# Patient Record
Sex: Male | Born: 1965 | Race: Black or African American | Hispanic: No | Marital: Married | Smoking: Never smoker
Health system: Southern US, Community
[De-identification: ages and names within clinical notes are randomized; demographics above are authoritative.]

## PROBLEM LIST (undated history)

## (undated) DIAGNOSIS — F32A Depression, unspecified: Secondary | ICD-10-CM

## (undated) DIAGNOSIS — E785 Hyperlipidemia, unspecified: Secondary | ICD-10-CM

## (undated) DIAGNOSIS — F329 Major depressive disorder, single episode, unspecified: Secondary | ICD-10-CM

## (undated) DIAGNOSIS — E119 Type 2 diabetes mellitus without complications: Secondary | ICD-10-CM

## (undated) DIAGNOSIS — I10 Essential (primary) hypertension: Secondary | ICD-10-CM

## (undated) DIAGNOSIS — T7840XA Allergy, unspecified, initial encounter: Secondary | ICD-10-CM

## (undated) DIAGNOSIS — G4733 Obstructive sleep apnea (adult) (pediatric): Secondary | ICD-10-CM

## (undated) DIAGNOSIS — I119 Hypertensive heart disease without heart failure: Secondary | ICD-10-CM

## (undated) DIAGNOSIS — J449 Chronic obstructive pulmonary disease, unspecified: Secondary | ICD-10-CM

## (undated) DIAGNOSIS — I429 Cardiomyopathy, unspecified: Secondary | ICD-10-CM

## (undated) DIAGNOSIS — I5042 Chronic combined systolic (congestive) and diastolic (congestive) heart failure: Secondary | ICD-10-CM

## (undated) DIAGNOSIS — G4730 Sleep apnea, unspecified: Secondary | ICD-10-CM

## (undated) DIAGNOSIS — J45909 Unspecified asthma, uncomplicated: Secondary | ICD-10-CM

## (undated) HISTORY — DX: Sleep apnea, unspecified: G47.30

## (undated) HISTORY — DX: Type 2 diabetes mellitus without complications: E11.9

## (undated) HISTORY — DX: Major depressive disorder, single episode, unspecified: F32.9

## (undated) HISTORY — DX: Allergy, unspecified, initial encounter: T78.40XA

## (undated) HISTORY — DX: Chronic obstructive pulmonary disease, unspecified: J44.9

## (undated) HISTORY — DX: Obstructive sleep apnea (adult) (pediatric): G47.33

## (undated) HISTORY — DX: Unspecified asthma, uncomplicated: J45.909

## (undated) HISTORY — DX: Essential (primary) hypertension: I10

## (undated) HISTORY — DX: Depression, unspecified: F32.A

---

## 2002-01-14 HISTORY — PX: HERNIA REPAIR: SHX51

## 2014-01-14 DIAGNOSIS — I429 Cardiomyopathy, unspecified: Secondary | ICD-10-CM

## 2014-01-14 HISTORY — DX: Cardiomyopathy, unspecified: I42.9

## 2014-02-06 ENCOUNTER — Inpatient Hospital Stay: Payer: Self-pay | Admitting: Internal Medicine

## 2014-02-06 LAB — BASIC METABOLIC PANEL
Anion Gap: 5 — ABNORMAL LOW (ref 7–16)
BUN: 15 mg/dL (ref 7–18)
CALCIUM: 8.5 mg/dL (ref 8.5–10.1)
Chloride: 107 mmol/L (ref 98–107)
Co2: 30 mmol/L (ref 21–32)
Creatinine: 1.16 mg/dL (ref 0.60–1.30)
Glucose: 141 mg/dL — ABNORMAL HIGH (ref 65–99)
OSMOLALITY: 286 (ref 275–301)
Potassium: 3.4 mmol/L — ABNORMAL LOW (ref 3.5–5.1)
Sodium: 142 mmol/L (ref 136–145)

## 2014-02-06 LAB — CK-MB
CK-MB: 1.8 ng/mL (ref 0.5–3.6)
CK-MB: 2 ng/mL (ref 0.5–3.6)

## 2014-02-06 LAB — CBC
HCT: 39.3 % — AB (ref 40.0–52.0)
HGB: 12.2 g/dL — ABNORMAL LOW (ref 13.0–18.0)
MCH: 26.8 pg (ref 26.0–34.0)
MCHC: 31.2 g/dL — ABNORMAL LOW (ref 32.0–36.0)
MCV: 86 fL (ref 80–100)
Platelet: 259 10*3/uL (ref 150–440)
RBC: 4.56 10*6/uL (ref 4.40–5.90)
RDW: 16.2 % — AB (ref 11.5–14.5)
WBC: 10.7 10*3/uL — ABNORMAL HIGH (ref 3.8–10.6)

## 2014-02-06 LAB — PRO B NATRIURETIC PEPTIDE: B-TYPE NATIURETIC PEPTID: 1584 pg/mL — AB (ref 0–125)

## 2014-02-06 LAB — TROPONIN I
TROPONIN-I: 0.07 ng/mL — AB
Troponin-I: 0.09 ng/mL — ABNORMAL HIGH

## 2014-02-07 LAB — CBC WITH DIFFERENTIAL/PLATELET
BASOS PCT: 0.8 %
Basophil #: 0.1 10*3/uL (ref 0.0–0.1)
EOS PCT: 1.8 %
Eosinophil #: 0.2 10*3/uL (ref 0.0–0.7)
HCT: 37.1 % — AB (ref 40.0–52.0)
HGB: 11.9 g/dL — ABNORMAL LOW (ref 13.0–18.0)
Lymphocyte #: 1.4 10*3/uL (ref 1.0–3.6)
Lymphocyte %: 14.8 %
MCH: 27.4 pg (ref 26.0–34.0)
MCHC: 32.1 g/dL (ref 32.0–36.0)
MCV: 86 fL (ref 80–100)
MONO ABS: 0.8 x10 3/mm (ref 0.2–1.0)
Monocyte %: 8.4 %
NEUTROS ABS: 7.2 10*3/uL — AB (ref 1.4–6.5)
NEUTROS PCT: 74.2 %
Platelet: 262 10*3/uL (ref 150–440)
RBC: 4.34 10*6/uL — AB (ref 4.40–5.90)
RDW: 16.1 % — ABNORMAL HIGH (ref 11.5–14.5)
WBC: 9.8 10*3/uL (ref 3.8–10.6)

## 2014-02-07 LAB — COMPREHENSIVE METABOLIC PANEL
Albumin: 3.4 g/dL (ref 3.4–5.0)
Alkaline Phosphatase: 79 U/L
Anion Gap: 7 (ref 7–16)
BUN: 14 mg/dL (ref 7–18)
Bilirubin,Total: 0.6 mg/dL (ref 0.2–1.0)
Calcium, Total: 8.6 mg/dL (ref 8.5–10.1)
Chloride: 103 mmol/L (ref 98–107)
Co2: 31 mmol/L (ref 21–32)
Creatinine: 1.24 mg/dL (ref 0.60–1.30)
GLUCOSE: 115 mg/dL — AB (ref 65–99)
Osmolality: 283 (ref 275–301)
Potassium: 3.2 mmol/L — ABNORMAL LOW (ref 3.5–5.1)
SGOT(AST): 25 U/L (ref 15–37)
SGPT (ALT): 28 U/L
Sodium: 141 mmol/L (ref 136–145)
TOTAL PROTEIN: 7.7 g/dL (ref 6.4–8.2)

## 2014-02-07 LAB — TROPONIN I: Troponin-I: 0.1 ng/mL — ABNORMAL HIGH

## 2014-02-07 LAB — CK-MB: CK-MB: 1.9 ng/mL (ref 0.5–3.6)

## 2014-02-08 LAB — LIPID PANEL
CHOLESTEROL: 161 mg/dL (ref 0–200)
HDL Cholesterol: 33 mg/dL — ABNORMAL LOW (ref 40–60)
LDL CHOLESTEROL, CALC: 112 mg/dL — AB (ref 0–100)
TRIGLYCERIDES: 79 mg/dL (ref 0–200)
VLDL CHOLESTEROL, CALC: 16 mg/dL (ref 5–40)

## 2014-02-15 ENCOUNTER — Ambulatory Visit: Payer: Self-pay | Admitting: Family

## 2014-02-17 ENCOUNTER — Ambulatory Visit: Payer: Self-pay | Admitting: Family

## 2014-02-24 DIAGNOSIS — Z8709 Personal history of other diseases of the respiratory system: Secondary | ICD-10-CM | POA: Insufficient documentation

## 2014-03-02 ENCOUNTER — Ambulatory Visit: Payer: Self-pay | Admitting: Family

## 2014-04-13 ENCOUNTER — Ambulatory Visit
Admit: 2014-04-13 | Disposition: A | Discharge status: Home / Self Care | Payer: Self-pay | Attending: Family | Admitting: Family

## 2014-04-26 DIAGNOSIS — I5022 Chronic systolic (congestive) heart failure: Secondary | ICD-10-CM

## 2014-04-26 DIAGNOSIS — I509 Heart failure, unspecified: Secondary | ICD-10-CM | POA: Insufficient documentation

## 2014-04-28 ENCOUNTER — Encounter
Admit: 2014-04-28 | Disposition: A | Discharge status: Home / Self Care | Payer: Self-pay | Attending: Internal Medicine | Admitting: Internal Medicine

## 2014-05-15 NOTE — H&P (Signed)
PATIENT NAME:  Bradley Mckenzie, Bradley Mckenzie MR#:  [id number] DATE OF BIRTH:  1965/07/05  REFERRING PHYSICIAN: Earleen Newport, MD, in the Emergency Room.   FAMILY PHYSICIAN: None.   REASON FOR ADMISSION: Accelerated hypertension with diastolic CHF.   HISTORY OF PRESENT ILLNESS: The patient is a 49 year old male with a history of untreated hypertension who presents to the Emergency Room today with worsening shortness of breath and peripheral edema. In the Emergency Room, the patient was noted to be malignantly hypertensive with CHF noted on exam. A troponin was also mildly elevated. He denies chest pain. He is now admitted for further evaluation.   PAST MEDICAL HISTORY:  Benign hypertension.   MEDICATIONS: None.   ALLERGIES: No known drug allergies.   SOCIAL HISTORY: Negative for alcohol or tobacco abuse.   FAMILY HISTORY: Positive for hypertension and stroke. Negative for colon or prostate cancer.   REVIEW OF SYSTEMS:  CONSTITUTIONAL: No fever, although he has had some weight gain.  EYES: No blurred or double vision. No glaucoma.  EARS, NOSE, THROAT: No tinnitus or hearing loss. No nasal discharge or bleeding. No difficulty swallowing.  RESPIRATORY: No cough or wheezing. Denies hemoptysis. No painful respiration.  CARDIOVASCULAR: No chest pain or palpitations. No syncope. Minimal orthopnea.  GASTROINTESTINAL: No nausea, vomiting, or diarrhea. No abdominal pain. No change in bowel habits.  GENITOURINARY: No dysuria or hematuria. No incontinence.  ENDOCRINE: No polyuria or polydipsia. No heat or cold intolerance.  HEMATOLOGIC: The patient denies anemia, easy bruising, or bleeding.  LYMPHATIC: No swollen glands.  MUSCULOSKELETAL: The patient denies pain in his neck, back, shoulders, knees, hips. No gout.  NEUROLOGIC: No numbness or migraines. Denies stroke or seizures.  PSYCHOLOGIC: The patient denies anxiety, insomnia, or depression.   PHYSICAL EXAMINATION: GENERAL: The patient is in no acute  distress.  VITAL SIGNS: Currently remarkable for a blood pressure of 212/106, with a heart rate of 106, respiratory rate of 26, temperature of 98.7, saturating 95% on room air.  HEENT: Normocephalic, atraumatic. Pupils equally round and reactive to light and accommodation, extraocular movements are intact. Sclerae are nonicteric. Conjunctivae are clear. Oropharynx is clear.  NECK: Supple without JVD. No adenopathy or thyromegaly was noted.  LUNGS: Reveal basilar rales bilaterally without wheezes or rhonchi. No dullness. Respiratory effort is normal.  CARDIAC: Rapid rate with a regular rhythm. Normal S1 and S2. There is a 2/6 systolic ejection murmur noted. No rubs or gallops are present. PMI is nondisplaced. Chest wall is nontender.  ABDOMEN: Soft, nontender, with normoactive bowel sounds. No organomegaly or masses were appreciated. No hernias or bruits were noted.  EXTREMITIES: Revealed 1+ edema. Pulses were 2+ bilaterally.  SKIN: Warm and dry without rash or lesions.  NEUROLOGIC: Cranial nerves II-XII grossly intact. Deep tendon reflexes were symmetric. Motor and sensory exam nonfocal.  PSYCHIATRIC: Revealed a patient who was alert and oriented to person, place, and time. He was cooperative and used good judgment.   LABORATORY DATA: His troponin was 0.07. Glucose 141 with a BUN of 15, creatinine of 1.16, and a sodium of 142 with a potassium of 3.4. BNP was 1584. White count was 10.7 with a hemoglobin of 12.2.   EKG revealed sinus tachycardia with T wave inversion laterally. Chest x-ray revealed cardiomegaly with pulmonary edema.   ASSESSMENT: 1.  Malignant hypertension.  2.  Acute diastolic congestive heart failure.  3.  Elevated troponin.  4.  Hypokalemia.  5.  Peripheral edema.   PLAN: The patient will be admitted to telemetry with  IV Lasix, topical nitrates, and will begin losartan and beta blocker. Will use IV hydralazine as needed for hypertension. We will follow serial cardiac enzymes  and obtain an echocardiogram. Will consult cardiology. Followup routine labs in the morning. Supplement potassium at this time. Two gram sodium diet. Further treatment and evaluation will depend upon the patient's progress.   TOTAL TIME SPENT ON THIS PATIENT: 50 minutes.    ____________________________ Leonie Douglas Doy Hutching, MD jds:LT D: 02/06/2014 17:20:56 ET T: 02/06/2014 18:13:39 ET JOB#: [id number]  cc: Leonie Douglas. Doy Hutching, MD, <Dictator> Hayward Rylander Lennice Sites MD ELECTRONICALLY SIGNED 02/06/2014 19:47

## 2014-05-15 NOTE — Discharge Summary (Signed)
PATIENT NAME:  Bradley Mckenzie, Bradley Mckenzie MR#:  [id number] DATE OF BIRTH:  [birth year]  DATE OF ADMISSION:  02/06/2014 DATE OF DISCHARGE:  02/08/2014  DISCHARGE DIAGNOSES: Acute-on-chronic systolic heart failure, hyperlipidemia, obesity.   DISCHARGE MEDICATIONS: Qvar 40 mcg 2 puffs b.i.d., albuterol 2 puffs every 4 hours as needed, lisinopril 5 mg p.o. daily, aspirin 81 mg, Coreg 3.125 mg p.o. b.i.d., furosemide 40 mg p.o. daily, KCl 10 mEq p.o. b.i.d., pravastatin 40 mg p.o. daily.   DIET: Low-sodium, low-fat diet.   CONSULTATIONS: Dr. Nehemiah Massed from cardiology.   HOSPITAL COURSE: A 49 year old male patient with history of obesity and sleep apnea, comes in because of shortness of breath and pedal edema. The patient used to take medications for blood pressure but could not take it because of no insurance and cost issues. The patient had worsening edema of the legs with trouble breathing. The patient was admitted for congestive heart failure exacerbation. Started on IV Lasix and monitored on telemetry. The patient was given 40 mg IV Lasix q. 12 hours. BNP on admission was 1584. The patient's EKG showed sinus tachycardia with left atrial enlargement. Anterior  abnormalities in lateral leads. The patient's symptoms improved with IV Lasix. EKG showed T wave inversions in V4, V5, V6. The patient's troponins were slightly up at 0.07. His echocardiogram showed EF of 25%-30%. Dr. Nehemiah Massed said it could be secondary to due to sleep apnea and also not taking medications for his heart failure. Patient advised to continue CPAP. He said that he CPAP machine is in storage in [place] and there is no that he can get it and explained that he cannot afford the CPAP here. The patient was asked if he needs help with Education officer, museum; he declined any help. The patient advised to follow with Dr. Nehemiah Massed regarding possible stress test to evaluate for chorea. The patient's kidney function stayed stable. CBC and chem 7  were within normal  limits. Hypokalemia, which was 3.2 potassium, and he got potassium replacement in the hospital. The patient also was given Lasix 40 mg daily and KCL 10 mEq p.o. b.i.d. He has LDL of 112, so we gave him a prescription for pravastatin.   DISCHARGE PHYSICAL EXAMINATION: VITAL SIGNS: Temperature 98.2, heart rate 79, blood pressure 139/82, saturation is 96% on room air.  CARDIOVASCULAR: S1, S2 regular.  LUNGS: Clear to auscultation. No wheeze. No rales.  ABDOMEN: Soft, nontender, nondistended. Bowel sounds present. The patient does have morbid obesity with a BMI of 41.5; advised him to lose weight.  TIME SPENT ON DISCHARGE EVALUATION: More 30 minutes.    ____________________________ Epifanio Lesches, MD sk:bm D: 02/12/2014 16:35:22 ET T: 02/13/2014 04:06:04 ET JOB#: [id number]  cc: Epifanio Lesches, MD, <Dictator> Corey Skains, MD  Epifanio Lesches MD ELECTRONICALLY SIGNED 02/15/2014 13:44

## 2014-05-15 NOTE — Consult Note (Signed)
PATIENT NAME:  Bradley Mckenzie, Bradley Mckenzie MR#:  [id number] DATE OF BIRTH:  11/11/1965  DATE OF CONSULTATION:  02/07/2014  REFERRING PHYSICIAN:   CONSULTING PHYSICIAN:  Corey Skains, MD  CONSULTING PHYSICIAN:  Leonie Douglas. Doy Hutching, M.D.   REASON FOR CONSULTATION: Diastolic heart failure, hypertension, elevated troponin, sleep apnea.   CHIEF COMPLAINT: "I got short of breath."   HISTORY OF PRESENT ILLNESS: This is a 49 year old male with known hypertension, unable to use medications for this due to no insurance and cost issues. The patient has had recent progressive shortness of breath, weakness, fatigue, lower extremity edema over the last 4 to 5 days significantly increasing where he was seen in the Emergency Room. At that time, he had lower extremity edema and pulmonary edema by chest x-ray consistent with diastolic dysfunction congestive heart failure. The patient did have an elevated   and elevated troponin of 0.1, most consistent with demand ischemia and hypoxia rather than acute coronary syndrome. The patient also has had a continued issues with sleep apnea for which it appears a clinical diagnosis has been made, for which he snores at night, stops breathing, wakes up with a headache, is weak and fatigued in the morning and falls asleep during the day. Currently, the patient does have improvements of symptoms with intravenous Lasix with an EKG showing sinus tachycardia with nonspecific ST and T wave changes.   REVIEW OF SYSTEMS: The remainder review of systems is negative for vision change, ringing in the ears, hearing loss, heartburn, nausea, vomiting, diarrhea, bloody stools, stomach pain, extremity pain, leg weakness, cramping of the buttocks, known blood clots, positive for headaches, no blackouts, dizzy spells, nosebleeds, congestion, trouble swallowing, frequent urination, urination at night, muscle weakness, numbness, anxiety, depression, skin lesions or skin rashes.   PAST MEDICAL  HISTORY: 1. Essential hypertension. 2. Mixed hyperlipidemia.   FAMILY HISTORY: No family members with early onset of cardiovascular disease or hypertension.   SOCIAL HISTORY: He currently denies tobacco use, occasionally drinking alcohol.   ALLERGIES TO MEDICATIONS: AS LISTED.   PHYSICAL EXAMINATION:  VITAL SIGNS: Blood pressure is 148/62 bilaterally. Heart rate is 72 upright, reclining, and irregular.  GENERAL: He is a well appearing male in no acute distress.  HEENT: No icterus, thyromegaly, ulcers, hemorrhage, or xanthelasma.  CARDIOVASCULAR: Regular rate and rhythm. Normal S1 and S2, distant heart sounds. PMI is diffuse. Carotid upstroke normal without bruit. Jugular venous pressure is normal.  LUNGS: Have bibasilar crackles with normal respirations.  ABDOMEN: Soft. Cannot assess hepatosplenomegaly due to increased abdominal girth.  EXTREMITIES: Show 2+ radial, trace femoral and dorsal pedal pulses, with trace lower extremity edema. No cyanosis, clubbing or ulcers.  NEUROLOGIC: He is oriented to time, place, and person, with normal mood and affect.   ASSESSMENT: A 49 year old male with acute diastolic dysfunction congestive heart failure, elevated troponin consistent with demand ischemia and malignant hypertension with mixed hyperlipidemia and sleep apnea.   RECOMMENDATIONS: 1. Further evaluation of extent of sleep apnea with treatment with possible CPAP for diastolic heart failure.  2. Intravenous Lasix and change to oral Lasix as able for further risk reduction and diastolic heart failure.  3. ACE inhibitor for hypertension control with possible beta blocker, as well, for diastolic heart failure with a goal systolic blood pressure below 130 mm.  4. Echocardiogram for LV systolic dysfunction, valvular heart disease contributing to diastolic heart failure.  5. No further intervention of elevated troponin consistent with demand ischemia rather than acute coronary syndrome.  6. Further  evaluation  of extent of mixed hyperlipidemia. Need   treatment for moderate intensive cholesterol therapy.    ____________________________ Corey Skains, MD bjk:JT D: 02/07/2014 10:11:26 ET T: 02/07/2014 10:26:58 ET JOB#: [id number]  cc: Corey Skains, MD, <Dictator> Corey Skains MD ELECTRONICALLY SIGNED 02/11/2014 8:36

## 2014-05-16 ENCOUNTER — Ambulatory Visit: Payer: Self-pay

## 2014-05-18 ENCOUNTER — Ambulatory Visit: Payer: Self-pay

## 2014-05-19 ENCOUNTER — Ambulatory Visit: Payer: Self-pay

## 2014-05-19 ENCOUNTER — Telehealth: Payer: Self-pay | Admitting: *Deleted

## 2014-05-23 ENCOUNTER — Ambulatory Visit: Payer: Self-pay

## 2014-05-25 ENCOUNTER — Ambulatory Visit: Payer: Self-pay

## 2014-05-26 ENCOUNTER — Ambulatory Visit: Payer: Self-pay

## 2014-05-26 NOTE — Telephone Encounter (Signed)
I have not heard back from patient when I called him the other day. See Contact Telephone Call notes that he was called earlier last week. Unable to reach him so contacted [facility] to see if they can reach him.

## 2014-05-30 ENCOUNTER — Ambulatory Visit: Payer: Self-pay

## 2014-06-01 ENCOUNTER — Ambulatory Visit: Payer: Self-pay

## 2014-06-01 ENCOUNTER — Ambulatory Visit: Payer: Self-pay | Admitting: Family

## 2014-06-02 ENCOUNTER — Ambulatory Visit: Payer: Self-pay

## 2014-06-06 ENCOUNTER — Ambulatory Visit: Payer: Self-pay

## 2014-06-07 ENCOUNTER — Encounter: Payer: Self-pay | Admitting: *Deleted

## 2014-06-07 NOTE — Progress Notes (Signed)
Cardiac Individual Treatment Plan  Patient Details  Name: Bradley Mckenzie. MRN: [id number] Date of Birth: January 23, 1965 Referring Provider:Dr. B. Nehemiah Massed Initial Encounter Date:  04/28/2014 Diagnosis CHF  Patient's Home Medications on Admission:  Current outpatient prescriptions:  .  albuterol (PROVENTIL HFA;VENTOLIN HFA) 108 (90 BASE) MCG/ACT inhaler, Inhale 2 puffs into the lungs every 6 (six) hours as needed for wheezing or shortness of breath., Disp: , Rfl:  .  aspirin 81 MG tablet, Take 81 mg by mouth daily., Disp: , Rfl:  .  beclomethasone (QVAR) 40 MCG/ACT inhaler, Inhale 2 puffs into the lungs daily., Disp: , Rfl:  .  carvedilol (COREG) 6.25 MG tablet, Take 6.25 mg by mouth 2 (two) times daily with a meal., Disp: , Rfl:  .  furosemide (LASIX) 40 MG tablet, Take 40 mg by mouth daily., Disp: , Rfl:  .  isosorbide-hydrALAZINE (BIDIL) 20-37.5 MG per tablet, Take 1 tablet by mouth 3 (three) times daily., Disp: , Rfl:  .  lisinopril (PRINIVIL,ZESTRIL) 10 MG tablet, Take 10 mg by mouth daily., Disp: , Rfl:  .  potassium chloride (K-DUR,KLOR-CON) 10 MEQ tablet, Take 10 mEq by mouth 2 (two) times daily., Disp: , Rfl:  .  simvastatin (ZOCOR) 40 MG tablet, Take 40 mg by mouth daily., Disp: , Rfl:   Past Medical History: Past Medical History  Diagnosis Date  . Hypertension   . CHF (congestive heart failure)   . Diabetes mellitus without complication   . Obstructive sleep apnea   . Depression     Tobacco Use: History  Smoking status  . Not on file  Smokeless tobacco  . Never Used    Labs: Recent Review Flowsheet Data    There is no flowsheet data to display.       Exercise Target Goals:    Exercise Program Goal: Individual exercise prescription set with THRR, safety & activity barriers. Participant demonstrates ability to understand and report RPE using BORG scale, to self-measure pulse accurately, and to acknowledge the importance of the exercise  prescription.  Exercise Prescription Goal: Starting with aerobic activity 30 plus minutes a day, 3 days per week for initial exercise prescription. Provide home exercise prescription and guidelines that participant acknowledges understanding prior to discharge.  Activity Barriers & Risk Stratification:   6 Minute Walk:   Initial Exercise Prescription:   Exercise Prescription Changes:     Exercise Prescription Changes      06/06/14 1200           Exercise Review   Progression No       Response to Exercise   Comments Patient has not attened an exercise session since intial orientation appt.          Discharge Exercise Prescription:   Nutrition:  Target Goals: Understanding of nutrition guidelines, daily intake of sodium 1500mg , cholesterol 200mg , calories 30% from fat and 7% or less from saturated fats, daily to have 5 or more servings of fruits and vegetables.  Biometrics:    Nutrition Therapy Plan and Nutrition Goals:   Nutrition Discharge: Rate Your Plate Scores:   Nutrition Goals Re-Evaluation:   Psychosocial: Target Goals: Acknowledge presence or absence of depression, maximize coping skills, provide positive support system. Participant is able to verbalize types and ability to use techniques and skills needed for reducing stress and depression.  Initial Review & Psychosocial Screening:   Quality of Life Scores:   PHQ-9:     Recent Review Flowsheet Data    There is no flowsheet  data to display.      Psychosocial Evaluation and Intervention:   Psychosocial Re-Evaluation:   Vocational Rehabilitation: Provide vocational rehab assistance to qualifying candidates.   Vocational Rehab Evaluation & Intervention:   Education: Education Goals: Education classes will be provided on a weekly basis, covering required topics. Participant will state understanding/return demonstration of topics presented.  Learning  Barriers/Preferences:   Education Topics: General Nutrition Guidelines/Fats and Fiber: -Group instruction provided by verbal, written material, models and posters to present the general guidelines for heart healthy nutrition. Gives an explanation and review of dietary fats and fiber.   Controlling Sodium/Reading Food Labels: -Group verbal and written material supporting the discussion of sodium use in heart healthy nutrition. Review and explanation with models, verbal and written materials for utilization of the food label.   Exercise Physiology & Risk Factors: - Group verbal and written instruction with models to review the exercise physiology of the cardiovascular system and associated critical values. Details cardiovascular disease risk factors and the goals associated with each risk factor.   Aerobic Exercise & Resistance Training: - Gives group verbal and written discussion on the health impact of inactivity. On the components of aerobic and resistive training programs and the benefits of this training and how to safely progress through these programs.   Flexibility, Balance, General Exercise Guidelines: - Provides group verbal and written instruction on the benefits of flexibility and balance training programs. Provides general exercise guidelines with specific guidelines to those with heart or lung disease. Demonstration and skill practice provided.   Stress Management: - Provides group verbal and written instruction about the health risks of elevated stress, cause of high stress, and healthy ways to reduce stress.   Depression: - Provides group verbal and written instruction on the correlation between heart/lung disease and depressed mood, treatment options, and the stigmas associated with seeking treatment.   Anatomy & Physiology of the Heart: - Group verbal and written instruction and models provide basic cardiac anatomy and physiology, with the coronary electrical and  arterial systems. Review of: AMI, Angina, Valve disease, Heart Failure, Cardiac Arrhythmia, Pacemakers, and the ICD.   Cardiac Procedures: - Group verbal and written instruction and models to describe the testing methods done to diagnose heart disease. Reviews the outcomes of the test results. Describes the treatment choices: Medical Management, Angioplasty, or Coronary Bypass Surgery.   Cardiac Medications: - Group verbal and written instruction to review commonly prescribed medications for heart disease. Reviews the medication, class of the drug, and side effects. Includes the steps to properly store meds and maintain the prescription regimen.   Go Sex-Intimacy & Heart Disease, Get SMART - Goal Setting: - Group verbal and written instruction through game format to discuss heart disease and the return to sexual intimacy. Provides group verbal and written material to discuss and apply goal setting through the application of the S.M.A.R.T. Method.   Other Matters of the Heart: - Provides group verbal, written materials and models to describe Heart Failure, Angina, Valve Disease, and Diabetes in the realm of heart disease. Includes description of the disease process and treatment options available to the cardiac patient.   Exercise & Equipment Safety: - Individual verbal instruction and demonstration of equipment use and safety with use of the equipment.   Infection Prevention: - Provides verbal and written material to individual with discussion of infection control including proper hand washing and proper equipment cleaning during exercise session.   Falls Prevention: - Provides verbal and written material to individual with  discussion of falls prevention and safety.   Diabetes: - Individual verbal and written instruction to review signs/symptoms of diabetes, desired ranges of glucose level fasting, after meals and with exercise. Advice that pre and post exercise glucose checks will be  done for 3 sessions at entry of program.    Knowledge Questionnaire Score:   Personal Goals and Risk Factors at Admission:   Personal Goals and Risk Factors Review:    Personal Goals Discharge:     Comments: 30 day review  Orientation completed.  To start program soon.

## 2014-06-08 ENCOUNTER — Ambulatory Visit: Payer: Self-pay

## 2014-06-09 ENCOUNTER — Other Ambulatory Visit: Payer: Self-pay | Admitting: *Deleted

## 2014-06-09 ENCOUNTER — Ambulatory Visit: Payer: Self-pay

## 2014-06-09 DIAGNOSIS — I509 Heart failure, unspecified: Secondary | ICD-10-CM

## 2014-06-15 ENCOUNTER — Ambulatory Visit: Payer: Self-pay

## 2014-06-16 ENCOUNTER — Ambulatory Visit: Payer: Self-pay

## 2014-06-16 ENCOUNTER — Ambulatory Visit: Payer: No Typology Code available for payment source | Attending: Family | Admitting: Family

## 2014-06-16 ENCOUNTER — Encounter: Payer: Self-pay | Admitting: Family

## 2014-06-16 VITALS — BP 148/79 | HR 90 | Resp 20 | Ht 69.0 in | Wt 286.0 lb

## 2014-06-16 DIAGNOSIS — M549 Dorsalgia, unspecified: Secondary | ICD-10-CM | POA: Diagnosis not present

## 2014-06-16 DIAGNOSIS — I10 Essential (primary) hypertension: Secondary | ICD-10-CM | POA: Insufficient documentation

## 2014-06-16 DIAGNOSIS — G4733 Obstructive sleep apnea (adult) (pediatric): Secondary | ICD-10-CM | POA: Diagnosis not present

## 2014-06-16 DIAGNOSIS — E119 Type 2 diabetes mellitus without complications: Secondary | ICD-10-CM | POA: Diagnosis not present

## 2014-06-16 DIAGNOSIS — I5022 Chronic systolic (congestive) heart failure: Secondary | ICD-10-CM | POA: Insufficient documentation

## 2014-06-16 DIAGNOSIS — M25559 Pain in unspecified hip: Secondary | ICD-10-CM | POA: Diagnosis not present

## 2014-06-16 DIAGNOSIS — I509 Heart failure, unspecified: Secondary | ICD-10-CM | POA: Diagnosis present

## 2014-06-16 MED ORDER — SACUBITRIL-VALSARTAN 24-26 MG PO TABS: 1.0000 | ORAL_TABLET | Freq: Two times a day (BID) | ORAL | Status: DC

## 2014-06-16 NOTE — Progress Notes (Signed)
Subjective:    Patient ID: Bradley Mckenzie., male    DOB: [birth year], 49 y.o.   MRN: [id number]  Shortness of Breath This is a chronic problem. The current episode started more than 1 year ago. The problem occurs daily. The problem has been unchanged. Associated symptoms include orthopnea (sleeping in recliner or with 5-6 pillows in the bed). Pertinent negatives include no abdominal pain, chest pain, headaches, PND, sore throat or wheezing. The symptoms are aggravated by any activity and lying flat. He has tried body position changes and steroid inhalers for the symptoms. The treatment provided mild relief. His past medical history is significant for a heart failure.  Back Pain This is a recurrent problem. The current episode started in the past 7 days. The problem occurs intermittently. The problem has been gradually improving since onset. The pain is present in the lumbar spine. The quality of the pain is described as aching and burning. The pain does not radiate. The pain is moderate. The pain is the same all the time. The symptoms are aggravated by position. Stiffness is present all day. Pertinent negatives include no abdominal pain, bladder incontinence, bowel incontinence, chest pain, headaches, numbness or paresthesias. Risk factors include lack of exercise and obesity. He has tried muscle relaxant for the symptoms. The treatment provided mild relief.  Hip Pain  The incident occurred 5 to 7 days ago. The incident occurred at home. There was no injury mechanism. The pain is present in the left hip and right hip. The quality of the pain is described as aching. The pain is mild. The pain has been intermittent since onset. Pertinent negatives include no inability to bear weight, loss of sensation or numbness. He reports no foreign bodies present. The symptoms are aggravated by movement. He has tried nothing for the symptoms.      Review of Systems  Constitutional: Positive for fatigue. Negative  for appetite change.  HENT: Positive for congestion. Negative for sore throat.   Eyes: Negative.   Respiratory: Positive for shortness of breath. Negative for cough and wheezing.   Cardiovascular: Positive for orthopnea (sleeping in recliner or with 5-6 pillows in the bed). Negative for chest pain and PND.  Gastrointestinal: Negative.  Negative for abdominal pain and bowel incontinence.  Endocrine: Negative.   Genitourinary: Negative.  Negative for bladder incontinence.  Musculoskeletal: Positive for back pain and arthralgias (hip pain). Negative for neck stiffness.  Skin: Negative.   Allergic/Immunologic: Negative.   Neurological: Negative for dizziness, numbness, headaches and paresthesias.  Hematological: Negative.   Psychiatric/Behavioral: Positive for sleep disturbance. The patient is not nervous/anxious.        Objective:   Physical Exam  Constitutional: He is oriented to person, place, and time. He appears well-developed and well-nourished.  HENT:  Head: Normocephalic and atraumatic.  Eyes: Conjunctivae are normal. Pupils are equal, round, and reactive to light.  Neck: Normal range of motion. Neck supple.  Cardiovascular: Normal rate and regular rhythm.   No murmur heard. Pulmonary/Chest: Effort normal and breath sounds normal. He has no rales.  Abdominal: Soft. He exhibits no distension. There is no tenderness.  Musculoskeletal: He exhibits edema (1+ pitting edema in bilateral lower legs). He exhibits no tenderness.  Neurological: He is alert and oriented to person, place, and time.  Skin: Skin is warm and dry.  Psychiatric: He has a normal mood and affect. His behavior is normal. Thought content normal.  Nursing note and vitals reviewed.  Assessment & Plan:  1: Chronic heart failure with reduced ejection fraction- Patient presents with continued shortness of breath, fatigue and intermittent edema. Shortness of breath and fatigue occur with minimal exertion  (Class III) He says that he still sleeps in the recliner or occasionally in the bed with 5-6 pillows. He continues to weigh himself and reports a steady weight. He is not adding any salt to his food but says that he hasn't been making good dietary choices lately because of the hours that he's working. He also admits to not taking his medications consistently, again, because of his job. Discussed ways that could help him take his medications consistently and the importance of taking them as scheduled. He says that he'll take a pill box and put the pill box in a cooler and leave in his car as he can't take it into his work with him. He has insurance now so will stop his lisinopril and begin entresto 24/26mg  twice daily. Reviewed instructions on how to begin entresto once his pharmacy gets it in. Will check chemistry panel at his next visit.  2: Obstructive sleep apnea- Now that he has insurance, will go ahead and order his sleep study. He says it's probably been about 6 years since he's had his CPAP machine. Discussed that his blood pressure, fatigue and glucose levels should hopefully improve with resumption of treatment. 3: HTN- Blood pressure mildly elevated but this could be due to the inconsistency of his medication taking. Hopefully this will improve with taking his pillbox to work. 4: Diabetes- He says that he's "borderline" and is trying to control this by diet and exercise. He admits, however, that he hasn't been exercising due to his work schedule and that's why he hasn't been able to get to his cardiac rehab appointments. Encouraged him to be as active as he can be.  Return in 1 month or sooner for any questions/problems before then.

## 2014-06-16 NOTE — Patient Instructions (Signed)
When you get entresto picked up from the pharmacy, you will begin it approximately 36 hours after your last dose of lisinopril.  For example: if you took your last dose of lisinopril on a Monday morning, you would begin entresto on that Tuesday evening. You would only take 1 tablet on day 1 and then begin taking it twice daily after that. Will check lab work at your next visit.   Continue weighing daily and call for an overnight weight gain of > 2 pounds or a weekly weight gain of >5 pounds.

## 2014-06-20 ENCOUNTER — Ambulatory Visit: Payer: Self-pay

## 2014-06-22 ENCOUNTER — Ambulatory Visit: Payer: Self-pay

## 2014-06-23 ENCOUNTER — Ambulatory Visit: Payer: Self-pay

## 2014-06-27 ENCOUNTER — Ambulatory Visit: Payer: Self-pay

## 2014-06-29 ENCOUNTER — Ambulatory Visit: Payer: Self-pay

## 2014-06-30 ENCOUNTER — Ambulatory Visit: Payer: Self-pay

## 2014-07-04 ENCOUNTER — Ambulatory Visit: Payer: Self-pay

## 2014-07-05 ENCOUNTER — Encounter: Payer: Self-pay | Admitting: *Deleted

## 2014-07-05 ENCOUNTER — Telehealth: Payer: Self-pay | Admitting: *Deleted

## 2014-07-05 DIAGNOSIS — I5022 Chronic systolic (congestive) heart failure: Secondary | ICD-10-CM

## 2014-07-05 NOTE — Progress Notes (Signed)
Cardiac Individual Treatment Plan  Patient Details  Name: Bradley Mckenzie. MRN: [id number] Date of Birth: [birth year] Referring Provider:  Dr. Mauri Reading Initial Encounter Date:  04/28/2014  Visit Diagnosis: Chronic systolic heart failure  Patient's Home Medications on Admission:  Current outpatient prescriptions:  .  albuterol (PROVENTIL HFA;VENTOLIN HFA) 108 (90 BASE) MCG/ACT inhaler, Inhale 2 puffs into the lungs every 6 (six) hours as needed for wheezing or shortness of breath., Disp: , Rfl:  .  aspirin 81 MG tablet, Take 81 mg by mouth daily., Disp: , Rfl:  .  beclomethasone (QVAR) 40 MCG/ACT inhaler, Inhale 2 puffs into the lungs daily., Disp: , Rfl:  .  carvedilol (COREG) 6.25 MG tablet, Take 6.25 mg by mouth 2 (two) times daily with a meal., Disp: , Rfl:  .  furosemide (LASIX) 40 MG tablet, Take 40 mg by mouth daily., Disp: , Rfl:  .  isosorbide-hydrALAZINE (BIDIL) 20-37.5 MG per tablet, Take 1 tablet by mouth 3 (three) times daily., Disp: , Rfl:  .  potassium chloride (K-DUR,KLOR-CON) 10 MEQ tablet, Take 10 mEq by mouth 2 (two) times daily., Disp: , Rfl:  .  sacubitril-valsartan (ENTRESTO) 24-26 MG, Take 1 tablet by mouth 2 (two) times daily., Disp: 60 tablet, Rfl: 3 .  simvastatin (ZOCOR) 40 MG tablet, Take 40 mg by mouth daily., Disp: , Rfl:   Past Medical History: Past Medical History  Diagnosis Date  . Hypertension   . CHF (congestive heart failure)   . Diabetes mellitus without complication   . Obstructive sleep apnea   . Depression     Tobacco Use: History  Smoking status  . Former Smoker  Smokeless tobacco  . Never Used    Labs: Recent Review Flowsheet Data    There is no flowsheet data to display.       Exercise Target Goals:    Exercise Program Goal: Individual exercise prescription set with THRR, safety & activity barriers. Participant demonstrates ability to understand and report RPE using BORG scale, to self-measure pulse accurately, and to  acknowledge the importance of the exercise prescription.  Exercise Prescription Goal: Starting with aerobic activity 30 plus minutes a day, 3 days per week for initial exercise prescription. Provide home exercise prescription and guidelines that participant acknowledges understanding prior to discharge.  Activity Barriers & Risk Stratification:   6 Minute Walk:   Initial Exercise Prescription:   Exercise Prescription Changes:     Exercise Prescription Changes      06/06/14 1200 07/05/14 0700         Exercise Review   Progression No No  Patient has not started yet      Response to Exercise   Comments Patient has not attened an exercise session since intial orientation appt.          Discharge Exercise Prescription (Final Exercise Prescription Changes):     Exercise Prescription Changes - 07/05/14 0700    Exercise Review   Progression No  Patient has not started yet      Nutrition:  Target Goals: Understanding of nutrition guidelines, daily intake of sodium 1500mg , cholesterol 200mg , calories 30% from fat and 7% or less from saturated fats, daily to have 5 or more servings of fruits and vegetables.  Biometrics:    Nutrition Therapy Plan and Nutrition Goals:   Nutrition Discharge: Rate Your Plate Scores:   Nutrition Goals Re-Evaluation:   Psychosocial: Target Goals: Acknowledge presence or absence of depression, maximize coping skills, provide positive support system. Participant is  able to verbalize types and ability to use techniques and skills needed for reducing stress and depression.  Initial Review & Psychosocial Screening:   Quality of Life Scores:   PHQ-9:     Recent Review Flowsheet Data    Depression screen [facility] 2/9 06/16/2014 06/16/2014   Decreased Interest 0 0   Down, Depressed, Hopeless 0 0   PHQ - 2 Score 0 0      Psychosocial Evaluation and Intervention:   Psychosocial Re-Evaluation:   Vocational Rehabilitation: Provide  vocational rehab assistance to qualifying candidates.   Vocational Rehab Evaluation & Intervention:   Education: Education Goals: Education classes will be provided on a weekly basis, covering required topics. Participant will state understanding/return demonstration of topics presented.  Learning Barriers/Preferences:   Education Topics: General Nutrition Guidelines/Fats and Fiber: -Group instruction provided by verbal, written material, models and posters to present the general guidelines for heart healthy nutrition. Gives an explanation and review of dietary fats and fiber.   Controlling Sodium/Reading Food Labels: -Group verbal and written material supporting the discussion of sodium use in heart healthy nutrition. Review and explanation with models, verbal and written materials for utilization of the food label.   Exercise Physiology & Risk Factors: - Group verbal and written instruction with models to review the exercise physiology of the cardiovascular system and associated critical values. Details cardiovascular disease risk factors and the goals associated with each risk factor.   Aerobic Exercise & Resistance Training: - Gives group verbal and written discussion on the health impact of inactivity. On the components of aerobic and resistive training programs and the benefits of this training and how to safely progress through these programs.   Flexibility, Balance, General Exercise Guidelines: - Provides group verbal and written instruction on the benefits of flexibility and balance training programs. Provides general exercise guidelines with specific guidelines to those with heart or lung disease. Demonstration and skill practice provided.   Stress Management: - Provides group verbal and written instruction about the health risks of elevated stress, cause of high stress, and healthy ways to reduce stress.   Depression: - Provides group verbal and written instruction on  the correlation between heart/lung disease and depressed mood, treatment options, and the stigmas associated with seeking treatment.   Anatomy & Physiology of the Heart: - Group verbal and written instruction and models provide basic cardiac anatomy and physiology, with the coronary electrical and arterial systems. Review of: AMI, Angina, Valve disease, Heart Failure, Cardiac Arrhythmia, Pacemakers, and the ICD.   Cardiac Procedures: - Group verbal and written instruction and models to describe the testing methods done to diagnose heart disease. Reviews the outcomes of the test results. Describes the treatment choices: Medical Management, Angioplasty, or Coronary Bypass Surgery.   Cardiac Medications: - Group verbal and written instruction to review commonly prescribed medications for heart disease. Reviews the medication, class of the drug, and side effects. Includes the steps to properly store meds and maintain the prescription regimen.   Go Sex-Intimacy & Heart Disease, Get SMART - Goal Setting: - Group verbal and written instruction through game format to discuss heart disease and the return to sexual intimacy. Provides group verbal and written material to discuss and apply goal setting through the application of the S.M.A.R.T. Method.   Other Matters of the Heart: - Provides group verbal, written materials and models to describe Heart Failure, Angina, Valve Disease, and Diabetes in the realm of heart disease. Includes description of the disease process and treatment options available  to the cardiac patient.   Exercise & Equipment Safety: - Individual verbal instruction and demonstration of equipment use and safety with use of the equipment.   Infection Prevention: - Provides verbal and written material to individual with discussion of infection control including proper hand washing and proper equipment cleaning during exercise session.   Falls Prevention: - Provides verbal and  written material to individual with discussion of falls prevention and safety.   Diabetes: - Individual verbal and written instruction to review signs/symptoms of diabetes, desired ranges of glucose level fasting, after meals and with exercise. Advice that pre and post exercise glucose checks will be done for 3 sessions at entry of program.    Knowledge Questionnaire Score:   Personal Goals and Risk Factors at Admission:   Personal Goals and Risk Factors Review:    Personal Goals Discharge:     Comments: 30 day review. Continue with ITP.  Absent since medical review. Calls made to check on return status.

## 2014-07-05 NOTE — Telephone Encounter (Signed)
Called to check on status to return to program. Left message for Bradley Mckenzie to call [facility] to let us know when he will be able to return to program.

## 2014-07-06 ENCOUNTER — Other Ambulatory Visit: Payer: Self-pay | Admitting: *Deleted

## 2014-07-06 ENCOUNTER — Ambulatory Visit: Payer: Self-pay

## 2014-07-06 DIAGNOSIS — I5022 Chronic systolic (congestive) heart failure: Secondary | ICD-10-CM

## 2014-07-07 ENCOUNTER — Ambulatory Visit: Payer: Self-pay

## 2014-07-08 ENCOUNTER — Encounter: Payer: Self-pay | Admitting: *Deleted

## 2014-07-08 NOTE — Progress Notes (Unsigned)
Discharge Summary  Patient Details  Name: Makell Keizer. MRN: [id number] Date of Birth: 12/18/1965 Referring Provider:  No ref. provider found   Number of Visits: 1  Reason for Discharge:  Early Exit:  {CHL AMB CP REHAB EARLY EXIT:[phone]}  Smoking History:  History  Smoking status  . Former Smoker  Smokeless tobacco  . Never Used    Diagnosis:  No diagnosis found.  ADL UCSD:   Initial Exercise Prescription:   Discharge Exercise Prescription (Final Exercise Prescription Changes):     Exercise Prescription Changes - 07/05/14 0700    Exercise Review   Progression No  Patient has not started yet      Functional Capacity:   Psychological, QOL, Others - Outcomes: PHQ 2/9: Depression screen [facility] 2/9 06/16/2014 06/16/2014  Decreased Interest 0 0  Down, Depressed, Hopeless 0 0  PHQ - 2 Score 0 0    Quality of Life:   Personal Goals: Goals established at orientation with interventions provided to work toward goal.    Personal Goals Discharge:   Nutrition & Weight - Outcomes:    Nutrition:   Nutrition Discharge:   Education Questionnaire Score:   Goals reviewed with patient; copy given to patient.

## 2014-07-08 NOTE — Progress Notes (Unsigned)
Cardiac Individual Treatment Plan  Patient Details  Name: Bradley Mckenzie. MRN: [id number] Date of Birth: 19-Apr-1965 Referring Provider:  No ref. provider found  Initial Encounter Date:    Visit Diagnosis: No diagnosis found.  Patient's Home Medications on Admission:  Current outpatient prescriptions:  .  albuterol (PROVENTIL HFA;VENTOLIN HFA) 108 (90 BASE) MCG/ACT inhaler, Inhale 2 puffs into the lungs every 6 (six) hours as needed for wheezing or shortness of breath., Disp: , Rfl:  .  aspirin 81 MG tablet, Take 81 mg by mouth daily., Disp: , Rfl:  .  beclomethasone (QVAR) 40 MCG/ACT inhaler, Inhale 2 puffs into the lungs daily., Disp: , Rfl:  .  carvedilol (COREG) 6.25 MG tablet, Take 6.25 mg by mouth 2 (two) times daily with a meal., Disp: , Rfl:  .  furosemide (LASIX) 40 MG tablet, Take 40 mg by mouth daily., Disp: , Rfl:  .  isosorbide-hydrALAZINE (BIDIL) 20-37.5 MG per tablet, Take 1 tablet by mouth 3 (three) times daily., Disp: , Rfl:  .  potassium chloride (K-DUR,KLOR-CON) 10 MEQ tablet, Take 10 mEq by mouth 2 (two) times daily., Disp: , Rfl:  .  sacubitril-valsartan (ENTRESTO) 24-26 MG, Take 1 tablet by mouth 2 (two) times daily., Disp: 60 tablet, Rfl: 3 .  simvastatin (ZOCOR) 40 MG tablet, Take 40 mg by mouth daily., Disp: , Rfl:   Past Medical History: Past Medical History  Diagnosis Date  . Hypertension   . CHF (congestive heart failure)   . Diabetes mellitus without complication   . Obstructive sleep apnea   . Depression     Tobacco Use: History  Smoking status  . Former Smoker  Smokeless tobacco  . Never Used    Labs: Recent Review Flowsheet Data    There is no flowsheet data to display.       Exercise Target Goals:    Exercise Program Goal: Individual exercise prescription set with THRR, safety & activity barriers. Participant demonstrates ability to understand and report RPE using BORG scale, to self-measure pulse accurately, and to acknowledge  the importance of the exercise prescription.  Exercise Prescription Goal: Starting with aerobic activity 30 plus minutes a day, 3 days per week for initial exercise prescription. Provide home exercise prescription and guidelines that participant acknowledges understanding prior to discharge.  Activity Barriers & Risk Stratification:   6 Minute Walk:   Initial Exercise Prescription:   Exercise Prescription Changes:     Exercise Prescription Changes      06/06/14 1200 07/05/14 0700         Exercise Review   Progression No No  Patient has not started yet      Response to Exercise   Comments Patient has not attened an exercise session since intial orientation appt.          Discharge Exercise Prescription (Final Exercise Prescription Changes):     Exercise Prescription Changes - 07/05/14 0700    Exercise Review   Progression No  Patient has not started yet      Nutrition:  Target Goals: Understanding of nutrition guidelines, daily intake of sodium 1500mg , cholesterol 200mg , calories 30% from fat and 7% or less from saturated fats, daily to have 5 or more servings of fruits and vegetables.  Biometrics:    Nutrition Therapy Plan and Nutrition Goals:   Nutrition Discharge: Rate Your Plate Scores:   Nutrition Goals Re-Evaluation:   Psychosocial: Target Goals: Acknowledge presence or absence of depression, maximize coping skills, provide positive support system. Participant  is able to verbalize types and ability to use techniques and skills needed for reducing stress and depression.  Initial Review & Psychosocial Screening:   Quality of Life Scores:   PHQ-9:     Recent Review Flowsheet Data    Depression screen [facility] 2/9 06/16/2014 06/16/2014   Decreased Interest 0 0   Down, Depressed, Hopeless 0 0   PHQ - 2 Score 0 0      Psychosocial Evaluation and Intervention:   Psychosocial Re-Evaluation:   Vocational Rehabilitation: Provide vocational rehab  assistance to qualifying candidates.   Vocational Rehab Evaluation & Intervention:   Education: Education Goals: Education classes will be provided on a weekly basis, covering required topics. Participant will state understanding/return demonstration of topics presented.  Learning Barriers/Preferences:   Education Topics: General Nutrition Guidelines/Fats and Fiber: -Group instruction provided by verbal, written material, models and posters to present the general guidelines for heart healthy nutrition. Gives an explanation and review of dietary fats and fiber.   Controlling Sodium/Reading Food Labels: -Group verbal and written material supporting the discussion of sodium use in heart healthy nutrition. Review and explanation with models, verbal and written materials for utilization of the food label.   Exercise Physiology & Risk Factors: - Group verbal and written instruction with models to review the exercise physiology of the cardiovascular system and associated critical values. Details cardiovascular disease risk factors and the goals associated with each risk factor.   Aerobic Exercise & Resistance Training: - Gives group verbal and written discussion on the health impact of inactivity. On the components of aerobic and resistive training programs and the benefits of this training and how to safely progress through these programs.   Flexibility, Balance, General Exercise Guidelines: - Provides group verbal and written instruction on the benefits of flexibility and balance training programs. Provides general exercise guidelines with specific guidelines to those with heart or lung disease. Demonstration and skill practice provided.   Stress Management: - Provides group verbal and written instruction about the health risks of elevated stress, cause of high stress, and healthy ways to reduce stress.   Depression: - Provides group verbal and written instruction on the correlation  between heart/lung disease and depressed mood, treatment options, and the stigmas associated with seeking treatment.   Anatomy & Physiology of the Heart: - Group verbal and written instruction and models provide basic cardiac anatomy and physiology, with the coronary electrical and arterial systems. Review of: AMI, Angina, Valve disease, Heart Failure, Cardiac Arrhythmia, Pacemakers, and the ICD.   Cardiac Procedures: - Group verbal and written instruction and models to describe the testing methods done to diagnose heart disease. Reviews the outcomes of the test results. Describes the treatment choices: Medical Management, Angioplasty, or Coronary Bypass Surgery.   Cardiac Medications: - Group verbal and written instruction to review commonly prescribed medications for heart disease. Reviews the medication, class of the drug, and side effects. Includes the steps to properly store meds and maintain the prescription regimen.   Go Sex-Intimacy & Heart Disease, Get SMART - Goal Setting: - Group verbal and written instruction through game format to discuss heart disease and the return to sexual intimacy. Provides group verbal and written material to discuss and apply goal setting through the application of the S.M.A.R.T. Method.   Other Matters of the Heart: - Provides group verbal, written materials and models to describe Heart Failure, Angina, Valve Disease, and Diabetes in the realm of heart disease. Includes description of the disease process and treatment options  available to the cardiac patient.   Exercise & Equipment Safety: - Individual verbal instruction and demonstration of equipment use and safety with use of the equipment.   Infection Prevention: - Provides verbal and written material to individual with discussion of infection control including proper hand washing and proper equipment cleaning during exercise session.   Falls Prevention: - Provides verbal and written material  to individual with discussion of falls prevention and safety.   Diabetes: - Individual verbal and written instruction to review signs/symptoms of diabetes, desired ranges of glucose level fasting, after meals and with exercise. Advice that pre and post exercise glucose checks will be done for 3 sessions at entry of program.    Knowledge Questionnaire Score:   Personal Goals and Risk Factors at Admission:   Personal Goals and Risk Factors Review:      Goals and Risk Factor Review      07/08/14 1432           Increase Aerobic Exercise and Physical Activity   Goals Progress/Improvement seen  No       Comments Only attended orientation appt. Multiple vm left for Sharief that he never           Personal Goals Discharge:     Comments: Only did orientation appt.

## 2014-07-11 ENCOUNTER — Ambulatory Visit: Payer: Self-pay

## 2014-07-11 ENCOUNTER — Encounter: Payer: Self-pay | Admitting: *Deleted

## 2014-07-11 DIAGNOSIS — I5022 Chronic systolic (congestive) heart failure: Secondary | ICD-10-CM

## 2014-07-11 NOTE — Progress Notes (Signed)
Cardiac Individual Treatment Plan  Patient Details  Name: Bradley Mckenzie. MRN: [id number] Date of Birth: [birth year] Referring Provider:  No ref. provider found  Initial Encounter Date:    Visit Diagnosis: Chronic systolic heart failure  Patient's Home Medications on Admission:  Current outpatient prescriptions:  .  albuterol (PROVENTIL HFA;VENTOLIN HFA) 108 (90 BASE) MCG/ACT inhaler, Inhale 2 puffs into the lungs every 6 (six) hours as needed for wheezing or shortness of breath., Disp: , Rfl:  .  aspirin 81 MG tablet, Take 81 mg by mouth daily., Disp: , Rfl:  .  beclomethasone (QVAR) 40 MCG/ACT inhaler, Inhale 2 puffs into the lungs daily., Disp: , Rfl:  .  carvedilol (COREG) 6.25 MG tablet, Take 6.25 mg by mouth 2 (two) times daily with a meal., Disp: , Rfl:  .  furosemide (LASIX) 40 MG tablet, Take 40 mg by mouth daily., Disp: , Rfl:  .  isosorbide-hydrALAZINE (BIDIL) 20-37.5 MG per tablet, Take 1 tablet by mouth 3 (three) times daily., Disp: , Rfl:  .  potassium chloride (K-DUR,KLOR-CON) 10 MEQ tablet, Take 10 mEq by mouth 2 (two) times daily., Disp: , Rfl:  .  sacubitril-valsartan (ENTRESTO) 24-26 MG, Take 1 tablet by mouth 2 (two) times daily., Disp: 60 tablet, Rfl: 3 .  simvastatin (ZOCOR) 40 MG tablet, Take 40 mg by mouth daily., Disp: , Rfl:   Past Medical History: Past Medical History  Diagnosis Date  . Hypertension   . CHF (congestive heart failure)   . Diabetes mellitus without complication   . Obstructive sleep apnea   . Depression     Tobacco Use: History  Smoking status  . Former Smoker  Smokeless tobacco  . Never Used    Labs: Recent Review Flowsheet Data    There is no flowsheet data to display.       Exercise Target Goals:    Exercise Program Goal: Individual exercise prescription set with THRR, safety & activity barriers. Participant demonstrates ability to understand and report RPE using BORG scale, to self-measure pulse accurately, and to  acknowledge the importance of the exercise prescription.  Exercise Prescription Goal: Starting with aerobic activity 30 plus minutes a day, 3 days per week for initial exercise prescription. Provide home exercise prescription and guidelines that participant acknowledges understanding prior to discharge.  Activity Barriers & Risk Stratification:   6 Minute Walk:   Initial Exercise Prescription:   Exercise Prescription Changes:     Exercise Prescription Changes      06/06/14 1200 07/05/14 0700         Exercise Review   Progression No No  Patient has not started yet      Response to Exercise   Comments Patient has not attened an exercise session since intial orientation appt.          Discharge Exercise Prescription (Final Exercise Prescription Changes):     Exercise Prescription Changes - 07/05/14 0700    Exercise Review   Progression No  Patient has not started yet      Nutrition:  Target Goals: Understanding of nutrition guidelines, daily intake of sodium 1500mg , cholesterol 200mg , calories 30% from fat and 7% or less from saturated fats, daily to have 5 or more servings of fruits and vegetables.  Biometrics:    Nutrition Therapy Plan and Nutrition Goals:   Nutrition Discharge: Rate Your Plate Scores:   Nutrition Goals Re-Evaluation:   Psychosocial: Target Goals: Acknowledge presence or absence of depression, maximize coping skills, provide positive support system.  Participant is able to verbalize types and ability to use techniques and skills needed for reducing stress and depression.  Initial Review & Psychosocial Screening:   Quality of Life Scores:   PHQ-9:     Recent Review Flowsheet Data    Depression screen [facility] 2/9 06/16/2014 06/16/2014   Decreased Interest 0 0   Down, Depressed, Hopeless 0 0   PHQ - 2 Score 0 0      Psychosocial Evaluation and Intervention:   Psychosocial Re-Evaluation:   Vocational Rehabilitation: Provide  vocational rehab assistance to qualifying candidates.   Vocational Rehab Evaluation & Intervention:   Education: Education Goals: Education classes will be provided on a weekly basis, covering required topics. Participant will state understanding/return demonstration of topics presented.  Learning Barriers/Preferences:   Education Topics: General Nutrition Guidelines/Fats and Fiber: -Group instruction provided by verbal, written material, models and posters to present the general guidelines for heart healthy nutrition. Gives an explanation and review of dietary fats and fiber.   Controlling Sodium/Reading Food Labels: -Group verbal and written material supporting the discussion of sodium use in heart healthy nutrition. Review and explanation with models, verbal and written materials for utilization of the food label.   Exercise Physiology & Risk Factors: - Group verbal and written instruction with models to review the exercise physiology of the cardiovascular system and associated critical values. Details cardiovascular disease risk factors and the goals associated with each risk factor.   Aerobic Exercise & Resistance Training: - Gives group verbal and written discussion on the health impact of inactivity. On the components of aerobic and resistive training programs and the benefits of this training and how to safely progress through these programs.   Flexibility, Balance, General Exercise Guidelines: - Provides group verbal and written instruction on the benefits of flexibility and balance training programs. Provides general exercise guidelines with specific guidelines to those with heart or lung disease. Demonstration and skill practice provided.   Stress Management: - Provides group verbal and written instruction about the health risks of elevated stress, cause of high stress, and healthy ways to reduce stress.   Depression: - Provides group verbal and written instruction on  the correlation between heart/lung disease and depressed mood, treatment options, and the stigmas associated with seeking treatment.   Anatomy & Physiology of the Heart: - Group verbal and written instruction and models provide basic cardiac anatomy and physiology, with the coronary electrical and arterial systems. Review of: AMI, Angina, Valve disease, Heart Failure, Cardiac Arrhythmia, Pacemakers, and the ICD.   Cardiac Procedures: - Group verbal and written instruction and models to describe the testing methods done to diagnose heart disease. Reviews the outcomes of the test results. Describes the treatment choices: Medical Management, Angioplasty, or Coronary Bypass Surgery.   Cardiac Medications: - Group verbal and written instruction to review commonly prescribed medications for heart disease. Reviews the medication, class of the drug, and side effects. Includes the steps to properly store meds and maintain the prescription regimen.   Go Sex-Intimacy & Heart Disease, Get SMART - Goal Setting: - Group verbal and written instruction through game format to discuss heart disease and the return to sexual intimacy. Provides group verbal and written material to discuss and apply goal setting through the application of the S.M.A.R.T. Method.   Other Matters of the Heart: - Provides group verbal, written materials and models to describe Heart Failure, Angina, Valve Disease, and Diabetes in the realm of heart disease. Includes description of the disease process and treatment  options available to the cardiac patient.   Exercise & Equipment Safety: - Individual verbal instruction and demonstration of equipment use and safety with use of the equipment.   Infection Prevention: - Provides verbal and written material to individual with discussion of infection control including proper hand washing and proper equipment cleaning during exercise session.   Falls Prevention: - Provides verbal and  written material to individual with discussion of falls prevention and safety.   Diabetes: - Individual verbal and written instruction to review signs/symptoms of diabetes, desired ranges of glucose level fasting, after meals and with exercise. Advice that pre and post exercise glucose checks will be done for 3 sessions at entry of program.    Knowledge Questionnaire Score:   Personal Goals and Risk Factors at Admission:   Personal Goals and Risk Factors Review:      Goals and Risk Factor Review      07/08/14 1432           Increase Aerobic Exercise and Physical Activity   Goals Progress/Improvement seen  No       Comments Only attended orientation appt. Multiple vm left for Bradley Mckenzie that he never           Personal Goals Discharge:     Comments: Only attended Cardiac Rehab orientation appt. Multiple phone calls to him and left vm but never returned. Mailed letter to him asking him to call us but we did not hear back from him.

## 2014-07-13 ENCOUNTER — Ambulatory Visit: Payer: Self-pay

## 2014-07-14 ENCOUNTER — Ambulatory Visit: Payer: Self-pay

## 2014-07-15 ENCOUNTER — Ambulatory Visit: Payer: No Typology Code available for payment source | Attending: Specialist

## 2014-07-15 DIAGNOSIS — G4733 Obstructive sleep apnea (adult) (pediatric): Secondary | ICD-10-CM | POA: Diagnosis not present

## 2014-07-20 ENCOUNTER — Ambulatory Visit: Payer: Self-pay

## 2014-07-21 ENCOUNTER — Ambulatory Visit: Payer: Self-pay

## 2014-07-25 ENCOUNTER — Ambulatory Visit: Payer: Self-pay

## 2014-07-27 ENCOUNTER — Ambulatory Visit: Payer: Self-pay

## 2014-07-28 ENCOUNTER — Ambulatory Visit: Payer: Self-pay

## 2014-07-29 ENCOUNTER — Ambulatory Visit: Payer: No Typology Code available for payment source | Attending: Family | Admitting: Family

## 2014-07-29 ENCOUNTER — Encounter: Payer: Self-pay | Admitting: Family

## 2014-07-29 VITALS — BP 165/82 | HR 95 | Resp 20 | Ht 69.0 in | Wt 292.0 lb

## 2014-07-29 DIAGNOSIS — E119 Type 2 diabetes mellitus without complications: Secondary | ICD-10-CM | POA: Diagnosis not present

## 2014-07-29 DIAGNOSIS — Z87891 Personal history of nicotine dependence: Secondary | ICD-10-CM | POA: Insufficient documentation

## 2014-07-29 DIAGNOSIS — J40 Bronchitis, not specified as acute or chronic: Secondary | ICD-10-CM | POA: Diagnosis not present

## 2014-07-29 DIAGNOSIS — F329 Major depressive disorder, single episode, unspecified: Secondary | ICD-10-CM | POA: Insufficient documentation

## 2014-07-29 DIAGNOSIS — Z79899 Other long term (current) drug therapy: Secondary | ICD-10-CM | POA: Diagnosis not present

## 2014-07-29 DIAGNOSIS — I5020 Unspecified systolic (congestive) heart failure: Secondary | ICD-10-CM | POA: Diagnosis present

## 2014-07-29 DIAGNOSIS — I10 Essential (primary) hypertension: Secondary | ICD-10-CM | POA: Diagnosis not present

## 2014-07-29 DIAGNOSIS — G4733 Obstructive sleep apnea (adult) (pediatric): Secondary | ICD-10-CM | POA: Diagnosis not present

## 2014-07-29 DIAGNOSIS — I5022 Chronic systolic (congestive) heart failure: Secondary | ICD-10-CM

## 2014-07-29 DIAGNOSIS — Z7982 Long term (current) use of aspirin: Secondary | ICD-10-CM | POA: Diagnosis not present

## 2014-07-29 MED ORDER — SACUBITRIL-VALSARTAN 24-26 MG PO TABS: 1.0000 | ORAL_TABLET | Freq: Two times a day (BID) | ORAL | Status: DC

## 2014-07-29 MED ORDER — TORSEMIDE 20 MG PO TABS: 40.0000 mg | ORAL_TABLET | Freq: Every day | ORAL | Status: DC

## 2014-07-29 MED ORDER — ISOSORB DINITRATE-HYDRALAZINE 20-37.5 MG PO TABS: 1.0000 | ORAL_TABLET | Freq: Three times a day (TID) | ORAL | Status: DC

## 2014-07-29 MED ORDER — SIMVASTATIN 40 MG PO TABS: 40.0000 mg | ORAL_TABLET | Freq: Every day | ORAL | Status: DC

## 2014-07-29 MED ORDER — POTASSIUM CHLORIDE CRYS ER 10 MEQ PO TBCR: 10.0000 meq | EXTENDED_RELEASE_TABLET | Freq: Two times a day (BID) | ORAL | Status: DC

## 2014-07-29 MED ORDER — CARVEDILOL 6.25 MG PO TABS: 6.2500 mg | ORAL_TABLET | Freq: Two times a day (BID) | ORAL | Status: DC

## 2014-07-29 MED ORDER — AZITHROMYCIN 250 MG PO TABS
ORAL_TABLET | ORAL | Status: DC
Start: 2014-07-29 — End: 2014-08-05

## 2014-07-29 NOTE — Patient Instructions (Addendum)
Resume weighing daily and call for an overnight weight gain of >2 pounds or a weekly weight gain of >5 pounds.   Will check lab work next week.

## 2014-07-29 NOTE — Progress Notes (Signed)
Subjective:    Patient ID: Bradley Mckenzie., male    DOB: 16-May-1965, 49 y.o.   MRN: [id number]  Congestive Heart Failure Presents for follow-up visit. The disease course has been worsening. Associated symptoms include chest pressure (when short of breath), edema, fatigue, palpitations, shortness of breath and unexpected weight change (gained 6 pounds since being here last). Pertinent negatives include no abdominal pain or chest pain. Past treatments include angiotensin receptor blockers, beta blockers and salt and fluid restriction. The treatment provided mild relief. Compliance with prior treatments has been variable. His past medical history is significant for DM and HTN.  Cough This is a recurrent problem. The current episode started more than 1 month ago. The problem has been gradually worsening. The problem occurs every few hours. The cough is non-productive. Associated symptoms include shortness of breath. Pertinent negatives include no chest pain, chills, fever, headaches, heartburn, postnasal drip, sore throat or weight loss. Nothing aggravates the symptoms. He has tried a beta-agonist inhaler and steroid inhaler for the symptoms. The treatment provided mild relief.    Past Medical History  Diagnosis Date  . Hypertension   . CHF (congestive heart failure)   . Diabetes mellitus without complication   . Obstructive sleep apnea   . Depression     Past Surgical History  Procedure Laterality Date  . Hernia repair  2004    History  Substance Use Topics  . Smoking status: Former Research scientist (life sciences)  . Smokeless tobacco: Never Used  . Alcohol Use: No     Comment: occassional alcohol use at special events    No Known Allergies   Prior to Admission medications   Medication Sig Start Date End Date Taking? Authorizing Provider  albuterol (PROVENTIL HFA;VENTOLIN HFA) 108 (90 BASE) MCG/ACT inhaler Inhale 2 puffs into the lungs every 6 (six) hours as needed for wheezing or shortness of breath.    Yes Historical Provider, MD  aspirin 81 MG tablet Take 81 mg by mouth daily.   Yes Historical Provider, MD  beclomethasone (QVAR) 40 MCG/ACT inhaler Inhale 2 puffs into the lungs daily.   Yes Historical Provider, MD  carvedilol (COREG) 6.25 MG tablet Take 1 tablet (6.25 mg total) by mouth 2 (two) times daily with a meal. 07/29/14  Yes Alisa Graff, FNP  isosorbide-hydrALAZINE (BIDIL) 20-37.5 MG per tablet Take 1 tablet by mouth 3 (three) times daily. 07/29/14  Yes Alisa Graff, FNP  potassium chloride (K-DUR,KLOR-CON) 10 MEQ tablet Take 1 tablet (10 mEq total) by mouth 2 (two) times daily. 07/29/14  Yes Alisa Graff, FNP  sacubitril-valsartan (ENTRESTO) 24-26 MG Take 1 tablet by mouth 2 (two) times daily. 07/29/14  Yes Alisa Graff, FNP  simvastatin (ZOCOR) 40 MG tablet Take 1 tablet (40 mg total) by mouth daily. 07/29/14  Yes Alisa Graff, FNP  azithromycin (ZITHROMAX) 250 MG tablet Take 2 tablets on day 1 and then 1 tablet daily for days 2-5 07/29/14   Alisa Graff, FNP  torsemide (DEMADEX) 20 MG tablet Take 2 tablets (40 mg total) by mouth daily. 07/29/14   Alisa Graff, FNP    Review of Systems  Constitutional: Positive for fatigue and unexpected weight change (gained 6 pounds since being here last). Negative for fever, chills and weight loss.  HENT: Positive for congestion. Negative for postnasal drip and sore throat.   Eyes: Negative.   Respiratory: Positive for cough (worse over the last 1-2 weeks), chest tightness (when short of breath) and shortness of  breath.   Cardiovascular: Positive for palpitations and leg swelling. Negative for chest pain.  Gastrointestinal: Negative for heartburn, abdominal pain and abdominal distention.  Endocrine: Negative.   Genitourinary: Negative.   Musculoskeletal: Positive for back pain. Negative for neck pain.  Skin: Negative.   Allergic/Immunologic: Negative.   Neurological: Positive for dizziness. Negative for light-headedness and headaches.   Hematological: Negative for adenopathy. Does not bruise/bleed easily.  Psychiatric/Behavioral: Positive for sleep disturbance (sleeping in recliner. ). The patient is not nervous/anxious.        Objective:   Physical Exam  Constitutional: He is oriented to person, place, and time. He appears well-developed and well-nourished.  HENT:  Head: Normocephalic and atraumatic.  Eyes: Conjunctivae are normal. Pupils are equal, round, and reactive to light.  Neck: Normal range of motion. Neck supple.  Cardiovascular: Regular rhythm.  Tachycardia present.   Pulmonary/Chest: No respiratory distress. He has wheezes in the right upper field, the right lower field and the left lower field. He has rhonchi in the right upper field and the left upper field. He has rales in the right lower field and the left lower field.  Abdominal: Soft. He exhibits distension. There is no tenderness.  Musculoskeletal: He exhibits edema (1+ pitting edema in bilateral lower legs). He exhibits no tenderness.  Neurological: He is alert and oriented to person, place, and time.  Skin: Skin is warm and dry. No erythema.  Psychiatric: He has a normal mood and affect. His behavior is normal.  Nursing note and vitals reviewed.   BP 165/82 mmHg  Pulse 95  Resp 20  Ht 5\' 9"  (1.753 m)  Wt 292 lb (132.45 kg)  BMI 43.10 kg/m2  SpO2 97%       Assessment & Plan:  1: Chronic heart failure with reduced ejection fraction- Patient presents with weight gain of unknown duration, fatigue, shortness of breath and continued edema in both lower legs and abdomen. He hasn't been weighing daily because of his work schedule. Discussed the importance of weighing daily so that we know if this 6 pound weight gain happened over the last week or over the last month. He commits to resuming weighing daily. He is not adding any salt to his food and is trying to closely follow a low sodium diet but continues to have edema in his abdomen and legs. Will  change his diuretic to torsemide 40mg  daily and stop his furosemide. May decrease his torsemide as time goes on but hopefully changing the diuretic will help. Was going to check lab work today due to starting entresto last time but will wait until next week since adjusting his diuretic. He hasn't been able to attend cardiac rehab classes due to his extended work hours.  2: HTN- Blood pressure elevated which could be due to his weight gain and extra fluid. Changing diuretic per above. 3: Sleep apnea- Patient has had his sleep study done and has to return for the titration part.  4: Bronchitis- Patient has rhonchi in lungs along with wheezing. Also says that he has a nonproductive cough. Will treat with a zpack and re-evaluate next week.   Return in 1 week for labs and for re-check.

## 2014-08-01 ENCOUNTER — Ambulatory Visit: Payer: Self-pay

## 2014-08-02 ENCOUNTER — Other Ambulatory Visit: Payer: Self-pay | Admitting: Family

## 2014-08-02 MED ORDER — TORSEMIDE 20 MG PO TABS: 40.0000 mg | ORAL_TABLET | Freq: Every day | ORAL | Status: DC

## 2014-08-03 ENCOUNTER — Encounter: Payer: Self-pay | Admitting: Emergency Medicine

## 2014-08-03 ENCOUNTER — Ambulatory Visit: Payer: Self-pay

## 2014-08-03 ENCOUNTER — Emergency Department
Admission: EM | Admit: 2014-08-03 | Discharge: 2014-08-03 | Disposition: A | Discharge status: Home / Self Care | Payer: No Typology Code available for payment source | Attending: Student | Admitting: Student

## 2014-08-03 ENCOUNTER — Emergency Department: Payer: No Typology Code available for payment source

## 2014-08-03 DIAGNOSIS — Z87891 Personal history of nicotine dependence: Secondary | ICD-10-CM | POA: Insufficient documentation

## 2014-08-03 DIAGNOSIS — J069 Acute upper respiratory infection, unspecified: Secondary | ICD-10-CM | POA: Insufficient documentation

## 2014-08-03 DIAGNOSIS — Z79899 Other long term (current) drug therapy: Secondary | ICD-10-CM | POA: Insufficient documentation

## 2014-08-03 DIAGNOSIS — Z7982 Long term (current) use of aspirin: Secondary | ICD-10-CM | POA: Insufficient documentation

## 2014-08-03 DIAGNOSIS — J45901 Unspecified asthma with (acute) exacerbation: Secondary | ICD-10-CM | POA: Diagnosis not present

## 2014-08-03 DIAGNOSIS — R05 Cough: Secondary | ICD-10-CM | POA: Diagnosis present

## 2014-08-03 DIAGNOSIS — I10 Essential (primary) hypertension: Secondary | ICD-10-CM | POA: Diagnosis not present

## 2014-08-03 DIAGNOSIS — E119 Type 2 diabetes mellitus without complications: Secondary | ICD-10-CM | POA: Insufficient documentation

## 2014-08-03 DIAGNOSIS — Z7951 Long term (current) use of inhaled steroids: Secondary | ICD-10-CM | POA: Diagnosis not present

## 2014-08-03 DIAGNOSIS — Z791 Long term (current) use of non-steroidal anti-inflammatories (NSAID): Secondary | ICD-10-CM | POA: Insufficient documentation

## 2014-08-03 MED ORDER — PROMETHAZINE-DM 6.25-15 MG/5ML PO SYRP: 5.0000 mL | ORAL_SOLUTION | Freq: Four times a day (QID) | ORAL | Status: DC | PRN

## 2014-08-03 MED ORDER — IPRATROPIUM-ALBUTEROL 0.5-2.5 (3) MG/3ML IN SOLN
3.0000 mL | Freq: Once | RESPIRATORY_TRACT | Status: AC
  Administered 2014-08-03: 3 mL via RESPIRATORY_TRACT
  Filled 2014-08-03: qty 3

## 2014-08-03 NOTE — ED Provider Notes (Signed)
[facility] Emergency Department Provider Note  ____________________________________________  Time seen: Approximately 1:06 PM  I have reviewed the triage vital signs and the nursing notes.   HISTORY  Chief Complaint Cough   HPI Bradley Mckenzie. is a 49 y.o. male presenting to the emergency room with complaints of congested cough x 2 weeks. He notes having aches and chills but is unsure of any fever.  He was seen at the heart failure clinic last Friday at which time they started him on a Z-pack.  No CXR was done per patient. He has notes that his lower extremities and abdomen are swollen at baseline.  He has used his daily inhaler for asthma as prescribed.   Past Medical History  Diagnosis Date  . Hypertension   . CHF (congestive heart failure)   . Diabetes mellitus without complication   . Obstructive sleep apnea   . Depression     Patient Active Problem List   Diagnosis Date Noted  . Chronic systolic heart failure 123456  . Obstructive sleep apnea 06/16/2014  . Essential hypertension 06/16/2014  . H/O respiratory system disease 02/24/2014    Past Surgical History  Procedure Laterality Date  . Hernia repair  2004    Current Outpatient Rx  Name  Route  Sig  Dispense  Refill  . albuterol (PROVENTIL HFA;VENTOLIN HFA) 108 (90 BASE) MCG/ACT inhaler   Inhalation   Inhale 2 puffs into the lungs every 6 (six) hours as needed for wheezing or shortness of breath.         [name] aspirin 81 MG tablet   Oral   Take 81 mg by mouth daily.         [name] azithromycin (ZITHROMAX) 250 MG tablet      Take 2 tablets on day 1 and then 1 tablet daily for days 2-5   6 each   0   . beclomethasone (QVAR) 40 MCG/ACT inhaler   Inhalation   Inhale 2 puffs into the lungs daily.         . carvedilol (COREG) 6.25 MG tablet   Oral   Take 1 tablet (6.25 mg total) by mouth 2 (two) times daily with a meal.   60 tablet   5   . isosorbide-hydrALAZINE (BIDIL)  20-37.5 MG per tablet   Oral   Take 1 tablet by mouth 3 (three) times daily.   90 tablet   3   . potassium chloride (K-DUR,KLOR-CON) 10 MEQ tablet   Oral   Take 1 tablet (10 mEq total) by mouth 2 (two) times daily.   60 tablet   3   . promethazine-dextromethorphan (PROMETHAZINE-DM) 6.25-15 MG/5ML syrup   Oral   Take 5 mLs by mouth 4 (four) times daily as needed for cough.   118 mL   0   . sacubitril-valsartan (ENTRESTO) 24-26 MG   Oral   Take 1 tablet by mouth 2 (two) times daily.   60 tablet   3   . simvastatin (ZOCOR) 40 MG tablet   Oral   Take 1 tablet (40 mg total) by mouth daily.   30 tablet   3   . torsemide (DEMADEX) 20 MG tablet   Oral   Take 2 tablets (40 mg total) by mouth daily.   60 tablet   3     Allergies Review of patient's allergies indicates no known allergies.  Family History  Problem Relation Age of Onset  . Heart disease Mother   . Heart  attack Father 52  . Diabetes Father   . Cancer Father   . Cancer Sister     Social History History  Substance Use Topics  . Smoking status: Former Research scientist (life sciences)  . Smokeless tobacco: Never Used  . Alcohol Use: No     Comment: occassional alcohol use at special events    Review of Systems Constitutional: Positive for chills, unsure of fever. Eyes: No visual changes. ENT: Mild sore throat from productive cough. Cardiovascular: Denies chest pain. Respiratory: Positive for shortness of breath and cough.  Gastrointestinal: No abdominal pain.  No nausea, no vomiting.  No diarrhea.  No constipation. Genitourinary: Negative for dysuria. Musculoskeletal: Negative for back pain. Skin: Negative for rash. Neurological: Positive for headaches  10-point ROS otherwise negative.  ____________________________________________   PHYSICAL EXAM:  VITAL SIGNS: ED Triage Vitals  Enc Vitals Group     BP --      Pulse --      Resp --      Temp --      Temp src --      SpO2 --      Weight --      Height --       Head Cir --      Peak Flow --      Pain Score --      Pain Loc --      Pain Edu? --      Excl. in [place]? --     Constitutional: Alert and oriented. Appears uncomfortable at rest and cough noted. Eyes: Conjunctivae are normal. PERRL. EOMI. Head: Atraumatic. Nose: No congestion/rhinnorhea. Mouth/Throat: Mucous membranes are moist.  Mild erythema. Neck: No stridor.   Hematological/Lymphatic/Immunilogical: No cervical lymphadenopathy. Cardiovascular: Normal rate, regular rhythm. Grossly normal heart sounds.  Good peripheral circulation. Elevated BP Respiratory: Increased respiratory effort.  Wheezing noted throughout. Gastrointestinal: Abdomen distended. No pain with palpation. Normoactive bowel sounds. Musculoskeletal: Bilateral edema of the lower extremities. Neurologic:  Normal speech and language. No gross focal neurologic deficits are appreciated. No gait instability. Skin:  Skin is warm, dry and intact. No rash noted. Psychiatric: Mood and affect are normal. Speech and behavior are normal.  ____________________________________________   LABS (all labs ordered are listed, but only abnormal results are displayed)  Labs Reviewed - No data to display ____________________________________________  EKG   ____________________________________________  RADIOLOGY  No acute cardiopulmonary findings.  I, Sable Feil, personally viewed and evaluated these images as part of my medical decision making.   ____________________________________________   PROCEDURES  Procedure(s) performed: None  Critical Care performed: No  ____________________________________________   INITIAL IMPRESSION / ASSESSMENT AND PLAN / ED COURSE  Pertinent labs & imaging results that were available during my care of the patient were reviewed by me and considered in my medical decision making (see chart for details).  Upper respiratory infection with bronchospasms. Patient given DuoNeb treatment  which he states improved his breathing effort. Patient advised to continue taking Zithromax as directed and was given a prescription for Bromfed DM. __________________________________________   FINAL CLINICAL IMPRESSION(S) / ED DIAGNOSES  Final diagnoses:  URI, acute      Sable Feil, PA-C 08/03/14 [id number]  Joanne Gavel, MD 08/03/14 [phone]

## 2014-08-03 NOTE — Discharge Instructions (Signed)
Cough, Adult   A cough is a reflex. It helps you clear your throat and airways. A cough can help heal your body. A cough can last 2 or 3 weeks (acute) or may last more than 8 weeks (chronic). Some common causes of a cough can include an infection, allergy, or a cold.  HOME CARE  · Only take medicine as told by your doctor.  · If given, take your medicines (antibiotics) as told. Finish them even if you start to feel better.  · Use a cold steam vaporizer or humidifier in your home. This can help loosen thick spit (secretions).  · Sleep so you are almost sitting up (semi-upright). Use pillows to do this. This helps reduce coughing.  · Rest as needed.  · Stop smoking if you smoke.  GET HELP RIGHT AWAY IF:  · You have yellowish-white fluid (pus) in your thick spit.  · Your cough gets worse.  · Your medicine does not reduce coughing, and you are losing sleep.  · You cough up blood.  · You have trouble breathing.  · Your pain gets worse and medicine does not help.  · You have a fever.  MAKE SURE YOU:   · Understand these instructions.  · Will watch your condition.  · Will get help right away if you are not doing well or get worse.  Document Released: 09/13/2010 Document Revised: 05/17/2013 Document Reviewed: 09/13/2010  ExitCare® Patient Information ©2015 [employer]. This information is not intended to replace advice given to you by your health care provider. Make sure you discuss any questions you have with your health care provider.

## 2014-08-03 NOTE — ED Notes (Signed)
Presents with cough body aches  chills

## 2014-08-04 ENCOUNTER — Ambulatory Visit: Payer: Self-pay

## 2014-08-05 ENCOUNTER — Other Ambulatory Visit: Payer: Self-pay | Admitting: Family

## 2014-08-05 ENCOUNTER — Ambulatory Visit: Payer: No Typology Code available for payment source | Admitting: Family

## 2014-08-05 ENCOUNTER — Encounter: Payer: Self-pay | Admitting: Family

## 2014-08-05 ENCOUNTER — Ambulatory Visit: Payer: No Typology Code available for payment source | Attending: Family | Admitting: Family

## 2014-08-05 ENCOUNTER — Ambulatory Visit
Admission: RE | Admit: 2014-08-05 | Discharge: 2014-08-05 | Disposition: A | Discharge status: Home / Self Care | Payer: No Typology Code available for payment source | Source: Ambulatory Visit | Attending: Family | Admitting: Family

## 2014-08-05 ENCOUNTER — Telehealth: Payer: Self-pay | Admitting: Family

## 2014-08-05 VITALS — BP 140/73 | HR 95 | Resp 18 | Ht 69.0 in | Wt 290.0 lb

## 2014-08-05 DIAGNOSIS — G4733 Obstructive sleep apnea (adult) (pediatric): Secondary | ICD-10-CM | POA: Diagnosis not present

## 2014-08-05 DIAGNOSIS — Z7982 Long term (current) use of aspirin: Secondary | ICD-10-CM | POA: Diagnosis not present

## 2014-08-05 DIAGNOSIS — J441 Chronic obstructive pulmonary disease with (acute) exacerbation: Secondary | ICD-10-CM | POA: Diagnosis not present

## 2014-08-05 DIAGNOSIS — I10 Essential (primary) hypertension: Secondary | ICD-10-CM | POA: Insufficient documentation

## 2014-08-05 DIAGNOSIS — E119 Type 2 diabetes mellitus without complications: Secondary | ICD-10-CM | POA: Diagnosis not present

## 2014-08-05 DIAGNOSIS — J410 Simple chronic bronchitis: Secondary | ICD-10-CM

## 2014-08-05 DIAGNOSIS — I5022 Chronic systolic (congestive) heart failure: Secondary | ICD-10-CM

## 2014-08-05 DIAGNOSIS — Z87891 Personal history of nicotine dependence: Secondary | ICD-10-CM | POA: Diagnosis not present

## 2014-08-05 DIAGNOSIS — Z79899 Other long term (current) drug therapy: Secondary | ICD-10-CM | POA: Diagnosis not present

## 2014-08-05 DIAGNOSIS — F329 Major depressive disorder, single episode, unspecified: Secondary | ICD-10-CM | POA: Insufficient documentation

## 2014-08-05 DIAGNOSIS — I5020 Unspecified systolic (congestive) heart failure: Secondary | ICD-10-CM | POA: Diagnosis present

## 2014-08-05 DIAGNOSIS — J449 Chronic obstructive pulmonary disease, unspecified: Secondary | ICD-10-CM | POA: Insufficient documentation

## 2014-08-05 LAB — BASIC METABOLIC PANEL
Anion gap: 9 (ref 5–15)
BUN: 12 mg/dL (ref 6–20)
CO2: 28 mmol/L (ref 22–32)
Calcium: 9.1 mg/dL (ref 8.9–10.3)
Chloride: 101 mmol/L (ref 101–111)
Creatinine, Ser: 1.08 mg/dL (ref 0.61–1.24)
GFR calc Af Amer: >60 mL/min (ref >60)
GFR calc non Af Amer: >60 mL/min (ref >60)
GLUCOSE: 132 mg/dL — AB (ref 65–99)
Potassium: 3.7 mmol/L (ref 3.5–5.1)
SODIUM: 138 mmol/L (ref 135–145)

## 2014-08-05 MED ORDER — PREDNISONE 10 MG PO TABS: ORAL_TABLET | ORAL | Status: DC

## 2014-08-05 MED ORDER — METOLAZONE 5 MG PO TABS: 5.0000 mg | ORAL_TABLET | ORAL | Status: DC

## 2014-08-05 NOTE — Patient Instructions (Signed)
Take prednisone with food.   Take 1 dose of metolazone today (sample provided)  Monitor sodium intake carefully and continue weighing daily.   Begin using humidifier.

## 2014-08-05 NOTE — Progress Notes (Signed)
Subjective:    Patient ID: Bradley Pitts., male    DOB: 22-Dec-1965, 49 y.o.   MRN: [id number]  Congestive Heart Failure Presents for follow-up visit. The disease course has been stable. Associated symptoms include edema, fatigue, orthopnea (still sleeping in recliner), palpitations (when coughing) and shortness of breath. Pertinent negatives include no abdominal pain or chest pain. The symptoms have been stable. Past treatments include beta blockers, salt and fluid restriction and angiotensin receptor blockers. The treatment provided moderate relief. Compliance with prior treatments has been good. His past medical history is significant for chronic lung disease and HTN.  Wheezing  This is a recurrent problem. The current episode started 1 to 4 weeks ago. The problem occurs daily. The problem has been gradually worsening. Associated symptoms include coughing, diarrhea, neck pain (left side of neck), shortness of breath, sputum production and vomiting (when coughing so hard). Pertinent negatives include no abdominal pain, chest pain, chills, fever, rhinorrhea or sore throat. Nothing aggravates the symptoms. He has tried beta agonist inhalers, OTC cough suppressant and steroid inhaler for the symptoms. The treatment provided mild relief. His past medical history is significant for chronic lung disease and heart failure.  Other This is a chronic problem. The problem occurs daily. The problem has been unchanged. Associated symptoms include congestion, coughing, fatigue, neck pain (left side of neck) and vomiting (when coughing so hard). Pertinent negatives include no abdominal pain, chest pain, chills, fever or sore throat. Nothing aggravates the symptoms. He has tried position changes for the symptoms. The treatment provided mild relief.   Past Medical History  Diagnosis Date  . Hypertension   . CHF (congestive heart failure)   . Diabetes mellitus without complication   . Obstructive sleep apnea    . Depression     Past Surgical History  Procedure Laterality Date  . Hernia repair  2004    Family History  Problem Relation Age of Onset  . Heart disease Mother   . Heart attack Father 46  . Diabetes Father   . Cancer Father   . Cancer Sister     History  Substance Use Topics  . Smoking status: Former Research scientist (life sciences)  . Smokeless tobacco: Never Used  . Alcohol Use: No     Comment: occassional alcohol use at special events    No Known Allergies   Prior to Admission medications   Medication Sig Start Date End Date Taking? Authorizing Provider  albuterol (PROVENTIL HFA;VENTOLIN HFA) 108 (90 BASE) MCG/ACT inhaler Inhale 2 puffs into the lungs every 6 (six) hours as needed for wheezing or shortness of breath.   Yes Historical Provider, MD  aspirin 81 MG tablet Take 81 mg by mouth daily.   Yes Historical Provider, MD  beclomethasone (QVAR) 40 MCG/ACT inhaler Inhale 2 puffs into the lungs daily.   Yes Historical Provider, MD  carvedilol (COREG) 6.25 MG tablet Take 1 tablet (6.25 mg total) by mouth 2 (two) times daily with a meal. 07/29/14  Yes Alisa Graff, FNP  isosorbide-hydrALAZINE (BIDIL) 20-37.5 MG per tablet Take 1 tablet by mouth 3 (three) times daily. 07/29/14  Yes Alisa Graff, FNP  potassium chloride (K-DUR,KLOR-CON) 10 MEQ tablet Take 1 tablet (10 mEq total) by mouth 2 (two) times daily. 07/29/14  Yes Alisa Graff, FNP  promethazine-dextromethorphan (PROMETHAZINE-DM) 6.25-15 MG/5ML syrup Take 5 mLs by mouth 4 (four) times daily as needed for cough. 08/03/14  Yes Sable Feil, PA-C  sacubitril-valsartan (ENTRESTO) 24-26 MG Take 1 tablet by  mouth 2 (two) times daily. 07/29/14  Yes Alisa Graff, FNP  simvastatin (ZOCOR) 40 MG tablet Take 1 tablet (40 mg total) by mouth daily. 07/29/14  Yes Alisa Graff, FNP  torsemide (DEMADEX) 20 MG tablet Take 2 tablets (40 mg total) by mouth daily. 08/02/14  Yes Alisa Graff, FNP  predniSONE (DELTASONE) 10 MG tablet Take 6 tablets on day  1, 5 tablets on day 2, 4 tablets on day 3, 3 tablets on day 4, 2 tablets on day 5, 1 tablet on day 6 and then stop.   Take with food 08/05/14   Alisa Graff, FNP     Review of Systems  Constitutional: Positive for fatigue. Negative for fever, chills and appetite change.  HENT: Positive for congestion and sinus pressure. Negative for rhinorrhea and sore throat.   Eyes: Positive for pain (left eye "sore"). Negative for redness.  Respiratory: Positive for cough, sputum production, shortness of breath and wheezing. Negative for chest tightness.   Cardiovascular: Positive for palpitations (when coughing) and leg swelling. Negative for chest pain.  Gastrointestinal: Positive for vomiting (when coughing so hard) and diarrhea. Negative for abdominal pain and abdominal distention.  Endocrine: Negative.   Genitourinary: Negative.   Musculoskeletal: Positive for neck pain (left side of neck). Negative for back pain.  Skin: Negative.   Allergic/Immunologic: Negative.   Neurological: Positive for light-headedness (when coughing hard). Negative for dizziness.  Hematological: Negative for adenopathy. Does not bruise/bleed easily.  Psychiatric/Behavioral: Positive for sleep disturbance (sleeping in recliner). The patient is not nervous/anxious.        Objective:   Physical Exam  Constitutional: He is oriented to person, place, and time. He appears well-developed and well-nourished.  HENT:  Head: Normocephalic and atraumatic.  Eyes: Conjunctivae are normal. Pupils are equal, round, and reactive to light.  Neck: Normal range of motion. Neck supple.  Cardiovascular: Regular rhythm.  Tachycardia present.   No murmur heard. Pulmonary/Chest: Effort normal. He has wheezes in the left lower field. He has no rales.  Abdominal: Soft. He exhibits no distension. There is no tenderness.  Musculoskeletal: He exhibits edema (2+ pitting edema in bilateral lower legs). He exhibits no tenderness.  Neurological:  He is alert and oriented to person, place, and time.  Skin: Skin is warm and dry.  Psychiatric: He has a normal mood and affect. His behavior is normal. Thought content normal.  Nursing note and vitals reviewed.   BP 140/73 mmHg  Pulse 95  Resp 18  Ht 5\' 9"  (1.753 m)  Wt 290 lb (131.543 kg)  BMI 42.81 kg/m2  SpO2 96%       Assessment & Plan:  1: Chronic heart failure with reduced ejection fraction- Patient presents with continued edema, fatigue and shortness of breath. Switched to torsemide at last visit but he really doesn't think it's helped very much. Weight is down 2 pounds from his last visit. Reminded to call for an overnight weight gain of >2 pounds or a weekly weight gain of >5 pounds. 1 sample provided of metolazone 5mg  to take in the morning 1/2 hour prior to his morning torsemide dose. Encouraged him to elevate his legs when he's at home. He is not adding any salt to his food. Checking a basic metabolic panel today.  2: HTN- Blood pressure better today. Continue medications at this time.  3: COPD exacerbation- Patient was treated with a zpack last week and then was in the ER 2 days ago. He was treated with  a breathing treatment with slight improvement of symptoms. CXR was negative. Wheezing heard today so will treat with prednisone taper 10mg  tablets. He is to start at 60mg  and decrease daily by 10mg . Instructed to take with food.   Work note given for today.   Return in one week for reevaluation.

## 2014-08-05 NOTE — Addendum Note (Signed)
Addended by: Darylene Price A on: 08/05/2014 01:33 PM   Modules accepted: Miquel Dunn

## 2014-08-05 NOTE — Telephone Encounter (Signed)
Informed patient that his potassium and kidney function were all normal. Continue medications.

## 2014-08-07 ENCOUNTER — Encounter: Payer: Self-pay | Admitting: Emergency Medicine

## 2014-08-07 ENCOUNTER — Inpatient Hospital Stay
Admission: EM | Admit: 2014-08-07 | Discharge: 2014-08-08 | DRG: 683 | Disposition: A | Discharge status: Home / Self Care | Payer: No Typology Code available for payment source | Attending: Internal Medicine | Admitting: Internal Medicine

## 2014-08-07 DIAGNOSIS — E119 Type 2 diabetes mellitus without complications: Secondary | ICD-10-CM | POA: Diagnosis present

## 2014-08-07 DIAGNOSIS — I5022 Chronic systolic (congestive) heart failure: Secondary | ICD-10-CM | POA: Diagnosis present

## 2014-08-07 DIAGNOSIS — G4733 Obstructive sleep apnea (adult) (pediatric): Secondary | ICD-10-CM | POA: Diagnosis present

## 2014-08-07 DIAGNOSIS — F329 Major depressive disorder, single episode, unspecified: Secondary | ICD-10-CM | POA: Diagnosis present

## 2014-08-07 DIAGNOSIS — I10 Essential (primary) hypertension: Secondary | ICD-10-CM | POA: Diagnosis present

## 2014-08-07 DIAGNOSIS — T502X5A Adverse effect of carbonic-anhydrase inhibitors, benzothiadiazides and other diuretics, initial encounter: Secondary | ICD-10-CM | POA: Diagnosis present

## 2014-08-07 DIAGNOSIS — Z87891 Personal history of nicotine dependence: Secondary | ICD-10-CM | POA: Diagnosis not present

## 2014-08-07 DIAGNOSIS — Z8249 Family history of ischemic heart disease and other diseases of the circulatory system: Secondary | ICD-10-CM

## 2014-08-07 DIAGNOSIS — N179 Acute kidney failure, unspecified: Principal | ICD-10-CM | POA: Diagnosis present

## 2014-08-07 DIAGNOSIS — Z809 Family history of malignant neoplasm, unspecified: Secondary | ICD-10-CM

## 2014-08-07 DIAGNOSIS — T380X5A Adverse effect of glucocorticoids and synthetic analogues, initial encounter: Secondary | ICD-10-CM | POA: Diagnosis present

## 2014-08-07 DIAGNOSIS — R531 Weakness: Secondary | ICD-10-CM | POA: Diagnosis present

## 2014-08-07 DIAGNOSIS — E876 Hypokalemia: Secondary | ICD-10-CM | POA: Diagnosis present

## 2014-08-07 DIAGNOSIS — G4730 Sleep apnea, unspecified: Secondary | ICD-10-CM | POA: Diagnosis present

## 2014-08-07 DIAGNOSIS — F1290 Cannabis use, unspecified, uncomplicated: Secondary | ICD-10-CM | POA: Diagnosis present

## 2014-08-07 DIAGNOSIS — Z833 Family history of diabetes mellitus: Secondary | ICD-10-CM

## 2014-08-07 DIAGNOSIS — J441 Chronic obstructive pulmonary disease with (acute) exacerbation: Secondary | ICD-10-CM | POA: Diagnosis present

## 2014-08-07 LAB — BASIC METABOLIC PANEL
Anion gap: 13 (ref 5–15)
BUN: 21 mg/dL — ABNORMAL HIGH (ref 6–20)
CHLORIDE: 95 mmol/L — AB (ref 101–111)
CO2: 31 mmol/L (ref 22–32)
CREATININE: 2.41 mg/dL — AB (ref 0.61–1.24)
Calcium: 9.8 mg/dL (ref 8.9–10.3)
GFR calc Af Amer: 35 mL/min — ABNORMAL LOW (ref >60)
GFR calc non Af Amer: 30 mL/min — ABNORMAL LOW (ref >60)
GLUCOSE: 158 mg/dL — AB (ref 65–99)
Potassium: 3.2 mmol/L — ABNORMAL LOW (ref 3.5–5.1)
Sodium: 139 mmol/L (ref 135–145)

## 2014-08-07 LAB — CBC
HCT: 40.6 % (ref 40.0–52.0)
HEMOGLOBIN: 13.1 g/dL (ref 13.0–18.0)
MCH: 26.9 pg (ref 26.0–34.0)
MCHC: 32.4 g/dL (ref 32.0–36.0)
MCV: 83 fL (ref 80.0–100.0)
Platelets: 259 10*3/uL (ref 150–440)
RBC: 4.88 MIL/uL (ref 4.40–5.90)
RDW: 15.4 % — ABNORMAL HIGH (ref 11.5–14.5)
WBC: 16.7 10*3/uL — ABNORMAL HIGH (ref 3.8–10.6)

## 2014-08-07 LAB — GLUCOSE, CAPILLARY: GLUCOSE-CAPILLARY: 163 mg/dL — AB (ref 65–99)

## 2014-08-07 LAB — TROPONIN I: Troponin I: 0.05 ng/mL — ABNORMAL HIGH (ref <0.031)

## 2014-08-07 LAB — BRAIN NATRIURETIC PEPTIDE: B Natriuretic Peptide: 84 pg/mL (ref 0.0–100.0)

## 2014-08-07 MED ORDER — SACUBITRIL-VALSARTAN 24-26 MG PO TABS
1.0000 | ORAL_TABLET | Freq: Two times a day (BID) | ORAL | Status: DC
  Administered 2014-08-07 – 2014-08-08 (×2): 1 via ORAL
  Filled 2014-08-07 (×3): qty 1

## 2014-08-07 MED ORDER — ISOSORB DINITRATE-HYDRALAZINE 20-37.5 MG PO TABS: 1.0000 | ORAL_TABLET | Freq: Three times a day (TID) | ORAL | Status: DC

## 2014-08-07 MED ORDER — BECLOMETHASONE DIPROPIONATE 40 MCG/ACT IN AERS: 2.0000 | INHALATION_SPRAY | Freq: Every day | RESPIRATORY_TRACT | Status: DC

## 2014-08-07 MED ORDER — POTASSIUM CHLORIDE CRYS ER 10 MEQ PO TBCR
10.0000 meq | EXTENDED_RELEASE_TABLET | Freq: Two times a day (BID) | ORAL | Status: DC
  Administered 2014-08-07 – 2014-08-08 (×2): 10 meq via ORAL
  Filled 2014-08-07 (×2): qty 1

## 2014-08-07 MED ORDER — HYDRALAZINE HCL 25 MG PO TABS
37.5000 mg | ORAL_TABLET | Freq: Three times a day (TID) | ORAL | Status: DC
  Administered 2014-08-07 – 2014-08-08 (×2): 37.5 mg via ORAL
  Filled 2014-08-07 (×2): qty 2

## 2014-08-07 MED ORDER — SODIUM CHLORIDE 0.9 % IV SOLN
Freq: Once | INTRAVENOUS | Status: AC
  Administered 2014-08-07: 20:00:00 via INTRAVENOUS

## 2014-08-07 MED ORDER — CARVEDILOL 3.125 MG PO TABS
6.2500 mg | ORAL_TABLET | Freq: Two times a day (BID) | ORAL | Status: DC
  Administered 2014-08-08: 08:00:00 6.25 mg via ORAL
  Filled 2014-08-07: qty 2

## 2014-08-07 MED ORDER — POTASSIUM CHLORIDE IN NACL 20-0.9 MEQ/L-% IV SOLN
INTRAVENOUS | Status: DC
  Administered 2014-08-08: 03:00:00 via INTRAVENOUS
  Filled 2014-08-07 (×4): qty 1000

## 2014-08-07 MED ORDER — ALBUTEROL SULFATE (2.5 MG/3ML) 0.083% IN NEBU
3.0000 mL | INHALATION_SOLUTION | Freq: Four times a day (QID) | RESPIRATORY_TRACT | Status: DC | PRN
  Administered 2014-08-08: 3 mL via RESPIRATORY_TRACT
  Filled 2014-08-07: qty 3

## 2014-08-07 MED ORDER — ASPIRIN EC 81 MG PO TBEC
81.0000 mg | DELAYED_RELEASE_TABLET | Freq: Every day | ORAL | Status: DC
  Administered 2014-08-08: 81 mg via ORAL
  Filled 2014-08-07: qty 1

## 2014-08-07 MED ORDER — SIMVASTATIN 40 MG PO TABS
40.0000 mg | ORAL_TABLET | Freq: Every day | ORAL | Status: DC
  Administered 2014-08-07 – 2014-08-08 (×2): 40 mg via ORAL
  Filled 2014-08-07 (×2): qty 1

## 2014-08-07 MED ORDER — BUDESONIDE 0.25 MG/2ML IN SUSP
0.2500 mg | Freq: Two times a day (BID) | RESPIRATORY_TRACT | Status: DC
  Administered 2014-08-08: 0.25 mg via RESPIRATORY_TRACT
  Filled 2014-08-07: qty 2

## 2014-08-07 MED ORDER — ISOSORBIDE DINITRATE 20 MG PO TABS
20.0000 mg | ORAL_TABLET | Freq: Three times a day (TID) | ORAL | Status: DC
  Administered 2014-08-07 – 2014-08-08 (×2): 20 mg via ORAL
  Filled 2014-08-07 (×4): qty 1

## 2014-08-07 NOTE — ED Notes (Signed)
Pt reports sudden onset of "sweating" and weakness. Pt reports difficulty focusing. Pt answering questions appropriately but is slow to respond.

## 2014-08-07 NOTE — H&P (Signed)
Bradley Mckenzie. is an 49 y.o. male.   Chief Complaint: Feeling tired HPI: Recently seen in CHF clinic with increased LE edema and COPD exacerbation. Given zaroxalyn in addition to lasix and started on steriod taper. Has been feeling weaker progressively since then. Found to be in acute renal failure.  Past Medical History  Diagnosis Date  . Hypertension   . CHF (congestive heart failure)   . Diabetes mellitus without complication   . Obstructive sleep apnea   . Depression     Past Surgical History  Procedure Laterality Date  . Hernia repair  2004    Family History  Problem Relation Age of Onset  . Heart disease Mother   . Heart attack Father 32  . Diabetes Father   . Cancer Father   . Cancer Sister    Social History:  reports that he has quit smoking. He has never used smokeless tobacco. He reports that he uses illicit drugs (Marijuana). He reports that he does not drink alcohol.  Allergies: No Known Allergies   (Not in a hospital admission)  Results for orders placed or performed during the hospital encounter of 08/07/14 (from the past 48 hour(s))  Brain natriuretic peptide     Status: None   Collection Time: 08/07/14  6:40 PM  Result Value Ref Range   B Natriuretic Peptide 84.0 0.0 - 100.0 pg/mL  Basic metabolic panel     Status: Abnormal   Collection Time: 08/07/14  6:41 PM  Result Value Ref Range   Sodium 139 135 - 145 mmol/L   Potassium 3.2 (L) 3.5 - 5.1 mmol/L   Chloride 95 (L) 101 - 111 mmol/L   CO2 31 22 - 32 mmol/L   Glucose, Bld 158 (H) 65 - 99 mg/dL   BUN 21 (H) 6 - 20 mg/dL   Creatinine, Ser 2.41 (H) 0.61 - 1.24 mg/dL   Calcium 9.8 8.9 - 10.3 mg/dL   GFR calc non Af Amer 30 (L) >60 mL/min   GFR calc Af Amer 35 (L) >60 mL/min    Comment: (NOTE) The eGFR has been calculated using the CKD EPI equation. This calculation has not been validated in all clinical situations. eGFR's persistently <60 mL/min signify possible Chronic Kidney Disease.    Anion  gap 13 5 - 15  CBC     Status: Abnormal   Collection Time: 08/07/14  6:41 PM  Result Value Ref Range   WBC 16.7 (H) 3.8 - 10.6 K/uL   RBC 4.88 4.40 - 5.90 MIL/uL   Hemoglobin 13.1 13.0 - 18.0 g/dL   HCT 40.6 40.0 - 52.0 %   MCV 83.0 80.0 - 100.0 fL   MCH 26.9 26.0 - 34.0 pg   MCHC 32.4 32.0 - 36.0 g/dL   RDW 15.4 (H) 11.5 - 14.5 %   Platelets 259 150 - 440 K/uL  Glucose, capillary     Status: Abnormal   Collection Time: 08/07/14  7:27 PM  Result Value Ref Range   Glucose-Capillary 163 (H) 65 - 99 mg/dL   No results found.  Review of Systems  Constitutional: Negative for fever and weight loss.  HENT: Negative for hearing loss and sore throat.   Eyes: Negative for blurred vision.  Respiratory: Positive for cough. Negative for shortness of breath and wheezing.   Cardiovascular: Positive for leg swelling. Negative for chest pain.  Gastrointestinal: Negative for nausea, vomiting and diarrhea.  Genitourinary: Negative for dysuria.  Musculoskeletal: Negative for back pain and falls.  Skin: Negative for rash.  Neurological: Positive for weakness. Negative for sensory change and focal weakness.  Endo/Heme/Allergies: Does not bruise/bleed easily.    Blood pressure 118/55, pulse 88, temperature 97.8 F (36.6 C), resp. rate 16, height 5' 9"  (1.753 m), weight 131.543 kg (290 lb), SpO2 94 %. Physical Exam  Constitutional: He is oriented to person, place, and time.  Obese male in NAD  HENT:  Head: Normocephalic and atraumatic.  Mouth/Throat: No oropharyngeal exudate.  Oral mucosa dry  Eyes: EOM are normal. Pupils are equal, round, and reactive to light. No scleral icterus.  Neck: Neck supple. No JVD present. No tracheal deviation present. No thyromegaly present.  Cardiovascular: Normal rate.   No murmur heard. Respiratory: Effort normal. No respiratory distress. He has no wheezes. He exhibits no tenderness.  GI: Soft. Bowel sounds are normal. He exhibits no mass. There is no  tenderness.  Musculoskeletal: He exhibits edema. He exhibits no tenderness.  Lymphadenopathy:    He has no cervical adenopathy.  Neurological: He is alert and oriented to person, place, and time. No cranial nerve deficit.  Skin: Skin is warm and dry. No rash noted.     Assessment/Plan 1. Acute Renal failure: Likely from over diuresis. Will hold diuretics and gently hydrate.  2. Hypokalemia: Add K to IVF. Recheck in am.  3. Leukocytosis: Secondary to steriods. Will d/c steriods.  4. HTN: Continue meds.  5. COPD: Seems to over exacerbation so will stop steriods.  6. Sleep Apnea: Recent sleep study, results unknown. Has CPAP at home but needs supplies. Consult CM.  Time spent= 40 min  Baxter Hire 08/07/2014, 7:57 PM

## 2014-08-07 NOTE — ED Provider Notes (Signed)
[facility] Emergency Department Provider Note     Time seen: ----------------------------------------- 6:36 PM on 08/07/2014 -----------------------------------------    I have reviewed the triage vital signs and the nursing notes.   HISTORY  Chief Complaint Weakness    HPI Bradley Mckenzie. is a 49 y.o. male who presents ER for sudden onset sweating and weakness. He reports difficulty focusing, patient is slow to respond but otherwise is answering questions properly. Patient states this started acutely about 5:00, prior to this she was having a normal day and was having normal activity. Recently he's been treated for upper rest for infection, with Z-Pak and steroids. Also notes he's recently doubled his Lasix due to congestive heart failure. Weakness is nonspecific, nothing makes it better or worse.   Past Medical History  Diagnosis Date  . Hypertension   . CHF (congestive heart failure)   . Diabetes mellitus without complication   . Obstructive sleep apnea   . Depression     Patient Active Problem List   Diagnosis Date Noted  . COPD (chronic obstructive pulmonary disease) 08/05/2014  . Chronic systolic heart failure [id number]  . Obstructive sleep apnea 06/16/2014  . Essential hypertension 06/16/2014  . H/O respiratory system disease 02/24/2014    Past Surgical History  Procedure Laterality Date  . Hernia repair  2004    Allergies Review of patient's allergies indicates no known allergies.  Social History History  Substance Use Topics  . Smoking status: Former Research scientist (life sciences)  . Smokeless tobacco: Never Used  . Alcohol Use: No     Comment: occassional alcohol use at special events    Review of Systems Constitutional: Negative for fever. Eyes: Negative for visual changes. ENT: Negative for sore throat. Cardiovascular: Negative for chest pain. Respiratory: Negative for shortness of breath. Gastrointestinal: Negative for abdominal pain,  vomiting and diarrhea. Genitourinary: Negative for dysuria. Musculoskeletal: Negative for back pain. Skin: Negative for rash. Neurological: Positive for diffuse weakness  10-point ROS otherwise negative.  ____________________________________________   PHYSICAL EXAM:  VITAL SIGNS: ED Triage Vitals  Enc Vitals Group     BP 08/07/14 1759 118/55 mmHg     Pulse Rate 08/07/14 1759 88     Resp 08/07/14 1759 16     Temp 08/07/14 1759 97.8 F (36.6 C)     Temp src --      SpO2 08/07/14 1759 94 %     Weight 08/07/14 1759 290 lb (131.543 kg)     Height 08/07/14 1759 5\' 9"  (1.753 m)     Head Cir --      Peak Flow --      Pain Score --      Pain Loc --      Pain Edu? --      Excl. in [place]? --     Constitutional: Alert and oriented. Mild distress Eyes: Conjunctivae are normal. PERRL. Normal extraocular movements. ENT   Head: Normocephalic and atraumatic.   Nose: No congestion/rhinnorhea.   Mouth/Throat: Mucous membranes are moist.   Neck: No stridor. Cardiovascular: Normal rate, regular rhythm. Normal and symmetric distal pulses are present in all extremities. No murmurs, rubs, or gallops. Respiratory: Normal respiratory effort without tachypnea nor retractions. Breath sounds are clear and equal bilaterally. No wheezes/rales/rhonchi. Gastrointestinal: Soft and nontender. No distention. No abdominal bruits. There is no CVA tenderness. Musculoskeletal: Nontender with normal range of motion in all extremities. No joint effusions.  No lower extremity tenderness nor edema. Neurologic:  Normal speech and language. No  gross focal neurologic deficits are appreciated. Speech is normal. No gait instability. Skin:  Skin is warm, dry and intact. No rash noted. Psychiatric: Mood and affect are normal. Speech and behavior are normal. Patient exhibits appropriate insight and judgment. ____________________________________________  EKG: Interpreted by me. Normal sinus rhythm with left  axis deviation, lateral T wave inversions, rate is 82 bpm. Normal QT interval.  ____________________________________________  ED COURSE:  Pertinent labs & imaging results that were available during my care of the patient were reviewed by me and considered in my medical decision making (see chart for details). Patiently basic labs, EKG and reevaluation. ____________________________________________    LABS (pertinent positives/negatives)  Labs Reviewed  BASIC METABOLIC PANEL - Abnormal; Notable for the following:    Potassium 3.2 (*)    Chloride 95 (*)    Glucose, Bld 158 (*)    BUN 21 (*)    Creatinine, Ser 2.41 (*)    GFR calc non Af Amer 30 (*)    GFR calc Af Amer 35 (*)    All other components within normal limits  CBC - Abnormal; Notable for the following:    WBC 16.7 (*)    RDW 15.4 (*)    All other components within normal limits  GLUCOSE, CAPILLARY - Abnormal; Notable for the following:    Glucose-Capillary 163 (*)    All other components within normal limits  BRAIN NATRIURETIC PEPTIDE  URINALYSIS COMPLETEWITH MICROSCOPIC ([facility] ONLY)  TROPONIN I  CBG MONITORING, ED    ____________________________________________  FINAL ASSESSMENT AND PLAN  Weakness, acute renal failure  Plan: Patient with labs and imaging as dictated above. Patient recently increased Lasix dose, this is over diuresed him. He has acute renal failure now patient will need gentle rehydration due to an EF of around 30%. Patient will likely need observation for same and recheck of his labs tomorrow.   Earleen Newport, MD   Earleen Newport, MD 08/07/14 [phone]

## 2014-08-07 NOTE — Progress Notes (Addendum)
Notified Dr Margaretmary Eddy of Troponin 0.05. MD verbalized to repeat in 4hrs from last troponin.

## 2014-08-08 ENCOUNTER — Ambulatory Visit: Payer: Self-pay

## 2014-08-08 LAB — URINALYSIS COMPLETE WITH MICROSCOPIC (ARMC ONLY)
BILIRUBIN URINE: NEGATIVE
Bacteria, UA: NONE SEEN
Glucose, UA: >500 mg/dL — AB
HGB URINE DIPSTICK: NEGATIVE
KETONES UR: NEGATIVE mg/dL
Leukocytes, UA: NEGATIVE
Nitrite: NEGATIVE
Protein, ur: NEGATIVE mg/dL
SPECIFIC GRAVITY, URINE: 1.016 (ref 1.005–1.030)
pH: 5 (ref 5.0–8.0)

## 2014-08-08 LAB — TROPONIN I: TROPONIN I: 0.03 ng/mL (ref <0.031)

## 2014-08-08 LAB — BASIC METABOLIC PANEL
Anion gap: 12 (ref 5–15)
BUN: 25 mg/dL — ABNORMAL HIGH (ref 6–20)
CALCIUM: 9.7 mg/dL (ref 8.9–10.3)
CHLORIDE: 95 mmol/L — AB (ref 101–111)
CO2: 32 mmol/L (ref 22–32)
Creatinine, Ser: 1.59 mg/dL — ABNORMAL HIGH (ref 0.61–1.24)
GFR calc Af Amer: 58 mL/min — ABNORMAL LOW (ref >60)
GFR, EST NON AFRICAN AMERICAN: 50 mL/min — AB (ref >60)
GLUCOSE: 187 mg/dL — AB (ref 65–99)
POTASSIUM: 3.7 mmol/L (ref 3.5–5.1)
Sodium: 139 mmol/L (ref 135–145)

## 2014-08-08 LAB — CBC
HCT: 43.4 % (ref 40.0–52.0)
Hemoglobin: 13.6 g/dL (ref 13.0–18.0)
MCH: 26.3 pg (ref 26.0–34.0)
MCHC: 31.3 g/dL — ABNORMAL LOW (ref 32.0–36.0)
MCV: 84.1 fL (ref 80.0–100.0)
Platelets: 266 10*3/uL (ref 150–440)
RBC: 5.15 MIL/uL (ref 4.40–5.90)
RDW: 15.4 % — ABNORMAL HIGH (ref 11.5–14.5)
WBC: 13.9 10*3/uL — AB (ref 3.8–10.6)

## 2014-08-08 LAB — HEMOGLOBIN A1C: Hgb A1c MFr Bld: 6.9 % — ABNORMAL HIGH (ref 4.0–6.0)

## 2014-08-08 MED ORDER — ALUM & MAG HYDROXIDE-SIMETH 200-200-20 MG/5ML PO SUSP
10.0000 mL | Freq: Four times a day (QID) | ORAL | Status: DC | PRN
  Administered 2014-08-08: 10 mL via ORAL
  Filled 2014-08-08: qty 30

## 2014-08-08 MED ORDER — TORSEMIDE 20 MG PO TABS: 40.0000 mg | ORAL_TABLET | Freq: Every day | ORAL | Status: DC

## 2014-08-08 NOTE — Discharge Summary (Signed)
Bradley Mckenzie was admitted to the Hospital on 08/07/2014 and Discharged  08/08/2014 and should be excused from work/school   for 2 days starting 08/07/2014 , may return to work/school without any restrictions.  Call Fritzi Mandes MD, [facility]  [phone] with questions.  Givanni Staron M.D on 08/08/2014,at 1:41 PM

## 2014-08-08 NOTE — Progress Notes (Signed)
Initial Nutrition Assessment  INTERVENTION:   Meals and Snacks: Cater to patient preferences; pt may benefit from addition of Carb Modified Diet order Education:  RD provided "Low Sodium Nutrition Therapy" handout from the Academy of [employer]etics. Reviewed patient's dietary recall. Provided examples on ways to decrease sodium intake in diet. Discouraged intake of processed foods and use of salt shaker. Encouraged fresh fruits and vegetables as well as whole grain sources of carbohydrates to maximize fiber intake.  RD discussed why it is important for patient to adhere to diet recommendations, and emphasized the role of fluids, foods to avoid, and importance of weighing self daily. Teach back method used.  Expect good compliance.   NUTRITION DIAGNOSIS:   Food and nutrition related knowledge deficit related to chronic illness as evidenced by per patient/family report.  GOAL:   Patient will meet greater than or equal to 90% of their needs  MONITOR:    (Energy Intake, Anthropometrics, Electrolyte and renal Profile, Glucose Profile)  REASON FOR ASSESSMENT:   Diagnosis    ASSESSMENT:   Pt admitted from CHF clinic with increased LE edema and found to be in acute renal failure, likely secondary to overdiuresis, per MD note.  Past Medical History  Diagnosis Date  . Hypertension   . CHF (congestive heart failure)   . Diabetes mellitus without complication   . Obstructive sleep apnea   . Depression     Diet Order:  Diet 2 gram sodium Room service appropriate?: Yes; Fluid consistency:: Thin   Current Nutrition: Pt reports eating 100% of french toast, eggs, and fruit this am.  Food/Nutrition-Related History: Pt reports eating 3 meals per day PTA, but having early satiety at times. Pt reports eating out a lot at times PTA.   Medications: NS with KCl at 9mL/hr, KCl  Electrolyte/Renal Profile and Glucose Profile:   Recent Labs Lab 08/05/14 1227 08/07/14 1841  08/08/14 0455  NA 138 139 139  K 3.7 3.2* 3.7  CL 101 95* 95*  CO2 28 31 32  BUN 12 21* 25*  CREATININE 1.08 2.41* 1.59*  CALCIUM 9.1 9.8 9.7  GLUCOSE 132* 158* 187*   Protein Profile: No results for input(s): ALBUMIN in the last 168 hours.  Gastrointestinal Profile: Last BM: 08/07/2014   Nutrition-Focused Physical Exam Findings: Nutrition-Focused physical exam completed. Findings are WDL for fat depletion, muscle depletion, and edema.    Weight Change: Pt reports weight gain PTA   Skin:  Reviewed, no issues  Height:   Ht Readings from Last 1 Encounters:  08/07/14 5\' 9"  (1.753 m)    Weight:   Wt Readings from Last 1 Encounters:  08/07/14 294 lb (133.358 kg)    Ideal Body Weight:  72.7 kg  Wt Readings from Last 10 Encounters:  08/07/14 294 lb (133.358 kg)  08/05/14 290 lb (131.543 kg)  07/29/14 292 lb (132.45 kg)  06/16/14 286 lb (129.729 kg)  04/13/14 281 lb (127.461 kg)    BMI:  Body mass index is 43.4 kg/(m^2).  Estimated Nutritional Needs:   Kcal:  [id number] kcals, BEE: 1587kcals, TEE: (IF 1.1-1.3)(AF 1.2) using IBW of 72.7kg  Protein:  73-87g protein (1.0-1.2g/kg) using IBW of 72.7kg  Fluid:  1818-2121mL of fluid (25-82mL/kg) using IBW of 72.7kg  EDUCATION NEEDS:   Education needs addressed  [place], RD, LDN Pager [phone]

## 2014-08-08 NOTE — Discharge Summary (Signed)
[name]    MR#:  [id number]  DATE OF BIRTH:  May 05, 1965  DATE OF ADMISSION:  08/07/2014 ADMITTING PHYSICIAN: Baxter Hire, MD  DATE OF DISCHARGE:08/08/2014  PRIMARY CARE PHYSICIAN: Letta Median, MD    ADMISSION DIAGNOSIS:  Weakness [id number] Chronic systolic congestive heart failure [I50.22] Acute renal failure, unspecified acute renal failure type [N17.9]  DISCHARGE DIAGNOSIS:  Acute renal failure suspected due to overdiuresis   SECONDARY DIAGNOSIS:   Past Medical History  Diagnosis Date  . Hypertension   . CHF (congestive heart failure)   . Diabetes mellitus without complication   . Obstructive sleep apnea   . Depression     HOSPITAL COURSE:  62 yr ol male with PMH of CHF recently seen in CHF clinic with increased LE edema and COPD exacerbation. Given zaroxalyn in addition to torsemide and started on steriod taper. Has been feeling weaker progressively since then. Found to be in acute renal failure.  1. Acute Renal failure: Likely from over diuresis. Will hold diuretics and gently hydrate. -creat 2.41--->1.59 -appears well hydrated. Pt given instructions about how to take torsemide  2. Hypokalemia: Add K to IVF. [name]  3. Leukocytosis: Secondary to steriods. Pt has couple da to complete  4. HTN: Continue meds.  5. COPD: Seems to over exacerbation so will stop steriods.  6. Sleep Apnea: Recent sleep study, results unknown. Has CPAP at home but needs supplies  -overall appears stable. D/c home  DISCHARGE CONDITIONS:   fair  CONSULTS OBTAINED:   none  DRUG ALLERGIES:  No Known Allergies  DISCHARGE MEDICATIONS:   Current Discharge Medication List    CONTINUE these medications which have CHANGED   Details  torsemide (DEMADEX) 20 MG tablet Take 2 tablets (40 mg total) by mouth daily. Resume torsemide 20 mg from wednesday July 27th thru sun july 31st and then take 40 mg  daily thereafter Qty: 60 tablet, Refills: 3      CONTINUE these medications which have NOT CHANGED   Details  albuterol (PROVENTIL HFA;VENTOLIN HFA) 108 (90 BASE) MCG/ACT inhaler Inhale 2 puffs into the lungs every 6 (six) hours as needed for wheezing or shortness of breath.    aspirin 81 MG tablet Take 81 mg by mouth daily.    beclomethasone (QVAR) 40 MCG/ACT inhaler Inhale 2 puffs into the lungs daily.    carvedilol (COREG) 6.25 MG tablet Take 1 tablet (6.25 mg total) by mouth 2 (two) times daily with a meal. Qty: 60 tablet, Refills: 5    isosorbide-hydrALAZINE (BIDIL) 20-37.5 MG per tablet Take 1 tablet by mouth 3 (three) times daily. Qty: 90 tablet, Refills: 3    potassium chloride (K-DUR,KLOR-CON) 10 MEQ tablet Take 1 tablet (10 mEq total) by mouth 2 (two) times daily. Qty: 60 tablet, Refills: 3    predniSONE (DELTASONE) 10 MG tablet Take 6 tablets on day 1, 5 tablets on day 2, 4 tablets on day 3, 3 tablets on day 4, 2 tablets on day 5, 1 tablet on day 6 and then stop.   Take with food Qty: 21 tablet, Refills: 0    promethazine-dextromethorphan (PROMETHAZINE-DM) 6.25-15 MG/5ML syrup Take 5 mLs by mouth 4 (four) times daily as needed for cough. Qty: 118 mL, Refills: 0    sacubitril-valsartan (ENTRESTO) 24-26 MG Take 1 tablet by mouth 2 (two) times daily. Qty: 60 tablet, Refills: 3    simvastatin (ZOCOR) 40 MG tablet Take 1 tablet (  40 mg total) by mouth daily. Qty: 30 tablet, Refills: 3      STOP taking these medications     metolazone (ZAROXOLYN) 5 MG tablet         If you experience worsening of your admission symptoms, develop shortness of breath, life threatening emergency, suicidal or homicidal thoughts you must seek medical attention immediately by calling 911 or calling your MD immediately  if symptoms less severe.  You Must read complete instructions/literature along with all the possible adverse reactions/side effects for all the Medicines you take and that  have been prescribed to you. Take any new Medicines after you have completely understood and accept all the possible adverse reactions/side effects.   Please note  You were cared for by a hospitalist during your hospital stay. If you have any questions about your discharge medications or the care you received while you were in the hospital after you are discharged, you can call the unit and asked to speak with the hospitalist on call if the hospitalist that took care of you is not available. Once you are discharged, your primary care physician will handle any further medical issues. Please note that NO REFILLS for any discharge medications will be authorized once you are discharged, as it is imperative that you return to your primary care physician (or establish a relationship with a primary care physician if you do not have one) for your aftercare needs so that they can reassess your need for medications and monitor your lab values. Today   SUBJECTIVE  Doing well   VITAL SIGNS:  Blood pressure 154/78, pulse 93, temperature 97.6 F (36.4 C), temperature source Oral, resp. rate 18, height 5\' 9"  (1.753 m), weight 133.358 kg (294 lb), SpO2 93 %.  I/O:   Intake/Output Summary (Last 24 hours) at 08/08/14 1341 Last data filed at 08/08/14 0900  Gross per 24 hour  Intake    240 ml  Output    200 ml  Net     40 ml    PHYSICAL EXAMINATION:  GENERAL:  49 y.o.-year-old patient lying in the bed with no acute distress.  EYES: Pupils equal, round, reactive to light and accommodation. No scleral icterus. Extraocular muscles intact.  HEENT: Head atraumatic, normocephalic. Oropharynx and nasopharynx clear.  NECK:  Supple, no jugular venous distention. No thyroid enlargement, no tenderness.  LUNGS: Normal breath sounds bilaterally, no wheezing, rales,rhonchi or crepitation. No use of accessory muscles of respiration.  CARDIOVASCULAR: S1, S2 normal. No murmurs, rubs, or gallops.  ABDOMEN: Soft,  non-tender, non-distended. Bowel sounds present. No organomegaly or mass.  EXTREMITIES: No pedal edema, cyanosis, or clubbing.  NEUROLOGIC: Cranial nerves II through XII are intact. Muscle strength 5/5 in all extremities. Sensation intact. Gait not checked.  PSYCHIATRIC: The patient is alert and oriented x 3.  SKIN: No obvious rash, lesion, or ulcer.   DATA REVIEW:   CBC   Recent Labs Lab 08/08/14 0455  WBC 13.9*  HGB 13.6  HCT 43.4  PLT 266    Chemistries   Recent Labs Lab 08/08/14 0455  NA 139  K 3.7  CL 95*  CO2 32  GLUCOSE 187*  BUN 25*  CREATININE 1.59*  CALCIUM 9.7    Microbiology Results   No results found for this or any previous visit (from the past 240 hour(s)).  RADIOLOGY:  No results found.   Management plans discussed with the patient, family and they are in agreement.  CODE STATUS:     Code  Status Orders        Start     Ordered   08/07/14 2101  Full code   Continuous     08/07/14 2100      TOTAL TIME TAKING CARE OF THIS PATIENT: 40 minutes.    Wafa Martes M.D on 08/08/2014 at 1:41 PM  Between 7am to 6pm - Pager - [phone] After 6pm go to [email or web address] - password [email or web address] [place] [facility]  Office  [phone]  CC: Primary care physician; Letta Median, MD

## 2014-08-08 NOTE — Care Management (Addendum)
Admitted to [facility] with the diagnosis of acute renal failure. Lives with girlfriend, Para March  [phone]). No home Health in the past. No skilled facility. No home oxygen.Goes to [facility] for medical care. Last at [facility] a few months ago. Takes care of all activities of daily living himself, still drives.  States he has had a CPAP machine for 6-7 years, but doesn't use because the equipment is old. States he will be arranging for a second sleep study test as soon as possible. "They called last week to schedule the sleep study."  Doesn't remember who supplied the CPAP he has in the home. Girlfriend will transport. Shelbie Ammons RN MSN Care Management [phone]

## 2014-08-08 NOTE — Discharge Instructions (Signed)
Low salt diet

## 2014-08-08 NOTE — Plan of Care (Signed)
Problem: Discharge Progression Outcomes Goal: Other Discharge Outcomes/Goals Outcome: Completed/Met Date Met:  08/08/14 Received MD order to discharge patient to home, renal functions improving  No c/o pain this shift, patient did have some mild indigestion relieved well with prn Mylanta Patient is tolerating diet well Discharged to home in wheelchair with volunteers

## 2014-08-08 NOTE — Plan of Care (Signed)
Problem: Discharge Progression Outcomes Goal: Other Discharge Outcomes/Goals Outcome: Progressing Plan of care progress to goal: VSS. Denies pain. No c/o sob. Denies n/v. Tolerates diet. Troponin 0.05, MD notified, 2nd troponin 0.03. IVF infusing. BMP pending.

## 2014-08-10 ENCOUNTER — Ambulatory Visit: Payer: Self-pay

## 2014-08-11 ENCOUNTER — Ambulatory Visit: Payer: Self-pay

## 2014-08-12 ENCOUNTER — Encounter: Payer: Self-pay | Admitting: Family

## 2014-08-12 ENCOUNTER — Ambulatory Visit: Payer: No Typology Code available for payment source | Attending: Family | Admitting: Family

## 2014-08-12 VITALS — BP 156/78 | HR 85 | Resp 20 | Ht 69.0 in | Wt 294.0 lb

## 2014-08-12 DIAGNOSIS — Z7982 Long term (current) use of aspirin: Secondary | ICD-10-CM | POA: Diagnosis not present

## 2014-08-12 DIAGNOSIS — I10 Essential (primary) hypertension: Secondary | ICD-10-CM | POA: Insufficient documentation

## 2014-08-12 DIAGNOSIS — I5022 Chronic systolic (congestive) heart failure: Secondary | ICD-10-CM | POA: Diagnosis not present

## 2014-08-12 DIAGNOSIS — G4733 Obstructive sleep apnea (adult) (pediatric): Secondary | ICD-10-CM | POA: Insufficient documentation

## 2014-08-12 DIAGNOSIS — E119 Type 2 diabetes mellitus without complications: Secondary | ICD-10-CM | POA: Diagnosis not present

## 2014-08-12 DIAGNOSIS — F329 Major depressive disorder, single episode, unspecified: Secondary | ICD-10-CM | POA: Diagnosis not present

## 2014-08-12 DIAGNOSIS — Z87891 Personal history of nicotine dependence: Secondary | ICD-10-CM | POA: Diagnosis not present

## 2014-08-12 DIAGNOSIS — Z79899 Other long term (current) drug therapy: Secondary | ICD-10-CM | POA: Diagnosis not present

## 2014-08-12 NOTE — Progress Notes (Signed)
Subjective:    Patient ID: Bradley Pitts., male    DOB: [birth year], 49 y.o.   MRN: [id number]  Congestive Heart Failure Presents for follow-up visit. The disease course has been improving. Associated symptoms include edema and fatigue. Pertinent negatives include no abdominal pain, chest pain, chest pressure, orthopnea, palpitations, paroxysmal nocturnal dyspnea or shortness of breath. The symptoms have been improving. Past treatments include angiotensin receptor blockers, beta blockers and salt and fluid restriction. The treatment provided moderate relief. Compliance with prior treatments has been good. His past medical history is significant for HTN. Compliance with total regimen is 76-100%.  Other This is a recurrent (edema) problem. The problem occurs daily. The problem has been gradually improving. Associated symptoms include coughing and fatigue. Pertinent negatives include no abdominal pain, chest pain, congestion, fever, headaches, neck pain or urinary symptoms. The symptoms are aggravated by walking and standing. He has tried position changes for the symptoms. The treatment provided moderate relief.    Past Medical History  Diagnosis Date  . Hypertension   . CHF (congestive heart failure)   . Diabetes mellitus without complication   . Obstructive sleep apnea   . Depression     Past Surgical History  Procedure Laterality Date  . Hernia repair  2004    Family History  Problem Relation Age of Onset  . Heart disease Mother   . Heart attack Father 31  . Diabetes Father   . Cancer Father   . Cancer Sister     History  Substance Use Topics  . Smoking status: Former Research scientist (life sciences)  . Smokeless tobacco: Never Used  . Alcohol Use: No     Comment: occassional alcohol use at special events    No Known Allergies  Prior to Admission medications   Medication Sig Start Date End Date Taking? Authorizing Provider  albuterol (PROVENTIL HFA;VENTOLIN HFA) 108 (90 BASE) MCG/ACT inhaler  Inhale 2 puffs into the lungs every 6 (six) hours as needed for wheezing or shortness of breath.   Yes Historical Provider, MD  aspirin 81 MG tablet Take 81 mg by mouth daily.   Yes Historical Provider, MD  beclomethasone (QVAR) 40 MCG/ACT inhaler Inhale 2 puffs into the lungs daily.   Yes Historical Provider, MD  carvedilol (COREG) 6.25 MG tablet Take 1 tablet (6.25 mg total) by mouth 2 (two) times daily with a meal. 07/29/14  Yes Alisa Graff, FNP  isosorbide-hydrALAZINE (BIDIL) 20-37.5 MG per tablet Take 1 tablet by mouth 3 (three) times daily. 07/29/14  Yes Alisa Graff, FNP  potassium chloride (K-DUR,KLOR-CON) 10 MEQ tablet Take 1 tablet (10 mEq total) by mouth 2 (two) times daily. 07/29/14  Yes Alisa Graff, FNP  predniSONE (DELTASONE) 10 MG tablet Take 6 tablets on day 1, 5 tablets on day 2, 4 tablets on day 3, 3 tablets on day 4, 2 tablets on day 5, 1 tablet on day 6 and then stop.   Take with food 08/05/14  Yes Alisa Graff, FNP  promethazine-dextromethorphan (PROMETHAZINE-DM) 6.25-15 MG/5ML syrup Take 5 mLs by mouth 4 (four) times daily as needed for cough. 08/03/14  Yes Sable Feil, PA-C  sacubitril-valsartan (ENTRESTO) 24-26 MG Take 1 tablet by mouth 2 (two) times daily. 07/29/14  Yes Alisa Graff, FNP  simvastatin (ZOCOR) 40 MG tablet Take 1 tablet (40 mg total) by mouth daily. 07/29/14  Yes Alisa Graff, FNP  torsemide (DEMADEX) 20 MG tablet Take 2 tablets (40 mg total) by mouth daily. Resume  torsemide 20 mg from wednesday July 27th thru sun july 31st and then take 40 mg daily thereafter 08/08/14  Yes Fritzi Mandes, MD                                                                                            Review of Systems  Constitutional: Positive for fatigue. Negative for fever and appetite change.  HENT: Negative for congestion, rhinorrhea and trouble swallowing.   Eyes: Negative.   Respiratory: Positive for cough and wheezing. Negative for chest tightness  and shortness of breath.   Cardiovascular: Positive for leg swelling. Negative for chest pain and palpitations.  Gastrointestinal: Positive for abdominal distention. Negative for abdominal pain.  Endocrine: Negative.   Genitourinary: Negative.   Musculoskeletal: Negative.  Negative for neck pain.  Skin: Negative.   Allergic/Immunologic: Negative.   Neurological: Negative for dizziness, light-headedness and headaches.  Hematological: Negative for adenopathy. Does not bruise/bleed easily.  Psychiatric/Behavioral: Positive for sleep disturbance (sleeping in recliner). Negative for dysphoric mood.       Objective:   Physical Exam  Constitutional: He is oriented to person, place, and time. He appears well-developed and well-nourished.  HENT:  Head: Normocephalic and atraumatic.  Eyes: Conjunctivae are normal. Pupils are equal, round, and reactive to light.  Neck: Normal range of motion. Neck supple.  Cardiovascular: Normal rate and regular rhythm.   Pulmonary/Chest: Effort normal. He has no wheezes. He has no rales.  Abdominal: Soft. He exhibits distension. There is no tenderness.  Musculoskeletal: He exhibits edema (1+ bilateral lower legs). He exhibits no tenderness.  Neurological: He is alert and oriented to person, place, and time.  Skin: Skin is warm and dry.  Psychiatric: He has a normal mood and affect. His behavior is normal. Thought content normal.  Nursing note and vitals reviewed.      BP 156/78 mmHg  Pulse 85  Resp 20  Ht 5\' 9"  (1.753 m)  Wt 294 lb (133.358 kg)  BMI 43.40 kg/m2  SpO2 98%  Assessment & Plan:  1: Chronic heart failure with reduced ejection fraction- Patient presents with continued fatigue with exertion along with some edema in his lower legs. He says that his diuretic has been adjusted because he ended up in the hospital with weakness and his renal function had worsened. Was given 1 dose of 5mg  zaroxolyn here at his last visit. Currently taking 20mg   torsemide daily until 08/15/14 when he will resume his 40mg  dosage. Is trying to elevate his legs when at home. Continues to weigh himself daily and he was reminded to call for an overnight weight gain of >2 pounds or a weekly weight gain of >5 pounds. He is not adding any salt to his food and is trying to closely follow a low sodium diet. Continue medications at this time. Would like to increase entresto after things settle down. 2: HTN- Blood pressure mildly elevated but his diuretic has been decreased. Continue to monitor. 3: Sleep apnea- He says that he has to reschedule his sleep study. Encouraged him to do this as soon as he can.  Return in 1 month or sooner for  any questions/problems before then.

## 2014-08-12 NOTE — Patient Instructions (Signed)
Continue weighing daily and call for an overnight weight gain of > 2 pounds or a weekly weight gain of >5 pounds.  Continue to follow a low sodium diet.   Elevate legs when at home.

## 2014-09-12 ENCOUNTER — Encounter: Payer: Self-pay | Admitting: Family

## 2014-09-12 ENCOUNTER — Ambulatory Visit: Payer: No Typology Code available for payment source | Attending: Family | Admitting: Family

## 2014-09-12 VITALS — BP 127/72 | HR 97 | Resp 20 | Ht 69.0 in | Wt 291.0 lb

## 2014-09-12 DIAGNOSIS — G4730 Sleep apnea, unspecified: Secondary | ICD-10-CM | POA: Insufficient documentation

## 2014-09-12 DIAGNOSIS — E119 Type 2 diabetes mellitus without complications: Secondary | ICD-10-CM | POA: Diagnosis not present

## 2014-09-12 DIAGNOSIS — F329 Major depressive disorder, single episode, unspecified: Secondary | ICD-10-CM | POA: Diagnosis not present

## 2014-09-12 DIAGNOSIS — I10 Essential (primary) hypertension: Secondary | ICD-10-CM | POA: Diagnosis not present

## 2014-09-12 DIAGNOSIS — Z87891 Personal history of nicotine dependence: Secondary | ICD-10-CM | POA: Insufficient documentation

## 2014-09-12 DIAGNOSIS — G4733 Obstructive sleep apnea (adult) (pediatric): Secondary | ICD-10-CM | POA: Insufficient documentation

## 2014-09-12 DIAGNOSIS — R000 Tachycardia, unspecified: Secondary | ICD-10-CM | POA: Insufficient documentation

## 2014-09-12 DIAGNOSIS — R0602 Shortness of breath: Secondary | ICD-10-CM | POA: Insufficient documentation

## 2014-09-12 DIAGNOSIS — Z7982 Long term (current) use of aspirin: Secondary | ICD-10-CM | POA: Diagnosis not present

## 2014-09-12 DIAGNOSIS — Z79899 Other long term (current) drug therapy: Secondary | ICD-10-CM | POA: Diagnosis not present

## 2014-09-12 DIAGNOSIS — I5022 Chronic systolic (congestive) heart failure: Secondary | ICD-10-CM

## 2014-09-12 NOTE — Patient Instructions (Signed)
Continue weighing daily and call for an overnight weight gain of > 2 pounds or a weekly weight gain of >5 pounds.     Low-Sodium Eating Plan Sodium raises blood pressure and causes water to be held in the body. Getting less sodium from food will help lower your blood pressure, reduce any swelling, and protect your heart, liver, and kidneys. We get sodium by adding salt (sodium chloride) to food. Most of our sodium comes from canned, boxed, and frozen foods. Restaurant foods, fast foods, and pizza are also very high in sodium. Even if you take medicine to lower your blood pressure or to reduce fluid in your body, getting less sodium from your food is important. WHAT IS MY PLAN? Most people should limit their sodium intake to 2,300 mg a day. Your health care provider recommends that you limit your sodium intake to _2000mg __ a day.  WHAT DO I NEED TO KNOW ABOUT THIS EATING PLAN? For the low-sodium eating plan, you will follow these general guidelines:  Choose foods with a % Daily Value for sodium of less than 5% (as listed on the food label).   Use salt-free seasonings or herbs instead of table salt or sea salt.   Check with your health care provider or pharmacist before using salt substitutes.   Eat fresh foods.  Eat more vegetables and fruits.  Limit canned vegetables. If you do use them, rinse them well to decrease the sodium.   Limit cheese to 1 oz (28 g) per day.   Eat lower-sodium products, often labeled as "lower sodium" or "no salt added."  Avoid foods that contain monosodium glutamate (MSG). MSG is sometimes added to [place] food and some canned foods.  Check food labels (Nutrition Facts labels) on foods to learn how much sodium is in one serving.  Eat more home-cooked food and less restaurant, buffet, and fast food.  When eating at a restaurant, ask that your food be prepared with less salt or none, if possible.  HOW DO I READ FOOD LABELS FOR SODIUM  INFORMATION? The Nutrition Facts label lists the amount of sodium in one serving of the food. If you eat more than one serving, you must multiply the listed amount of sodium by the number of servings. Food labels may also identify foods as:  Sodium free--Less than 5 mg in a serving.  Very low sodium--35 mg or less in a serving.  Low sodium--140 mg or less in a serving.  Light in sodium--50% less sodium in a serving. For example, if a food that usually has 300 mg of sodium is changed to become light in sodium, it will have 150 mg of sodium.  Reduced sodium--25% less sodium in a serving. For example, if a food that usually has 400 mg of sodium is changed to reduced sodium, it will have 300 mg of sodium. WHAT FOODS CAN I EAT? Grains Low-sodium cereals, including oats, puffed wheat and rice, and shredded wheat cereals. Low-sodium crackers. Unsalted rice and pasta. Lower-sodium bread.  Vegetables Frozen or fresh vegetables. Low-sodium or reduced-sodium canned vegetables. Low-sodium or reduced-sodium tomato sauce and paste. Low-sodium or reduced-sodium tomato and vegetable juices.  Fruits Fresh, frozen, and canned fruit. Fruit juice.  Meat and Other Protein Products Low-sodium canned tuna and salmon. Fresh or frozen meat, poultry, seafood, and fish. Lamb. Unsalted nuts. Dried beans, peas, and lentils without added salt. Unsalted canned beans. Homemade soups without salt. Eggs.  Dairy Milk. Soy milk. Ricotta cheese. Low-sodium or reduced-sodium cheeses. Yogurt.  Condiments Fresh and dried herbs and spices. Salt-free seasonings. Onion and garlic powders. Low-sodium varieties of mustard and ketchup. Lemon juice.  Fats and Oils Reduced-sodium salad dressings. Unsalted butter.  Other Unsalted popcorn and pretzels.  The items listed above may not be a complete list of recommended foods or beverages. Contact your dietitian for more options. WHAT FOODS ARE NOT  RECOMMENDED? Grains Instant hot cereals. Bread stuffing, pancake, and biscuit mixes. Croutons. Seasoned rice or pasta mixes. Noodle soup cups. Boxed or frozen macaroni and cheese. Self-rising flour. Regular salted crackers. Vegetables Regular canned vegetables. Regular canned tomato sauce and paste. Regular tomato and vegetable juices. Frozen vegetables in sauces. Salted french fries. Olives. Angie Fava. Relishes. Sauerkraut. Salsa. Meat and Other Protein Products Salted, canned, smoked, spiced, or pickled meats, seafood, or fish. Bacon, ham, sausage, hot dogs, corned beef, chipped beef, and packaged luncheon meats. Salt pork. Jerky. Pickled herring. Anchovies, regular canned tuna, and sardines. Salted nuts. Dairy Processed cheese and cheese spreads. Cheese curds. Blue cheese and cottage cheese. Buttermilk.  Condiments Onion and garlic salt, seasoned salt, table salt, and sea salt. Canned and packaged gravies. Worcestershire sauce. Tartar sauce. Barbecue sauce. Teriyaki sauce. Soy sauce, including reduced sodium. Steak sauce. Fish sauce. Oyster sauce. Cocktail sauce. Horseradish. Regular ketchup and mustard. Meat flavorings and tenderizers. Bouillon cubes. Hot sauce. Tabasco sauce. Marinades. Taco seasonings. Relishes. Fats and Oils Regular salad dressings. Salted butter. Margarine. Ghee. Bacon fat.  Other Potato and tortilla chips. Corn chips and puffs. Salted popcorn and pretzels. Canned or dried soups. Pizza. Frozen entrees and pot pies.  The items listed above may not be a complete list of foods and beverages to avoid. Contact your dietitian for more information. Document Released: 06/22/2001 Document Revised: 01/05/2013 Document Reviewed: 11/04/2012 [facility] Patient Information 2015 [place], [place]. This information is not intended to replace advice given to you by your health care provider. Make sure you discuss any questions you have with your health care provider.

## 2014-09-12 NOTE — Progress Notes (Signed)
Subjective:    Patient ID: Bradley Pitts., male    DOB: Apr 26, 1965, 49 y.o.   MRN: [id number]  Congestive Heart Failure Presents for follow-up visit. The disease course has been stable. Associated symptoms include edema, fatigue, orthopnea and shortness of breath. Pertinent negatives include no abdominal pain, chest pain, chest pressure or palpitations. The symptoms have been stable. Past treatments include angiotensin receptor blockers, beta blockers and salt and fluid restriction. The treatment provided mild relief. Compliance with prior treatments has been good. His past medical history is significant for HTN.  Other This is a chronic (edema) problem. The current episode started more than 1 year ago. The problem occurs daily. The problem has been unchanged. Associated symptoms include fatigue, nausea and a rash (blisters on both shins). Pertinent negatives include no abdominal pain, chest pain, congestion, coughing, headaches, neck pain or sore throat. Nothing aggravates the symptoms. He has tried position changes for the symptoms. The treatment provided mild relief.   Past Medical History  Diagnosis Date  . Hypertension   . CHF (congestive heart failure)   . Diabetes mellitus without complication   . Obstructive sleep apnea   . Depression    Past Surgical History  Procedure Laterality Date  . Hernia repair  2004    Family History  Problem Relation Age of Onset  . Heart disease Mother   . Heart attack Father 18  . Diabetes Father   . Cancer Father   . Cancer Sister    Social History  Substance Use Topics  . Smoking status: Former Research scientist (life sciences)  . Smokeless tobacco: Never Used  . Alcohol Use: No     Comment: occassional alcohol use at special events    No Known Allergies  Prior to Admission medications   Medication Sig Start Date End Date Taking? Authorizing Provider  albuterol (PROVENTIL HFA;VENTOLIN HFA) 108 (90 BASE) MCG/ACT inhaler Inhale 2 puffs into the lungs every 6  (six) hours as needed for wheezing or shortness of breath.   Yes Historical Provider, MD  aspirin 81 MG tablet Take 81 mg by mouth daily.   Yes Historical Provider, MD  beclomethasone (QVAR) 40 MCG/ACT inhaler Inhale 2 puffs into the lungs daily.   Yes Historical Provider, MD  carvedilol (COREG) 6.25 MG tablet Take 1 tablet (6.25 mg total) by mouth 2 (two) times daily with a meal. 07/29/14  Yes Alisa Graff, FNP  isosorbide-hydrALAZINE (BIDIL) 20-37.5 MG per tablet Take 1 tablet by mouth 3 (three) times daily. 07/29/14  Yes Alisa Graff, FNP  potassium chloride (K-DUR,KLOR-CON) 10 MEQ tablet Take 1 tablet (10 mEq total) by mouth 2 (two) times daily. 07/29/14  Yes Alisa Graff, FNP  sacubitril-valsartan (ENTRESTO) 24-26 MG Take 1 tablet by mouth 2 (two) times daily. 07/29/14  Yes Alisa Graff, FNP  simvastatin (ZOCOR) 40 MG tablet Take 1 tablet (40 mg total) by mouth daily. 07/29/14  Yes Alisa Graff, FNP  torsemide (DEMADEX) 20 MG tablet Take 2 tablets (40 mg total) by mouth daily. Resume torsemide 20 mg from wednesday July 27th thru sun july 31st and then take 40 mg daily thereafter 08/08/14  Yes Fritzi Mandes, MD      Review of Systems  Constitutional: Positive for fatigue. Negative for appetite change.  HENT: Negative for congestion, postnasal drip and sore throat.   Eyes: Negative.   Respiratory: Positive for shortness of breath. Negative for cough, chest tightness and wheezing.   Cardiovascular: Positive for leg swelling. Negative for  chest pain and palpitations.  Gastrointestinal: Positive for nausea and abdominal distention. Negative for abdominal pain.  Endocrine: Negative.   Genitourinary: Negative.   Musculoskeletal: Positive for back pain (chronic). Negative for neck pain.  Skin: Positive for rash (blisters on both shins) and wound (blisters on both legs). Negative for color change.  Allergic/Immunologic: Negative.   Neurological: Negative for dizziness, light-headedness and  headaches.  Hematological: Negative for adenopathy. Does not bruise/bleed easily.  Psychiatric/Behavioral: Positive for sleep disturbance (sleeping with 4 pillows in bed or in recliner). Negative for dysphoric mood. The patient is not nervous/anxious.        Objective:   Physical Exam  Constitutional: He is oriented to person, place, and time. He appears well-developed and well-nourished.  HENT:  Head: Normocephalic and atraumatic.  Eyes: Conjunctivae are normal. Pupils are equal, round, and reactive to light.  Neck: Normal range of motion. Neck supple.  Cardiovascular: Regular rhythm.  Tachycardia present.   Pulmonary/Chest: Effort normal. He has no wheezes. He has no rales.  Abdominal: He exhibits distension. There is no tenderness.  Musculoskeletal: He exhibits edema (1+ bilateral lower legs). He exhibits no tenderness.  Neurological: He is alert and oriented to person, place, and time.  Skin: Rash noted. Rash is vesicular (healing blisters on both shins).  Psychiatric: He has a normal mood and affect. His behavior is normal. Thought content normal.  Nursing note and vitals reviewed.  BP 127/72 mmHg  Pulse 97  Resp 20  Ht 5\' 9"  (1.753 m)  Wt 291 lb (131.997 kg)  BMI 42.95 kg/m2  SpO2 99%        Assessment & Plan:  1: Chronic heart failure with reduced ejection fraction- Patient presents with continued fatigue and shortness of breath upon exertion. He says that he was a little short of breath upon walking into the office but once he sits down to rest, he recovers quickly. Continues to have some edema in both of his lower legs and abdomen. Does elevate his legs at home as much as possible and he says that they are down some in the mornings. Did discuss increasing torsemide or adding spironolactone although his weight is down from last time. Will continue current torsemide dose for now. Feels like he's drinking between 1-2 L of fluid daily. Continues to weigh and he was reminded to  call for an overnight weight gain of >2 pounds or a weekly weight gain of >5 pounds. He is not adding salt to his food and is reading food labels but says it's difficult because he can't afford all the low salt items that he should eat. Insurance will not approve his entresto even after appeals so will change him back to his lisinopril. He can finish out the entresto samples and then begin his lisinopril daily. Will send the new prescription in later as he says that he has about a week's worth left. He is going to call and see about getting set up with cardiac rehab. 2: Sleep apnea- He has recently changed his hours at work and wants to get his titration sleep study set back up. Will have our office call to see about getting that scheduled. Discussed how untreated sleep apnea affects everything and this could be why he's tachycardic and continuing to retain fluid. Continues to snore. 3: HTN- Blood pressure looks great today. 4: Tachycardia- Heart rate in the 90's today. Discussed increasing carvedilol and/or adding corlanor if needed. Will consider doing that at next visit.  Return in 2  months or sooner for any questions/problems before then.

## 2014-09-13 ENCOUNTER — Other Ambulatory Visit: Payer: Self-pay | Admitting: Family

## 2014-09-13 MED ORDER — LISINOPRIL 20 MG PO TABS: 20.0000 mg | ORAL_TABLET | Freq: Every day | ORAL | Status: DC

## 2014-10-03 ENCOUNTER — Encounter: Payer: No Typology Code available for payment source | Attending: Internal Medicine | Admitting: *Deleted

## 2014-10-03 DIAGNOSIS — I5022 Chronic systolic (congestive) heart failure: Secondary | ICD-10-CM | POA: Diagnosis not present

## 2014-10-03 NOTE — Progress Notes (Signed)
Daily Session Note  Patient Details  Name: Bradley Mckenzie. MRN: [id number] Date of Birth: 12-25-1965 Referring Provider:  Corey Skains, MD  Encounter Date: 10/03/2014  Check In:     Session Check In - 10/03/14 1802    Check-In   Staff Present Heath Lark RN, BSN, CCRP;Stephanie Mcglone BS, ACSM CEP Exercise Physiologist;Steven Way BS, ACSM EP-C, Exercise Physiologist   Medication changes reported     Yes   Comments Will bring updated medication list to next session   Fall or balance concerns reported    No   Warm-up and Cool-down Performed on first and last piece of equipment   VAD Patient? No   Pain Assessment   Currently in Pain? No/denies   Multiple Pain Sites No           Exercise Prescription Changes - 10/03/14 1800    Response to Exercise   Blood Pressure (Admit) 180/104 mmHg  Initial BP was 200/110, but was brought down with rest   Blood Pressure (Exercise) 196/94 mmHg  BP on TM was 240/110. Patient was moved to NS with no arms.   Blood Pressure (Exit) 196/106 mmHg      Goals Met:  Independence with exercise equipment Exercise tolerated well No report of cardiac concerns or symptoms Strength training completed today  Goals Unmet:  Not Applicable  Goals Comments: Patient's blood pressure was elevated upon arrival and with rest came down in range to exercise. Blood pressure remained elevated but within range to participate in exercise class. Blood pressure will be monitored by staff over the next several sessions and sent to patient's doctor. Patient has an appointment with doctor in late October.    Dr. Emily Filbert is Medical Director for [place] and [facility].

## 2014-10-06 VITALS — Ht 69.0 in | Wt 285.7 lb

## 2014-10-06 DIAGNOSIS — I5022 Chronic systolic (congestive) heart failure: Secondary | ICD-10-CM | POA: Diagnosis not present

## 2014-10-06 NOTE — Progress Notes (Signed)
Daily Session Note  Patient Details  Name: Kazim Corrales. MRN: [id number] Date of Birth: 1965-08-12 Referring Provider:  Corey Skains, MD  Encounter Date: 10/06/2014  Check In:     Session Check In - 10/06/14 1616    Check-In   Staff Present Lestine Box BS, ACSM EP-C, Exercise Physiologist;Carroll Enterkin RN, BSN;Diane Joya Gaskins RN, BSN   ER physicians immediately available to respond to emergencies See telemetry face sheet for immediately available ER MD   Medication changes reported     No   Fall or balance concerns reported    No   Warm-up and Cool-down Performed on first and last piece of equipment   VAD Patient? No   Pain Assessment   Currently in Pain? No/denies         Goals Met:  Proper associated with RPD/PD & O2 Sat Exercise tolerated well No report of cardiac concerns or symptoms Strength training completed today  Goals Unmet:  Not Applicable  Goals Comments:    Dr. Emily Filbert is Medical Director for [place] and [facility].

## 2014-10-06 NOTE — Addendum Note (Signed)
Addended by: Felipe Drone on: 10/06/2014 06:19 PM   Modules accepted: Medications

## 2014-10-07 NOTE — Progress Notes (Signed)
Cardiac Individual Treatment Plan  Patient Details  Name: Bradley Mckenzie. MRN: [id number] Date of Birth: 12-03-1965 Referring Provider:  Corey Skains, MD  Initial Encounter Date:    Visit Diagnosis: Chronic systolic heart failure  Patient's Home Medications on Admission:  Current outpatient prescriptions:  .  albuterol (PROVENTIL HFA;VENTOLIN HFA) 108 (90 BASE) MCG/ACT inhaler, Inhale 2 puffs into the lungs every 6 (six) hours as needed for wheezing or shortness of breath., Disp: , Rfl:  .  aspirin 81 MG tablet, Take 81 mg by mouth daily., Disp: , Rfl:  .  beclomethasone (QVAR) 40 MCG/ACT inhaler, Inhale 2 puffs into the lungs daily., Disp: , Rfl:  .  carvedilol (COREG) 6.25 MG tablet, Take 1 tablet (6.25 mg total) by mouth 2 (two) times daily with a meal., Disp: 60 tablet, Rfl: 5 .  isosorbide-hydrALAZINE (BIDIL) 20-37.5 MG per tablet, Take 1 tablet by mouth 3 (three) times daily., Disp: 90 tablet, Rfl: 3 .  potassium chloride (K-DUR,KLOR-CON) 10 MEQ tablet, Take 1 tablet (10 mEq total) by mouth 2 (two) times daily., Disp: 60 tablet, Rfl: 3 .  sacubitril-valsartan (ENTRESTO) 24-26 MG, Take 1 tablet by mouth 2 (two) times daily., Disp: , Rfl:  .  simvastatin (ZOCOR) 40 MG tablet, Take 1 tablet (40 mg total) by mouth daily., Disp: 30 tablet, Rfl: 3 .  torsemide (DEMADEX) 20 MG tablet, Take 2 tablets (40 mg total) by mouth daily. Resume torsemide 20 mg from wednesday July 27th thru sun july 31st and then take 40 mg daily thereafter, Disp: 60 tablet, Rfl: 3 .  lisinopril (PRINIVIL,ZESTRIL) 20 MG tablet, Take 1 tablet (20 mg total) by mouth daily. (Patient not taking: Reported on 10/06/2014), Disp: 90 tablet, Rfl: 3  Past Medical History: Past Medical History  Diagnosis Date  . Hypertension   . CHF (congestive heart failure)   . Diabetes mellitus without complication   . Obstructive sleep apnea   . Depression     Tobacco Use: History  Smoking status  . Former Smoker   Smokeless tobacco  . Never Used    Labs: Recent [employer] for ITP Cardiac and Pulmonary Rehab Latest Ref Rng 02/08/2014 08/08/2014   Cholestrol 0-200 mg/dL 161 -   LDLCALC 0-100 mg/dL 112(H) -   HDL 40-60 mg/dL 33(L) -   Trlycerides 0-200 mg/dL 79 -   Hemoglobin A1c 4.0 - 6.0 % - 6.9(H)       Exercise Target Goals:    Exercise Program Goal: Individual exercise prescription set with THRR, safety & activity barriers. Participant demonstrates ability to understand and report RPE using BORG scale, to self-measure pulse accurately, and to acknowledge the importance of the exercise prescription.  Exercise Prescription Goal: Starting with aerobic activity 30 plus minutes a day, 3 days per week for initial exercise prescription. Provide home exercise prescription and guidelines that participant acknowledges understanding prior to discharge.  Activity Barriers & Risk Stratification:   6 Minute Walk:     6 Minute Walk      04/28/14 1525       6 Minute Walk   Phase Initial     Distance 1510 feet  Initial results posted in [facility] prior to EPIC>     Walk Time 6 minutes     Resting HR 96 bpm     Resting BP 172/90 mmHg     Max Ex. HR 144 bpm     Max Ex. BP 282/102 mmHg  [name] had  not taken his 3pm dose of BIDIL 37.$RemoveBef'5mg'qsahHSfRYO$ -$Remove'20mg'bRgFcuL$  which he takes TID 9am,3pm, and 9pm     RPE 13     Perceived Dyspnea  --  Pulse ox pre walk RA 94%     Symptoms No        Initial Exercise Prescription:   Exercise Prescription Changes:     Exercise Prescription Changes      10/03/14 1800           Response to Exercise   Blood Pressure (Admit) 180/104 mmHg  Initial BP was 200/110, but was brought down with rest       Blood Pressure (Exercise) 196/94 mmHg  BP on TM was 240/110. Patient was moved to NS with no arms.       Blood Pressure (Exit) 196/106 mmHg          Discharge Exercise Prescription (Final Exercise Prescription Changes):     Exercise Prescription  Changes - 10/03/14 1800    Response to Exercise   Blood Pressure (Admit) 180/104 mmHg  Initial BP was 200/110, but was brought down with rest   Blood Pressure (Exercise) 196/94 mmHg  BP on TM was 240/110. Patient was moved to NS with no arms.   Blood Pressure (Exit) 196/106 mmHg      Nutrition:  Target Goals: Understanding of nutrition guidelines, daily intake of sodium '1500mg'$ , cholesterol '200mg'$ , calories 30% from fat and 7% or less from saturated fats, daily to have 5 or more servings of fruits and vegetables.  Biometrics:     Pre Biometrics - 04/28/14 1221    Pre Biometrics   Waist Circumference 46 inches   Hip Circumference 49.5 inches   Waist to Hip Ratio 0.93 %       Nutrition Therapy Plan and Nutrition Goals:     Nutrition Therapy & Goals - 04/28/14 1220    Nutrition Therapy   Drug/Food Interactions Statins/Certain Fruits   Intervention Plan   Intervention Using nutrition plan and personal goals to gain a healthy nutrition lifestyle. Add exercise as prescribed.      Nutrition Discharge: Rate Your Plate Scores:   Nutrition Goals Re-Evaluation:   Psychosocial: Target Goals: Acknowledge presence or absence of depression, maximize coping skills, provide positive support system. Participant is able to verbalize types and ability to use techniques and skills needed for reducing stress and depression.  Initial Review & Psychosocial Screening:     Initial Psych Review & Screening - 04/28/14 1222    Initial Review   Current issues with Current Stress Concerns   Family Dynamics   Good Support System? Yes   Screening Interventions   Interventions Encouraged to exercise      Quality of Life Scores:     Quality of Life - 04/28/14 1219    Quality of Life Scores   Health/Function Pre 13.9 %   Socioeconomic Pre 9.29 %   Psych/Spiritual Pre 17.21 %   Family Pre 23.6 %   GLOBAL Pre 15.06 %      PHQ-9:     Recent Review Flowsheet Data    Depression  screen [facility] 2/9 09/12/2014 08/12/2014 08/05/2014 07/29/2014 06/16/2014   Decreased Interest 0 0 0 0 0   Down, Depressed, Hopeless 0 0 0 0 0   PHQ - 2 Score 0 0 0 0 0      Psychosocial Evaluation and Intervention:     Psychosocial Evaluation - 10/03/14 1737    Psychosocial Evaluation & Interventions   Interventions Encouraged to exercise  with the program and follow exercise prescription   Comments Counselor met with Mr. Kettles today for initial psychosocial evaluation.  He is a 49 year old who has been diagnosed with Congestive Heart Failure.  he also has other health issues including Asthma, Bronchitis and Sleep Apnea.  Mr. Rinker reports he has a good support system with his significant other and a brother who lives close by.  He states he is not sleeping well and is scheduled for a follow-up sleep study soon to address this.  His appetite is good and he denies a history of depression or anxiety or current symptoms.  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  His goals are to lost weight, get stronger and decrease the number of medications he is taking while in this program.  Counselor will follow up with Mr. Darene Lamer re: his sleep study and recommendations.     Continued Psychosocial Services Needed Yes  Mr. Hard will benefit from the psychoeducational components of this program, especially stress management since he admits his job is very stressful.  He will also benefit from meeting with the dietician to address his weight loss goals.       Psychosocial Re-Evaluation:   Vocational Rehabilitation: Provide vocational rehab assistance to qualifying candidates.   Vocational Rehab Evaluation & Intervention:   Education: Education Goals: Education classes will be provided on a weekly basis, covering required topics. Participant will state understanding/return demonstration of topics presented.  Learning  Barriers/Preferences:     Learning Barriers/Preferences - 04/28/14 1219    Learning Barriers/Preferences   Learning Barriers None   Learning Preferences None      Education Topics: General Nutrition Guidelines/Fats and Fiber: -Group instruction provided by verbal, written material, models and posters to present the general guidelines for heart healthy nutrition. Gives an explanation and review of dietary fats and fiber.   Controlling Sodium/Reading Food Labels: -Group verbal and written material supporting the discussion of sodium use in heart healthy nutrition. Review and explanation with models, verbal and written materials for utilization of the food label.   Exercise Physiology & Risk Factors: - Group verbal and written instruction with models to review the exercise physiology of the cardiovascular system and associated critical values. Details cardiovascular disease risk factors and the goals associated with each risk factor.   Aerobic Exercise & Resistance Training: - Gives group verbal and written discussion on the health impact of inactivity. On the components of aerobic and resistive training programs and the benefits of this training and how to safely progress through these programs.   Flexibility, Balance, General Exercise Guidelines: - Provides group verbal and written instruction on the benefits of flexibility and balance training programs. Provides general exercise guidelines with specific guidelines to those with heart or lung disease. Demonstration and skill practice provided.          Cardiac Rehab from 10/06/2014 in [facility]   Date  10/03/14   Educator  SW   Instruction Review Code  2- meets goals/outcomes      Stress Management: - Provides group verbal and written instruction about the health risks of elevated stress, cause of high stress, and healthy ways to reduce stress.   Depression: - Provides group verbal and written instruction on the  correlation between heart/lung disease and depressed mood, treatment options, and the stigmas associated with seeking treatment.   Anatomy & Physiology of the Heart: - Group verbal  and written instruction and models provide basic cardiac anatomy and physiology, with the coronary electrical and arterial systems. Review of: AMI, Angina, Valve disease, Heart Failure, Cardiac Arrhythmia, Pacemakers, and the ICD.   Cardiac Procedures: - Group verbal and written instruction and models to describe the testing methods done to diagnose heart disease. Reviews the outcomes of the test results. Describes the treatment choices: Medical Management, Angioplasty, or Coronary Bypass Surgery.   Cardiac Medications: - Group verbal and written instruction to review commonly prescribed medications for heart disease. Reviews the medication, class of the drug, and side effects. Includes the steps to properly store meds and maintain the prescription regimen.   Go Sex-Intimacy & Heart Disease, Get SMART - Goal Setting: - Group verbal and written instruction through game format to discuss heart disease and the return to sexual intimacy. Provides group verbal and written material to discuss and apply goal setting through the application of the S.M.A.R.T. Method.   Other Matters of the Heart: - Provides group verbal, written materials and models to describe Heart Failure, Angina, Valve Disease, and Diabetes in the realm of heart disease. Includes description of the disease process and treatment options available to the cardiac patient.   Exercise & Equipment Safety: - Individual verbal instruction and demonstration of equipment use and safety with use of the equipment.      Cardiac Rehab from 10/06/2014 in [facility]   Date  04/28/14   Educator  C. [place]kin,RN   Instruction Review Code  1- partially meets, needs review/practice      Infection Prevention: - Provides verbal and written material to  individual with discussion of infection control including proper hand washing and proper equipment cleaning during exercise session.      Cardiac Rehab from 10/06/2014 in [facility]   Date  04/28/14   Educator  C. [place]   Instruction Review Code  1- partially meets, needs review/practice      Falls Prevention: - Provides verbal and written material to individual with discussion of falls prevention and safety.      Cardiac Rehab from 10/06/2014 in [facility]   Date  04/28/14   Educator  C. Enterkin,RN   Instruction Review Code  1- partially meets, needs review/practice      Diabetes: - Individual verbal and written instruction to review signs/symptoms of diabetes, desired ranges of glucose level fasting, after meals and with exercise. Advice that pre and post exercise glucose checks will be done for 3 sessions at entry of program.      Cardiac Rehab from 10/06/2014 in [facility]   Date  04/28/14   Educator  C. [place]   Instruction Review Code  1- partially meets, needs review/practice       Knowledge Questionnaire Score:   Personal Goals and Risk Factors at Admission:   Personal Goals and Risk Factors Review:    Personal Goals Discharge (Final Personal Goals and Risk Factors Review):     Comments: Ramiel Forti attended his orientation/medical review on April 28, 2014 but even after multiple phone calls Jazmine didn't return to Cardiac Rehab until

## 2014-10-07 NOTE — Progress Notes (Signed)
Cardiac Individual Treatment Plan  Patient Details  Name: Lenoard Helbert. MRN: [id number] Date of Birth: [birth year] Referring Provider:  Corey Skains, MD  Initial Encounter Date: Date: 04/28/14  Visit Diagnosis: Chronic systolic heart failure  Patient's Home Medications on Admission:  Current outpatient prescriptions:  .  albuterol (PROVENTIL HFA;VENTOLIN HFA) 108 (90 BASE) MCG/ACT inhaler, Inhale 2 puffs into the lungs every 6 (six) hours as needed for wheezing or shortness of breath., Disp: , Rfl:  .  aspirin 81 MG tablet, Take 81 mg by mouth daily., Disp: , Rfl:  .  beclomethasone (QVAR) 40 MCG/ACT inhaler, Inhale 2 puffs into the lungs daily., Disp: , Rfl:  .  carvedilol (COREG) 6.25 MG tablet, Take 1 tablet (6.25 mg total) by mouth 2 (two) times daily with a meal., Disp: 60 tablet, Rfl: 5 .  isosorbide-hydrALAZINE (BIDIL) 20-37.5 MG per tablet, Take 1 tablet by mouth 3 (three) times daily., Disp: 90 tablet, Rfl: 3 .  potassium chloride (K-DUR,KLOR-CON) 10 MEQ tablet, Take 1 tablet (10 mEq total) by mouth 2 (two) times daily., Disp: 60 tablet, Rfl: 3 .  sacubitril-valsartan (ENTRESTO) 24-26 MG, Take 1 tablet by mouth 2 (two) times daily., Disp: , Rfl:  .  simvastatin (ZOCOR) 40 MG tablet, Take 1 tablet (40 mg total) by mouth daily., Disp: 30 tablet, Rfl: 3 .  torsemide (DEMADEX) 20 MG tablet, Take 2 tablets (40 mg total) by mouth daily. Resume torsemide 20 mg from wednesday July 27th thru sun july 31st and then take 40 mg daily thereafter, Disp: 60 tablet, Rfl: 3 .  lisinopril (PRINIVIL,ZESTRIL) 20 MG tablet, Take 1 tablet (20 mg total) by mouth daily. (Patient not taking: Reported on 10/06/2014), Disp: 90 tablet, Rfl: 3  Past Medical History: Past Medical History  Diagnosis Date  . Hypertension   . CHF (congestive heart failure)   . Diabetes mellitus without complication   . Obstructive sleep apnea   . Depression     Tobacco Use: History  Smoking status  . Former  Smoker  Smokeless tobacco  . Never Used    Labs: Recent [employer] for ITP Cardiac and Pulmonary Rehab Latest Ref Rng 02/08/2014 08/08/2014   Cholestrol 0-200 mg/dL 161 -   LDLCALC 0-100 mg/dL 112(H) -   HDL 40-60 mg/dL 33(L) -   Trlycerides 0-200 mg/dL 79 -   Hemoglobin A1c 4.0 - 6.0 % - 6.9(H)       Exercise Target Goals: Date: 04/28/14  Exercise Program Goal: Individual exercise prescription set with THRR, safety & activity barriers. Participant demonstrates ability to understand and report RPE using BORG scale, to self-measure pulse accurately, and to acknowledge the importance of the exercise prescription.  Exercise Prescription Goal: Starting with aerobic activity 30 plus minutes a day, 3 days per week for initial exercise prescription. Provide home exercise prescription and guidelines that participant acknowledges understanding prior to discharge.  Activity Barriers & Risk Stratification:   6 Minute Walk:     6 Minute Walk      04/28/14 1525       6 Minute Walk   Phase Initial     Distance 1510 feet  Initial results posted in [facility] prior to [facility]>     Walk Time 6 minutes     Resting HR 96 bpm     Resting BP 172/90 mmHg     Max Ex. HR 144 bpm     Max Ex. BP 282/102 mmHg  Gleb had  not taken his 3pm dose of BIDIL 37.73m-20mg which he takes TID 9am,3pm, and 9pm     RPE 13     Perceived Dyspnea  --  Pulse ox pre walk RA 94%     Symptoms No        Initial Exercise Prescription:     Initial Exercise Prescription - 10/07/14 1236    Date of Initial Exercise Prescription   Date 04/28/14   Treadmill   MPH 2   Grade 0   Minutes 15   NuStep   Level 3   Watts 40   Minutes 15   Prescription Details   Frequency (times per week) 3   Intensity   THRR REST +  20   Ratings of Perceived Exertion 11-13   Resistance Training   Training Prescription Yes   Weight 3   Reps 10-12      Exercise Prescription Changes:     Exercise  Prescription Changes      10/03/14 1800           Response to Exercise   Blood Pressure (Admit) 180/104 mmHg  Initial BP was 200/110, but was brought down with rest       Blood Pressure (Exercise) 196/94 mmHg  BP on TM was 240/110. Patient was moved to NS with no arms.       Blood Pressure (Exit) 196/106 mmHg          Discharge Exercise Prescription (Final Exercise Prescription Changes):     Exercise Prescription Changes - 10/03/14 1800    Response to Exercise   Blood Pressure (Admit) 180/104 mmHg  Initial BP was 200/110, but was brought down with rest   Blood Pressure (Exercise) 196/94 mmHg  BP on TM was 240/110. Patient was moved to NS with no arms.   Blood Pressure (Exit) 196/106 mmHg      Nutrition:  Target Goals: Understanding of nutrition guidelines, daily intake of sodium <15048m cholesterol <20093mcalories 30% from fat and 7% or less from saturated fats, daily to have 5 or more servings of fruits and vegetables.  Biometrics:     Pre Biometrics - 04/28/14 1221    Pre Biometrics   Waist Circumference 46 inches   Hip Circumference 49.5 inches   Waist to Hip Ratio 0.93 %       Nutrition Therapy Plan and Nutrition Goals:     Nutrition Therapy & Goals - 04/28/14 1220    Nutrition Therapy   Drug/Food Interactions Statins/Certain Fruits   Intervention Plan   Intervention Using nutrition plan and personal goals to gain a healthy nutrition lifestyle. Add exercise as prescribed.      Nutrition Discharge: Rate Your Plate Scores:   Nutrition Goals Re-Evaluation:   Psychosocial: Target Goals: Acknowledge presence or absence of depression, maximize coping skills, provide positive support system. Participant is able to verbalize types and ability to use techniques and skills needed for reducing stress and depression.  Initial Review & Psychosocial Screening:     Initial Psych Review & Screening - 04/28/14 1222    Initial Review   Current issues with  Current Stress Concerns   Family Dynamics   Good Support System? Yes   Screening Interventions   Interventions Encouraged to exercise      Quality of Life Scores:     Quality of Life - 04/28/14 1219    Quality of Life Scores   Health/Function Pre 13.9 %   Socioeconomic Pre 9.29 %   Psych/Spiritual Pre 17.21 %  Family Pre 23.6 %   GLOBAL Pre 15.06 %      PHQ-9:     Recent Review Flowsheet Data    Depression screen [facility] 2/9 09/12/2014 08/12/2014 08/05/2014 07/29/2014 06/16/2014   Decreased Interest 0 0 0 0 0   Down, Depressed, Hopeless 0 0 0 0 0   PHQ - 2 Score 0 0 0 0 0      Psychosocial Evaluation and Intervention:     Psychosocial Evaluation - 10/03/14 1737    Psychosocial Evaluation & Interventions   Interventions Encouraged to exercise with the program and follow exercise prescription   Comments Counselor met with Mr. Loh today for initial psychosocial evaluation.  He is a 49 year old who has been diagnosed with Congestive Heart Failure.  he also has other health issues including Asthma, Bronchitis and Sleep Apnea.  Mr. Allum reports he has a good support system with his significant other and a brother who lives close by.  He states he is not sleeping well and is scheduled for a follow-up sleep study soon to address this.  His appetite is good and he denies a history of depression or anxiety or current symptoms.  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  His goals are to lost weight, get stronger and decrease the number of medications he is taking while in this program.  Counselor will follow up with Mr. Darene Lamer re: his sleep study and recommendations.     Continued Psychosocial Services Needed Yes  Mr. Zeidman will benefit from the psychoeducational components of this program, especially stress management since he admits his job is very stressful.  He will also benefit from meeting with the dietician to  address his weight loss goals.       Psychosocial Re-Evaluation:   Vocational Rehabilitation: Provide vocational rehab assistance to qualifying candidates.   Vocational Rehab Evaluation & Intervention:   Education: Education Goals: Education classes will be provided on a weekly basis, covering required topics. Participant will state understanding/return demonstration of topics presented.  Learning Barriers/Preferences:     Learning Barriers/Preferences - 04/28/14 1219    Learning Barriers/Preferences   Learning Barriers None   Learning Preferences None      Education Topics: General Nutrition Guidelines/Fats and Fiber: -Group instruction provided by verbal, written material, models and posters to present the general guidelines for heart healthy nutrition. Gives an explanation and review of dietary fats and fiber.   Controlling Sodium/Reading Food Labels: -Group verbal and written material supporting the discussion of sodium use in heart healthy nutrition. Review and explanation with models, verbal and written materials for utilization of the food label.   Exercise Physiology & Risk Factors: - Group verbal and written instruction with models to review the exercise physiology of the cardiovascular system and associated critical values. Details cardiovascular disease risk factors and the goals associated with each risk factor.   Aerobic Exercise & Resistance Training: - Gives group verbal and written discussion on the health impact of inactivity. On the components of aerobic and resistive training programs and the benefits of this training and how to safely progress through these programs.   Flexibility, Balance, General Exercise Guidelines: - Provides group verbal and written instruction on the benefits of flexibility and balance training programs. Provides general exercise guidelines with specific guidelines to those with heart or lung disease. Demonstration and skill  practice provided.  Cardiac Rehab from 10/06/2014 in [facility] Cardiac Rehab   Date  10/03/14   Educator  SW   Instruction Review Code  2- meets goals/outcomes      Stress Management: - Provides group verbal and written instruction about the health risks of elevated stress, cause of high stress, and healthy ways to reduce stress.   Depression: - Provides group verbal and written instruction on the correlation between heart/lung disease and depressed mood, treatment options, and the stigmas associated with seeking treatment.   Anatomy & Physiology of the Heart: - Group verbal and written instruction and models provide basic cardiac anatomy and physiology, with the coronary electrical and arterial systems. Review of: AMI, Angina, Valve disease, Heart Failure, Cardiac Arrhythmia, Pacemakers, and the ICD.   Cardiac Procedures: - Group verbal and written instruction and models to describe the testing methods done to diagnose heart disease. Reviews the outcomes of the test results. Describes the treatment choices: Medical Management, Angioplasty, or Coronary Bypass Surgery.   Cardiac Medications: - Group verbal and written instruction to review commonly prescribed medications for heart disease. Reviews the medication, class of the drug, and side effects. Includes the steps to properly store meds and maintain the prescription regimen.   Go Sex-Intimacy & Heart Disease, Get SMART - Goal Setting: - Group verbal and written instruction through game format to discuss heart disease and the return to sexual intimacy. Provides group verbal and written material to discuss and apply goal setting through the application of the S.M.A.R.T. Method.   Other Matters of the Heart: - Provides group verbal, written materials and models to describe Heart Failure, Angina, Valve Disease, and Diabetes in the realm of heart disease. Includes description of the disease process and treatment options available  to the cardiac patient.   Exercise & Equipment Safety: - Individual verbal instruction and demonstration of equipment use and safety with use of the equipment.      Cardiac Rehab from 10/06/2014 in [facility] Cardiac Rehab   Date  04/28/14   Educator  C. Enterkin,RN   Instruction Review Code  1- partially meets, needs review/practice      Infection Prevention: - Provides verbal and written material to individual with discussion of infection control including proper hand washing and proper equipment cleaning during exercise session.      Cardiac Rehab from 10/06/2014 in [facility] Cardiac Rehab   Date  04/28/14   Educator  C. [place] Chapel   Instruction Review Code  1- partially meets, needs review/practice      Falls Prevention: - Provides verbal and written material to individual with discussion of falls prevention and safety.      Cardiac Rehab from 10/06/2014 in [facility] Cardiac Rehab   Date  04/28/14   Educator  C. Enterkin,RN   Instruction Review Code  1- partially meets, needs review/practice      Diabetes: - Individual verbal and written instruction to review signs/symptoms of diabetes, desired ranges of glucose level fasting, after meals and with exercise. Advice that pre and post exercise glucose checks will be done for 3 sessions at entry of program.      Cardiac Rehab from 10/06/2014 in [facility] Cardiac Rehab   Date  04/28/14   Educator  C. EnterkinRN   Instruction Review Code  1- partially meets, needs review/practice       Knowledge Questionnaire Score:   Personal Goals and Risk Factors at Admission:   Personal Goals and Risk Factors Review:    Personal Goals Discharge (Final Personal Goals  and Risk Factors Review):     Comments: [name] had initial medical review/orientation on 04/28/2014 then next time he attended [place] was 9/19/201 due to work conflicts. Heart Failure is his diagnosis.

## 2014-10-07 NOTE — Patient Instructions (Signed)
Patient Instructions  Patient Details  Name: Bradley Mckenzie. MRN: [id number] Date of Birth: 15-Jul-1965 Referring Provider:  Corey Skains, MD  Below are the personal goals you chose as well as exercise and nutrition goals. Our goal is to help you keep on track towards obtaining and maintaining your goals. We will be discussing your progress on these goals with you throughout the program.  Initial Exercise Prescription:     Initial Exercise Prescription - 10/07/14 1236    Date of Initial Exercise Prescription   Date 04/28/14   Treadmill   MPH 2   Grade 0   Minutes 15   NuStep   Level 3   Watts 40   Minutes 15   Prescription Details   Frequency (times per week) 3   Intensity   THRR REST +  20   Ratings of Perceived Exertion 11-13   Resistance Training   Training Prescription Yes   Weight 3   Reps 10-12      Exercise Goals: Frequency: Be able to perform aerobic exercise three times per week working toward 3-5 days per week.  Intensity: Work with a perceived exertion of 11 (fairly light) - 15 (hard) as tolerated. Follow your new exercise prescription and watch for changes in prescription as you progress with the program. Changes will be reviewed with you when they are made.  Duration: You should be able to do 30 minutes of continuous aerobic exercise in addition to a 5 minute warm-up and a 5 minute cool-down routine.  Nutrition Goals: Your personal nutrition goals will be established when you do your nutrition analysis with the dietician.  The following are nutrition guidelines to follow: Cholesterol < 200mg /day Sodium < 1500mg /day Fiber: Men under 50 yrs - 38 grams per day  Personal Goals:   Tobacco Use Initial Evaluation: History  Smoking status  . Former Smoker  Smokeless tobacco  . Never Used    Copy of goals given to participant.

## 2014-10-12 ENCOUNTER — Encounter: Payer: No Typology Code available for payment source | Admitting: *Deleted

## 2014-10-12 DIAGNOSIS — I5022 Chronic systolic (congestive) heart failure: Secondary | ICD-10-CM | POA: Diagnosis not present

## 2014-10-12 NOTE — Progress Notes (Signed)
Daily Session Note  Patient Details  Name: Kristi Hyer. MRN: [id number] Date of Birth: 1965/02/12 Referring Jori Thrall:  Corey Skains, MD  Encounter Date: 10/12/2014  Check In:     Session Check In - 10/12/14 1753    Check-In   Staff Present Candiss Norse MS, ACSM CEP Exercise Physiologist;Carroll Enterkin RN, BSN;Diane Joya Gaskins RN, BSN   ER physicians immediately available to respond to emergencies See telemetry face sheet for immediately available ER MD   Medication changes reported     No   Fall or balance concerns reported    No   Warm-up and Cool-down Performed on first and last piece of equipment   VAD Patient? No           Exercise Prescription Changes - 10/12/14 1700    Response to Exercise   Blood Pressure (Admit) --  Initial BP was 200/110, but was brought down with rest   Blood Pressure (Exercise) --  BP on TM was 240/110. Patient was moved to NS with no arms.   Resistance Training   Training Prescription Yes   Weight 4   NuStep   Level 3  T5 Nustep   Watts 25      Goals Met:  Proper associated with RPD/PD & O2 Sat Exercise tolerated well No report of cardiac concerns or symptoms  Goals Unmet:  Not Applicable  Goals Comments:Jeet is ready to work on his eating habits and to lose weight.    Dr. Emily Filbert is Medical Director for [facility].

## 2014-10-12 NOTE — Progress Notes (Signed)
Cardiac Individual Treatment Plan  Patient Details  Name: Bradley Mckenzie. MRN: [id number] Date of Birth: 1966/01/03 Referring Provider:  Corey Skains, MD  Initial Encounter Date:    Visit Diagnosis: Chronic systolic heart failure  Patient's Home Medications on Admission:  Current outpatient prescriptions:  .  albuterol (PROVENTIL HFA;VENTOLIN HFA) 108 (90 BASE) MCG/ACT inhaler, Inhale 2 puffs into the lungs every 6 (six) hours as needed for wheezing or shortness of breath., Disp: , Rfl:  .  aspirin 81 MG tablet, Take 81 mg by mouth daily., Disp: , Rfl:  .  beclomethasone (QVAR) 40 MCG/ACT inhaler, Inhale 2 puffs into the lungs daily., Disp: , Rfl:  .  carvedilol (COREG) 6.25 MG tablet, Take 1 tablet (6.25 mg total) by mouth 2 (two) times daily with a meal., Disp: 60 tablet, Rfl: 5 .  isosorbide-hydrALAZINE (BIDIL) 20-37.5 MG per tablet, Take 1 tablet by mouth 3 (three) times daily., Disp: 90 tablet, Rfl: 3 .  lisinopril (PRINIVIL,ZESTRIL) 20 MG tablet, Take 1 tablet (20 mg total) by mouth daily. (Patient not taking: Reported on 10/06/2014), Disp: 90 tablet, Rfl: 3 .  potassium chloride (K-DUR,KLOR-CON) 10 MEQ tablet, Take 1 tablet (10 mEq total) by mouth 2 (two) times daily., Disp: 60 tablet, Rfl: 3 .  sacubitril-valsartan (ENTRESTO) 24-26 MG, Take 1 tablet by mouth 2 (two) times daily., Disp: , Rfl:  .  simvastatin (ZOCOR) 40 MG tablet, Take 1 tablet (40 mg total) by mouth daily., Disp: 30 tablet, Rfl: 3 .  torsemide (DEMADEX) 20 MG tablet, Take 2 tablets (40 mg total) by mouth daily. Resume torsemide 20 mg from wednesday July 27th thru sun july 31st and then take 40 mg daily thereafter, Disp: 60 tablet, Rfl: 3  Past Medical History: Past Medical History  Diagnosis Date  . Hypertension   . CHF (congestive heart failure)   . Diabetes mellitus without complication   . Obstructive sleep apnea   . Depression     Tobacco Use: History  Smoking status  . Former Smoker   Smokeless tobacco  . Never Used    Labs: Recent [employer] for ITP Cardiac and Pulmonary Rehab Latest Ref Rng 02/08/2014 08/08/2014   Cholestrol 0-200 mg/dL 161 -   LDLCALC 0-100 mg/dL 112(H) -   HDL 40-60 mg/dL 33(L) -   Trlycerides 0-200 mg/dL 79 -   Hemoglobin A1c 4.0 - 6.0 % - 6.9(H)       Exercise Target Goals:    Exercise Program Goal: Individual exercise prescription set with THRR, safety & activity barriers. Participant demonstrates ability to understand and report RPE using BORG scale, to self-measure pulse accurately, and to acknowledge the importance of the exercise prescription.  Exercise Prescription Goal: Starting with aerobic activity 30 plus minutes a day, 3 days per week for initial exercise prescription. Provide home exercise prescription and guidelines that participant acknowledges understanding prior to discharge.  Activity Barriers & Risk Stratification:   6 Minute Walk:     6 Minute Walk      04/28/14 1525       6 Minute Walk   Phase Initial     Distance 1510 feet  Initial results posted in [facility] prior to EPIC>     Walk Time 6 minutes     Resting HR 96 bpm     Resting BP 172/90 mmHg     Max Ex. HR 144 bpm     Max Ex. BP 282/102 mmHg  Bradley Mckenzie had  not taken his 3pm dose of BIDIL 37.79m-20mg which he takes TID 9am,3pm, and 9pm     RPE 13     Perceived Dyspnea  --  Pulse ox pre walk RA 94%     Symptoms No        Initial Exercise Prescription:     Initial Exercise Prescription - 10/07/14 1236    Date of Initial Exercise Prescription   Date 04/28/14   Treadmill   MPH 2   Grade 0   Minutes 15   NuStep   Level 3   Watts 40   Minutes 15   Prescription Details   Frequency (times per week) 3   Intensity   THRR REST +  20   Ratings of Perceived Exertion 11-13   Resistance Training   Training Prescription Yes   Weight 3   Reps 10-12      Exercise Prescription Changes:     Exercise Prescription Changes       10/03/14 1800 10/12/14 1700         Response to Exercise   Blood Pressure (Admit) 180/104 mmHg  Initial BP was 200/110, but was brought down with rest --  Initial BP was 200/110, but was brought down with rest      Blood Pressure (Exercise) 196/94 mmHg  BP on TM was 240/110. Patient was moved to NS with no arms. --  BP on TM was 240/110. Patient was moved to NS with no arms.      Blood Pressure (Exit) 196/106 mmHg       Resistance Training   Training Prescription  Yes      Weight  4      NuStep   Level  3  T5 Nustep      Watts  25         Discharge Exercise Prescription (Final Exercise Prescription Changes):     Exercise Prescription Changes - 10/12/14 1700    Response to Exercise   Blood Pressure (Admit) --  Initial BP was 200/110, but was brought down with rest   Blood Pressure (Exercise) --  BP on TM was 240/110. Patient was moved to NS with no arms.   Resistance Training   Training Prescription Yes   Weight 4   NuStep   Level 3  T5 Nustep   Watts 25      Nutrition:  Target Goals: Understanding of nutrition guidelines, daily intake of sodium <15034m cholesterol <20063mcalories 30% from fat and 7% or less from saturated fats, daily to have 5 or more servings of fruits and vegetables.  Biometrics:     Pre Biometrics - 04/28/14 1221    Pre Biometrics   Waist Circumference 46 inches   Hip Circumference 49.5 inches   Waist to Hip Ratio 0.93 %       Nutrition Therapy Plan and Nutrition Goals:     Nutrition Therapy & Goals - 10/12/14 1757    Nutrition Therapy   Drug/Food Interactions Statins/Certain Fruits   Personal Nutrition Goals   Personal Goal #1 WAnts to know what to eat to lose weight.    Intervention Plan   Intervention Using nutrition plan and personal goals to gain a healthy nutrition lifestyle. Add exercise as prescribed.      Nutrition Discharge: Rate Your Plate Scores:   Nutrition Goals Re-Evaluation:   Psychosocial: Target  Goals: Acknowledge presence or absence of depression, maximize coping skills, provide positive support system. Participant is able to verbalize types and  ability to use techniques and skills needed for reducing stress and depression.  Initial Review & Psychosocial Screening:     Initial Psych Review & Screening - 10/12/14 1800    Initial Review   Current issues with Current Stress Concerns   Source of Stress Concerns Occupation   [place]? Yes   Barriers   Psychosocial barriers to participate in program The patient should benefit from training in stress management and relaxation.   Screening Interventions   Interventions Program counselor consult;Encouraged to exercise  Bradley Mckenzie is going to try to exercise here alot. 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       Quality of Life Scores:     Quality of Life - 04/28/14 1219    Quality of Life Scores   Health/Function Pre 13.9 %   Socioeconomic Pre 9.29 %   Psych/Spiritual Pre 17.21 %   Family Pre 23.6 %   GLOBAL Pre 15.06 %      PHQ-9:     Recent Review Flowsheet Data    Depression screen [facility] 2/9 10/12/2014 09/12/2014 08/12/2014 08/05/2014 07/29/2014   Decreased Interest 1 0 0 0 0   Down, Depressed, Hopeless 0 0 0 0 0   PHQ - 2 Score 1 0 0 0 0   Altered sleeping 1 - - - -   Tired, decreased energy 1 - - - -   Feeling bad or failure about yourself  0 - - - -   Trouble concentrating 0 - - - -   Moving slowly or fidgety/restless 0 - - - -   Suicidal thoughts 0 - - - -   PHQ-9 Score 3 - - - -      Psychosocial Evaluation and Intervention:     Psychosocial Evaluation - 10/03/14 1737    Psychosocial Evaluation & Interventions   Interventions Encouraged to exercise with the program and follow exercise prescription   Comments Counselor met with Bradley Mckenzie today for initial psychosocial evaluation.  He is a 49 year old  who has been diagnosed with Congestive Heart Failure.  he also has other health issues including Asthma, Bronchitis and Sleep Apnea.  Bradley Mckenzie reports he has a good support system with his significant other and a brother who lives close by.  He states he is not sleeping well and is scheduled for a follow-up sleep study soon to address this.  His appetite is good and he denies a history of depression or anxiety or current symptoms.  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  His goals are to lost weight, get stronger and decrease the number of medications he is taking while in this program.  Counselor will follow up with Bradley Mckenzie re: his sleep study and recommendations.     Continued Psychosocial Services Needed Yes  Bradley Mckenzie will benefit from the psychoeducational components of this program, especially stress management since he admits his Bradley is very stressful.  He will also benefit from meeting with the dietician to address his weight loss goals.       Psychosocial Re-Evaluation:   Vocational Rehabilitation: Provide vocational rehab assistance to qualifying candidates.   Vocational Rehab Evaluation & Intervention:     Vocational Rehab - 10/12/14 1755    Initial Vocational Rehab Evaluation &  Intervention   Assessment shows need for Vocational Rehabilitation No      Education: Education Goals: Education classes will be provided on a weekly basis, covering required topics. Participant will state understanding/return demonstration of topics presented.  Learning Barriers/Preferences:     Learning Barriers/Preferences - 10/12/14 1755    Learning Barriers/Preferences   Learning Barriers None   Learning Preferences None      Education Topics: General Nutrition Guidelines/Fats and Fiber: -Group instruction provided by verbal, written material, models and posters to present the general guidelines for heart  healthy nutrition. Gives an explanation and review of dietary fats and fiber.   Controlling Sodium/Reading Food Labels: -Group verbal and written material supporting the discussion of sodium use in heart healthy nutrition. Review and explanation with models, verbal and written materials for utilization of the food label.   Exercise Physiology & Risk Factors: - Group verbal and written instruction with models to review the exercise physiology of the cardiovascular system and associated critical values. Details cardiovascular disease risk factors and the goals associated with each risk factor.   Aerobic Exercise & Resistance Training: - Gives group verbal and written discussion on the health impact of inactivity. On the components of aerobic and resistive training programs and the benefits of this training and how to safely progress through these programs.   Flexibility, Balance, General Exercise Guidelines: - Provides group verbal and written instruction on the benefits of flexibility and balance training programs. Provides general exercise guidelines with specific guidelines to those with heart or lung disease. Demonstration and skill practice provided.          Cardiac Rehab from 10/06/2014 in [facility] Cardiac Rehab   Date  10/03/14   Educator  SW   Instruction Review Code  2- meets goals/outcomes      Stress Management: - Provides group verbal and written instruction about the health risks of elevated stress, cause of high stress, and healthy ways to reduce stress.   Depression: - Provides group verbal and written instruction on the correlation between heart/lung disease and depressed mood, treatment options, and the stigmas associated with seeking treatment.   Anatomy & Physiology of the Heart: - Group verbal and written instruction and models provide basic cardiac anatomy and physiology, with the coronary electrical and arterial systems. Review of: AMI, Angina, Valve disease, Heart  Failure, Cardiac Arrhythmia, Pacemakers, and the ICD.   Cardiac Procedures: - Group verbal and written instruction and models to describe the testing methods done to diagnose heart disease. Reviews the outcomes of the test results. Describes the treatment choices: Medical Management, Angioplasty, or Coronary Bypass Surgery.   Cardiac Medications: - Group verbal and written instruction to review commonly prescribed medications for heart disease. Reviews the medication, class of the drug, and side effects. Includes the steps to properly store meds and maintain the prescription regimen.   Go Sex-Intimacy & Heart Disease, Get SMART - Goal Setting: - Group verbal and written instruction through game format to discuss heart disease and the return to sexual intimacy. Provides group verbal and written material to discuss and apply goal setting through the application of the S.M.A.R.T. Method.   Other Matters of the Heart: - Provides group verbal, written materials and models to describe Heart Failure, Angina, Valve Disease, and Diabetes in the realm of heart disease. Includes description of the disease process and treatment options available to the cardiac patient.   Exercise & Equipment Safety: - Individual verbal instruction and demonstration of equipment use and safety with  use of the equipment.      Cardiac Rehab from 10/12/2014 in [facility] Cardiac Rehab   Date  10/12/14   Educator  C. Cheryln Balcom,RN   Instruction Review Code  1- partially meets, needs review/practice      Infection Prevention: - Provides verbal and written material to individual with discussion of infection control including proper hand washing and proper equipment cleaning during exercise session.      Cardiac Rehab from 10/06/2014 in [facility] Cardiac Rehab   Date  04/28/14   Educator  C. [place]   Instruction Review Code  1- partially meets, needs review/practice      Falls Prevention: - Provides verbal and written  material to individual with discussion of falls prevention and safety.      Cardiac Rehab from 10/06/2014 in [facility] Cardiac Rehab   Date  04/28/14   Educator  C. Garielle Mroz,RN   Instruction Review Code  1- partially meets, needs review/practice      Diabetes: - Individual verbal and written instruction to review signs/symptoms of diabetes, desired ranges of glucose level fasting, after meals and with exercise. Advice that pre and post exercise glucose checks will be done for 3 sessions at entry of program.      Cardiac Rehab from 10/06/2014 in [facility] Cardiac Rehab   Date  04/28/14   Educator  C. [place]   Instruction Review Code  1- partially meets, needs review/practice       Knowledge Questionnaire Score:   Personal Goals and Risk Factors at Admission:     Personal Goals and Risk Factors at Admission - 10/07/14 1252    Personal Goals and Risk Factors on Admission    Weight Management Yes   Intervention Learn and follow the exercise and diet guidelines while in the program. Utilize the nutrition and education classes to help gain knowledge of the diet and exercise expectations in the program   Increase Aerobic Exercise and Physical Activity Yes   Intervention While in program, learn and follow the exercise prescription taught. Start at a low level workload and increase workload after able to maintain previous level for 30 minutes. Increase time before increasing intensity.   Diabetes Yes   Goal Blood glucose control identified by blood glucose values, HgbA1C. Participant verbalizes understanding of the signs/symptoms of hyper/hypo glycemia, proper foot care and importance of medication and nutrition plan for blood glucose control.   Intervention Provide nutrition & aerobic exercise along with prescribed medications to achieve blood glucose in normal ranges: Fasting 65-99 mg/dL   Hypertension Yes   Goal Participant will see blood pressure controlled within the values of 140/48mm/Hg or  within value directed by their physician.   Intervention Provide nutrition & aerobic exercise along with prescribed medications to achieve BP 140/90 or less.   Lipids Yes   Goal Cholesterol controlled with medications as prescribed, with individualized exercise RX and with personalized nutrition plan. Value goals: LDL < $Rem'70mg'iKjk$ , HDL > $Rem'40mg'XrkL$ . Participant states understanding of desired cholesterol values and following prescriptions.   Intervention Provide nutrition & aerobic exercise along with prescribed medications to achieve LDL '70mg'$ , HDL >$Remo'40mg'AYDrw$ .   Stress Yes   Goal To meet with psychosocial counselor for stress and relaxation information and guidance. To state understanding of performing relaxation techniques and or identifying personal stressors.   Intervention Provide education on types of stress, identifiying stressors, and ways to cope with stress. Provide demonstration and active practice of relaxation techniques.      Personal Goals and Risk Factors  Review:      Goals and Risk Factor Review      10/12/14 1758           Weight Management   Goals Progress/Improvement seen No       Comments Tyrease has gained 5 lbs since he was here in April. Appt made with dietician for tomorrow. 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        Hypertension   Goal Participant will see blood pressure controlled within the values of 140/81m/Hg or within value directed by their physician.  Einar's blood pressure is much better controlled than in April. Today 150/85.       Stress   Goal To meet with psychosocial counselor for stress and relaxation information and guidance. To state understanding of performing relaxation techniques and or identifying personal stressors.  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           Personal Goals Discharge (Final Personal Goals and Risk Factors Review):       Goals and Risk Factor Review - 10/12/14 1758    Weight Management   Goals Progress/Improvement seen No   Comments TSevernhas gained 5 lbs since he was here in April. Appt made with dietician for tomorrow. 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    Hypertension   Goal Participant will see blood pressure controlled within the values of 140/969mHg or within value directed by their physician.  Jatavious's blood pressure is much better controlled than in April. Today 150/85.   Stress   Goal To meet with psychosocial counselor for stress and relaxation information and guidance. To state understanding of performing relaxation techniques and or identifying personal stressors.  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        Comments: TrArdelle Antonas an appt with Registered Dietician tomorrow and his goal is to eat healthier and to lose weight.

## 2014-10-13 ENCOUNTER — Encounter: Payer: No Typology Code available for payment source | Admitting: *Deleted

## 2014-10-13 DIAGNOSIS — I5022 Chronic systolic (congestive) heart failure: Secondary | ICD-10-CM

## 2014-10-13 LAB — GLUCOSE, CAPILLARY: Glucose-Capillary: 104 mg/dL — ABNORMAL HIGH (ref 65–99)

## 2014-10-13 NOTE — Progress Notes (Signed)
Daily Session Note  Patient Details  Name: Bradley Mckenzie. MRN: [id number] Date of Birth: 27-Nov-1965 Referring Provider:  Corey Skains, MD  Encounter Date: 10/13/2014  Check In:     Session Check In - 10/13/14 1634    Check-In   Staff Present Gerlene Burdock RN, BSN;Diane Joya Gaskins RN, BSN;Other   ER physicians immediately available to respond to emergencies See telemetry face sheet for immediately available ER MD   Medication changes reported     No   Fall or balance concerns reported    No   Warm-up and Cool-down Performed on first and last piece of equipment   VAD Patient? No   Pain Assessment   Currently in Pain? No/denies         POCT Glucose - 10/13/14 1652    POCT Blood Glucose   Pre-Exercise 104 mg/dL          Exercise Prescription Changes - 10/12/14 1700    Response to Exercise   Blood Pressure (Admit) --  Initial BP was 200/110, but was brought down with rest   Blood Pressure (Exercise) --  BP on TM was 240/110. Patient was moved to NS with no arms.   Resistance Training   Training Prescription Yes   Weight 4   NuStep   Level 3  T5 Nustep   Watts 25      Goals Met:  Proper associated with RPD/PD & O2 Sat Exercise tolerated well  Goals Unmet:  Not Applicable  Goals Comments: Doing well today.    Dr. Emily Filbert is Medical Director for [facility].

## 2014-10-13 NOTE — Progress Notes (Signed)
Cardiac Individual Treatment Plan  Patient Details  Name: Bradley Mckenzie. MRN: [id number] Date of Birth: 02-26-1965 Referring Provider:  Corey Skains, MD  Initial Encounter Date:    Visit Diagnosis: Chronic systolic heart failure  Patient's Home Medications on Admission:  Current outpatient prescriptions:  .  albuterol (PROVENTIL HFA;VENTOLIN HFA) 108 (90 BASE) MCG/ACT inhaler, Inhale 2 puffs into the lungs every 6 (six) hours as needed for wheezing or shortness of breath., Disp: , Rfl:  .  aspirin 81 MG tablet, Take 81 mg by mouth daily., Disp: , Rfl:  .  beclomethasone (QVAR) 40 MCG/ACT inhaler, Inhale 2 puffs into the lungs daily., Disp: , Rfl:  .  carvedilol (COREG) 6.25 MG tablet, Take 1 tablet (6.25 mg total) by mouth 2 (two) times daily with a meal., Disp: 60 tablet, Rfl: 5 .  isosorbide-hydrALAZINE (BIDIL) 20-37.5 MG per tablet, Take 1 tablet by mouth 3 (three) times daily., Disp: 90 tablet, Rfl: 3 .  lisinopril (PRINIVIL,ZESTRIL) 20 MG tablet, Take 1 tablet (20 mg total) by mouth daily. (Patient not taking: Reported on 10/06/2014), Disp: 90 tablet, Rfl: 3 .  potassium chloride (K-DUR,KLOR-CON) 10 MEQ tablet, Take 1 tablet (10 mEq total) by mouth 2 (two) times daily., Disp: 60 tablet, Rfl: 3 .  sacubitril-valsartan (ENTRESTO) 24-26 MG, Take 1 tablet by mouth 2 (two) times daily., Disp: , Rfl:  .  simvastatin (ZOCOR) 40 MG tablet, Take 1 tablet (40 mg total) by mouth daily., Disp: 30 tablet, Rfl: 3 .  torsemide (DEMADEX) 20 MG tablet, Take 2 tablets (40 mg total) by mouth daily. Resume torsemide 20 mg from wednesday July 27th thru sun july 31st and then take 40 mg daily thereafter, Disp: 60 tablet, Rfl: 3  Past Medical History: Past Medical History  Diagnosis Date  . Hypertension   . CHF (congestive heart failure)   . Diabetes mellitus without complication   . Obstructive sleep apnea   . Depression     Tobacco Use: History  Smoking status  . Former Smoker   Smokeless tobacco  . Never Used    Labs: Recent [employer] for ITP Cardiac and Pulmonary Rehab Latest Ref Rng 02/08/2014 08/08/2014   Cholestrol 0-200 mg/dL 161 -   LDLCALC 0-100 mg/dL 112(H) -   HDL 40-60 mg/dL 33(L) -   Trlycerides 0-200 mg/dL 79 -   Hemoglobin A1c 4.0 - 6.0 % - 6.9(H)         POCT Glucose      10/13/14 1652           POCT Blood Glucose   Pre-Exercise 104 mg/dL          Exercise Target Goals:    Exercise Program Goal: Individual exercise prescription set with THRR, safety & activity barriers. Participant demonstrates ability to understand and report RPE using BORG scale, to self-measure pulse accurately, and to acknowledge the importance of the exercise prescription.  Exercise Prescription Goal: Starting with aerobic activity 30 plus minutes a day, 3 days per week for initial exercise prescription. Provide home exercise prescription and guidelines that participant acknowledges understanding prior to discharge.  Activity Barriers & Risk Stratification:   6 Minute Walk:     6 Minute Walk      04/28/14 1525       6 Minute Walk   Phase Initial     Distance 1510 feet  Initial results posted in [facility] prior to EPIC>     Walk Time 6  minutes     Resting HR 96 bpm     Resting BP 172/90 mmHg     Max Ex. HR 144 bpm     Max Ex. BP 282/102 mmHg  Tijuan had not taken his 3pm dose of BIDIL 37.$RemoveBef'5mg'ZNHxbsbzdW$ -$Remove'20mg'IzJqGte$  which he takes TID 9am,3pm, and 9pm     RPE 13     Perceived Dyspnea  --  Pulse ox pre walk RA 94%     Symptoms No        Initial Exercise Prescription:     Initial Exercise Prescription - 10/07/14 1236    Date of Initial Exercise Prescription   Date 04/28/14   Treadmill   MPH 2   Grade 0   Minutes 15   NuStep   Level 3   Watts 40   Minutes 15   Prescription Details   Frequency (times per week) 3   Intensity   THRR REST +  20   Ratings of Perceived Exertion 11-13   Resistance Training   Training Prescription  Yes   Weight 3   Reps 10-12      Exercise Prescription Changes:     Exercise Prescription Changes      10/03/14 1800 10/12/14 1700         Response to Exercise   Blood Pressure (Admit) 180/104 mmHg  Initial BP was 200/110, but was brought down with rest --  Initial BP was 200/110, but was brought down with rest      Blood Pressure (Exercise) 196/94 mmHg  BP on TM was 240/110. Patient was moved to NS with no arms. --  BP on TM was 240/110. Patient was moved to NS with no arms.      Blood Pressure (Exit) 196/106 mmHg       Resistance Training   Training Prescription  Yes      Weight  4      NuStep   Level  3  T5 Nustep      Watts  25         Discharge Exercise Prescription (Final Exercise Prescription Changes):     Exercise Prescription Changes - 10/12/14 1700    Response to Exercise   Blood Pressure (Admit) --  Initial BP was 200/110, but was brought down with rest   Blood Pressure (Exercise) --  BP on TM was 240/110. Patient was moved to NS with no arms.   Resistance Training   Training Prescription Yes   Weight 4   NuStep   Level 3  T5 Nustep   Watts 25      Nutrition:  Target Goals: Understanding of nutrition guidelines, daily intake of sodium '1500mg'$ , cholesterol '200mg'$ , calories 30% from fat and 7% or less from saturated fats, daily to have 5 or more servings of fruits and vegetables.  Biometrics:     Pre Biometrics - 04/28/14 1221    Pre Biometrics   Waist Circumference 46 inches   Hip Circumference 49.5 inches   Waist to Hip Ratio 0.93 %       Nutrition Therapy Plan and Nutrition Goals:     Nutrition Therapy & Goals - 10/13/14 1634    Nutrition Therapy   Diet 1800 kcal DASH   Drug/Food Interactions Statins/Certain Fruits   Fiber 30 grams   Whole Grain Foods 3 servings   Protein 8 ounces/day   Saturated Fats 14 max. grams   Fruits and Vegetables 5 servings/day   Personal Nutrition Goals   Personal Goal #1  Make step-by-step changes to  increase fruits, vegetables, and whole grain foods   Personal Goal #2 Work to limit processed meats and sweets (small portions, lower fat or sugar varieties)      Nutrition Discharge: Rate Your Plate Scores:   Nutrition Goals Re-Evaluation:   Psychosocial: Target Goals: Acknowledge presence or absence of depression, maximize coping skills, provide positive support system. Participant is able to verbalize types and ability to use techniques and skills needed for reducing stress and depression.  Initial Review & Psychosocial Screening:     Initial Psych Review & Screening - 10/12/14 1800    Initial Review   Current issues with Current Stress Concerns   Source of Stress Concerns Occupation   [place]? Yes   Barriers   Psychosocial barriers to participate in program The patient should benefit from training in stress management and relaxation.   Screening Interventions   Interventions Program counselor consult;Encouraged to exercise  [name] is going to try to exercise here alot. 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       Quality of Life Scores:     Quality of Life - 04/28/14 1219    Quality of Life Scores   Health/Function Pre 13.9 %   Socioeconomic Pre 9.29 %   Psych/Spiritual Pre 17.21 %   Family Pre 23.6 %   GLOBAL Pre 15.06 %      PHQ-9:     Recent Review Flowsheet Data    Depression screen [facility] 2/9 10/12/2014 09/12/2014 08/12/2014 08/05/2014 07/29/2014   Decreased Interest 1 0 0 0 0   Down, Depressed, Hopeless 0 0 0 0 0   PHQ - 2 Score 1 0 0 0 0   Altered sleeping 1 - - - -   Tired, decreased energy 1 - - - -   Feeling bad or failure about yourself  0 - - - -   Trouble concentrating 0 - - - -   Moving slowly or fidgety/restless 0 - - - -   Suicidal thoughts 0 - - - -   PHQ-9 Score 3 - - - -      Psychosocial Evaluation and Intervention:      Psychosocial Evaluation - 10/03/14 1737    Psychosocial Evaluation & Interventions   Interventions Encouraged to exercise with the program and follow exercise prescription   Comments Counselor met with Mr. Dehoyos today for initial psychosocial evaluation.  He is a 49 year old who has been diagnosed with Congestive Heart Failure.  he also has other health issues including Asthma, Bronchitis and Sleep Apnea.  Mr. Rands reports he has a good support system with his significant other and a brother who lives close by.  He states he is not sleeping well and is scheduled for a follow-up sleep study soon to address this.  His appetite is good and he denies a history of depression or anxiety or current symptoms.  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  His goals are to lost weight, get stronger and decrease the number of medications he is taking while in this program.  Counselor will follow up with Mr. Darene Lamer re: his sleep study and recommendations.     Continued Psychosocial Services Needed Yes  Mr. Newbury will benefit from the psychoeducational components of  this program, especially stress management since he admits his job is very stressful.  He will also benefit from meeting with the dietician to address his weight loss goals.       Psychosocial Re-Evaluation:   Vocational Rehabilitation: Provide vocational rehab assistance to qualifying candidates.   Vocational Rehab Evaluation & Intervention:     Vocational Rehab - 10/12/14 1755    Initial Vocational Rehab Evaluation & Intervention   Assessment shows need for Vocational Rehabilitation No      Education: Education Goals: Education classes will be provided on a weekly basis, covering required topics. Participant will state understanding/return demonstration of topics presented.  Learning Barriers/Preferences:     Learning Barriers/Preferences - 10/12/14 1755     Learning Barriers/Preferences   Learning Barriers None   Learning Preferences None      Education Topics: General Nutrition Guidelines/Fats and Fiber: -Group instruction provided by verbal, written material, models and posters to present the general guidelines for heart healthy nutrition. Gives an explanation and review of dietary fats and fiber.   Controlling Sodium/Reading Food Labels: -Group verbal and written material supporting the discussion of sodium use in heart healthy nutrition. Review and explanation with models, verbal and written materials for utilization of the food label.   Exercise Physiology & Risk Factors: - Group verbal and written instruction with models to review the exercise physiology of the cardiovascular system and associated critical values. Details cardiovascular disease risk factors and the goals associated with each risk factor.   Aerobic Exercise & Resistance Training: - Gives group verbal and written discussion on the health impact of inactivity. On the components of aerobic and resistive training programs and the benefits of this training and how to safely progress through these programs.   Flexibility, Balance, General Exercise Guidelines: - Provides group verbal and written instruction on the benefits of flexibility and balance training programs. Provides general exercise guidelines with specific guidelines to those with heart or lung disease. Demonstration and skill practice provided.          Cardiac Rehab from 10/06/2014 in [facility] Cardiac Rehab   Date  10/03/14   Educator  SW   Instruction Review Code  2- meets goals/outcomes      Stress Management: - Provides group verbal and written instruction about the health risks of elevated stress, cause of high stress, and healthy ways to reduce stress.   Depression: - Provides group verbal and written instruction on the correlation between heart/lung disease and depressed mood, treatment options, and  the stigmas associated with seeking treatment.   Anatomy & Physiology of the Heart: - Group verbal and written instruction and models provide basic cardiac anatomy and physiology, with the coronary electrical and arterial systems. Review of: AMI, Angina, Valve disease, Heart Failure, Cardiac Arrhythmia, Pacemakers, and the ICD.   Cardiac Procedures: - Group verbal and written instruction and models to describe the testing methods done to diagnose heart disease. Reviews the outcomes of the test results. Describes the treatment choices: Medical Management, Angioplasty, or Coronary Bypass Surgery.   Cardiac Medications: - Group verbal and written instruction to review commonly prescribed medications for heart disease. Reviews the medication, class of the drug, and side effects. Includes the steps to properly store meds and maintain the prescription regimen.   Go Sex-Intimacy & Heart Disease, Get SMART - Goal Setting: - Group verbal and written instruction through game format to discuss heart disease and the return to sexual intimacy. Provides group verbal and written material to discuss and  apply goal setting through the application of the S.M.A.R.T. Method.   Other Matters of the Heart: - Provides group verbal, written materials and models to describe Heart Failure, Angina, Valve Disease, and Diabetes in the realm of heart disease. Includes description of the disease process and treatment options available to the cardiac patient.   Exercise & Equipment Safety: - Individual verbal instruction and demonstration of equipment use and safety with use of the equipment.      Cardiac Rehab from 10/12/2014 in [facility] Cardiac Rehab   Date  10/12/14   Educator  C. [place],RN   Instruction Review Code  1- partially meets, needs review/practice      Infection Prevention: - Provides verbal and written material to individual with discussion of infection control including proper hand washing and proper  equipment cleaning during exercise session.      Cardiac Rehab from 10/06/2014 in [facility] Cardiac Rehab   Date  04/28/14   Educator  C. [place]   Instruction Review Code  1- partially meets, needs review/practice      Falls Prevention: - Provides verbal and written material to individual with discussion of falls prevention and safety.      Cardiac Rehab from 10/06/2014 in [facility] Cardiac Rehab   Date  04/28/14   Educator  C. [place],RN   Instruction Review Code  1- partially meets, needs review/practice      Diabetes: - Individual verbal and written instruction to review signs/symptoms of diabetes, desired ranges of glucose level fasting, after meals and with exercise. Advice that pre and post exercise glucose checks will be done for 3 sessions at entry of program.      Cardiac Rehab from 10/06/2014 in [facility] Cardiac Rehab   Date  04/28/14   Educator  C. [place]   Instruction Review Code  1- partially meets, needs review/practice       Knowledge Questionnaire Score:   Personal Goals and Risk Factors at Admission:     Personal Goals and Risk Factors at Admission - 10/07/14 1252    Personal Goals and Risk Factors on Admission    Weight Management Yes   Intervention Learn and follow the exercise and diet guidelines while in the program. Utilize the nutrition and education classes to help gain knowledge of the diet and exercise expectations in the program   Increase Aerobic Exercise and Physical Activity Yes   Intervention While in program, learn and follow the exercise prescription taught. Start at a low level workload and increase workload after able to maintain previous level for 30 minutes. Increase time before increasing intensity.   Diabetes Yes   Goal Blood glucose control identified by blood glucose values, HgbA1C. Participant verbalizes understanding of the signs/symptoms of hyper/hypo glycemia, proper foot care and importance of medication and nutrition plan for blood  glucose control.   Intervention Provide nutrition & aerobic exercise along with prescribed medications to achieve blood glucose in normal ranges: Fasting 65-99 mg/dL   Hypertension Yes   Goal Participant will see blood pressure controlled within the values of 140/57m/Hg or within value directed by their physician.   Intervention Provide nutrition & aerobic exercise along with prescribed medications to achieve BP 140/90 or less.   Lipids Yes   Goal Cholesterol controlled with medications as prescribed, with individualized exercise RX and with personalized nutrition plan. Value goals: LDL < 713m HDL > 4050mParticipant states understanding of desired cholesterol values and following prescriptions.   Intervention Provide nutrition & aerobic exercise along with prescribed medications  to achieve LDL <67m, HDL >460m   Stress Yes   Goal To meet with psychosocial counselor for stress and relaxation information and guidance. To state understanding of performing relaxation techniques and or identifying personal stressors.   Intervention Provide education on types of stress, identifiying stressors, and ways to cope with stress. Provide demonstration and active practice of relaxation techniques.      Personal Goals and Risk Factors Review:      Goals and Risk Factor Review      10/12/14 1758 10/13/14 1635 10/13/14 1652       Weight Management   Goals Progress/Improvement seen No       Comments Howie has gained 5 lbs since he was here in April. Appt made with dietician for tomorrow. 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        Increase Aerobic Exercise and Physical Activity   Goals Progress/Improvement seen   (p) Yes Yes     Comments   Worked out on Treadmill today. Raney's goal weight is 200lbs     Diabetes   Goal   --  Diet controlled diabetes and currently eats regular ice cream but met with dieticiian today. Hard to eat  at times since he isn't cooking and works in a Group home.      Hypertension   Goal Participant will see blood pressure controlled within the values of 140/901mg or within value directed by their physician.  Joseph's blood pressure is much better controlled than in April. Today 150/85.       Stress   Goal To meet with psychosocial counselor for stress and relaxation information and guidance. To state understanding of performing relaxation techniques and or identifying personal stressors.  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   --  Stress at work at times. Is trying to attend Cardiac rehab as much as possible.        Personal Goals Discharge (Final Personal Goals and Risk Factors Review):      Goals and Risk Factor Review - 10/13/14 1652    Increase Aerobic Exercise and Physical Activity   Goals Progress/Improvement seen  Yes   Comments Worked out on Treadmill today. Urban's goal weight is 200lbs   Diabetes   Goal --  Diet controlled diabetes and currently eats regular ice cream but met with dieticiian today. 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    Stress   Goal --  Stress at work at times. Is trying to attend Cardiac rehab as much as possible.       Comments: Fingerstick blood sugar done and diabetes info given. Leovardo only ate oatmeal today since his lunch didn't smell well so given crackers.

## 2014-10-14 ENCOUNTER — Ambulatory Visit: Payer: No Typology Code available for payment source | Attending: Specialist

## 2014-10-14 DIAGNOSIS — G4733 Obstructive sleep apnea (adult) (pediatric): Secondary | ICD-10-CM | POA: Diagnosis present

## 2014-10-17 ENCOUNTER — Encounter: Payer: No Typology Code available for payment source | Attending: Internal Medicine

## 2014-10-17 DIAGNOSIS — I5022 Chronic systolic (congestive) heart failure: Secondary | ICD-10-CM | POA: Insufficient documentation

## 2014-10-19 ENCOUNTER — Encounter: Payer: Self-pay | Admitting: *Deleted

## 2014-10-19 DIAGNOSIS — I5022 Chronic systolic (congestive) heart failure: Secondary | ICD-10-CM

## 2014-10-19 NOTE — Progress Notes (Signed)
Cardiac Individual Treatment Plan  Patient Details  Name: Bradley Mckenzie. MRN: [id number] Date of Birth: 1965/04/13 Referring Provider:  No ref. provider found  Initial Encounter Date:    Visit Diagnosis: Chronic systolic heart failure ([place])  Patient's Home Medications on Admission:  Current outpatient prescriptions:  .  albuterol (PROVENTIL HFA;VENTOLIN HFA) 108 (90 BASE) MCG/ACT inhaler, Inhale 2 puffs into the lungs every 6 (six) hours as needed for wheezing or shortness of breath., Disp: , Rfl:  .  aspirin 81 MG tablet, Take 81 mg by mouth daily., Disp: , Rfl:  .  beclomethasone (QVAR) 40 MCG/ACT inhaler, Inhale 2 puffs into the lungs daily., Disp: , Rfl:  .  carvedilol (COREG) 6.25 MG tablet, Take 1 tablet (6.25 mg total) by mouth 2 (two) times daily with a meal., Disp: 60 tablet, Rfl: 5 .  isosorbide-hydrALAZINE (BIDIL) 20-37.5 MG per tablet, Take 1 tablet by mouth 3 (three) times daily., Disp: 90 tablet, Rfl: 3 .  lisinopril (PRINIVIL,ZESTRIL) 20 MG tablet, Take 1 tablet (20 mg total) by mouth daily. (Patient not taking: Reported on 10/06/2014), Disp: 90 tablet, Rfl: 3 .  potassium chloride (K-DUR,KLOR-CON) 10 MEQ tablet, Take 1 tablet (10 mEq total) by mouth 2 (two) times daily., Disp: 60 tablet, Rfl: 3 .  sacubitril-valsartan (ENTRESTO) 24-26 MG, Take 1 tablet by mouth 2 (two) times daily., Disp: , Rfl:  .  simvastatin (ZOCOR) 40 MG tablet, Take 1 tablet (40 mg total) by mouth daily., Disp: 30 tablet, Rfl: 3 .  torsemide (DEMADEX) 20 MG tablet, Take 2 tablets (40 mg total) by mouth daily. Resume torsemide 20 mg from wednesday July 27th thru sun july 31st and then take 40 mg daily thereafter, Disp: 60 tablet, Rfl: 3  Past Medical History: Past Medical History  Diagnosis Date  . Hypertension   . CHF (congestive heart failure)   . Diabetes mellitus without complication   . Obstructive sleep apnea   . Depression     Tobacco Use: History  Smoking status  . Former Smoker   Smokeless tobacco  . Never Used    Labs: Recent [employer] for ITP Cardiac and Pulmonary Rehab Latest Ref Rng 02/08/2014 08/08/2014   Cholestrol 0-200 mg/dL 161 -   LDLCALC 0-100 mg/dL 112(H) -   HDL 40-60 mg/dL 33(L) -   Trlycerides 0-200 mg/dL 79 -   Hemoglobin A1c 4.0 - 6.0 % - 6.9(H)         POCT Glucose      10/13/14 1652           POCT Blood Glucose   Pre-Exercise 104 mg/dL          Exercise Target Goals:    Exercise Program Goal: Individual exercise prescription set with THRR, safety & activity barriers. Participant demonstrates ability to understand and report RPE using BORG scale, to self-measure pulse accurately, and to acknowledge the importance of the exercise prescription.  Exercise Prescription Goal: Starting with aerobic activity 30 plus minutes a day, 3 days per week for initial exercise prescription. Provide home exercise prescription and guidelines that participant acknowledges understanding prior to discharge.  Activity Barriers & Risk Stratification:   6 Minute Walk:     6 Minute Walk      04/28/14 1525       6 Minute Walk   Phase Initial     Distance 1510 feet  Initial results posted in [facility] prior to [facility]     Walk Time  6 minutes     Resting HR 96 bpm     Resting BP 172/90 mmHg     Max Ex. HR 144 bpm     Max Ex. BP 282/102 mmHg  Lj had not taken his 3pm dose of BIDIL 37.61m-20mg which he takes TID 9am,3pm, and 9pm     RPE 13     Perceived Dyspnea  --  Pulse ox pre walk RA 94%     Symptoms No        Initial Exercise Prescription:     Initial Exercise Prescription - 10/07/14 1236    Date of Initial Exercise Prescription   Date 04/28/14   Treadmill   MPH 2   Grade 0   Minutes 15   NuStep   Level 3   Watts 40   Minutes 15   Prescription Details   Frequency (times per week) 3   Intensity   THRR REST +  20   Ratings of Perceived Exertion 11-13   Resistance Training   Training Prescription  Yes   Weight 3   Reps 10-12      Exercise Prescription Changes:     Exercise Prescription Changes      10/03/14 1800 10/12/14 1700 10/19/14 0800       Exercise Review   Progression   Yes     Response to Exercise   Blood Pressure (Admit) 180/104 mmHg  Initial BP was 200/110, but was brought down with rest --  Initial BP was 200/110, but was brought down with rest 162/88 mmHg     Blood Pressure (Exercise) 196/94 mmHg  BP on TM was 240/110. Patient was moved to NS with no arms. --  BP on TM was 240/110. Patient was moved to NS with no arms. 132/80 mmHg     Blood Pressure (Exit) 196/106 mmHg  158/98 mmHg     Heart Rate (Admit)   108 bpm     Heart Rate (Exercise)   124 bpm     Heart Rate (Exit)   104 bpm     Rating of Perceived Exertion (Exercise)   12     Symptoms   High blood pressure upon arrival; back pain when walking on the treadmill     Comments   Talked with Dreon about back pain on the treadmill. Slower speeds seem to help him, he said the pain varies by the day. Each class we will check in with him on how his back feels and proceed accordingly with exercise mode.     Duration   Progress to 30 minutes of continuous aerobic without signs/symptoms of physical distress     Intensity   Other (comment)  REST + 20     Progression   Continue progressive overload as per policy without signs/symptoms or physical distress.     Resistance Training   Training Prescription  Yes Yes     Weight  4 8     Reps   10-15     Interval Training   Interval Training   No     Treadmill   MPH   2.2     Grade   0     Minutes   15     NuStep   Level  3  T5 Nustep 5  T5 NS     Watts  25 50     Minutes   20        Discharge Exercise Prescription (Final Exercise Prescription Changes):  Exercise Prescription Changes - 10/19/14 0800    Exercise Review   Progression Yes   Response to Exercise   Blood Pressure (Admit) 162/88 mmHg   Blood Pressure (Exercise) 132/80 mmHg   Blood  Pressure (Exit) 158/98 mmHg   Heart Rate (Admit) 108 bpm   Heart Rate (Exercise) 124 bpm   Heart Rate (Exit) 104 bpm   Rating of Perceived Exertion (Exercise) 12   Symptoms High blood pressure upon arrival; back pain when walking on the treadmill   Comments Talked with Salome about back pain on the treadmill. Slower speeds seem to help him, he said the pain varies by the day. Each class we will check in with him on how his back feels and proceed accordingly with exercise mode.   Duration Progress to 30 minutes of continuous aerobic without signs/symptoms of physical distress   Intensity Other (comment)  REST + 20   Progression Continue progressive overload as per policy without signs/symptoms or physical distress.   Resistance Training   Training Prescription Yes   Weight 8   Reps 10-15   Interval Training   Interval Training No   Treadmill   MPH 2.2   Grade 0   Minutes 15   NuStep   Level 5  T5 NS   Watts 50   Minutes 20      Nutrition:  Target Goals: Understanding of nutrition guidelines, daily intake of sodium <1560m, cholesterol <2059m calories 30% from fat and 7% or less from saturated fats, daily to have 5 or more servings of fruits and vegetables.  Biometrics:     Pre Biometrics - 04/28/14 1221    Pre Biometrics   Waist Circumference 46 inches   Hip Circumference 49.5 inches   Waist to Hip Ratio 0.93 %       Nutrition Therapy Plan and Nutrition Goals:     Nutrition Therapy & Goals - 10/13/14 1634    Nutrition Therapy   Diet 1800 kcal DASH   Drug/Food Interactions Statins/Certain Fruits   Fiber 30 grams   Whole Grain Foods 3 servings   Protein 8 ounces/day   Saturated Fats 14 max. grams   Fruits and Vegetables 5 servings/day   Personal Nutrition Goals   Personal Goal #1 Make step-by-step changes to increase fruits, vegetables, and whole grain foods   Personal Goal #2 Work to limit processed meats and sweets (small portions, lower fat or sugar  varieties)      Nutrition Discharge: Rate Your Plate Scores:   Nutrition Goals Re-Evaluation:   Psychosocial: Target Goals: Acknowledge presence or absence of depression, maximize coping skills, provide positive support system. Participant is able to verbalize types and ability to use techniques and skills needed for reducing stress and depression.  Initial Review & Psychosocial Screening:     Initial Psych Review & Screening - 10/12/14 1800    Initial Review   Current issues with Current Stress Concerns   Source of Stress Concerns Occupation   FaJarrellYes   Barriers   Psychosocial barriers to participate in program The patient should benefit from training in stress management and relaxation.   Screening Interventions   Interventions Program counselor consult;Encouraged to exercise  TrArdelle Antons going to try to exercise here alot. 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alNVR Incnd needs to take care of the clients       Quality of Life Scores:  Quality of Life - 04/28/14 1219    Quality of Life Scores   Health/Function Pre 13.9 %   Socioeconomic Pre 9.29 %   Psych/Spiritual Pre 17.21 %   Family Pre 23.6 %   GLOBAL Pre 15.06 %      PHQ-9:     Recent Review Flowsheet Data    Depression screen [facility] 2/9 10/12/2014 10/12/2014 09/12/2014 08/12/2014 08/05/2014   Decreased Interest 1 - 0 0 0   Down, Depressed, Hopeless 0 0 0 0 0   PHQ - 2 Score 1 0 0 0 0   Altered sleeping 1 - - - -   Tired, decreased energy 1 - - - -   Feeling bad or failure about yourself  0 - - - -   Trouble concentrating 0 - - - -   Moving slowly or fidgety/restless 0 - - - -   Suicidal thoughts 0 - - - -   PHQ-9 Score 3 - - - -      Psychosocial Evaluation and Intervention:     Psychosocial Evaluation - 10/03/14 1737    Psychosocial Evaluation & Interventions   Interventions Encouraged to exercise with the program and follow  exercise prescription   Comments Counselor met with Mr. Gholson today for initial psychosocial evaluation.  He is a 49 year old who has been diagnosed with Congestive Heart Failure.  he also has other health issues including Asthma, Bronchitis and Sleep Apnea.  Mr. Luffman reports he has a good support system with his significant other and a brother who lives close by.  He states he is not sleeping well and is scheduled for a follow-up sleep study soon to address this.  His appetite is good and he denies a history of depression or anxiety or current symptoms.  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  His goals are to lost weight, get stronger and decrease the number of medications he is taking while in this program.  Counselor will follow up with Mr. Darene Lamer re: his sleep study and recommendations.     Continued Psychosocial Services Needed Yes  Mr. Covin will benefit from the psychoeducational components of this program, especially stress management since he admits his job is very stressful.  He will also benefit from meeting with the dietician to address his weight loss goals.       Psychosocial Re-Evaluation:   Vocational Rehabilitation: Provide vocational rehab assistance to qualifying candidates.   Vocational Rehab Evaluation & Intervention:     Vocational Rehab - 10/12/14 1755    Initial Vocational Rehab Evaluation & Intervention   Assessment shows need for Vocational Rehabilitation No      Education: Education Goals: Education classes will be provided on a weekly basis, covering required topics. Participant will state understanding/return demonstration of topics presented.  Learning Barriers/Preferences:     Learning Barriers/Preferences - 10/12/14 1755    Learning Barriers/Preferences   Learning Barriers None   Learning Preferences None      Education Topics: General Nutrition Guidelines/Fats and  Fiber: -Group instruction provided by verbal, written material, models and posters to present the general guidelines for heart healthy nutrition. Gives an explanation and review of dietary fats and fiber.   Controlling Sodium/Reading Food Labels: -Group verbal and written material supporting the discussion of sodium use in heart healthy nutrition. Review and explanation with models, verbal and written materials for utilization of the food label.  Exercise Physiology & Risk Factors: - Group verbal and written instruction with models to review the exercise physiology of the cardiovascular system and associated critical values. Details cardiovascular disease risk factors and the goals associated with each risk factor.   Aerobic Exercise & Resistance Training: - Gives group verbal and written discussion on the health impact of inactivity. On the components of aerobic and resistive training programs and the benefits of this training and how to safely progress through these programs.   Flexibility, Balance, General Exercise Guidelines: - Provides group verbal and written instruction on the benefits of flexibility and balance training programs. Provides general exercise guidelines with specific guidelines to those with heart or lung disease. Demonstration and skill practice provided.          Cardiac Rehab from 10/06/2014 in [facility] Cardiac Rehab   Date  10/03/14   Educator  SW   Instruction Review Code  2- meets goals/outcomes      Stress Management: - Provides group verbal and written instruction about the health risks of elevated stress, cause of high stress, and healthy ways to reduce stress.   Depression: - Provides group verbal and written instruction on the correlation between heart/lung disease and depressed mood, treatment options, and the stigmas associated with seeking treatment.   Anatomy & Physiology of the Heart: - Group verbal and written instruction and models provide basic  cardiac anatomy and physiology, with the coronary electrical and arterial systems. Review of: AMI, Angina, Valve disease, Heart Failure, Cardiac Arrhythmia, Pacemakers, and the ICD.   Cardiac Procedures: - Group verbal and written instruction and models to describe the testing methods done to diagnose heart disease. Reviews the outcomes of the test results. Describes the treatment choices: Medical Management, Angioplasty, or Coronary Bypass Surgery.   Cardiac Medications: - Group verbal and written instruction to review commonly prescribed medications for heart disease. Reviews the medication, class of the drug, and side effects. Includes the steps to properly store meds and maintain the prescription regimen.   Go Sex-Intimacy & Heart Disease, Get SMART - Goal Setting: - Group verbal and written instruction through game format to discuss heart disease and the return to sexual intimacy. Provides group verbal and written material to discuss and apply goal setting through the application of the S.M.A.R.T. Method.   Other Matters of the Heart: - Provides group verbal, written materials and models to describe Heart Failure, Angina, Valve Disease, and Diabetes in the realm of heart disease. Includes description of the disease process and treatment options available to the cardiac patient.   Exercise & Equipment Safety: - Individual verbal instruction and demonstration of equipment use and safety with use of the equipment.      Cardiac Rehab from 10/12/2014 in [facility] Cardiac Rehab   Date  10/12/14   Educator  C. Avagrace Botelho,RN   Instruction Review Code  1- partially meets, needs review/practice      Infection Prevention: - Provides verbal and written material to individual with discussion of infection control including proper hand washing and proper equipment cleaning during exercise session.      Cardiac Rehab from 10/06/2014 in [facility] Cardiac Rehab   Date  04/28/14   Educator  C. [place]    Instruction Review Code  1- partially meets, needs review/practice      Falls Prevention: - Provides verbal and written material to individual with discussion of falls prevention and safety.      Cardiac Rehab from 10/06/2014 in [facility] Cardiac Rehab   Date  04/28/14   Educator  C. Bijou Easler,RN   Instruction Review Code  1- partially meets, needs review/practice      Diabetes: - Individual verbal and written instruction to review signs/symptoms of diabetes, desired ranges of glucose level fasting, after meals and with exercise. Advice that pre and post exercise glucose checks will be done for 3 sessions at entry of program.      Cardiac Rehab from 10/06/2014 in [facility] Cardiac Rehab   Date  04/28/14   Educator  C. [place]   Instruction Review Code  1- partially meets, needs review/practice       Knowledge Questionnaire Score:   Personal Goals and Risk Factors at Admission:     Personal Goals and Risk Factors at Admission - 10/07/14 1252    Personal Goals and Risk Factors on Admission    Weight Management Yes   Intervention Learn and follow the exercise and diet guidelines while in the program. Utilize the nutrition and education classes to help gain knowledge of the diet and exercise expectations in the program   Increase Aerobic Exercise and Physical Activity Yes   Intervention While in program, learn and follow the exercise prescription taught. Start at a low level workload and increase workload after able to maintain previous level for 30 minutes. Increase time before increasing intensity.   Diabetes Yes   Goal Blood glucose control identified by blood glucose values, HgbA1C. Participant verbalizes understanding of the signs/symptoms of hyper/hypo glycemia, proper foot care and importance of medication and nutrition plan for blood glucose control.   Intervention Provide nutrition & aerobic exercise along with prescribed medications to achieve blood glucose in normal ranges: Fasting  65-99 mg/dL   Hypertension Yes   Goal Participant will see blood pressure controlled within the values of 140/66m/Hg or within value directed by their physician.   Intervention Provide nutrition & aerobic exercise along with prescribed medications to achieve BP 140/90 or less.   Lipids Yes   Goal Cholesterol controlled with medications as prescribed, with individualized exercise RX and with personalized nutrition plan. Value goals: LDL < 755m HDL > 4013mParticipant states understanding of desired cholesterol values and following prescriptions.   Intervention Provide nutrition & aerobic exercise along with prescribed medications to achieve LDL <20m34mDL >40mg31mStress Yes   Goal To meet with psychosocial counselor for stress and relaxation information and guidance. To state understanding of performing relaxation techniques and or identifying personal stressors.   Intervention Provide education on types of stress, identifiying stressors, and ways to cope with stress. Provide demonstration and active practice of relaxation techniques.      Personal Goals and Risk Factors Review:      Goals and Risk Factor Review      10/12/14 1758 10/13/14 1635 10/13/14 1652       Weight Management   Goals Progress/Improvement seen No       Comments Sanay has gained 5 lbs since he was here in April. Appt made with dietician for tomorrow. 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        Increase Aerobic Exercise and Physical Activity   Goals Progress/Improvement seen   (p) Yes Yes     Comments   Worked out on Treadmill today. Marcus's goal weight is 200lbs     Diabetes   Goal   --  Diet controlled diabetes and currently eats regular ice cream but met with  dieticiian today. 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      Hypertension   Goal Participant will see blood pressure controlled within the values of  140/82m/Hg or within value directed by their physician.  Antonyo's blood pressure is much better controlled than in April. Today 150/85.       Stress   Goal To meet with psychosocial counselor for stress and relaxation information and guidance. To state understanding of performing relaxation techniques and or identifying personal stressors.  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   --  Stress at work at times. Is trying to attend Cardiac rehab as much as possible.        Personal Goals Discharge (Final Personal Goals and Risk Factors Review):      Goals and Risk Factor Review - 10/13/14 1652    Increase Aerobic Exercise and Physical Activity   Goals Progress/Improvement seen  Yes   Comments Worked out on Treadmill today. Demyan's goal weight is 200lbs   Diabetes   Goal --  Diet controlled diabetes and currently eats regular ice cream but met with dieticiian today. 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    Stress   Goal --  Stress at work at times. Is trying to attend Cardiac rehab as much as possible.       Comments: TGurviris doing well since he has been able to attend Cardiac Rehab lately. 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

## 2014-10-26 NOTE — Addendum Note (Signed)
Addended by: Gerlene Burdock on: 10/26/2014 06:19 PM   Modules accepted: Orders

## 2014-10-26 NOTE — Progress Notes (Signed)
Cardiac Individual Treatment Plan  Patient Details  Name: Bradley Mckenzie. MRN: [id number] Date of Birth: 11-Mar-1965 Referring Provider:  No ref. provider found  Initial Encounter Date:    Visit Diagnosis: Chronic systolic heart failure (HCC)  Patient's Home Medications on Admission:  Current outpatient prescriptions:  .  albuterol (PROVENTIL HFA;VENTOLIN HFA) 108 (90 BASE) MCG/ACT inhaler, Inhale 2 puffs into the lungs every 6 (six) hours as needed for wheezing or shortness of breath., Disp: , Rfl:  .  aspirin 81 MG tablet, Take 81 mg by mouth daily., Disp: , Rfl:  .  beclomethasone (QVAR) 40 MCG/ACT inhaler, Inhale 2 puffs into the lungs daily., Disp: , Rfl:  .  carvedilol (COREG) 6.25 MG tablet, Take 1 tablet (6.25 mg total) by mouth 2 (two) times daily with a meal., Disp: 60 tablet, Rfl: 5 .  isosorbide-hydrALAZINE (BIDIL) 20-37.5 MG per tablet, Take 1 tablet by mouth 3 (three) times daily., Disp: 90 tablet, Rfl: 3 .  lisinopril (PRINIVIL,ZESTRIL) 20 MG tablet, Take 1 tablet (20 mg total) by mouth daily. (Patient not taking: Reported on 10/06/2014), Disp: 90 tablet, Rfl: 3 .  potassium chloride (K-DUR,KLOR-CON) 10 MEQ tablet, Take 1 tablet (10 mEq total) by mouth 2 (two) times daily., Disp: 60 tablet, Rfl: 3 .  sacubitril-valsartan (ENTRESTO) 24-26 MG, Take 1 tablet by mouth 2 (two) times daily., Disp: , Rfl:  .  simvastatin (ZOCOR) 40 MG tablet, Take 1 tablet (40 mg total) by mouth daily., Disp: 30 tablet, Rfl: 3 .  torsemide (DEMADEX) 20 MG tablet, Take 2 tablets (40 mg total) by mouth daily. Resume torsemide 20 mg from wednesday July 27th thru sun july 31st and then take 40 mg daily thereafter, Disp: 60 tablet, Rfl: 3  Past Medical History: Past Medical History  Diagnosis Date  . Hypertension   . CHF (congestive heart failure)   . Diabetes mellitus without complication   . Obstructive sleep apnea   . Depression     Tobacco Use: History  Smoking status  . Former Smoker   Smokeless tobacco  . 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 Participant demonstrates ability to understand and report RPE using BORG scale, to self-measure pulse accurately, and to acknowledge the importance of the exercise prescription.  Exercise Prescription Goal: Starting with aerobic activity 30 plus minutes a day, 3 days per week for initial exercise prescription. Provide home exercise prescription and guidelines that participant acknowledges understanding prior to discharge.  Activity Barriers & Risk Stratification:   6 Minute Walk:   Initial Exercise Prescription:     Initial Exercise Prescription - 10/07/14 1236    Date of Initial Exercise Prescription   Date 04/28/14   Treadmill   MPH 2   Grade 0   Minutes 15   NuStep   Level 3  Watts 40   Minutes 15   Prescription Details   Frequency (times per week) 3   Intensity   THRR REST +  20   Ratings of Perceived Exertion 11-13   Resistance Training   Training Prescription Yes   Weight 3   Reps 10-12      Exercise Prescription Changes:     Exercise Prescription Changes      10/03/14 1800 10/12/14 1700 10/19/14 0800       Exercise Review   Progression   Yes     Response to Exercise   Blood Pressure (Admit) 180/104 mmHg  Initial BP was 200/110, but was brought down with rest --  Initial BP was 200/110, but was brought down with rest 162/88 mmHg     Blood Pressure (Exercise) 196/94 mmHg  BP on TM was 240/110. Patient  was moved to NS with no arms. --  BP on TM was 240/110. Patient was moved to NS with no arms. 132/80 mmHg     Blood Pressure (Exit) 196/106 mmHg  158/98 mmHg     Heart Rate (Admit)   108 bpm     Heart Rate (Exercise)   124 bpm     Heart Rate (Exit)   104 bpm     Rating of Perceived Exertion (Exercise)   12     Symptoms   High blood pressure upon arrival; back pain when walking on the treadmill     Comments   Talked with Bradley Mckenzie about back pain on the treadmill. Slower speeds seem to help him, he said the pain varies by the day. Each class we will check in with him on how his back feels and proceed accordingly with exercise mode.     Duration   Progress to 30 minutes of continuous aerobic without signs/symptoms of physical distress     Intensity   Other (comment)  REST + 20     Progression   Continue progressive overload as per policy without signs/symptoms or physical distress.     Resistance Training   Training Prescription  Yes Yes     Weight  4 8     Reps   10-15     Interval Training   Interval Training   No     Treadmill   MPH   2.2     Grade   0     Minutes   15     NuStep   Level  3  T5 Nustep 5  T5 NS     Watts  25 50     Minutes   20        Discharge Exercise Prescription (Final Exercise Prescription Changes):     Exercise Prescription Changes - 10/19/14 0800    Exercise Review   Progression Yes   Response to Exercise   Blood Pressure (Admit) 162/88 mmHg   Blood Pressure (Exercise) 132/80 mmHg   Blood Pressure (Exit) 158/98 mmHg   Heart Rate (Admit) 108 bpm   Heart Rate (Exercise) 124 bpm   Heart Rate (Exit) 104 bpm   Rating of Perceived Exertion (Exercise) 12   Symptoms High blood pressure upon arrival; back pain when walking on the treadmill   Comments Talked with Bradley Mckenzie about back pain on the treadmill. Slower speeds seem to help him, he said the pain varies by the day. Each class we will check in with him on how his back feels and proceed accordingly with  exercise mode.   Duration  Progress to 30 minutes of continuous aerobic without signs/symptoms of physical distress   Intensity Other (comment)  REST + 20   Progression Continue progressive overload as per policy without signs/symptoms or physical distress.   Resistance Training   Training Prescription Yes   Weight 8   Reps 10-15   Interval Training   Interval Training No   Treadmill   MPH 2.2   Grade 0   Minutes 15   NuStep   Level 5  T5 NS   Watts 50   Minutes 20      Nutrition:  Target Goals: Understanding of nutrition guidelines, daily intake of sodium '1500mg'$ , cholesterol '200mg'$ , calories 30% from fat and 7% or less from saturated fats, daily to have 5 or more servings of fruits and vegetables.  Biometrics:    Nutrition Therapy Plan and Nutrition Goals:     Nutrition Therapy & Goals - 10/13/14 1634    Nutrition Therapy   Diet 1800 kcal DASH   Drug/Food Interactions Statins/Certain Fruits   Fiber 30 grams   Whole Grain Foods 3 servings   Protein 8 ounces/day   Saturated Fats 14 max. grams   Fruits and Vegetables 5 servings/day   Personal Nutrition Goals   Personal Goal #1 Make step-by-step changes to increase fruits, vegetables, and whole grain foods   Personal Goal #2 Work to limit processed meats and sweets (small portions, lower fat or sugar varieties)      Nutrition Discharge: Rate Your Plate Scores:     Rate Your Plate - 93/73/42 8768    Rate Your Plate Scores   Pre Score 50   Pre Score % 56 %      Nutrition Goals Re-Evaluation:   Psychosocial: Target Goals: Acknowledge presence or absence of depression, maximize coping skills, provide positive support system. Participant is able to verbalize types and ability to use techniques and skills needed for reducing stress and depression.  Initial Review & Psychosocial Screening:     Initial Psych Review & Screening - 10/12/14 1800    Initial Review   Current issues with Current Stress Concerns    Source of Stress Concerns Occupation   [place]? Yes   Barriers   Psychosocial barriers to participate in program The patient should benefit from training in stress management and relaxation.   Screening Interventions   Interventions Program counselor consult;Encouraged to exercise  [name] is going to try to exercise here alot. 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       Quality of Life Scores:   PHQ-9:     Recent Review Flowsheet Data    Depression screen [facility] 2/9 10/12/2014 10/12/2014 09/12/2014 08/12/2014 08/05/2014   Decreased Interest 1 - 0 0 0   Down, Depressed, Hopeless 0 0 0 0 0   PHQ - 2 Score 1 0 0 0 0   Altered sleeping 1 - - - -   Tired, decreased energy 1 - - - -   Feeling bad or failure about yourself  0 - - - -   Trouble concentrating 0 - - - -   Moving slowly or fidgety/restless 0 - - - -   Suicidal thoughts 0 - - - -   PHQ-9 Score 3 - - - -      Psychosocial Evaluation and Intervention:     Psychosocial Evaluation -  10/03/14 1737    Psychosocial Evaluation & Interventions   Interventions Encouraged to exercise with the program and follow exercise prescription   Comments Counselor met with Mr. Staten today for initial psychosocial evaluation.  He is a 49 year old who has been diagnosed with Congestive Heart Failure.  he also has other health issues including Asthma, Bronchitis and Sleep Apnea.  Mr. Kotowski reports he has a good support system with his significant other and a brother who lives close by.  He states he is not sleeping well and is scheduled for a follow-up sleep study soon to address this.  His appetite is good and he denies a history of depression or anxiety or current symptoms.  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  His goals are to lost weight, get stronger and  decrease the number of medications he is taking while in this program.  Counselor will follow up with Mr. Darene Lamer re: his sleep study and recommendations.     Continued Psychosocial Services Needed Yes  Mr. Symes will benefit from the psychoeducational components of this program, especially stress management since he admits his job is very stressful.  He will also benefit from meeting with the dietician to address his weight loss goals.       Psychosocial Re-Evaluation:   Vocational Rehabilitation: Provide vocational rehab assistance to qualifying candidates.   Vocational Rehab Evaluation & Intervention:     Vocational Rehab - 10/12/14 1755    Initial Vocational Rehab Evaluation & Intervention   Assessment shows need for Vocational Rehabilitation No      Education: Education Goals: Education classes will be provided on a weekly basis, covering required topics. Participant will state understanding/return demonstration of topics presented.  Learning Barriers/Preferences:     Learning Barriers/Preferences - 10/12/14 1755    Learning Barriers/Preferences   Learning Barriers None   Learning Preferences None      Education Topics: General Nutrition Guidelines/Fats and Fiber: -Group instruction provided by verbal, written material, models and posters to present the general guidelines for heart healthy nutrition. Gives an explanation and review of dietary fats and fiber.   Controlling Sodium/Reading Food Labels: -Group verbal and written material supporting the discussion of sodium use in heart healthy nutrition. Review and explanation with models, verbal and written materials for utilization of the food label.   Exercise Physiology & Risk Factors: - Group verbal and written instruction with models to review the exercise physiology of the cardiovascular system and associated critical values. Details cardiovascular disease risk factors and the goals associated with each risk  factor.   Aerobic Exercise & Resistance Training: - Gives group verbal and written discussion on the health impact of inactivity. On the components of aerobic and resistive training programs and the benefits of this training and how to safely progress through these programs.   Flexibility, Balance, General Exercise Guidelines: - Provides group verbal and written instruction on the benefits of flexibility and balance training programs. Provides general exercise guidelines with specific guidelines to those with heart or lung disease. Demonstration and skill practice provided.          Cardiac Rehab from 10/06/2014 in [facility]   Date  10/03/14   Educator  SW   Instruction Review Code  2- meets goals/outcomes      Stress Management: - Provides group verbal and written instruction about the health risks of elevated stress, cause of high stress,  and healthy ways to reduce stress.   Depression: - Provides group verbal and written instruction on the correlation between heart/lung disease and depressed mood, treatment options, and the stigmas associated with seeking treatment.   Anatomy & Physiology of the Heart: - Group verbal and written instruction and models provide basic cardiac anatomy and physiology, with the coronary electrical and arterial systems. Review of: AMI, Angina, Valve disease, Heart Failure, Cardiac Arrhythmia, Pacemakers, and the ICD.   Cardiac Procedures: - Group verbal and written instruction and models to describe the testing methods done to diagnose heart disease. Reviews the outcomes of the test results. Describes the treatment choices: Medical Management, Angioplasty, or Coronary Bypass Surgery.   Cardiac Medications: - Group verbal and written instruction to review commonly prescribed medications for heart disease. Reviews the medication, class of the drug, and side effects. Includes the steps to properly store meds and maintain the prescription  regimen.   Go Sex-Intimacy & Heart Disease, Get SMART - Goal Setting: - Group verbal and written instruction through game format to discuss heart disease and the return to sexual intimacy. Provides group verbal and written material to discuss and apply goal setting through the application of the S.M.A.R.T. Method.   Other Matters of the Heart: - Provides group verbal, written materials and models to describe Heart Failure, Angina, Valve Disease, and Diabetes in the realm of heart disease. Includes description of the disease process and treatment options available to the cardiac patient.   Exercise & Equipment Safety: - Individual verbal instruction and demonstration of equipment use and safety with use of the equipment.      Cardiac Rehab from 10/12/2014 in [facility] Cardiac Rehab   Date  10/12/14   Educator  C. Ruchy Wildrick,RN   Instruction Review Code  1- partially meets, needs review/practice      Infection Prevention: - Provides verbal and written material to individual with discussion of infection control including proper hand washing and proper equipment cleaning during exercise session.      Cardiac Rehab from 10/06/2014 in [facility] Cardiac Re[facility]   Date  04/28/14   Educator  C. Coaling   Instruction Review Code  1- partially meets, needs review/practice      Falls Prevention: - Provides verbal and written material to individual with discussion of falls prevention and safety.      Cardiac Rehab from 10/06/2014 in [facility] Cardiac Rehab   Date  04/28/14   Educator  C. Jusiah Aguayo,RN   Instruction Review Code  1- partially meets, needs review/practice      Diabetes: - Individual verbal and written instruction to review signs/symptoms of diabetes, desired ranges of glucose level fasting, after meals and with exercise. Advice that pre and post exercise glucose checks will be done for 3 sessions at entry of program.      Cardiac Rehab from 10/06/2014 in [facility] Cardiac Rehab   Date  04/28/14    Educator  C. Darlington   Instruction Review Code  1- partially meets, needs review/practice       Knowledge Questionnaire Score:   Personal Goals and Risk Factors at Admission:     Personal Goals and Risk Factors at Admission - 10/07/14 1252    Personal Goals and Risk Factors on Admission    Weight Management Yes   Intervention Learn and follow the exercise and diet guidelines while in the program. Utilize the nutrition and education classes to help gain knowledge of the diet and exercise expectations in the program   Increase Aerobic  Exercise and Physical Activity Yes   Intervention While in program, learn and follow the exercise prescription taught. Start at a low level workload and increase workload after able to maintain previous level for 30 minutes. Increase time before increasing intensity.   Diabetes Yes   Goal Blood glucose control identified by blood glucose values, HgbA1C. Participant verbalizes understanding of the signs/symptoms of hyper/hypo glycemia, proper foot care and importance of medication and nutrition plan for blood glucose control.   Intervention Provide nutrition & aerobic exercise along with prescribed medications to achieve blood glucose in normal ranges: Fasting 65-99 mg/dL   Hypertension Yes   Goal Participant will see blood pressure controlled within the values of 140/56mm/Hg or within value directed by their physician.   Intervention Provide nutrition & aerobic exercise along with prescribed medications to achieve BP 140/90 or less.   Lipids Yes   Goal Cholesterol controlled with medications as prescribed, with individualized exercise RX and with personalized nutrition plan. Value goals: LDL < $Rem'70mg'UVik$ , HDL > $Rem'40mg'Uyiz$ . Participant states understanding of desired cholesterol values and following prescriptions.   Intervention Provide nutrition & aerobic exercise along with prescribed medications to achieve LDL '70mg'$ , HDL >$Remo'40mg'CjGWx$ .   Stress Yes   Goal To meet with  psychosocial counselor for stress and relaxation information and guidance. To state understanding of performing relaxation techniques and or identifying personal stressors.   Intervention Provide education on types of stress, identifiying stressors, and ways to cope with stress. Provide demonstration and active practice of relaxation techniques.      Personal Goals and Risk Factors Review:      Goals and Risk Factor Review      10/12/14 1758 10/13/14 1635 10/13/14 1652       Weight Management   Goals Progress/Improvement seen No       Comments Ejay has gained 5 lbs since he was here in April. Appt made with dietician for tomorrow. 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        Increase Aerobic Exercise and Physical Activity   Goals Progress/Improvement seen   (p) Yes Yes     Comments   Worked out on Treadmill today. Rune's goal weight is 200lbs     Diabetes   Goal   --  Diet controlled diabetes and currently eats regular ice cream but met with dieticiian today. 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      Hypertension   Goal Participant will see blood pressure controlled within the values of 140/30mm/Hg or within value directed by their physician.  Latwan's blood pressure is much better controlled than in April. Today 150/85.       Stress   Goal To meet with psychosocial counselor for stress and relaxation information and guidance. To state understanding of performing relaxation techniques and or identifying personal stressors.  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   --  Stress at work at times. Is trying to attend Cardiac rehab as much as possible.        Personal Goals Discharge (Final Personal Goals and Risk Factors Review):      Goals and Risk Factor Review - 10/13/14 1652    Increase Aerobic Exercise and Physical Activity   Goals  Progress/Improvement seen  Yes   Comments Worked out on Treadmill today. Abdulwahab's goal weight is 200lbs  Diabetes   Goal --  Diet controlled diabetes and currently eats regular ice cream but met with dieticiian today. 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    Stress   Goal --  Stress at work at times. Is trying to attend Cardiac rehab as much as possible.       Comments: Has not been to Cardiac Rehab lately maybe due to work schedule.

## 2014-11-11 ENCOUNTER — Ambulatory Visit: Payer: No Typology Code available for payment source | Attending: Family | Admitting: Family

## 2014-11-11 ENCOUNTER — Encounter: Payer: Self-pay | Admitting: Family

## 2014-11-11 VITALS — BP 158/92 | HR 105 | Temp 97.7°F | Resp 20 | Ht 69.0 in | Wt 296.0 lb

## 2014-11-11 DIAGNOSIS — I10 Essential (primary) hypertension: Secondary | ICD-10-CM | POA: Diagnosis not present

## 2014-11-11 DIAGNOSIS — R0602 Shortness of breath: Secondary | ICD-10-CM | POA: Insufficient documentation

## 2014-11-11 DIAGNOSIS — I5022 Chronic systolic (congestive) heart failure: Secondary | ICD-10-CM | POA: Diagnosis present

## 2014-11-11 DIAGNOSIS — J0100 Acute maxillary sinusitis, unspecified: Secondary | ICD-10-CM | POA: Insufficient documentation

## 2014-11-11 DIAGNOSIS — Z87891 Personal history of nicotine dependence: Secondary | ICD-10-CM | POA: Diagnosis not present

## 2014-11-11 DIAGNOSIS — R21 Rash and other nonspecific skin eruption: Secondary | ICD-10-CM | POA: Diagnosis not present

## 2014-11-11 DIAGNOSIS — Z79899 Other long term (current) drug therapy: Secondary | ICD-10-CM | POA: Diagnosis not present

## 2014-11-11 DIAGNOSIS — E119 Type 2 diabetes mellitus without complications: Secondary | ICD-10-CM | POA: Insufficient documentation

## 2014-11-11 DIAGNOSIS — J329 Chronic sinusitis, unspecified: Secondary | ICD-10-CM | POA: Insufficient documentation

## 2014-11-11 DIAGNOSIS — Z7982 Long term (current) use of aspirin: Secondary | ICD-10-CM | POA: Insufficient documentation

## 2014-11-11 DIAGNOSIS — F329 Major depressive disorder, single episode, unspecified: Secondary | ICD-10-CM | POA: Diagnosis not present

## 2014-11-11 DIAGNOSIS — G4733 Obstructive sleep apnea (adult) (pediatric): Secondary | ICD-10-CM | POA: Diagnosis not present

## 2014-11-11 MED ORDER — AZITHROMYCIN 250 MG PO TABS: ORAL_TABLET | ORAL | Status: DC

## 2014-11-11 NOTE — Progress Notes (Signed)
Subjective:    Patient ID: Bradley Pitts., male    DOB: Nov 02, 1965, 49 y.o.   MRN: [id number]  Congestive Heart Failure Presents for follow-up visit. The disease course has been stable. Associated symptoms include edema, fatigue and shortness of breath. Pertinent negatives include no abdominal pain, chest pain, chest pressure, orthopnea or palpitations. The symptoms have been stable. Past treatments include angiotensin receptor blockers, beta blockers and salt and fluid restriction. The treatment provided moderate relief. Compliance with prior treatments has been good. His past medical history is significant for DM and HTN. He has multiple 1st degree relatives with heart disease. Compliance with total regimen is 76-100%.  Cough This is a new problem. The current episode started yesterday. The problem has been unchanged. The problem occurs every few hours. The cough is productive of sputum. Associated symptoms include nasal congestion, postnasal drip, a rash (right lower leg) and shortness of breath. Pertinent negatives include no chest pain, chills, fever, headaches or sore throat. Nothing aggravates the symptoms. He has tried nothing for the symptoms.  Sinus Problem This is a new problem. The current episode started yesterday. The problem has been gradually worsening since onset. There has been no fever. The fever has been present for 1 to 2 days. His pain is at a severity of 4/10. The pain is mild. Associated symptoms include congestion, coughing (from drainage), a hoarse voice, shortness of breath and sinus pressure. Pertinent negatives include no chills, headaches, neck pain, sneezing or sore throat. Past treatments include nothing.  Other This is a recurrent (rash) problem. The current episode started more than 1 month ago. The problem occurs daily. The problem has been unchanged. Associated symptoms include congestion, coughing (from drainage), fatigue and a rash (right lower leg). Pertinent  negatives include no abdominal pain, chest pain, chills, fever, headaches, neck pain or sore throat. Nothing aggravates the symptoms. He has tried nothing for the symptoms.   Past Medical History  Diagnosis Date  . Hypertension   . CHF (congestive heart failure) ([place])   . Diabetes mellitus without complication ([place])   . Obstructive sleep apnea   . Depression     Past Surgical History  Procedure Laterality Date  . Hernia repair  2004  . Family History  Problem Relation Age of Onset  . Heart disease Mother   . Heart attack Father 71  . Diabetes Father   . Cancer Father   . Cancer Sister     Social History  Substance Use Topics  . Smoking status: Former Research scientist (life sciences)  . Smokeless tobacco: Never Used  . Alcohol Use: No     Comment: occassional alcohol use at special events    No Known Allergies  Prior to Admission medications   Medication Sig Start Date End Date Taking? Authorizing Provider  albuterol (PROVENTIL HFA;VENTOLIN HFA) 108 (90 BASE) MCG/ACT inhaler Inhale 2 puffs into the lungs every 6 (six) hours as needed for wheezing or shortness of breath.   Yes Historical Provider, MD  aspirin 81 MG tablet Take 81 mg by mouth daily.   Yes Historical Provider, MD  beclomethasone (QVAR) 40 MCG/ACT inhaler Inhale 2 puffs into the lungs daily.   Yes Historical Provider, MD  carvedilol (COREG) 6.25 MG tablet Take 1 tablet (6.25 mg total) by mouth 2 (two) times daily with a meal. 07/29/14  Yes Alisa Graff, FNP  isosorbide-hydrALAZINE (BIDIL) 20-37.5 MG per tablet Take 1 tablet by mouth 3 (three) times daily. 07/29/14  Yes Alisa Graff, [place]  lisinopril (PRINIVIL,ZESTRIL) 20 MG tablet Take 1 tablet (20 mg total) by mouth daily. 09/13/14  Yes Alisa Graff, FNP  potassium chloride (K-DUR,KLOR-CON) 10 MEQ tablet Take 1 tablet (10 mEq total) by mouth 2 (two) times daily. 07/29/14  Yes Alisa Graff, FNP  simvastatin (ZOCOR) 40 MG tablet Take 1 tablet (40 mg total) by mouth daily. 07/29/14  Yes  Alisa Graff, FNP  torsemide (DEMADEX) 20 MG tablet Take 2 tablets (40 mg total) by mouth daily. Resume torsemide 20 mg from wednesday July 27th thru sun july 31st and then take 40 mg daily thereafter 08/08/14  Yes Fritzi Mandes, MD  azithromycin (ZITHROMAX Z-PAK) 250 MG tablet Take 2 tablets on day 1 and then 1 tablet daily for the next 4 days 11/11/14   Alisa Graff, FNP      Review of Systems  Constitutional: Positive for fatigue. Negative for fever, chills and appetite change.  HENT: Positive for congestion, hoarse voice, postnasal drip and sinus pressure. Negative for sneezing and sore throat.   Eyes: Negative.   Respiratory: Positive for cough (from drainage) and shortness of breath. Negative for chest tightness.   Cardiovascular: Negative for chest pain and palpitations.  Gastrointestinal: Negative for abdominal pain and abdominal distention.  Endocrine: Negative.   Genitourinary: Negative.   Musculoskeletal: Negative for back pain and neck pain.  Skin: Positive for rash (right lower leg). Negative for wound.  Allergic/Immunologic: Negative.   Neurological: Negative for dizziness, light-headedness and headaches.  Hematological: Negative for adenopathy. Does not bruise/bleed easily.  Psychiatric/Behavioral: Positive for sleep disturbance (head congestion). Negative for decreased concentration. The patient is not nervous/anxious.        Objective:   Physical Exam  Constitutional: He appears well-developed and well-nourished.  HENT:  Head: Normocephalic and atraumatic.  Eyes: Conjunctivae are normal.  Neck: Neck supple.  Cardiovascular: Regular rhythm.  Tachycardia present.   Pulmonary/Chest: Effort normal. He has no wheezes. He has no rales.  Abdominal: Soft. He exhibits distension. There is no tenderness.  Musculoskeletal: He exhibits edema (1+ bilateral lower legs). He exhibits no tenderness.  Neurological: He is alert.  Skin: Skin is warm and dry.  Psychiatric: He has a  normal mood and affect. His behavior is normal. Thought content normal.  Nursing note and vitals reviewed.   BP 158/92 mmHg  Pulse 105  Temp(Src) 97.7 F (36.5 C)  Resp 20  Ht 5\' 9"  (1.753 m)  Wt 296 lb (134.265 kg)  BMI 43.69 kg/m2  SpO2 96%       Assessment & Plan:  1: Chronic heart failure with reduced ejection fraction- Patient presents with shortness of breath and fatigue upon exertion. He says that he's been feeling tired but says that he's recently developed cold type symptoms as well. He continues to weigh himself and has noticed a gradual weight gain. Reminded to call for an overnight weight gain of >2 pounds or a weekly weight gain of >5 pounds. He is not adding any salt to his food and tries to follow a low sodium diet. Continues to have chronic edema in his lower legs and tries to elevate them as much as possible but is on his feet a lot with his job.  2: HTN- Blood pressure a little elevated today but patient also doesn't feel well. 3: Acute maxillary sinusitis- Patient developed head congestion and feels quite a bit of sinus pressure over his cheekbones and behind his eyes. Denies any fever or chills. Zpack sent to the  pharmacy so if symptoms worsen over the weekend, he can get it filled. 4: Obstructive sleep apnea- Patient is still waiting on his new supplies. Will refax form to [facility]. 5: Rash- Patient feels like his rash on his lower leg is worsening. Encouraged him to speak with his PCP or he could make a dermatologist appointment itself if he doesn't need a referral. Patient does admit that he's not sure he could afford to go see a dermatologist.  Return in 2 months or sooner for any questions/problems before the next office visit.

## 2014-11-11 NOTE — Patient Instructions (Signed)
Continue weighing daily and call for an overnight weight gain of > 2 pounds or a weekly weight gain of >5 pounds. 

## 2014-11-14 ENCOUNTER — Encounter: Payer: Self-pay | Admitting: *Deleted

## 2014-11-16 ENCOUNTER — Telehealth: Payer: Self-pay | Admitting: *Deleted

## 2014-11-16 ENCOUNTER — Encounter: Payer: No Typology Code available for payment source | Attending: Internal Medicine

## 2014-11-16 DIAGNOSIS — I5022 Chronic systolic (congestive) heart failure: Secondary | ICD-10-CM | POA: Insufficient documentation

## 2014-11-16 NOTE — Telephone Encounter (Signed)
Called to check on status to return to program. Left message on machine.

## 2014-11-22 ENCOUNTER — Encounter: Payer: Self-pay | Admitting: *Deleted

## 2014-11-22 DIAGNOSIS — I5022 Chronic systolic (congestive) heart failure: Secondary | ICD-10-CM

## 2014-11-22 NOTE — Progress Notes (Signed)
Cardiac Individual Treatment Plan  Patient Details  Name: Nolyn Swab. MRN: [id number] Date of Birth: 11-12-1965 Referring Provider:  Corey Skains, MD  Initial Encounter Date:    Visit Diagnosis: Chronic systolic heart failure ([place])  Patient's Home Medications on Admission:  Current outpatient prescriptions:  .  albuterol (PROVENTIL HFA;VENTOLIN HFA) 108 (90 BASE) MCG/ACT inhaler, Inhale 2 puffs into the lungs every 6 (six) hours as needed for wheezing or shortness of breath., Disp: , Rfl:  .  aspirin 81 MG tablet, Take 81 mg by mouth daily., Disp: , Rfl:  .  azithromycin (ZITHROMAX Z-PAK) 250 MG tablet, Take 2 tablets on day 1 and then 1 tablet daily for the next 4 days, Disp: 6 each, Rfl: 0 .  beclomethasone (QVAR) 40 MCG/ACT inhaler, Inhale 2 puffs into the lungs daily., Disp: , Rfl:  .  carvedilol (COREG) 6.25 MG tablet, Take 1 tablet (6.25 mg total) by mouth 2 (two) times daily with a meal., Disp: 60 tablet, Rfl: 5 .  isosorbide-hydrALAZINE (BIDIL) 20-37.5 MG per tablet, Take 1 tablet by mouth 3 (three) times daily., Disp: 90 tablet, Rfl: 3 .  lisinopril (PRINIVIL,ZESTRIL) 20 MG tablet, Take 1 tablet (20 mg total) by mouth daily., Disp: 90 tablet, Rfl: 3 .  potassium chloride (K-DUR,KLOR-CON) 10 MEQ tablet, Take 1 tablet (10 mEq total) by mouth 2 (two) times daily., Disp: 60 tablet, Rfl: 3 .  simvastatin (ZOCOR) 40 MG tablet, Take 1 tablet (40 mg total) by mouth daily., Disp: 30 tablet, Rfl: 3 .  torsemide (DEMADEX) 20 MG tablet, Take 2 tablets (40 mg total) by mouth daily. Resume torsemide 20 mg from wednesday July 27th thru sun july 31st and then take 40 mg daily thereafter, Disp: 60 tablet, Rfl: 3  Past Medical History: Past Medical History  Diagnosis Date  . Hypertension   . CHF (congestive heart failure) ([place])   . Diabetes mellitus without complication ([place])   . Obstructive sleep apnea   . Depression     Tobacco Use: History  Smoking status  . Former Smoker   Smokeless tobacco  . Never Used    Labs: Recent [employer] for ITP Cardiac and Pulmonary Rehab Latest Ref Rng 02/08/2014 08/08/2014   Cholestrol 0-200 mg/dL 161 -   LDLCALC 0-100 mg/dL 112(H) -   HDL 40-60 mg/dL 33(L) -   Trlycerides 0-200 mg/dL 79 -   Hemoglobin A1c 4.0 - 6.0 % - 6.9(H)         POCT Glucose      10/13/14 1652           POCT Blood Glucose   Pre-Exercise 104 mg/dL          Exercise Target Goals:    Exercise Program Goal: Individual exercise prescription set with THRR, safety & activity barriers. Participant demonstrates ability to understand and report RPE using BORG scale, to self-measure pulse accurately, and to acknowledge the importance of the exercise prescription.  Exercise Prescription Goal: Starting with aerobic activity 30 plus minutes a day, 3 days per week for initial exercise prescription. Provide home exercise prescription and guidelines that participant acknowledges understanding prior to discharge.  Activity Barriers & Risk Stratification:   6 Minute Walk:   Initial Exercise Prescription:     Initial Exercise Prescription - 10/07/14 1236    Date of Initial Exercise Prescription   Date 04/28/14   Treadmill   MPH 2   Grade 0   Minutes 15  NuStep   Level 3   Watts 40   Minutes 15   Prescription Details   Frequency (times per week) 3   Intensity   THRR REST +  20   Ratings of Perceived Exertion 11-13   Resistance Training   Training Prescription Yes   Weight 3   Reps 10-12      Exercise Prescription Changes:     Exercise Prescription Changes      10/03/14 1800 10/12/14 1700 10/19/14 0800 11/14/14 1100     Exercise Review   Progression   Yes No  Absent since last review; last visit 10/13/14    Response to Exercise   Blood Pressure (Admit) 180/104 mmHg  Initial BP was 200/110, but was brought down with rest --  Initial BP was 200/110, but was brought down with rest 162/88 mmHg     Blood  Pressure (Exercise) 196/94 mmHg  BP on TM was 240/110. Patient was moved to NS with no arms. --  BP on TM was 240/110. Patient was moved to NS with no arms. 132/80 mmHg     Blood Pressure (Exit) 196/106 mmHg  158/98 mmHg     Heart Rate (Admit)   108 bpm     Heart Rate (Exercise)   124 bpm     Heart Rate (Exit)   104 bpm     Rating of Perceived Exertion (Exercise)   12     Symptoms   High blood pressure upon arrival; back pain when walking on the treadmill     Comments   Talked with Hiroto about back pain on the treadmill. Slower speeds seem to help him, he said the pain varies by the day. Each class we will check in with him on how his back feels and proceed accordingly with exercise mode. No progression will made until he returns     Duration   Progress to 30 minutes of continuous aerobic without signs/symptoms of physical distress Progress to 30 minutes of continuous aerobic without signs/symptoms of physical distress    Intensity   Other (comment)  REST + 20 Other (comment)  REST + 20    Progression   Continue progressive overload as per policy without signs/symptoms or physical distress. Continue progressive overload as per policy without signs/symptoms or physical distress.    Resistance Training   Training Prescription  Yes Yes Yes    Weight  _0 Reps   10-15 10-15    Interval Training   Interval Training   No No    Treadmill   MPH   2.2 2.2    Grade   0 0    Minutes   15 15    NuStep   Level  3  T5 Nustep 5  T5 NS 5  T5 NS    Watts  25 50 50    Minutes   20 20       Discharge Exercise Prescription (Final Exercise Prescription Changes):     Exercise Prescription Changes - 11/14/14 1100    Exercise Review   Progression No  Absent since last review; last visit 10/13/14   Response to Exercise   Comments No progression will made until he returns    Duration Progress to 30 minutes of continuous aerobic without signs/symptoms of physical distress   Intensity Other  (comment)  REST + 20   Progression Continue progressive overload as per policy without signs/symptoms or physical distress.   Resistance Training  Training Prescription Yes   Weight 8   Reps 10-15   Interval Training   Interval Training No   Treadmill   MPH 2.2   Grade 0   Minutes 15   NuStep   Level 5  T5 NS   Watts 50   Minutes 20      Nutrition:  Target Goals: Understanding of nutrition guidelines, daily intake of sodium '1500mg'$ , cholesterol '200mg'$ , calories 30% from fat and 7% or less from saturated fats, daily to have 5 or more servings of fruits and vegetables.  Biometrics:    Nutrition Therapy Plan and Nutrition Goals:     Nutrition Therapy & Goals - 10/13/14 1634    Nutrition Therapy   Diet 1800 kcal DASH   Drug/Food Interactions Statins/Certain Fruits   Fiber 30 grams   Whole Grain Foods 3 servings   Protein 8 ounces/day   Saturated Fats 14 max. grams   Fruits and Vegetables 5 servings/day   Personal Nutrition Goals   Personal Goal #1 Make step-by-step changes to increase fruits, vegetables, and whole grain foods   Personal Goal #2 Work to limit processed meats and sweets (small portions, lower fat or sugar varieties)      Nutrition Discharge: Rate Your Plate Scores:     Rate Your Plate - 25/36/64 4034    Rate Your Plate Scores   Pre Score 50   Pre Score % 56 %      Nutrition Goals Re-Evaluation:   Psychosocial: Target Goals: Acknowledge presence or absence of depression, maximize coping skills, provide positive support system. Participant is able to verbalize types and ability to use techniques and skills needed for reducing stress and depression.  Initial Review & Psychosocial Screening:     Initial Psych Review & Screening - 10/12/14 1800    Initial Review   Current issues with Current Stress Concerns   Source of Stress Concerns Occupation   [place]? Yes   Barriers   Psychosocial barriers to participate  in program The patient should benefit from training in stress management and relaxation.   Screening Interventions   Interventions Program counselor consult;Encouraged to exercise  [name] is going to try to exercise here alot. 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       Quality of Life Scores:   PHQ-9:     Recent Review Flowsheet Data    Depression screen [facility] 2/9 11/11/2014 10/12/2014 10/12/2014 09/12/2014 08/12/2014   Decreased Interest 0 1 - 0 0   Down, Depressed, Hopeless 0 0 0 0 0   PHQ - 2 Score 0 1 0 0 0   Altered sleeping - 1 - - -   Tired, decreased energy - 1 - - -   Feeling bad or failure about yourself  - 0 - - -   Trouble concentrating - 0 - - -   Moving slowly or fidgety/restless - 0 - - -   Suicidal thoughts - 0 - - -   PHQ-9 Score - 3 - - -      Psychosocial Evaluation and Intervention:     Psychosocial Evaluation - 10/03/14 1737    Psychosocial Evaluation & Interventions   Interventions Encouraged to exercise with the program and follow exercise prescription   Comments Counselor met with Mr. Tortorella today for initial psychosocial evaluation.  He is a 49 year  old who has been diagnosed with Congestive Heart Failure.  he also has other health issues including Asthma, Bronchitis and Sleep Apnea.  Mr. Burgo reports he has a good support system with his significant other and a brother who lives close by.  He states he is not sleeping well and is scheduled for a follow-up sleep study soon to address this.  His appetite is good and he denies a history of depression or anxiety or current symptoms.  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  His goals are to lost weight, get stronger and decrease the number of medications he is taking while in this program.  Counselor will follow up with Mr. Darene Lamer re: his sleep study and  recommendations.     Continued Psychosocial Services Needed Yes  Mr. Lingenfelter will benefit from the psychoeducational components of this program, especially stress management since he admits his job is very stressful.  He will also benefit from meeting with the dietician to address his weight loss goals.       Psychosocial Re-Evaluation:   Vocational Rehabilitation: Provide vocational rehab assistance to qualifying candidates.   Vocational Rehab Evaluation & Intervention:     Vocational Rehab - 10/12/14 1755    Initial Vocational Rehab Evaluation & Intervention   Assessment shows need for Vocational Rehabilitation No      Education: Education Goals: Education classes will be provided on a weekly basis, covering required topics. Participant will state understanding/return demonstration of topics presented.  Learning Barriers/Preferences:     Learning Barriers/Preferences - 10/12/14 1755    Learning Barriers/Preferences   Learning Barriers None   Learning Preferences None      Education Topics: General Nutrition Guidelines/Fats and Fiber: -Group instruction provided by verbal, written material, models and posters to present the general guidelines for heart healthy nutrition. Gives an explanation and review of dietary fats and fiber.   Controlling Sodium/Reading Food Labels: -Group verbal and written material supporting the discussion of sodium use in heart healthy nutrition. Review and explanation with models, verbal and written materials for utilization of the food label.   Exercise Physiology & Risk Factors: - Group verbal and written instruction with models to review the exercise physiology of the cardiovascular system and associated critical values. Details cardiovascular disease risk factors and the goals associated with each risk factor.   Aerobic Exercise & Resistance Training: - Gives group verbal and written discussion on the health impact of inactivity. On the  components of aerobic and resistive training programs and the benefits of this training and how to safely progress through these programs.   Flexibility, Balance, General Exercise Guidelines: - Provides group verbal and written instruction on the benefits of flexibility and balance training programs. Provides general exercise guidelines with specific guidelines to those with heart or lung disease. Demonstration and skill practice provided.          Cardiac Rehab from 10/06/2014 in [facility] Cardiac Rehab   Date  10/03/14   Educator  SW   Instruction Review Code  2- meets goals/outcomes      Stress Management: - Provides group verbal and written instruction about the health risks of elevated stress, cause of high stress, and healthy ways to reduce stress.   Depression: - Provides group verbal and written instruction on the correlation between heart/lung disease and depressed mood, treatment options, and the stigmas associated with seeking treatment.   Anatomy & Physiology of the  Heart: - Group verbal and written instruction and models provide basic cardiac anatomy and physiology, with the coronary electrical and arterial systems. Review of: AMI, Angina, Valve disease, Heart Failure, Cardiac Arrhythmia, Pacemakers, and the ICD.   Cardiac Procedures: - Group verbal and written instruction and models to describe the testing methods done to diagnose heart disease. Reviews the outcomes of the test results. Describes the treatment choices: Medical Management, Angioplasty, or Coronary Bypass Surgery.   Cardiac Medications: - Group verbal and written instruction to review commonly prescribed medications for heart disease. Reviews the medication, class of the drug, and side effects. Includes the steps to properly store meds and maintain the prescription regimen.   Go Sex-Intimacy & Heart Disease, Get SMART - Goal Setting: - Group verbal and written instruction through game format to discuss heart  disease and the return to sexual intimacy. Provides group verbal and written material to discuss and apply goal setting through the application of the S.M.A.R.T. Method.   Other Matters of the Heart: - Provides group verbal, written materials and models to describe Heart Failure, Angina, Valve Disease, and Diabetes in the realm of heart disease. Includes description of the disease process and treatment options available to the cardiac patient.   Exercise & Equipment Safety: - Individual verbal instruction and demonstration of equipment use and safety with use of the equipment.      Cardiac Rehab from 10/12/2014 in [facility] Cardiac Rehab   Date  10/12/14   Educator  C. [place],RN   Instruction Review Code  1- partially meets, needs review/practice      Infection Prevention: - Provides verbal and written material to individual with discussion of infection control including proper hand washing and proper equipment cleaning during exercise session.      Cardiac Rehab from 10/06/2014 in [facility] Cardiac Rehab   Date  04/28/14   Educator  C. [place]   Instruction Review Code  1- partially meets, needs review/practice      Falls Prevention: - Provides verbal and written material to individual with discussion of falls prevention and safety.      Cardiac Rehab from 10/06/2014 in [facility] Cardiac Rehab   Date  04/28/14   Educator  C. Enterkin,RN   Instruction Review Code  1- partially meets, needs review/practice      Diabetes: - Individual verbal and written instruction to review signs/symptoms of diabetes, desired ranges of glucose level fasting, after meals and with exercise. Advice that pre and post exercise glucose checks will be done for 3 sessions at entry of program.      Cardiac Rehab from 10/06/2014 in [facility] Cardiac Rehab   Date  04/28/14   Educator  C. [place]   Instruction Review Code  1- partially meets, needs review/practice       Knowledge Questionnaire Score:   Personal  Goals and Risk Factors at Admission:     Personal Goals and Risk Factors at Admission - 10/07/14 1252    Personal Goals and Risk Factors on Admission    Weight Management Yes   Intervention Learn and follow the exercise and diet guidelines while in the program. Utilize the nutrition and education classes to help gain knowledge of the diet and exercise expectations in the program   Increase Aerobic Exercise and Physical Activity Yes   Intervention While in program, learn and follow the exercise prescription taught. Start at a low level workload and increase workload after able to maintain previous level for 30 minutes. Increase time before increasing intensity.  Diabetes Yes   Goal Blood glucose control identified by blood glucose values, HgbA1C. Participant verbalizes understanding of the signs/symptoms of hyper/hypo glycemia, proper foot care and importance of medication and nutrition plan for blood glucose control.   Intervention Provide nutrition & aerobic exercise along with prescribed medications to achieve blood glucose in normal ranges: Fasting 65-99 mg/dL   Hypertension Yes   Goal Participant will see blood pressure controlled within the values of 140/16m/Hg or within value directed by their physician.   Intervention Provide nutrition & aerobic exercise along with prescribed medications to achieve BP 140/90 or less.   Lipids Yes   Goal Cholesterol controlled with medications as prescribed, with individualized exercise RX and with personalized nutrition plan. Value goals: LDL < 754m HDL > 408mParticipant states understanding of desired cholesterol values and following prescriptions.   Intervention Provide nutrition & aerobic exercise along with prescribed medications to achieve LDL <53m32mDL >40mg60mStress Yes   Goal To meet with psychosocial counselor for stress and relaxation information and guidance. To state understanding of performing relaxation techniques and or identifying  personal stressors.   Intervention Provide education on types of stress, identifiying stressors, and ways to cope with stress. Provide demonstration and active practice of relaxation techniques.      Personal Goals and Risk Factors Review:      Goals and Risk Factor Review      10/12/14 1758 10/13/14 1635 10/13/14 1652       Weight Management   Goals Progress/Improvement seen No       Comments Bryden has gained 5 lbs since he was here in April. Appt made with dietician for tomorrow. 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        Increase Aerobic Exercise and Physical Activity   Goals Progress/Improvement seen   (p) Yes Yes     Comments   Worked out on Treadmill today. Margaret's goal weight is 200lbs     Diabetes   Goal   --  Diet controlled diabetes and currently eats regular ice cream but met with dieticiian today. 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      Hypertension   Goal Participant will see blood pressure controlled within the values of 140/90mm/65mr within value directed by their physician.  Zackari's blood pressure is much better controlled than in April. Today 150/85.       Stress   Goal To meet with psychosocial counselor for stress and relaxation information and guidance. To state understanding of performing relaxation techniques and or identifying personal stressors.  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   --  Stress at work at times. Is trying to attend Cardiac rehab as much as possible.        Personal Goals Discharge (Final Personal Goals and Risk Factors Review):      Goals and Risk Factor Review - 10/13/14 1652    Increase Aerobic Exercise and Physical Activity   Goals Progress/Improvement seen  Yes   Comments Worked out on Treadmill today. Dhillon's goal weight is 200lbs   Diabetes   Goal --  Diet controlled diabetes  and currently eats regular ice cream but met with dieticiian today. 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    Stress  Goal --  Stress at work at times. Is trying to attend Cardiac rehab as much as possible.       Comments: 30 day review. Continue with ITP. Has been absent since end of September

## 2014-11-23 ENCOUNTER — Other Ambulatory Visit: Payer: Self-pay | Admitting: *Deleted

## 2014-11-23 DIAGNOSIS — I5022 Chronic systolic (congestive) heart failure: Secondary | ICD-10-CM

## 2014-12-05 ENCOUNTER — Encounter: Payer: Self-pay | Admitting: Dietician

## 2014-12-05 NOTE — Progress Notes (Signed)
Entered Rate Your Plate pre-score.

## 2014-12-07 ENCOUNTER — Encounter: Payer: Self-pay | Admitting: *Deleted

## 2014-12-07 DIAGNOSIS — I5022 Chronic systolic (congestive) heart failure: Secondary | ICD-10-CM

## 2014-12-07 NOTE — Progress Notes (Signed)
Cardiac Individual Treatment Plan  Patient Details  Name: Bradley Mckenzie. MRN: [id number] Date of Birth: 24-Dec-1965 Referring Provider:  Arnoldo Hooker, M.D.   Initial Encounter Date:  4.14.2016  Visit Diagnosis: Chronic systolic heart failure (HCC)  Patient's Home Medications on Admission:  Current outpatient prescriptions:  .  albuterol (PROVENTIL HFA;VENTOLIN HFA) 108 (90 BASE) MCG/ACT inhaler, Inhale 2 puffs into the lungs every 6 (six) hours as needed for wheezing or shortness of breath., Disp: , Rfl:  .  aspirin 81 MG tablet, Take 81 mg by mouth daily., Disp: , Rfl:  .  azithromycin (ZITHROMAX Z-PAK) 250 MG tablet, Take 2 tablets on day 1 and then 1 tablet daily for the next 4 days, Disp: 6 each, Rfl: 0 .  beclomethasone (QVAR) 40 MCG/ACT inhaler, Inhale 2 puffs into the lungs daily., Disp: , Rfl:  .  carvedilol (COREG) 6.25 MG tablet, Take 1 tablet (6.25 mg total) by mouth 2 (two) times daily with a meal., Disp: 60 tablet, Rfl: 5 .  isosorbide-hydrALAZINE (BIDIL) 20-37.5 MG per tablet, Take 1 tablet by mouth 3 (three) times daily., Disp: 90 tablet, Rfl: 3 .  lisinopril (PRINIVIL,ZESTRIL) 20 MG tablet, Take 1 tablet (20 mg total) by mouth daily., Disp: 90 tablet, Rfl: 3 .  potassium chloride (K-DUR,KLOR-CON) 10 MEQ tablet, Take 1 tablet (10 mEq total) by mouth 2 (two) times daily., Disp: 60 tablet, Rfl: 3 .  simvastatin (ZOCOR) 40 MG tablet, Take 1 tablet (40 mg total) by mouth daily., Disp: 30 tablet, Rfl: 3 .  torsemide (DEMADEX) 20 MG tablet, Take 2 tablets (40 mg total) by mouth daily. Resume torsemide 20 mg from wednesday July 27th thru sun july 31st and then take 40 mg daily thereafter, Disp: 60 tablet, Rfl: 3  Past Medical History: Past Medical History  Diagnosis Date  . Hypertension   . CHF (congestive heart failure) (HCC)   . Diabetes mellitus without complication (HCC)   . Obstructive sleep apnea   . Depression     Tobacco Use: History  Smoking status  . Former  Smoker  Smokeless tobacco  . 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 Participant demonstrates ability to understand and report RPE using BORG scale, to self-measure pulse accurately, and to acknowledge the importance of the exercise prescription.  Exercise Prescription Goal: Starting with aerobic activity 30 plus minutes a day, 3 days per week for initial exercise prescription. Provide home exercise prescription and guidelines that participant acknowledges understanding prior to discharge.  Activity Barriers & Risk Stratification:   6 Minute Walk:   Initial Exercise Prescription:     Initial Exercise Prescription - 10/07/14 1236    Date of Initial Exercise Prescription   Date 04/28/14   Treadmill   MPH 2   Grade 0   Minutes 15  NuStep   Level 3   Watts 40   Minutes 15   Prescription Details   Frequency (times per week) 3   Intensity   THRR REST +  20   Ratings of Perceived Exertion 11-13   Resistance Training   Training Prescription Yes   Weight 3   Reps 10-12      Exercise Prescription Changes:     Exercise Prescription Changes      10/03/14 1800 10/12/14 1700 10/19/14 0800 11/14/14 1100     Exercise Review   Progression   Yes No  Absent since last review; last visit 10/13/14    Response to Exercise   Blood Pressure (Admit) 180/104 mmHg  Initial BP was 200/110, but was brought down with rest --  Initial BP was 200/110, but was brought down with rest 162/88 mmHg     Blood  Pressure (Exercise) 196/94 mmHg  BP on TM was 240/110. Patient was moved to NS with no arms. --  BP on TM was 240/110. Patient was moved to NS with no arms. 132/80 mmHg     Blood Pressure (Exit) 196/106 mmHg  158/98 mmHg     Heart Rate (Admit)   108 bpm     Heart Rate (Exercise)   124 bpm     Heart Rate (Exit)   104 bpm     Rating of Perceived Exertion (Exercise)   12     Symptoms   High blood pressure upon arrival; back pain when walking on the treadmill     Comments   Talked with Samil about back pain on the treadmill. Slower speeds seem to help him, he said the pain varies by the day. Each class we will check in with him on how his back feels and proceed accordingly with exercise mode. No progression will made until he returns     Duration   Progress to 30 minutes of continuous aerobic without signs/symptoms of physical distress Progress to 30 minutes of continuous aerobic without signs/symptoms of physical distress    Intensity   Other (comment)  REST + 20 Other (comment)  REST + 20    Progression   Continue progressive overload as per policy without signs/symptoms or physical distress. Continue progressive overload as per policy without signs/symptoms or physical distress.    Resistance Training   Training Prescription  Yes Yes Yes    Weight  _0 Reps   10-15 10-15    Interval Training   Interval Training   No No    Treadmill   MPH   2.2 2.2    Grade   0 0    Minutes   15 15    NuStep   Level  3  T5 Nustep 5  T5 NS 5  T5 NS    Watts  25 50 50    Minutes   20 20       Discharge Exercise Prescription (Final Exercise Prescription Changes):     Exercise Prescription Changes - 11/14/14 1100    Exercise Review   Progression No  Absent since last review; last visit 10/13/14   Response to Exercise   Comments No progression will made until he returns    Duration Progress to 30 minutes of continuous aerobic without signs/symptoms of physical distress   Intensity Other  (comment)  REST + 20   Progression Continue progressive overload as per policy without signs/symptoms or physical distress.   Resistance Training  Training Prescription Yes   Weight 8   Reps 10-15   Interval Training   Interval Training No   Treadmill   MPH 2.2   Grade 0   Minutes 15   NuStep   Level 5  T5 NS   Watts 50   Minutes 20      Nutrition:  Target Goals: Understanding of nutrition guidelines, daily intake of sodium '1500mg'$ , cholesterol '200mg'$ , calories 30% from fat and 7% or less from saturated fats, daily to have 5 or more servings of fruits and vegetables.  Biometrics:    Nutrition Therapy Plan and Nutrition Goals:     Nutrition Therapy & Goals - 10/13/14 1634    Nutrition Therapy   Diet 1800 kcal DASH   Drug/Food Interactions Statins/Certain Fruits   Fiber 30 grams   Whole Grain Foods 3 servings   Protein 8 ounces/day   Saturated Fats 14 max. grams   Fruits and Vegetables 5 servings/day   Personal Nutrition Goals   Personal Goal #1 Make step-by-step changes to increase fruits, vegetables, and whole grain foods   Personal Goal #2 Work to limit processed meats and sweets (small portions, lower fat or sugar varieties)      Nutrition Discharge: Rate Your Plate Scores:     Rate Your Plate - 87/86/76 7209    Rate Your Plate Scores   Pre Score 50   Pre Score % 56 %      Nutrition Goals Re-Evaluation:     Nutrition Goals Re-Evaluation      12/07/14 1430           Personal Goal #1 Re-Evaluation   Personal Goal #1 We made multiple phone calls and left vm for [place]. He was probably not to answer           Psychosocial: Target Goals: Acknowledge presence or absence of depression, maximize coping skills, provide positive support system. Participant is able to verbalize types and ability to use techniques and skills needed for reducing stress and depression.  Initial Review & Psychosocial Screening:     Initial Psych Review & Screening -  10/12/14 1800    Initial Review   Current issues with Current Stress Concerns   Source of Stress Concerns Occupation   [place]? Yes   Barriers   Psychosocial barriers to participate in program The patient should benefit from training in stress management and relaxation.   Screening Interventions   Interventions Program counselor consult;Encouraged to exercise  [name] is going to try to exercise here alot. 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       Quality of Life Scores:   PHQ-9:     Recent Review Flowsheet Data    Depression screen [facility] 2/9 11/11/2014 10/12/2014 10/12/2014 09/12/2014 08/12/2014   Decreased Interest 0 1 - 0 0   Down, Depressed, Hopeless 0 0 0 0 0   PHQ - 2 Score 0 1 0 0 0   Altered sleeping - 1 - - -   Tired, decreased energy - 1 - - -   Feeling bad or failure about yourself  - 0 - - -   Trouble concentrating - 0 - - -   Moving slowly or fidgety/restless - 0 - - -   Suicidal thoughts - 0 - - -   PHQ-9 Score - 3 - - -  Psychosocial Evaluation and Intervention:     Psychosocial Evaluation - 10/03/14 1737    Psychosocial Evaluation & Interventions   Interventions Encouraged to exercise with the program and follow exercise prescription   Comments Counselor met with Mr. Oleson today for initial psychosocial evaluation.  He is a 49 year old who has been diagnosed with Congestive Heart Failure.  he also has other health issues including Asthma, Bronchitis and Sleep Apnea.  Mr. Bass reports he has a good support system with his significant other and a brother who lives close by.  He states he is not sleeping well and is scheduled for a follow-up sleep study soon to address this.  His appetite is good and he denies a history of depression or anxiety or current symptoms.  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  His goals are to lost weight, get stronger and decrease the number of medications he is taking while in this program.  Counselor will follow up with Mr. Darene Lamer re: his sleep study and recommendations.     Continued Psychosocial Services Needed Yes  Mr. Capozzoli will benefit from the psychoeducational components of this program, especially stress management since he admits his job is very stressful.  He will also benefit from meeting with the dietician to address his weight loss goals.       Psychosocial Re-Evaluation:     Psychosocial Re-Evaluation      12/07/14 1443           Psychosocial Re-Evaluation   Interventions Encouraged to attend Cardiac Rehabilitation for the exercise       Comments Stress of work alot-unable to attend Cardiac Rehab.           Vocational Rehabilitation: Provide vocational rehab assistance to qualifying candidates.   Vocational Rehab Evaluation & Intervention:     Vocational Rehab - 10/12/14 1755    Initial Vocational Rehab Evaluation & Intervention   Assessment shows need for Vocational Rehabilitation No      Education: Education Goals: Education classes will be provided on a weekly basis, covering required topics. Participant will state understanding/return demonstration of topics presented.  Learning Barriers/Preferences:     Learning Barriers/Preferences - 10/12/14 1755    Learning Barriers/Preferences   Learning Barriers None   Learning Preferences None      Education Topics: General Nutrition Guidelines/Fats and Fiber: -Group instruction provided by verbal, written material, models and posters to present the general guidelines for heart healthy nutrition. Gives an explanation and review of dietary fats and fiber.   Controlling Sodium/Reading Food Labels: -Group verbal and written material supporting the discussion of sodium use in heart healthy nutrition. Review and explanation with models, verbal and  written materials for utilization of the food label.   Exercise Physiology & Risk Factors: - Group verbal and written instruction with models to review the exercise physiology of the cardiovascular system and associated critical values. Details cardiovascular disease risk factors and the goals associated with each risk factor.   Aerobic Exercise & Resistance Training: - Gives group verbal and written discussion on the health impact of inactivity. On the components of aerobic and resistive training programs and the benefits of this training and how to safely progress through these programs.   Flexibility, Balance, General Exercise Guidelines: - Provides group verbal and written instruction on the benefits of flexibility and balance training programs. Provides general exercise guidelines with specific guidelines to those with  heart or lung disease. Demonstration and skill practice provided.          Cardiac Rehab from 10/06/2014 in [facility] Cardiac Rehab   Date  10/03/14   Educator  SW   Instruction Review Code  2- meets goals/outcomes      Stress Management: - Provides group verbal and written instruction about the health risks of elevated stress, cause of high stress, and healthy ways to reduce stress.   Depression: - Provides group verbal and written instruction on the correlation between heart/lung disease and depressed mood, treatment options, and the stigmas associated with seeking treatment.   Anatomy & Physiology of the Heart: - Group verbal and written instruction and models provide basic cardiac anatomy and physiology, with the coronary electrical and arterial systems. Review of: AMI, Angina, Valve disease, Heart Failure, Cardiac Arrhythmia, Pacemakers, and the ICD.   Cardiac Procedures: - Group verbal and written instruction and models to describe the testing methods done to diagnose heart disease. Reviews the outcomes of the test results. Describes the treatment choices:  Medical Management, Angioplasty, or Coronary Bypass Surgery.   Cardiac Medications: - Group verbal and written instruction to review commonly prescribed medications for heart disease. Reviews the medication, class of the drug, and side effects. Includes the steps to properly store meds and maintain the prescription regimen.   Go Sex-Intimacy & Heart Disease, Get SMART - Goal Setting: - Group verbal and written instruction through game format to discuss heart disease and the return to sexual intimacy. Provides group verbal and written material to discuss and apply goal setting through the application of the S.M.A.R.T. Method.   Other Matters of the Heart: - Provides group verbal, written materials and models to describe Heart Failure, Angina, Valve Disease, and Diabetes in the realm of heart disease. Includes description of the disease process and treatment options available to the cardiac patient.   Exercise & Equipment Safety: - Individual verbal instruction and demonstration of equipment use and safety with use of the equipment.      Cardiac Rehab from 10/12/2014 in [facility] Cardiac Rehab   Date  10/12/14   Educator  C. Kelsey Durflinger,RN   Instruction Review Code  1- partially meets, needs review/practice      Infection Prevention: - Provides verbal and written material to individual with discussion of infection control including proper hand washing and proper equipment cleaning during exercise session.      Cardiac Rehab from 10/06/2014 in [facility] Cardiac Rehab   Date  04/28/14   Educator  C. Woodlyn   Instruction Review Code  1- partially meets, needs review/practice      Falls Prevention: - Provides verbal and written material to individual with discussion of falls prevention and safety.      Cardiac Rehab from 10/06/2014 in [facility] Cardiac Rehab   Date  04/28/14   Educator  C. Caley Ciaramitaro,RN   Instruction Review Code  1- partially meets, needs review/practice      Diabetes: - Individual  verbal and written instruction to review signs/symptoms of diabetes, desired ranges of glucose level fasting, after meals and with exercise. Advice that pre and post exercise glucose checks will be done for 3 sessions at entry of program.      Cardiac Rehab from 10/06/2014 in [facility] Cardiac Rehab   Date  04/28/14   Educator  C. EnterkinRN   Instruction Review Code  1- partially meets, needs review/practice       Knowledge Questionnaire Score:   Personal Goals and Risk Factors  at Admission:     Personal Goals and Risk Factors at Admission - 10/07/14 1252    Personal Goals and Risk Factors on Admission    Weight Management Yes   Intervention Learn and follow the exercise and diet guidelines while in the program. Utilize the nutrition and education classes to help gain knowledge of the diet and exercise expectations in the program   Increase Aerobic Exercise and Physical Activity Yes   Intervention While in program, learn and follow the exercise prescription taught. Start at a low level workload and increase workload after able to maintain previous level for 30 minutes. Increase time before increasing intensity.   Diabetes Yes   Goal Blood glucose control identified by blood glucose values, HgbA1C. Participant verbalizes understanding of the signs/symptoms of hyper/hypo glycemia, proper foot care and importance of medication and nutrition plan for blood glucose control.   Intervention Provide nutrition & aerobic exercise along with prescribed medications to achieve blood glucose in normal ranges: Fasting 65-99 mg/dL   Hypertension Yes   Goal Participant will see blood pressure controlled within the values of 140/34mm/Hg or within value directed by their physician.   Intervention Provide nutrition & aerobic exercise along with prescribed medications to achieve BP 140/90 or less.   Lipids Yes   Goal Cholesterol controlled with medications as prescribed, with individualized exercise RX and with  personalized nutrition plan. Value goals: LDL < $Rem'70mg'kKJB$ , HDL > $Rem'40mg'kdwb$ . Participant states understanding of desired cholesterol values and following prescriptions.   Intervention Provide nutrition & aerobic exercise along with prescribed medications to achieve LDL '70mg'$ , HDL >$Remo'40mg'hvMHl$ .   Stress Yes   Goal To meet with psychosocial counselor for stress and relaxation information and guidance. To state understanding of performing relaxation techniques and or identifying personal stressors.   Intervention Provide education on types of stress, identifiying stressors, and ways to cope with stress. Provide demonstration and active practice of relaxation techniques.      Personal Goals and Risk Factors Review:      Goals and Risk Factor Review      10/12/14 1758 10/13/14 1635 10/13/14 1652 12/07/14 1441     Weight Management   Goals Progress/Improvement seen No   No    Comments Hajime has gained 5 lbs since he was here in April. Appt made with dietician for tomorrow. 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    Unable to attend Cardiac Rehab due to work.    Increase Aerobic Exercise and Physical Activity   Goals Progress/Improvement seen   (p) Yes Yes No    Comments   Worked out on Treadmill today. Jeris's goal weight is 200lbs Unable to attend Cardiac REhab due to work. We have left multiple vm for him.     Diabetes   Goal   --  Diet controlled diabetes and currently eats regular ice cream but met with dieticiian today. 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      Hypertension   Goal Participant will see blood pressure controlled within the values of 140/65mm/Hg or within value directed by their physician.  Brittney's blood pressure is much better controlled than in April. Today 150/85.   --  Blood pressure was high last time and Dewain is to follow up with his primary care MD>     Abnormal Lipids   Goal    --  To follow up with is primary care MD>     Stress   Goal To meet with psychosocial counselor for stress and relaxation information and guidance. To state understanding of performing relaxation techniques and or identifying personal stressors.  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   --  Stress at work at times. Is trying to attend Cardiac rehab as much as possible. --  STress of work-unable to attend Cardiac REhab.        Personal Goals Discharge (Final Personal Goals and Risk Factors Review):      Goals and Risk Factor Review - 12/07/14 1441    Weight Management   Goals Progress/Improvement seen No   Comments Unable to attend Cardiac Rehab due to work.   Increase Aerobic Exercise and Physical Activity   Goals Progress/Improvement seen  No   Comments Unable to attend Cardiac REhab due to work. We have left multiple vm for him.    Hypertension   Goal --  Blood pressure was high last time and Beckhem is to follow up with his primary care MD>    Abnormal Lipids   Goal --  To follow up with is primary care MD>    Stress   Goal --  STress of work-unable to attend Cardiac REhab.       ITP Comments:   Comments: Stress of work alot-unable to attend Cardiac Rehab.

## 2014-12-07 NOTE — Progress Notes (Signed)
Discharge Summary  Patient Details  Name: Bradley Mckenzie. MRN: [id number] Date of Birth: 03/14/1965 Referring Provider:  Serafina Royals, MD   Number of Visits: 6/36 sessions only Reason for Discharge:  Early Exit:  Personal  Smoking History:  History  Smoking status  . Former Smoker  Smokeless tobacco  . Never Used    Diagnosis:  Chronic systolic heart failure ([name])  ADL UCSD:   Initial Exercise Prescription:     Initial Exercise Prescription - 10/07/14 1236    Date of Initial Exercise Prescription   Date 04/28/14   Treadmill   MPH 2   Grade 0   Minutes 15   NuStep   Level 3   Watts 40   Minutes 15   Prescription Details   Frequency (times per week) 3   Intensity   THRR REST +  20   Ratings of Perceived Exertion 11-13   Resistance Training   Training Prescription Yes   Weight 3   Reps 10-12      Discharge Exercise Prescription (Final Exercise Prescription Changes):     Exercise Prescription Changes - 11/14/14 1100    Exercise Review   Progression No  Absent since last review; last visit 10/13/14   Response to Exercise   Comments No progression will made until he returns    Duration Progress to 30 minutes of continuous aerobic without signs/symptoms of physical distress   Intensity Other (comment)  REST + 20   Progression Continue progressive overload as per policy without signs/symptoms or physical distress.   Resistance Training   Training Prescription Yes   Weight 8   Reps 10-15   Interval Training   Interval Training No   Treadmill   MPH 2.2   Grade 0   Minutes 15   NuStep   Level 5  T5 NS   Watts 50   Minutes 20      Functional Capacity:   Psychological, QOL, Others - Outcomes: PHQ 2/9: Depression screen [facility] 2/9 11/11/2014 10/12/2014 10/12/2014 09/12/2014 08/12/2014  Decreased Interest 0 1 - 0 0  Down, Depressed, Hopeless 0 0 0 0 0  PHQ - 2 Score 0 1 0 0 0  Altered sleeping - 1 - - -  Tired, decreased energy - 1 - - -   Feeling bad or failure about yourself  - 0 - - -  Trouble concentrating - 0 - - -  Moving slowly or fidgety/restless - 0 - - -  Suicidal thoughts - 0 - - -  PHQ-9 Score - 3 - - -    Quality of Life:   Personal Goals: Goals established at orientation with interventions provided to work toward goal.     Personal Goals and Risk Factors at Admission - 10/07/14 1252    Personal Goals and Risk Factors on Admission    Weight Management Yes   Intervention Learn and follow the exercise and diet guidelines while in the program. Utilize the nutrition and education classes to help gain knowledge of the diet and exercise expectations in the program   Increase Aerobic Exercise and Physical Activity Yes   Intervention While in program, learn and follow the exercise prescription taught. Start at a low level workload and increase workload after able to maintain previous level for 30 minutes. Increase time before increasing intensity.   Diabetes Yes   Goal Blood glucose control identified by blood glucose values, HgbA1C. Participant verbalizes understanding of the signs/symptoms of hyper/hypo glycemia, proper foot care and  importance of medication and nutrition plan for blood glucose control.   Intervention Provide nutrition & aerobic exercise along with prescribed medications to achieve blood glucose in normal ranges: Fasting 65-99 mg/dL   Hypertension Yes   Goal Participant will see blood pressure controlled within the values of 140/32mm/Hg or within value directed by their physician.   Intervention Provide nutrition & aerobic exercise along with prescribed medications to achieve BP 140/90 or less.   Lipids Yes   Goal Cholesterol controlled with medications as prescribed, with individualized exercise RX and with personalized nutrition plan. Value goals: LDL < $Rem'70mg'cCUZ$ , HDL > $Rem'40mg'QEIc$ . Participant states understanding of desired cholesterol values and following prescriptions.   Intervention Provide nutrition &  aerobic exercise along with prescribed medications to achieve LDL '70mg'$ , HDL >$Remo'40mg'pkmlT$ .   Stress Yes   Goal To meet with psychosocial counselor for stress and relaxation information and guidance. To state understanding of performing relaxation techniques and or identifying personal stressors.   Intervention Provide education on types of stress, identifiying stressors, and ways to cope with stress. Provide demonstration and active practice of relaxation techniques.       Personal Goals Discharge:     Goals and Risk Factor Review      10/12/14 1758 10/13/14 1635 10/13/14 1652 12/07/14 1441     Weight Management   Goals Progress/Improvement seen No   No    Comments Bradley Mckenzie has gained 5 lbs since he was here in April. Appt made with dietician for tomorrow. 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    Unable to attend Cardiac Rehab due to work.    Increase Aerobic Exercise and Physical Activity   Goals Progress/Improvement seen   (p) Yes Yes No    Comments   Worked out on Treadmill today. Bradley Mckenzie's goal weight is 200lbs Unable to attend Cardiac REhab due to work. We have left multiple vm for him.     Diabetes   Goal   --  Diet controlled diabetes and currently eats regular ice cream but met with dieticiian today. 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      Hypertension   Goal Participant will see blood pressure controlled within the values of 140/49mm/Hg or within value directed by their physician.  Bradley Mckenzie's blood pressure is much better controlled than in April. Today 150/85.   --  Blood pressure was high last time and Bradley Mckenzie is to follow up with his primary care MD>     Abnormal Lipids   Goal    --  To follow up with is primary care MD>     Stress   Goal To meet with psychosocial counselor for stress and relaxation information and guidance. To state understanding of performing relaxation techniques  and or identifying personal stressors.  Bradley Mckenzie has stress from working alot at a Group home where his girlfriend works.   --  Stress at work at times. Is trying to attend Cardiac rehab as much as possible. --  STress of work-unable to attend Cardiac REhab.        Nutrition & Weight - Outcomes:    Nutrition:     Nutrition Therapy & Goals - 10/13/14 1634    Nutrition Therapy   Diet 1800 kcal DASH   Drug/Food Interactions Statins/Certain Fruits   Fiber 30 grams   Whole Grain Foods 3 servings   Protein 8  ounces/day   Saturated Fats 14 max. grams   Fruits and Vegetables 5 servings/day   Personal Nutrition Goals   Personal Goal #1 Make step-by-step changes to increase fruits, vegetables, and whole grain foods   Personal Goal #2 Work to limit processed meats and sweets (small portions, lower fat or sugar varieties)      Nutrition Discharge:     Rate Your Plate - 87/86/76 7209    Rate Your Plate Scores   Pre Score 50   Pre Score % 56 %      Education Questionnaire Score:   Goals reviewed with patient; copy given to patient.

## 2014-12-15 ENCOUNTER — Encounter: Payer: Self-pay | Admitting: Emergency Medicine

## 2014-12-15 ENCOUNTER — Emergency Department: Payer: No Typology Code available for payment source

## 2014-12-15 ENCOUNTER — Emergency Department
Admission: EM | Admit: 2014-12-15 | Discharge: 2014-12-15 | Disposition: A | Discharge status: Home / Self Care | Payer: No Typology Code available for payment source | Attending: Emergency Medicine | Admitting: Emergency Medicine

## 2014-12-15 DIAGNOSIS — R079 Chest pain, unspecified: Secondary | ICD-10-CM | POA: Diagnosis present

## 2014-12-15 DIAGNOSIS — Z79899 Other long term (current) drug therapy: Secondary | ICD-10-CM | POA: Insufficient documentation

## 2014-12-15 DIAGNOSIS — IMO0001 Reserved for inherently not codable concepts without codable children: Secondary | ICD-10-CM

## 2014-12-15 DIAGNOSIS — Z7982 Long term (current) use of aspirin: Secondary | ICD-10-CM | POA: Insufficient documentation

## 2014-12-15 DIAGNOSIS — Z87891 Personal history of nicotine dependence: Secondary | ICD-10-CM | POA: Insufficient documentation

## 2014-12-15 DIAGNOSIS — Z7951 Long term (current) use of inhaled steroids: Secondary | ICD-10-CM | POA: Diagnosis not present

## 2014-12-15 DIAGNOSIS — J45901 Unspecified asthma with (acute) exacerbation: Secondary | ICD-10-CM | POA: Diagnosis not present

## 2014-12-15 DIAGNOSIS — E119 Type 2 diabetes mellitus without complications: Secondary | ICD-10-CM | POA: Insufficient documentation

## 2014-12-15 DIAGNOSIS — K219 Gastro-esophageal reflux disease without esophagitis: Secondary | ICD-10-CM | POA: Insufficient documentation

## 2014-12-15 DIAGNOSIS — I10 Essential (primary) hypertension: Secondary | ICD-10-CM | POA: Insufficient documentation

## 2014-12-15 LAB — BASIC METABOLIC PANEL
Anion gap: 7 (ref 5–15)
BUN: 18 mg/dL (ref 6–20)
CALCIUM: 9 mg/dL (ref 8.9–10.3)
CO2: 28 mmol/L (ref 22–32)
Chloride: 102 mmol/L (ref 101–111)
Creatinine, Ser: 1.12 mg/dL (ref 0.61–1.24)
GFR calc Af Amer: >60 mL/min (ref >60)
GFR calc non Af Amer: >60 mL/min (ref >60)
GLUCOSE: 189 mg/dL — AB (ref 65–99)
Potassium: 3.2 mmol/L — ABNORMAL LOW (ref 3.5–5.1)
Sodium: 137 mmol/L (ref 135–145)

## 2014-12-15 LAB — CBC
HCT: 40.2 % (ref 40.0–52.0)
Hemoglobin: 12.9 g/dL — ABNORMAL LOW (ref 13.0–18.0)
MCH: 26.8 pg (ref 26.0–34.0)
MCHC: 32 g/dL (ref 32.0–36.0)
MCV: 83.8 fL (ref 80.0–100.0)
Platelets: 266 10*3/uL (ref 150–440)
RBC: 4.8 MIL/uL (ref 4.40–5.90)
RDW: 15.7 % — AB (ref 11.5–14.5)
WBC: 10 10*3/uL (ref 3.8–10.6)

## 2014-12-15 LAB — TROPONIN I: Troponin I: 0.03 ng/mL (ref <0.031)

## 2014-12-15 MED ORDER — PANTOPRAZOLE SODIUM 40 MG PO TBEC
40.0000 mg | DELAYED_RELEASE_TABLET | Freq: Once | ORAL | Status: AC
  Administered 2014-12-15: 40 mg via ORAL
  Filled 2014-12-15: qty 1

## 2014-12-15 MED ORDER — FAMOTIDINE 20 MG PO TABS: 20.0000 mg | ORAL_TABLET | Freq: Two times a day (BID) | ORAL | Status: DC | PRN

## 2014-12-15 MED ORDER — FAMOTIDINE 20 MG PO TABS
20.0000 mg | ORAL_TABLET | Freq: Once | ORAL | Status: AC
  Administered 2014-12-15: 20 mg via ORAL
  Filled 2014-12-15: qty 1

## 2014-12-15 MED ORDER — ESOMEPRAZOLE MAGNESIUM 40 MG PO CPDR: 40.0000 mg | DELAYED_RELEASE_CAPSULE | Freq: Every day | ORAL | Status: DC

## 2014-12-15 MED ORDER — PREDNISONE 20 MG PO TABS: ORAL_TABLET | ORAL | Status: AC

## 2014-12-15 MED ORDER — PREDNISONE 20 MG PO TABS
60.0000 mg | ORAL_TABLET | Freq: Once | ORAL | Status: AC
  Administered 2014-12-15: 60 mg via ORAL
  Filled 2014-12-15: qty 3

## 2014-12-15 NOTE — Discharge Instructions (Signed)
Stay hydrated.   Take prednisone as prescribed.  Use your albuterol pump every 4 hrs as needed.   Take nexium daily.  Take pepcid as needed for indigestion.   See your doctor.  Return to ER if you have trouble breathing, chest pain, shortness of breath, fevers.

## 2014-12-15 NOTE — ED Provider Notes (Signed)
CSN: [id number]     Arrival date & time 12/15/14  1027 History   First MD Initiated Contact with Patient 12/15/14 1331     Chief Complaint  Patient presents with  . Chest Pain     (Consider location/radiation/quality/duration/timing/severity/associated sxs/prior Treatment) The history is provided by the patient.  Bradley Mckenzie. is a 49 y.o. male hx of HTN, DM, sleep apnea, asthma here with cough, chest pain. Patient states that he's been having some cold symptoms and cough yesterday. States that he has worsening chest pain when he coughs. Also some nausea but no vomiting. Denies any abdominal pain. Denies any fever. States that this is similar to his previous asthma exacerbation.     Past Medical History  Diagnosis Date  . Hypertension   . CHF (congestive heart failure) ([place])   . Diabetes mellitus without complication ([place])   . Obstructive sleep apnea   . Depression    Past Surgical History  Procedure Laterality Date  . Hernia repair  2004   Family History  Problem Relation Age of Onset  . Heart disease Mother   . Heart attack Father 73  . Diabetes Father   . Cancer Father   . Cancer Sister    Social History  Substance Use Topics  . Smoking status: Former Research scientist (life sciences)  . Smokeless tobacco: Never Used  . Alcohol Use: No     Comment: occassional alcohol use at special events    Review of Systems  Cardiovascular: Positive for chest pain.  All other systems reviewed and are negative.     Allergies  Review of patient's allergies indicates no known allergies.  Home Medications   Prior to Admission medications   Medication Sig Start Date End Date Taking? Authorizing Provider  albuterol (PROVENTIL HFA;VENTOLIN HFA) 108 (90 BASE) MCG/ACT inhaler Inhale 2 puffs into the lungs every 6 (six) hours as needed for wheezing or shortness of breath.    Historical Provider, MD  aspirin 81 MG tablet Take 81 mg by mouth daily.    Historical Provider, MD  azithromycin (ZITHROMAX  Z-PAK) 250 MG tablet Take 2 tablets on day 1 and then 1 tablet daily for the next 4 days 11/11/14   Alisa Graff, FNP  beclomethasone (QVAR) 40 MCG/ACT inhaler Inhale 2 puffs into the lungs daily.    Historical Provider, MD  carvedilol (COREG) 6.25 MG tablet Take 1 tablet (6.25 mg total) by mouth 2 (two) times daily with a meal. 07/29/14   Alisa Graff, FNP  isosorbide-hydrALAZINE (BIDIL) 20-37.5 MG per tablet Take 1 tablet by mouth 3 (three) times daily. 07/29/14   Alisa Graff, FNP  lisinopril (PRINIVIL,ZESTRIL) 20 MG tablet Take 1 tablet (20 mg total) by mouth daily. 09/13/14   Alisa Graff, FNP  potassium chloride (K-DUR,KLOR-CON) 10 MEQ tablet Take 1 tablet (10 mEq total) by mouth 2 (two) times daily. 07/29/14   Alisa Graff, FNP  simvastatin (ZOCOR) 40 MG tablet Take 1 tablet (40 mg total) by mouth daily. 07/29/14   Alisa Graff, FNP  torsemide (DEMADEX) 20 MG tablet Take 2 tablets (40 mg total) by mouth daily. Resume torsemide 20 mg from wednesday July 27th thru sun july 31st and then take 40 mg daily thereafter 08/08/14   Fritzi Mandes, MD   BP 157/89 mmHg  Pulse 85  Temp(Src) 97.5 F (36.4 C) (Oral)  Resp 20  Ht 5\' 9"  (1.753 m)  Wt 290 lb (131.543 kg)  BMI 42.81 kg/m2  SpO2 96%  Physical Exam  Constitutional: He is oriented to person, place, and time. He appears well-developed and well-nourished.  HENT:  Head: Normocephalic.  Mouth/Throat: Oropharynx is clear and moist.  Eyes: Conjunctivae are normal. Pupils are equal, round, and reactive to light.  Neck: Normal range of motion. Neck supple.  Cardiovascular: Normal rate, regular rhythm and normal heart sounds.   Pulmonary/Chest: Effort normal.  No reproducible tenderness. Mild wheezing throughout. Not in distress   Abdominal: Soft. Bowel sounds are normal. He exhibits no distension. There is no tenderness. There is no rebound.  Musculoskeletal: Normal range of motion. He exhibits no edema or tenderness.  Neurological: He is  alert and oriented to person, place, and time. No cranial nerve deficit. Coordination normal.  Skin: Skin is warm and dry.  Psychiatric: He has a normal mood and affect. His behavior is normal. Judgment and thought content normal.  Nursing note and vitals reviewed.   ED Course  Procedures (including critical care time) Labs Review Labs Reviewed  BASIC METABOLIC PANEL - Abnormal; Notable for the following:    Potassium 3.2 (*)    Glucose, Bld 189 (*)    All other components within normal limits  CBC - Abnormal; Notable for the following:    Hemoglobin 12.9 (*)    RDW 15.7 (*)    All other components within normal limits  TROPONIN I    Imaging Review Dg Chest 2 View  12/15/2014  CLINICAL DATA:  Chest pain EXAM: CHEST  2 VIEW COMPARISON:  08/03/2014 FINDINGS: The heart size and mediastinal contours are within normal limits. Both lungs are clear. The visualized skeletal structures are unremarkable. IMPRESSION: No active cardiopulmonary disease. Electronically Signed   By: Franchot Gallo M.D.   On: 12/15/2014 11:18   I have personally reviewed and evaluated these images and lab results as part of my medical decision-making.   EKG Interpretation None       ED ECG REPORT I, YAO, DAVID, the attending physician, personally viewed and interpreted this ECG.   Date: 12/15/2014  EKG Time: 10:38 am  Rate: 89  Rhythm: normal EKG, normal sinus rhythm, nonspecific ST and T waves changes  Axis: L axis  Intervals:none  ST&T Change: TWI laterally, unchanged    MDM   Final diagnoses:  None   Bradley Mckenzie. is a 49 y.o. male here with chest pain, cough since yesterday. Likely asthma vs pneumonia vs reflux. Pain for 24 hrs so trop x 1 sufficient. I doubt PE.   2:12 PM Initial BP was 191/81 but patient didn't take his BP meds. Repeat was 157/89. Trop neg. CXR unremarkable. Likely asthma vs reflux. Will dc home with steroids, nexium, pepcid. Has albuterol pump at home.     Wandra Arthurs, MD 12/15/14 [phone]

## 2014-12-15 NOTE — ED Notes (Signed)
Patient presents to the ED with chest pain that started yesterday.  Patient has cold symptoms and states chest pain is constant but worse with movement and cough.  Patient denies shortness of breath, dizziness, or diaphoresis coinciding with chest pain.  No obvious distress at this time.

## 2014-12-15 NOTE — ED Notes (Signed)
Chest pain x1day,ambulatory  No distress, skin warm and dry

## 2015-01-13 ENCOUNTER — Ambulatory Visit: Payer: No Typology Code available for payment source | Admitting: Family

## 2015-01-20 ENCOUNTER — Encounter: Payer: Self-pay | Admitting: Family

## 2015-01-20 ENCOUNTER — Ambulatory Visit: Payer: No Typology Code available for payment source | Attending: Family | Admitting: Family

## 2015-01-20 VITALS — BP 180/98 | HR 97 | Resp 20 | Ht 69.0 in | Wt 301.0 lb

## 2015-01-20 DIAGNOSIS — Z87891 Personal history of nicotine dependence: Secondary | ICD-10-CM | POA: Insufficient documentation

## 2015-01-20 DIAGNOSIS — Z7982 Long term (current) use of aspirin: Secondary | ICD-10-CM | POA: Insufficient documentation

## 2015-01-20 DIAGNOSIS — Z79899 Other long term (current) drug therapy: Secondary | ICD-10-CM | POA: Diagnosis not present

## 2015-01-20 DIAGNOSIS — G4733 Obstructive sleep apnea (adult) (pediatric): Secondary | ICD-10-CM | POA: Insufficient documentation

## 2015-01-20 DIAGNOSIS — I10 Essential (primary) hypertension: Secondary | ICD-10-CM

## 2015-01-20 DIAGNOSIS — R0602 Shortness of breath: Secondary | ICD-10-CM | POA: Diagnosis present

## 2015-01-20 DIAGNOSIS — I5023 Acute on chronic systolic (congestive) heart failure: Secondary | ICD-10-CM | POA: Insufficient documentation

## 2015-01-20 DIAGNOSIS — I110 Hypertensive heart disease with heart failure: Secondary | ICD-10-CM | POA: Insufficient documentation

## 2015-01-20 DIAGNOSIS — F329 Major depressive disorder, single episode, unspecified: Secondary | ICD-10-CM | POA: Insufficient documentation

## 2015-01-20 DIAGNOSIS — E119 Type 2 diabetes mellitus without complications: Secondary | ICD-10-CM | POA: Diagnosis not present

## 2015-01-20 DIAGNOSIS — R000 Tachycardia, unspecified: Secondary | ICD-10-CM | POA: Insufficient documentation

## 2015-01-20 MED ORDER — ISOSORB DINITRATE-HYDRALAZINE 20-37.5 MG PO TABS: 1.0000 | ORAL_TABLET | Freq: Three times a day (TID) | ORAL | Status: DC

## 2015-01-20 NOTE — Patient Instructions (Addendum)
Continue weighing daily and call for an overnight weight gain of > 2 pounds or a weekly weight gain of >5 pounds.  Resume Bidil as 1 tablet three times daily. Prescription sent to [facility].  Increase torsemide to 2 tablets twice a day for the next 3 days. Also increase potassium to 2 tablets twice daily for the next 3 days.

## 2015-01-20 NOTE — Progress Notes (Signed)
Subjective:    Patient ID: Bradley Pitts., male    DOB: 1965/04/21, 50 y.o.   MRN: [id number]  Congestive Heart Failure Presents for follow-up visit. The disease course has been worsening. Associated symptoms include edema, fatigue and shortness of breath. Pertinent negatives include no abdominal pain, chest pain, orthopnea or palpitations. The symptoms have been worsening. Past treatments include ACE inhibitors, beta blockers and salt and fluid restriction. The treatment provided mild relief. Compliance with prior treatments has been good. Prior compliance problems include medication issues. His past medical history is significant for DM and HTN. There is no history of CVA. He has multiple 1st degree relatives with heart disease. Compliance with total regimen is 76-100%. Compliance problems include medication cost.   Hypertension This is a chronic problem. The current episode started more than 1 year ago. The problem has been waxing and waning since onset. Associated symptoms include headaches (couple of headaches), malaise/fatigue, peripheral edema and shortness of breath. Pertinent negatives include no chest pain or palpitations. There are no associated agents to hypertension. Risk factors for coronary artery disease include diabetes mellitus, male gender, obesity and stress. Past treatments include ACE inhibitors, angiotensin blockers, beta blockers, diuretics and lifestyle changes. The current treatment provides mild improvement. Compliance problems include exercise, diet and medication cost.  Hypertensive end-organ damage includes heart failure. There is no history of CVA.  Other This is a chronic (edema) problem. The current episode started more than 1 year ago. The problem has been gradually worsening. Associated symptoms include fatigue and headaches (couple of headaches). Pertinent negatives include no abdominal pain, chest pain, congestion, coughing, numbness, sore throat or weakness. The  symptoms are aggravated by standing and exertion. He has tried position changes for the symptoms. The treatment provided mild relief.    Past Medical History  Diagnosis Date  . Hypertension   . CHF (congestive heart failure) ([place])   . Diabetes mellitus without complication ([place])   . Obstructive sleep apnea   . Depression     Past Surgical History  Procedure Laterality Date  . Hernia repair  2004    Family History  Problem Relation Age of Onset  . Heart disease Mother   . Heart attack Father 73  . Diabetes Father   . Cancer Father   . Cancer Sister     Social History  Substance Use Topics  . Smoking status: Former Research scientist (life sciences)  . Smokeless tobacco: Never Used  . Alcohol Use: No     Comment: occassional alcohol use at special events    No Known Allergies  Prior to Admission medications   Medication Sig Start Date End Date Taking? Authorizing Provider  albuterol (PROVENTIL HFA;VENTOLIN HFA) 108 (90 BASE) MCG/ACT inhaler Inhale 2 puffs into the lungs every 6 (six) hours as needed for wheezing or shortness of breath.   Yes Historical Provider, MD  aspirin 81 MG tablet Take 81 mg by mouth daily.   Yes Historical Provider, MD  beclomethasone (QVAR) 40 MCG/ACT inhaler Inhale 2 puffs into the lungs daily.   Yes Historical Provider, MD  carvedilol (COREG) 6.25 MG tablet Take 1 tablet (6.25 mg total) by mouth 2 (two) times daily with a meal. 07/29/14  Yes Alisa Graff, FNP  famotidine (PEPCID) 20 MG tablet Take 1 tablet (20 mg total) by mouth 2 (two) times daily as needed for heartburn or indigestion. 12/15/14 12/15/15 Yes Wandra Arthurs, MD  lisinopril (PRINIVIL,ZESTRIL) 20 MG tablet Take 1 tablet (20 mg total) by mouth  daily. 09/13/14  Yes Alisa Graff, FNP  potassium chloride (K-DUR,KLOR-CON) 10 MEQ tablet Take 1 tablet (10 mEq total) by mouth 2 (two) times daily. 07/29/14  Yes Alisa Graff, FNP  simvastatin (ZOCOR) 40 MG tablet Take 1 tablet (40 mg total) by mouth daily. 07/29/14  Yes  Alisa Graff, FNP  torsemide (DEMADEX) 20 MG tablet Take 2 tablets (40 mg total) by mouth daily. Resume torsemide 20 mg from wednesday July 27th thru sun july 31st and then take 40 mg daily thereafter 08/08/14  Yes Fritzi Mandes, MD  esomeprazole (NEXIUM) 40 MG capsule Take 1 capsule (40 mg total) by mouth daily. 12/15/14 12/15/15  Wandra Arthurs, MD  isosorbide-hydrALAZINE (BIDIL) 20-37.5 MG tablet Take 1 tablet by mouth 3 (three) times daily. 01/20/15   Alisa Graff, FNP     Review of Systems  Constitutional: Positive for malaise/fatigue and fatigue. Negative for appetite change.  HENT: Negative for congestion, postnasal drip and sore throat.   Eyes: Negative.   Respiratory: Positive for shortness of breath. Negative for cough and chest tightness.   Cardiovascular: Positive for leg swelling. Negative for chest pain and palpitations.  Gastrointestinal: Positive for abdominal distention. Negative for abdominal pain.  Endocrine: Negative.   Genitourinary: Negative.   Musculoskeletal: Positive for neck stiffness (at times). Negative for back pain.  Skin: Negative.   Allergic/Immunologic: Negative.   Neurological: Positive for headaches (couple of headaches). Negative for dizziness, weakness, light-headedness and numbness.  Hematological: Negative for adenopathy. Does not bruise/bleed easily.  Psychiatric/Behavioral: Positive for sleep disturbance (sleeping on 1 pillow in the recliner). Negative for dysphoric mood. The patient is not nervous/anxious.        Objective:   Physical Exam  Constitutional: He is oriented to person, place, and time. He appears well-developed and well-nourished.  HENT:  Head: Normocephalic and atraumatic.  Eyes: Conjunctivae are normal. Pupils are equal, round, and reactive to light.  Neck: Normal range of motion. Neck supple.  Cardiovascular: Regular rhythm.  Tachycardia present.   Pulmonary/Chest: Effort normal. He has no wheezes. He has no rales.  Abdominal: He  exhibits distension. There is no tenderness.  Musculoskeletal: He exhibits edema (2+ pitting edema in bilateral lower legs). He exhibits no tenderness.  Neurological: He is alert and oriented to person, place, and time.  Skin: Skin is warm and dry.  Psychiatric: He has a normal mood and affect. His behavior is normal. Thought content normal.  Nursing note and vitals reviewed.   BP 180/98 mmHg  Pulse 97  Resp 20  Ht 5\' 9"  (1.753 m)  Wt 301 lb (136.533 kg)  BMI 44.43 kg/m2  SpO2 98%       Assessment & Plan:  1: Acute on chronic heart failure with reduced ejection fraction- Patient presents with weight gain, worsening edema and shortness of breath. Also complains of feeling very tired with little exertion. He says that walking into the office today caused him to get short of breath but once he sat down for a few minutes, his breathing improved. He continues to weigh himself and has noticed a gradual weight gain. By our scale, he has gained 5 pounds since October 2016. Abdomen is tight and pitting edema in bilateral lower legs are present. Patient was instructed to double his torsemide to 40mg  twice daily along with doubling his potassium to 59meq twice daily for the next three days and then he can resume his regular dose of both. Instructed him to not drink more than  40-50 ounces of fluid daily. He is not adding salt to his food and tries to choose low sodium options but admits that he doesn't always make good choices. Need to increase his carvedilol at his next office visit and may need corlanor as well for his tachycardia. Patient has been out of bidil for the last 2 weeks due to finances so 24 tablets were given to him today of bidil 20/37.5mg  to take 1 tablet three times daily (Lot [id number] P2/ EXP 2/18) along with sending a prescription to [place] where he can get it cheaper through the [employer]. 2: HTN- Blood pressure elevated here but, again, he's been out of bidil and  he's got fluid overload. Treating per above. 3: Obstructive sleep apnea- Patient is unable to afford the $130 required by the company to start with the CPAP supplies. Most likely this is playing a great role in his HTN, fatigue and heart failure symptoms. Will contact the foundation to see if any assistance can be provided.  Will have patient return here in 1 week for a recheck along with lab work. Hasn't seen his PCP or cardiologist in quite awhile so will this office call to set up appointments with both. Patient is agreeable to this.

## 2015-01-27 ENCOUNTER — Ambulatory Visit: Payer: No Typology Code available for payment source | Admitting: Family

## 2015-01-30 ENCOUNTER — Ambulatory Visit: Payer: No Typology Code available for payment source | Attending: Family | Admitting: Family

## 2015-01-30 ENCOUNTER — Encounter: Payer: Self-pay | Admitting: Family

## 2015-01-30 VITALS — BP 170/78 | HR 96 | Resp 18 | Ht 69.0 in | Wt 301.0 lb

## 2015-01-30 DIAGNOSIS — R000 Tachycardia, unspecified: Secondary | ICD-10-CM | POA: Diagnosis not present

## 2015-01-30 DIAGNOSIS — I110 Hypertensive heart disease with heart failure: Secondary | ICD-10-CM | POA: Diagnosis not present

## 2015-01-30 DIAGNOSIS — F329 Major depressive disorder, single episode, unspecified: Secondary | ICD-10-CM | POA: Diagnosis not present

## 2015-01-30 DIAGNOSIS — G4733 Obstructive sleep apnea (adult) (pediatric): Secondary | ICD-10-CM | POA: Diagnosis not present

## 2015-01-30 DIAGNOSIS — E119 Type 2 diabetes mellitus without complications: Secondary | ICD-10-CM | POA: Diagnosis not present

## 2015-01-30 DIAGNOSIS — R140 Abdominal distension (gaseous): Secondary | ICD-10-CM | POA: Diagnosis not present

## 2015-01-30 DIAGNOSIS — R0602 Shortness of breath: Secondary | ICD-10-CM | POA: Diagnosis not present

## 2015-01-30 DIAGNOSIS — Z9889 Other specified postprocedural states: Secondary | ICD-10-CM | POA: Insufficient documentation

## 2015-01-30 DIAGNOSIS — Z87891 Personal history of nicotine dependence: Secondary | ICD-10-CM | POA: Diagnosis not present

## 2015-01-30 DIAGNOSIS — Z79899 Other long term (current) drug therapy: Secondary | ICD-10-CM | POA: Insufficient documentation

## 2015-01-30 DIAGNOSIS — Z7982 Long term (current) use of aspirin: Secondary | ICD-10-CM | POA: Insufficient documentation

## 2015-01-30 DIAGNOSIS — I10 Essential (primary) hypertension: Secondary | ICD-10-CM

## 2015-01-30 DIAGNOSIS — I5023 Acute on chronic systolic (congestive) heart failure: Secondary | ICD-10-CM | POA: Diagnosis not present

## 2015-01-30 LAB — BASIC METABOLIC PANEL
Anion gap: 8 (ref 5–15)
BUN: 12 mg/dL (ref 6–20)
CALCIUM: 9.2 mg/dL (ref 8.9–10.3)
CHLORIDE: 103 mmol/L (ref 101–111)
CO2: 29 mmol/L (ref 22–32)
CREATININE: 1.17 mg/dL (ref 0.61–1.24)
GFR calc Af Amer: >60 mL/min (ref >60)
GFR calc non Af Amer: >60 mL/min (ref >60)
GLUCOSE: 242 mg/dL — AB (ref 65–99)
Potassium: 3.6 mmol/L (ref 3.5–5.1)
Sodium: 140 mmol/L (ref 135–145)

## 2015-01-30 MED ORDER — TORSEMIDE 20 MG PO TABS: 40.0000 mg | ORAL_TABLET | Freq: Two times a day (BID) | ORAL | Status: DC

## 2015-01-30 MED ORDER — POTASSIUM CHLORIDE CRYS ER 20 MEQ PO TBCR: 20.0000 meq | EXTENDED_RELEASE_TABLET | Freq: Two times a day (BID) | ORAL | Status: DC

## 2015-01-30 NOTE — Progress Notes (Signed)
Subjective:    Patient ID: Bradley Pitts., male    DOB: 1965/10/11, 50 y.o.   MRN: [id number]  Congestive Heart Failure Presents for follow-up visit. The disease course has been stable. Associated symptoms include edema, fatigue, orthopnea and shortness of breath. Pertinent negatives include no abdominal pain, chest pain, chest pressure, palpitations or paroxysmal nocturnal dyspnea. The symptoms have been stable. Past treatments include ACE inhibitors, beta blockers and salt and fluid restriction. The treatment provided mild relief. Compliance with prior treatments has been good. His past medical history is significant for DM and HTN. There is no history of CVA. He has multiple 1st degree relatives with heart disease. Compliance with total regimen is 76-100%. Compliance problems include adherence to exercise and adherence to diet.   Hypertension This is a chronic problem. The current episode started more than 1 year ago. The problem has been waxing and waning since onset. Associated symptoms include peripheral edema and shortness of breath. Pertinent negatives include no chest pain, headaches, neck pain or palpitations. There are no associated agents to hypertension. Risk factors for coronary artery disease include diabetes mellitus, male gender, obesity and sedentary lifestyle. Past treatments include ACE inhibitors, beta blockers, diuretics and lifestyle changes. The current treatment provides mild improvement. Compliance problems include exercise and diet.  Hypertensive end-organ damage includes heart failure.  Other This is a chronic (edema) problem. The current episode started more than 1 year ago. The problem occurs constantly. The problem has been unchanged. Associated symptoms include congestion, coughing and fatigue. Pertinent negatives include no abdominal pain, chest pain, headaches, joint swelling, neck pain, numbness, sore throat or weakness. The symptoms are aggravated by standing and  walking. He has tried position changes for the symptoms. The treatment provided mild relief.   Past Medical History  Diagnosis Date  . Hypertension   . CHF (congestive heart failure) ([place])   . Diabetes mellitus without complication ([place])   . Obstructive sleep apnea   . Depression     Past Surgical History  Procedure Laterality Date  . Hernia repair  2004    Family History  Problem Relation Age of Onset  . Heart disease Mother   . Heart attack Father 85  . Diabetes Father   . Cancer Father   . Cancer Sister     Social History  Substance Use Topics  . Smoking status: Former Research scientist (life sciences)  . Smokeless tobacco: Never Used  . Alcohol Use: No     Comment: occassional alcohol use at special events    No Known Allergies  Prior to Admission medications   Medication Sig Start Date End Date Taking? Authorizing Provider  albuterol (PROVENTIL HFA;VENTOLIN HFA) 108 (90 BASE) MCG/ACT inhaler Inhale 2 puffs into the lungs every 6 (six) hours as needed for wheezing or shortness of breath.   Yes Historical Provider, MD  aspirin 81 MG tablet Take 81 mg by mouth daily.   Yes Historical Provider, MD  beclomethasone (QVAR) 40 MCG/ACT inhaler Inhale 2 puffs into the lungs daily.   Yes Historical Provider, MD  carvedilol (COREG) 6.25 MG tablet Take 1 tablet (6.25 mg total) by mouth 2 (two) times daily with a meal. 07/29/14  Yes Alisa Graff, FNP  esomeprazole (NEXIUM) 40 MG capsule Take 1 capsule (40 mg total) by mouth daily. 12/15/14 12/15/15 Yes Wandra Arthurs, MD  famotidine (PEPCID) 20 MG tablet Take 1 tablet (20 mg total) by mouth 2 (two) times daily as needed for heartburn or indigestion. 12/15/14 12/15/15 Yes  Wandra Arthurs, MD  isosorbide-hydrALAZINE (BIDIL) 20-37.5 MG tablet Take 1 tablet by mouth 3 (three) times daily. 01/20/15  Yes Alisa Graff, FNP  lisinopril (PRINIVIL,ZESTRIL) 20 MG tablet Take 1 tablet (20 mg total) by mouth daily. 09/13/14  Yes Alisa Graff, FNP  simvastatin (ZOCOR) 40 MG  tablet Take 1 tablet (40 mg total) by mouth daily. 07/29/14  Yes Alisa Graff, FNP  potassium chloride SA (K-DUR,KLOR-CON) 20 MEQ tablet Take 1 tablet (20 mEq total) by mouth 2 (two) times daily. 01/30/15   Alisa Graff, FNP  torsemide (DEMADEX) 20 MG tablet Take 2 tablets (40 mg total) by mouth 2 (two) times daily. 01/30/15   Alisa Graff, FNP      Review of Systems  Constitutional: Positive for fatigue. Negative for appetite change.  HENT: Positive for congestion and postnasal drip. Negative for sore throat.   Eyes: Negative.   Respiratory: Positive for cough and shortness of breath. Negative for chest tightness and wheezing.   Cardiovascular: Positive for leg swelling. Negative for chest pain and palpitations.  Gastrointestinal: Positive for abdominal distention. Negative for abdominal pain.  Endocrine: Negative.   Genitourinary: Negative.   Musculoskeletal: Negative for back pain, joint swelling and neck pain.  Skin: Negative.   Allergic/Immunologic: Negative.   Neurological: Negative for dizziness, weakness, light-headedness, numbness and headaches.  Hematological: Negative for adenopathy. Does not bruise/bleed easily.  Psychiatric/Behavioral: Negative for sleep disturbance (waking up feeling tired. sleeping on 1 pillow and recliner) and dysphoric mood. The patient is not nervous/anxious.        Objective:   Physical Exam  Constitutional: He is oriented to person, place, and time. He appears well-developed and well-nourished.  HENT:  Head: Normocephalic and atraumatic.  Eyes: Conjunctivae are normal. Pupils are equal, round, and reactive to light.  Neck: Normal range of motion. Neck supple.  Cardiovascular: Regular rhythm.  Tachycardia present.   Pulmonary/Chest: Effort normal. He has no wheezes. He has no rales.  Abdominal: He exhibits distension. There is no tenderness.  Musculoskeletal: He exhibits edema (1+ pitting edema in bilateral lower legs). He exhibits no  tenderness.  Neurological: He is alert and oriented to person, place, and time.  Skin: Skin is warm and dry.  Psychiatric: He has a normal mood and affect. His behavior is normal. Thought content normal.  Nursing note and vitals reviewed.   BP 170/78 mmHg  Pulse 96  Resp 18  Ht 5\' 9"  (1.753 m)  Wt 301 lb (136.533 kg)  BMI 44.43 kg/m2  SpO2 96%       Assessment & Plan:  1: Acute on chronic heart failure with reduced ejection fraction- Patient presents with continued shortness of breath, fatigue, abdominal distention and pedal edema. He says that when he took the increase diuretic dosage for a few days, his symptoms improved but as soon as he went back on his original dose, the swelling worsened. He continues to weigh daily and says that his weight is unchanged. By our scale, he weighs the same from his previous visit. 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 He does sleep in a recliner and sometimes in the middle of the night, he'll get up to use the bathroom and then forget to put his legs up. Last time metolazone was used with him, he ended up dehydrated in the ER so he would prefer  to not try that again. Will increase his torsemide to 40mg  twice daily along with potassium 53meq BID. Discussed possibly using aldactone as well. Basic metabolic panel drawn today. May need to recheck it in 2 weeks as well. Sees his cardiologist on 02/03/15. 2: HTN- Blood pressure continues to be elevated. Increasing diuretic per above. May need to increase lisinopril to 40mg  in the future. Sees his PCP early February 2017. 3: Obstructive sleep apnea- This continues to be untreated because he's unable to afford the up front cost. [facility] says that they will cover the upfront cost. Feel like his untreated sleep apnea is playing a huge role in his fluid retention and his HTN. He says that he feels tired all the time  and doesn't feel rested when he wakes up in the mornings.  4: Tachycardia- Heart rate remains in the 90's and would like to increase his carvedilol but don't want to while he's got all this fluid being retained. Plan to try to increase carvedilol and add corlanor if needed.   Return here in 2 weeks or sooner for any questions/problems before then.

## 2015-01-30 NOTE — Patient Instructions (Addendum)
Follow up with:  [facility] Dr. Serafina Royals February 03, 2015 at 2:15 [place] February 22, 2015 at 3:20 pm    Continue weighing daily and call for an overnight weight gain of > 2 pounds or a weekly weight gain of >5 pounds.

## 2015-01-31 ENCOUNTER — Telehealth: Payer: Self-pay | Admitting: Family

## 2015-01-31 NOTE — Telephone Encounter (Signed)
Left message on patient's voicemail (per his request) that his renal function, sodium and potassium levels were all normal. Glucose is elevated and I will send him a copy of his lab work so that he can take the copy to his PCP's appointment early February so this can be addressed. He is to call back for any questions.

## 2015-02-17 ENCOUNTER — Ambulatory Visit: Payer: No Typology Code available for payment source | Admitting: Family

## 2015-02-17 ENCOUNTER — Telehealth: Payer: Self-pay | Admitting: Family

## 2015-02-17 NOTE — Telephone Encounter (Signed)
Patient did not show for his appointment at the [facility] on 02/17/15. Will attempt to reschedule.

## 2015-03-27 ENCOUNTER — Emergency Department: Payer: Self-pay

## 2015-03-27 ENCOUNTER — Inpatient Hospital Stay
Admission: EM | Admit: 2015-03-27 | Discharge: 2015-03-31 | DRG: 292 | Disposition: A | Discharge status: Home / Self Care | Payer: No Typology Code available for payment source | Attending: Internal Medicine | Admitting: Internal Medicine

## 2015-03-27 ENCOUNTER — Encounter: Payer: Self-pay | Admitting: Emergency Medicine

## 2015-03-27 ENCOUNTER — Inpatient Hospital Stay (HOSPITAL_COMMUNITY)
Admit: 2015-03-27 | Discharge: 2015-03-27 | Disposition: A | Discharge status: Home / Self Care | Payer: Self-pay | Attending: Specialist | Admitting: Specialist

## 2015-03-27 DIAGNOSIS — Z9114 Patient's other noncompliance with medication regimen: Secondary | ICD-10-CM

## 2015-03-27 DIAGNOSIS — K219 Gastro-esophageal reflux disease without esophagitis: Secondary | ICD-10-CM | POA: Diagnosis present

## 2015-03-27 DIAGNOSIS — N1831 Chronic kidney disease, stage 3a: Secondary | ICD-10-CM

## 2015-03-27 DIAGNOSIS — I119 Hypertensive heart disease without heart failure: Secondary | ICD-10-CM | POA: Diagnosis present

## 2015-03-27 DIAGNOSIS — E782 Mixed hyperlipidemia: Secondary | ICD-10-CM | POA: Diagnosis present

## 2015-03-27 DIAGNOSIS — I110 Hypertensive heart disease with heart failure: Principal | ICD-10-CM | POA: Diagnosis present

## 2015-03-27 DIAGNOSIS — I43 Cardiomyopathy in diseases classified elsewhere: Secondary | ICD-10-CM

## 2015-03-27 DIAGNOSIS — Z6841 Body Mass Index (BMI) 40.0 and over, adult: Secondary | ICD-10-CM

## 2015-03-27 DIAGNOSIS — G4733 Obstructive sleep apnea (adult) (pediatric): Secondary | ICD-10-CM | POA: Diagnosis present

## 2015-03-27 DIAGNOSIS — Z79899 Other long term (current) drug therapy: Secondary | ICD-10-CM

## 2015-03-27 DIAGNOSIS — E876 Hypokalemia: Secondary | ICD-10-CM | POA: Diagnosis present

## 2015-03-27 DIAGNOSIS — J449 Chronic obstructive pulmonary disease, unspecified: Secondary | ICD-10-CM | POA: Diagnosis present

## 2015-03-27 DIAGNOSIS — I429 Cardiomyopathy, unspecified: Secondary | ICD-10-CM | POA: Diagnosis present

## 2015-03-27 DIAGNOSIS — F329 Major depressive disorder, single episode, unspecified: Secondary | ICD-10-CM | POA: Diagnosis present

## 2015-03-27 DIAGNOSIS — R7989 Other specified abnormal findings of blood chemistry: Secondary | ICD-10-CM | POA: Diagnosis not present

## 2015-03-27 DIAGNOSIS — I5043 Acute on chronic combined systolic (congestive) and diastolic (congestive) heart failure: Secondary | ICD-10-CM

## 2015-03-27 DIAGNOSIS — E0822 Diabetes mellitus due to underlying condition with diabetic chronic kidney disease: Secondary | ICD-10-CM

## 2015-03-27 DIAGNOSIS — I5023 Acute on chronic systolic (congestive) heart failure: Secondary | ICD-10-CM

## 2015-03-27 DIAGNOSIS — I509 Heart failure, unspecified: Secondary | ICD-10-CM

## 2015-03-27 DIAGNOSIS — Z7982 Long term (current) use of aspirin: Secondary | ICD-10-CM

## 2015-03-27 DIAGNOSIS — E785 Hyperlipidemia, unspecified: Secondary | ICD-10-CM | POA: Diagnosis present

## 2015-03-27 DIAGNOSIS — E119 Type 2 diabetes mellitus without complications: Secondary | ICD-10-CM | POA: Diagnosis present

## 2015-03-27 DIAGNOSIS — I5042 Chronic combined systolic (congestive) and diastolic (congestive) heart failure: Secondary | ICD-10-CM | POA: Insufficient documentation

## 2015-03-27 DIAGNOSIS — Z87891 Personal history of nicotine dependence: Secondary | ICD-10-CM

## 2015-03-27 DIAGNOSIS — I248 Other forms of acute ischemic heart disease: Secondary | ICD-10-CM | POA: Diagnosis present

## 2015-03-27 HISTORY — DX: Hypertensive heart disease without heart failure: I11.9

## 2015-03-27 HISTORY — DX: Cardiomyopathy, unspecified: I42.9

## 2015-03-27 HISTORY — DX: Hyperlipidemia, unspecified: E78.5

## 2015-03-27 HISTORY — DX: Chronic combined systolic (congestive) and diastolic (congestive) heart failure: I50.42

## 2015-03-27 HISTORY — DX: Morbid (severe) obesity due to excess calories: E66.01

## 2015-03-27 LAB — CREATININE, SERUM
Creatinine, Ser: 1.1 mg/dL (ref 0.61–1.24)
GFR calc non Af Amer: >60 mL/min (ref >60)

## 2015-03-27 LAB — CBC
HCT: 39.2 % — ABNORMAL LOW (ref 40.0–52.0)
HEMATOCRIT: 38.2 % — AB (ref 40.0–52.0)
HEMOGLOBIN: 12.6 g/dL — AB (ref 13.0–18.0)
Hemoglobin: 12.5 g/dL — ABNORMAL LOW (ref 13.0–18.0)
MCH: 27.2 pg (ref 26.0–34.0)
MCH: 26.8 pg (ref 26.0–34.0)
MCHC: 32.6 g/dL (ref 32.0–36.0)
MCHC: 32.3 g/dL (ref 32.0–36.0)
MCV: 83.4 fL (ref 80.0–100.0)
MCV: 83.1 fL (ref 80.0–100.0)
PLATELETS: 258 10*3/uL (ref 150–440)
PLATELETS: 243 10*3/uL (ref 150–440)
RBC: 4.58 MIL/uL (ref 4.40–5.90)
RBC: 4.71 MIL/uL (ref 4.40–5.90)
RDW: 16.1 % — AB (ref 11.5–14.5)
RDW: 16.3 % — ABNORMAL HIGH (ref 11.5–14.5)
WBC: 8.1 10*3/uL (ref 3.8–10.6)
WBC: 9.4 10*3/uL (ref 3.8–10.6)

## 2015-03-27 LAB — TROPONIN I
TROPONIN I: 0.06 ng/mL — AB (ref <0.031)
TROPONIN I: 0.07 ng/mL — AB (ref <0.031)
TROPONIN I: 0.05 ng/mL — AB (ref <0.031)
Troponin I: 0.05 ng/mL — ABNORMAL HIGH (ref <0.031)

## 2015-03-27 LAB — COMPREHENSIVE METABOLIC PANEL
ALBUMIN: 3.8 g/dL (ref 3.5–5.0)
ALT: 46 U/L (ref 17–63)
ANION GAP: 4 — AB (ref 5–15)
AST: 34 U/L (ref 15–41)
Alkaline Phosphatase: 83 U/L (ref 38–126)
BILIRUBIN TOTAL: 0.5 mg/dL (ref 0.3–1.2)
BUN: 15 mg/dL (ref 6–20)
CALCIUM: 9.2 mg/dL (ref 8.9–10.3)
CO2: 32 mmol/L (ref 22–32)
Chloride: 102 mmol/L (ref 101–111)
Creatinine, Ser: 1.15 mg/dL (ref 0.61–1.24)
GFR calc Af Amer: >60 mL/min (ref >60)
GFR calc non Af Amer: >60 mL/min (ref >60)
Glucose, Bld: 253 mg/dL — ABNORMAL HIGH (ref 65–99)
Potassium: 3.2 mmol/L — ABNORMAL LOW (ref 3.5–5.1)
Sodium: 138 mmol/L (ref 135–145)
TOTAL PROTEIN: 7.7 g/dL (ref 6.5–8.1)

## 2015-03-27 LAB — ECHOCARDIOGRAM COMPLETE
HEIGHTINCHES: 67 in
WEIGHTICAEL: 4763.2 [oz_av]

## 2015-03-27 LAB — BRAIN NATRIURETIC PEPTIDE: B NATRIURETIC PEPTIDE 5: 441 pg/mL — AB (ref 0.0–100.0)

## 2015-03-27 MED ORDER — FUROSEMIDE 10 MG/ML IJ SOLN
40.0000 mg | Freq: Once | INTRAMUSCULAR | Status: AC
  Administered 2015-03-27: 40 mg via INTRAVENOUS
  Filled 2015-03-27: qty 4

## 2015-03-27 MED ORDER — ONDANSETRON HCL 4 MG PO TABS: 4.0000 mg | ORAL_TABLET | Freq: Four times a day (QID) | ORAL | Status: DC | PRN

## 2015-03-27 MED ORDER — ENOXAPARIN SODIUM 40 MG/0.4ML ~~LOC~~ SOLN
40.0000 mg | Freq: Two times a day (BID) | SUBCUTANEOUS | Status: DC
  Administered 2015-03-27 – 2015-03-30 (×6): 40 mg via SUBCUTANEOUS
  Filled 2015-03-27 (×8): qty 0.4

## 2015-03-27 MED ORDER — ACETAMINOPHEN 650 MG RE SUPP: 650.0000 mg | Freq: Four times a day (QID) | RECTAL | Status: DC | PRN

## 2015-03-27 MED ORDER — PANTOPRAZOLE SODIUM 40 MG PO TBEC
40.0000 mg | DELAYED_RELEASE_TABLET | Freq: Every day | ORAL | Status: DC
  Administered 2015-03-27 – 2015-03-31 (×5): 40 mg via ORAL
  Filled 2015-03-27 (×5): qty 1

## 2015-03-27 MED ORDER — ACETAMINOPHEN 325 MG PO TABS
650.0000 mg | ORAL_TABLET | Freq: Four times a day (QID) | ORAL | Status: DC | PRN
  Administered 2015-03-28 (×2): 650 mg via ORAL
  Filled 2015-03-27 (×2): qty 2

## 2015-03-27 MED ORDER — SIMVASTATIN 40 MG PO TABS
40.0000 mg | ORAL_TABLET | Freq: Every day | ORAL | Status: DC
  Administered 2015-03-27 – 2015-03-31 (×5): 40 mg via ORAL
  Filled 2015-03-27 (×5): qty 1

## 2015-03-27 MED ORDER — CARVEDILOL 6.25 MG PO TABS
6.2500 mg | ORAL_TABLET | Freq: Two times a day (BID) | ORAL | Status: DC
  Administered 2015-03-27 – 2015-03-29 (×4): 6.25 mg via ORAL
  Filled 2015-03-27 (×4): qty 1

## 2015-03-27 MED ORDER — POTASSIUM CHLORIDE CRYS ER 20 MEQ PO TBCR
40.0000 meq | EXTENDED_RELEASE_TABLET | Freq: Two times a day (BID) | ORAL | Status: DC
  Administered 2015-03-27 – 2015-03-31 (×8): 40 meq via ORAL
  Filled 2015-03-27 (×8): qty 2

## 2015-03-27 MED ORDER — ISOSORB DINITRATE-HYDRALAZINE 20-37.5 MG PO TABS: 1.0000 | ORAL_TABLET | Freq: Three times a day (TID) | ORAL | Status: DC

## 2015-03-27 MED ORDER — FUROSEMIDE 10 MG/ML IJ SOLN: 40.0000 mg | Freq: Two times a day (BID) | INTRAMUSCULAR | Status: DC

## 2015-03-27 MED ORDER — HYDRALAZINE HCL 25 MG PO TABS
37.5000 mg | ORAL_TABLET | Freq: Three times a day (TID) | ORAL | Status: DC
  Administered 2015-03-27 – 2015-03-31 (×12): 37.5 mg via ORAL
  Filled 2015-03-27 (×12): qty 2

## 2015-03-27 MED ORDER — ALBUTEROL SULFATE (2.5 MG/3ML) 0.083% IN NEBU: 3.0000 mL | INHALATION_SOLUTION | Freq: Four times a day (QID) | RESPIRATORY_TRACT | Status: DC | PRN

## 2015-03-27 MED ORDER — FUROSEMIDE 10 MG/ML IJ SOLN
80.0000 mg | Freq: Two times a day (BID) | INTRAMUSCULAR | Status: DC
  Administered 2015-03-27 – 2015-03-30 (×6): 80 mg via INTRAVENOUS
  Filled 2015-03-27 (×6): qty 8

## 2015-03-27 MED ORDER — ONDANSETRON HCL 4 MG/2ML IJ SOLN: 4.0000 mg | Freq: Four times a day (QID) | INTRAMUSCULAR | Status: DC | PRN

## 2015-03-27 MED ORDER — POTASSIUM CHLORIDE CRYS ER 20 MEQ PO TBCR
20.0000 meq | EXTENDED_RELEASE_TABLET | Freq: Two times a day (BID) | ORAL | Status: DC
  Filled 2015-03-27: qty 1

## 2015-03-27 MED ORDER — LISINOPRIL 20 MG PO TABS
20.0000 mg | ORAL_TABLET | Freq: Every day | ORAL | Status: DC
  Administered 2015-03-27 – 2015-03-31 (×5): 20 mg via ORAL
  Filled 2015-03-27 (×5): qty 1

## 2015-03-27 MED ORDER — ASPIRIN EC 81 MG PO TBEC
81.0000 mg | DELAYED_RELEASE_TABLET | Freq: Every day | ORAL | Status: DC
Start: 2015-03-27 — End: 2015-03-31
  Administered 2015-03-27 – 2015-03-31 (×5): 81 mg via ORAL
  Filled 2015-03-27 (×5): qty 1

## 2015-03-27 MED ORDER — ISOSORBIDE DINITRATE 10 MG PO TABS
20.0000 mg | ORAL_TABLET | Freq: Three times a day (TID) | ORAL | Status: DC
  Administered 2015-03-27 – 2015-03-31 (×12): 20 mg via ORAL
  Filled 2015-03-27 (×12): qty 2

## 2015-03-27 NOTE — Progress Notes (Signed)
Pharmacy Note - Enoxaparin  Patient with orders for enoxaparin 40mg  SQ Q24H for VTE prophylaxis.  Estimated Creatinine Clearance: 104.1 mL/min (by C-G formula based on Cr of 1.15). Body mass index is 46.62 kg/(m^2).  Adjusted to enoxaparin 40mg  SQ Q12H for BMI > [place], PharmD Clinical Pharmacist  03/27/2015 2:13 PM

## 2015-03-27 NOTE — ED Notes (Signed)
Pt presents with mid sternal chest pain and shortness of breath going on for about one week.

## 2015-03-27 NOTE — Progress Notes (Signed)
Room air. NSR. Takes meds ok. Ambulating in the room and tolerated it well. No pain. Pt has no further concerns at this time.

## 2015-03-27 NOTE — Care Management (Signed)
Patient admitted with exac of his CHF.  He is followed by [facility] heart failure clinic.  Notified agency of admission.  Patient is followed by [facility]. He is employed full time and able to drive.  He is without insurance at present.  "They said they were going to change my insurance and I just did not get signed up."  Discussed the need due to health history and patient verbalizes understanding.  He says that he is compliant with his medications and they are on the four dollar list.  He does not comply with daily weight.  He does not know if he has gained weight but has noticed swelling in his lower extremities.  Denies issues accessing medical care and at present no issues obtaining his medications.  CM will assess discharge meds and ability to pay for them.

## 2015-03-27 NOTE — Progress Notes (Signed)
*  PRELIMINARY RESULTS* Echocardiogram 2D Echocardiogram has been performed.  Bradley Mckenzie 03/27/2015, 7:09 PM

## 2015-03-27 NOTE — H&P (Signed)
[facility] [place] NAME: Bradley Mckenzie    MR#:  [id number]  DATE OF BIRTH:  [birth year]  DATE OF ADMISSION:  03/27/2015  PRIMARY CARE PHYSICIAN: Nat Christen, PA-C   REQUESTING/REFERRING PHYSICIAN: Dr. Lavonia Drafts  CHIEF COMPLAINT:   Chief Complaint  Patient presents with  . Chest Pain  . Shortness of Breath    HISTORY OF PRESENT ILLNESS:  Bradley Mckenzie  is a 50 y.o. male with a known history of CHF, cardiomyopathy with reduced ejection fraction, obstructive sleep apnea, hypertension, depression who presents to the hospital due to shortness of breath, lower extremity edema and noted to be in CHF. Patient says that he has been feeling more short of breath on minimal exertion even at rest over the past few days to weeks. He admits to worsening lower extremity edema but does not know how much weight he has gained. He admits to a baseline to 3 pillow orthopnea and sleeps in a recliner. He also admits to some chest pain associated with the shortness of breath, but no nausea, vomiting, palpitations, syncope or diaphoresis. He denies any abdominal pain, diarrhea or any other associated symptoms. Patient presented to the emergency room was noted to be hypertensive with systolic blood pressures greater than 200 and chest x-ray findings suggestive of interstitial edema in CHF and therefore hospitalist services were contacted further treatment and evaluation.  PAST MEDICAL HISTORY:   Past Medical History  Diagnosis Date  . Hypertension   . CHF (congestive heart failure) ([place])   . Diabetes mellitus without complication ([place])   . Obstructive sleep apnea   . Depression     PAST SURGICAL HISTORY:   Past Surgical History  Procedure Laterality Date  . Hernia repair  2004    SOCIAL HISTORY:   Social History  Substance Use Topics  . Smoking status: Former Research scientist (life sciences)  . Smokeless tobacco: Never Used  . Alcohol Use: No     Comment:  occassional alcohol use at special events    FAMILY HISTORY:   Family History  Problem Relation Age of Onset  . Heart disease Mother   . Heart attack Father 40  . Diabetes Father   . Cancer Father   . Cancer Sister     DRUG ALLERGIES:  No Known Allergies  REVIEW OF SYSTEMS:   Review of Systems  Constitutional: Negative for fever and weight loss.  HENT: Negative for congestion, nosebleeds and tinnitus.   Eyes: Negative for blurred vision, double vision and redness.  Respiratory: Positive for shortness of breath. Negative for cough and hemoptysis.   Cardiovascular: Positive for chest pain. Negative for orthopnea, leg swelling and PND.  Gastrointestinal: Negative for nausea, vomiting, abdominal pain, diarrhea and melena.  Genitourinary: Negative for dysuria, urgency and hematuria.  Musculoskeletal: Negative for joint pain and falls.  Neurological: Negative for dizziness, tingling, sensory change, focal weakness, seizures, weakness and headaches.  Endo/Heme/Allergies: Negative for polydipsia. Does not bruise/bleed easily.  Psychiatric/Behavioral: Negative for depression and memory loss. The patient is not nervous/anxious.     MEDICATIONS AT HOME:   Prior to Admission medications   Medication Sig Start Date End Date Taking? Authorizing Provider  albuterol (PROVENTIL HFA;VENTOLIN HFA) 108 (90 BASE) MCG/ACT inhaler Inhale 2 puffs into the lungs every 6 (six) hours as needed for wheezing or shortness of breath.   Yes Historical Provider, MD  aspirin EC 81 MG tablet Take 81 mg by mouth daily.   Yes Historical Provider, MD  carvedilol (COREG) 6.25 MG tablet Take 1 tablet (6.25 mg total) by mouth 2 (two) times daily with a meal. 07/29/14  Yes Alisa Graff, FNP  esomeprazole (NEXIUM) 40 MG capsule Take 1 capsule (40 mg total) by mouth daily. 12/15/14 12/15/15 Yes Wandra Arthurs, MD  famotidine (PEPCID) 20 MG tablet Take 1 tablet (20 mg total) by mouth 2 (two) times daily as needed for  heartburn or indigestion. 12/15/14 12/15/15 Yes Wandra Arthurs, MD  isosorbide-hydrALAZINE (BIDIL) 20-37.5 MG tablet Take 1 tablet by mouth 3 (three) times daily. 01/20/15  Yes Alisa Graff, FNP  lisinopril (PRINIVIL,ZESTRIL) 20 MG tablet Take 1 tablet (20 mg total) by mouth daily. 09/13/14  Yes Alisa Graff, FNP  potassium chloride SA (K-DUR,KLOR-CON) 20 MEQ tablet Take 1 tablet (20 mEq total) by mouth 2 (two) times daily. 01/30/15  Yes Alisa Graff, FNP  simvastatin (ZOCOR) 40 MG tablet Take 1 tablet (40 mg total) by mouth daily. 07/29/14  Yes Alisa Graff, FNP  torsemide (DEMADEX) 20 MG tablet Take 2 tablets (40 mg total) by mouth 2 (two) times daily. 01/30/15  Yes Alisa Graff, FNP      VITAL SIGNS:  Blood pressure 200/93, temperature 97.7 F (36.5 C), temperature source Oral, resp. rate 22, height 5\' 9"  (1.753 m), weight 180.078 kg (397 lb).  PHYSICAL EXAMINATION:  Physical Exam  GENERAL:  50 y.o.-year-old obese patient lying in the bed in no acute distress.  EYES: Pupils equal, round, reactive to light and accommodation. No scleral icterus. Extraocular muscles intact.  HEENT: Head atraumatic, normocephalic. Oropharynx and nasopharynx clear. No oropharyngeal erythema, moist oral mucosa  NECK:  Supple, no jugular venous distention. No thyroid enlargement, no tenderness.  LUNGS: Normal breath sounds bilaterally, no wheezing, rales, rhonchi. No use of accessory muscles of respiration.  CARDIOVASCULAR: S1, S2 RRR. No murmurs, rubs, gallops, clicks.  ABDOMEN: Soft, nontender, nondistended. Bowel sounds present. No organomegaly or mass.  EXTREMITIES: +2 pedal edema b/l , cyanosis, or clubbing. + 2 pedal & radial pulses b/l.   NEUROLOGIC: Cranial nerves II through XII are intact. No focal Motor or sensory deficits appreciated b/l PSYCHIATRIC: The patient is alert and oriented x 3. Good affect.  SKIN: No obvious rash, lesion, or ulcer.   LABORATORY PANEL:   CBC  Recent Labs Lab  03/27/15 1020  WBC 8.1  HGB 12.5*  HCT 38.2*  PLT 258   ------------------------------------------------------------------------------------------------------------------  Chemistries   Recent Labs Lab 03/27/15 1020  NA 138  K 3.2*  CL 102  CO2 32  GLUCOSE 253*  BUN 15  CREATININE 1.15  CALCIUM 9.2  AST 34  ALT 46  ALKPHOS 83  BILITOT 0.5   ------------------------------------------------------------------------------------------------------------------  Cardiac Enzymes  Recent Labs Lab 03/27/15 1020  TROPONINI 0.06*   ------------------------------------------------------------------------------------------------------------------  RADIOLOGY:  Dg Chest 2 View  03/27/2015  CLINICAL DATA:  Cough, congestion, shortness of breath and generalized weakness for 10 days. EXAM: CHEST  2 VIEW COMPARISON:  PA and lateral chest 12/15/2014 and 08/03/2014. FINDINGS: There is cardiomegaly and mild interstitial edema. No pneumothorax or pleural effusion. No focal bony abnormality. IMPRESSION: Cardiomegaly and mild interstitial edema. Electronically Signed   By: Inge Rise M.D.   On: 03/27/2015 10:37     IMPRESSION AND PLAN:   50 year old male with past medical history of cardiomyopathy with reduced ejection fraction, history of CHF, hypertension, depression presents to the hospital due to shortness of breath and noted to be in congestive heart failure.  #  1 CHF-acute on chronic systolic dysfunction. -Diuresis the patient with IV Lasix, follow I's and O's and daily weights. -Continue Coreg, Imdur/hydralazine, lisinopril. -Consult cardiology. Check a two-dimensional echocardiogram.  #2 elevated troponin-demand ischemia secondary to CHF. -Telemetry, cycle cardiac markers. Continue aspirin, statin.  #3 accelerated hypertension-continue Inderal, hydralazine, Coreg -If needed will add as needed hydralazine.  #4 hyperlipidemia-continue simvastatin.  #5 hypokalemia-we'll  place on oral potassium supplements repeat level in the morning.  #6 GERD-continue Protonix.    All the records are reviewed and case discussed with ED provider. Management plans discussed with the patient, family and they are in agreement.  CODE STATUS: Full  TOTAL TIME TAKING CARE OF THIS PATIENT: 45 minutes.    Henreitta Leber M.D on 03/27/2015 at 1:07 PM  Between 7am to 6pm - Pager - [phone]  After 6pm go to [email or web address] - password [email or web address] [facility]  [place] [facility]  Office  [phone]  CC: Primary care physician; Nat Christen, PA-C

## 2015-03-27 NOTE — ED Notes (Signed)
Patient transported to X-ray 

## 2015-03-27 NOTE — ED Notes (Signed)
Dr. Corky Downs notified of troponin 0.06.

## 2015-03-27 NOTE — Consult Note (Signed)
Cardiology Consult    Patient ID: Bradley Mckenzie. MRN: [id number], DOB/AGE: 1965/04/22   Admit date: 03/27/2015 Date of Consult: 03/27/2015  Primary Physician: Bradley Christen, PA-C Primary Cardiologist: Bradley Mckenzie seen by Bradley Reading, MD (01/2014)  - he now wishes to see .  Predominantly followed in CHF clinic by T. Jackelyn Hoehn, NP Requesting Provider: Bobetta Mckenzie  Patient Profile    50 y/o ? with a h/o combined CHF, HTN, HL, obesity, and DM, who was admitted earlier today with progressive dyspnea and edema.  Past Medical History   Past Medical History  Diagnosis Date  . Hypertensive heart disease     a. Since his 5's.  . Chronic combined systolic (congestive) and diastolic (congestive) heart failure ([place])     a. 01/2014 Echo: EF 25-30%.  . Diabetes mellitus without complication ([place])     a. Not previously on outpt meds.  . Obstructive sleep apnea     a. Has not used CPAP since ~ 2012.  . Depression   . Morbid obesity ([place])   . Hyperlipidemia   . Cardiomyopathy ([place])     a. 01/2014 Echo: EF 25-30%;  b. ? ischemic vs non-ischemic.  He's never had an ischemic eval.    Past Surgical History  Procedure Laterality Date  . Hernia repair  2004     Allergies  No Known Allergies  History of Present Illness    50 y/o ? with the above complex PMH.  He has a h/o systolic and diastolic CHF along with undefined cardiomyopathy dating back to 01/2014, when he was admitted with CHF and mild troponin elevation.  Echo showed and EF of 25-30%.  He was eval by Bradley Mckenzie @ the time and did f/u with them one time in 02/2014 but has not f/u with them since.  Instead, he has been followed closely in the [facility].  He says that he has chronic DOE along with chronic lower ext edema and orthopnea.  He has good days and bad days, and his exercise tolerance over the past few years waxes and wanes between bad and worse.  He says that he doesn't add salt to food but that he sometimes eats processed  foods or goes out to eat.  He says he generally can't afford the foods that he knows are good for him.  He also misses doses of his meds on a fairly regular basis.  He weighs himself from time to time and notes that he typically weighs about 297 lbs.  He was 158 lbs when he entered the [employer] @ age 66.  He has an old CPAP machine but hasn't used it since approx 2012.  He was last seen in CHF clinic in 01/2015, @ which time he was felt to be volume overloaded w/ complaints of LEE, orthopnea, and DOE.  His torsemide dose was increased to 40 BID and he was supposed to f/u 2 wks later but never did.  He didn't really notice much difference in his volume status following titration of his torsemide and he was not able to make his 2 wk f/u appt.  Over this past weekend, he noted worsening of exercise tolerance along with dyspnea at rest.  Due to progression of Ss, he presented to the ED this morning.  On arrival, BP was 200/93.  CXR showed cardiomegaly and interstitial edema.  Trop was mildly elevated @ 0.06.  BNP was elevated @ 441.  We've been asked to eval.  He denies chest  pain, palpitations, n, v, dizziness, syncope, or early satiety. He has chronic abd distension and he isn't sure if it's been any worse.  Inpatient Medications    . aspirin EC  81 mg Oral Daily  . carvedilol  6.25 mg Oral BID WC  . enoxaparin (LOVENOX) injection  40 mg Subcutaneous Q12H  . furosemide  40 mg Intravenous Q12H  . isosorbide dinitrate  20 mg Oral TID   And  . hydrALAZINE  37.5 mg Oral TID  . lisinopril  20 mg Oral Daily  . pantoprazole  40 mg Oral Daily  . potassium chloride SA  20 mEq Oral BID  . simvastatin  40 mg Oral Daily    Family History    Family History  Problem Relation Age of Onset  . Heart disease Mother     alive & well.  [name] Heart attack Father 21    died @ 9 of cancer.  . Diabetes Father   . Cancer Father   . Cancer Sister     Social History    Social History   Social History  . Marital  Status: Single    Spouse Name: N/A  . Number of Children: N/A  . Years of Education: N/A   Occupational History  . 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 Smoking status: Former Research scientist (life sciences)  . Smokeless tobacco: Never Used     Comment: Quit in his 20's.  . Alcohol Use: 0.0 oz/week    0 Standard drinks or equivalent per week     Comment: occassional alcohol use at special events  . Drug Use: No     Comment: Used marijuana in his teens.  . Sexual Activity: Not on file   Other Topics Concern  . Not on file   Social History Narrative   L[place] in [place] with his [place].  She runs a group home and they live on the premises.  He works as a Actuary for the cognitively challenged.     Review of Systems    General:  No chills, fever, night sweats or weight changes.  Cardiovascular:  No chest pain, +++ dyspnea on exertion, edema, orthopnea, paroxysmal nocturnal dyspnea.  He has chronic abd bloating/distension.  No palpitations. Dermatological: No rash, lesions/masses Respiratory: No cough, +++ chronic dyspnea Urologic: No hematuria, dysuria Abdominal:   No nausea, vomiting, diarrhea, bright red blood per rectum, melena, or hematemesis Neurologic:  No visual changes, wkns, changes in mental status. All other systems reviewed and are otherwise negative except as noted above.  Physical Exam    Blood pressure 155/98, pulse 92, temperature 97.7 F (36.5 C), temperature source Oral, resp. rate 27, height 5\' 7"  (1.702 m), weight 297 lb 11.2 oz (135.036 kg), SpO2 97 %.  General: Pleasant, NAD Psych: Normal affect. Neuro: Alert and oriented X 3. Moves all extremities spontaneously. HEENT: Normal  Neck: Supple without bruits.  Difficult to gauge JVP 2/2 girth. Lungs:  Resp regular and unlabored, CTA. Heart: RRR, +S3, soft rub heard best @ apex while leaning slightly forward. Abdomen: Firm, protuberant, non-tender, BS + x 4.  Extremities: No clubbing, cyanosis.  1+  bilat LE edema. DP/PT/Radials 2+ and equal bilaterally.  Labs     Recent Labs  03/27/15 1020  TROPONINI 0.06*   Lab Results  Component Value Date   WBC 8.1 03/27/2015   HGB 12.5* 03/27/2015   HCT 38.2* 03/27/2015   MCV 83.4 03/27/2015  PLT 258 03/27/2015     Recent Labs Lab 03/27/15 1020  NA 138  K 3.2*  CL 102  CO2 32  BUN 15  CREATININE 1.15  CALCIUM 9.2  PROT 7.7  BILITOT 0.5  ALKPHOS 83  ALT 46  AST 34  GLUCOSE 253*   Lab Results  Component Value Date   CHOL 161 02/08/2014   HDL 33* 02/08/2014   LDLCALC 112* 02/08/2014   TRIG 79 02/08/2014     Radiology Studies    Dg Chest 2 View  03/27/2015  CLINICAL DATA:  Cough, congestion, shortness of breath and generalized weakness for 10 days. EXAM: CHEST  2 VIEW COMPARISON:  PA and lateral chest 12/15/2014 and 08/03/2014. FINDINGS: There is cardiomegaly and mild interstitial edema. No pneumothorax or pleural effusion. No focal bony abnormality. IMPRESSION: Cardiomegaly and mild interstitial edema. Electronically Signed   By: Inge Rise M.D.   On: 03/27/2015 10:37    ECG & Cardiac Imaging    RSR, 93, LAD, LAFB, LAE, poor R progression, lat TWI. - no acute ST/T changes.  Assessment & Plan    1.  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 Isch vs Nonisch):  Pt with a h/o systolic dysfxn (EF of 123XX123 by echo in 01/2014) presented with acute on chronic DOE, edema, and orthopnea, which worsened over this past weekend  dyspnea @ rest.  He does have a h/o dietary indiscretions and often misses doses of his medications.  He has never undergone an ischemic evaluation.  In ED, he was markedly hypertensive @ 200/93 and CXR showed cardiomegaly and interstitial edema.  BNP mildly elevated @ 441.  BP and symptoms improved after lasix 40 IV in the ED.  Currently no complaints @ rest.  He is significantly volume overloaded and has a rub on exam.  Echo pending.  Normal pulse pressure, no pulsus  paradoxus.  Cont aggressive IV diuresis.  Cont  blocker, acei, bidil.  If renal fxn stable w/ diuresis, would add spironolactone.  He is not sure what his dry weight would be, though he was ~ 280 in 03/2014 and has been running in the 290's since then.  He will need an ischemic evaluation.  This could likely be carried out as an outpt w/ a 2 day nuclear study.    2.  Hypertensive Heart Disease:  Markedly hypertensive on admission in the setting of volume overload and some h/o medication noncompliance - missing doses here and there.  BP improved after one dose of IV lasix.  Cont  blocker, acei, hydral/nitrate.  Consider spiro as above.  3.  HL:  LDL 112 in 01/2014.  He has been on simvastatin.  F/u lipids.  LFT's ok.  4.  Type II DM:  Hyperglycemic on admission.  On no meds @ home.  Per IM.  5.  OSA:  Has not used CPAP in several years - says his machine is too old.  CHF clinic has been trying to get him a new machine and [facility] apparently willing to cover up front cost of CPAP.  Likely has PAH.  6.  Morbid Obesity:  Weighed 158 lbs @ age 33  now 60.  He had previously been enrolled in cardiac rehab.  Would benefit from re-enrollment and aggressive nutritional counseling.  7.  Hypokalemia:  Supplementation ordered.  Will consider adding spiro.  8.  Elevated Troponin:  Likely 2/2 demand ischemia in the setting of CHF.  He denies c/p.  Cont asa,  bb, statin.  Follow trop trend.  RF for CAD include FH, HTN, HL, DM (poorly managed), obesity.  As above, he will need ischemic eval and provided that trop trend is flat, this could likely be performed as an outpt.  Signed, Murray Hodgkins, NP 03/27/2015, 3:03 PM

## 2015-03-27 NOTE — ED Notes (Signed)
Pt ambulated in room without difficulty. SpO2 remained 96-98%.

## 2015-03-27 NOTE — ED Provider Notes (Signed)
[facility] Emergency Department Provider Note  ____________________________________________    I have reviewed the triage vital signs and the nursing notes.   HISTORY  Chief Complaint Chest Pain and Shortness of Breath    HPI [place] Mck[place]. is a 50 y.o. male who presents with complaints of shortness of breath and chest pain. Patient does have a history of CHF and COPD and diabetes and hypertension. He reports he has had steady mild discomfort in the center of his chest and shortness of breath which is worse with any exertion. He denies fevers or chills. No recent travel. He does note edema has worsened in his legs.     Past Medical History  Diagnosis Date  . Hypertension   . CHF (congestive heart failure) ([place])   . Diabetes mellitus without complication ([place])   . Obstructive sleep apnea   . Depression     Patient Active Problem List   Diagnosis Date Noted  . Acute on chronic systolic heart failure ([place]) 01/20/2015  . Sinusitis 11/11/2014  . Tachycardia 09/12/2014  . Acute renal failure ([place]) 08/07/2014  . COPD (chronic obstructive pulmonary disease) ([place]) 08/05/2014  . Chronic systolic heart failure ([place]) 06/16/2014  . Obstructive sleep apnea 06/16/2014  . Essential hypertension 06/16/2014  . H/O respiratory system disease 02/24/2014    Past Surgical History  Procedure Laterality Date  . Hernia repair  2004    Current Outpatient Rx  Name  Route  Sig  Dispense  Refill  . albuterol (PROVENTIL HFA;VENTOLIN HFA) 108 (90 BASE) MCG/ACT inhaler   Inhalation   Inhale 2 puffs into the lungs every 6 (six) hours as needed for wheezing or shortness of breath.         [name] aspirin EC 81 MG tablet   Oral   Take 81 mg by mouth daily.         . carvedilol (COREG) 6.25 MG tablet   Oral   Take 1 tablet (6.25 mg total) by mouth 2 (two) times daily with a meal.   60 tablet   5   . esomeprazole (NEXIUM) 40 MG capsule   Oral   Take 1 capsule (40  mg total) by mouth daily.   30 capsule   0   . famotidine (PEPCID) 20 MG tablet   Oral   Take 1 tablet (20 mg total) by mouth 2 (two) times daily as needed for heartburn or indigestion.   60 tablet   1   . isosorbide-hydrALAZINE (BIDIL) 20-37.5 MG tablet   Oral   Take 1 tablet by mouth 3 (three) times daily.   90 tablet   3   . lisinopril (PRINIVIL,ZESTRIL) 20 MG tablet   Oral   Take 1 tablet (20 mg total) by mouth daily.   90 tablet   3   . potassium chloride SA (K-DUR,KLOR-CON) 20 MEQ tablet   Oral   Take 1 tablet (20 mEq total) by mouth 2 (two) times daily.   90 tablet   3   . simvastatin (ZOCOR) 40 MG tablet   Oral   Take 1 tablet (40 mg total) by mouth daily.   30 tablet   3   . torsemide (DEMADEX) 20 MG tablet   Oral   Take 2 tablets (40 mg total) by mouth 2 (two) times daily.   360 tablet   3     Allergies Review of patient's allergies indicates no known allergies.  Family History  Problem Relation Age of Onset  .  Heart disease Mother   . Heart attack Father 49  . Diabetes Father   . Cancer Father   . Cancer Sister     Social History Social History  Substance Use Topics  . Smoking status: Former Research scientist (life sciences)  . Smokeless tobacco: Never Used  . Alcohol Use: No     Comment: occassional alcohol use at special events    Review of Systems  Constitutional: Negative for fever. Eyes: Negative for redness ENT: Negative for sore throat Cardiovascular: As above Respiratory: Positive for shortness of breath Gastrointestinal: Negative for abdominal pain Genitourinary: Negative for dysuria. Musculoskeletal: Negative for back pain. Skin: Negative for rash. Neurological: Negative for focal weakness Psychiatric: no anxiety    ____________________________________________   PHYSICAL EXAM:  VITAL SIGNS: ED Triage Vitals  Enc Vitals Group     BP 03/27/15 0940 200/93 mmHg     Pulse --      Resp 03/27/15 0940 22     Temp 03/27/15 0940 97.7 F (36.5  C)     Temp Source 03/27/15 0940 Oral     SpO2 --      Weight 03/27/15 0940 397 lb (180.078 kg)     Height 03/27/15 0940 5\' 9"  (1.753 m)     Head Cir --      Peak Flow --      Pain Score 03/27/15 0940 5     Pain Loc --      Pain Edu? --      Excl. in [place]? --      Constitutional: Alert and oriented. Well appearing and in no distress.  Eyes: Conjunctivae are normal. No erythema or injection ENT   Head: Normocephalic and atraumatic.   Mouth/Throat: Mucous membranes are moist. Cardiovascular: Normal rate, regular rhythm. Normal and symmetric distal pulses are present in the upper extremities.   Respiratory: Normal respiratory effort without tachypnea nor retractions. Right lower lobe rales Gastrointestinal: Soft and non-tender in all quadrants. No distention. There is no CVA tenderness. Genitourinary: deferred Musculoskeletal: Nontender with normal range of motion in all extremities. 2+ edema bilaterally calf tenderness Neurologic:  Normal speech and language. No gross focal neurologic deficits are appreciated. Skin:  Skin is warm, dry and intact. No rash noted. Psychiatric: Mood and affect are normal. Patient exhibits appropriate insight and judgment.  ____________________________________________    LABS (pertinent positives/negatives)  Labs Reviewed  CBC - Abnormal; Notable for the following:    Hemoglobin 12.5 (*)    HCT 38.2 (*)    RDW 16.1 (*)    All other components within normal limits  COMPREHENSIVE METABOLIC PANEL - Abnormal; Notable for the following:    Potassium 3.2 (*)    Glucose, Bld 253 (*)    Anion gap 4 (*)    All other components within normal limits  TROPONIN I - Abnormal; Notable for the following:    Troponin I 0.06 (*)    All other components within normal limits  BRAIN NATRIURETIC PEPTIDE - Abnormal; Notable for the following:    B Natriuretic Peptide 441.0 (*)    All other components within normal limits     ____________________________________________   EKG  ED ECG REPORT I, Lavonia Drafts, the attending physician, personally viewed and interpreted this ECG.   Date: 03/27/2015  EKG Time: 9:30 AM  Rate: 93  Rhythm: normal sinus rhythm  Axis: Normal axis  Intervals:left anterior fascicular block  ST&T Change: Nonspecific, T-wave inversion V5   ____________________________________________    RADIOLOGY  Chest x-ray shows  interstitial edema  ____________________________________________   PROCEDURES  Procedure(s) performed: none  Critical Care performed: none  ____________________________________________   INITIAL IMPRESSION / ASSESSMENT AND PLAN / ED COURSE  Pertinent labs & imaging results that were available during my care of the patient were reviewed by me and considered in my medical decision making (see chart for details).  Patient presents with complaints of shortness of breath and chest pain. His multiple comorbidities. He has noticed an increase in his overall weight and he has not been checking it regularly. He reports increased edema in his lower legs. His chest x-ray does show interstitial edema and his BNP is elevated. We will give Lasix IV and admit to the hospital for further management  ____________________________________________   FINAL CLINICAL IMPRESSION(S) / ED DIAGNOSES  Final diagnoses:  Acute on chronic systolic congestive heart failure (HCC)          Lavonia Drafts, MD 03/27/15 1232

## 2015-03-28 DIAGNOSIS — E119 Type 2 diabetes mellitus without complications: Secondary | ICD-10-CM

## 2015-03-28 DIAGNOSIS — G4733 Obstructive sleep apnea (adult) (pediatric): Secondary | ICD-10-CM

## 2015-03-28 DIAGNOSIS — I429 Cardiomyopathy, unspecified: Secondary | ICD-10-CM

## 2015-03-28 DIAGNOSIS — E785 Hyperlipidemia, unspecified: Secondary | ICD-10-CM | POA: Diagnosis not present

## 2015-03-28 DIAGNOSIS — I5043 Acute on chronic combined systolic (congestive) and diastolic (congestive) heart failure: Secondary | ICD-10-CM | POA: Diagnosis not present

## 2015-03-28 LAB — BASIC METABOLIC PANEL
ANION GAP: 8 (ref 5–15)
BUN: 18 mg/dL (ref 6–20)
CALCIUM: 8.9 mg/dL (ref 8.9–10.3)
CO2: 29 mmol/L (ref 22–32)
Chloride: 100 mmol/L — ABNORMAL LOW (ref 101–111)
Creatinine, Ser: 1.32 mg/dL — ABNORMAL HIGH (ref 0.61–1.24)
Glucose, Bld: 147 mg/dL — ABNORMAL HIGH (ref 65–99)
POTASSIUM: 3.4 mmol/L — AB (ref 3.5–5.1)
Sodium: 137 mmol/L (ref 135–145)

## 2015-03-28 LAB — CBC
HEMATOCRIT: 36.1 % — AB (ref 40.0–52.0)
HEMOGLOBIN: 11.7 g/dL — AB (ref 13.0–18.0)
MCH: 26.5 pg (ref 26.0–34.0)
MCHC: 32.3 g/dL (ref 32.0–36.0)
MCV: 82 fL (ref 80.0–100.0)
Platelets: 241 10*3/uL (ref 150–440)
RBC: 4.41 MIL/uL (ref 4.40–5.90)
RDW: 16 % — ABNORMAL HIGH (ref 11.5–14.5)
WBC: 11.2 10*3/uL — ABNORMAL HIGH (ref 3.8–10.6)

## 2015-03-28 LAB — GLUCOSE, CAPILLARY
Glucose-Capillary: 129 mg/dL — ABNORMAL HIGH (ref 65–99)
Glucose-Capillary: 200 mg/dL — ABNORMAL HIGH (ref 65–99)

## 2015-03-28 LAB — LIPID PANEL
CHOL/HDL RATIO: 5.5 ratio
CHOLESTEROL: 170 mg/dL (ref 0–200)
HDL: 31 mg/dL — AB (ref >40)
LDL Cholesterol: 118 mg/dL — ABNORMAL HIGH (ref 0–99)
TRIGLYCERIDES: 103 mg/dL (ref <150)
VLDL: 21 mg/dL (ref 0–40)

## 2015-03-28 LAB — HEMOGLOBIN A1C: Hgb A1c MFr Bld: 7.8 % — ABNORMAL HIGH (ref 4.0–6.0)

## 2015-03-28 MED ORDER — INSULIN ASPART 100 UNIT/ML ~~LOC~~ SOLN
0.0000 [IU] | Freq: Three times a day (TID) | SUBCUTANEOUS | Status: DC
  Administered 2015-03-29: 3 [IU] via SUBCUTANEOUS
  Administered 2015-03-30 (×2): 2 [IU] via SUBCUTANEOUS
  Administered 2015-03-31: 3 [IU] via SUBCUTANEOUS
  Filled 2015-03-28: qty 2
  Filled 2015-03-28: qty 3
  Filled 2015-03-28: qty 2

## 2015-03-28 MED ORDER — INSULIN ASPART 100 UNIT/ML ~~LOC~~ SOLN
0.0000 [IU] | Freq: Three times a day (TID) | SUBCUTANEOUS | Status: DC
  Administered 2015-03-29: 2 [IU] via SUBCUTANEOUS
  Filled 2015-03-28: qty 2
  Filled 2015-03-28: qty 3

## 2015-03-28 NOTE — Progress Notes (Addendum)
Patient: Bradley Mckenzie. / Admit Date: 03/27/2015 / Date of Encounter: 03/28/2015, 8:44 AM   Subjective: Improving SOB, not at baseline, slept ok, Still with significant leg edema, pitting to the knees He is uncertain if he has abdominal bloating or thigh edema or scrotal edema Reports symptoms have been coming on for weeks to months, happen to him in the past He does report some noncompliance with his Lasix at times, difficult work schedule, home life makes it difficult to stay on Lasix on a regular basis Does not check his weight at home on a regular basis   Review of Systems: Review of Systems  Constitutional: Negative.   Respiratory: Positive for shortness of breath.   Cardiovascular: Positive for leg swelling.  Gastrointestinal: Negative.   Musculoskeletal: Negative.   Neurological: Negative.   Psychiatric/Behavioral: Negative.   All other systems reviewed and are negative.   Objective: Telemetry: Normal sinus rhythm Physical Exam: Blood pressure 136/76, pulse 78, temperature 98.1 F (36.7 C), temperature source Oral, resp. rate 27, height 5\' 7"  (1.702 m), weight 297 lb 9.6 oz (134.99 kg), SpO2 92 %. Body mass index is 46.6 kg/(m^2). General: Well developed, well nourished, in no acute distress. Head: Normocephalic, atraumatic, sclera non-icteric, no xanthomas, nares are without discharge. Neck: Negative for carotid bruits. JVP not elevated. Lungs: Clear bilaterally to auscultation without wheezes, rales, or rhonchi. Breathing is unlabored. Heart: RRR S1 S2 without murmurs, rubs, or gallops.  Abdomen: Soft, non-tender, non-distended with normoactive bowel sounds. No rebound/guarding. Extremities: No clubbing or cyanosis. 1+ pitting edema to below the knees. Distal pedal pulses are 2+ and equal bilaterally. Neuro: Alert and oriented X 3. Moves all extremities spontaneously. Psych:  Responds to questions appropriately with a normal affect.   Intake/Output Summary (Last  24 hours) at 03/28/15 0844 Last data filed at 03/28/15 0456  Gross per 24 hour  Intake      0 ml  Output   1400 ml  Net  -1400 ml    Inpatient Medications:  . aspirin EC  81 mg Oral Daily  . carvedilol  6.25 mg Oral BID WC  . enoxaparin (LOVENOX) injection  40 mg Subcutaneous Q12H  . furosemide  80 mg Intravenous BID  . isosorbide dinitrate  20 mg Oral TID   And  . hydrALAZINE  37.5 mg Oral TID  . lisinopril  20 mg Oral Daily  . pantoprazole  40 mg Oral Daily  . potassium chloride SA  40 mEq Oral BID  . simvastatin  40 mg Oral Daily   Infusions:    Labs:  Recent Labs  03/27/15 1020 03/27/15 1405 03/28/15 0401  NA 138  --  137  K 3.2*  --  3.4*  CL 102  --  100*  CO2 32  --  29  GLUCOSE 253*  --  147*  BUN 15  --  18  CREATININE 1.15 1.10 1.32*  CALCIUM 9.2  --  8.9    Recent Labs  03/27/15 1020  AST 34  ALT 46  ALKPHOS 83  BILITOT 0.5  PROT 7.7  ALBUMIN 3.8    Recent Labs  03/27/15 1524 03/28/15 0401  WBC 9.4 11.2*  HGB 12.6* 11.7*  HCT 39.2* 36.1*  MCV 83.1 82.0  PLT 243 241    Recent Labs  03/27/15 1020 03/27/15 1405 03/27/15 1825 03/27/15 2202  TROPONINI 0.06* 0.07* 0.05* 0.05*   Invalid input(s): POCBNP No results for input(s): HGBA1C in the last 72 hours.  Weights: Filed Weights   03/27/15 0940 03/27/15 1329 03/28/15 0456  Weight: 397 lb (180.078 kg) 297 lb 11.2 oz (135.036 kg) 297 lb 9.6 oz (134.99 kg)     Radiology/Studies:  Dg Chest 2 View  03/27/2015  CLINICAL DATA:  Cough, congestion, shortness of breath and generalized weakness for 10 days. EXAM: CHEST  2 VIEW COMPARISON:  PA and lateral chest 12/15/2014 and 08/03/2014. FINDINGS: There is cardiomegaly and mild interstitial edema. No pneumothorax or pleural effusion. No focal bony abnormality. IMPRESSION: Cardiomegaly and mild interstitial edema. Electronically Signed   By: Inge Rise M.D.   On: 03/27/2015 10:37     Assessment and Plan  50 y.o. male  1. Acute on  chronic combined systolic and diastolic CHF/Cardiomyopathy (? Isch vs Nonisch): h/o systolic dysfxn (EF of 123XX123 by echo in 01/2014)  presenting with acute on chronic DOE, edema, and orthopnea,  -hypertensive on arrival  -- Cont ? blocker, acei, bidil. -- was ~ 280 in 03/2014 and has been running in the 290's since then.  He will need an ischemic evaluation. This could likely be carried out as an outpt w/ a 2 day nuclear study.  --would continue lasix 80 mg IV BID for now with close monitoring of renal function and potassium Suspect he will need an additional day or 2 of diuresis given his continued pitting edema  2. Hypertensive Heart Disease:  hypertensive on admission in the setting of volume overload   h/o medication noncompliance  Cont ? blocker, acei, hydral/nitrate.  Blood pressure much improved  3. HL:  LDL 112 in 01/2014.   on simvastatin.  LFT's ok.  4. Type II DM:  Hyperglycemic on admission.  On no meds @ home. Per IM.  5. OSA:  has not used CPAP in several years - says his machine is too old.  CHF clinic has been trying to get him a new machine and [facility] apparently willing to cover up front cost of CPAP.   6. Morbid Obesity:  previously enrolled in cardiac rehab.  Would benefit from re-enrollment and aggressive nutritional counseling.  7. Hypokalemia:  Will replete  8. Elevated Troponin:  Likely 2/2 demand ischemia in the setting of CHF.   he will need ischemic eval  as an outpt   Signed, Esmond Plants, MD, Ph.D. [facility] 03/28/2015, 8:44 AM

## 2015-03-28 NOTE — Progress Notes (Signed)
Inpatient Diabetes Program Recommendations  AACE/ADA: New Consensus Statement on Inpatient Glycemic Control (2015)  Target Ranges:  Prepandial:   less than 140 mg/dL      Peak postprandial:   less than 180 mg/dL (1-2 hours)      Critically ill patients:  140 - 180 mg/dL   Results for PAVAN, STOPKA (MRN [id number]) as of 03/28/2015 09:46  Ref. Range 03/27/2015 10:20 03/28/2015 04:01  Glucose Latest Ref Range: 65-99 mg/dL 253 (H) 147 (H)    Diabetes history: Type 2 diabetes Outpatient Diabetes medications: None Current orders for Inpatient glycemic control: None Inpatient Diabetes Program Recommendations:   Please consider checking blood sugars tid with meals and HS and add Novolog moderate tid with meals and HS.  Also may consider checking A1C to determine pre-hospitalization glycemic control.  Patient may need oral diabetes agent added at discharge.   Thanks, Adah Perl, RN, BC-ADM Inpatient Diabetes Coordinator Pager [phone] (8a-5p)

## 2015-03-28 NOTE — Progress Notes (Signed)
Patient has been alert and oriented. Only complaint has been a slight headache treated with tylenol. Still giving 80mg  of IV lasix bid, 1695mL output since 7AM so far. Patient has been up to the chair all day. Will continue to monitor.

## 2015-03-28 NOTE — Progress Notes (Signed)
Follow-up appointment scheduled at the [facility] on April 12, 2015 at 1:00pm. Of note, he did not show for his last appointment on 02/17/15. Thank you.

## 2015-03-28 NOTE — Progress Notes (Signed)
Pt states that despite being diabetic, that he has never taken his own blood sugar but he "used to take it for his daddy". RN educated patient on the importance of checking blood sugar, and how to use the glucometer. Pt was able to proficiently take his own blood sugar using glucometer with RN.   [place]ck[place]

## 2015-03-28 NOTE — Progress Notes (Signed)
Bradley Mckenzie at [facility]                                                                                                                                                                                            Patient Demographics   Bradley Mckenzie, is a 50 y.o. male, DOB - 06-Aug-1965, [id number]  Admit date - 03/27/2015   Admitting Physician Henreitta Leber, MD  Outpatient Primary MD for the patient is Nat Christen, PA-C   LOS - 1  Subjective: Patient admitted with shortness of breath continues to complain of shortness of breath and lower extremity swelling.     Review of Systems:   CONSTITUTIONAL: No documented fever. No fatigue, weakness. No weight gain, no weight loss.  EYES: No blurry or double vision.  ENT: No tinnitus. No postnasal drip. No redness of the oropharynx.  RESPIRATORY: No cough, no wheeze, no hemoptysis. Positive dyspnea.  CARDIOVASCULAR: Positive chest pain. No orthopnea. No palpitations. No syncope.  GASTROINTESTINAL: No nausea, no vomiting or diarrhea. No abdominal pain. No melena or hematochezia.  GENITOURINARY: No dysuria or hematuria.  ENDOCRINE: No polyuria or nocturia. No heat or cold intolerance.  HEMATOLOGY: No anemia. No bruising. No bleeding.  INTEGUMENTARY: No rashes. No lesions. Complains of swelling of the lower extremity MUSCULOSKELETAL: No arthritis. No swelling. No gout.  NEUROLOGIC: No numbness, tingling, or ataxia. No seizure-type activity.  PSYCHIATRIC: No anxiety. No insomnia. No ADD.    Vitals:   Filed Vitals:   03/27/15 1955 03/27/15 2143 03/28/15 0456 03/28/15 1220  BP: 143/72 143/78 136/76 118/66  Pulse: 90 88 78 85  Temp: 97.5 F (36.4 C)  98.1 F (36.7 C) 97.1 F (36.2 C)  TempSrc: Oral  Oral Axillary  Resp:    18  Height:      Weight:   134.99 kg (297 lb 9.6 oz)   SpO2: 92%  92% 95%    Wt Readings from Last 3 Encounters:  03/28/15 134.99 kg (297 lb 9.6 oz)  01/30/15 136.533 kg  (301 lb)  01/20/15 136.533 kg (301 lb)     Intake/Output Summary (Last 24 hours) at 03/28/15 1321 Last data filed at 03/28/15 1024  Gross per 24 hour  Intake    240 ml  Output   2225 ml  Net  -1985 ml    Physical Exam:   GENERAL: Pleasant-appearing in no apparent distress.  HEAD, EYES, EARS, NOSE AND THROAT: Atraumatic, normocephalic. Extraocular muscles are intact. Pupils equal and reactive to light. Sclerae anicteric. No conjunctival injection. No oro-pharyngeal erythema.  NECK: Supple. There is no jugular venous distention. No  bruits, no lymphadenopathy, no thyromegaly.  HEART: Regular rate and rhythm,. No murmurs, no rubs, no clicks.  LUNGS: Occasional crackles at the bases no rales rhonchi wheezing  ABDOMEN: Soft, flat, nontender, nondistended. Has good bowel sounds. No hepatosplenomegaly appreciated.  EXTREMITIES: No evidence of any cyanosis, clubbing, 1+ peripheral edema.  +2 pedal and radial pulses bilaterally.  NEUROLOGIC: The patient is alert, awake, and oriented x3 with no focal motor or sensory deficits appreciated bilaterally.  SKIN: Moist and warm with no rashes appreciated.  Psych: Not anxious, depressed LN: No inguinal LN enlargement    Antibiotics   Anti-infectives    None      Medications   Scheduled Meds: . aspirin EC  81 mg Oral Daily  . carvedilol  6.25 mg Oral BID WC  . enoxaparin (LOVENOX) injection  40 mg Subcutaneous Q12H  . furosemide  80 mg Intravenous BID  . isosorbide dinitrate  20 mg Oral TID   And  . hydrALAZINE  37.5 mg Oral TID  . lisinopril  20 mg Oral Daily  . pantoprazole  40 mg Oral Daily  . potassium chloride SA  40 mEq Oral BID  . simvastatin  40 mg Oral Daily   Continuous Infusions:  PRN Meds:.acetaminophen **OR** acetaminophen, albuterol, ondansetron **OR** ondansetron (ZOFRAN) IV   Data Review:   Micro Results No results found for this or any previous visit (from the past 240 hour(s)).  Radiology Reports Dg Chest 2  View  03/27/2015  CLINICAL DATA:  Cough, congestion, shortness of breath and generalized weakness for 10 days. EXAM: CHEST  2 VIEW COMPARISON:  PA and lateral chest 12/15/2014 and 08/03/2014. FINDINGS: There is cardiomegaly and mild interstitial edema. No pneumothorax or pleural effusion. No focal bony abnormality. IMPRESSION: Cardiomegaly and mild interstitial edema. Electronically Signed   By: Inge Rise M.D.   On: 03/27/2015 10:37     CBC  Recent Labs Lab 03/27/15 1020 03/27/15 1524 03/28/15 0401  WBC 8.1 9.4 11.2*  HGB 12.5* 12.6* 11.7*  HCT 38.2* 39.2* 36.1*  PLT 258 243 241  MCV 83.4 83.1 82.0  MCH 27.2 26.8 26.5  MCHC 32.6 32.3 32.3  RDW 16.1* 16.3* 16.0*    Chemistries   Recent Labs Lab 03/27/15 1020 03/27/15 1405 03/28/15 0401  NA 138  --  137  K 3.2*  --  3.4*  CL 102  --  100*  CO2 32  --  29  GLUCOSE 253*  --  147*  BUN 15  --  18  CREATININE 1.15 1.10 1.32*  CALCIUM 9.2  --  8.9  AST 34  --   --   ALT 46  --   --   ALKPHOS 83  --   --   BILITOT 0.5  --   --    ------------------------------------------------------------------------------------------------------------------ estimated creatinine clearance is 90.7 mL/min (by C-G formula based on Cr of 1.32). ------------------------------------------------------------------------------------------------------------------ No results for input(s): HGBA1C in the last 72 hours. ------------------------------------------------------------------------------------------------------------------  Recent Labs  03/28/15 0401  CHOL 170  HDL 31*  LDLCALC 118*  TRIG 103  CHOLHDL 5.5   ------------------------------------------------------------------------------------------------------------------ No results for input(s): TSH, T4TOTAL, T3FREE, THYROIDAB in the last 72 hours.  Invalid input(s):  FREET3 ------------------------------------------------------------------------------------------------------------------ No results for input(s): VITAMINB12, FOLATE, FERRITIN, TIBC, IRON, RETICCTPCT in the last 72 hours.  Coagulation profile No results for input(s): INR, PROTIME in the last 168 hours.  No results for input(s): DDIMER in the last 72 hours.  Cardiac Enzymes  Recent Labs Lab 03/27/15  1405 03/27/15 1825 03/27/15 2202  TROPONINI 0.07* 0.05* 0.05*   ------------------------------------------------------------------------------------------------------------------ Invalid input(s): POCBNP    Assessment & Plan   50 year old male with past medical history of cardiomyopathy with reduced ejection fraction, history of CHF, hypertension, depression presents to the hospital due to shortness of breath and noted to be in congestive heart failure. There is some question about his medical compliance  #1 CHF-acute on chronic systolic dysfunction. -Patient continues to complain of shortness of breath and swelling. Continue Lasix current dose. His creatinine is increased we'll need to monitor his renal function. He still appears volume overloaded. - Echo shows systolic dysfunction. Cardiology consult is pending  #2 elevated troponin-demand ischemia secondary to CHF. -Telemetry, cycle cardiac markers. Continue aspirin, statin. - Cardiology consult  #3 accelerated hypertension-continue Inderal, hydralazine, Coreg Blood pressures improved with the current regimen   #4 hyperlipidemia-continue simvastatin. Laboratory evaluation shows that his cholesterol is not completely controlled however I'm not certain if he is compliant with his diet  #5 hypokalemia-will give him potassium check in the morning  #6 GERD-continue Protonix.     Code Status Orders        Start     Ordered   03/27/15 1347  Full code   Continuous     03/27/15 1346    Code Status History    Date Active  Date Inactive Code Status Order ID Comments User Context   08/07/2014  9:00 PM 08/08/2014  6:19 PM Full Code [id number]  Baxter Hire, MD Inpatient           Consults  cardiology  DVT Prophylaxis Lovenox  Lab Results  Component Value Date   PLT 241 03/28/2015     Time Spent in minutes   35 minutes Dustin Flock M.D on 03/28/2015 at 1:21 PM  Between 7am to 6pm - Pager - [phone]  After 6pm go to [email or web address] - password [email or web address] [place] [facility]   Office  [phone]

## 2015-03-29 LAB — GLUCOSE, CAPILLARY
Glucose-Capillary: 183 mg/dL — ABNORMAL HIGH (ref 65–99)
Glucose-Capillary: 238 mg/dL — ABNORMAL HIGH (ref 65–99)
Glucose-Capillary: 137 mg/dL — ABNORMAL HIGH (ref 65–99)
Glucose-Capillary: 210 mg/dL — ABNORMAL HIGH (ref 65–99)

## 2015-03-29 LAB — BASIC METABOLIC PANEL
Anion gap: 6 (ref 5–15)
BUN: 19 mg/dL (ref 6–20)
CALCIUM: 8.9 mg/dL (ref 8.9–10.3)
CHLORIDE: 100 mmol/L — AB (ref 101–111)
CO2: 29 mmol/L (ref 22–32)
Creatinine, Ser: 1.18 mg/dL (ref 0.61–1.24)
GFR calc Af Amer: >60 mL/min (ref >60)
Glucose, Bld: 181 mg/dL — ABNORMAL HIGH (ref 65–99)
POTASSIUM: 3.7 mmol/L (ref 3.5–5.1)
SODIUM: 135 mmol/L (ref 135–145)

## 2015-03-29 LAB — CBC
HCT: 37.9 % — ABNORMAL LOW (ref 40.0–52.0)
HEMOGLOBIN: 12.7 g/dL — AB (ref 13.0–18.0)
MCH: 27.7 pg (ref 26.0–34.0)
MCHC: 33.5 g/dL (ref 32.0–36.0)
MCV: 82.7 fL (ref 80.0–100.0)
Platelets: 245 10*3/uL (ref 150–440)
RBC: 4.58 MIL/uL (ref 4.40–5.90)
RDW: 16 % — ABNORMAL HIGH (ref 11.5–14.5)
WBC: 9.4 10*3/uL (ref 3.8–10.6)

## 2015-03-29 MED ORDER — CARVEDILOL 3.125 MG PO TABS
9.3750 mg | ORAL_TABLET | Freq: Two times a day (BID) | ORAL | Status: DC
  Administered 2015-03-29 – 2015-03-31 (×4): 9.375 mg via ORAL
  Filled 2015-03-29 (×4): qty 1

## 2015-03-29 NOTE — Progress Notes (Signed)
Hospital Problem List     Principal Problem:   Acute on chronic combined systolic (congestive) and diastolic (congestive) heart failure (HCC) Active Problems:   Obstructive sleep apnea   Cardiomyopathy ([place])   Hyperlipidemia   Morbid obesity ([place])   Diabetes mellitus without complication ([place])   Hypertensive heart disease    Patient Profile:   Primary Cardiologist: New to [facility] - Dr. Fletcher Anon  50 y/o male w/ PMH of chronic combined CHF, HTN, HLD, obesity, and DM, who was admitted to [facility] on 03/27/2015 for worsening shortness of breath and lower extremity edema secondary to acute on chronic combined CHF.  Subjective   Denies any chest pain or palpitations. Breathing significantly improved since admission. Net output of -2.5L yesterday.   Inpatient Medications    . aspirin EC  81 mg Oral Daily  . carvedilol  6.25 mg Oral BID WC  . enoxaparin (LOVENOX) injection  40 mg Subcutaneous Q12H  . furosemide  80 mg Intravenous BID  . isosorbide dinitrate  20 mg Oral TID   And  . hydrALAZINE  37.5 mg Oral TID  . insulin aspart  0-9 Units Subcutaneous TID WC  . insulin aspart  0-9 Units Subcutaneous TID WC  . lisinopril  20 mg Oral Daily  . pantoprazole  40 mg Oral Daily  . potassium chloride SA  40 mEq Oral BID  . simvastatin  40 mg Oral Daily    Vital Signs    Filed Vitals:   03/28/15 1704 03/28/15 2029 03/29/15 0533 03/29/15 0715  BP: 150/86 142/76 140/85 150/79  Pulse: 87 80 85 86  Temp:  97.7 F (36.5 C) 98 F (36.7 C)   TempSrc:  Oral Oral   Resp:  16 16   Height:      Weight:   296 lb 14.4 oz (134.673 kg)   SpO2:  98% 96%     Intake/Output Summary (Last 24 hours) at 03/29/15 0814 Last data filed at 03/29/15 0534  Gross per 24 hour  Intake    840 ml  Output   3425 ml  Net  -2585 ml   Filed Weights   03/27/15 1329 03/28/15 0456 03/29/15 0533  Weight: 297 lb 11.2 oz (135.036 kg) 297 lb 9.6 oz (134.99 kg) 296 lb 14.4 oz (134.673 kg)    Physical Exam      General: Obese African American male appearing in no acute distress. Head: Normocephalic, atraumatic.  Neck: Supple without bruits, unable to assess JVD secondary to body habitus. Lungs:  Resp regular and unlabored, CTA without wheezing or rales. Heart: RRR, S1, S2, no S3, S4, or murmur; positive rub. Abdomen: Soft, non-tender, non-distended with normoactive bowel sounds. No hepatomegaly. No rebound/guarding. No obvious abdominal masses. Extremities: No clubbing or cyanosis, 1+ bilateral lower extremity edema up to mid-shins. Distal pedal pulses are 2+ bilaterally. Neuro: Alert and oriented X 3. Moves all extremities spontaneously. Psych: Normal affect.  Labs    CBC  Recent Labs  03/28/15 0401 03/29/15 0459  WBC 11.2* 9.4  HGB 11.7* 12.7*  HCT 36.1* 37.9*  MCV 82.0 82.7  PLT 241 [id number]   Basic Metabolic Panel  Recent Labs  03/28/15 0401 03/29/15 0459  NA 137 135  K 3.4* 3.7  CL 100* 100*  CO2 29 29  GLUCOSE 147* 181*  BUN 18 19  CREATININE 1.32* 1.18  CALCIUM 8.9 8.9   Liver Function Tests  Recent Labs  03/27/15 1020  AST 34  ALT 46  ALKPHOS  83  BILITOT 0.5  PROT 7.7  ALBUMIN 3.8   Cardiac Enzymes  Recent Labs  03/27/15 1405 03/27/15 1825 03/27/15 2202  TROPONINI 0.07* 0.05* 0.05*   Hemoglobin A1C  Recent Labs  03/28/15 0401  HGBA1C 7.8*   Fasting Lipid Panel  Recent Labs  03/28/15 0401  CHOL 170  HDL 31*  LDLCALC 118*  TRIG 103  CHOLHDL 5.5    Telemetry    NSR, HR in 70's - 80's. No atopic events.  ECG    No new tracings.   Cardiac Studies and Radiology    Dg Chest 2 View: 03/27/2015  CLINICAL DATA:  Cough, congestion, shortness of breath and generalized weakness for 10 days. EXAM: CHEST  2 VIEW COMPARISON:  PA and lateral chest 12/15/2014 and 08/03/2014. FINDINGS: There is cardiomegaly and mild interstitial edema. No pneumothorax or pleural effusion. No focal bony abnormality. IMPRESSION: Cardiomegaly and mild interstitial  edema. Electronically Signed   By: Inge Rise M.D.   On: 03/27/2015 10:37   Echocardiogram: 03/27/2015 Study Conclusions - Procedure narrative: Transthoracic echocardiography. Image  quality was poor. The study was technically difficult, as a  result of poor acoustic windows, poor sound wave transmission,  and body habitus. - Left ventricle: The cavity size was mildly dilated. There was  mild concentric hypertrophy. Systolic function was mildly to  moderately reduced. The estimated ejection fraction was in the  range of 40% to 45%. Regional wall motion abnormalities cannot be  excluded. The study is not technically sufficient to allow  evaluation of LV diastolic function. - Mitral valve: There was mild regurgitation. - Left atrium: The atrium was moderately dilated. - Right ventricle: Systolic function was normal. - Pulmonary arteries: Systolic pressure could not be accurately  estimated.  Impressions: - Challenging image quality.  Assessment & Plan    1. 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 Isch vs Nonisch) - Pt with a h/o systolic dysfxn (EF of [id number] by echo in 01/2014) presented with acute on chronic DOE, edema, and orthopnea, which worsened over this past weekend. Echo this admission shows improved EF of 40-45%. - He does have a h/o dietary indiscretions and often misses doses of his medications.  - He has never undergone an ischemic evaluation. With his reduced EF would at minimum plan for a 2-day Lexiscan Myoview as an outpatient. - In the ED, he was markedly hypertensive at 200/93 and CXR showed cardiomegaly and interstitial edema. BNP mildly elevated at 441.BP and symptoms improved after Lasix 40 IV in the ED.  -  Continue ACE-I, BB, and Bidil. Continue IV Lasix 80mg  BID. Renal function remains stable. Net output of -3.9L thus far. Weight down 1 pound, but unsure of baseline as he was 280 lbs in 03/2014.  2.Hypertensive  Heart Disease - markedly hypertensive on admission in the setting of volume overload and some h/o medication noncompliance.  - BP improved after one dose of IV lasix. Continue BB, ACE-I, and Hydralazine/Nitrate.   3.HLD - LDL 118 in 03/2015.  - continue statin therapy.  4.Type II DM - A1c 7.8 this admission. On no medications PTA. - per admitting team  5. OSA  - Has not used CPAP in several years - says his machine is too old.  - CHF clinic has been trying to get him a new machine and [facility] apparently willing to cover up front cost of CPAP. Likely has PAH.  6.Morbid Obesity  - had previously been enrolled in cardiac rehab. Would  benefit from re-enrollment and aggressive nutritional counseling.  7.Hypokalemia - potassium at 3.7 on 03/29/2015. - continue to monitor.  8.Elevated Troponin - flat trend of 0.06, 0.07, 0.05, and 0.05. - Likely 2/2 demand ischemia in the setting of CHF. Denies chest pain.  - Continue ASA, statin, and BB - would consider outpatient ischemic evaluation as above.  Signed, Erma Heritage , PA-C 8:14 AM 03/29/2015 Pager: [phone]   I seen and evaluated the patient this morning along with Bernerd Pho, PA. We have reviewed the patient's chart together and discussed the patient on morning rounds. We have reviewed all available data. I spent 15 minutes in discussion with the patient about his clinical condition. Sounds like he has had an indolent weight gain with distended abdomen, having showing signs of clear continued volume overload. Interestingly, his echocardiogram during this hospitalization showed improved overall heart function. Flat trend of troponin probably more related to acute CHF and not to ischemia. History of OSA not currently used because the machine is too old. This needs to be fully better and discussed through the heart failure clinic to see if this situation can be real resolved. Because of his obesity, the  combination of cysts OSA possible OHS will only serve to worsen his right heart failure component.  He is on several antihypertensive agents for afterload reduction as well as preload reduction with the BiDil complements. Will gradually titrate up his carvedilol dose starting today. Recommendation: Increased carvedilol dose is noted, Continue IV diuresis. Think that as long as his urine output is maintaining stable net negative roughly 2 L a day, we should continue with boluses as opposed to Lasix drip for ease of management. Follow his renal function is improving with diuresis. Look for signs volume depletion with elevated BUN and creatinine after diuresis.  He likely has to 3 more days IV Lasix requirement prior to converting to oral Lasix. I would make sure that he has adequate urine output with oral Lasix prior to discharge, as he is high risk for bounce back if not adequately diuresed.[name]    He would be a good candidate to use of CHF vest to determine his volume status prior to discharge.     Leonie Man, M.D., M.S. Interventional Cardiologist   Pager # [phone] Phone # [phone] [address] [place], [place] [address]

## 2015-03-29 NOTE — Progress Notes (Signed)
Room air. NSR. Takes meds ok. Up to chair and tolerated it well. No pain. Urinal. FS are stable. Pt has no further concerns at this time.

## 2015-03-29 NOTE — Progress Notes (Signed)
[place] at [facility]                                                                                                                                                                                            Patient Demographics   Bradley Mckenzie, is a 50 y.o. male, DOB - 12/25/1965, QG:[id number]  Admit date - 03/27/2015   Admitting Physician Henreitta Leber, MD  Outpatient Primary MD for the patient is Nat Christen, PA-C   LOS - 2  Subjective: Patient complains of feeling weak. Still significant amount of swelling. Since admission his put out close to 4 L of urine. Denies any chest pains or shortness of breath with some improvement positive weakness   Review of Systems:   CONSTITUTIONAL: No documented fever. No fatigue,Positive weakness. No weight gain, no weight loss.  EYES: No blurry or double vision.  ENT: No tinnitus. No postnasal drip. No redness of the oropharynx.  RESPIRATORY: No cough, no wheeze, no hemoptysis. Positive dyspnea.  CARDIOVASCULAR: Positive chest pain. No orthopnea. No palpitations. No syncope.  GASTROINTESTINAL: No nausea, no vomiting or diarrhea. No abdominal pain. No melena or hematochezia.  GENITOURINARY: No dysuria or hematuria. Scrotal swelling ENDOCRINE: No polyuria or nocturia. No heat or cold intolerance.  HEMATOLOGY: No anemia. No bruising. No bleeding.  INTEGUMENTARY: No rashes. No lesions. Complains of swelling of the lower extremity MUSCULOSKELETAL: No arthritis. Positive swelling. No gout.  NEUROLOGIC: No numbness, tingling, or ataxia. No seizure-type activity.  PSYCHIATRIC: No anxiety. No insomnia. No ADD.    Vitals:   Filed Vitals:   03/28/15 1704 03/28/15 2029 03/29/15 0533 03/29/15 0715  BP: 150/86 142/76 140/85 150/79  Pulse: 87 80 85 86  Temp:  97.7 F (36.5 C) 98 F (36.7 C)   TempSrc:  Oral Oral   Resp:  16 16   Height:      Weight:   134.673 kg (296 lb 14.4 oz)   SpO2:  98% 96%      Wt Readings from Last 3 Encounters:  03/29/15 134.673 kg (296 lb 14.4 oz)  01/30/15 136.533 kg (301 lb)  01/20/15 136.533 kg (301 lb)     Intake/Output Summary (Last 24 hours) at 03/29/15 0821 Last data filed at 03/29/15 0534  Gross per 24 hour  Intake    840 ml  Output   3425 ml  Net  -2585 ml    Physical Exam:   GENERAL: Pleasant-appearing in no apparent distress.  HEAD, EYES, EARS, NOSE AND THROAT: Atraumatic, normocephalic. Extraocular muscles are intact. Pupils equal and reactive to light. Sclerae anicteric. No conjunctival injection.  No oro-pharyngeal erythema.  NECK: Supple. There is no jugular venous distention. No bruits, no lymphadenopathy, no thyromegaly.  HEART: Regular rate and rhythm,. No murmurs, no rubs, no clicks.  LUNGS: Occasional crackles at the bases no rales rhonchi wheezing  ABDOMEN: Soft, flat, nontender, nondistended. Has good bowel sounds. No hepatosplenomegaly appreciated.  EXTREMITIES: No evidence of any cyanosis, clubbing, 2+  peripheral edema.  +2 pedal and radial pulses bilaterally.  NEUROLOGIC: The patient is alert, awake, and oriented x3 with no focal motor or sensory deficits appreciated bilaterally.  SKIN: Moist and warm with no rashes appreciated.  Psych: Not anxious, depressed LN: No inguinal LN enlargement    Antibiotics   Anti-infectives    None      Medications   Scheduled Meds: . aspirin EC  81 mg Oral Daily  . carvedilol  6.25 mg Oral BID WC  . enoxaparin (LOVENOX) injection  40 mg Subcutaneous Q12H  . furosemide  80 mg Intravenous BID  . isosorbide dinitrate  20 mg Oral TID   And  . hydrALAZINE  37.5 mg Oral TID  . insulin aspart  0-9 Units Subcutaneous TID WC  . insulin aspart  0-9 Units Subcutaneous TID WC  . lisinopril  20 mg Oral Daily  . pantoprazole  40 mg Oral Daily  . potassium chloride SA  40 mEq Oral BID  . simvastatin  40 mg Oral Daily   Continuous Infusions:  PRN Meds:.acetaminophen **OR**  acetaminophen, albuterol, ondansetron **OR** ondansetron (ZOFRAN) IV   Data Review:   Micro Results No results found for this or any previous visit (from the past 240 hour(s)).  Radiology Reports Dg Chest 2 View  03/27/2015  CLINICAL DATA:  Cough, congestion, shortness of breath and generalized weakness for 10 days. EXAM: CHEST  2 VIEW COMPARISON:  PA and lateral chest 12/15/2014 and 08/03/2014. FINDINGS: There is cardiomegaly and mild interstitial edema. No pneumothorax or pleural effusion. No focal bony abnormality. IMPRESSION: Cardiomegaly and mild interstitial edema. Electronically Signed   By: Inge Rise M.D.   On: 03/27/2015 10:37     CBC  Recent Labs Lab 03/27/15 1020 03/27/15 1524 03/28/15 0401 03/29/15 0459  WBC 8.1 9.4 11.2* 9.4  HGB 12.5* 12.6* 11.7* 12.7*  HCT 38.2* 39.2* 36.1* 37.9*  PLT 258 243 241 245  MCV 83.4 83.1 82.0 82.7  MCH 27.2 26.8 26.5 27.7  MCHC 32.6 32.3 32.3 33.5  RDW 16.1* 16.3* 16.0* 16.0*    Chemistries   Recent Labs Lab 03/27/15 1020 03/27/15 1405 03/28/15 0401 03/29/15 0459  NA 138  --  137 135  K 3.2*  --  3.4* 3.7  CL 102  --  100* 100*  CO2 32  --  29 29  GLUCOSE 253*  --  147* 181*  BUN 15  --  18 19  CREATININE 1.15 1.10 1.32* 1.18  CALCIUM 9.2  --  8.9 8.9  AST 34  --   --   --   ALT 46  --   --   --   ALKPHOS 83  --   --   --   BILITOT 0.5  --   --   --    ------------------------------------------------------------------------------------------------------------------ estimated creatinine clearance is 101.2 mL/min (by C-G formula based on Cr of 1.18). ------------------------------------------------------------------------------------------------------------------  Recent Labs  03/28/15 0401  HGBA1C 7.8*   ------------------------------------------------------------------------------------------------------------------  Recent Labs  03/28/15 0401  CHOL 170  HDL 31*  LDLCALC 118*  TRIG 103  CHOLHDL 5.5    ------------------------------------------------------------------------------------------------------------------  No results for input(s): TSH, T4TOTAL, T3FREE, THYROIDAB in the last 72 hours.  Invalid input(s): FREET3 ------------------------------------------------------------------------------------------------------------------ No results for input(s): VITAMINB12, FOLATE, FERRITIN, TIBC, IRON, RETICCTPCT in the last 72 hours.  Coagulation profile No results for input(s): INR, PROTIME in the last 168 hours.  No results for input(s): DDIMER in the last 72 hours.  Cardiac Enzymes  Recent Labs Lab 03/27/15 1405 03/27/15 1825 03/27/15 2202  TROPONINI 0.07* 0.05* 0.05*   ------------------------------------------------------------------------------------------------------------------ Invalid input(s): POCBNP    Assessment & Plan   50 year old male with past medical history of cardiomyopathy with reduced ejection fraction, history of CHF, hypertension, depression presents to the hospital due to shortness of breath and noted to be in congestive heart failure. There is some question about his medical compliance  #1 CHF-acute on chronic systolic dysfunction. -Continues to have significant fluid overload. Will discuss with cardiology regarding possible Lasix drip. Continue high-dose Lasix seems to be responding. His renal function is stable currently. Appreciate cardiology input Continue Coreg   #2 elevated troponin-demand ischemia secondary to CHF. -Patient will need ischemic evaluation according to cardiology likely outpatient 2 day stress test   #3 accelerated hypertension-continue Inderal, hydralazine, Coreg Blood pressure stable with current therapy  #4 hyperlipidemia-continue simvastatin. Under not well controlled likely due to diet indiscretion possible med noncompliance   #5 hypokalemia-replaced  #6 GERD-continue Protonix.     Code Status Orders         Start     Ordered   03/27/15 1347  Full code   Continuous     03/27/15 1346    Code Status History    Date Active Date Inactive Code Status Order ID Comments User Context   08/07/2014  9:00 PM 08/08/2014  6:19 PM Full Code [id number]  Baxter Hire, MD Inpatient           Consults  cardiology  DVT Prophylaxis Lovenox  Lab Results  Component Value Date   PLT 245 03/29/2015     Time Spent in minutes   35 minutes   Greater than 50% time spent coordinating care with the patient explained him the disease process current diagnosis and plan a care Dustin Flock M.D on 03/29/2015 at 8:21 AM  Between 7am to 6pm - Pager - [phone]  After 6pm go to [email or web address] - password [email or web address] [facility]   Office  [phone]

## 2015-03-30 ENCOUNTER — Encounter: Payer: Self-pay | Admitting: Student

## 2015-03-30 DIAGNOSIS — I5043 Acute on chronic combined systolic (congestive) and diastolic (congestive) heart failure: Secondary | ICD-10-CM | POA: Diagnosis not present

## 2015-03-30 DIAGNOSIS — I429 Cardiomyopathy, unspecified: Secondary | ICD-10-CM | POA: Diagnosis not present

## 2015-03-30 DIAGNOSIS — E785 Hyperlipidemia, unspecified: Secondary | ICD-10-CM | POA: Diagnosis not present

## 2015-03-30 DIAGNOSIS — E119 Type 2 diabetes mellitus without complications: Secondary | ICD-10-CM | POA: Diagnosis not present

## 2015-03-30 LAB — BASIC METABOLIC PANEL
ANION GAP: 9 (ref 5–15)
BUN: 16 mg/dL (ref 6–20)
CALCIUM: 9.2 mg/dL (ref 8.9–10.3)
CO2: 27 mmol/L (ref 22–32)
Chloride: 98 mmol/L — ABNORMAL LOW (ref 101–111)
Creatinine, Ser: 1.17 mg/dL (ref 0.61–1.24)
GFR calc Af Amer: >60 mL/min (ref >60)
GFR calc non Af Amer: >60 mL/min (ref >60)
GLUCOSE: 181 mg/dL — AB (ref 65–99)
Potassium: 3.6 mmol/L (ref 3.5–5.1)
Sodium: 134 mmol/L — ABNORMAL LOW (ref 135–145)

## 2015-03-30 LAB — GLUCOSE, CAPILLARY
GLUCOSE-CAPILLARY: 145 mg/dL — AB (ref 65–99)
GLUCOSE-CAPILLARY: 161 mg/dL — AB (ref 65–99)
GLUCOSE-CAPILLARY: 222 mg/dL — AB (ref 65–99)
Glucose-Capillary: 179 mg/dL — ABNORMAL HIGH (ref 65–99)

## 2015-03-30 MED ORDER — FUROSEMIDE 10 MG/ML IJ SOLN
80.0000 mg | Freq: Two times a day (BID) | INTRAMUSCULAR | Status: DC
  Administered 2015-03-31: 80 mg via INTRAVENOUS
  Filled 2015-03-30: qty 8

## 2015-03-30 MED ORDER — FUROSEMIDE 10 MG/ML IJ SOLN
80.0000 mg | Freq: Four times a day (QID) | INTRAMUSCULAR | Status: AC
  Administered 2015-03-30 (×2): 80 mg via INTRAVENOUS
  Filled 2015-03-30 (×2): qty 8

## 2015-03-30 NOTE — Progress Notes (Signed)
Patient has rested quietly today with no complaints. Sinus rhythm 80's. Independent in the room. Will continue to monitor.

## 2015-03-30 NOTE — Progress Notes (Signed)
[place] at [facility]                                                                                                                                                                                            Patient Demographics   Bradley Mckenzie, is a 50 y.o. male, DOB - 1965/07/03, [id number]  Admit date - 03/27/2015   Admitting Physician Henreitta Leber, MD  Outpatient Primary MD for the patient is Nat Christen, PA-C   LOS - 3  Subjective: Continues to complain of swelling of the lower extremity swelling. Breathing improved  Review of Systems:   CONSTITUTIONAL: No documented fever. No fatigue,Positive weakness. No weight gain, no weight loss.  EYES: No blurry or double vision.  ENT: No tinnitus. No postnasal drip. No redness of the oropharynx.  RESPIRATORY: No cough, no wheeze, no hemoptysis. Improved dyspnea.  CARDIOVASCULAR: Positive chest pain. No orthopnea. No palpitations. No syncope.  GASTROINTESTINAL: No nausea, no vomiting or diarrhea. No abdominal pain. No melena or hematochezia.  GENITOURINARY: No dysuria or hematuria. Scrotal swelling ENDOCRINE: No polyuria or nocturia. No heat or cold intolerance.  HEMATOLOGY: No anemia. No bruising. No bleeding.  INTEGUMENTARY: No rashes. No lesions. Complains of swelling of the lower extremity MUSCULOSKELETAL: No arthritis. Positive swelling. No gout.  NEUROLOGIC: No numbness, tingling, or ataxia. No seizure-type activity.  PSYCHIATRIC: No anxiety. No insomnia. No ADD.    Vitals:   Filed Vitals:   03/29/15 2005 03/30/15 0500 03/30/15 0501 03/30/15 0820  BP: 134/69  156/85 159/79  Pulse: 89  86 89  Temp: 98.4 F (36.9 C)  97.9 F (36.6 C)   TempSrc: Oral  Oral   Resp: 20  20   Height:      Weight:  133.947 kg (295 lb 4.8 oz)    SpO2: 94%  90%     Wt Readings from Last 3 Encounters:  03/30/15 133.947 kg (295 lb 4.8 oz)  01/30/15 136.533 kg (301 lb)  01/20/15 136.533 kg (301  lb)     Intake/Output Summary (Last 24 hours) at 03/30/15 0909 Last data filed at 03/30/15 0800  Gross per 24 hour  Intake    720 ml  Output   2475 ml  Net  -1755 ml    Physical Exam:   GENERAL: Pleasant-appearing in no apparent distress.  HEAD, EYES, EARS, NOSE AND THROAT: Atraumatic, normocephalic. Extraocular muscles are intact. Pupils equal and reactive to light. Sclerae anicteric. No conjunctival injection. No oro-pharyngeal erythema.  NECK: Supple. There is no jugular venous distention. No bruits, no lymphadenopathy, no thyromegaly.  HEART: Regular rate and  rhythm,. No murmurs, no rubs, no clicks.  LUNGS: Occasional crackles at the bases no rales rhonchi wheezing  ABDOMEN: Soft, flat, nontender, nondistended. Has good bowel sounds. No hepatosplenomegaly appreciated.  EXTREMITIES: No evidence of any cyanosis, clubbing, 2+  peripheral edema.  +2 pedal and radial pulses bilaterally.  NEUROLOGIC: The patient is alert, awake, and oriented x3 with no focal motor or sensory deficits appreciated bilaterally.  SKIN: Moist and warm with no rashes appreciated.  Psych: Not anxious, depressed LN: No inguinal LN enlargement    Antibiotics   Anti-infectives    None      Medications   Scheduled Meds: . aspirin EC  81 mg Oral Daily  . carvedilol  9.375 mg Oral BID WC  . enoxaparin (LOVENOX) injection  40 mg Subcutaneous Q12H  . furosemide  80 mg Intravenous BID  . isosorbide dinitrate  20 mg Oral TID   And  . hydrALAZINE  37.5 mg Oral TID  . insulin aspart  0-9 Units Subcutaneous TID WC  . lisinopril  20 mg Oral Daily  . pantoprazole  40 mg Oral Daily  . potassium chloride SA  40 mEq Oral BID  . simvastatin  40 mg Oral Daily   Continuous Infusions:  PRN Meds:.acetaminophen **OR** acetaminophen, albuterol, ondansetron **OR** ondansetron (ZOFRAN) IV   Data Review:   Micro Results No results found for this or any previous visit (from the past 240 hour(s)).  Radiology  Reports Dg Chest 2 View  03/27/2015  CLINICAL DATA:  Cough, congestion, shortness of breath and generalized weakness for 10 days. EXAM: CHEST  2 VIEW COMPARISON:  PA and lateral chest 12/15/2014 and 08/03/2014. FINDINGS: There is cardiomegaly and mild interstitial edema. No pneumothorax or pleural effusion. No focal bony abnormality. IMPRESSION: Cardiomegaly and mild interstitial edema. Electronically Signed   By: Inge Rise M.D.   On: 03/27/2015 10:37     CBC  Recent Labs Lab 03/27/15 1020 03/27/15 1524 03/28/15 0401 03/29/15 0459  WBC 8.1 9.4 11.2* 9.4  HGB 12.5* 12.6* 11.7* 12.7*  HCT 38.2* 39.2* 36.1* 37.9*  PLT 258 243 241 245  MCV 83.4 83.1 82.0 82.7  MCH 27.2 26.8 26.5 27.7  MCHC 32.6 32.3 32.3 33.5  RDW 16.1* 16.3* 16.0* 16.0*    Chemistries   Recent Labs Lab 03/27/15 1020 03/27/15 1405 03/28/15 0401 03/29/15 0459 03/30/15 0510  NA 138  --  137 135 134*  K 3.2*  --  3.4* 3.7 3.6  CL 102  --  100* 100* 98*  CO2 32  --  29 29 27   GLUCOSE 253*  --  147* 181* 181*  BUN 15  --  18 19 16   CREATININE 1.15 1.10 1.32* 1.18 1.17  CALCIUM 9.2  --  8.9 8.9 9.2  AST 34  --   --   --   --   ALT 46  --   --   --   --   ALKPHOS 83  --   --   --   --   BILITOT 0.5  --   --   --   --    ------------------------------------------------------------------------------------------------------------------ estimated creatinine clearance is 101.8 mL/min (by C-G formula based on Cr of 1.17). ------------------------------------------------------------------------------------------------------------------  Recent Labs  03/28/15 0401  HGBA1C 7.8*   ------------------------------------------------------------------------------------------------------------------  Recent Labs  03/28/15 0401  CHOL 170  HDL 31*  LDLCALC 118*  TRIG 103  CHOLHDL 5.5   ------------------------------------------------------------------------------------------------------------------ No  results for input(s): TSH, T4TOTAL, T3FREE, THYROIDAB in the  last 72 hours.  Invalid input(s): FREET3 ------------------------------------------------------------------------------------------------------------------ No results for input(s): VITAMINB12, FOLATE, FERRITIN, TIBC, IRON, RETICCTPCT in the last 72 hours.  Coagulation profile No results for input(s): INR, PROTIME in the last 168 hours.  No results for input(s): DDIMER in the last 72 hours.  Cardiac Enzymes  Recent Labs Lab 03/27/15 1405 03/27/15 1825 03/27/15 2202  TROPONINI 0.07* 0.05* 0.05*   ------------------------------------------------------------------------------------------------------------------ Invalid input(s): POCBNP    Assessment & Plan   50 year old male with past medical history of cardiomyopathy with reduced ejection fraction, history of CHF, hypertension, depression presents to the hospital due to shortness of breath and noted to be in congestive heart failure. There is some question about his medical compliance  #1 CHF-acute on chronic systolic dysfunction. -Continues To be fluid overload - Continue high-dose Lasix have discussed the case with cardiology - I will increase the dose of Coreg  #2 elevated troponin-demand ischemia secondary to CHF. -Patient will need ischemic evaluation according to cardiology likely outpatient 2 day stress test   #3 accelerated hypertension-continue Inderal, hydralazine, Coreg Increase Coreg  #4 hyperlipidemia-continue simvastatin. Under not well controlled due to med noncompliance   #5 hypokalemia-replaced  #6 GERD-continue Protonix.     Code Status Orders        Start     Ordered   03/27/15 1347  Full code   Continuous     03/27/15 1346    Code Status History    Date Active Date Inactive Code Status Order ID Comments User Context   08/07/2014  9:00 PM 08/08/2014  6:19 PM Full Code [id number]  Baxter Hire, MD Inpatient            Consults  cardiology  DVT Prophylaxis Lovenox  Lab Results  Component Value Date   PLT 245 03/29/2015     Time Spent in minutes   32 minutes   Greater than 50% time spent coordinating care with the patient explained him the disease process current diagnosis and plan a care is the case with cardiology Dustin Flock M.D on 03/30/2015 at 9:09 AM  Between 7am to 6pm - Pager - [phone]  After 6pm go to [email or web address] - password [email or web address] [place] [facility]   Office  [phone]

## 2015-03-30 NOTE — Progress Notes (Signed)
Patient: Bradley Mckenzie. / Admit Date: 03/27/2015 / Date of Encounter: 03/30/2015, 9:44 AM   Subjective: Improving SOB, not at baseline,  Still with significant leg edema, abdomen feels bloated Feels like he is not urinating enough fall today, resolved quickly after Lasix bolus   Review of Systems: Review of Systems  Constitutional: Negative.   Respiratory: Positive for shortness of breath.   Cardiovascular: Positive for leg swelling.  Gastrointestinal: Negative.   Musculoskeletal: Negative.   Neurological: Negative.   Psychiatric/Behavioral: Negative.   All other systems reviewed and are negative.   Objective: Telemetry: Normal sinus rhythm Physical Exam: Blood pressure 159/79, pulse 89, temperature 97.9 F (36.6 C), temperature source Oral, resp. rate 20, height 5\' 7"  (1.702 m), weight 295 lb 4.8 oz (133.947 kg), SpO2 90 %. Body mass index is 46.24 kg/(m^2). General: Well developed, well nourished, in no acute distress. Head: Normocephalic, atraumatic, sclera non-icteric, no xanthomas, nares are without discharge. Neck: Negative for carotid bruits. JVP not elevated. Lungs: Clear bilaterally to auscultation without wheezes, rales, or rhonchi. Breathing is unlabored. Heart: RRR S1 S2 without murmurs, rubs, or gallops.  Abdomen: Soft, non-tender, non-distended with normoactive bowel sounds. No rebound/guarding. Extremities: No clubbing or cyanosis. 1+ pitting edema to below the knees. Distal pedal pulses are 2+ and equal bilaterally. Neuro: Alert and oriented X 3. Moves all extremities spontaneously. Psych:  Responds to questions appropriately with a normal affect.   Intake/Output Summary (Last 24 hours) at 03/30/15 0944 Last data filed at 03/30/15 0800  Gross per 24 hour  Intake    960 ml  Output   2475 ml  Net  -1515 ml    Inpatient Medications:  . aspirin EC  81 mg Oral Daily  . carvedilol  9.375 mg Oral BID WC  . enoxaparin (LOVENOX) injection  40 mg  Subcutaneous Q12H  . furosemide  80 mg Intravenous BID  . isosorbide dinitrate  20 mg Oral TID   And  . hydrALAZINE  37.5 mg Oral TID  . insulin aspart  0-9 Units Subcutaneous TID WC  . lisinopril  20 mg Oral Daily  . pantoprazole  40 mg Oral Daily  . potassium chloride SA  40 mEq Oral BID  . simvastatin  40 mg Oral Daily   Infusions:    Labs:  Recent Labs  03/29/15 0459 03/30/15 0510  NA 135 134*  K 3.7 3.6  CL 100* 98*  CO2 29 27  GLUCOSE 181* 181*  BUN 19 16  CREATININE 1.18 1.17  CALCIUM 8.9 9.2    Recent Labs  03/27/15 1020  AST 34  ALT 46  ALKPHOS 83  BILITOT 0.5  PROT 7.7  ALBUMIN 3.8    Recent Labs  03/28/15 0401 03/29/15 0459  WBC 11.2* 9.4  HGB 11.7* 12.7*  HCT 36.1* 37.9*  MCV 82.0 82.7  PLT 241 245    Recent Labs  03/27/15 1020 03/27/15 1405 03/27/15 1825 03/27/15 2202  TROPONINI 0.06* 0.07* 0.05* 0.05*   Invalid input(s): POCBNP  Recent Labs  03/28/15 0401  HGBA1C 7.8*     Weights: Filed Weights   03/28/15 0456 03/29/15 0533 03/30/15 0500  Weight: 297 lb 9.6 oz (134.99 kg) 296 lb 14.4 oz (134.673 kg) 295 lb 4.8 oz (133.947 kg)     Radiology/Studies:  Dg Chest 2 View  03/27/2015  CLINICAL DATA:  Cough, congestion, shortness of breath and generalized weakness for 10 days. EXAM: CHEST  2 VIEW COMPARISON:  PA and lateral chest  12/15/2014 and 08/03/2014. FINDINGS: There is cardiomegaly and mild interstitial edema. No pneumothorax or pleural effusion. No focal bony abnormality. IMPRESSION: Cardiomegaly and mild interstitial edema. Electronically Signed   By: Inge Rise M.D.   On: 03/27/2015 10:37     Assessment and Plan  50 y.o. male   1. 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 Isch vs Nonisch): h/o systolic dysfxn (EF of [id number] by echo in 01/2014)  presenting with acute on chronic DOE, edema, and orthopnea,  We will increase Lasix dosing 80 mg IV every 6 2 doses then every 8 hours --  Cont ? blocker, acei, bidil.   2. Hypertensive Heart Disease:  hypertensive on admission in the setting of volume overload   h/o medication noncompliance  Cont ? blocker, acei, hydral/nitrate.  Blood pressure much improved, she continued to improve with diuresis   3. HL:  LDL 112 in 01/2014.   on simvastatin.  LFT's ok.  4. Type II DM:  Hyperglycemic on admission.  On no meds @ home. Per IM.  5. OSA:  has not used CPAP in several years - says his machine is too old.  CHF clinic has been trying to get him a new machine and [facility] apparently willing to cover up front cost of CPAP.   6. Morbid Obesity:  previously enrolled in cardiac rehab.  Would benefit from re-enrollment and aggressive nutritional counseling.  7. Hypokalemia:  Will need daily potassium 40 mEq   8. Elevated Troponin:  Likely 2/2 demand ischemia in the setting of CHF.   he will need ischemic eval  as an outpt   Signed, Esmond Plants, MD, Ph.D. [facility] 03/30/2015, 9:44 AM

## 2015-03-31 LAB — BASIC METABOLIC PANEL
ANION GAP: 11 (ref 5–15)
BUN: 17 mg/dL (ref 6–20)
CALCIUM: 8.6 mg/dL — AB (ref 8.9–10.3)
CO2: 24 mmol/L (ref 22–32)
Chloride: 94 mmol/L — ABNORMAL LOW (ref 101–111)
Creatinine, Ser: 1.36 mg/dL — ABNORMAL HIGH (ref 0.61–1.24)
GLUCOSE: 219 mg/dL — AB (ref 65–99)
POTASSIUM: 3.4 mmol/L — AB (ref 3.5–5.1)
SODIUM: 129 mmol/L — AB (ref 135–145)

## 2015-03-31 LAB — GLUCOSE, CAPILLARY
GLUCOSE-CAPILLARY: 248 mg/dL — AB (ref 65–99)
GLUCOSE-CAPILLARY: 174 mg/dL — AB (ref 65–99)

## 2015-03-31 MED ORDER — CARVEDILOL 12.5 MG PO TABS: 12.5000 mg | ORAL_TABLET | Freq: Two times a day (BID) | ORAL | Status: DC

## 2015-03-31 MED ORDER — FUROSEMIDE 10 MG/ML IJ SOLN: 80.0000 mg | Freq: Three times a day (TID) | INTRAMUSCULAR | Status: DC

## 2015-03-31 MED ORDER — POTASSIUM CHLORIDE CRYS ER 20 MEQ PO TBCR
40.0000 meq | EXTENDED_RELEASE_TABLET | Freq: Once | ORAL | Status: AC
  Administered 2015-03-31: 40 meq via ORAL
  Filled 2015-03-31: qty 2

## 2015-03-31 NOTE — Care Management (Signed)
For discharge.  No medications needs.

## 2015-03-31 NOTE — Discharge Instructions (Signed)
Heart Failure Clinic appointment on March 29 , 2017 at 1:00pm with Darylene Price, [place]. Please call [phone] to reschedule.    DIET:  Cardiac diet  DISCHARGE CONDITION:  Stable  ACTIVITY:  Activity as tolerated  OXYGEN:  Home Oxygen: No.   Oxygen Delivery: room air  DISCHARGE LOCATION:  home    ADDITIONAL DISCHARGE INSTRUCTION:   If you experience worsening of your admission symptoms, develop shortness of breath, life threatening emergency, suicidal or homicidal thoughts you must seek medical attention immediately by calling 911 or calling your MD immediately  if symptoms less severe.  You Must read complete instructions/literature along with all the possible adverse reactions/side effects for all the Medicines you take and that have been prescribed to you. Take any new Medicines after you have completely understood and accpet all the possible adverse reactions/side effects.   Please note  You were cared for by a hospitalist during your hospital stay. If you have any questions about your discharge medications or the care you received while you were in the hospital after you are discharged, you can call the unit and asked to speak with the hospitalist on call if the hospitalist that took care of you is not available. Once you are discharged, your primary care physician will handle any further medical issues. Please note that NO REFILLS for any discharge medications will be authorized once you are discharged, as it is imperative that you return to your primary care physician (or establish a relationship with a primary care physician if you do not have one) for your aftercare needs so that they can reassess your need for medications and monitor your lab values.

## 2015-03-31 NOTE — Progress Notes (Signed)
Bradley Mckenzie at [place] was admitted to the Hospital on 03/27/2015 and Discharged  03/31/2015 and should be excused from work/school   for7  days starting 03/27/2015 , may return to work/school without any restrictions.  Call Dustin Flock MD with questions.  Dustin Flock M.D on 03/31/2015,at 8:27 AM  [place] at [facility]  [phone]

## 2015-03-31 NOTE — Progress Notes (Signed)
Patient to speak with dietitian regarding diet education and compliance issues prior to discharge. Stated his wife will be here around 11am to pick him up and that would be a good time for them to speak with her. Called Anda Kraft, said she would be here around that time and I will inform her when wife arrives. Will prepare discharge packet.

## 2015-03-31 NOTE — Discharge Summary (Signed)
Bradley Pitts., 50 y.o., DOB [birth year], MRN [id number]. Admission date: 03/27/2015 Discharge Date 03/31/2015 Primary MD Nat Christen, PA-C Admitting Physician Henreitta Leber, MD  Admission Diagnosis  Acute on chronic systolic congestive heart failure [facility]) [I50.23]  Discharge Diagnosis   Principal Problem:   Acute on chronic combined systolic (congestive) and diastolic (congestive) heart failure (HCC)   Obstructive sleep apnea   Cardiomyopathy ([place])   Hyperlipidemia   Morbid obesity ([place])   Diabetes mellitus without complication ([place])   Hypertensive heart disease   Dietary indiscretion medical noncompliance        Hospital Course Bradley Mckenzie is a 50 y.o. male with a known history of CHF, cardiomyopathy with reduced ejection fraction, obstructive sleep apnea, hypertension, depression who presents to the hospital due to shortness of breath, lower extremity edema and noted to be in CHF. He was admitted with acute on chronic CHF exasperation. He was treated with IV Lasix with 9 L of fluid removal. He was seen by cardiology who followed the patient throughout the hospitalization. They recommended outpatient stress test which will need to be arranged by them. Patient's breathing is much improved. Even while in the hospital he was drinking multiple bottles of soda. And was told that he needed to watch his fluid intake.            Consults  cardiology  Significant Tests:  See full reports for all details    Dg Chest 2 View  03/27/2015  CLINICAL DATA:  Cough, congestion, shortness of breath and generalized weakness for 10 days. EXAM: CHEST  2 VIEW COMPARISON:  PA and lateral chest 12/15/2014 and 08/03/2014. FINDINGS: There is cardiomegaly and mild interstitial edema. No pneumothorax or pleural effusion. No focal bony abnormality. IMPRESSION: Cardiomegaly and mild interstitial edema. Electronically Signed   By: Inge Rise M.D.   On: 03/27/2015 10:37       Today    Subjective:   Bradley Mckenzie  feels much better swelling the lower extremity mostly resolved  Objective:   Blood pressure 132/63, pulse 85, temperature 97.8 F (36.6 C), temperature source Oral, resp. rate 18, height 5\' 7"  (1.702 m), weight 132.722 kg (292 lb 9.6 oz), SpO2 96 %.  .  Intake/Output Summary (Last 24 hours) at 03/31/15 1458 Last data filed at 03/31/15 1204  Gross per 24 hour  Intake    360 ml  Output   3175 ml  Net  -2815 ml    Exam VITAL SIGNS: Blood pressure 132/63, pulse 85, temperature 97.8 F (36.6 C), temperature source Oral, resp. rate 18, height 5\' 7"  (1.702 m), weight 132.722 kg (292 lb 9.6 oz), SpO2 96 %.  GENERAL:  50 y.o.-year-old patient lying in the bed with no acute distress.  EYES: Pupils equal, round, reactive to light and accommodation. No scleral icterus. Extraocular muscles intact.  HEENT: Head atraumatic, normocephalic. Oropharynx and nasopharynx clear.  NECK:  Supple, no jugular venous distention. No thyroid enlargement, no tenderness.  LUNGS: Normal breath sounds bilaterally, no wheezing, rales,rhonchi or crepitation. No use of accessory muscles of respiration.  CARDIOVASCULAR: S1, S2 normal. No murmurs, rubs, or gallops.  ABDOMEN: Soft, nontender, nondistended. Bowel sounds present. No organomegaly or mass.  EXTREMITIES:1+ pedal edema, cyanosis, or clubbing.  NEUROLOGIC: Cranial nerves II through XII are intact. Muscle strength 5/5 in all extremities. Sensation intact. Gait not checked.  PSYCHIATRIC: The patient is alert and oriented x 3.  SKIN: No obvious rash, lesion, or ulcer.   Data Review  CBC w Diff: Lab Results  Component Value Date   WBC 9.4 03/29/2015   WBC 9.8 02/07/2014   HGB 12.7* 03/29/2015   HGB 11.9* 02/07/2014   HCT 37.9* 03/29/2015   HCT 37.1* 02/07/2014   PLT 245 03/29/2015   PLT 262 02/07/2014   LYMPHOPCT 14.8 02/07/2014   MONOPCT 8.4 02/07/2014   EOSPCT 1.8 02/07/2014   BASOPCT 0.8 02/07/2014   CMP: Lab  Results  Component Value Date   NA 129* 03/31/2015   NA 141 02/07/2014   K 3.4* 03/31/2015   K 3.2* 02/07/2014   CL 94* 03/31/2015   CL 103 02/07/2014   CO2 24 03/31/2015   CO2 31 02/07/2014   BUN 17 03/31/2015   BUN 14 02/07/2014   CREATININE 1.36* 03/31/2015   CREATININE 1.24 02/07/2014   PROT 7.7 03/27/2015   PROT 7.7 02/07/2014   ALBUMIN 3.8 03/27/2015   ALBUMIN 3.4 02/07/2014   BILITOT 0.5 03/27/2015   BILITOT 0.6 02/07/2014   ALKPHOS 83 03/27/2015   ALKPHOS 79 02/07/2014   AST 34 03/27/2015   AST 25 02/07/2014   ALT 46 03/27/2015   ALT 28 02/07/2014  .  Micro Results No results found for this or any previous visit (from the past 240 hour(s)).      Code Status Orders        Start     Ordered   03/27/15 [id number]  Full code   Continuous     03/27/15 1346    Code Status History    Date Active Date Inactive Code Status Order ID Comments User Context   08/07/2014  9:00 PM 08/08/2014  6:19 PM Full Code [id number]  Baxter Hire, MD Inpatient          Follow-up Information    Follow up with Alisa Graff, FNP. Go on 04/12/2015.   Specialty:  Family Medicine   Why:  at 1:00pm , to the [facility]   Contact information:   [place] [address] [place] [address] [phone]       Follow up with Nat Christen, PA-C In 7 days.   Specialty:  Physician Assistant   Why:  Thursday, March 23rd at [place], ccs   Contact information:   [place] [address] [phone]       Follow up with Ida Rogue, MD In 7 days.   Specialty:  Cardiology   Why:  Monday, May 1st at 11am, [facility] information:   [place] [address] [phone]       Discharge Medications     Medication List    TAKE these medications        albuterol 108 (90 Base) MCG/ACT inhaler  Commonly known as:  PROVENTIL HFA;VENTOLIN HFA  Inhale 2 puffs into the lungs every 6 (six) hours as needed for  wheezing or shortness of breath.     aspirin EC 81 MG tablet  Take 81 mg by mouth daily.     carvedilol 12.5 MG tablet  Commonly known as:  COREG  Take 1 tablet (12.5 mg total) by mouth 2 (two) times daily with a meal.     esomeprazole 40 MG capsule  Commonly known as:  NEXIUM  Take 1 capsule (40 mg total) by mouth daily.     famotidine 20 MG tablet  Commonly known as:  PEPCID  Take 1 tablet (20 mg total) by mouth 2 (two)  times daily as needed for heartburn or indigestion.     isosorbide-hydrALAZINE 20-37.5 MG tablet  Commonly known as:  BIDIL  Take 1 tablet by mouth 3 (three) times daily.     lisinopril 20 MG tablet  Commonly known as:  PRINIVIL,ZESTRIL  Take 1 tablet (20 mg total) by mouth daily.     potassium chloride SA 20 MEQ tablet  Commonly known as:  K-DUR,KLOR-CON  Take 1 tablet (20 mEq total) by mouth 2 (two) times daily.     simvastatin 40 MG tablet  Commonly known as:  ZOCOR  Take 1 tablet (40 mg total) by mouth daily.     torsemide 20 MG tablet  Commonly known as:  DEMADEX  Take 2 tablets (40 mg total) by mouth 2 (two) times daily.           Total Time in preparing paper work, data evaluation and todays exam - 35 minutes  Dustin Flock M.D on 03/31/2015 at 2:58 PM  [facility]   Office  [phone]

## 2015-03-31 NOTE — Consult Note (Signed)
   [facility] CM Inpatient Consult   03/31/2015  Bradley Mckenzie. 1965/04/09 [id number]  Patient was evaluated for eligibility of [facility] care management services. Patient is not eligible for [place] at this time.  For questions please contact:   Nell Schrack RN, [facility] Liaison  [phone]) Business Mobile [phone]) Toll free office

## 2015-03-31 NOTE — Progress Notes (Signed)
Patient: Bradley Mckenzie. / Admit Date: 03/27/2015 / Date of Encounter: 03/31/2015, 8:11 AM   Subjective: Improving SOB, not at baseline,  Improving leg edema. He is negative almost 9 L with admission and his weight is down 5 pounds.   Review of Systems: Review of Systems  Constitutional: Negative.   Respiratory: Positive for shortness of breath.   Cardiovascular: Positive for leg swelling.  Gastrointestinal: Negative.   Musculoskeletal: Negative.   Neurological: Negative.   Psychiatric/Behavioral: Negative.   All other systems reviewed and are negative.   Objective: Telemetry: Normal sinus rhythm Physical Exam: Blood pressure 133/74, pulse 85, temperature 97.8 F (36.6 C), temperature source Oral, resp. rate 18, height 5\' 7"  (1.702 m), weight 292 lb 9.6 oz (132.722 kg), SpO2 96 %. Body mass index is 45.82 kg/(m^2). General: Well developed, well nourished, in no acute distress. Head: Normocephalic, atraumatic, sclera non-icteric, no xanthomas, nares are without discharge. Neck: Negative for carotid bruits. JVP not elevated. Lungs: Clear bilaterally to auscultation without wheezes, rales, or rhonchi. Breathing is unlabored. Heart: RRR S1 S2 without murmurs, rubs, or gallops.  Abdomen: Soft, non-tender, non-distended with normoactive bowel sounds. No rebound/guarding. Extremities: No clubbing or cyanosis. 1+ pitting edema to below the knees. Distal pedal pulses are 2+ and equal bilaterally. Neuro: Alert and oriented X 3. Moves all extremities spontaneously. Psych:  Responds to questions appropriately with a normal affect.   Intake/Output Summary (Last 24 hours) at 03/31/15 0811 Last data filed at 03/31/15 0526  Gross per 24 hour  Intake    480 ml  Output   3925 ml  Net  -3445 ml    Inpatient Medications:  . aspirin EC  81 mg Oral Daily  . carvedilol  9.375 mg Oral BID WC  . enoxaparin (LOVENOX) injection  40 mg Subcutaneous Q12H  . furosemide  80 mg Intravenous BID    . isosorbide dinitrate  20 mg Oral TID   And  . hydrALAZINE  37.5 mg Oral TID  . insulin aspart  0-9 Units Subcutaneous TID WC  . lisinopril  20 mg Oral Daily  . pantoprazole  40 mg Oral Daily  . potassium chloride SA  40 mEq Oral BID  . simvastatin  40 mg Oral Daily   Infusions:    Labs:  Recent Labs  03/30/15 0510 03/31/15 0434  NA 134* 129*  K 3.6 3.4*  CL 98* 94*  CO2 27 24  GLUCOSE 181* 219*  BUN 16 17  CREATININE 1.17 1.36*  CALCIUM 9.2 8.6*   No results for input(s): AST, ALT, ALKPHOS, BILITOT, PROT, ALBUMIN in the last 72 hours.  Recent Labs  03/29/15 0459  WBC 9.4  HGB 12.7*  HCT 37.9*  MCV 82.7  PLT 245   No results for input(s): CKTOTAL, CKMB, TROPONINI in the last 72 hours. Invalid input(s): POCBNP No results for input(s): HGBA1C in the last 72 hours.   Weights: Filed Weights   03/29/15 0533 03/30/15 0500 03/31/15 0526  Weight: 296 lb 14.4 oz (134.673 kg) 295 lb 4.8 oz (133.947 kg) 292 lb 9.6 oz (132.722 kg)     Radiology/Studies:  Dg Chest 2 View  03/27/2015  CLINICAL DATA:  Cough, congestion, shortness of breath and generalized weakness for 10 days. EXAM: CHEST  2 VIEW COMPARISON:  PA and lateral chest 12/15/2014 and 08/03/2014. FINDINGS: There is cardiomegaly and mild interstitial edema. No pneumothorax or pleural effusion. No focal bony abnormality. IMPRESSION: Cardiomegaly and mild interstitial edema. Electronically Signed  By: Inge Rise M.D.   On: 03/27/2015 10:37     Assessment and Plan  50 y.o. male   1. 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 Isch vs Nonisch): h/o systolic dysfxn (EF of [id number] by echo in 01/2014)  presenting with acute on chronic DOE, edema, and orthopnea,  His creatinines up to 1.36 and overall it is -9 L. We might have reached the maximum we can do with IV diuresis. Consider switching to furosemide 80 mg by mouth twice daily The patient needs to significant education regarding  low-sodium diet. -- Cont ? blocker, acei, bidil.   2. Hypertensive Heart Disease:  hypertensive on admission in the setting of volume overload   h/o medication noncompliance  Cont ? blocker, acei, hydral/nitrate.  Blood pressure much improved.  3. HL:  LDL 112 in 01/2014.   on simvastatin.  LFT's ok.  4. Type II DM:  Hyperglycemic on admission.  On no meds @ home. Per IM.  5. OSA:  has not used CPAP in several years - says his machine is too old.  CHF clinic has been trying to get him a new machine and [facility] apparently willing to cover up front cost of CPAP.   6. Morbid Obesity:  previously enrolled in cardiac rehab.  Would benefit from re-enrollment and aggressive nutritional counseling.  7. Hypokalemia:  Will need daily potassium 40 mEq   8. Elevated Troponin:  Likely 2/2 demand ischemia in the setting of CHF.   he will need ischemic eval  as an outpt   Signed, Kathlyn [place], MD  [facility] 03/31/2015, 8:11 AM

## 2015-03-31 NOTE — Plan of Care (Signed)
Problem: Food- and Nutrition-Related Knowledge Deficit (NB-1.1) Goal: Nutrition education Formal process to instruct or train a patient/client in a skill or to impart knowledge to help patients/clients voluntarily manage or modify food choices and eating behavior to maintain or improve health. Outcome: Completed/Met Date Met:  03/31/15 Nutrition Education Note  RD consulted for nutrition education regarding CHF.  RD provided "Heart Failure Nutrition Therapy" handout from the Academy of [employer]etics. Reviewed patient's dietary recall. Provided examples on ways to decrease sodium intake in diet. Discouraged intake of processed foods and use of salt shaker. Encouraged fresh fruits and vegetables as well as whole grain sources of carbohydrates to maximize fiber intake. Fiancee cooks for a living and was present during education, very supportive with regards to pt following appropriate diet.   RD discussed why it is important for patient to adhere to diet recommendations, and emphasized the role of fluids, foods to avoid, and importance of weighing self daily. Teach back method used.  Expect good compliance.  Chart reviewed. Pt being discharged today. Please re-consult RD if further assistance is needed.   Kerman Passey [place], [place], LDN [phone] Pager  [phone] Weekend/On-Call Pager

## 2015-03-31 NOTE — Progress Notes (Signed)
Wife at bedside. Spoke with Suzy Bouchard, RD. Discharge instructions printed for patient. CCMD notified patient will be coming off monitor soon for discharge. Patient wants to finish lunch before leaving.   Patient given discharge teaching and paperwork regarding medications, diet, follow-up appointments and activity (CHF packet given and discussed). Patient understanding verbalized. No complaints at this time. IV and telemetry discontinued prior to leaving. Skin assessment as previously charted and vitals are stable; on room air. Patient being discharged to home. Wife present during discharge teaching. No further needs by Care Management. Prescriptions sent to [facility] by physician.

## 2015-04-12 ENCOUNTER — Telehealth: Payer: Self-pay | Admitting: Family

## 2015-04-12 ENCOUNTER — Ambulatory Visit: Payer: Self-pay | Admitting: Family

## 2015-04-12 NOTE — Telephone Encounter (Signed)
Patient did not show for his Heart Failure Clinic appointment on 04/12/15. Will attempt to reschedule.

## 2015-05-15 ENCOUNTER — Ambulatory Visit: Payer: No Typology Code available for payment source | Admitting: Cardiovascular Disease

## 2015-05-15 ENCOUNTER — Encounter: Payer: Self-pay | Admitting: *Deleted

## 2015-06-30 DIAGNOSIS — I43 Cardiomyopathy in diseases classified elsewhere: Secondary | ICD-10-CM

## 2015-06-30 DIAGNOSIS — I119 Hypertensive heart disease without heart failure: Secondary | ICD-10-CM

## 2015-06-30 HISTORY — DX: Hypertensive heart disease without heart failure: I11.9

## 2015-07-08 ENCOUNTER — Emergency Department: Payer: Self-pay

## 2015-07-08 ENCOUNTER — Inpatient Hospital Stay
Admission: EM | Admit: 2015-07-08 | Discharge: 2015-07-10 | DRG: 293 | Disposition: A | Discharge status: Home / Self Care | Payer: Self-pay | Attending: Internal Medicine | Admitting: Internal Medicine

## 2015-07-08 DIAGNOSIS — Z79899 Other long term (current) drug therapy: Secondary | ICD-10-CM

## 2015-07-08 DIAGNOSIS — F329 Major depressive disorder, single episode, unspecified: Secondary | ICD-10-CM | POA: Diagnosis present

## 2015-07-08 DIAGNOSIS — E785 Hyperlipidemia, unspecified: Secondary | ICD-10-CM | POA: Diagnosis present

## 2015-07-08 DIAGNOSIS — G4733 Obstructive sleep apnea (adult) (pediatric): Secondary | ICD-10-CM | POA: Diagnosis present

## 2015-07-08 DIAGNOSIS — Z87891 Personal history of nicotine dependence: Secondary | ICD-10-CM

## 2015-07-08 DIAGNOSIS — I5033 Acute on chronic diastolic (congestive) heart failure: Secondary | ICD-10-CM | POA: Diagnosis present

## 2015-07-08 DIAGNOSIS — I110 Hypertensive heart disease with heart failure: Principal | ICD-10-CM | POA: Diagnosis present

## 2015-07-08 DIAGNOSIS — Z9889 Other specified postprocedural states: Secondary | ICD-10-CM

## 2015-07-08 DIAGNOSIS — R0902 Hypoxemia: Secondary | ICD-10-CM | POA: Diagnosis present

## 2015-07-08 DIAGNOSIS — Z809 Family history of malignant neoplasm, unspecified: Secondary | ICD-10-CM

## 2015-07-08 DIAGNOSIS — I5043 Acute on chronic combined systolic (congestive) and diastolic (congestive) heart failure: Secondary | ICD-10-CM | POA: Diagnosis present

## 2015-07-08 DIAGNOSIS — Z8249 Family history of ischemic heart disease and other diseases of the circulatory system: Secondary | ICD-10-CM

## 2015-07-08 DIAGNOSIS — E876 Hypokalemia: Secondary | ICD-10-CM | POA: Diagnosis present

## 2015-07-08 DIAGNOSIS — I10 Essential (primary) hypertension: Secondary | ICD-10-CM

## 2015-07-08 DIAGNOSIS — I509 Heart failure, unspecified: Secondary | ICD-10-CM

## 2015-07-08 DIAGNOSIS — I429 Cardiomyopathy, unspecified: Secondary | ICD-10-CM | POA: Diagnosis present

## 2015-07-08 DIAGNOSIS — E119 Type 2 diabetes mellitus without complications: Secondary | ICD-10-CM | POA: Diagnosis present

## 2015-07-08 DIAGNOSIS — Z7982 Long term (current) use of aspirin: Secondary | ICD-10-CM

## 2015-07-08 DIAGNOSIS — Z833 Family history of diabetes mellitus: Secondary | ICD-10-CM

## 2015-07-08 LAB — CBC
HCT: 36.9 % — ABNORMAL LOW (ref 40.0–52.0)
Hemoglobin: 12.3 g/dL — ABNORMAL LOW (ref 13.0–18.0)
MCH: 27.5 pg (ref 26.0–34.0)
MCHC: 33.2 g/dL (ref 32.0–36.0)
MCV: 82.6 fL (ref 80.0–100.0)
Platelets: 237 10*3/uL (ref 150–440)
RBC: 4.47 MIL/uL (ref 4.40–5.90)
RDW: 17.4 % — AB (ref 11.5–14.5)
WBC: 10.7 10*3/uL — ABNORMAL HIGH (ref 3.8–10.6)

## 2015-07-08 LAB — BASIC METABOLIC PANEL
Anion gap: 8 (ref 5–15)
BUN: 14 mg/dL (ref 6–20)
CALCIUM: 9.2 mg/dL (ref 8.9–10.3)
CO2: 27 mmol/L (ref 22–32)
Chloride: 104 mmol/L (ref 101–111)
Creatinine, Ser: 1.29 mg/dL — ABNORMAL HIGH (ref 0.61–1.24)
GFR calc Af Amer: >60 mL/min (ref >60)
GLUCOSE: 196 mg/dL — AB (ref 65–99)
Potassium: 3.1 mmol/L — ABNORMAL LOW (ref 3.5–5.1)
Sodium: 139 mmol/L (ref 135–145)

## 2015-07-08 LAB — GLUCOSE, CAPILLARY
GLUCOSE-CAPILLARY: 139 mg/dL — AB (ref 65–99)
GLUCOSE-CAPILLARY: 147 mg/dL — AB (ref 65–99)
Glucose-Capillary: 111 mg/dL — ABNORMAL HIGH (ref 65–99)

## 2015-07-08 LAB — TROPONIN I
TROPONIN I: 0.09 ng/mL — AB (ref <0.031)
TROPONIN I: 0.07 ng/mL — AB (ref <0.031)
TROPONIN I: 0.07 ng/mL — AB (ref <0.031)
Troponin I: 0.07 ng/mL — ABNORMAL HIGH (ref <0.031)

## 2015-07-08 LAB — BRAIN NATRIURETIC PEPTIDE: B NATRIURETIC PEPTIDE 5: 561 pg/mL — AB (ref 0.0–100.0)

## 2015-07-08 MED ORDER — ISOSORB DINITRATE-HYDRALAZINE 20-37.5 MG PO TABS
1.0000 | ORAL_TABLET | Freq: Three times a day (TID) | ORAL | Status: DC
  Filled 2015-07-08: qty 1

## 2015-07-08 MED ORDER — NITROGLYCERIN 2 % TD OINT
1.0000 [in_us] | TOPICAL_OINTMENT | Freq: Once | TRANSDERMAL | Status: AC
  Administered 2015-07-08: 1 [in_us] via TOPICAL
  Filled 2015-07-08: qty 1

## 2015-07-08 MED ORDER — FUROSEMIDE 10 MG/ML IJ SOLN
20.0000 mg | Freq: Three times a day (TID) | INTRAMUSCULAR | Status: DC
  Administered 2015-07-08 – 2015-07-10 (×6): 20 mg via INTRAVENOUS
  Filled 2015-07-08 (×6): qty 2

## 2015-07-08 MED ORDER — HYDRALAZINE HCL 25 MG PO TABS
37.5000 mg | ORAL_TABLET | Freq: Three times a day (TID) | ORAL | Status: DC
  Administered 2015-07-08 – 2015-07-10 (×7): 37.5 mg via ORAL
  Filled 2015-07-08: qty 2
  Filled 2015-07-08: qty 1
  Filled 2015-07-08: qty 2
  Filled 2015-07-08: qty 1
  Filled 2015-07-08 (×4): qty 2

## 2015-07-08 MED ORDER — CARVEDILOL 12.5 MG PO TABS
12.5000 mg | ORAL_TABLET | Freq: Two times a day (BID) | ORAL | Status: DC
  Administered 2015-07-08 – 2015-07-10 (×4): 12.5 mg via ORAL
  Filled 2015-07-08 (×4): qty 1

## 2015-07-08 MED ORDER — ALBUTEROL SULFATE (2.5 MG/3ML) 0.083% IN NEBU: 3.0000 mL | INHALATION_SOLUTION | Freq: Four times a day (QID) | RESPIRATORY_TRACT | Status: DC | PRN

## 2015-07-08 MED ORDER — LISINOPRIL 20 MG PO TABS
20.0000 mg | ORAL_TABLET | Freq: Every day | ORAL | Status: DC
  Administered 2015-07-08 – 2015-07-10 (×3): 20 mg via ORAL
  Filled 2015-07-08 (×3): qty 1

## 2015-07-08 MED ORDER — SIMVASTATIN 40 MG PO TABS
40.0000 mg | ORAL_TABLET | Freq: Every day | ORAL | Status: DC
  Administered 2015-07-08 – 2015-07-10 (×3): 40 mg via ORAL
  Filled 2015-07-08 (×3): qty 1

## 2015-07-08 MED ORDER — ASPIRIN 81 MG PO CHEW
324.0000 mg | CHEWABLE_TABLET | Freq: Once | ORAL | Status: AC
  Administered 2015-07-08: 324 mg via ORAL
  Filled 2015-07-08: qty 4

## 2015-07-08 MED ORDER — NITROGLYCERIN 0.4 MG SL SUBL
0.4000 mg | SUBLINGUAL_TABLET | Freq: Once | SUBLINGUAL | Status: AC
  Administered 2015-07-08: 0.4 mg via SUBLINGUAL
  Filled 2015-07-08: qty 1

## 2015-07-08 MED ORDER — FUROSEMIDE 10 MG/ML IJ SOLN
80.0000 mg | Freq: Once | INTRAMUSCULAR | Status: AC
  Administered 2015-07-08: 80 mg via INTRAVENOUS
  Filled 2015-07-08: qty 8

## 2015-07-08 MED ORDER — POTASSIUM CHLORIDE CRYS ER 20 MEQ PO TBCR
40.0000 meq | EXTENDED_RELEASE_TABLET | Freq: Two times a day (BID) | ORAL | Status: DC
  Administered 2015-07-08 – 2015-07-10 (×5): 40 meq via ORAL
  Filled 2015-07-08 (×6): qty 2

## 2015-07-08 MED ORDER — ENOXAPARIN SODIUM 150 MG/ML ~~LOC~~ SOLN
1.0000 mg/kg | Freq: Once | SUBCUTANEOUS | Status: DC
  Filled 2015-07-08: qty 0.91

## 2015-07-08 MED ORDER — INSULIN ASPART 100 UNIT/ML ~~LOC~~ SOLN
0.0000 [IU] | Freq: Three times a day (TID) | SUBCUTANEOUS | Status: DC
  Administered 2015-07-08 – 2015-07-09 (×2): 1 [IU] via SUBCUTANEOUS
  Administered 2015-07-09 – 2015-07-10 (×4): 2 [IU] via SUBCUTANEOUS
  Filled 2015-07-08: qty 1
  Filled 2015-07-08: qty 2
  Filled 2015-07-08: qty 1
  Filled 2015-07-08 (×3): qty 2

## 2015-07-08 MED ORDER — ASPIRIN EC 81 MG PO TBEC
81.0000 mg | DELAYED_RELEASE_TABLET | Freq: Every day | ORAL | Status: DC
  Administered 2015-07-09 – 2015-07-10 (×2): 81 mg via ORAL
  Filled 2015-07-08 (×2): qty 1

## 2015-07-08 MED ORDER — ISOSORBIDE DINITRATE 20 MG PO TABS
20.0000 mg | ORAL_TABLET | Freq: Three times a day (TID) | ORAL | Status: DC
  Administered 2015-07-08 – 2015-07-10 (×7): 20 mg via ORAL
  Filled 2015-07-08 (×7): qty 1

## 2015-07-08 MED ORDER — HEPARIN SODIUM (PORCINE) 5000 UNIT/ML IJ SOLN
5000.0000 [IU] | Freq: Three times a day (TID) | INTRAMUSCULAR | Status: DC
  Filled 2015-07-08 (×2): qty 1

## 2015-07-08 MED ORDER — SODIUM CHLORIDE 0.9% FLUSH
3.0000 mL | Freq: Two times a day (BID) | INTRAVENOUS | Status: DC
  Administered 2015-07-08 – 2015-07-10 (×7): 3 mL via INTRAVENOUS

## 2015-07-08 NOTE — H&P (Signed)
[facility] [place] NAME: Bradley Mckenzie    MR#:  [id number]  DATE OF BIRTH:  [birth year]  DATE OF ADMISSION:  07/08/2015  PRIMARY CARE PHYSICIAN: Nat Christen, PA-C   REQUESTING/REFERRING PHYSICIAN: Jimmye Norman  CHIEF COMPLAINT:   Chief Complaint  Patient presents with  . Shortness of Breath    HISTORY OF PRESENT ILLNESS: Bradley Mckenzie  is a 50 y.o. male with a known history of Hypertensive heart disease, combined systolic and end-diastolic heart failure, diabetes, or sleep apnea, morbid obesity, hyperlipidemia- was admitted for congestive heart failure in March 2017 and was given prescription for few days after discharge. He was also advised to have follow-up in cardiology clinic and stress test after discharge. He lost his job and so his insurance so he could not follow up with any clinics and he also did not fill up his prescriptions after he finished medication from discharge for last 2 months he is not taking any of the medications. He came to the hospital now as for last 2 weeks he has been having worsening swelling on his legs and he has to sleep in the recliner because of shortness of breath at night. Troponin is borderline elevated and some pulmonary edema on exam and his blood pressure is elevated so given his admission to hospitalist team.  PAST MEDICAL HISTORY:   Past Medical History  Diagnosis Date  . Hypertensive heart disease     a. Since his 53's.  . Chronic combined systolic (congestive) and diastolic (congestive) heart failure ([place])     a. 01/2014 Echo: EF 25-30%. b. Echo 03/2015: Improved EF of 40-45%  . Diabetes mellitus without complication ([place])     a. Not previously on outpt meds.  . Obstructive sleep apnea     a. Has not used CPAP since ~ 2012.  . Depression   . Morbid obesity ([place])   . Hyperlipidemia   . Cardiomyopathy ([place])     a. 01/2014 Echo: EF 25-30%;  b. ? ischemic vs non-ischemic.  He's never had an ischemic  eval.    PAST SURGICAL HISTORY: Past Surgical History  Procedure Laterality Date  . Hernia repair  2004    SOCIAL HISTORY:  Social History  Substance Use Topics  . Smoking status: Former Research scientist (life sciences)  . Smokeless tobacco: Never Used     Comment: Quit in his 20's.  . Alcohol Use: 0.0 oz/week    0 Standard drinks or equivalent per week     Comment: occassional alcohol use at special events    FAMILY HISTORY:  Family History  Problem Relation Age of Onset  . Heart disease Mother     alive & well.  [name] Heart attack Father 35    died @ 104 of cancer.  . Diabetes Father   . Cancer Father   . Cancer Sister     DRUG ALLERGIES: No Known Allergies  REVIEW OF SYSTEMS:   CONSTITUTIONAL: No fever, fatigue or weakness.  EYES: No blurred or double vision.  EARS, NOSE, AND THROAT: No tinnitus or ear pain.  RESPIRATORY: No cough, Positive for shortness of breath, wheezing or hemoptysis.  CARDIOVASCULAR: No chest pain, positive for orthopnea, edema.  GASTROINTESTINAL: No nausea, vomiting, diarrhea or abdominal pain.  GENITOURINARY: No dysuria, hematuria.  ENDOCRINE: No polyuria, nocturia,  HEMATOLOGY: No anemia, easy bruising or bleeding SKIN: No rash or lesion. MUSCULOSKELETAL: No joint pain or arthritis.   NEUROLOGIC: No tingling, numbness, weakness.  PSYCHIATRY: No anxiety  or depression.   MEDICATIONS AT HOME:  Prior to Admission medications   Medication Sig Start Date End Date Taking? Authorizing Provider  albuterol (PROVENTIL HFA;VENTOLIN HFA) 108 (90 BASE) MCG/ACT inhaler Inhale 2 puffs into the lungs every 6 (six) hours as needed for wheezing or shortness of breath.    Historical Provider, MD  aspirin EC 81 MG tablet Take 81 mg by mouth daily.    Historical Provider, MD  carvedilol (COREG) 12.5 MG tablet Take 1 tablet (12.5 mg total) by mouth 2 (two) times daily with a meal. 03/31/15   Dustin Flock, MD  esomeprazole (NEXIUM) 40 MG capsule Take 1 capsule (40 mg total) by mouth  daily. 12/15/14 12/15/15  Wandra Arthurs, MD  famotidine (PEPCID) 20 MG tablet Take 1 tablet (20 mg total) by mouth 2 (two) times daily as needed for heartburn or indigestion. 12/15/14 12/15/15  Wandra Arthurs, MD  isosorbide-hydrALAZINE (BIDIL) 20-37.5 MG tablet Take 1 tablet by mouth 3 (three) times daily. 01/20/15   Alisa Graff, FNP  lisinopril (PRINIVIL,ZESTRIL) 20 MG tablet Take 1 tablet (20 mg total) by mouth daily. 09/13/14   Alisa Graff, FNP  potassium chloride SA (K-DUR,KLOR-CON) 20 MEQ tablet Take 1 tablet (20 mEq total) by mouth 2 (two) times daily. 01/30/15   Alisa Graff, FNP  simvastatin (ZOCOR) 40 MG tablet Take 1 tablet (40 mg total) by mouth daily. 07/29/14   Alisa Graff, FNP  torsemide (DEMADEX) 20 MG tablet Take 2 tablets (40 mg total) by mouth 2 (two) times daily. 01/30/15   Alisa Graff, FNP      PHYSICAL EXAMINATION:   VITAL SIGNS: Blood pressure 191/109, pulse 103, temperature 98.6 F (37 C), temperature source Oral, resp. rate 23, height 5\' 9"  (1.753 m), weight 136.079 kg (300 lb), SpO2 92 %.  GENERAL:  50 y.o.-year-old patient-year-old patient lying in the bed with no acute distress.  EYES: Pupils equal, round, reactive to light and accommodation. No scleral icterus. Extraocular muscles intact.  HEENT: Head atraumatic, normocephalic. Oropharynx and nasopharynx clear.  NECK:  Supple, no jugular venous distention. No thyroid enlargement, no tenderness.  LUNGS: Normal breath sounds bilaterally, no wheezing, some crepitation. No use of accessory muscles of respiration.  CARDIOVASCULAR: S1, S2 normal. No murmurs, rubs, or gallops.  ABDOMEN: Soft, nontender, nondistended. Bowel sounds present. No organomegaly or mass.  EXTREMITIES: Positive bilateral pedal edema, no cyanosis, or clubbing.  NEUROLOGIC: Cranial nerves II through XII are intact. Muscle strength 5/5 in all extremities. Sensation intact. Gait not checked.  PSYCHIATRIC: The patient is alert and oriented x 3.  SKIN: No obvious rash,  lesion, or ulcer.   LABORATORY PANEL:   CBC  Recent Labs Lab 07/08/15 0636  WBC 10.7*  HGB 12.3*  HCT 36.9*  PLT 237  MCV 82.6  MCH 27.5  MCHC 33.2  RDW 17.4*   ------------------------------------------------------------------------------------------------------------------  Chemistries   Recent Labs Lab 07/08/15 0636  NA 139  K 3.1*  CL 104  CO2 27  GLUCOSE 196*  BUN 14  CREATININE 1.29*  CALCIUM 9.2   ------------------------------------------------------------------------------------------------------------------ estimated creatinine clearance is 96 mL/min (by C-G formula based on Cr of 1.29). ------------------------------------------------------------------------------------------------------------------ No results for input(s): TSH, T4TOTAL, T3FREE, THYROIDAB in the last 72 hours.  Invalid input(s): FREET3   Coagulation profile No results for input(s): INR, PROTIME in the last 168 hours. ------------------------------------------------------------------------------------------------------------------- No results for input(s): DDIMER in the last 72 hours. -------------------------------------------------------------------------------------------------------------------  Cardiac Enzymes  Recent Labs Lab 07/08/15 0636  TROPONINI  0.09*   ------------------------------------------------------------------------------------------------------------------ Invalid input(s): POCBNP  ---------------------------------------------------------------------------------------------------------------  Urinalysis    Component Value Date/Time   COLORURINE YELLOW* 08/08/2014 0541   APPEARANCEUR CLEAR* 08/08/2014 0541   LABSPEC 1.016 08/08/2014 0541   PHURINE 5.0 08/08/2014 0541   GLUCOSEU >500* 08/08/2014 0541   HGBUR NEGATIVE 08/08/2014 0541   BILIRUBINUR NEGATIVE 08/08/2014 0541   KETONESUR NEGATIVE 08/08/2014 0541   PROTEINUR NEGATIVE 08/08/2014 0541    NITRITE NEGATIVE 08/08/2014 0541   LEUKOCYTESUR NEGATIVE 08/08/2014 0541     RADIOLOGY: Dg Chest 2 View  07/08/2015  CLINICAL DATA:  Shortness of breath EXAM: CHEST  2 VIEW COMPARISON:  03/27/2015 chest radiograph. FINDINGS: Stable cardiomediastinal silhouette with mild cardiomegaly. No pneumothorax. Trace right pleural effusion. No left pleural effusion. Hazy and linear parahilar bilateral lung opacities. IMPRESSION: Stable mild cardiomegaly. Diffuse hazy and linear parahilar lung opacities, favor mild-to-moderate pulmonary edema due to congestive heart failure. Trace right pleural effusion. Electronically Signed   By: Ilona Sorrel M.D.   On: 07/08/2015 07:18    EKG: Orders placed or performed during the hospital encounter of 07/08/15  . ED EKG  . ED EKG    IMPRESSION AND PLAN:  * Acute on chronic combined systolic and diastolic heart failure   IV Lasix, fluid restriction, daily weight, intake and output measurement, cardiac monitoring.   He had echocardiogram recently during last admitted so will not repeat it.   As he lost his insurance and his job will need some financial assistance during this admission.  * Uncontrolled hypertension   We will resume the medication he was supposed to take including beta blocker and diuretics and ACE inhibitor.  * Diabetes   Keep on insulin sliding scale coverage.  * Hypokalemia   Replace orally and monitor with diuretics use.  All the records are reviewed and case discussed with ED provider. Management plans discussed with the patient, family and they are in agreement.  CODE STATUS: Full code Code Status History    Date Active Date Inactive Code Status Order ID Comments User Context   03/27/2015  1:46 PM 03/31/2015  4:05 PM Full Code [id number]  Henreitta Leber, MD ED   08/07/2014  9:00 PM 08/08/2014  6:19 PM Full Code [id number]  Baxter Hire, MD Inpatient       TOTAL TIME TAKING CARE OF THIS PATIENT: 50 minutes.    Vaughan Basta M.D on 07/08/2015   Between 7am to 6pm - Pager - [phone]  After 6pm go to [email or web address] - password [email or web address] [facility]  Office  [phone]  CC: Primary care physician; Nat Christen, PA-C   Note: This dictation was prepared with Dragon dictation along with smaller phrase technology. Any transcriptional errors that result from this process are unintentional.

## 2015-07-08 NOTE — ED Notes (Signed)
Patient reports having shortness of breath, "I know what it is.  I haven't been able to afford my medicine and I'm building up fluid."  Patient reports history of CHF.

## 2015-07-08 NOTE — ED Provider Notes (Signed)
[facility] Emergency Department Provider Note        Time seen: ----------------------------------------- 7:24 AM on 07/08/2015 -----------------------------------------    I have reviewed the triage vital signs and the nursing notes.   HISTORY  Chief Complaint Shortness of Breath    HPI Bradley Mckenzie. is a 50 y.o. male who presents to ER for shortness of breath. Shortness of breath is worse when he is lying flat and when he is trying to sleep. Patient states he knows what it is, he has been off his medicines for 2 months at least and he knows he has fluid building up. He does have history of CHF. He states he could not get his medications filled and if he is discharged today he still can get them filled. Patient states is not working right now, he denies any chest pain just has worsening shortness of breath. He feels like he has gained weight and put on extra fluid as well.   Past Medical History  Diagnosis Date  . Hypertensive heart disease     a. Since his 52's.  . Chronic combined systolic (congestive) and diastolic (congestive) heart failure ([place])     a. 01/2014 Echo: EF 25-30%. b. Echo 03/2015: Improved EF of 40-45%  . Diabetes mellitus without complication ([place])     a. Not previously on outpt meds.  . Obstructive sleep apnea     a. Has not used CPAP since ~ 2012.  . Depression   . Morbid obesity ([place])   . Hyperlipidemia   . Cardiomyopathy ([place])     a. 01/2014 Echo: EF 25-30%;  b. ? ischemic vs non-ischemic.  He's never had an ischemic eval.    Patient Active Problem List   Diagnosis Date Noted  . CHF (congestive heart failure) ([place]) 03/27/2015  . Acute on chronic combined systolic (congestive) and diastolic (congestive) heart failure ([place]) 03/27/2015  . Cardiomyopathy ([place])   . Hyperlipidemia   . Morbid obesity ([place])   . Chronic combined systolic (congestive) and diastolic (congestive) heart failure ([place])   . Diabetes mellitus without  complication ([place])   . Hypertensive heart disease   . Acute on chronic systolic heart failure ([place]) 01/20/2015  . Sinusitis 11/11/2014  . Tachycardia 09/12/2014  . Acute renal failure ([place]) 08/07/2014  . COPD (chronic obstructive pulmonary disease) ([place]) 08/05/2014  . Chronic systolic heart failure ([place]) 06/16/2014  . Obstructive sleep apnea 06/16/2014  . Essential hypertension 06/16/2014  . H/O respiratory system disease 02/24/2014    Past Surgical History  Procedure Laterality Date  . Hernia repair  2004    Allergies Review of patient's allergies indicates no known allergies.  Social History Social History  Substance Use Topics  . Smoking status: Former Research scientist (life sciences)  . Smokeless tobacco: Never Used     Comment: Quit in his 20's.  . Alcohol Use: 0.0 oz/week    0 Standard drinks or equivalent per week     Comment: occassional alcohol use at special events    Review of Systems Constitutional: Negative for fever. Cardiovascular: Negative for chest pain. Respiratory: Positive shortness of breath Gastrointestinal: Negative for abdominal pain, vomiting and diarrhea. Genitourinary: Negative for dysuria. Musculoskeletal: Negative for back pain. Skin: Negative for rash. Neurological: Negative for headaches, focal weakness or numbness.  10-point ROS otherwise negative.  ____________________________________________   PHYSICAL EXAM:  VITAL SIGNS: ED Triage Vitals  Enc Vitals Group     BP 07/08/15 0610 183/97 mmHg     Pulse Rate 07/08/15  0610 104     Resp 07/08/15 0610 22     Temp 07/08/15 0610 98.6 F (37 C)     Temp Source 07/08/15 0610 Oral     SpO2 07/08/15 0610 92 %     Weight 07/08/15 0610 300 lb (136.079 kg)     Height 07/08/15 0610 5\' 9"  (1.753 m)     Head Cir --      Peak Flow --      Pain Score --      Pain Loc --      Pain Edu? --      Excl. in [place]? --     Constitutional: Alert and oriented. Mild distress Eyes: Conjunctivae are normal. PERRL. Normal  extraocular movements. ENT   Head: Normocephalic and atraumatic.   Nose: No congestion/rhinnorhea.   Mouth/Throat: Mucous membranes are moist.   Neck: No stridor. Cardiovascular: Rapid rate, regular rhythm. No murmurs, rubs, or gallops. Respiratory: Tachypnea with grossly clear breath sounds bilaterally. Gastrointestinal: Soft and nontender. Normal bowel sounds Musculoskeletal: Nontender with normal range of motion in all extremities. Bilateral pitting edema Neurologic:  Normal speech and language. No gross focal neurologic deficits are appreciated.  Skin:  Skin is warm, dry and intact. No rash noted. Psychiatric: Mood and affect are normal. Speech and behavior are normal.  ____________________________________________  EKG: Interpreted by me. Sinus tachycardia with a rate of 103 bpm, normal PR interval, normal QRS width, normal QT interval. Left axis deviation, lateral T wave inversion.  ____________________________________________  ED COURSE:  Pertinent labs & imaging results that were available during my care of the patient were reviewed by me and considered in my medical decision making (see chart for details). Patient presents to ER with likely volume overload from being out of his medicines, particularly his Demadex. I will restart his medications including aspirin, nitroglycerin, Lasix, and he will receive a dose of Lovenox. Patient will likely need admission for diuresis. ____________________________________________    LABS (pertinent positives/negatives)  Labs Reviewed  BASIC METABOLIC PANEL - Abnormal; Notable for the following:    Potassium 3.1 (*)    Glucose, Bld 196 (*)    Creatinine, Ser 1.29 (*)    All other components within normal limits  CBC - Abnormal; Notable for the following:    WBC 10.7 (*)    Hemoglobin 12.3 (*)    HCT 36.9 (*)    RDW 17.4 (*)    All other components within normal limits  TROPONIN I - Abnormal; Notable for the following:     Troponin I 0.09 (*)    All other components within normal limits    RADIOLOGY Images were viewed by me  Chest x-ray IMPRESSION: Stable mild cardiomegaly. Diffuse hazy and linear parahilar lung opacities, favor mild-to-moderate pulmonary edema due to congestive heart failure.  Trace right pleural effusion. ____________________________________________  FINAL ASSESSMENT AND PLAN  CHF exacerbation  Plan: Patient with labs and imaging as dictated above. Patient's been started on 80 mg of Lasix as well as other medications as dictated above. He has diuresed some but does not feel significantly better. I will discuss with the hospitalist for admission.   Earleen [place], MD   Note: This dictation was prepared with Dragon dictation. Any transcriptional errors that result from this process are unintentional   Earleen [place], MD 07/08/15 [phone]

## 2015-07-08 NOTE — ED Notes (Signed)
Dr. Williams notified of critical trop 

## 2015-07-09 ENCOUNTER — Inpatient Hospital Stay: Payer: Self-pay

## 2015-07-09 LAB — CBC
HEMATOCRIT: 35.5 % — AB (ref 40.0–52.0)
Hemoglobin: 11.7 g/dL — ABNORMAL LOW (ref 13.0–18.0)
MCH: 27.2 pg (ref 26.0–34.0)
MCHC: 32.9 g/dL (ref 32.0–36.0)
MCV: 82.6 fL (ref 80.0–100.0)
Platelets: 214 10*3/uL (ref 150–440)
RBC: 4.3 MIL/uL — ABNORMAL LOW (ref 4.40–5.90)
RDW: 17.2 % — AB (ref 11.5–14.5)
WBC: 11.8 10*3/uL — ABNORMAL HIGH (ref 3.8–10.6)

## 2015-07-09 LAB — GLUCOSE, CAPILLARY
GLUCOSE-CAPILLARY: 150 mg/dL — AB (ref 65–99)
GLUCOSE-CAPILLARY: 174 mg/dL — AB (ref 65–99)
GLUCOSE-CAPILLARY: 153 mg/dL — AB (ref 65–99)
Glucose-Capillary: 151 mg/dL — ABNORMAL HIGH (ref 65–99)

## 2015-07-09 LAB — BASIC METABOLIC PANEL
Anion gap: 10 (ref 5–15)
BUN: 20 mg/dL (ref 6–20)
CALCIUM: 9.1 mg/dL (ref 8.9–10.3)
CO2: 26 mmol/L (ref 22–32)
Chloride: 104 mmol/L (ref 101–111)
Creatinine, Ser: 1.34 mg/dL — ABNORMAL HIGH (ref 0.61–1.24)
GFR calc Af Amer: >60 mL/min (ref >60)
GLUCOSE: 132 mg/dL — AB (ref 65–99)
Potassium: 3.9 mmol/L (ref 3.5–5.1)
Sodium: 140 mmol/L (ref 135–145)

## 2015-07-09 NOTE — Progress Notes (Signed)
Handoff from [employer]. Patient resting quietly with visitors at bedside. No distress or complaints. Will continue to monitor.

## 2015-07-09 NOTE — Progress Notes (Signed)
SATURATION QUALIFICATIONS: (This note is used to comply with regulatory documentation for home oxygen)  Patient Saturations on Room Air at Rest = 94%  Patient Saturations on Room Air while Ambulating = 88%  Patient Saturations on 2 Liters of oxygen while Ambulating = 90%  Please briefly explain why patient needs home oxygen: 

## 2015-07-09 NOTE — Progress Notes (Signed)
[place] [facility] [place] NAME: Bradley Mckenzie    MR#:  [id number]  DATE OF BIRTH:  19-Jun-1965  SUBJECTIVE:  CHIEF COMPLAINT:   Chief Complaint  Patient presents with  . Shortness of Breath    came with SOB for few weeks, not taking his meds.   Have good diuresis, and feels better now.   Have hypoxia on exertion.  REVIEW OF SYSTEMS:  CONSTITUTIONAL: No fever, fatigue or weakness.  EYES: No blurred or double vision.  EARS, NOSE, AND THROAT: No tinnitus or ear pain.  RESPIRATORY: No cough, shortness of breath, wheezing or hemoptysis.  CARDIOVASCULAR: No chest pain, orthopnea, edema.  GASTROINTESTINAL: No nausea, vomiting, diarrhea or abdominal pain.  GENITOURINARY: No dysuria, hematuria.  ENDOCRINE: No polyuria, nocturia,  HEMATOLOGY: No anemia, easy bruising or bleeding SKIN: No rash or lesion. MUSCULOSKELETAL: No joint pain or arthritis.   NEUROLOGIC: No tingling, numbness, weakness.  PSYCHIATRY: No anxiety or depression.   ROS  DRUG ALLERGIES:  No Known Allergies  VITALS:  Blood pressure 113/65, pulse 82, temperature 97.7 F (36.5 C), temperature source Oral, resp. rate 20, height 5\' 9"  (1.753 m), weight 135.036 kg (297 lb 11.2 oz), SpO2 96 %.  PHYSICAL EXAMINATION:  GENERAL:  50 y.o.-year-old patient lying in the bed with no acute distress.  EYES: Pupils equal, round, reactive to light and accommodation. No scleral icterus. Extraocular muscles intact.  HEENT: Head atraumatic, normocephalic. Oropharynx and nasopharynx clear.  NECK:  Supple, no jugular venous distention. No thyroid enlargement, no tenderness.  LUNGS: Normal breath sounds bilaterally, no wheezing, rales,rhonchi or crepitation. No use of accessory muscles of respiration.  CARDIOVASCULAR: S1, S2 normal. No murmurs, rubs, or gallops.  ABDOMEN: Soft, nontender, nondistended. Bowel sounds present. No organomegaly or mass.  EXTREMITIES: No pedal edema, cyanosis, or  clubbing.  NEUROLOGIC: Cranial nerves II through XII are intact. Muscle strength 5/5 in all extremities. Sensation intact. Gait not checked.  PSYCHIATRIC: The patient is alert and oriented x 3.  SKIN: No obvious rash, lesion, or ulcer.   Physical Exam LABORATORY PANEL:   CBC  Recent Labs Lab 07/09/15 0435  WBC 11.8*  HGB 11.7*  HCT 35.5*  PLT 214   ------------------------------------------------------------------------------------------------------------------  Chemistries   Recent Labs Lab 07/09/15 0435  NA 140  K 3.9  CL 104  CO2 26  GLUCOSE 132*  BUN 20  CREATININE 1.34*  CALCIUM 9.1   ------------------------------------------------------------------------------------------------------------------  Cardiac Enzymes  Recent Labs Lab 07/08/15 1314 07/08/15 1653  TROPONINI 0.07* 0.07*   ------------------------------------------------------------------------------------------------------------------  RADIOLOGY:  Dg Chest 2 View  07/09/2015  CLINICAL DATA:  Acute on chronic CHF EXAM: CHEST  2 VIEW COMPARISON:  Chest radiograph from one day prior. FINDINGS: Stable cardiomediastinal silhouette with mild cardiomegaly. No pneumothorax. No pleural effusion. No appreciable change in mild to moderate pulmonary edema. IMPRESSION: Stable mild-to-moderate congestive heart failure. Electronically Signed   By: Bradley Mckenzie M.D.   On: 07/09/2015 10:56   Dg Chest 2 View  07/08/2015  CLINICAL DATA:  Shortness of breath EXAM: CHEST  2 VIEW COMPARISON:  03/27/2015 chest radiograph. FINDINGS: Stable cardiomediastinal silhouette with mild cardiomegaly. No pneumothorax. Trace right pleural effusion. No left pleural effusion. Hazy and linear parahilar bilateral lung opacities. IMPRESSION: Stable mild cardiomegaly. Diffuse hazy and linear parahilar lung opacities, favor mild-to-moderate pulmonary edema due to congestive heart failure. Trace right pleural effusion. Electronically Signed    By: Bradley Mckenzie M.D.   On: 07/08/2015 07:18  ASSESSMENT AND PLAN:   Principal Problem:   Acute on chronic combined systolic (congestive) and diastolic (congestive) heart failure (HCC) Active Problems:   Uncontrolled hypertension   Acute on chronic diastolic CHF (congestive heart failure), NYHA class 1 (HCC)  * Acute on chronic combined systolic and diastolic heart failure  IV Lasix, fluid restriction, daily weight, intake and output measurement, cardiac monitoring.  He had echocardiogram recently during last admitted so will not repeat it.  As he lost his insurance and his job will need some financial assistance during this admission.  * Uncontrolled hypertension  resume the medication he was supposed to take including beta blocker and diuretics and ACE inhibitor.  * Diabetes  Keep on insulin sliding scale coverage.  * Hypokalemia  Replace orally and monitor with diuretics use. Stable.  * Hypoxia   Will need home O2- spoke to Case manager to arrange.  * Sleep apnea   Was using cpap, but stopped using as his machine broke.   Given prescription to case manager to get a new machine for him.   All the records are reviewed and case discussed with Care Management/Social Workerr. Management plans discussed with the patient, family and they are in agreement.  CODE STATUS: full  TOTAL TIME TAKING CARE OF THIS PATIENT: 36 minutes.     POSSIBLE D/C IN 1 DAYS, DEPENDING ON CLINICAL CONDITION.   Bradley Mckenzie M.D on 07/09/2015   Between 7am to 6pm - Pager - [phone]  After 6pm go to [email or web address] - password [email or web address] [facility]  Office  [phone]  CC: Primary care physician; Bradley Christen, PA-C  Note: This dictation was prepared with Dragon dictation along with smaller phrase technology. Any transcriptional errors that result from this process are unintentional.

## 2015-07-09 NOTE — Care Management Note (Addendum)
Case Management Note  Patient Details  Name: Bradley Mckenzie. MRN: [id number] Date of Birth: 10/14/1965  Subjective/Objective:    Attempted to discuss issues with his home CPAP machine which Bradley Mckenzie reports is 49 years old. Per Dr Anselm Jungling Bradley Mckenzie cannot be discharged home without a useable CPAP machine. Bradley Mckenzie states that he had a sleep study last July 2016 "right across the street from this hospital." He does not recall the name of the ordering physician. Bradley Mckenzie qualified for home oxygen on 07/09/15. Bradley Mckenzie is uninsured and will need a [employer]ly new home oxygen. Text message sent to [facility] to please [facility] authorization for new oxygen and CPAP, and to try to locate a copy of the sleep study done July 2016. Per [facility], a sleep study done within the last 12 months will qualify for a CPAP machine.     Action/Plan:   Expected Discharge Date:                  Expected Discharge Plan:     In-House Referral:     Discharge planning Services     Post Acute Care Choice:    Choice offered to:     DME Arranged:    DME Agency:     HH Arranged:    HH Agency:     Status of Service:     If discussed at [employer] of Stay Meetings, dates discussed:    Additional Comments:  Talyia Allende A, RN 07/09/2015, 12:10 PM

## 2015-07-09 NOTE — Progress Notes (Signed)
Pt remained in recliner throughout shift. Pt denied pain. Up ad lib in room. VS stable afebrile. Tele NSR. Remains SOB on exertion.  Pt refused SCD's and Heparin injections. Education was provided to pt on purpose of SCD's and heparin. Pt instructed on 1500 ml fluid restriction.

## 2015-07-10 LAB — GLUCOSE, CAPILLARY
GLUCOSE-CAPILLARY: 155 mg/dL — AB (ref 65–99)
Glucose-Capillary: 198 mg/dL — ABNORMAL HIGH (ref 65–99)

## 2015-07-10 MED ORDER — POTASSIUM CHLORIDE CRYS ER 20 MEQ PO TBCR: 20.0000 meq | EXTENDED_RELEASE_TABLET | Freq: Two times a day (BID) | ORAL | Status: DC

## 2015-07-10 MED ORDER — LISINOPRIL 20 MG PO TABS: 20.0000 mg | ORAL_TABLET | Freq: Every day | ORAL | Status: DC

## 2015-07-10 MED ORDER — ALBUTEROL SULFATE HFA 108 (90 BASE) MCG/ACT IN AERS: 2.0000 | INHALATION_SPRAY | Freq: Four times a day (QID) | RESPIRATORY_TRACT | Status: DC | PRN

## 2015-07-10 MED ORDER — CARVEDILOL 12.5 MG PO TABS: 12.5000 mg | ORAL_TABLET | Freq: Two times a day (BID) | ORAL | Status: DC

## 2015-07-10 MED ORDER — SIMVASTATIN 40 MG PO TABS: 40.0000 mg | ORAL_TABLET | Freq: Every day | ORAL | Status: DC

## 2015-07-10 MED ORDER — HYDRALAZINE HCL 25 MG PO TABS: 25.0000 mg | ORAL_TABLET | Freq: Three times a day (TID) | ORAL | Status: DC

## 2015-07-10 MED ORDER — FUROSEMIDE 40 MG PO TABS: 40.0000 mg | ORAL_TABLET | Freq: Every day | ORAL | Status: DC

## 2015-07-10 MED ORDER — ASPIRIN EC 81 MG PO TBEC: 81.0000 mg | DELAYED_RELEASE_TABLET | Freq: Every day | ORAL | Status: DC

## 2015-07-10 NOTE — Discharge Summary (Signed)
[place] [facility] [place] NAME: Bradley Mckenzie    MR#:  [id number]  DATE OF BIRTH:  01/30/1965  DATE OF ADMISSION:  07/08/2015 ADMITTING PHYSICIAN: Vaughan Basta, MD  DATE OF DISCHARGE: 07/10/2015  PRIMARY CARE PHYSICIAN: Nat Christen, PA-C    ADMISSION DIAGNOSIS:  Acute on chronic diastolic CHF (congestive heart failure), NYHA class 1 (HCC) [I50.33] Acute on chronic congestive heart failure, unspecified congestive heart failure type ([place]) [I50.9]  DISCHARGE DIAGNOSIS:  Principal Problem:   Acute on chronic combined systolic (congestive) and diastolic (congestive) heart failure (HCC) Active Problems:   Uncontrolled hypertension   Acute on chronic diastolic CHF (congestive heart failure), NYHA class 1 ([place])   SECONDARY DIAGNOSIS:   Past Medical History  Diagnosis Date  . Hypertensive heart disease     a. Since his 69's.  . Chronic combined systolic (congestive) and diastolic (congestive) heart failure ([place])     a. 01/2014 Echo: EF 25-30%. b. Echo 03/2015: Improved EF of 40-45%  . Diabetes mellitus without complication ([place])     a. Not previously on outpt meds.  . Obstructive sleep apnea     a. Has not used CPAP since ~ 2012.  . Depression   . Morbid obesity ([place])   . Hyperlipidemia   . Cardiomyopathy ([place])     a. 01/2014 Echo: EF 25-30%;  b. ? ischemic vs non-ischemic.  He's never had an ischemic eval.    HOSPITAL COURSE:   * Acute on chronic combined systolic and diastolic heart failure  IV Lasix, fluid restriction, daily weight, intake and output measurement, cardiac monitoring.  He had echocardiogram recently during last admitted so will not repeat it.  As he lost his insurance and his job will need some financial assistance during this admission.  Felt better after diuresis, case manager helped with arranging follow up and medications.  * Uncontrolled hypertension  resume the medication he was supposed to  take including beta blocker and diuretics and ACE inhibitor.   BP is better now.  * Diabetes  Keep on insulin sliding scale coverage.  * Hypokalemia  Replace orally and monitor with diuretics use. Stable.  * Hypoxia  Improved and was stable on room air on discharge.  * Sleep apnea  Was using cpap, but stopped using as his machine broke.  this was few years ago.   Advised him to follow with PMD again and set it up  DISCHARGE CONDITIONS:   Stable.  CONSULTS OBTAINED:     DRUG ALLERGIES:  No Known Allergies  DISCHARGE MEDICATIONS:   Current Discharge Medication List    START taking these medications   Details  furosemide (LASIX) 40 MG tablet Take 1 tablet (40 mg total) by mouth daily. Qty: 30 tablet, Refills: 3    hydrALAZINE (APRESOLINE) 25 MG tablet Take 1 tablet (25 mg total) by mouth 3 (three) times daily. Qty: 90 tablet, Refills: 3      CONTINUE these medications which have CHANGED   Details  albuterol (PROVENTIL HFA;VENTOLIN HFA) 108 (90 Base) MCG/ACT inhaler Inhale 2 puffs into the lungs every 6 (six) hours as needed for wheezing or shortness of breath. Reported on 07/08/2015 Qty: 1 Inhaler, Refills: 3    aspirin EC 81 MG tablet Take 1 tablet (81 mg total) by mouth daily. Qty: 30 tablet, Refills: 3    carvedilol (COREG) 12.5 MG tablet Take 1 tablet (12.5 mg total) by mouth 2 (two) times daily with a meal. Qty: 60  tablet, Refills: 3    lisinopril (PRINIVIL,ZESTRIL) 20 MG tablet Take 1 tablet (20 mg total) by mouth daily. Qty: 30 tablet, Refills: 3    potassium chloride SA (K-DUR,KLOR-CON) 20 MEQ tablet Take 1 tablet (20 mEq total) by mouth 2 (two) times daily. Qty: 90 tablet, Refills: 3    simvastatin (ZOCOR) 40 MG tablet Take 1 tablet (40 mg total) by mouth daily. Qty: 30 tablet, Refills: 3      CONTINUE these medications which have NOT CHANGED   Details  famotidine (PEPCID) 20 MG tablet Take 1 tablet (20 mg total) by mouth 2 (two) times daily as  needed for heartburn or indigestion. Qty: 60 tablet, Refills: 1      STOP taking these medications     esomeprazole (NEXIUM) 40 MG capsule      isosorbide-hydrALAZINE (BIDIL) 20-37.5 MG tablet      torsemide (DEMADEX) 20 MG tablet          DISCHARGE INSTRUCTIONS:    Follow with PMD in 1 months.  If you experience worsening of your admission symptoms, develop shortness of breath, life threatening emergency, suicidal or homicidal thoughts you must seek medical attention immediately by calling 911 or calling your MD immediately  if symptoms less severe.  You Must read complete instructions/literature along with all the possible adverse reactions/side effects for all the Medicines you take and that have been prescribed to you. Take any new Medicines after you have completely understood and accept all the possible adverse reactions/side effects.   Please note  You were cared for by a hospitalist during your hospital stay. If you have any questions about your discharge medications or the care you received while you were in the hospital after you are discharged, you can call the unit and asked to speak with the hospitalist on call if the hospitalist that took care of you is not available. Once you are discharged, your primary care physician will handle any further medical issues. Please note that NO REFILLS for any discharge medications will be authorized once you are discharged, as it is imperative that you return to your primary care physician (or establish a relationship with a primary care physician if you do not have one) for your aftercare needs so that they can reassess your need for medications and monitor your lab values.    Today   CHIEF COMPLAINT:   Chief Complaint  Patient presents with  . Shortness of Breath    HISTORY OF PRESENT ILLNESS:  Bradley Mckenzie  is a 50 y.o. male with a known history of Hypertensive heart disease, combined systolic and end-diastolic heart  failure, diabetes, or sleep apnea, morbid obesity, hyperlipidemia- was admitted for congestive heart failure in March 2017 and was given prescription for few days after discharge. He was also advised to have follow-up in cardiology clinic and stress test after discharge. He lost his job and so his insurance so he could not follow up with any clinics and he also did not fill up his prescriptions after he finished medication from discharge for last 2 months he is not taking any of the medications. He came to the hospital now as for last 2 weeks he has been having worsening swelling on his legs and he has to sleep in the recliner because of shortness of breath at night. Troponin is borderline elevated and some pulmonary edema on exam and his blood pressure is elevated so given his admission to hospitalist team.  VITAL SIGNS:  Blood pressure  132/66, pulse 81, temperature 98.1 F (36.7 C), temperature source Oral, resp. rate 20, height 5\' 9"  (1.753 m), weight 135.716 kg (299 lb 3.2 oz), SpO2 98 %.  I/O:   Intake/Output Summary (Last 24 hours) at 07/10/15 1229 Last data filed at 07/10/15 1019  Gross per 24 hour  Intake    480 ml  Output   1700 ml  Net  -1220 ml    PHYSICAL EXAMINATION:   GENERAL: 50 y.o.-year-old patient lying in the bed with no acute distress.  EYES: Pupils equal, round, reactive to light and accommodation. No scleral icterus. Extraocular muscles intact.  HEENT: Head atraumatic, normocephalic. Oropharynx and nasopharynx clear.  NECK: Supple, no jugular venous distention. No thyroid enlargement, no tenderness.  LUNGS: Normal breath sounds bilaterally, no wheezing, rales,rhonchi or crepitation. No use of accessory muscles of respiration.  CARDIOVASCULAR: S1, S2 normal. No murmurs, rubs, or gallops.  ABDOMEN: Soft, nontender, nondistended. Bowel sounds present. No organomegaly or mass.  EXTREMITIES: No pedal edema, cyanosis, or clubbing.  NEUROLOGIC: Cranial nerves II  through XII are intact. Muscle strength 5/5 in all extremities. Sensation intact. Gait not checked.  PSYCHIATRIC: The patient is alert and oriented x 3.  SKIN: No obvious rash, lesion, or ulcer.   DATA REVIEW:   CBC  Recent Labs Lab 07/09/15 0435  WBC 11.8*  HGB 11.7*  HCT 35.5*  PLT 214    Chemistries   Recent Labs Lab 07/09/15 0435  NA 140  K 3.9  CL 104  CO2 26  GLUCOSE 132*  BUN 20  CREATININE 1.34*  CALCIUM 9.1    Cardiac Enzymes  Recent Labs Lab 07/08/15 1653  TROPONINI 0.07*    Microbiology Results  No results found for this or any previous visit.  RADIOLOGY:  Dg Chest 2 View  07/09/2015  CLINICAL DATA:  Acute on chronic CHF EXAM: CHEST  2 VIEW COMPARISON:  Chest radiograph from one day prior. FINDINGS: Stable cardiomediastinal silhouette with mild cardiomegaly. No pneumothorax. No pleural effusion. No appreciable change in mild to moderate pulmonary edema. IMPRESSION: Stable mild-to-moderate congestive heart failure. Electronically Signed   By: Ilona Sorrel M.D.   On: 07/09/2015 10:56    EKG:   Orders placed or performed during the hospital encounter of 07/08/15  . ED EKG  . ED EKG      Management plans discussed with the patient, family and they are in agreement.  CODE STATUS:     Code Status Orders        Start     Ordered   07/08/15 1028  Full code   Continuous     07/08/15 1027    Code Status History    Date Active Date Inactive Code Status Order ID Comments User Context   03/27/2015  1:46 PM 03/31/2015  4:05 PM Full Code [id number]  Henreitta Leber, MD ED   08/07/2014  9:00 PM 08/08/2014  6:19 PM Full Code [id number]  Baxter Hire, MD Inpatient      TOTAL TIME TAKING CARE OF THIS PATIENT: 35 minutes.    Vaughan Basta M.D on 07/10/2015 at 12:29 PM  Between 7am to 6pm - Pager - [phone]  After 6pm go to [email or web address] - password [email or web address] [facility]  Office  [phone]  CC: Primary care  physician; Nat Christen, PA-C   Note: This dictation was prepared with Dragon dictation along with smaller phrase technology. Any transcriptional errors that result from this process are unintentional.

## 2015-07-10 NOTE — Care Management (Signed)
Patient does not qualify for home O2 after rechecking saturations on ambulation. There is no chairity program that will pay for a CPAP machine. [facility] not able to help with this situation. [facility] to [facility] where [facility]. They are also not able to assist patient in getting a new machine without insurance. TC to attending. He states patient has no used the CPAP in years and can follow up as an outpatient to get his current one fixed or a new one. Explained this to patient. Provided him with a medication management application and [facility]. Requested attending write prescription for is home meds to get filled at [facility] as well. He agreed.

## 2015-07-10 NOTE — Progress Notes (Signed)
Patient ambulated in hallway with nurse tech. On room air, O2 sat stayed 98% and above. No distress noted.

## 2015-07-10 NOTE — Care Management (Signed)
Requested new qualifying O2 sats from primary RN

## 2015-07-10 NOTE — Progress Notes (Signed)
Patient given discharge teaching and paperwork regarding medications, diet, follow-up appointments and activity. Patient understanding verbalized. No complaints at this time. IV and telemetry discontinued prior to leaving. Skin assessment as previously charted and vitals are stable; on room air. Patient being discharged to home. Caregiver/family present during discharge teaching. No further needs by Care Management. Prescriptions printed and given to patient.

## 2015-07-10 NOTE — Discharge Instructions (Signed)
Fluid restriction up to 1500 ml daily Low salt diet. Daily weigh your self, if > 2 Lb gain in a day or > 5 Lb in 1 week- take lasix 40 mg 2 times a day for 2 days- and still not able to get rid of extra weight- call your doctor or cardiologist.

## 2015-07-20 ENCOUNTER — Ambulatory Visit: Payer: Self-pay

## 2015-08-15 ENCOUNTER — Ambulatory Visit: Payer: Self-pay | Attending: Family | Admitting: Family

## 2015-08-15 ENCOUNTER — Encounter: Payer: Self-pay | Admitting: Family

## 2015-08-15 VITALS — BP 174/92 | HR 100 | Resp 18 | Ht 69.0 in | Wt 294.0 lb

## 2015-08-15 DIAGNOSIS — Z7982 Long term (current) use of aspirin: Secondary | ICD-10-CM | POA: Insufficient documentation

## 2015-08-15 DIAGNOSIS — Z9889 Other specified postprocedural states: Secondary | ICD-10-CM | POA: Insufficient documentation

## 2015-08-15 DIAGNOSIS — Z833 Family history of diabetes mellitus: Secondary | ICD-10-CM | POA: Insufficient documentation

## 2015-08-15 DIAGNOSIS — I5022 Chronic systolic (congestive) heart failure: Secondary | ICD-10-CM

## 2015-08-15 DIAGNOSIS — Z79899 Other long term (current) drug therapy: Secondary | ICD-10-CM | POA: Insufficient documentation

## 2015-08-15 DIAGNOSIS — E119 Type 2 diabetes mellitus without complications: Secondary | ICD-10-CM | POA: Insufficient documentation

## 2015-08-15 DIAGNOSIS — F329 Major depressive disorder, single episode, unspecified: Secondary | ICD-10-CM | POA: Insufficient documentation

## 2015-08-15 DIAGNOSIS — I429 Cardiomyopathy, unspecified: Secondary | ICD-10-CM | POA: Insufficient documentation

## 2015-08-15 DIAGNOSIS — Z809 Family history of malignant neoplasm, unspecified: Secondary | ICD-10-CM | POA: Insufficient documentation

## 2015-08-15 DIAGNOSIS — I5032 Chronic diastolic (congestive) heart failure: Secondary | ICD-10-CM | POA: Insufficient documentation

## 2015-08-15 DIAGNOSIS — Z7951 Long term (current) use of inhaled steroids: Secondary | ICD-10-CM | POA: Insufficient documentation

## 2015-08-15 DIAGNOSIS — Z8249 Family history of ischemic heart disease and other diseases of the circulatory system: Secondary | ICD-10-CM | POA: Insufficient documentation

## 2015-08-15 DIAGNOSIS — I110 Hypertensive heart disease with heart failure: Secondary | ICD-10-CM | POA: Insufficient documentation

## 2015-08-15 DIAGNOSIS — G4730 Sleep apnea, unspecified: Secondary | ICD-10-CM | POA: Insufficient documentation

## 2015-08-15 DIAGNOSIS — Z6841 Body Mass Index (BMI) 40.0 and over, adult: Secondary | ICD-10-CM | POA: Insufficient documentation

## 2015-08-15 DIAGNOSIS — G4733 Obstructive sleep apnea (adult) (pediatric): Secondary | ICD-10-CM

## 2015-08-15 DIAGNOSIS — I10 Essential (primary) hypertension: Secondary | ICD-10-CM

## 2015-08-15 DIAGNOSIS — E785 Hyperlipidemia, unspecified: Secondary | ICD-10-CM | POA: Insufficient documentation

## 2015-08-15 MED ORDER — FUROSEMIDE 40 MG PO TABS: 40.0000 mg | ORAL_TABLET | Freq: Two times a day (BID) | ORAL | 3 refills | Status: DC

## 2015-08-15 NOTE — Progress Notes (Signed)
Subjective:    Patient ID: Bradley Pitts., male    DOB: 1965/02/25, 50 y.o.   MRN: [id number]  Congestive Heart Failure  Presents for follow-up visit. Associated symptoms include edema, fatigue and shortness of breath. Pertinent negatives include no abdominal pain, chest pain or palpitations. The symptoms have been improving. Compliance with total regimen is 0-25%. Compliance problems include adherence to exercise.   Hypertension  This is a chronic problem. The current episode started more than 1 year ago. The problem has been waxing and waning since onset. Associated symptoms include peripheral edema and shortness of breath. Pertinent negatives include no chest pain, headaches, neck pain or palpitations. There are no associated agents to hypertension. Risk factors for coronary artery disease include diabetes mellitus, dyslipidemia, male gender, obesity, sedentary lifestyle and family history. Past treatments include ACE inhibitors, beta blockers, diuretics and lifestyle changes. The current treatment provides mild improvement. Compliance problems include exercise.  Hypertensive end-organ damage includes kidney disease and heart failure.   Past Medical History:  Diagnosis Date  . Cardiomyopathy ([place])    a. 01/2014 Echo: EF 25-30%;  b. ? ischemic vs non-ischemic.  He's never had an ischemic eval.  . Chronic combined systolic (congestive) and diastolic (congestive) heart failure ([place])    a. 01/2014 Echo: EF 25-30%. b. Echo 03/2015: Improved EF of 40-45%  . Depression   . Diabetes mellitus without complication ([place])    a. Not previously on outpt meds.  . Hyperlipidemia   . Hypertensive heart disease    a. Since his 9's.  . Morbid obesity ([place])   . Obstructive sleep apnea    a. Has not used CPAP since ~ 2012.    Past Surgical History:  Procedure Laterality Date  . HERNIA REPAIR  2004    Family History  Problem Relation Age of Onset  . Heart disease Mother     alive & well.  [name]  Heart attack Father 21    died @ 87 of cancer.  . Diabetes Father   . Cancer Father   . Cancer Sister     Social History  Substance Use Topics  . Smoking status: Former Research scientist (life sciences)  . Smokeless tobacco: Never Used     Comment: Quit in his 20's.  . Alcohol use 0.0 oz/week     Comment: occassional alcohol use at special events    No Known Allergies  Prior to Admission medications   Medication Sig Start Date End Date Taking? Authorizing Provider  albuterol (PROVENTIL HFA;VENTOLIN HFA) 108 (90 Base) MCG/ACT inhaler Inhale 2 puffs into the lungs every 6 (six) hours as needed for wheezing or shortness of breath. Reported on 07/08/2015 07/10/15  Yes Vaughan Basta, MD  aspirin EC 81 MG tablet Take 1 tablet (81 mg total) by mouth daily. 07/10/15  Yes Vaughan Basta, MD  carvedilol (COREG) 12.5 MG tablet Take 1 tablet (12.5 mg total) by mouth 2 (two) times daily with a meal. 07/10/15  Yes Vaughan Basta, MD  furosemide (LASIX) 40 MG tablet Take 1 tablet (40 mg total) by mouth 2 (two) times daily. 08/15/15  Yes Alisa Graff, FNP  hydrALAZINE (APRESOLINE) 25 MG tablet Take 1 tablet (25 mg total) by mouth 3 (three) times daily. 07/10/15  Yes Vaughan Basta, MD  lisinopril (PRINIVIL,ZESTRIL) 20 MG tablet Take 1 tablet (20 mg total) by mouth daily. 07/10/15  Yes Vaughan Basta, MD  potassium chloride SA (K-DUR,KLOR-CON) 20 MEQ tablet Take 1 tablet (20 mEq total) by mouth 2 (two) times  daily. 07/10/15  Yes Vaughan Basta, MD  simvastatin (ZOCOR) 40 MG tablet Take 1 tablet (40 mg total) by mouth daily. 07/10/15  Yes Vaughan Basta, MD      Review of Systems  Constitutional: Positive for fatigue. Negative for appetite change.  HENT: Negative for congestion, postnasal drip and sore throat.   Eyes: Negative.   Respiratory: Positive for cough and shortness of breath. Negative for chest tightness and wheezing.   Cardiovascular: Positive for leg swelling. Negative  for chest pain and palpitations.  Gastrointestinal: Positive for abdominal distention. Negative for abdominal pain.  Endocrine: Negative.   Genitourinary: Negative.   Musculoskeletal: Negative for back pain and neck pain.  Skin: Positive for rash (bilateral lower legs).  Allergic/Immunologic: Negative.   Neurological: Negative for dizziness, light-headedness and headaches.  Hematological: Negative for adenopathy. Does not bruise/bleed easily.  Psychiatric/Behavioral: Positive for sleep disturbance (sleeping on 5 pillows). Negative for dysphoric mood. The patient is not nervous/anxious.        Objective:   Physical Exam  Constitutional: He is oriented to person, place, and time. He appears well-developed and well-nourished.  HENT:  Head: Normocephalic and atraumatic.  Eyes: Conjunctivae are normal. Pupils are equal, round, and reactive to light.  Neck: Normal range of motion. Neck supple.  Cardiovascular: Regular rhythm.  Tachycardia present.   Pulmonary/Chest: Effort normal. He has no wheezes. He has no rales.  Abdominal: Soft. He exhibits distension. There is no tenderness.  Musculoskeletal: He exhibits edema (2+ pitting edema in bilateral lower legs). He exhibits no tenderness.  Neurological: He is alert and oriented to person, place, and time.  Skin: Skin is warm and dry. Lesion noted.  Lesions noted on bilateral lower legs   Psychiatric: He has a normal mood and affect. His behavior is normal. Thought content normal.  Nursing note and vitals reviewed.  BP (!) 174/92   Pulse 100   Resp 18   Ht 5\' 9"  (1.753 m)   Wt 294 lb (133.4 kg)   SpO2 97%   BMI 43.42 kg/m         Assessment & Plan:  1: Chronic heart failure with reduced ejection fraction- Patient presents with fatigue and shortness of breath with minimal exertion (Class III). Has not been seen here in the last 7 months. He does feel like his symptoms are improving since he's been back on his medications. He  continues to weigh himself daily and says that his weight has been stable. By our scale, he's lost 7 pounds since last here on 01/30/15. Reminded to call for an overnight weight gain of >2 pounds or a weekly weight gain of >5 pounds. He is not adding any salt to his food and is trying to eat low sodium foods. Is not going out to eat much due to finances. He hasn't been taking his furosemide consistently because he's trying to make it last longer. Due to edema in his legs and abdomen, am increasing it to twice daily. He is already taking potassium twice daily. Patient commits to taking the furosemide twice daily. [facility] Pharm[facility] went in and reviewed medications with the patient. 2: HTN- Blood pressure elevated but patient says that he just took his medications prior to coming to the office. Increasing furosemide per above will hopefully help lower his blood pressure. 3: Obstructive sleep apnea- Patient says that he's not sleeping well but is unable to afford to get his CPAP equipment because he's currently unemployed. Does not plan on filing for unemployment because  he doesn't want to give out his financial information. 4: Diabetes- He is currently not taking any medication for this. Isn't following with a PCP, again, due to finances. Discussed going to [facility] and stressed the importance of calling them to get the paperwork needed to get an appointment. Advised him that he will need to supply financial information to them but it was important to keep a close eye on his glucose levels. Phone number to [facility] provided to patient.   Patient currently going to medication management clinic but he says that he wants to try to get his medications at [facility] so that he doesn't have to supply financial information.   Reviewed medications with the patient.  Return here in 3 weeks or sooner for any questions/problems before then.

## 2015-08-15 NOTE — Patient Instructions (Addendum)
Continue weighing daily and call for an overnight weight gain of > 2 pounds or a weekly weight gain of >5 pounds.  Increase furosemide (Lasix) to twice daily.   **Call [facility] at [phone] for information about scheduling an appointment**

## 2015-08-29 ENCOUNTER — Telehealth: Payer: Self-pay | Admitting: Family

## 2015-08-29 NOTE — Telephone Encounter (Signed)
Patient's fiance', Thelma, called to say that patient has been having some palpitations and shortness of breath with exertion. No real change in his weight per her report and no worsening of edema. Denies being out of his medications. Says that his blood pressure is also running high 190's/100's. Advised Thelma to increase his diuretic to 80mg  twice daily along with potassium 8meq twice daily. Will see patient on 09/01/15. Did advise Phineas Semen that if his blood pressure remained elevated, that he should present to the ER for evaluation. Thelma verbalizes understanding of medication changes and follow-up appointment.

## 2015-09-01 ENCOUNTER — Other Ambulatory Visit: Payer: Self-pay

## 2015-09-01 ENCOUNTER — Inpatient Hospital Stay
Admission: EM | Admit: 2015-09-01 | Discharge: 2015-09-05 | DRG: 291 | Disposition: A | Discharge status: Home / Self Care | Payer: Self-pay | Attending: Internal Medicine | Admitting: Internal Medicine

## 2015-09-01 ENCOUNTER — Encounter: Payer: Self-pay | Admitting: Family

## 2015-09-01 ENCOUNTER — Encounter: Payer: Self-pay | Admitting: Emergency Medicine

## 2015-09-01 ENCOUNTER — Emergency Department: Payer: Self-pay

## 2015-09-01 ENCOUNTER — Ambulatory Visit: Payer: Self-pay | Attending: Family | Admitting: Family

## 2015-09-01 VITALS — BP 209/121 | HR 96 | Resp 24 | Ht 69.0 in | Wt 307.0 lb

## 2015-09-01 DIAGNOSIS — Z809 Family history of malignant neoplasm, unspecified: Secondary | ICD-10-CM

## 2015-09-01 DIAGNOSIS — E876 Hypokalemia: Secondary | ICD-10-CM | POA: Diagnosis present

## 2015-09-01 DIAGNOSIS — Z7982 Long term (current) use of aspirin: Secondary | ICD-10-CM | POA: Insufficient documentation

## 2015-09-01 DIAGNOSIS — G4733 Obstructive sleep apnea (adult) (pediatric): Secondary | ICD-10-CM | POA: Diagnosis present

## 2015-09-01 DIAGNOSIS — N1831 Chronic kidney disease, stage 3a: Secondary | ICD-10-CM

## 2015-09-01 DIAGNOSIS — J9600 Acute respiratory failure, unspecified whether with hypoxia or hypercapnia: Secondary | ICD-10-CM | POA: Diagnosis present

## 2015-09-01 DIAGNOSIS — Z9119 Patient's noncompliance with other medical treatment and regimen: Secondary | ICD-10-CM

## 2015-09-01 DIAGNOSIS — E785 Hyperlipidemia, unspecified: Secondary | ICD-10-CM | POA: Insufficient documentation

## 2015-09-01 DIAGNOSIS — Z87891 Personal history of nicotine dependence: Secondary | ICD-10-CM

## 2015-09-01 DIAGNOSIS — Z9989 Dependence on other enabling machines and devices: Secondary | ICD-10-CM

## 2015-09-01 DIAGNOSIS — Z833 Family history of diabetes mellitus: Secondary | ICD-10-CM | POA: Insufficient documentation

## 2015-09-01 DIAGNOSIS — Z6841 Body Mass Index (BMI) 40.0 and over, adult: Secondary | ICD-10-CM | POA: Insufficient documentation

## 2015-09-01 DIAGNOSIS — Z9889 Other specified postprocedural states: Secondary | ICD-10-CM | POA: Insufficient documentation

## 2015-09-01 DIAGNOSIS — I5023 Acute on chronic systolic (congestive) heart failure: Secondary | ICD-10-CM

## 2015-09-01 DIAGNOSIS — I509 Heart failure, unspecified: Secondary | ICD-10-CM

## 2015-09-01 DIAGNOSIS — F329 Major depressive disorder, single episode, unspecified: Secondary | ICD-10-CM | POA: Insufficient documentation

## 2015-09-01 DIAGNOSIS — Z599 Problem related to housing and economic circumstances, unspecified: Secondary | ICD-10-CM

## 2015-09-01 DIAGNOSIS — I10 Essential (primary) hypertension: Secondary | ICD-10-CM

## 2015-09-01 DIAGNOSIS — Z8249 Family history of ischemic heart disease and other diseases of the circulatory system: Secondary | ICD-10-CM

## 2015-09-01 DIAGNOSIS — Z79899 Other long term (current) drug therapy: Secondary | ICD-10-CM

## 2015-09-01 DIAGNOSIS — E119 Type 2 diabetes mellitus without complications: Secondary | ICD-10-CM | POA: Insufficient documentation

## 2015-09-01 DIAGNOSIS — Z9111 Patient's noncompliance with dietary regimen: Secondary | ICD-10-CM

## 2015-09-01 DIAGNOSIS — E875 Hyperkalemia: Secondary | ICD-10-CM | POA: Diagnosis present

## 2015-09-01 DIAGNOSIS — N182 Chronic kidney disease, stage 2 (mild): Secondary | ICD-10-CM | POA: Diagnosis present

## 2015-09-01 DIAGNOSIS — J441 Chronic obstructive pulmonary disease with (acute) exacerbation: Secondary | ICD-10-CM | POA: Diagnosis present

## 2015-09-01 DIAGNOSIS — I429 Cardiomyopathy, unspecified: Secondary | ICD-10-CM | POA: Insufficient documentation

## 2015-09-01 DIAGNOSIS — I110 Hypertensive heart disease with heart failure: Secondary | ICD-10-CM | POA: Insufficient documentation

## 2015-09-01 DIAGNOSIS — E782 Mixed hyperlipidemia: Secondary | ICD-10-CM | POA: Diagnosis present

## 2015-09-01 DIAGNOSIS — I130 Hypertensive heart and chronic kidney disease with heart failure and stage 1 through stage 4 chronic kidney disease, or unspecified chronic kidney disease: Principal | ICD-10-CM | POA: Diagnosis present

## 2015-09-01 DIAGNOSIS — E1122 Type 2 diabetes mellitus with diabetic chronic kidney disease: Secondary | ICD-10-CM | POA: Diagnosis present

## 2015-09-01 DIAGNOSIS — N179 Acute kidney failure, unspecified: Secondary | ICD-10-CM | POA: Diagnosis present

## 2015-09-01 DIAGNOSIS — E669 Obesity, unspecified: Secondary | ICD-10-CM

## 2015-09-01 DIAGNOSIS — J9601 Acute respiratory failure with hypoxia: Secondary | ICD-10-CM | POA: Diagnosis present

## 2015-09-01 DIAGNOSIS — E0822 Diabetes mellitus due to underlying condition with diabetic chronic kidney disease: Secondary | ICD-10-CM

## 2015-09-01 DIAGNOSIS — I119 Hypertensive heart disease without heart failure: Secondary | ICD-10-CM

## 2015-09-01 DIAGNOSIS — Z5320 Procedure and treatment not carried out because of patient's decision for unspecified reasons: Secondary | ICD-10-CM | POA: Diagnosis present

## 2015-09-01 DIAGNOSIS — Z9114 Patient's other noncompliance with medication regimen: Secondary | ICD-10-CM

## 2015-09-01 LAB — URINALYSIS COMPLETE WITH MICROSCOPIC (ARMC ONLY)
BACTERIA UA: NONE SEEN
BILIRUBIN URINE: NEGATIVE
GLUCOSE, UA: NEGATIVE mg/dL
HGB URINE DIPSTICK: NEGATIVE
Ketones, ur: NEGATIVE mg/dL
Leukocytes, UA: NEGATIVE
NITRITE: NEGATIVE
Protein, ur: 100 mg/dL — AB
Specific Gravity, Urine: 1.014 (ref 1.005–1.030)
pH: 6 (ref 5.0–8.0)

## 2015-09-01 LAB — CBC WITH DIFFERENTIAL/PLATELET
BASOS ABS: 0 10*3/uL (ref 0–0.1)
BASOS PCT: 0 %
EOS ABS: 0.1 10*3/uL (ref 0–0.7)
EOS PCT: 1 %
HCT: 36.9 % — ABNORMAL LOW (ref 40.0–52.0)
Hemoglobin: 12.2 g/dL — ABNORMAL LOW (ref 13.0–18.0)
LYMPHS ABS: 1.3 10*3/uL (ref 1.0–3.6)
Lymphocytes Relative: 12 %
MCH: 27.6 pg (ref 26.0–34.0)
MCHC: 33.1 g/dL (ref 32.0–36.0)
MCV: 83.4 fL (ref 80.0–100.0)
MONOS PCT: 7 %
Monocytes Absolute: 0.7 10*3/uL (ref 0.2–1.0)
NEUTROS ABS: 9 10*3/uL — AB (ref 1.4–6.5)
NEUTROS PCT: 80 %
PLATELETS: 235 10*3/uL (ref 150–440)
RBC: 4.43 MIL/uL (ref 4.40–5.90)
RDW: 16.6 % — ABNORMAL HIGH (ref 11.5–14.5)
WBC: 11.2 10*3/uL — ABNORMAL HIGH (ref 3.8–10.6)

## 2015-09-01 LAB — MRSA PCR SCREENING: MRSA by PCR: NEGATIVE

## 2015-09-01 LAB — BASIC METABOLIC PANEL
Anion gap: 9 (ref 5–15)
BUN: 24 mg/dL — AB (ref 6–20)
CO2: 24 mmol/L (ref 22–32)
Calcium: 9.2 mg/dL (ref 8.9–10.3)
Chloride: 108 mmol/L (ref 101–111)
Creatinine, Ser: 1.32 mg/dL — ABNORMAL HIGH (ref 0.61–1.24)
GFR calc Af Amer: >60 mL/min (ref >60)
Glucose, Bld: 140 mg/dL — ABNORMAL HIGH (ref 65–99)
POTASSIUM: 3.7 mmol/L (ref 3.5–5.1)
SODIUM: 141 mmol/L (ref 135–145)

## 2015-09-01 LAB — BRAIN NATRIURETIC PEPTIDE: B Natriuretic Peptide: 1546 pg/mL — ABNORMAL HIGH (ref 0.0–100.0)

## 2015-09-01 LAB — TROPONIN I: TROPONIN I: 0.04 ng/mL — AB (ref <0.03)

## 2015-09-01 MED ORDER — IPRATROPIUM-ALBUTEROL 0.5-2.5 (3) MG/3ML IN SOLN
3.0000 mL | Freq: Four times a day (QID) | RESPIRATORY_TRACT | Status: DC
  Administered 2015-09-01 – 2015-09-04 (×13): 3 mL via RESPIRATORY_TRACT
  Filled 2015-09-01 (×14): qty 3

## 2015-09-01 MED ORDER — ALBUTEROL SULFATE (2.5 MG/3ML) 0.083% IN NEBU: 2.5000 mg | INHALATION_SOLUTION | RESPIRATORY_TRACT | Status: DC | PRN

## 2015-09-01 MED ORDER — METHYLPREDNISOLONE SODIUM SUCC 125 MG IJ SOLR
125.0000 mg | Freq: Once | INTRAMUSCULAR | Status: AC
  Administered 2015-09-01: 125 mg via INTRAVENOUS
  Filled 2015-09-01: qty 2

## 2015-09-01 MED ORDER — FUROSEMIDE 10 MG/ML IJ SOLN
80.0000 mg | Freq: Once | INTRAMUSCULAR | Status: AC
  Administered 2015-09-01: 80 mg via INTRAVENOUS
  Filled 2015-09-01: qty 8

## 2015-09-01 MED ORDER — HYDRALAZINE HCL 20 MG/ML IJ SOLN
20.0000 mg | INTRAMUSCULAR | Status: DC | PRN
  Administered 2015-09-01 – 2015-09-04 (×3): 20 mg via INTRAVENOUS
  Filled 2015-09-01 (×4): qty 1

## 2015-09-01 MED ORDER — ENOXAPARIN SODIUM 40 MG/0.4ML ~~LOC~~ SOLN
40.0000 mg | Freq: Two times a day (BID) | SUBCUTANEOUS | Status: DC
  Filled 2015-09-01 (×2): qty 0.4

## 2015-09-01 MED ORDER — SODIUM CHLORIDE 0.9% FLUSH: 3.0000 mL | INTRAVENOUS | Status: DC | PRN

## 2015-09-01 MED ORDER — ACETAMINOPHEN 650 MG RE SUPP: 650.0000 mg | Freq: Four times a day (QID) | RECTAL | Status: DC | PRN

## 2015-09-01 MED ORDER — CETYLPYRIDINIUM CHLORIDE 0.05 % MT LIQD
7.0000 mL | Freq: Two times a day (BID) | OROMUCOSAL | Status: DC
  Administered 2015-09-01: 7 mL via OROMUCOSAL

## 2015-09-01 MED ORDER — FUROSEMIDE 10 MG/ML IJ SOLN
40.0000 mg | Freq: Two times a day (BID) | INTRAMUSCULAR | Status: DC
  Administered 2015-09-01 – 2015-09-02 (×2): 40 mg via INTRAVENOUS
  Filled 2015-09-01 (×2): qty 4

## 2015-09-01 MED ORDER — ASPIRIN EC 81 MG PO TBEC
81.0000 mg | DELAYED_RELEASE_TABLET | Freq: Every day | ORAL | Status: DC
  Administered 2015-09-02 – 2015-09-05 (×4): 81 mg via ORAL
  Filled 2015-09-01 (×4): qty 1

## 2015-09-01 MED ORDER — NITROGLYCERIN 0.4 MG SL SUBL
0.4000 mg | SUBLINGUAL_TABLET | SUBLINGUAL | Status: DC | PRN
  Filled 2015-09-01: qty 1

## 2015-09-01 MED ORDER — SODIUM CHLORIDE 0.9% FLUSH
3.0000 mL | Freq: Two times a day (BID) | INTRAVENOUS | Status: DC
  Administered 2015-09-01 – 2015-09-03 (×3): 3 mL via INTRAVENOUS

## 2015-09-01 MED ORDER — CHLORHEXIDINE GLUCONATE 0.12 % MT SOLN
15.0000 mL | Freq: Two times a day (BID) | OROMUCOSAL | Status: DC
  Administered 2015-09-01: 15 mL via OROMUCOSAL
  Filled 2015-09-01: qty 15

## 2015-09-01 MED ORDER — ACETAMINOPHEN 325 MG PO TABS: 650.0000 mg | ORAL_TABLET | Freq: Four times a day (QID) | ORAL | Status: DC | PRN

## 2015-09-01 MED ORDER — CARVEDILOL 12.5 MG PO TABS
12.5000 mg | ORAL_TABLET | Freq: Two times a day (BID) | ORAL | Status: DC
  Administered 2015-09-01 – 2015-09-05 (×8): 12.5 mg via ORAL
  Filled 2015-09-01 (×2): qty 1
  Filled 2015-09-01 (×2): qty 2
  Filled 2015-09-01 (×4): qty 1
  Filled 2015-09-01: qty 2

## 2015-09-01 MED ORDER — SODIUM CHLORIDE 0.9 % IV SOLN: 250.0000 mL | INTRAVENOUS | Status: DC | PRN

## 2015-09-01 MED ORDER — ONDANSETRON HCL 4 MG/2ML IJ SOLN: 4.0000 mg | Freq: Four times a day (QID) | INTRAMUSCULAR | Status: DC | PRN

## 2015-09-01 MED ORDER — METHYLPREDNISOLONE SODIUM SUCC 125 MG IJ SOLR: 60.0000 mg | Freq: Three times a day (TID) | INTRAMUSCULAR | Status: DC

## 2015-09-01 MED ORDER — LISINOPRIL 20 MG PO TABS
20.0000 mg | ORAL_TABLET | Freq: Every day | ORAL | Status: DC
  Administered 2015-09-02 – 2015-09-04 (×3): 20 mg via ORAL
  Filled 2015-09-01 (×3): qty 1

## 2015-09-01 MED ORDER — ALBUTEROL SULFATE HFA 108 (90 BASE) MCG/ACT IN AERS: 2.0000 | INHALATION_SPRAY | Freq: Four times a day (QID) | RESPIRATORY_TRACT | Status: DC | PRN

## 2015-09-01 MED ORDER — DOCUSATE SODIUM 100 MG PO CAPS
100.0000 mg | ORAL_CAPSULE | Freq: Two times a day (BID) | ORAL | Status: DC
  Administered 2015-09-01 – 2015-09-05 (×7): 100 mg via ORAL
  Filled 2015-09-01 (×7): qty 1

## 2015-09-01 MED ORDER — POTASSIUM CHLORIDE CRYS ER 20 MEQ PO TBCR
20.0000 meq | EXTENDED_RELEASE_TABLET | Freq: Two times a day (BID) | ORAL | Status: DC
  Administered 2015-09-01 – 2015-09-02 (×3): 20 meq via ORAL
  Filled 2015-09-01 (×4): qty 1

## 2015-09-01 MED ORDER — SODIUM CHLORIDE 0.9% FLUSH
3.0000 mL | Freq: Two times a day (BID) | INTRAVENOUS | Status: DC
  Administered 2015-09-01 – 2015-09-05 (×3): 3 mL via INTRAVENOUS

## 2015-09-01 MED ORDER — SIMVASTATIN 40 MG PO TABS
40.0000 mg | ORAL_TABLET | Freq: Every day | ORAL | Status: DC
  Administered 2015-09-02 – 2015-09-05 (×4): 40 mg via ORAL
  Filled 2015-09-01 (×4): qty 1

## 2015-09-01 MED ORDER — ONDANSETRON HCL 4 MG PO TABS: 4.0000 mg | ORAL_TABLET | Freq: Four times a day (QID) | ORAL | Status: DC | PRN

## 2015-09-01 NOTE — ED Notes (Signed)
ICU informed pt was on the way upstairs and second IV has been placed.

## 2015-09-01 NOTE — ED Triage Notes (Signed)
C/o shortness of breath for a few days. Feels like when he gets fluid on lungs r/t chf. Abdomen is tight and swollen. Pt reports swelling in legs. Hypoxic from cardiac clinic and in ED. sats upper 70s-80s on room air. Placed on oxygen

## 2015-09-01 NOTE — Consult Note (Signed)
PULMONARY / CRITICAL CARE MEDICINE   Name: Bradley Mckenzie. MRN: [id number] DOB: 1965-09-03    ADMISSION DATE:  09/01/2015 Referring MD - Dr. Margaretmary Eddy  CC:"SOB"  HISTORY: Male past medical history of systolic CHF, ejection fraction 40-45%, asthma, diabetes, hyperlipidemia, hypertension, obesity, OSA noncompliant with CPAP, sent to the ER from his heart failure clinic due to worsening shortness of breath over the past 2 weeks. He states that he saw heart failure clinic nurse today, his gained 12 pounds in the past 1 week, was recommended to take 80 mg of Lasix daily by heart failure clinic, however still with weight gain and shortness of breath. He denies any significant changes in his diet any fever, any nausea or any vomiting. Admits to chest tightness, and shortness of breath. Upon arrival to the ED he was noted to be hypoxic with sats in the upper 70s, he was placed on a nonrebreather/Ventimask at times, with saturations greater than 90%. He is transferred to the ICU for further monitoring and initiation of BiPAP. He states that he's had a history of hypertension for a number of years, he is a never smoker, he is sure why he is being labeled as a COPD patient. He states he was diagnosed with asthma about 3 years ago by a primary care physician who is mainly cannot recall.  STUDIES:  03/2015 ECHO> mild LV dilation, 40-45 percent EF,  SIGNIFICANT EVENTS: 8/18> shortness of breath worsening over the past 1 week, saw heart failure clinic, sent to ER due to 12 pound weight gain and hypoxia with sats in the 70s, placed on NRB and intermittent Ventimask.  VITAL SIGNS: Temp:  [97.4 F (36.3 C)-97.7 F (36.5 C)] 97.4 F (36.3 C) (08/18 1630) Pulse Rate:  [72-96] 79 (08/18 1630) Resp:  [18-35] 27 (08/18 1630) BP: (148-209)/(81-122) 166/106 (08/18 1654) SpO2:  [78 %-100 %] 100 % (08/18 1630) FiO2 (%):  [35 %] 35 % (08/18 1630) Weight:  [304 lb 3.8 oz (138 kg)-307 lb (139.3 kg)] 304 lb 3.8 oz  (138 kg) (08/18 1630) HEMODYNAMICS:   VENTILATOR SETTINGS: FiO2 (%):  [35 %] 35 % INTAKE / OUTPUT:  Intake/Output Summary (Last 24 hours) at 09/01/15 1749 Last data filed at 09/01/15 1532  Gross per 24 hour  Intake                0 ml  Output             1400 ml  Net            -1400 ml    Review of Systems  Constitutional: Negative for chills and fever.  HENT: Negative for hearing loss.   Eyes: Negative for blurred vision and double vision.  Respiratory: Positive for shortness of breath. Negative for cough, hemoptysis, sputum production and wheezing.   Cardiovascular: Positive for chest pain and leg swelling. Negative for palpitations, orthopnea and claudication.  Gastrointestinal: Negative for abdominal pain, constipation, diarrhea, heartburn, nausea and vomiting.  Genitourinary: Positive for frequency.  Musculoskeletal: Negative for myalgias.  Skin: Negative for rash.  Neurological: Negative for dizziness and headaches.  Endo/Heme/Allergies: Does not bruise/bleed easily.  Psychiatric/Behavioral: Negative for depression.    Physical Exam  Constitutional: He is oriented to person, place, and time and well-developed, well-nourished, and in no distress.  HENT:  Head: Normocephalic and atraumatic.  Eyes: Conjunctivae and EOM are normal. Pupils are equal, round, and reactive to light.  Neck: Normal range of motion.  Cardiovascular: Normal rate and normal heart  sounds.   Pulmonary/Chest: He is in respiratory distress. He has no wheezes. He has rales. He exhibits no tenderness.  Mild resp distress with tachypnea, no use of accessory muscles.  Mild/fine crackles at the bases.   Abdominal: Soft. Bowel sounds are normal.  Musculoskeletal: Normal range of motion. He exhibits edema.  Neurological: He is alert and oriented to person, place, and time. Gait normal.  Skin: Skin is warm and dry.  Psychiatric: Affect normal.  Nursing note and vitals  reviewed.    LABS:  CBC  Recent Labs Lab 09/01/15 1122  WBC 11.2*  HGB 12.2*  HCT 36.9*  PLT 235   Coag's No results for input(s): APTT, INR in the last 168 hours. BMET  Recent Labs Lab 09/01/15 1122  NA 141  K 3.7  CL 108  CO2 24  BUN 24*  CREATININE 1.32*  GLUCOSE 140*   Electrolytes  Recent Labs Lab 09/01/15 1122  CALCIUM 9.2   Sepsis Markers No results for input(s): LATICACIDVEN, PROCALCITON, O2SATVEN in the last 168 hours. ABG No results for input(s): PHART, PCO2ART, PO2ART in the last 168 hours. Liver Enzymes No results for input(s): AST, ALT, ALKPHOS, BILITOT, ALBUMIN in the last 168 hours. Cardiac Enzymes  Recent Labs Lab 09/01/15 1122  TROPONINI 0.04*   Glucose No results for input(s): GLUCAP in the last 168 hours.  Imaging Dg Chest Portable 1 View  Result Date: 09/01/2015 CLINICAL DATA:  Shortness of breath for several days EXAM: PORTABLE CHEST 1 VIEW COMPARISON:  July 09, 2015 FINDINGS: There is no edema or consolidation. There is generalized cardiomegaly. Pulmonary vascularity is normal. No adenopathy. No bone lesions. IMPRESSION: Cardiomegaly.  No edema or consolidation. Electronically Signed   By: Lowella Grip III M.D.   On: 09/01/2015 11:18    LINES:   CULTURES:   ANTIBIOTICS  ASSESSMENT / PLAN: 50 year old male past medical history of systolic heart failure, class III, asthma, OSA noncompliant on CPAP, hypertension, obesity, admitted for acute on chronic heart failure with acute respiratory failure/hypoxia  Acute on chronic heart failure-systolic Acute hypoxic respiratory failure OSA on CPAP-noncompliant Hypertension-uncontrolled Obesity Diabetes Mild renal insufficiency   Plan: -Patient with known history of congestive heart failure, has had financial issues and at times able to afford meds. He has been compliant with his meds over the past 2 weeks since he is follow-up with heart clinic, however over the last week  or so he's gained about 13.8 pounds. BNP is elevated at 1500, suspect uncontrolled hypertension combined with untreated OSA is exacerbating his heart failure. -Continue with diuresis, noninvasive positive pressure ventilation as tolerated (BiPAP, target tidal volumes~500cc) -Cardiac diet -Fluid restriction, trend troponins -Patient with long-standing heart failure along with hypertension, may need further ischemic cardiomegaly workup by cardiology -Mild renal insufficiency, in the setting of acute on chronic CHF exacerbation and uncontrolled hypertension. Given long-standing hypertension, and mild renal insufficiency, may consider a nephrology consult -Diabetes-continue with SSI -Advised a prolonged discussion with the patient about OSA and CPAP compliance, states that he's had financial issues along with insurance issues limiting his ability to afford CPAP supplies, he has a machine at home, but has not used it since about 2012. Bipap QHS  -Patient states he is a never smoker, was diagnosed with asthma about 3 years ago. Would not recommend schedule steroids in the setting of acute congestive heart failure. Continue with when necessary broncho-dilators. He may have wheezing from interstitial edema and asthma exacerbation and when his CHF acts up, this is  not COPD. -Maintain blood pressure of systolic less than [id number], when necessary hydralazine as needed for blood pressure greater than 140   Thank you for consulting [facility], Please feel free to contacts [place] with any questions at [phone] (please enter 7-digits).  I have personally obtained a history, examined the patient, evaluated laboratory and imaging results, formulated the assessment and plan and placed orders.   Pulmonary Care Time devoted to patient care services described in this note is 45 minutes.    Vilinda Boehringer, MD [facility] Pager [phone] (please enter 7-digits) On Call  Pager [phone] (please enter 7-digits)  Note: This note was prepared with Dragon dictation along with smaller phrase technology. Any transcriptional errors that result from this process are unintentional.

## 2015-09-01 NOTE — Progress Notes (Signed)
Order for enoxaparin changed to 40 mg BID based on BMI > 40  And CrCl > 30 mL/min per anticoagulation protocol.  Darylene Price Shamiya Demeritt 4:45 PM

## 2015-09-01 NOTE — ED Notes (Signed)
Admitting MD at bedside requesting bipap to be started again. Resp. Called and made aware.

## 2015-09-01 NOTE — Progress Notes (Signed)
Subjective:    Patient ID: Bradley Pitts., male    DOB: [birth year], 50 y.o.   MRN: [id number]  Congestive Heart Failure  Presents for follow-up visit. Associated symptoms include abdominal pain, chest pressure, edema, fatigue, orthopnea, palpitations, paroxysmal nocturnal dyspnea, shortness of breath and unexpected weight change. Pertinent negatives include no chest pain. The symptoms have been worsening.  Hypertension  This is a chronic problem. The current episode started more than 1 year ago. The problem has been gradually worsening since onset. The problem is uncontrolled. Associated symptoms include palpitations, peripheral edema and shortness of breath. Pertinent negatives include no chest pain or neck pain. There are no associated agents to hypertension. Risk factors for coronary artery disease include obesity, male gender, family history, diabetes mellitus and dyslipidemia. Past treatments include beta blockers, diuretics and lifestyle changes. The current treatment provides mild improvement. Compliance problems include exercise and medication cost.  Hypertensive end-organ damage includes heart failure.   Past Medical History:  Diagnosis Date  . Cardiomyopathy ([place])    a. 01/2014 Echo: EF 25-30%;  b. ? ischemic vs non-ischemic.  He's never had an ischemic eval.  . Chronic combined systolic (congestive) and diastolic (congestive) heart failure ([place])    a. 01/2014 Echo: EF 25-30%. b. Echo 03/2015: Improved EF of 40-45%  . Depression   . Diabetes mellitus without complication ([place])    a. Not previously on outpt meds.  . Hyperlipidemia   . Hypertensive heart disease    a. Since his 67's.  . Morbid obesity ([place])   . Obstructive sleep apnea    a. Has not used CPAP since ~ 2012.    Past Surgical History:  Procedure Laterality Date  . HERNIA REPAIR  2004    Family History  Problem Relation Age of Onset  . Heart disease Mother     alive & well.  [name] Heart attack Father 75    died  @ 58 of cancer.  . Diabetes Father   . Cancer Father   . Cancer Sister     Social History  Substance Use Topics  . Smoking status: Former Research scientist (life sciences)  . Smokeless tobacco: Never Used     Comment: Quit in his 20's.  . Alcohol use 0.0 oz/week     Comment: occassional alcohol use at special events    No Known Allergies  Prior to Admission medications   Medication Sig Start Date End Date Taking? Authorizing Provider  albuterol (PROVENTIL HFA;VENTOLIN HFA) 108 (90 Base) MCG/ACT inhaler Inhale 2 puffs into the lungs every 6 (six) hours as needed for wheezing or shortness of breath. Reported on 07/08/2015 07/10/15   Vaughan Basta, MD  aspirin EC 81 MG tablet Take 1 tablet (81 mg total) by mouth daily. 07/10/15   Vaughan Basta, MD  carvedilol (COREG) 12.5 MG tablet Take 1 tablet (12.5 mg total) by mouth 2 (two) times daily with a meal. 07/10/15   Vaughan Basta, MD  lisinopril (PRINIVIL,ZESTRIL) 20 MG tablet Take 1 tablet (20 mg total) by mouth daily. 07/10/15   Vaughan Basta, MD  potassium chloride SA (K-DUR,KLOR-CON) 20 MEQ tablet Take 1 tablet (20 mEq total) by mouth 2 (two) times daily. 07/10/15   Vaughan Basta, MD  simvastatin (ZOCOR) 40 MG tablet Take 1 tablet (40 mg total) by mouth daily. 07/10/15   Vaughan Basta, MD   Furosemide 80mg  twice daily in addition to above   Review of Systems  Constitutional: Positive for fatigue and unexpected weight change. Negative for appetite change.  HENT: Negative for congestion, postnasal drip and sore throat.   Eyes: Negative.   Respiratory: Positive for cough, chest tightness, shortness of breath and wheezing.   Cardiovascular: Positive for palpitations and leg swelling. Negative for chest pain.  Gastrointestinal: Positive for abdominal distention and abdominal pain.  Endocrine: Negative.   Genitourinary: Negative.   Musculoskeletal: Negative for back pain and neck pain.  Skin: Negative.    Allergic/Immunologic: Negative.   Neurological: Negative for dizziness and light-headedness.  Hematological: Negative for adenopathy. Does not bruise/bleed easily.  Psychiatric/Behavioral: Positive for sleep disturbance (sleeping on 5 pillows). Negative for decreased concentration. The patient is not nervous/anxious.        Objective:   Physical Exam  Constitutional: He is oriented to person, place, and time. He appears well-developed and well-nourished.  HENT:  Head: Normocephalic and atraumatic.  Eyes: Conjunctivae are normal. Pupils are equal, round, and reactive to light.  Neck: Normal range of motion. Neck supple.  Cardiovascular: Regular rhythm.  Tachycardia present.   Pulmonary/Chest: Effort normal. He has no wheezes. He has no rales.  Diminished throughout all lung fields  Abdominal: He exhibits distension. There is tenderness.  Musculoskeletal: He exhibits edema (3+ pitting edema in bilateral lower legs to bilateral knees). He exhibits no tenderness.  Neurological: He is alert and oriented to person, place, and time.  Skin: Skin is warm and dry.  Psychiatric: He has a normal mood and affect. His behavior is normal. Thought content normal.  Nursing note and vitals reviewed.   BP (!) 209/121   Pulse 96   Resp (!) 24   Ht 5\' 9"  (1.753 m)   Wt (!) 307 lb (139.3 kg)   SpO2 (!) 89% Comment: ranges from 79%-89%  BMI 45.34 kg/m        Assessment & Plan:  1: Acute on chronic heart failure with reduced ejection fraction- Patient presents with worsening fatigue, shortness of breath, edema and weight gain. Furosemide increased to 80mg  BID along with potassium 40mg  BID via phone call on 08/29/15 but he says that he's continued to gain weight and get more swollen. By our scale, he's gained 13.8 pounds since he was last here on 08/15/15. Patient is hypoxic with sats in the upper 70's to upper 80's. 3+ pitting edema in bilateral lower legs to bilateral knees. Based on exam and worsening  symptoms, advised patient that he needed to go to the ER for evaluation and further treatment. Patient is in agreement. ER was called to let them know patient was coming and patient was escorted via volunteer who pushed patient to the ER in a wheelchair. 2: HTN- Blood pressure markedly elevated here. Going straight to the ER.  Continues to not be able to afford his CPAP so, therefore, his sleep apnea continues to not be treated.   Will followup with patient after admission.

## 2015-09-01 NOTE — ED Notes (Signed)
MD at bedside. 

## 2015-09-01 NOTE — H&P (Signed)
[place] [facility] [place] NAME: Bradley Mckenzie    MR#:  [id number]  DATE OF BIRTH:  05/12/1965  DATE OF ADMISSION:  09/01/2015  PRIMARY CARE PHYSICIAN: Nat Christen, PA-C   REQUESTING/REFERRING PHYSICIAN: Rudene Re, MD  CHIEF COMPLAINT:   Shortness of breath HISTORY OF PRESENT ILLNESS:  Bradley Mckenzie  is a 50 y.o. male with a known history of Congestive heart failure (EF of 40-45%, last echo 03/17), COPD, diabetes, hyperlipidemia, hypertension, obesity, OSA noncompliant with CPAP since 2012 is presenting to the emergency department with a chief complaint of shortness of breath. He has gained 12 pounds of weight in the past one week. Patient's stroke with his cardiologist who has recommended the patient to take 80 mg of Lasix. Patient is still short of breath though he has been taking increased dose of Lasix. Also feels tight in his chest but denies any cough or fever. Denies any sick contacts. Patient went to follow-up with Dr. today and patient being hypoxic with sats in the upper 70s he was sent over to the emergency department. Patient also is reporting that he has swollen legs and abdomen. Patient was unable to recall his cardiologist's name. He reports that he was seen by Darylene Price at CHF clinic.   PAST MEDICAL HISTORY:   Past Medical History:  Diagnosis Date  . Cardiomyopathy ([place])    a. 01/2014 Echo: EF 25-30%;  b. ? ischemic vs non-ischemic.  He's never had an ischemic eval.  . Chronic combined systolic (congestive) and diastolic (congestive) heart failure ([place])    a. 01/2014 Echo: EF 25-30%. b. Echo 03/2015: Improved EF of 40-45%  . Depression   . Diabetes mellitus without complication ([place])    a. Not previously on outpt meds.  . Hyperlipidemia   . Hypertensive heart disease    a. Since his 23's.  . Morbid obesity ([place])   . Obstructive sleep apnea    a. Has not used CPAP since ~ 2012.    PAST SURGICAL HISTOIRY:    Past Surgical History:  Procedure Laterality Date  . HERNIA REPAIR  2004    SOCIAL HISTORY:   Social History  Substance Use Topics  . Smoking status: Former Research scientist (life sciences)  . Smokeless tobacco: Never Used     Comment: Quit in his 20's.  . Alcohol use 0.0 oz/week     Comment: occassional alcohol use at special events    FAMILY HISTORY:   Family History  Problem Relation Age of Onset  . Heart disease Mother     alive & well.  [name] Heart attack Father 57    died @ 36 of cancer.  . Diabetes Father   . Cancer Father   . Cancer Sister     DRUG ALLERGIES:  No Known Allergies  REVIEW OF SYSTEMS:  CONSTITUTIONAL: No fever, fatigue or weakness.  EYES: No blurred or double vision.  EARS, NOSE, AND THROAT: No tinnitus or ear pain.  RESPIRATORY: No cough,Reports worsening of shortness of breath, tightness in his chest, denies wheezing or hemoptysis.  CARDIOVASCULAR: No chest pain, orthopnea, edema.  GASTROINTESTINAL: No nausea, vomiting, diarrhea or abdominal pain.  GENITOURINARY: No dysuria, hematuria.  ENDOCRINE: No polyuria, nocturia,  HEMATOLOGY: No anemia, easy bruising or bleeding SKIN: No rash or lesion. Swelling in his legs and abdomen MUSCULOSKELETAL: No joint pain or arthritis.   NEUROLOGIC: No tingling, numbness, weakness.  PSYCHIATRY: No anxiety or depression.   MEDICATIONS AT HOME:   Prior  to Admission medications   Medication Sig Start Date End Date Taking? Authorizing Provider  albuterol (PROVENTIL HFA;VENTOLIN HFA) 108 (90 Base) MCG/ACT inhaler Inhale 2 puffs into the lungs every 6 (six) hours as needed for wheezing or shortness of breath. Reported on 07/08/2015 07/10/15  Yes Vaughan Basta, MD  aspirin EC 81 MG tablet Take 1 tablet (81 mg total) by mouth daily. 07/10/15  Yes Vaughan Basta, MD  carvedilol (COREG) 12.5 MG tablet Take 1 tablet (12.5 mg total) by mouth 2 (two) times daily with a meal. 07/10/15  Yes Vaughan Basta, MD  lisinopril  (PRINIVIL,ZESTRIL) 20 MG tablet Take 1 tablet (20 mg total) by mouth daily. 07/10/15  Yes Vaughan Basta, MD  potassium chloride SA (K-DUR,KLOR-CON) 20 MEQ tablet Take 1 tablet (20 mEq total) by mouth 2 (two) times daily. 07/10/15  Yes Vaughan Basta, MD  simvastatin (ZOCOR) 40 MG tablet Take 1 tablet (40 mg total) by mouth daily. 07/10/15  Yes Vaughan Basta, MD      VITAL SIGNS:  Blood pressure (!) 164/99, pulse 81, temperature 97.7 F (36.5 C), temperature source Oral, resp. rate (!) 22, height 5\' 7"  (1.702 m), weight (!) 139.3 kg (307 lb), SpO2 98 %.  PHYSICAL EXAMINATION:  GENERAL:  50 y.o.-year-old patient lying in the bed with no acute distress.  EYES: Pupils equal, round, reactive to light and accommodation. No scleral icterus. Extraocular muscles intact.  HEENT: Head atraumatic, normocephalic. Oropharynx and nasopharynx clear.  NECK:  Supple, no jugular venous distention. No thyroid enlargement, no tenderness.  LUNGS: Diminished breath sounds bilaterally, no wheezing, positive rales,rhonchi, no crepitation. No use of accessory muscles of respiration.  CARDIOVASCULAR: S1, S2 normal. No murmurs, rubs, or gallops.  ABDOMEN: Soft, nontender, nondistended. Bowel sounds present. No organomegaly or mass.  EXTREMITIES: 2 +pedal edema, No cyanosis, or clubbing.  NEUROLOGIC: Cranial nerves II through XII are intact. Muscle strength 5/5 in all extremities. Sensation intact. Gait not checked.  PSYCHIATRIC: The patient is alert and oriented x 3.  SKIN: No obvious rash, lesion, or ulcer.   LABORATORY PANEL:   CBC  Recent Labs Lab 09/01/15 1122  WBC 11.2*  HGB 12.2*  HCT 36.9*  PLT 235   ------------------------------------------------------------------------------------------------------------------  Chemistries   Recent Labs Lab 09/01/15 1122  NA 141  K 3.7  CL 108  CO2 24  GLUCOSE 140*  BUN 24*  CREATININE 1.32*  CALCIUM 9.2    ------------------------------------------------------------------------------------------------------------------  Cardiac Enzymes  Recent Labs Lab 09/01/15 1122  TROPONINI 0.04*   ------------------------------------------------------------------------------------------------------------------  RADIOLOGY:  Dg Chest Portable 1 View  Result Date: 09/01/2015 CLINICAL DATA:  Shortness of breath for several days EXAM: PORTABLE CHEST 1 VIEW COMPARISON:  July 09, 2015 FINDINGS: There is no edema or consolidation. There is generalized cardiomegaly. Pulmonary vascularity is normal. No adenopathy. No bone lesions. IMPRESSION: Cardiomegaly.  No edema or consolidation. Electronically Signed   By: Lowella Grip III M.D.   On: 09/01/2015 11:18    EKG:   Orders placed or performed during the hospital encounter of 09/01/15  . ED EKG  . ED EKG    IMPRESSION AND PLAN:   [name] is coming to the ED with worsening of shortness of breath and weight gain of 12 pounds in the past one week, no improvement with the doubling of diuretics by his cardiologist patient could not recall his cardiologist name  #Acute hypoxic respiratory failure secondary to CHF exacerbation and COPD exacerbation Patient seems to be noncompliant with his medications and doctors according  to old records Will give him IV Lasix and IV Solu-Medrol  #Acute exacerbation of congestive heart failure-both systolic and diastolic Monitor patient on telemetry Lasix IV Daily weights and intake and output monitoring Cardiology consult is placed to unassigned cardiologist as patient could not recall his cardiologist name Echocardiogram is ordered Provide aspirin Cycle troponins Patient is refusing BiPAP  #Acute COPD exacerbation  Solu-Medrol, DuoNeb nebulizer treatments and albuterol every laser treatments as needed basis Will try BiPAP if  Patient is cooperative  #Obstructive sleep apnea Patient is noncompliant with  CPAP  #Diabetes mellitus sliding scale insulin and diabetic diet  #DVT prophylaxis with Lovenox subcutaneous   All the records are reviewed and case discussed with ED provider. Management plans discussed with the patient, family and they are in agreement.  CODE STATUS: fc/ fianc  TOTAL CRITICAL CARE TIME TAKING CARE OF THIS PATIENT: 45 minutes.   Note: This dictation was prepared with Dragon dictation along with smaller phrase technology. Any transcriptional errors that result from this process are unintentional.  Bradley Mckenzie M.D on 09/01/2015 at 2:43 PM  Between 7am to 6pm - Pager - [phone]  After 6pm go to [email or web address] - password [email or web address] [facility]  [facility]  Office  [phone]  CC: Primary care physician; Nat Christen, PA-C

## 2015-09-01 NOTE — ED Provider Notes (Signed)
[facility] Emergency Department Provider Note  ____________________________________________  Time seen: Approximately 10:51 AM  I have reviewed the triage vital signs and the nursing notes.   HISTORY  Chief Complaint Shortness of Breath   HPI Bradley Mckenzie. is a 50 y.o. male with a history of CHF (EF of 40-45%, last echo 03/17), COPD, diabetes, hyperlipidemia, hypertension, obesity, OSA noncompliant with CPAP since 2012 presents for evaluation of shortness of breath. Patient reports that he has gained 12 pounds over the course of the last week. He spoke with his cardiologist 3 days ago who recommended that he doubled his Lasix dose. Patient reports he has been taking 80 mg a day since then. He continues to have shortness of breath that is worse with exertion. Patient denies wheezing and cough and fever. Does report no improvement of his symptoms with his inhalers at home. Today had a follow-up with his doctor and was sent here for sats in the upper 70s. Patient reports that he has had intermittent chest pain at rest and on exertion over the course of the last week with the last one yesterday. No chest pain at this time. He reports the shortness of breath is moderate to severe, constant, worse in the last day. He also reports severe edema bilateral lower extremities and on his abdomen. Patient reports the symptoms feel like his CHF exacerbation.  Past Medical History:  Diagnosis Date  . Cardiomyopathy ([place])    a. 01/2014 Echo: EF 25-30%;  b. ? ischemic vs non-ischemic.  He's never had an ischemic eval.  . Chronic combined systolic (congestive) and diastolic (congestive) heart failure ([place])    a. 01/2014 Echo: EF 25-30%. b. Echo 03/2015: Improved EF of 40-45%  . Depression   . Diabetes mellitus without complication ([place])    a. Not previously on outpt meds.  . Hyperlipidemia   . Hypertensive heart disease    a. Since his 62's.  . Morbid obesity ([place])   .  Obstructive sleep apnea    a. Has not used CPAP since ~ 2012.    Patient Active Problem List   Diagnosis Date Noted  . Uncontrolled hypertension 07/08/2015  . Cardiomyopathy ([place])   . Hyperlipidemia   . Morbid obesity ([place])   . Diabetes mellitus without complication ([place])   . Hypertensive heart disease   . Sinusitis 11/11/2014  . Tachycardia 09/12/2014  . COPD (chronic obstructive pulmonary disease) ([place]) 08/05/2014  . Chronic systolic heart failure ([place]) 06/16/2014  . Obstructive sleep apnea 06/16/2014  . Essential hypertension 06/16/2014  . H/O respiratory system disease 02/24/2014    Past Surgical History:  Procedure Laterality Date  . HERNIA REPAIR  2004    Prior to Admission medications   Medication Sig Start Date End Date Taking? Authorizing Provider  albuterol (PROVENTIL HFA;VENTOLIN HFA) 108 (90 Base) MCG/ACT inhaler Inhale 2 puffs into the lungs every 6 (six) hours as needed for wheezing or shortness of breath. Reported on 07/08/2015 07/10/15   Vaughan Basta, MD  aspirin EC 81 MG tablet Take 1 tablet (81 mg total) by mouth daily. 07/10/15   Vaughan Basta, MD  carvedilol (COREG) 12.5 MG tablet Take 1 tablet (12.5 mg total) by mouth 2 (two) times daily with a meal. 07/10/15   Vaughan Basta, MD  furosemide (LASIX) 40 MG tablet Take 1 tablet (40 mg total) by mouth 2 (two) times daily. 08/15/15   Alisa Graff, FNP  hydrALAZINE (APRESOLINE) 25 MG tablet Take 1 tablet (25 mg  total) by mouth 3 (three) times daily. 07/10/15   Vaughan Basta, MD  lisinopril (PRINIVIL,ZESTRIL) 20 MG tablet Take 1 tablet (20 mg total) by mouth daily. 07/10/15   Vaughan Basta, MD  potassium chloride SA (K-DUR,KLOR-CON) 20 MEQ tablet Take 1 tablet (20 mEq total) by mouth 2 (two) times daily. 07/10/15   Vaughan Basta, MD  simvastatin (ZOCOR) 40 MG tablet Take 1 tablet (40 mg total) by mouth daily. 07/10/15   Vaughan Basta, MD    Allergies Review of  patient's allergies indicates no known allergies.  Family History  Problem Relation Age of Onset  . Heart disease Mother     alive & well.  [name] Heart attack Father 59    died @ 60 of cancer.  . Diabetes Father   . Cancer Father   . Cancer Sister     Social History Social History  Substance Use Topics  . Smoking status: Former Research scientist (life sciences)  . Smokeless tobacco: Never Used     Comment: Quit in his 20's.  . Alcohol use 0.0 oz/week     Comment: occassional alcohol use at special events    Review of Systems  Constitutional: Negative for fever. + weight gain Eyes: Negative for visual changes. ENT: Negative for sore throat. Cardiovascular: + chest pain. Respiratory: + shortness of breath. Gastrointestinal: Negative for abdominal pain, vomiting or diarrhea. Genitourinary: Negative for dysuria. Musculoskeletal: Negative for back pain. + leg swelling Skin: Negative for rash. Neurological: Negative for headaches, weakness or numbness.  ____________________________________________   PHYSICAL EXAM:  VITAL SIGNS: ED Triage Vitals  Enc Vitals Group     BP 09/01/15 1048 (!) 199/122     Pulse Rate 09/01/15 1048 93     Resp 09/01/15 1048 (!) 35     Temp 09/01/15 1048 97.7 F (36.5 C)     Temp Source 09/01/15 1048 Oral     SpO2 09/01/15 1048 (!) 78 %     Weight 09/01/15 1050 (!) 307 lb (139.3 kg)     Height 09/01/15 1050 5\' 7"  (1.702 m)     Head Circumference --      Peak Flow --      Pain Score 09/01/15 1046 3     Pain Loc --      Pain Edu? --      Excl. in [place]? --     Constitutional: Alert and oriented. Well appearing and in no apparent distress. HEENT:      Head: Normocephalic and atraumatic.         Eyes: Conjunctivae are normal. Sclera is non-icteric. EOMI. PERRL      Mouth/Throat: Mucous membranes are moist.       Neck: Supple with no signs of meningismus. Cardiovascular: Regular rate and rhythm. No murmurs, gallops, or rubs. 2+ symmetrical distal pulses are present in  all extremities. JVD to earlobe. Respiratory: Increased work of breathing, tachypnea to the mid 30s, hypoxic to 78% on room air which improved on 5 L, diminished air movement on the bases. Faint scattered wheezes Gastrointestinal: Soft, non tender, distended with positive bowel sounds. No rebound or guarding. Genitourinary: No CVA tenderness. Musculoskeletal: 3+ pitting edema to bilateral knees  Neurologic: Normal speech and language. Face is symmetric. Moving all extremities. No gross focal neurologic deficits are appreciated. Skin: Skin is warm, dry and intact. No rash noted. Psychiatric: Mood and affect are normal. Speech and behavior are normal.  ____________________________________________   LABS (all labs ordered are listed, but only abnormal results are displayed)  Labs Reviewed  CBC WITH DIFFERENTIAL/PLATELET - Abnormal; Notable for the following:       Result Value   WBC 11.2 (*)    Hemoglobin 12.2 (*)    HCT 36.9 (*)    RDW 16.6 (*)    Neutro Abs 9.0 (*)    All other components within normal limits  BASIC METABOLIC PANEL - Abnormal; Notable for the following:    Glucose, Bld 140 (*)    BUN 24 (*)    Creatinine, Ser 1.32 (*)    All other components within normal limits  TROPONIN I - Abnormal; Notable for the following:    Troponin I 0.04 (*)    All other components within normal limits  BRAIN NATRIURETIC PEPTIDE - Abnormal; Notable for the following:    B Natriuretic Peptide 1,546.0 (*)    All other components within normal limits  URINALYSIS COMPLETEWITH MICROSCOPIC ([facility] ONLY) - Abnormal; Notable for the following:    Color, Urine YELLOW (*)    APPearance CLEAR (*)    Protein, ur 100 (*)    Squamous Epithelial / LPF 0-5 (*)    All other components within normal limits   ____________________________________________  EKG  ED ECG REPORT I, Rudene Re, the attending physician, personally viewed and interpreted this ECG.  Normal sinus rhythm, rate of 88,  normal intervals, left axis deviation, T-wave inversions on inferior lateral leads which are old from prior. No ST elevations. ____________________________________________  RADIOLOGY  CXR: Cardiomegaly.  No edema or consolidation ____________________________________________   PROCEDURES  Procedure(s) performed: None Procedures Critical Care performed: yes  CRITICAL CARE Performed by: Rudene Re  ?  Total critical care time: 40 min  Critical care time was exclusive of separately billable procedures and treating other patients.  Critical care was necessary to treat or prevent imminent or life-threatening deterioration.  Critical care was time spent personally by me on the following activities: development of treatment plan with patient and/or surrogate as well as nursing, discussions with consultants, evaluation of patient's response to treatment, examination of patient, obtaining history from patient or surrogate, ordering and performing treatments and interventions, ordering and review of laboratory studies, ordering and review of radiographic studies, pulse oximetry and re-evaluation of patient's condition.  ____________________________________________   INITIAL IMPRESSION / ASSESSMENT AND PLAN / ED COURSE   50 y.o. male with a history of CHF (EF of 40-45%, last echo 03/17), COPD, diabetes, hyperlipidemia, hypertension, obesity, OSA noncompliant with CPAP since 2012 presents for evaluation of shortness of breath, 12lb weight gain in 1 week, pitting edema. Patient with moderately increased work of breathing, tachypnea, hypoxic with new oxygen requirement, elevated JVD, 3+ pitting edema on bilateral lower extremities with decreased air movement on bilateral bases. Patient is also hypertensive. Presentation concerning for CHF exacerbation with pulmonary edema. Will start patient on BiPAP, sublingual nitroglycerin, 80 mg of IV Lasix. Will get EKG, labs, chest x-ray  Clinical  Course  Comment By Time  BNP 1546. BP improved. Patient is making urine. Will attempt to DC BiPAP. Will consult hospitalist for admission, [place], MD 08/18 1231    Pertinent labs & imaging results that were available during my care of the patient were reviewed by me and considered in my medical decision making (see chart for details).    ____________________________________________   FINAL CLINICAL IMPRESSION(S) / ED DIAGNOSES  Final diagnoses:  Acute respiratory failure with hypoxia (HCC)  Acute on chronic congestive heart failure, unspecified congestive heart failure type ([place])  NEW MEDICATIONS STARTED DURING THIS VISIT:  New Prescriptions   No medications on file     Note:  This document was prepared using Dragon voice recognition software and may include unintentional dictation errors.    Rudene Re, MD 09/01/15 [id number]

## 2015-09-01 NOTE — Patient Instructions (Signed)
Being wheeled to the ER with [facility] volunteer.

## 2015-09-01 NOTE — Progress Notes (Signed)
Pt is refusing Bi-PAP at this time. Pt states he is extremely hungry and will not continue with treatment if he is denied food. Dr. Margaretmary Eddy notified and she stated that since pt is refusing treatment, he can eat and we will trial him on a nasal cannula. Will continue to monitor pt's respiratory status and compliance with treatment while he is on the unit.

## 2015-09-02 ENCOUNTER — Inpatient Hospital Stay (HOSPITAL_COMMUNITY)
Admit: 2015-09-02 | Discharge: 2015-09-02 | Disposition: A | Discharge status: Home / Self Care | Payer: Self-pay | Attending: Internal Medicine | Admitting: Internal Medicine

## 2015-09-02 ENCOUNTER — Inpatient Hospital Stay: Payer: Self-pay

## 2015-09-02 DIAGNOSIS — I5023 Acute on chronic systolic (congestive) heart failure: Secondary | ICD-10-CM

## 2015-09-02 DIAGNOSIS — I340 Nonrheumatic mitral (valve) insufficiency: Secondary | ICD-10-CM

## 2015-09-02 DIAGNOSIS — I5043 Acute on chronic combined systolic (congestive) and diastolic (congestive) heart failure: Secondary | ICD-10-CM

## 2015-09-02 LAB — BASIC METABOLIC PANEL
ANION GAP: 7 (ref 5–15)
BUN: 22 mg/dL — ABNORMAL HIGH (ref 6–20)
CALCIUM: 9.1 mg/dL (ref 8.9–10.3)
CO2: 32 mmol/L (ref 22–32)
CREATININE: 1.27 mg/dL — AB (ref 0.61–1.24)
Chloride: 102 mmol/L (ref 101–111)
Glucose, Bld: 179 mg/dL — ABNORMAL HIGH (ref 65–99)
Potassium: 3.9 mmol/L (ref 3.5–5.1)
SODIUM: 141 mmol/L (ref 135–145)

## 2015-09-02 LAB — CBC
HEMATOCRIT: 41.9 % (ref 40.0–52.0)
HEMOGLOBIN: 13.8 g/dL (ref 13.0–18.0)
MCH: 27.6 pg (ref 26.0–34.0)
MCHC: 32.9 g/dL (ref 32.0–36.0)
MCV: 83.7 fL (ref 80.0–100.0)
Platelets: 247 10*3/uL (ref 150–440)
RBC: 5 MIL/uL (ref 4.40–5.90)
RDW: 16.5 % — AB (ref 11.5–14.5)
WBC: 10.4 10*3/uL (ref 3.8–10.6)

## 2015-09-02 LAB — ECHOCARDIOGRAM COMPLETE
HEIGHTINCHES: 69 in
WEIGHTICAEL: 4867.76 [oz_av]

## 2015-09-02 LAB — GLUCOSE, CAPILLARY
GLUCOSE-CAPILLARY: 244 mg/dL — AB (ref 65–99)
GLUCOSE-CAPILLARY: 237 mg/dL — AB (ref 65–99)
GLUCOSE-CAPILLARY: 117 mg/dL — AB (ref 65–99)

## 2015-09-02 LAB — MAGNESIUM: Magnesium: 1.9 mg/dL (ref 1.7–2.4)

## 2015-09-02 LAB — PHOSPHORUS: Phosphorus: 6 mg/dL — ABNORMAL HIGH (ref 2.5–4.6)

## 2015-09-02 MED ORDER — FUROSEMIDE 10 MG/ML IJ SOLN
5.0000 mg/h | INTRAVENOUS | Status: DC
  Administered 2015-09-02: 5 mg/h via INTRAVENOUS
  Filled 2015-09-02 (×2): qty 25

## 2015-09-02 MED ORDER — INSULIN ASPART 100 UNIT/ML ~~LOC~~ SOLN
0.0000 [IU] | Freq: Three times a day (TID) | SUBCUTANEOUS | Status: DC
  Administered 2015-09-02 (×2): 5 [IU] via SUBCUTANEOUS
  Administered 2015-09-03: 3 [IU] via SUBCUTANEOUS
  Administered 2015-09-03: 2 [IU] via SUBCUTANEOUS
  Administered 2015-09-05: 3 [IU] via SUBCUTANEOUS
  Filled 2015-09-02: qty 2
  Filled 2015-09-02: qty 5
  Filled 2015-09-02: qty 3
  Filled 2015-09-02: qty 1
  Filled 2015-09-02: qty 5

## 2015-09-02 MED ORDER — CETYLPYRIDINIUM CHLORIDE 0.05 % MT LIQD
7.0000 mL | Freq: Two times a day (BID) | OROMUCOSAL | Status: DC
  Administered 2015-09-02 – 2015-09-04 (×3): 7 mL via OROMUCOSAL

## 2015-09-02 NOTE — Progress Notes (Signed)
Pt occasionally uncooperative and irritable. 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 Refused Lovenox. Sat in recliner all night. Requesting meals, but explained cafeteria is closed at night to patients. Offered crackers and drinks. SR on the monitor. Blood pressure elevated and was medicated with Hydralazine. On IV Lasix as well with good urinary output.  Does not want pink foam sacrum prophylactic dressing or scds.

## 2015-09-02 NOTE — Consult Note (Signed)
[facility] CONSULT NOTE    Date: 09/02/2015                  Patient Name:  Bradley Mckenzie.  MRN: [id number]  DOB: 25-Jun-1965  Age / Sex: 50 y.o., male         PCP: Vilinda Boehringer, MD                 Service Requesting Consult: Dr. Stevenson Clinch                 Reason for Consult: CKD stage II, hypertension, proteinuria            History of Present Illness: Patient is a 50 y.o. male with a PMHx of Congestive heart failure, ejection fraction 40-45%, COPD, diabetes mellitus type 2, hyperlipidemia, hypertension, obesity, obstructive sleep apnea, not compliant with CPAP who was admitted to [facility] on 09/01/2015 for evaluation of shortness of breath, orthopnea, dyspnea with minimal exertion. The patient has known history of chronic systolic heart failure. This appears to be acutely exacerbated now. He's had at least a 12 pound weight gain in the past week. He does not have a primary care physician. He's had some dietary indiscretion.  Blood pressure was quite high. Upon presentation at 209/121. Currently blood pressure is 142/72. He has been started on Lasix 40 mg IV every 12 hours. Patient is still having some shortness of breath the same. We are asked to see him for chronic kidney disease stage II. Baseline creatinine appears to be 1.2-1.3. There is also associated proteinuria. The patient has not seen a nephrologist in the past.   Medications: Outpatient medications: Prescriptions Prior to Admission  Medication Sig Dispense Refill Last Dose  . albuterol (PROVENTIL HFA;VENTOLIN HFA) 108 (90 Base) MCG/ACT inhaler Inhale 2 puffs into the lungs every 6 (six) hours as needed for wheezing or shortness of breath. Reported on 07/08/2015 1 Inhaler 3 09/01/2015 at 0800  . aspirin EC 81 MG tablet Take 1 tablet (81 mg total) by mouth daily. 30 tablet 3 09/01/2015 at 0800  . carvedilol (COREG) 12.5 MG tablet Take 1 tablet (12.5 mg total) by mouth 2 (two) times daily with a meal. 60 tablet 3  09/01/2015 at 0800  . lisinopril (PRINIVIL,ZESTRIL) 20 MG tablet Take 1 tablet (20 mg total) by mouth daily. 30 tablet 3 09/01/2015 at 0800  . potassium chloride SA (K-DUR,KLOR-CON) 20 MEQ tablet Take 1 tablet (20 mEq total) by mouth 2 (two) times daily. 90 tablet 3 09/01/2015 at 0800  . simvastatin (ZOCOR) 40 MG tablet Take 1 tablet (40 mg total) by mouth daily. 30 tablet 3 09/01/2015 at 0800    Current medications: Current Facility-Administered Medications  Medication Dose Route Frequency Provider Last Rate Last Dose  . 0.9 %  sodium chloride infusion  250 mL Intravenous PRN Nicholes Mango, MD      . acetaminophen (TYLENOL) tablet 650 mg  650 mg Oral Q6H PRN Nicholes Mango, MD       Or  . acetaminophen (TYLENOL) suppository 650 mg  650 mg Rectal Q6H PRN Aruna Gouru, MD      . albuterol (PROVENTIL) (2.5 MG/3ML) 0.083% nebulizer solution 2.5 mg  2.5 mg Nebulization Q4H PRN Nicholes Mango, MD      . antiseptic oral rinse (CPC / CETYLPYRIDINIUM CHLORIDE 0.05%) solution 7 mL  7 mL Mouth Rinse q12n4p Aruna Gouru, MD   7 mL at 09/01/15 1645  . aspirin EC tablet 81 mg  81  mg Oral Daily Nicholes Mango, MD      . carvedilol (COREG) tablet 12.5 mg  12.5 mg Oral BID WC Nicholes Mango, MD   12.5 mg at 09/02/15 0810  . chlorhexidine (PERIDEX) 0.12 % solution 15 mL  15 mL Mouth Rinse BID Nicholes Mango, MD   15 mL at 09/01/15 2120  . docusate sodium (COLACE) capsule 100 mg  100 mg Oral BID Nicholes Mango, MD   100 mg at 09/01/15 2117  . enoxaparin (LOVENOX) injection 40 mg  40 mg Subcutaneous Q12H Aruna Gouru, MD      . furosemide (LASIX) 250 mg in dextrose 5 % 250 mL (1 mg/mL) infusion  5 mg/hr Intravenous Continuous Miken Stecher, MD      . hydrALAZINE (APRESOLINE) injection 20 mg  20 mg Intravenous Q4H PRN Vishal Mungal, MD   20 mg at 09/01/15 2014  . insulin aspart (novoLOG) injection 0-15 Units  0-15 Units Subcutaneous TID WC Mikael Spray, NP   5 Units at 09/02/15 0810  . ipratropium-albuterol (DUONEB) 0.5-2.5 (3)  MG/3ML nebulizer solution 3 mL  3 mL Nebulization Q6H Nicholes Mango, MD   3 mL at 09/02/15 0805  . lisinopril (PRINIVIL,ZESTRIL) tablet 20 mg  20 mg Oral Daily Aruna Gouru, MD      . nitroGLYCERIN (NITROSTAT) SL tablet 0.4 mg  0.4 mg Sublingual Q5 min PRN Rudene Re, MD      . ondansetron [facility]) tablet 4 mg  4 mg Oral Q6H PRN Nicholes Mango, MD       Or  . ondansetron (ZOFRAN) injection 4 mg  4 mg Intravenous Q6H PRN Aruna Gouru, MD      . potassium chloride SA (K-DUR,KLOR-CON) CR tablet 20 mEq  20 mEq Oral BID Nicholes Mango, MD   20 mEq at 09/01/15 2118  . simvastatin (ZOCOR) tablet 40 mg  40 mg Oral Daily Aruna Gouru, MD      . sodium chloride flush (NS) 0.9 % injection 3 mL  3 mL Intravenous Q12H Nicholes Mango, MD   3 mL at 09/01/15 2117  . sodium chloride flush (NS) 0.9 % injection 3 mL  3 mL Intravenous Q12H Nicholes Mango, MD   3 mL at 09/01/15 2117  . sodium chloride flush (NS) 0.9 % injection 3 mL  3 mL Intravenous PRN Nicholes Mango, MD          Allergies: No Known Allergies    Past Medical History: Past Medical History:  Diagnosis Date  . Cardiomyopathy ([place])    a. 01/2014 Echo: EF 25-30%;  b. ? ischemic vs non-ischemic.  He's never had an ischemic eval.  . Chronic combined systolic (congestive) and diastolic (congestive) heart failure ([place])    a. 01/2014 Echo: EF 25-30%. b. Echo 03/2015: Improved EF of 40-45%  . Depression   . Diabetes mellitus without complication ([place])    a. Not previously on outpt meds.  . Hyperlipidemia   . Hypertensive heart disease    a. Since his 63's.  . Morbid obesity ([place])   . Obstructive sleep apnea    a. Has not used CPAP since ~ 2012.     Past Surgical History: Past Surgical History:  Procedure Laterality Date  . HERNIA REPAIR  2004     Family History: Family History  Problem Relation Age of Onset  . Heart disease Mother     alive & well.  [name] Heart attack Father 23    died @ 39 of cancer.  . Diabetes Father   .  Cancer Father   .  Cancer Sister      Social History: Social History   Social History  . Marital status: Single    Spouse name: N/A  . Number of children: N/A  . Years of education: N/A   Occupational History  . 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 Smoking status: Former Research scientist (life sciences)  . Smokeless tobacco: Never Used     Comment: Quit in his 20's.  . Alcohol use 0.0 oz/week     Comment: occassional alcohol use at special events  . Drug use: No     Comment: Used marijuana in his teens.  . Sexual activity: Not on file   Other Topics Concern  . Not on file   Social History Narrative   Lives in [place] with his [place].  She runs a group home and they live on the premises.  He works as a Actuary for the cognitively challenged.     Review of Systems: Review of Systems  Constitutional: Positive for malaise/fatigue. Negative for chills and fever.  HENT: Negative for hearing loss and tinnitus.   Eyes: Negative for blurred vision.  Respiratory: Positive for shortness of breath. Negative for cough and hemoptysis.   Cardiovascular: Positive for orthopnea, leg swelling and PND. Negative for chest pain and palpitations.  Gastrointestinal: Negative for abdominal pain, heartburn, nausea and vomiting.  Genitourinary: Negative for dysuria, frequency and urgency.  Musculoskeletal: Positive for back pain. Negative for myalgias.  Skin: Negative for itching and rash.  Neurological: Negative for dizziness, focal weakness, loss of consciousness and headaches.  Endo/Heme/Allergies: Negative for polydipsia. Does not bruise/bleed easily.  Psychiatric/Behavioral: Negative for depression. The patient is not nervous/anxious.      Vital Signs: Blood pressure (!) 142/72, pulse 65, temperature 97.8 F (36.6 C), temperature source Oral, resp. rate 17, height 5\' 9"  (1.753 m), weight (!) 138 kg (304 lb 3.8 oz), SpO2 95 %.  Weight trends: Filed Weights   09/01/15 1050 09/01/15 1630   Weight: (!) 139.3 kg (307 lb) (!) 138 kg (304 lb 3.8 oz)    Physical Exam: General: NAD, sitting up in chair  Head: Normocephalic, atraumatic.  Eyes: Anicteric, EOMI  Nose: Mucous membranes moist, not inflammed, nonerythematous.  Throat: Oropharynx nonerythematous, no exudate appreciated.   Neck: Supple, trachea midline.  Lungs:  Normal respiratory effort. Basilar rales  Heart: RRR. S1 and S2 no rubs  Abdomen:  Soft NTND BS present.  Extremities: 3+ b/l LE edema, up to lower abdomen  Neurologic: A&O X3, Motor strength is 5/5 in the all 4 extremities  Skin: No visible rashes, scars.    Lab results: Basic Metabolic Panel:  Recent Labs Lab 09/01/15 1122 09/02/15 0449  NA 141 141  K 3.7 3.9  CL 108 102  CO2 24 32  GLUCOSE 140* 179*  BUN 24* 22*  CREATININE 1.32* 1.27*  CALCIUM 9.2 9.1  MG  --  1.9  PHOS  --  6.0*    Liver Function Tests: No results for input(s): AST, ALT, ALKPHOS, BILITOT, PROT, ALBUMIN in the last 168 hours. No results for input(s): LIPASE, AMYLASE in the last 168 hours. No results for input(s): AMMONIA in the last 168 hours.  CBC:  Recent Labs Lab 09/01/15 1122 09/02/15 0449  WBC 11.2* 10.4  NEUTROABS 9.0*  --   HGB 12.2* 13.8  HCT 36.9* 41.9  MCV 83.4 83.7  PLT 235 247    Cardiac Enzymes:  Recent Labs Lab 09/01/15 1122  TROPONINI 0.04*    BNP: Invalid input(s): POCBNP  CBG:  Recent Labs Lab 09/02/15 0747  GLUCAP 28*    Microbiology: Results for orders placed or performed during the hospital encounter of 09/01/15  MRSA PCR Screening     Status: None   Collection Time: 09/01/15  4:31 PM  Result Value Ref Range Status   MRSA by PCR NEGATIVE NEGATIVE Final    Comment:        The GeneXpert MRSA Assay (FDA approved for NASAL specimens only), is one component of a comprehensive MRSA colonization surveillance program. It is not intended to diagnose MRSA infection nor to guide or monitor treatment for MRSA  infections.     Coagulation Studies: No results for input(s): LABPROT, INR in the last 72 hours.  Urinalysis:  Recent Labs  09/01/15 1122  COLORURINE YELLOW*  LABSPEC 1.014  PHURINE 6.0  GLUCOSEU NEGATIVE  HGBUR NEGATIVE  BILIRUBINUR NEGATIVE  KETONESUR NEGATIVE  PROTEINUR 100*  NITRITE NEGATIVE  LEUKOCYTESUR NEGATIVE      Imaging: Dg Chest Portable 1 View  Result Date: 09/01/2015 CLINICAL DATA:  Shortness of breath for several days EXAM: PORTABLE CHEST 1 VIEW COMPARISON:  July 09, 2015 FINDINGS: There is no edema or consolidation. There is generalized cardiomegaly. Pulmonary vascularity is normal. No adenopathy. No bone lesions. IMPRESSION: Cardiomegaly.  No edema or consolidation. Electronically Signed   By: Lowella Grip III M.D.   On: 09/01/2015 11:18      Assessment & Plan: Pt is a 50 y.o. male with a PMHx of Congestive heart failure, ejection fraction 40-45%, COPD, diabetes mellitus type 2, hyperlipidemia, hypertension, obesity, obstructive sleep apnea, not compliant with CPAP who was admitted to [facility] on 09/01/2015 for evaluation of shortness of breath, orthopnea, dyspnea with minimal exertion.   1. Chronic kidney disease stage II/proteinuria. In reviewing labs, it appears that the patient does have underlying chronic kidney disease stage II, particularly in the setting of proteinuria. Patient has been started on an ACE inhibitor, which we will continue. We will also obtain renal ultrasound for further evaluation.  Most recent hemoglobin A1c was 7.8, and we recommend a hemoglobin A1c of 7 or less. We also advised the patient on hypertension control.  2. Hypertension.  Continue Coreg, lisinopril.  Consider adding hydralazine as well as a nitrate. Given his underlying congestive heart failure.  3. Acute systolic heart failure. Ejection fraction most recently was 40-45%. We will convert the patient to a Lasix drip. Continue to monitor the patient's progress  closely.  4. Thanks for consultation.

## 2015-09-02 NOTE — Consult Note (Signed)
CARDIOLOGY CONSULT NOTE       Patient ID: Bradley Mckenzie. MRN: [id number] DOB/AGE: Dec 21, 1965 50 y.o.  Admit date: 09/01/2015 Referring Physician:  Posey Pronto Primary Physician: Vilinda Boehringer, MD Primary Cardiologist: Rockey Situ Reason for Consultation:  CHF  Active Problems:   Acute respiratory failure ([place])   Acute on chronic congestive heart failure ([place])   Acute respiratory failure with hypoxia (H[facility])   OSA on CPAP   Obesity   HPI:  50 y.o. with history of cardiomyopathy presumed non ischemic but never been cathed. Difficult social situation unemployed and has a hard time affording drugs and eating right. Increasing LE edema , dyspnea, weight and CHF Seen at CHF clinic yesterday and admitted for diuresis.  No chest pain palpitations or syncope.  CRF;s HTN, DM Elevated lipids. Obese with OSA has CPAP ? Regular use. BNP elevated 1546  CXR with CE but no edema  ROS All other systems reviewed and negative except as noted above  Past Medical History:  Diagnosis Date  . Cardiomyopathy ([place])    a. 01/2014 Echo: EF 25-30%;  b. ? ischemic vs non-ischemic.  He's never had an ischemic eval.  . Chronic combined systolic (congestive) and diastolic (congestive) heart failure ([place])    a. 01/2014 Echo: EF 25-30%. b. Echo 03/2015: Improved EF of 40-45%  . Depression   . Diabetes mellitus without complication ([place])    a. Not previously on outpt meds.  . Hyperlipidemia   . Hypertensive heart disease    a. Since his 24's.  . Morbid obesity ([place])   . Obstructive sleep apnea    a. Has not used CPAP since ~ 2012.    Family History  Problem Relation Age of Onset  . Heart disease Mother     alive & well.  [name] Heart attack Father 14    died @ 37 of cancer.  . Diabetes Father   . Cancer Father   . Cancer Sister     Social History   Social History  . Marital status: Single    Spouse name: N/A  . Number of children: N/A  . Years of education: N/A   Occupational History  . 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 Smoking status: Former Research scientist (life sciences)  . Smokeless tobacco: Never Used     Comment: Quit in his 20's.  . Alcohol use 0.0 oz/week     Comment: occassional alcohol use at special events  . Drug use: No     Comment: Used marijuana in his teens.  . Sexual activity: Not on file   Other Topics Concern  . Not on file   Social History Narrative   Lives in [place] with his [place].  She runs a group home and they live on the premises.  He works as a Actuary for the cognitively challenged.    Past Surgical History:  Procedure Laterality Date  . HERNIA REPAIR  2004     . antiseptic oral rinse  7 mL Mouth Rinse BID  . aspirin EC  81 mg Oral Daily  . carvedilol  12.5 mg Oral BID WC  . docusate sodium  100 mg Oral BID  . enoxaparin (LOVENOX) injection  40 mg Subcutaneous Q12H  . insulin aspart  0-15 Units Subcutaneous TID WC  . ipratropium-albuterol  3 mL Nebulization Q6H  . lisinopril  20 mg Oral Daily  . potassium chloride SA  20 mEq Oral BID  . simvastatin  40 mg Oral Daily  . sodium chloride flush  3 mL Intravenous Q12H  . sodium chloride flush  3 mL Intravenous Q12H   . furosemide (LASIX) infusion 5 mg/hr (09/02/15 1022)    Physical Exam: Blood pressure (!) 158/92, pulse 80, temperature 97.8 F (36.6 C), temperature source Oral, resp. rate 17, height 5\' 9"  (1.753 m), weight (!) 304 lb 3.8 oz (138 kg), SpO2 97 %.    I/O's negative 5260 since admission    Affect appropriate Obese black male  HEENT: normal Neck supple with no adenopathy JVP normal no bruits no thyromegaly Lungs clear with no wheezing and good diaphragmatic motion Heart:  S1/S2 no murmur, no rub, gallop or click PMI normal Abdomen: benighn, BS positve, no tenderness, no AAA no bruit.  No HSM or HJR Distal pulses intact with no bruits Plus 2-3 bilateral edema Neuro non-focal Skin warm and dry No muscular weakness   Labs:   Lab Results  Component  Value Date   WBC 10.4 09/02/2015   HGB 13.8 09/02/2015   HCT 41.9 09/02/2015   MCV 83.7 09/02/2015   PLT 247 09/02/2015    Recent Labs Lab 09/02/15 0449  NA 141  K 3.9  CL 102  CO2 32  BUN 22*  CREATININE 1.27*  CALCIUM 9.1  GLUCOSE 179*   Lab Results  Component Value Date   CKMB 1.9 02/07/2014   TROPONINI 0.04 (HH) 09/01/2015    Lab Results  Component Value Date   CHOL 170 03/28/2015   CHOL 161 02/08/2014   Lab Results  Component Value Date   HDL 31 (L) 03/28/2015   HDL 33 (L) 02/08/2014   Lab Results  Component Value Date   LDLCALC 118 (H) 03/28/2015   LDLCALC 112 (H) 02/08/2014   Lab Results  Component Value Date   TRIG 103 03/28/2015   TRIG 79 02/08/2014   Lab Results  Component Value Date   CHOLHDL 5.5 03/28/2015   No results found for: LDLDIRECT    Radiology: US Renal  Result Date: 09/02/2015 CLINICAL DATA:  Chronic kidney disease stage 2. EXAM: RENAL / URINARY TRACT ULTRASOUND COMPLETE COMPARISON:  None. FINDINGS: Right Kidney: Length: 11.8 cm. Echogenicity within normal limits. No mass or hydronephrosis visualized. Left Kidney: Length: 13.4 cm. Echogenicity within normal limits. No mass or hydronephrosis visualized. Bladder: Appears normal for degree of bladder distention. IMPRESSION: Normal renal ultrasound. Electronically Signed   By: Lajean Manes M.D.   On: 09/02/2015 10:43   Dg Chest Portable 1 View  Result Date: 09/01/2015 CLINICAL DATA:  Shortness of breath for several days EXAM: PORTABLE CHEST 1 VIEW COMPARISON:  July 09, 2015 FINDINGS: There is no edema or consolidation. There is generalized cardiomegaly. Pulmonary vascularity is normal. No adenopathy. No bone lesions. IMPRESSION: Cardiomegaly.  No edema or consolidation. Electronically Signed   By: Lowella Grip III M.D.   On: 09/01/2015 11:18    EKG:  SR lateral T wave changes no acute changes compared to baseline    ASSESSMENT AND PLAN:  CHF:  Likely non ischemic given age ,  chronicity and lack of chest pain or old MI on ECG. Continue iv lasix And ACE and Coreg.  Has had good diuresis.  Echo being done at bedside at this time. Preliminary EF 35-40% With diffuse hypokinesis .  Will have Dr Rockey Situ decide Monday if he wants to proceed with right and left cath To define DCM further.   OSA:  Would put him on CPAP in hospital  and make sure home health sees him as outpatient to check equipment  Biggest issue is unemployment and inability to follow proper diet   Signed: Jenkins [place] 09/02/2015, 11:02 AM

## 2015-09-02 NOTE — Progress Notes (Signed)
Patient transferred from ICU to 2A. Ambulatory in the room. Lasix drip going at 5/hr. No complaints of pain. BP slightly elevated, will give night time coreg dose and re-check. NSR on tele.

## 2015-09-02 NOTE — Progress Notes (Signed)
IV to right hand noted to be occluded and unable to flush or draw back blood return. Same removed and pt refused a replacement IV. At this time, pt has access to the left hand.

## 2015-09-02 NOTE — Progress Notes (Signed)
PULMONARY / CRITICAL CARE MEDICINE   Name: Bradley Mckenzie. MRN: [id number] DOB: 1965/06/20    ADMISSION DATE:  09/01/2015   HISTORY OF PRESENT ILLNESS:   Male past medical history of systolic CHF, ejection fraction 40-45%, asthma, diabetes, hyperlipidemia, hypertension, obesity, OSA noncompliant with CPAP, sent to the ER from his heart failure clinic due to worsening shortness of breath over the past 2 weeks. He states that he saw heart failure clinic nurse today, his gained 12 pounds in the past 1 week, was recommended to take 80 mg of Lasix daily by heart failure clinic, however still with weight gain and shortness of breath. He denies any significant changes in his diet any fever, any nausea or any vomiting. Admits to chest tightness, and shortness of breath. Upon arrival to the ED he was noted to be hypoxic with sats in the upper 70s, he was placed on a nonrebreather/Ventimask at times, with saturations greater than 90%. He is transferred to the ICU for further monitoring and initiation of BiPAP. He states that he's had a history of hypertension for a number of years, he is a never smoker, he is sure why he is being labeled as a COPD patient. He states he was diagnosed with asthma about 3 years ago by a primary care physician who is mainly cannot recall.  SUBJECTIVE:  No acute issues overnight. Maintained on continuous BiPAP  From 2300. BP elevated; one dose of prn hydralazine given.    VITAL SIGNS: BP (!) 157/90   Pulse 73   Temp 97.4 F (36.3 C) (Axillary)   Resp (!) 23   Ht 5\' 9"  (1.753 m)   Wt (!) 304 lb 3.8 oz (138 kg)   SpO2 (!) 89%   BMI 44.93 kg/m   HEMODYNAMICS:    VENTILATOR SETTINGS: FiO2 (%):  [28 %-35 %] 28 %  INTAKE / OUTPUT: I/O last 3 completed shifts: In: -  Out: 1400 [Urine:1400]  PHYSICAL EXAMINATION: General: nad HEENT: PERRLA, oral mucosa moist, neck short, trachea midline Neuro: AAO X 4, no focal deficits Cardiovascular: RRR, S1/S2, no  MRG Lungs: Normal WOB, bilateral breath sounds, diminished in the bases, no wheezing Abdomen: Non-distended, normal bowel sounds  Musculoskeletal:  +rom; no deformities Extremities: +2 edema, +2 pulses Skin: warm and dry, no rash  LABS:  BMET  Recent Labs Lab 09/01/15 1122 09/02/15 0449  NA 141 141  K 3.7 3.9  CL 108 102  CO2 24 32  BUN 24* 22*  CREATININE 1.32* 1.27*  GLUCOSE 140* 179*    Electrolytes  Recent Labs Lab 09/01/15 1122 09/02/15 0449  CALCIUM 9.2 9.1  MG  --  1.9  PHOS  --  6.0*    CBC  Recent Labs Lab 09/01/15 1122 09/02/15 0449  WBC 11.2* 10.4  HGB 12.2* 13.8  HCT 36.9* 41.9  PLT 235 247    Coag's No results for input(s): APTT, INR in the last 168 hours.  Sepsis Markers No results for input(s): LATICACIDVEN, PROCALCITON, O2SATVEN in the last 168 hours.  ABG No results for input(s): PHART, PCO2ART, PO2ART in the last 168 hours.  Liver Enzymes No results for input(s): AST, ALT, ALKPHOS, BILITOT, ALBUMIN in the last 168 hours.  Cardiac Enzymes  Recent Labs Lab 09/01/15 1122  TROPONINI 0.04*    Glucose No results for input(s): GLUCAP in the last 168 hours.  Imaging Dg Chest Portable 1 View  Result Date: 09/01/2015 CLINICAL DATA:  Shortness of breath for several days EXAM: PORTABLE CHEST  1 VIEW COMPARISON:  July 09, 2015 FINDINGS: There is no edema or consolidation. There is generalized cardiomegaly. Pulmonary vascularity is normal. No adenopathy. No bone lesions. IMPRESSION: Cardiomegaly.  No edema or consolidation. Electronically Signed   By: Lowella Grip III M.D.   On: 09/01/2015 11:18   STUDIES:  03/2015 ECHO> mild LV dilation, 40-45 percent EF  CULTURES: MRSA screen negative  ANTIBIOTICS: None  SIGNIFICANT EVENTS: 8/18> shortness of breath worsening over the past 1 week, saw heart failure clinic, sent to ER due to 12 pound weight gain and hypoxia with sats in the 70s, placed on NRB and intermittent  Ventimask.  LINES/TUBES: PIVs  DISCUSSION: 50 year old male past medical history of systolic heart failure, class III, asthma, OSA noncompliant on CPAP, hypertension, obesity, admitted for acute on chronic heart failure with acute respiratory failure/hypoxia  ASSESSMENT / PLAN:  PULMONARY A: Acute hypoxic respiratory failure OSA on CPAP-noncompliant OHS P:   -Mandatory nocturnal BiPAP -Supplemental O2 prn to keep SPO2>88% -Nebulized bronchodilators -Patient educated on adherence  CARDIOVASCULAR A:  Acute on chronic systolic heart failure-proBNP 1546 Uncontrolled hypertension P:  -IV diuresis -Fluid restriction -Cardiology following -Continue prn hydralazine, coreg and lisinopril -Social service consult for medication assistance  RENAL A:   AKI-Mild P:   -Nephrology following -Avoid nephrotoxic drugs -Monitor and correct lectrolytes  GASTROINTESTINAL A:   No acute issues P:   -Keep NPO when on BiPAP  ENDOCRINE A:   Type 2 DM Obesity P:   -Blood glucose monitoring with SSI coverage  Disposition and family update: No family at bedside. Patient updated on current treatment plan. Need for medication and CPAP adherence reviewed at length with patient. He agrees to wear his BiPAP as recommended  Best Practice: Code Status:  Full. Diet: Heart Healthy / Carb Mod. GI prophylaxis:  Not indicated VTE prophylaxis:  SCD's /Lovenox  [facility] ANP-BC Pulmonary and [facility] Pager [phone] or [phone]    09/02/2015, 6:58 AM  STAFF NOTE: I, Dr. Vilinda Boehringer have personally reviewed patient's available data, including medical history, events of note, physical examination and test results as part of my evaluation. I have discussed with NP Demaris Callander  and other care providers such as pharmacist, RN and RRT.    S: tolerated Bipap lastnight, states that breathing is a little better this AM, still with  edema of LE. Noted to have elevated BP>140 sbp per chart during the night.   PE: dec basilar sounds, fine basilar crackles B/L  50 year old male past medical history of systolic heart failure, class III, asthma, OSA noncompliant on CPAP, hypertension, obesity, admitted for acute on chronic heart failure with acute respiratory failure/hypoxia  Acute on chronic heart failure-systolic Acute hypoxic respiratory failure OSA on CPAP-noncompliant Hypertension-uncontrolled Obesity Diabetes Mild renal insufficiency   Plan: - admission BNP is elevated at 1500, suspect uncontrolled hypertension combined with untreated OSA is exacerbating his heart failure. Noted with elevated BP overnight, coreg restarted yesterday. BP control is paramount to this CHF management.  -Continue with diuresis, noninvasive positive pressure ventilation as tolerated (BiPAP, target tidal volumes~500cc) at night, is on CPAP at home, and can switch to cpap tonight or tomorrow night.  -Cardiac diet -Fluid restriction, trend troponins -Patient with long-standing heart failure along with hypertension, may need further ischemic, cardiomegaly workup by cardiology -Mild renal insufficiency, in the setting of acute on chronic CHF exacerbation and uncontrolled hypertension. Given long-standing hypertension, and mild renal insufficiency, may consider a nephrology consult -Diabetes-continue with  SSI -Advised a prolonged discussion with the patient about OSA and CPAP compliance, states that he's had financial issues along with insurance issues limiting his ability to afford CPAP supplies, he has a machine at home, but has not used it since about 2012. Bipap QHS for now -Patient states he is a never smoker, was diagnosed with asthma about 3 years ago. Would not recommend schedule steroids in the setting of acute congestive heart failure. Continue with when necessary broncho-dilators. He may have wheezing from interstitial edema and asthma  exacerbation and when his CHF acts up, this is not a COPD exacerbation. -Maintain blood pressure of systolic less than [id number], when necessary hydralazine as needed for blood pressure greater than 140   .  Rest per NP/medical resident whose note is outlined above and that I agree with  Pulmonary Care Time devoted to patient care services described in this note is  35 Minutes.   This time reflects time of care of this signee Dr Vilinda Boehringer.  This pulmoanry care time does not reflect procedure time, or teaching time or supervisory time of PA/NP/Med-student/Med Resident etc but could involve care discussion time.  Vilinda Boehringer, MD [facility] Pager [phone] (please enter 7-digits) On Call Pager [phone] (please enter 7-digits)  Note: This note was prepared with Dragon dictation along with smaller phrase technology. Any transcriptional errors that result from this process are unintentional.

## 2015-09-02 NOTE — Progress Notes (Signed)
Have had a pretty extensive conversation for the last two nights with Bradley Mckenzie about his home cpap.[name] He has stated both times that it was not that he didn't want to use what he had at home. It was that he needed new supplies for the machine. He states he was never able to get the company servicing him to send him any. As a result he packed up unit and it has set in storage every since. He states he had not had it long before he stopped using it. Now that the issue has been readdressed, the only option presented to him is that he would need to purchase all new equipment which is something he can not do. His home unit is now here. I explained to him that we are not able to provide him with supplies or make any changes to his unit because all that is addressed by a homecare agency under the prescription of a MD. I was able to see however that his unit was set on 10.  I did however go over cleaning instructions for mask circuit and filter. He was pretty up beat about cleaning what he had and using until he could do something different. . Will continue to but him on our unit while here.  He used or bipap for appx 6 hours continuous before coming off around 530am. He tolerated well.

## 2015-09-02 NOTE — Progress Notes (Signed)
[place] [facility] [place] NAME: Bradley Mckenzie    MR#:  [id number]  DATE OF BIRTH:  1965-09-22  SUBJECTIVE:  Came in with increasing sob with weight. Dietary noncompliance. Unable to afford meds On lasix gtt uop 4500 REVIEW OF SYSTEMS:   Review of Systems  Constitutional: Negative for chills, fever and weight loss.  HENT: Negative for ear discharge, ear pain and nosebleeds.   Eyes: Negative for blurred vision, pain and discharge.  Respiratory: Positive for shortness of breath. Negative for sputum production, wheezing and stridor.   Cardiovascular: Positive for leg swelling and PND. Negative for chest pain, palpitations and orthopnea.  Gastrointestinal: Negative for abdominal pain, diarrhea, nausea and vomiting.  Genitourinary: Negative for frequency and urgency.  Musculoskeletal: Negative for back pain and joint pain.  Neurological: Positive for weakness. Negative for sensory change, speech change and focal weakness.  Psychiatric/Behavioral: Negative for depression and hallucinations. The patient is not nervous/anxious.    Tolerating Diet: Tolerating PT:   DRUG ALLERGIES:  No Known Allergies  VITALS:  Blood pressure (!) 158/92, pulse 89, temperature 98.1 F (36.7 C), temperature source Oral, resp. rate (!) 24, height 5\' 9"  (1.753 m), weight (!) 304 lb 3.8 oz (138 kg), SpO2 95 %.  PHYSICAL EXAMINATION:   Physical Exam  GENERAL:  50 y.o.-year-old patient lying in the bed with no acute distress. obese EYES: Pupils equal, round, reactive to light and accommodation. No scleral icterus. Extraocular muscles intact.  HEENT: Head atraumatic, normocephalic. Oropharynx and nasopharynx clear.  NECK:  Supple, no jugular venous distention. No thyroid enlargement, no tenderness.  LUNGS: decreased breath sounds bilaterally, no wheezing, rales, rhonchi. No use of accessory muscles of respiration.  CARDIOVASCULAR: S1, S2 normal. No murmurs, rubs,  or gallops.  ABDOMEN: Soft, nontender, nondistended. Bowel sounds present. No organomegaly or mass.  EXTREMITIES: No cyanosis, clubbing +++edema b/l.    NEUROLOGIC: Cranial nerves II through XII are intact. No focal Motor or sensory deficits b/l.   PSYCHIATRIC:  patient is alert and oriented x 3.  SKIN: No obvious rash, lesion, or ulcer.   LABORATORY PANEL:  CBC  Recent Labs Lab 09/02/15 0449  WBC 10.4  HGB 13.8  HCT 41.9  PLT 247    Chemistries   Recent Labs Lab 09/02/15 0449  NA 141  K 3.9  CL 102  CO2 32  GLUCOSE 179*  BUN 22*  CREATININE 1.27*  CALCIUM 9.1  MG 1.9   Cardiac Enzymes  Recent Labs Lab 09/01/15 1122  TROPONINI 0.04*   RADIOLOGY:  US Renal  Result Date: 09/02/2015 CLINICAL DATA:  Chronic kidney disease stage 2. EXAM: RENAL / URINARY TRACT ULTRASOUND COMPLETE COMPARISON:  None. FINDINGS: Right Kidney: Length: 11.8 cm. Echogenicity within normal limits. No mass or hydronephrosis visualized. Left Kidney: Length: 13.4 cm. Echogenicity within normal limits. No mass or hydronephrosis visualized. Bladder: Appears normal for degree of bladder distention. IMPRESSION: Normal renal ultrasound. Electronically Signed   By: Lajean Manes M.D.   On: 09/02/2015 10:43   Dg Chest Portable 1 View  Result Date: 09/01/2015 CLINICAL DATA:  Shortness of breath for several days EXAM: PORTABLE CHEST 1 VIEW COMPARISON:  July 09, 2015 FINDINGS: There is no edema or consolidation. There is generalized cardiomegaly. Pulmonary vascularity is normal. No adenopathy. No bone lesions. IMPRESSION: Cardiomegaly.  No edema or consolidation. Electronically Signed   By: Lowella Grip III M.D.   On: 09/01/2015 11:18   ASSESSMENT AND PLAN:  [name]  is coming to the ED with worsening of shortness of breath and weight gain of 12 pounds in the past one week, no improvement with the doubling of diuretics by his cardiologist   #Acute hypoxic respiratory failure secondary to acute on  chronic systoilc CHF exacerbation  -Patient seems to be noncompliant with his medications  And diet -on IV lasix gtt  #Acute exacerbation of congestive heart failure-both systolic and diastolic Monitor patient on telemetry Lasix IV gtt Daily weights and intake and output monitoring Cardiology consult  Appreciated Patient is refusing BiPAP  #Obstructive sleep apnea Patient is noncompliant with CPAP. Have asked him to have family bring in the machine for RT to check it out  #Diabetes mellitus sliding scale insulin and diabetic diet  #DVT prophylaxis with Lovenox subcutaneous  Transfer to 2A Case discussed with Care Management/Social Worker. Management plans discussed with the patient, family and they are in agreement.  CODE STATUS: full DVT Prophylaxis: lovenox  TOTAL critical TIME TAKING CARE OF THIS PATIENT: 31minutes.  >50% time spent on counselling and coordination of care  POSSIBLE D/C IN 2-3 DAYS, DEPENDING ON CLINICAL CONDITION.  Note: This dictation was prepared with Dragon dictation along with smaller phrase technology. Any transcriptional errors that result from this process are unintentional.  Saniyyah Elster M.D on 09/02/2015 at 3:59 PM  Between 7am to 6pm - Pager - [phone]  After 6pm go to [email or web address] - password [email or web address] [facility]  Office  [phone]  CC: Primary care physician; Vilinda Boehringer, MD

## 2015-09-02 NOTE — Clinical Social Work Note (Signed)
CSW received consult for medication assistance. CSW informed RNCM of needs. CSW signing off.  Santiago Bumpers, MSW, LCSW-A [phone]

## 2015-09-03 LAB — BASIC METABOLIC PANEL
Anion gap: 8 (ref 5–15)
BUN: 29 mg/dL — AB (ref 6–20)
CHLORIDE: 104 mmol/L (ref 101–111)
CO2: 29 mmol/L (ref 22–32)
CREATININE: 1.39 mg/dL — AB (ref 0.61–1.24)
Calcium: 8.9 mg/dL (ref 8.9–10.3)
GFR calc non Af Amer: 59 mL/min — ABNORMAL LOW (ref >60)
Glucose, Bld: 128 mg/dL — ABNORMAL HIGH (ref 65–99)
Potassium: 5.3 mmol/L — ABNORMAL HIGH (ref 3.5–5.1)
Sodium: 141 mmol/L (ref 135–145)

## 2015-09-03 LAB — GLUCOSE, CAPILLARY
Glucose-Capillary: 161 mg/dL — ABNORMAL HIGH (ref 65–99)
Glucose-Capillary: 150 mg/dL — ABNORMAL HIGH (ref 65–99)
Glucose-Capillary: 116 mg/dL — ABNORMAL HIGH (ref 65–99)

## 2015-09-03 MED ORDER — HYDRALAZINE HCL 25 MG PO TABS
25.0000 mg | ORAL_TABLET | Freq: Two times a day (BID) | ORAL | Status: DC
  Administered 2015-09-03 – 2015-09-05 (×5): 25 mg via ORAL
  Filled 2015-09-03 (×5): qty 1

## 2015-09-03 MED ORDER — SODIUM POLYSTYRENE SULFONATE 15 GM/60ML PO SUSP
30.0000 g | Freq: Once | ORAL | Status: AC
  Administered 2015-09-03: 30 g via ORAL
  Filled 2015-09-03: qty 120

## 2015-09-03 MED ORDER — GLIPIZIDE 5 MG PO TABS
5.0000 mg | ORAL_TABLET | Freq: Every day | ORAL | Status: DC
  Administered 2015-09-04 – 2015-09-05 (×2): 5 mg via ORAL
  Filled 2015-09-03 (×2): qty 1

## 2015-09-03 NOTE — Progress Notes (Signed)
Patient ID: Bradley Mckenzie., male   DOB: 03/22/1965, 50 y.o.   MRN: [id number]    Subjective:  Still some dyspnea some improvement   Objective:  Vitals:   09/03/15 0526 09/03/15 0600 09/03/15 0757 09/03/15 1152  BP: (!) 163/90 (!) 155/89  (!) 155/75  Pulse: 82   79  Resp: 16   16  Temp: 97.9 F (36.6 C)   98.6 F (37 C)  TempSrc: Oral   Oral  SpO2: 100%  96% 92%  Weight: 295 lb 1.6 oz (133.9 kg)     Height:        Intake/Output from previous day:  Intake/Output Summary (Last 24 hours) at 09/03/15 1155 Last data filed at 09/03/15 [id number]  Gross per 24 hour  Intake           578.34 ml  Output             3300 ml  Net         -2721.66 ml    Physical Exam: Affect appropriate Obese black male  HEENT: normal Neck supple with no adenopathy JVP normal no bruits no thyromegaly Lungs clear with no wheezing and good diaphragmatic motion Heart:  S1/S2 no murmur, no rub, gallop or click PMI normal Abdomen: benighn, BS positve, no tenderness, no AAA no bruit.  No HSM or HJR Distal pulses intact with no bruits Plus 2  edema Neuro non-focal Skin warm and dry No muscular weakness   Lab Results: Basic Metabolic Panel:  Recent Labs  09/02/15 0449 09/03/15 0436  NA 141 141  K 3.9 5.3*  CL 102 104  CO2 32 29  GLUCOSE 179* 128*  BUN 22* 29*  CREATININE 1.27* 1.39*  CALCIUM 9.1 8.9  MG 1.9  --   PHOS 6.0*  --    CBC:  Recent Labs  09/01/15 1122 09/02/15 0449  WBC 11.2* 10.4  NEUTROABS 9.0*  --   HGB 12.2* 13.8  HCT 36.9* 41.9  MCV 83.4 83.7  PLT 235 247   Cardiac Enzymes:  Recent Labs  09/01/15 1122  TROPONINI 0.04*    Imaging: US Renal  Result Date: 09/02/2015 CLINICAL DATA:  Chronic kidney disease stage 2. EXAM: RENAL / URINARY TRACT ULTRASOUND COMPLETE COMPARISON:  None. FINDINGS: Right Kidney: Length: 11.8 cm. Echogenicity within normal limits. No mass or hydronephrosis visualized. Left Kidney: Length: 13.4 cm. Echogenicity within normal limits.  No mass or hydronephrosis visualized. Bladder: Appears normal for degree of bladder distention. IMPRESSION: Normal renal ultrasound. Electronically Signed   By: Lajean Manes M.D.   On: 09/02/2015 10:43    Cardiac Studies:  ECG:  SR rate 88 chronic lateral T wave changes    Telemetry:  NSR 09/03/2015   Echo: EF 30-35% diffuse hypokinesis   Medications:   . antiseptic oral rinse  7 mL Mouth Rinse BID  . aspirin EC  81 mg Oral Daily  . carvedilol  12.5 mg Oral BID WC  . docusate sodium  100 mg Oral BID  . enoxaparin (LOVENOX) injection  40 mg Subcutaneous Q12H  . hydrALAZINE  25 mg Oral BID  . insulin aspart  0-15 Units Subcutaneous TID WC  . ipratropium-albuterol  3 mL Nebulization Q6H  . lisinopril  20 mg Oral Daily  . simvastatin  40 mg Oral Daily  . sodium chloride flush  3 mL Intravenous Q12H  . sodium chloride flush  3 mL Intravenous Q12H  . sodium polystyrene  30 g Oral Once     .  furosemide (LASIX) infusion 5 mg/hr (09/02/15 1300)    Assessment/Plan:  CHF:  Issues with compliance likely nonischemic.  Continue lasix drip and follow BUN/Cr.   HTN: Hydralazine added for better control OSA:  Has not been wearing CPAP needs new equipment CRF:  Seen by nephrology will likely need to change to oral lasix when Cr goes to 1.5-1.7   Given clinical history and social situation not clear that right and left heart cath should be Done [facility] close outpatient f/u and medical Rx   Social situation and poor diet limit his ability to care for self   Jenkins Rouge 09/03/2015, 11:55 AM

## 2015-09-03 NOTE — Progress Notes (Signed)
Had a conversation with [name] regarding his home cpap  machine. Patient stated to this RT that he does not want use the home cpap machine as the tubing and mask are 49 years old. He also stated that he may get infection if he use those supplies.He also stated that he called the home care company to get new supplies, but they never delivered it.

## 2015-09-03 NOTE — Progress Notes (Signed)
[facility]  ROUNDING NOTE   Subjective:  Patient seen in initial consultation yesterday. Patient reports feeling a bit better however he still has shortness of breath. Urine output yesterday was 3 L.    Objective:  Vital signs in last 24 hours:  Temp:  [97.6 F (36.4 C)-98.4 F (36.9 C)] 97.9 F (36.6 C) (08/20 0526) Pulse Rate:  [79-89] 82 (08/20 0526) Resp:  [16-25] 16 (08/20 0526) BP: (142-169)/(88-94) 155/89 (08/20 0600) SpO2:  [90 %-100 %] 96 % (08/20 0757) Weight:  [133.5 kg (294 lb 5 oz)-133.9 kg (295 lb 4.8 oz)] 133.9 kg (295 lb 1.6 oz) (08/20 0526)  Weight change: -5.754 kg (-12 lb 11 oz) Filed Weights   09/02/15 1400 09/02/15 1639 09/03/15 0526  Weight: 133.5 kg (294 lb 5 oz) 133.9 kg (295 lb 4.8 oz) 133.9 kg (295 lb 1.6 oz)    Intake/Output: I/O last 3 completed shifts: In: 578.3 [P.O.:480; I.V.:98.3] Out: 6400 [Urine:6400]   Intake/Output this shift:  Total I/O In: 240 [P.O.:240] Out: 1000 [Urine:1000]  Physical Exam: General: No acute distress  Head: Normocephalic, atraumatic. Moist oral mucosal membranes  Eyes: Anicteric  Neck: Supple, trachea midline  Lungs:  Basilar rales, normal effort  Heart: S1S2 no rubs  Abdomen:  Soft, nontender, Bowel sounds present   Extremities: 2+ peripheral edema.  Neurologic: Nonfocal, moving all four extremities  Skin: No lesions       Basic Metabolic Panel:  Recent Labs Lab 09/01/15 1122 09/02/15 0449 09/03/15 0436  NA 141 141 141  K 3.7 3.9 5.3*  CL 108 102 104  CO2 24 32 29  GLUCOSE 140* 179* 128*  BUN 24* 22* 29*  CREATININE 1.32* 1.27* 1.39*  CALCIUM 9.2 9.1 8.9  MG  --  1.9  --   PHOS  --  6.0*  --     Liver Function Tests: No results for input(s): AST, ALT, ALKPHOS, BILITOT, PROT, ALBUMIN in the last 168 hours. No results for input(s): LIPASE, AMYLASE in the last 168 hours. No results for input(s): AMMONIA in the last 168 hours.  CBC:  Recent Labs Lab 09/01/15 1122  09/02/15 0449  WBC 11.2* 10.4  NEUTROABS 9.0*  --   HGB 12.2* 13.8  HCT 36.9* 41.9  MCV 83.4 83.7  PLT 235 247    Cardiac Enzymes:  Recent Labs Lab 09/01/15 1122  TROPONINI 0.04*    BNP: Invalid input(s): POCBNP  CBG:  Recent Labs Lab 09/02/15 0747 09/02/15 1127 09/02/15 1643 09/03/15 0740  GLUCAP 244* 237* 117* 161*    Microbiology: Results for orders placed or performed during the hospital encounter of 09/01/15  MRSA PCR Screening     Status: None   Collection Time: 09/01/15  4:31 PM  Result Value Ref Range Status   MRSA by PCR NEGATIVE NEGATIVE Final    Comment:        The GeneXpert MRSA Assay (FDA approved for NASAL specimens only), is one component of a comprehensive MRSA colonization surveillance program. It is not intended to diagnose MRSA infection nor to guide or monitor treatment for MRSA infections.     Coagulation Studies: No results for input(s): LABPROT, INR in the last 72 hours.  Urinalysis:  Recent Labs  09/01/15 1122  COLORURINE YELLOW*  LABSPEC 1.014  PHURINE 6.0  GLUCOSEU NEGATIVE  HGBUR NEGATIVE  BILIRUBINUR NEGATIVE  KETONESUR NEGATIVE  PROTEINUR 100*  NITRITE NEGATIVE  LEUKOCYTESUR NEGATIVE      Imaging: US Renal  Result Date: 09/02/2015 CLINICAL  DATA:  Chronic kidney disease stage 2. EXAM: RENAL / URINARY TRACT ULTRASOUND COMPLETE COMPARISON:  None. FINDINGS: Right Kidney: Length: 11.8 cm. Echogenicity within normal limits. No mass or hydronephrosis visualized. Left Kidney: Length: 13.4 cm. Echogenicity within normal limits. No mass or hydronephrosis visualized. Bladder: Appears normal for degree of bladder distention. IMPRESSION: Normal renal ultrasound. Electronically Signed   By: Lajean Manes M.D.   On: 09/02/2015 10:43     Medications:   . furosemide (LASIX) infusion 5 mg/hr (09/02/15 1300)   . antiseptic oral rinse  7 mL Mouth Rinse BID  . aspirin EC  81 mg Oral Daily  . carvedilol  12.5 mg Oral BID WC   . docusate sodium  100 mg Oral BID  . enoxaparin (LOVENOX) injection  40 mg Subcutaneous Q12H  . insulin aspart  0-15 Units Subcutaneous TID WC  . ipratropium-albuterol  3 mL Nebulization Q6H  . lisinopril  20 mg Oral Daily  . simvastatin  40 mg Oral Daily  . sodium chloride flush  3 mL Intravenous Q12H  . sodium chloride flush  3 mL Intravenous Q12H   sodium chloride, acetaminophen **OR** acetaminophen, albuterol, hydrALAZINE, nitroGLYCERIN, ondansetron **OR** ondansetron (ZOFRAN) IV, sodium chloride flush  Assessment/ Plan:  50 y.o. male with a PMHx of Congestive heart failure, ejection fraction 40-45%, COPD, diabetes mellitus type 2, hyperlipidemia, hypertension, obesity, obstructive sleep apnea, not compliant with CPAP who was admitted to [facility] on 09/01/2015 for evaluation of shortness of breath, orthopnea, dyspnea with minimal exertion.   1. Chronic kidney disease stage II/proteinuria. Creatinine slightly higher today at 1.39. This is likely under the influence of Lasix as BUN has risen slightly. However he is still in need of significant diuresis. Therefore we will continue the patient on Lasix drip for now.  Renal ultrasound was unremarkable. Patient will certainly need follow-up for his underlying chronic kidney disease as an outpatient.  2. Hypertension.   blood pressure continues to be labile. Blood pressure ranged between 138/72-180/95 yesterday.  Continue carvedilol and lisinopril for now. We will add hydralazine 25 mg by mouth twice a day as well.  3. Acute systolic heart failure.  Continue active diuresis with Lasix drip at 5 mg per hour.   LOS: 2 Marigrace Mccole 8/20/201711:12 AM

## 2015-09-03 NOTE — Progress Notes (Signed)
[place] at [place] NAME: Bradley Mckenzie    MR#:  [id number]  DATE OF BIRTH:  1965-11-22  SUBJECTIVE:  Came in with increasing sob with weight. Dietary noncompliance. Unable to afford meds On lasix gtt uop 3 liters Weight going down REVIEW OF SYSTEMS:   Review of Systems  Constitutional: Negative for chills, fever and weight loss.  HENT: Negative for ear discharge, ear pain and nosebleeds.   Eyes: Negative for blurred vision, pain and discharge.  Respiratory: Positive for shortness of breath. Negative for sputum production, wheezing and stridor.   Cardiovascular: Positive for leg swelling and PND. Negative for chest pain, palpitations and orthopnea.  Gastrointestinal: Negative for abdominal pain, diarrhea, nausea and vomiting.  Genitourinary: Negative for frequency and urgency.  Musculoskeletal: Negative for back pain and joint pain.  Neurological: Positive for weakness. Negative for sensory change, speech change and focal weakness.  Psychiatric/Behavioral: Negative for depression and hallucinations. The patient is not nervous/anxious.    Tolerating Diet:yes Tolerating PT: ambulatory  DRUG ALLERGIES:  No Known Allergies  VITALS:  Blood pressure (!) 155/75, pulse 79, temperature 98.6 F (37 C), temperature source Oral, resp. rate 16, height 5\' 9"  (1.753 m), weight 295 lb 1.6 oz (133.9 kg), SpO2 94 %.  PHYSICAL EXAMINATION:   Physical Exam  GENERAL:  50 y.o.-year-old patient lying in the bed with no acute distress. obese EYES: Pupils equal, round, reactive to light and accommodation. No scleral icterus. Extraocular muscles intact.  HEENT: Head atraumatic, normocephalic. Oropharynx and nasopharynx clear.  NECK:  Supple, no jugular venous distention. No thyroid enlargement, no tenderness.  LUNGS: decreased breath sounds bilaterally, no wheezing, rales, rhonchi. No use of accessory muscles of respiration.  CARDIOVASCULAR: S1,  S2 normal. No murmurs, rubs, or gallops.  ABDOMEN: Soft, nontender, nondistended. Bowel sounds present. No organomegaly or mass.  EXTREMITIES: No cyanosis, clubbing +++edema b/l.    NEUROLOGIC: Cranial nerves II through XII are intact. No focal Motor or sensory deficits b/l.   PSYCHIATRIC:  patient is alert and oriented x 3.  SKIN: No obvious rash, lesion, or ulcer.   LABORATORY PANEL:  CBC  Recent Labs Lab 09/02/15 0449  WBC 10.4  HGB 13.8  HCT 41.9  PLT 247    Chemistries   Recent Labs Lab 09/02/15 0449 09/03/15 0436  NA 141 141  K 3.9 5.3*  CL 102 104  CO2 32 29  GLUCOSE 179* 128*  BUN 22* 29*  CREATININE 1.27* 1.39*  CALCIUM 9.1 8.9  MG 1.9  --    Cardiac Enzymes  Recent Labs Lab 09/01/15 1122  TROPONINI 0.04*   RADIOLOGY:  US Renal  Result Date: 09/02/2015 CLINICAL DATA:  Chronic kidney disease stage 2. EXAM: RENAL / URINARY TRACT ULTRASOUND COMPLETE COMPARISON:  None. FINDINGS: Right Kidney: Length: 11.8 cm. Echogenicity within normal limits. No mass or hydronephrosis visualized. Left Kidney: Length: 13.4 cm. Echogenicity within normal limits. No mass or hydronephrosis visualized. Bladder: Appears normal for degree of bladder distention. IMPRESSION: Normal renal ultrasound. Electronically Signed   By: Lajean Manes M.D.   On: 09/02/2015 10:43   ASSESSMENT AND PLAN:  [name] is coming to the ED with worsening of shortness of breath and weight gain of 12 pounds in the past one week, no improvement with the doubling of diuretics by his cardiologist   #Acute hypoxic respiratory failure secondary to acute on chronic systoilc CHF exacerbation  -Patient seems to be noncompliant with his medications  And  diet -on IV lasix gtt  #Acute exacerbation of congestive heart failure-both systolic and diastolic Monitor patient on telemetry Lasix IV gtt UOP (-) 7 liers Weight 307---304---295 libs Cardiology consult  Appreciated Patient is refusing  BiPAP  #Obstructive sleep apnea Patient is noncompliant with CPAP. Have asked him to have family bring in the machine for RT to check it out  #Diabetes mellitus sliding scale insulin and diabetic diet -A1c was 7.8 in march 2017 -will start po glipizide for DM-2  #DVT prophylaxis with Lovenox subcutaneous   Case discussed with Care Management/Social Worker. Management plans discussed with the patient, family and they are in agreement.  CODE STATUS: full DVT Prophylaxis: lovenox  TOTAL critical TIME TAKING CARE OF THIS PATIENT: 9minutes.  >50% time spent on counselling and coordination of care  POSSIBLE D/C IN 2-3 DAYS, DEPENDING ON CLINICAL CONDITION.  Note: This dictation was prepared with Dragon dictation along with smaller phrase technology. Any transcriptional errors that result from this process are unintentional.  Cordell Guercio M.D on 09/03/2015 at 1:34 PM  Between 7am to 6pm - Pager - [phone]  After 6pm go to [email or web address] - password [email or web address] [facility]  Office  [phone]  CC: Primary care physician; Vilinda Boehringer, MD

## 2015-09-04 LAB — GLUCOSE, CAPILLARY
GLUCOSE-CAPILLARY: 111 mg/dL — AB (ref 65–99)
GLUCOSE-CAPILLARY: 102 mg/dL — AB (ref 65–99)
Glucose-Capillary: 103 mg/dL — ABNORMAL HIGH (ref 65–99)
Glucose-Capillary: 178 mg/dL — ABNORMAL HIGH (ref 65–99)

## 2015-09-04 MED ORDER — LISINOPRIL 20 MG PO TABS
40.0000 mg | ORAL_TABLET | Freq: Every day | ORAL | Status: DC
  Administered 2015-09-05: 40 mg via ORAL
  Filled 2015-09-04: qty 2

## 2015-09-04 MED ORDER — IPRATROPIUM-ALBUTEROL 0.5-2.5 (3) MG/3ML IN SOLN
3.0000 mL | RESPIRATORY_TRACT | Status: DC | PRN
Start: 2015-09-04 — End: 2015-09-05

## 2015-09-04 MED ORDER — LISINOPRIL 20 MG PO TABS: 20.0000 mg | ORAL_TABLET | Freq: Once | ORAL | Status: DC

## 2015-09-04 NOTE — Progress Notes (Signed)
[place] at [place] NAME: Bradley Mckenzie    MR#:  [id number]  DATE OF BIRTH:  [birth year]  SUBJECTIVE:  Came in with increasing sob with weight. Dietary noncompliance. Unable to afford meds On lasix gtt uop 10 liters since admssion Weight going down REVIEW OF SYSTEMS:   Review of Systems  Constitutional: Negative for chills, fever and weight loss.  HENT: Negative for ear discharge, ear pain and nosebleeds.   Eyes: Negative for blurred vision, pain and discharge.  Respiratory: Positive for shortness of breath. Negative for sputum production, wheezing and stridor.   Cardiovascular: Positive for leg swelling and PND. Negative for chest pain, palpitations and orthopnea.  Gastrointestinal: Negative for abdominal pain, diarrhea, nausea and vomiting.  Genitourinary: Negative for frequency and urgency.  Musculoskeletal: Negative for back pain and joint pain.  Neurological: Positive for weakness. Negative for sensory change, speech change and focal weakness.  Psychiatric/Behavioral: Negative for depression and hallucinations. The patient is not nervous/anxious.    Tolerating Diet:yes Tolerating PT: ambulatory  DRUG ALLERGIES:  No Known Allergies  VITALS:  Blood pressure (!) 145/74, pulse 88, temperature 97.5 F (36.4 C), temperature source Oral, resp. rate 18, height 5\' 9"  (1.753 m), weight 290 lb 9.6 oz (131.8 kg), SpO2 94 %.  PHYSICAL EXAMINATION:   Physical Exam  GENERAL:  50 y.o.-year-old patient lying in the bed with no acute distress. obese EYES: Pupils equal, round, reactive to light and accommodation. No scleral icterus. Extraocular muscles intact.  HEENT: Head atraumatic, normocephalic. Oropharynx and nasopharynx clear.  NECK:  Supple, no jugular venous distention. No thyroid enlargement, no tenderness.  LUNGS: decreased breath sounds bilaterally, no wheezing, rales, rhonchi. No use of accessory muscles of respiration.   CARDIOVASCULAR: S1, S2 normal. No murmurs, rubs, or gallops.  ABDOMEN: Soft, nontender, nondistended. Bowel sounds present. No organomegaly or mass.  EXTREMITIES: No cyanosis, clubbing +++edema b/l.    NEUROLOGIC: Cranial nerves II through XII are intact. No focal Motor or sensory deficits b/l.   PSYCHIATRIC:  patient is alert and oriented x 3.  SKIN: No obvious rash, lesion, or ulcer.   LABORATORY PANEL:  CBC  Recent Labs Lab 09/02/15 0449  WBC 10.4  HGB 13.8  HCT 41.9  PLT 247    Chemistries   Recent Labs Lab 09/02/15 0449 09/03/15 0436  NA 141 141  K 3.9 5.3*  CL 102 104  CO2 32 29  GLUCOSE 179* 128*  BUN 22* 29*  CREATININE 1.27* 1.39*  CALCIUM 9.1 8.9  MG 1.9  --    Cardiac Enzymes  Recent Labs Lab 09/01/15 1122  TROPONINI 0.04*   RADIOLOGY:  No results found. ASSESSMENT AND PLAN:  [name] is coming to the ED with worsening of shortness of breath and weight gain of 12 pounds in the past one week, no improvement with the doubling of diuretics by his cardiologist   #Acute hypoxic respiratory failure secondary to acute on chronic systoilc CHF exacerbation  -Patient seems to be noncompliant with his medications  And diet -on IV lasix gtt  #Acute exacerbation of congestive heart failure-both systolic and diastolic Monitor patient on telemetry Lasix IV gtt for 1 more day UOP (-) 10 liers Weight 307---304---295 lbs Cardiology consult  Appreciated Patient is refusing BiPAP  #Obstructive sleep apnea Patient is noncompliant with CPAP. Have asked him to have family bring in the machine for RT to check it out  #Diabetes mellitus sliding scale insulin and diabetic diet -A1c  was 7.8 in march 2017 -will start po glipizide for DM-2  #DVT prophylaxis with Lovenox subcutaneous  If remains stable d/c in am.  Case discussed with Care Management/Social Worker. Management plans discussed with the patient, family and they are in agreement.  CODE  STATUS: full DVT Prophylaxis: lovenox  TOTAL critical TIME TAKING CARE OF THIS PATIENT: 34minutes.  >50% time spent on counselling and coordination of care  POSSIBLE D/C IN 2-3 DAYS, DEPENDING ON CLINICAL CONDITION.  Note: This dictation was prepared with Dragon dictation along with smaller phrase technology. Any transcriptional errors that result from this process are unintentional.  Kalib Bhagat M.D on 09/04/2015 at 11:14 AM  Between 7am to 6pm - Pager - [phone]  After 6pm go to [email or web address] - password [email or web address] [facility]  Office  [phone]  CC: Primary care physician; Vilinda Boehringer, MD

## 2015-09-04 NOTE — Progress Notes (Signed)
SUBJECTIVE:  He continues to be on furosemide drip. Good response. He reports improvement in shortness of breath and dyspnea but still not back to baseline.   Vitals:   09/03/15 2343 09/04/15 0214 09/04/15 0511 09/04/15 0545  BP: (!) 158/94  (!) 186/90 (!) 145/74  Pulse: 83  84 88  Resp: 18  18   Temp:   97.5 F (36.4 C)   TempSrc:   Oral   SpO2: 100% 99% 94%   Weight:    290 lb 9.6 oz (131.8 kg)  Height:        Intake/Output Summary (Last 24 hours) at 09/04/15 0850 Last data filed at 09/04/15 [id number]  Gross per 24 hour  Intake           842.75 ml  Output             3700 ml  Net         -2857.25 ml    LABS: Basic Metabolic Panel:  Recent Labs  09/02/15 0449 09/03/15 0436  NA 141 141  K 3.9 5.3*  CL 102 104  CO2 32 29  GLUCOSE 179* 128*  BUN 22* 29*  CREATININE 1.27* 1.39*  CALCIUM 9.1 8.9  MG 1.9  --   PHOS 6.0*  --    Liver Function Tests: No results for input(s): AST, ALT, ALKPHOS, BILITOT, PROT, ALBUMIN in the last 72 hours. No results for input(s): LIPASE, AMYLASE in the last 72 hours. CBC:  Recent Labs  09/01/15 1122 09/02/15 0449  WBC 11.2* 10.4  NEUTROABS 9.0*  --   HGB 12.2* 13.8  HCT 36.9* 41.9  MCV 83.4 83.7  PLT 235 247   Cardiac Enzymes:  Recent Labs  09/01/15 1122  TROPONINI 0.04*   BNP: Invalid input(s): POCBNP D-Dimer: No results for input(s): DDIMER in the last 72 hours. Hemoglobin A1C: No results for input(s): HGBA1C in the last 72 hours. Fasting Lipid Panel: No results for input(s): CHOL, HDL, LDLCALC, TRIG, CHOLHDL, LDLDIRECT in the last 72 hours. Thyroid Function Tests: No results for input(s): TSH, T4TOTAL, T3FREE, THYROIDAB in the last 72 hours.  Invalid input(s): FREET3 Anemia Panel: No results for input(s): VITAMINB12, FOLATE, FERRITIN, TIBC, IRON, RETICCTPCT in the last 72 hours.   PHYSICAL EXAM General: Well developed, well nourished, in no acute distress HEENT:  Normocephalic and atramatic Neck:  No JVD.    Lungs: Bibasilar crackles present. Heart: HRRR . Normal S1 and S2 without gallops or murmurs.  Abdomen: Bowel sounds are positive, abdomen soft and non-tender  Msk:  Back normal, normal gait. Normal strength and tone for age. Extremities: No clubbing, cyanosis . +2 edema Neuro: Alert and oriented X 3. Psych:  Good affect, responds appropriately  TELEMETRY: Reviewed telemetry pt in normal sinus rhythm  ASSESSMENT AND PLAN:  1. 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 Echocardiogram showed an ejection fraction of 30-35% with diffuse hypokinesis. Systolic pulmonary pressure was estimated to be 44 mmHg. The patient continues to be volume overloaded but he is responding well to furosemide drip. So far, he is negative almost 12 L and his weight is down from 304 lb to 290 lb. I recommend continuing furosemide drip likely for at least another day. I increased the dose of lisinopril to 40 mg once daily. Continue carvedilol and if blood pressure continues to be elevated before discharge, consider increasing the dose to 25 mg twice daily.  2. Essential hypertension: Blood pressure is  still not elevated but has improved. I increased lisinopril as outlined above.  3. Obstructive sleep apnea: Recommend treatment of this as this is likely contributing to his heart failure and uncontrolled hypertension.  Kathlyn Sacramento, MD, [facility] 09/04/2015 8:50 AM

## 2015-09-04 NOTE — Progress Notes (Signed)
[facility]  ROUNDING NOTE   Subjective:   Sitting up in chair UOP 4.1 litres K 5.3 Furosemide gtt 5mg /hr  Continues to complain of DOE  Objective:  Vital signs in last 24 hours:  Temp:  [97.5 F (36.4 C)-98.6 F (37 C)] 97.5 F (36.4 C) (08/21 0511) Pulse Rate:  [79-88] 88 (08/21 0545) Resp:  [16-18] 18 (08/21 0511) BP: (145-186)/(74-94) 145/74 (08/21 0545) SpO2:  [92 %-100 %] 94 % (08/21 0511) FiO2 (%):  [35 %-96 %] 96 % (08/21 0700) Weight:  [131.8 kg (290 lb 9.6 oz)] 131.8 kg (290 lb 9.6 oz) (08/21 0545)  Weight change: -1.685 kg (-3 lb 11.4 oz) Filed Weights   09/02/15 1639 09/03/15 0526 09/04/15 0545  Weight: 133.9 kg (295 lb 4.8 oz) 133.9 kg (295 lb 1.6 oz) 131.8 kg (290 lb 9.6 oz)    Intake/Output: I/O last 3 completed shifts: In: 898.8 [P.O.:720; I.V.:178.8] Out: 5700 [Urine:5700]   Intake/Output this shift:  Total I/O In: 360 [P.O.:360] Out: 500 [Urine:500]  Physical Exam: General: No acute distress  Head: Normocephalic, atraumatic. Moist oral mucosal membranes  Eyes: Anicteric  Neck: Supple, trachea midline  Lungs:  Basilar rales, normal effort  Heart: S1S2 no rubs  Abdomen:  Soft, nontender, Bowel sounds present   Extremities: 1+ peripheral edema.  Neurologic: Nonfocal, moving all four extremities  Skin: No lesions       Basic Metabolic Panel:  Recent Labs Lab 09/01/15 1122 09/02/15 0449 09/03/15 0436  NA 141 141 141  K 3.7 3.9 5.3*  CL 108 102 104  CO2 24 32 29  GLUCOSE 140* 179* 128*  BUN 24* 22* 29*  CREATININE 1.32* 1.27* 1.39*  CALCIUM 9.2 9.1 8.9  MG  --  1.9  --   PHOS  --  6.0*  --     Liver Function Tests: No results for input(s): AST, ALT, ALKPHOS, BILITOT, PROT, ALBUMIN in the last 168 hours. No results for input(s): LIPASE, AMYLASE in the last 168 hours. No results for input(s): AMMONIA in the last 168 hours.  CBC:  Recent Labs Lab 09/01/15 1122 09/02/15 0449  WBC 11.2* 10.4  NEUTROABS 9.0*  --    HGB 12.2* 13.8  HCT 36.9* 41.9  MCV 83.4 83.7  PLT 235 247    Cardiac Enzymes:  Recent Labs Lab 09/01/15 1122  TROPONINI 0.04*    BNP: Invalid input(s): POCBNP  CBG:  Recent Labs Lab 09/02/15 1643 09/03/15 0740 09/03/15 1147 09/03/15 1625 09/04/15 0829  GLUCAP 117* 161* 150* 116* 111*    Microbiology: Results for orders placed or performed during the hospital encounter of 09/01/15  MRSA PCR Screening     Status: None   Collection Time: 09/01/15  4:31 PM  Result Value Ref Range Status   MRSA by PCR NEGATIVE NEGATIVE Final    Comment:        The GeneXpert MRSA Assay (FDA approved for NASAL specimens only), is one component of a comprehensive MRSA colonization surveillance program. It is not intended to diagnose MRSA infection nor to guide or monitor treatment for MRSA infections.     Coagulation Studies: No results for input(s): LABPROT, INR in the last 72 hours.  Urinalysis: No results for input(s): COLORURINE, LABSPEC, PHURINE, GLUCOSEU, HGBUR, BILIRUBINUR, KETONESUR, PROTEINUR, UROBILINOGEN, NITRITE, LEUKOCYTESUR in the last 72 hours.  Invalid input(s): APPERANCEUR    Imaging: No results found.   Medications:   . furosemide (LASIX) infusion 5 mg/hr (09/02/15 1300)   . antiseptic oral  rinse  7 mL Mouth Rinse BID  . aspirin EC  81 mg Oral Daily  . carvedilol  12.5 mg Oral BID WC  . docusate sodium  100 mg Oral BID  . enoxaparin (LOVENOX) injection  40 mg Subcutaneous Q12H  . glipiZIDE  5 mg Oral QAC breakfast  . hydrALAZINE  25 mg Oral BID  . insulin aspart  0-15 Units Subcutaneous TID WC  . ipratropium-albuterol  3 mL Nebulization Q6H  . lisinopril  20 mg Oral Once  . [START ON 09/05/2015] lisinopril  40 mg Oral Daily  . simvastatin  40 mg Oral Daily  . sodium chloride flush  3 mL Intravenous Q12H   sodium chloride, acetaminophen **OR** acetaminophen, albuterol, hydrALAZINE, nitroGLYCERIN, ondansetron **OR** ondansetron (ZOFRAN) IV,  sodium chloride flush  Assessment/ Plan:  50 y.o. black male with systolic congestive heart failure, ejection fraction 40-45%, COPD, diabetes mellitus type 2, hyperlipidemia, hypertension, obesity, obstructive sleep apnea, not compliant with CPAP who was admitted to [facility] on 09/01/2015 for evaluation of shortness of breath, orthopnea, dyspnea with minimal exertion.   1. 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  - lisinopril  2. Hyperkalemia: due to potassium chloride, now discontinued.  2. Hypertension and acute systolic congestive heart failure.  better control - furosemide gtt. Continue for one more day - Consider adding spironolactone when potassium at goal - lisinopril, carvedilol   LOS: [place], [place] 8/21/201711:28 AM

## 2015-09-04 NOTE — Progress Notes (Signed)
Heart Failure Clinic appointment scheduled for September 21, 2015 at 11:00am. Thank you.

## 2015-09-05 DIAGNOSIS — I429 Cardiomyopathy, unspecified: Secondary | ICD-10-CM

## 2015-09-05 DIAGNOSIS — N1831 Chronic kidney disease, stage 3a: Secondary | ICD-10-CM

## 2015-09-05 DIAGNOSIS — E785 Hyperlipidemia, unspecified: Secondary | ICD-10-CM

## 2015-09-05 DIAGNOSIS — E119 Type 2 diabetes mellitus without complications: Secondary | ICD-10-CM

## 2015-09-05 DIAGNOSIS — I110 Hypertensive heart disease with heart failure: Secondary | ICD-10-CM

## 2015-09-05 DIAGNOSIS — N182 Chronic kidney disease, stage 2 (mild): Secondary | ICD-10-CM

## 2015-09-05 LAB — BASIC METABOLIC PANEL
Anion gap: 10 (ref 5–15)
BUN: 20 mg/dL (ref 6–20)
CHLORIDE: 100 mmol/L — AB (ref 101–111)
CO2: 29 mmol/L (ref 22–32)
CREATININE: 1.2 mg/dL (ref 0.61–1.24)
Calcium: 8.5 mg/dL — ABNORMAL LOW (ref 8.9–10.3)
GFR calc non Af Amer: >60 mL/min (ref >60)
Glucose, Bld: 124 mg/dL — ABNORMAL HIGH (ref 65–99)
POTASSIUM: 3.3 mmol/L — AB (ref 3.5–5.1)
SODIUM: 139 mmol/L (ref 135–145)

## 2015-09-05 LAB — GLUCOSE, CAPILLARY
GLUCOSE-CAPILLARY: 157 mg/dL — AB (ref 65–99)
GLUCOSE-CAPILLARY: 92 mg/dL (ref 65–99)

## 2015-09-05 MED ORDER — LOVASTATIN 40 MG PO TABS: 40.0000 mg | ORAL_TABLET | Freq: Every day | ORAL | 1 refills | Status: DC

## 2015-09-05 MED ORDER — SPIRONOLACTONE 25 MG PO TABS: 25.0000 mg | ORAL_TABLET | Freq: Every day | ORAL | 1 refills | Status: DC

## 2015-09-05 MED ORDER — FUROSEMIDE 40 MG PO TABS: 40.0000 mg | ORAL_TABLET | Freq: Two times a day (BID) | ORAL | 1 refills | Status: DC

## 2015-09-05 MED ORDER — HYDRALAZINE HCL 25 MG PO TABS: 25.0000 mg | ORAL_TABLET | Freq: Three times a day (TID) | ORAL | Status: DC

## 2015-09-05 MED ORDER — SPIRONOLACTONE 25 MG PO TABS
25.0000 mg | ORAL_TABLET | Freq: Every day | ORAL | Status: DC
  Administered 2015-09-05: 25 mg via ORAL
  Filled 2015-09-05: qty 1

## 2015-09-05 MED ORDER — GLIPIZIDE 5 MG PO TABS: 5.0000 mg | ORAL_TABLET | Freq: Every day | ORAL | 2 refills | Status: DC

## 2015-09-05 MED ORDER — HYDRALAZINE HCL 25 MG PO TABS: 25.0000 mg | ORAL_TABLET | Freq: Two times a day (BID) | ORAL | 2 refills | Status: DC

## 2015-09-05 MED ORDER — POTASSIUM CHLORIDE CRYS ER 20 MEQ PO TBCR
20.0000 meq | EXTENDED_RELEASE_TABLET | Freq: Two times a day (BID) | ORAL | Status: DC
  Administered 2015-09-05: 20 meq via ORAL
  Filled 2015-09-05: qty 1

## 2015-09-05 MED ORDER — FUROSEMIDE 10 MG/ML IJ SOLN
40.0000 mg | Freq: Once | INTRAMUSCULAR | Status: AC
  Administered 2015-09-05: 40 mg via INTRAVENOUS
  Filled 2015-09-05: qty 4

## 2015-09-05 MED ORDER — LIVING WELL WITH DIABETES BOOK
Freq: Once | Status: DC
Start: 2015-09-05 — End: 2015-09-05
  Filled 2015-09-05: qty 1

## 2015-09-05 MED ORDER — FUROSEMIDE 40 MG PO TABS
40.0000 mg | ORAL_TABLET | Freq: Two times a day (BID) | ORAL | Status: DC
  Administered 2015-09-05: 40 mg via ORAL
  Filled 2015-09-05: qty 1

## 2015-09-05 NOTE — Progress Notes (Signed)
Inpatient Diabetes Program Recommendations  AACE/ADA: New Consensus Statement on Inpatient Glycemic Control (2015)  Target Ranges:  Prepandial:   less than 140 mg/dL      Peak postprandial:   less than 180 mg/dL (1-2 hours)      Critically ill patients:  140 - 180 mg/dL   Lab Results  Component Value Date   GLUCAP 92 09/05/2015   HGBA1C 7.8 (H) 03/28/2015    Review of Glycemic Control  Diabetes history: Type 2 Outpatient Diabetes medications: none Current orders for Inpatient glycemic control: Glipizide 5mg  qam  Inpatient Diabetes Program Recommendations:  Consult for Diabetes coordinator received.  Spoke with patient by phone.  He tells me he has recently spoken to the dietitian. Reviewed the importance of eliminating sweetened beverages from his diet- he tells me he likes pomegranate juice. Tells me he will drink water and has tried flavored drink packages.    Discussed the mechanism of Glipizide and the importance of eating 3 meals and an hs snack  (that contained carb and a protein) such as peanut butter crackers or a peanut butter sandwich.   He asked about the cost of the meter and testing strips- I reviewed what I saw on line regarding prices- I have also instructed him that he will get a meter and strips [facility]. I have reminded him that he will need the [employer] meter to check his sugar before he gets to [facility] so the doctors can make medication adjustments- recommended once a day alternating between fasting and 2 hour post prandial.   If patient has further questions, please reach out to our team.  Gentry Fitz, RN, BA, MHA, CDE Diabetes Coordinator Inpatient Diabetes Program  [phone] (Team Pager) [phone] ([place]) 09/05/2015 3:14 PM

## 2015-09-05 NOTE — Care Management (Signed)
Met with patient to discuss medication management and [facility]. He was referred last admission in June but he did not follow up. "I have to much pride, I guess".  He is willing to be re [facility] but refuses any assistance from the medication management. He states he can pay for his prescriptions. Reviewed prescription list. All his medication with the exception of 2 are on the $4 list at [facility]. Good Rx coupons given for Potassium ($13-[facility]) and Albuterol inhaler ($56- [facility]). Referred patient to [facility] for a Rely On glucometer ($13). LM for the CHF clinic inquiring as to a set of scales. Patient verbalized understaning of POC and is in agreement.

## 2015-09-05 NOTE — Plan of Care (Signed)
Problem: Food- and Nutrition-Related Knowledge Deficit (NB-1.1) Goal: Nutrition education Formal process to instruct or train a patient/client in a skill or to impart knowledge to help patients/clients voluntarily manage or modify food choices and eating behavior to maintain or improve health. Outcome: Completed/Met Date Met: 09/05/15  RD consulted for nutrition education regarding diabetes.   Lab Results  Component Value Date   HGBA1C 7.8 (H) 03/28/2015    RD provided "Carbohydrate Counting for People with Diabetes" handout from the Academy of Nutrition and Dietetics. Discussed different food groups and their effects on blood sugar, emphasizing carbohydrate-containing foods. Provided list of carbohydrates and recommended serving sizes of common foods.  Discussed importance of controlled and consistent carbohydrate intake throughout the day. Provided examples of ways to balance meals/snacks and encouraged intake of high-fiber, whole grain complex carbohydrates. Teach back method used.  Expect fair compliance.  Body mass index is 42.86 kg/m. Pt meets criteria for obese class III based on current BMI.  Current diet order is low sodium, heart healthy, patient is consuming approximately 100% of meals at this time. Labs and medications reviewed. No further nutrition interventions warranted at this time. RD contact information provided. If additional nutrition issues arise, please re-consult RD.  Bradley Mckenzie. Eyonna Sandstrom, MS, RD LDN Inpatient Clinical Dietitian Pager [phone]

## 2015-09-05 NOTE — Discharge Instructions (Signed)
°  Heart Failure Clinic appointment on September 21, 2015 at 11:00am with Darylene Price, [place]. Please call [phone] to reschedule.

## 2015-09-05 NOTE — Discharge Summary (Addendum)
[place] [facility] [place] NAME: Bradley Mckenzie    MR#:  [id number]  DATE OF BIRTH:  1965-09-03  DATE OF ADMISSION:  09/01/2015 ADMITTING PHYSICIAN: Nicholes Mango, MD  DATE OF DISCHARGE: 09/04/15  PRIMARY CARE PHYSICIAN: Vilinda Boehringer, MD    ADMISSION DIAGNOSIS:  Acute respiratory failure with hypoxia ([place]) [J96.01] Acute on chronic congestive heart failure, unspecified congestive heart failure type ([place]) [I50.9]  DISCHARGE DIAGNOSIS:  Acute on chronic CHF systolic due to dietary noncompliance Dm-2 OSA HTN  SECONDARY DIAGNOSIS:   Past Medical History:  Diagnosis Date  . Cardiomyopathy ([place])    a. 01/2014 Echo: EF 25-30%;  b. ? ischemic vs non-ischemic.  He's never had an ischemic eval.  . Chronic combined systolic (congestive) and diastolic (congestive) heart failure ([place])    a. 01/2014 Echo: EF 25-30%. b. Echo 03/2015: Improved EF of 40-45%  . Depression   . Diabetes mellitus without complication ([place])    a. Not previously on outpt meds.  . Hyperlipidemia   . Hypertensive heart disease    a. Since his 72's.  . Morbid obesity ([place])   . Obstructive sleep apnea    a. Has not used CPAP since ~ 2012.    HOSPITAL COURSE:   Bradley Mckenzie is coming to the ED with worsening of shortness of breath and weight gain of 12 pounds in the past one week, no improvement with the doubling of diuretics by his cardiologist   #Acute hypoxic respiratory failure secondary to acute on chronic systoilc CHF exacerbation  -Patient seems to be noncompliant with his medications  And diet -on IV lasix gtt  #Acute exacerbation of congestive heart failure systolic EF [id number] Lasix IV gtt changed to po lasix 40 mg bid, k dur 20 meq bid and add spironolactone 25 mg qd (d/w Dr Juleen China) -cont lisinopril UOP (-) 11 liers Weight 307---304---295 lbs Cardiology consult  Appreciated Patient is refusing BiPAP  #Obstructive sleep apnea -Patient is noncompliant  with CPAP- due to issues with dirty parts. Advised to see if he can get the parts replaced  #Diabetes mellitus sliding scale insulin and diabetic diet -A1c was 7.8 in march 2017 -will start po glipizide for DM-2. Sugars much controlled  #DVT prophylaxis with Lovenox subcutaneous  #HL lovastatin Doing well d/c home  CONSULTS OBTAINED:  Treatment Team:  Minna Merritts, MD Munsoor Holley Raring, MD Josue Hector, MD  DRUG ALLERGIES:  No Known Allergies  DISCHARGE MEDICATIONS:   Current Discharge Medication List    START taking these medications   Details  furosemide (LASIX) 40 MG tablet Take 1 tablet (40 mg total) by mouth 2 (two) times daily. Qty: 60 tablet, Refills: 1    glipiZIDE (GLUCOTROL) 5 MG tablet Take 1 tablet (5 mg total) by mouth daily before breakfast. Qty: 30 tablet, Refills: 2    hydrALAZINE (APRESOLINE) 25 MG tablet Take 1 tablet (25 mg total) by mouth 2 (two) times daily. Qty: 60 tablet, Refills: 2    lovastatin (MEVACOR) 40 MG tablet Take 1 tablet (40 mg total) by mouth at bedtime. Qty: 30 tablet, Refills: 1    spironolactone (ALDACTONE) 25 MG tablet Take 1 tablet (25 mg total) by mouth daily. Qty: 30 tablet, Refills: 1      CONTINUE these medications which have NOT CHANGED   Details  albuterol (PROVENTIL HFA;VENTOLIN HFA) 108 (90 Base) MCG/ACT inhaler Inhale 2 puffs into the lungs every 6 (six) hours as needed for wheezing or shortness  of breath. Reported on 07/08/2015 Qty: 1 Inhaler, Refills: 3    aspirin EC 81 MG tablet Take 1 tablet (81 mg total) by mouth daily. Qty: 30 tablet, Refills: 3    carvedilol (COREG) 12.5 MG tablet Take 1 tablet (12.5 mg total) by mouth 2 (two) times daily with a meal. Qty: 60 tablet, Refills: 3    lisinopril (PRINIVIL,ZESTRIL) 20 MG tablet Take 1 tablet (20 mg total) by mouth daily. Qty: 30 tablet, Refills: 3    potassium chloride SA (K-DUR,KLOR-CON) 20 MEQ tablet Take 1 tablet (20 mEq total) by mouth 2 (two) times  daily. Qty: 90 tablet, Refills: 3      STOP taking these medications     simvastatin (ZOCOR) 40 MG tablet         If you experience worsening of your admission symptoms, develop shortness of breath, life threatening emergency, suicidal or homicidal thoughts you must seek medical attention immediately by calling 911 or calling your MD immediately  if symptoms less severe.  You Must read complete instructions/literature along with all the possible adverse reactions/side effects for all the Medicines you take and that have been prescribed to you. Take any new Medicines after you have completely understood and accept all the possible adverse reactions/side effects.   Please note  You were cared for by a hospitalist during your hospital stay. If you have any questions about your discharge medications or the care you received while you were in the hospital after you are discharged, you can call the unit and asked to speak with the hospitalist on call if the hospitalist that took care of you is not available. Once you are discharged, your primary care physician will handle any further medical issues. Please note that NO REFILLS for any discharge medications will be authorized once you are discharged, as it is imperative that you return to your primary care physician (or establish a relationship with a primary care physician if you do not have one) for your aftercare needs so that they can reassess your need for medications and monitor your lab values. Today   SUBJECTIVE   Doing well VITAL SIGNS:  Blood pressure (!) 145/77, pulse 78, temperature 98.3 F (36.8 C), temperature source Oral, resp. rate 18, height 5\' 9"  (1.753 m), weight 290 lb 3.2 oz (131.6 kg), SpO2 100 %.  I/O:    Intake/Output Summary (Last 24 hours) at 09/05/15 0948 Last data filed at 09/05/15 [id number]  Gross per 24 hour  Intake              360 ml  Output             1600 ml  Net            -1240 ml    PHYSICAL  EXAMINATION:  GENERAL:  50 y.o.-year-old patient lying in the bed with no acute distress.  EYES: Pupils equal, round, reactive to light and accommodation. No scleral icterus. Extraocular muscles intact.  HEENT: Head atraumatic, normocephalic. Oropharynx and nasopharynx clear.  NECK:  Supple, no jugular venous distention. No thyroid enlargement, no tenderness.  LUNGS: Normal breath sounds bilaterally, no wheezing, rales,rhonchi or crepitation. No use of accessory muscles of respiration.  CARDIOVASCULAR: S1, S2 normal. No murmurs, rubs, or gallops.  ABDOMEN: Soft, non-tender, non-distended. Bowel sounds present. No organomegaly or mass.  EXTREMITIES: No pedal edema, cyanosis, or clubbing.  NEUROLOGIC: Cranial nerves II through XII are intact. Muscle strength 5/5 in all extremities. Sensation intact. Gait not checked.  PSYCHIATRIC: The patient is alert and oriented x 3.  SKIN: No obvious rash, lesion, or ulcer.   DATA REVIEW:   CBC   Recent Labs Lab 09/02/15 0449  WBC 10.4  HGB 13.8  HCT 41.9  PLT 247    Chemistries   Recent Labs Lab 09/02/15 0449  09/05/15 0415  NA 141  < > 139  K 3.9  < > 3.3*  CL 102  < > 100*  CO2 32  < > 29  GLUCOSE 179*  < > 124*  BUN 22*  < > 20  CREATININE 1.27*  < > 1.20  CALCIUM 9.1  < > 8.5*  MG 1.9  --   --   < > = values in this interval not displayed.  Microbiology Results   Recent Results (from the past 240 hour(s))  MRSA PCR Screening     Status: None   Collection Time: 09/01/15  4:31 PM  Result Value Ref Range Status   MRSA by PCR NEGATIVE NEGATIVE Final    Comment:        The GeneXpert MRSA Assay (FDA approved for NASAL specimens only), is one component of a comprehensive MRSA colonization surveillance program. It is not intended to diagnose MRSA infection nor to guide or monitor treatment for MRSA infections.     RADIOLOGY:  No results found.   Management plans discussed with the patient, family and they are in  agreement.  CODE STATUS:     Code Status Orders        Start     Ordered   09/01/15 1627  Full code  Continuous     09/01/15 1626    Code Status History    Date Active Date Inactive Code Status Order ID Comments User Context   09/01/2015  4:26 PM 09/02/2015  6:15 AM Full Code [id number]  Nicholes Mango, MD ED   07/08/2015 10:27 AM 07/10/2015  5:59 PM Full Code [id number]  Vaughan Basta, MD Inpatient   03/27/2015  1:46 PM 03/31/2015  4:05 PM Full Code [id number]  Henreitta Leber, MD ED   08/07/2014  9:00 PM 08/08/2014  6:19 PM Full Code [id number]  Baxter Hire, MD Inpatient      TOTAL TIME TAKING CARE OF THIS PATIENT: 40 minutes.    Shaniece Bussa M.D on 09/05/2015 at 9:48 AM  Between 7am to 6pm - Pager - [phone] After 6pm go to [email or web address] - password [email or web address] [place] [facility]  Office  [phone]  CC: Primary care physician; Vilinda Boehringer, MD

## 2015-09-05 NOTE — Progress Notes (Signed)
Patient Name: Bradley Mckenzie. Date of Encounter: 09/05/2015  Hospital Problem List     Principal Problem:   Acute on chronic clinical systolic heart failure [facility]) Active Problems:   Cardiomyopathy ([place])   Acute respiratory failure ([place])   Acute respiratory failure with hypoxia ([facility])   Morbid obesity ([place])   Diabetes mellitus without complication ([place])   Hypertensive heart disease   OSA on CPAP   Obesity   Hyperlipidemia   Subjective   Breathing improved.  Still with edema.  Eager to go home.  Inpatient Medications    . antiseptic oral rinse  7 mL Mouth Rinse BID  . aspirin EC  81 mg Oral Daily  . carvedilol  12.5 mg Oral BID WC  . docusate sodium  100 mg Oral BID  . enoxaparin (LOVENOX) injection  40 mg Subcutaneous Q12H  . furosemide  40 mg Oral BID  . glipiZIDE  5 mg Oral QAC breakfast  . hydrALAZINE  25 mg Oral BID  . insulin aspart  0-15 Units Subcutaneous TID WC  . lisinopril  20 mg Oral Once  . lisinopril  40 mg Oral Daily  . potassium chloride  20 mEq Oral BID  . simvastatin  40 mg Oral Daily  . sodium chloride flush  3 mL Intravenous Q12H  . spironolactone  25 mg Oral Daily    Vital Signs    Vitals:   09/04/15 1327 09/04/15 1943 09/04/15 2106 09/05/15 0459  BP:  (!) 167/77  (!) 145/77  Pulse:  86  78  Resp:  18  18  Temp:  97.8 F (36.6 C)  98.3 F (36.8 C)  TempSrc:  Oral  Oral  SpO2: 94% 96% 95% 100%  Weight:    290 lb 3.2 oz (131.6 kg)  Height:        Intake/Output Summary (Last 24 hours) at 09/05/15 1255 Last data filed at 09/05/15 1222  Gross per 24 hour  Intake              720 ml  Output             1700 ml  Net             -980 ml   Filed Weights   09/03/15 0526 09/04/15 0545 09/05/15 0459  Weight: 295 lb 1.6 oz (133.9 kg) 290 lb 9.6 oz (131.8 kg) 290 lb 3.2 oz (131.6 kg)    Physical Exam    GEN: Well nourished, well developed, in no acute distress.  HEENT: normal.  Neck: Supple, no JVD, carotid bruits, or masses. Cardiac:  RRR, no murmurs, rubs, or gallops. No clubbing, cyanosis, 1+ bilat LE edema.  Radials/DP/PT 2+ and equal bilaterally.  Respiratory:  Respirations regular and unlabored, clear to auscultation bilaterally. GI: Soft, nontender, nondistended, BS + x 4. MS: no deformity or atrophy. Skin: warm and dry, no rash. Neuro:  Strength and sensation are intact. Psych: Normal affect.  Labs    Basic Metabolic Panel  Recent Labs  09/03/15 0436 09/05/15 0415  NA 141 139  K 5.3* 3.3*  CL 104 100*  CO2 29 29  GLUCOSE 128* 124*  BUN 29* 20  CREATININE 1.39* 1.20  CALCIUM 8.9 8.5*   Telemetry    rsr  Radiology    US Renal  Result Date: 09/02/2015 CLINICAL DATA:  Chronic kidney disease stage 2. EXAM: RENAL / URINARY TRACT ULTRASOUND COMPLETE COMPARISON:  None. FINDINGS: Right Kidney: Length: 11.8 cm. Echogenicity within normal limits.  No mass or hydronephrosis visualized. Left Kidney: Length: 13.4 cm. Echogenicity within normal limits. No mass or hydronephrosis visualized. Bladder: Appears normal for degree of bladder distention. IMPRESSION: Normal renal ultrasound. Electronically Signed   By: Lajean Manes M.D.   On: 09/02/2015 10:43   Dg Chest Portable 1 View  Result Date: 09/01/2015 CLINICAL DATA:  Shortness of breath for several days EXAM: PORTABLE CHEST 1 VIEW COMPARISON:  July 09, 2015 FINDINGS: There is no edema or consolidation. There is generalized cardiomegaly. Pulmonary vascularity is normal. No adenopathy. No bone lesions. IMPRESSION: Cardiomegaly.  No edema or consolidation. Electronically Signed   By: Lowella Grip III M.D.   On: 09/01/2015 11:18    Assessment & Plan    1.  Acute on chronic systolic chf/presumed NICM: EF 30-35%.  Continues to improve.  Breathing better.  Minus 11.5L for admission.  Wt down 14 lbs and is @ previous dry wt. Eager to go home.  Still with lower ext swelling.  On PO lasix per nephrology and renal fxn stable.  Will give a dose of lasix 40 IV x 1 prior  to d/c today.  He will need early CHF clinic f/u and also needs a scale @ home that goes up to 300 lbs.  Cont  blocker, acei, hydral, spiro.  2.  Hypertensive heart dzs:  145/77 this am.  Cont  blocker, acei, hydral, spiro.  Titrate hydralazine to TID and consider initiation of nitrate for additional afterload reduction.    3.  OSA:  Needs to use cpap.  4.  CKD II:  Stable.  On acei.  Renal following.  Signed, Murray Hodgkins NP   Attending Note Patient seen and examined, agree with detailed note above,  Patient presentation and plan discussed on rounds.   Patient reports dramatic improvement in his symptoms Still with mild pitting leg edema bilaterally Knows he "needs to do his part" Has been bad with medication compliance, diet, fluid intake. Watching CHF educational video this morning. Does not have a good scale at home (" nurses is supposed to help me")  Abdomen less distended though still not normal.  On clinical exam, lungs are relatively clear, heart sounds regular, no murmur appreciated, abdomen obese, nontender, nondistended, trace pitting edema to the mid shins bilaterally  Lab work reviewed showing low potassium 3.3, creatinine stable 1.2 and slowly trending downward CBC stable  --Acute on chronic diastolic and systolic CHF Will give additional Lasix 40 mill grams IV this morning Will need close follow-up in CHF clinic as well as our clinic We'll need continued education I did spend some time discussing taking metolazone for dramatic weight gain at home He seemed reluctant, reporting having previous dehydration episode on "some pill"  May need 60 mg twice a day for weight gain, 40 mg twice a day to maintain weight He seemed confused concerning fluid intake "If I don't drink, how can I pee" Discussed close monitoring of his weight with adjustment of diuretic based on weight gain   Long discussion concerning CHF management, education provided All questions  answered Greater than 50% was spent in counseling and coordination of care with patient Total encounter time 35 minutes or more   Signed: Esmond Plants  M.D., Ph.D. [facility]

## 2015-09-05 NOTE — Progress Notes (Signed)
[facility]  ROUNDING NOTE   Subjective:   Sitting in chair.  Transitioned to PO spironolactone and furosemide.   Objective:  Vital signs in last 24 hours:  Temp:  [97.8 F (36.6 C)-98.3 F (36.8 C)] 98.3 F (36.8 C) (08/22 0459) Pulse Rate:  [78-87] 78 (08/22 0459) Resp:  [18] 18 (08/22 0459) BP: (113-167)/(67-77) 145/77 (08/22 0459) SpO2:  [92 %-100 %] 100 % (08/22 0459) Weight:  [131.6 kg (290 lb 3.2 oz)] 131.6 kg (290 lb 3.2 oz) (08/22 0459)  Weight change: -0.181 kg (-6.4 oz) Filed Weights   09/03/15 0526 09/04/15 0545 09/05/15 0459  Weight: 133.9 kg (295 lb 1.6 oz) 131.8 kg (290 lb 9.6 oz) 131.6 kg (290 lb 3.2 oz)    Intake/Output: I/O last 3 completed shifts: In: 780.4 [P.O.:720; I.V.:60.4] Out: 3450 [Urine:3450]   Intake/Output this shift:  Total I/O In: 360 [P.O.:360] Out: 200 [Urine:200]  Physical Exam: General: No acute distress  Head: Normocephalic, atraumatic. Moist oral mucosal membranes  Eyes: Anicteric  Neck: Supple, trachea midline  Lungs:  Basilar rales, normal effort  Heart: S1S2 no rubs  Abdomen:  Soft, nontender, Bowel sounds present   Extremities: 1+ peripheral edema.  Neurologic: Nonfocal, moving all four extremities  Skin: No lesions       Basic Metabolic Panel:  Recent Labs Lab 09/01/15 1122 09/02/15 0449 09/03/15 0436 09/05/15 0415  NA 141 141 141 139  K 3.7 3.9 5.3* 3.3*  CL 108 102 104 100*  CO2 24 32 29 29  GLUCOSE 140* 179* 128* 124*  BUN 24* 22* 29* 20  CREATININE 1.32* 1.27* 1.39* 1.20  CALCIUM 9.2 9.1 8.9 8.5*  MG  --  1.9  --   --   PHOS  --  6.0*  --   --     Liver Function Tests: No results for input(s): AST, ALT, ALKPHOS, BILITOT, PROT, ALBUMIN in the last 168 hours. No results for input(s): LIPASE, AMYLASE in the last 168 hours. No results for input(s): AMMONIA in the last 168 hours.  CBC:  Recent Labs Lab 09/01/15 1122 09/02/15 0449  WBC 11.2* 10.4  NEUTROABS 9.0*  --   HGB 12.2*  13.8  HCT 36.9* 41.9  MCV 83.4 83.7  PLT 235 247    Cardiac Enzymes:  Recent Labs Lab 09/01/15 1122  TROPONINI 0.04*    BNP: Invalid input(s): POCBNP  CBG:  Recent Labs Lab 09/04/15 0829 09/04/15 1143 09/04/15 1649 09/04/15 2104 09/05/15 0831  GLUCAP 111* 102* 103* 178* 157*    Microbiology: Results for orders placed or performed during the hospital encounter of 09/01/15  MRSA PCR Screening     Status: None   Collection Time: 09/01/15  4:31 PM  Result Value Ref Range Status   MRSA by PCR NEGATIVE NEGATIVE Final    Comment:        The GeneXpert MRSA Assay (FDA approved for NASAL specimens only), is one component of a comprehensive MRSA colonization surveillance program. It is not intended to diagnose MRSA infection nor to guide or monitor treatment for MRSA infections.     Coagulation Studies: No results for input(s): LABPROT, INR in the last 72 hours.  Urinalysis: No results for input(s): COLORURINE, LABSPEC, PHURINE, GLUCOSEU, HGBUR, BILIRUBINUR, KETONESUR, PROTEINUR, UROBILINOGEN, NITRITE, LEUKOCYTESUR in the last 72 hours.  Invalid input(s): APPERANCEUR    Imaging: No results found.   Medications:     . antiseptic oral rinse  7 mL Mouth Rinse BID  . aspirin EC  81 mg Oral Daily  . carvedilol  12.5 mg Oral BID WC  . docusate sodium  100 mg Oral BID  . enoxaparin (LOVENOX) injection  40 mg Subcutaneous Q12H  . furosemide  40 mg Oral BID  . glipiZIDE  5 mg Oral QAC breakfast  . hydrALAZINE  25 mg Oral BID  . insulin aspart  0-15 Units Subcutaneous TID WC  . lisinopril  20 mg Oral Once  . lisinopril  40 mg Oral Daily  . potassium chloride  20 mEq Oral BID  . simvastatin  40 mg Oral Daily  . sodium chloride flush  3 mL Intravenous Q12H  . spironolactone  25 mg Oral Daily   sodium chloride, acetaminophen **OR** acetaminophen, albuterol, hydrALAZINE, ipratropium-albuterol, nitroGLYCERIN, ondansetron **OR** ondansetron (ZOFRAN) IV, sodium  chloride flush  Assessment/ Plan:  50 y.o. black male with systolic congestive heart failure, ejection fraction 40-45%, COPD, diabetes mellitus type 2, hyperlipidemia, hypertension, obesity, obstructive sleep apnea, not compliant with CPAP who was admitted to [facility] on 09/01/2015 for evaluation of shortness of breath, orthopnea, dyspnea with minimal exertion.   1. 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  - lisinopril  2. Hyperkalemia: now with hypokalemia - spironolactone started today - Continue potassium chloride.   3. Hypertension and acute systolic congestive heart failure.  better control - spironolactone and furosemide  - lisinopril, carvedilol   LOS: 4 Bradley Mckenzie 8/22/201711:00 AM

## 2015-09-06 LAB — ALPHA-1 ANTITRYPSIN PHENOTYPE: A-1 Antitrypsin, Ser: 162 mg/dL (ref 90–200)

## 2015-09-06 LAB — GLUCOSE, CAPILLARY: GLUCOSE-CAPILLARY: 138 mg/dL — AB (ref 65–99)

## 2015-09-07 ENCOUNTER — Ambulatory Visit: Payer: Self-pay | Admitting: Family

## 2015-09-13 ENCOUNTER — Other Ambulatory Visit
Admission: RE | Admit: 2015-09-13 | Discharge: 2015-09-13 | Disposition: A | Discharge status: Home / Self Care | Payer: Self-pay | Source: Ambulatory Visit | Attending: Nephrology | Admitting: Nephrology

## 2015-09-13 DIAGNOSIS — N182 Chronic kidney disease, stage 2 (mild): Secondary | ICD-10-CM | POA: Insufficient documentation

## 2015-09-13 LAB — COMPREHENSIVE METABOLIC PANEL
ALT: 25 U/L (ref 17–63)
AST: 21 U/L (ref 15–41)
Albumin: 4.1 g/dL (ref 3.5–5.0)
Alkaline Phosphatase: 96 U/L (ref 38–126)
Anion gap: 8 (ref 5–15)
BUN: 14 mg/dL (ref 6–20)
CHLORIDE: 104 mmol/L (ref 101–111)
CO2: 24 mmol/L (ref 22–32)
CREATININE: 1.31 mg/dL — AB (ref 0.61–1.24)
Calcium: 9.2 mg/dL (ref 8.9–10.3)
GFR calc Af Amer: >60 mL/min (ref >60)
GFR calc non Af Amer: >60 mL/min (ref >60)
GLUCOSE: 125 mg/dL — AB (ref 65–99)
Potassium: 3.9 mmol/L (ref 3.5–5.1)
Sodium: 136 mmol/L (ref 135–145)
Total Bilirubin: 0.4 mg/dL (ref 0.3–1.2)
Total Protein: 7.8 g/dL (ref 6.5–8.1)

## 2015-09-13 LAB — PROTEIN / CREATININE RATIO, URINE
Creatinine, Urine: 306 mg/dL
Protein Creatinine Ratio: 0.07 mg/mg{Cre} (ref 0.00–0.15)
Total Protein, Urine: 21 mg/dL

## 2015-09-21 ENCOUNTER — Encounter: Payer: Self-pay | Admitting: Family

## 2015-09-21 ENCOUNTER — Ambulatory Visit: Payer: Self-pay | Attending: Family | Admitting: Family

## 2015-09-21 VITALS — BP 162/90 | HR 88 | Resp 18 | Ht 69.0 in | Wt 283.0 lb

## 2015-09-21 DIAGNOSIS — E119 Type 2 diabetes mellitus without complications: Secondary | ICD-10-CM | POA: Insufficient documentation

## 2015-09-21 DIAGNOSIS — I110 Hypertensive heart disease with heart failure: Secondary | ICD-10-CM | POA: Insufficient documentation

## 2015-09-21 DIAGNOSIS — G4733 Obstructive sleep apnea (adult) (pediatric): Secondary | ICD-10-CM | POA: Insufficient documentation

## 2015-09-21 DIAGNOSIS — Z809 Family history of malignant neoplasm, unspecified: Secondary | ICD-10-CM | POA: Insufficient documentation

## 2015-09-21 DIAGNOSIS — Z87891 Personal history of nicotine dependence: Secondary | ICD-10-CM | POA: Insufficient documentation

## 2015-09-21 DIAGNOSIS — Z6841 Body Mass Index (BMI) 40.0 and over, adult: Secondary | ICD-10-CM | POA: Insufficient documentation

## 2015-09-21 DIAGNOSIS — I10 Essential (primary) hypertension: Secondary | ICD-10-CM

## 2015-09-21 DIAGNOSIS — I429 Cardiomyopathy, unspecified: Secondary | ICD-10-CM | POA: Insufficient documentation

## 2015-09-21 DIAGNOSIS — Z8249 Family history of ischemic heart disease and other diseases of the circulatory system: Secondary | ICD-10-CM | POA: Insufficient documentation

## 2015-09-21 DIAGNOSIS — Z833 Family history of diabetes mellitus: Secondary | ICD-10-CM | POA: Insufficient documentation

## 2015-09-21 DIAGNOSIS — I5022 Chronic systolic (congestive) heart failure: Secondary | ICD-10-CM | POA: Insufficient documentation

## 2015-09-21 DIAGNOSIS — E785 Hyperlipidemia, unspecified: Secondary | ICD-10-CM | POA: Insufficient documentation

## 2015-09-21 NOTE — Patient Instructions (Signed)
Continue weighing daily and call for an overnight weight gain of > 2 pounds or a weekly weight gain of >5 pounds. 

## 2015-09-21 NOTE — Progress Notes (Signed)
Subjective:    Patient ID: Bradley Mckenzie., male    DOB: 08-18-1965, 50 y.o.   MRN: [id number]  Congestive Heart Failure  Presents for follow-up visit. The disease course has been stable. Associated symptoms include fatigue ("better"). Pertinent negatives include no abdominal pain, chest pain, edema, orthopnea, palpitations or shortness of breath. The symptoms have been improving. Past treatments include beta blockers, ACE inhibitors, aldosterone receptor blockers and salt and fluid restriction. The treatment provided significant relief. Compliance with prior treatments has been good. Prior compliance problems include medication issues (due to cost). His past medical history is significant for DM and HTN. He has multiple 1st degree relatives with heart disease.  Hypertension  This is a chronic problem. The current episode started more than 1 year ago. The problem has been waxing and waning since onset. Pertinent negatives include no chest pain, neck pain, palpitations, peripheral edema or shortness of breath. There are no associated agents to hypertension. Risk factors for coronary artery disease include diabetes mellitus, dyslipidemia, family history, obesity and male gender. Past treatments include calcium channel blockers, beta blockers, diuretics, lifestyle changes and ACE inhibitors. The current treatment provides mild improvement. Compliance problems include medication cost and exercise.  Hypertensive end-organ damage includes heart failure.    Past Medical History:  Diagnosis Date  . Cardiomyopathy ([place])    a. 01/2014 Echo: EF 25-30%;  b. ? ischemic vs non-ischemic.  He's never had an ischemic eval.  . Chronic combined systolic (congestive) and diastolic (congestive) heart failure ([place])    a. 01/2014 Echo: EF 25-30%. b. Echo 03/2015: Improved EF of 40-45%  . Depression   . Diabetes mellitus without complication ([place])    a. Not previously on outpt meds.  . Hyperlipidemia   . Hypertensive  heart disease    a. Since his 81's.  . Morbid obesity ([place])   . Obstructive sleep apnea    a. Has not used CPAP since ~ 2012.    Past Surgical History:  Procedure Laterality Date  . HERNIA REPAIR  2004    Family History  Problem Relation Age of Onset  . Heart disease Mother     alive & well.  [name] Heart attack Father 70    died @ 42 of cancer.  . Diabetes Father   . Cancer Father   . Cancer Sister     Social History  Substance Use Topics  . Smoking status: Former Research scientist (life sciences)  . Smokeless tobacco: Never Used     Comment: Quit in his 20's.  . Alcohol use 0.0 oz/week     Comment: occassional alcohol use at special events    No Known Allergies  Prior to Admission medications   Medication Sig Start Date End Date Taking? Authorizing Provider  albuterol (PROVENTIL HFA;VENTOLIN HFA) 108 (90 Base) MCG/ACT inhaler Inhale 2 puffs into the lungs every 6 (six) hours as needed for wheezing or shortness of breath. Reported on 07/08/2015 07/10/15  Yes Vaughan Basta, MD  amLODipine (NORVASC) 10 MG tablet Take 10 mg by mouth daily.   Yes Historical Provider, MD  aspirin EC 81 MG tablet Take 1 tablet (81 mg total) by mouth daily. 07/10/15  Yes Vaughan Basta, MD  carvedilol (COREG) 12.5 MG tablet Take 1 tablet (12.5 mg total) by mouth 2 (two) times daily with a meal. 07/10/15  Yes Vaughan Basta, MD  furosemide (LASIX) 40 MG tablet Take 1 tablet (40 mg total) by mouth 2 (two) times daily. 09/05/15  Yes Fritzi Mandes, MD  hydrALAZINE (APRESOLINE) 25 MG tablet Take 1 tablet (25 mg total) by mouth 2 (two) times daily. 09/05/15  Yes Fritzi Mandes, MD  lisinopril (PRINIVIL,ZESTRIL) 20 MG tablet Take 1 tablet (20 mg total) by mouth daily. 07/10/15  Yes Vaughan Basta, MD  lovastatin (MEVACOR) 40 MG tablet Take 1 tablet (40 mg total) by mouth at bedtime. 09/05/15  Yes Fritzi Mandes, MD  potassium chloride SA (K-DUR,KLOR-CON) 20 MEQ tablet Take 1 tablet (20 mEq total) by mouth 2 (two) times  daily. 07/10/15  Yes Vaughan Basta, MD  spironolactone (ALDACTONE) 25 MG tablet Take 1 tablet (25 mg total) by mouth daily. 09/05/15  Yes Fritzi Mandes, MD  glipiZIDE (GLUCOTROL) 5 MG tablet Take 1 tablet (5 mg total) by mouth daily before breakfast. Patient not taking: Reported on 09/21/2015 09/05/15   Fritzi Mandes, MD     Review of Systems  Constitutional: Positive for fatigue ("better"). Negative for appetite change.  HENT: Negative for congestion, postnasal drip and sore throat.   Eyes: Negative.   Respiratory: Negative for chest tightness and shortness of breath.   Cardiovascular: Negative for chest pain, palpitations and leg swelling.  Gastrointestinal: Negative for abdominal distention and abdominal pain.  Endocrine: Negative.   Genitourinary: Negative.   Musculoskeletal: Negative for back pain and neck pain.  Skin: Negative.   Allergic/Immunologic: Negative.   Neurological: Negative for dizziness and light-headedness.  Hematological: Negative for adenopathy. Does not bruise/bleed easily.  Psychiatric/Behavioral: Negative for dysphoric mood and sleep disturbance (sleeping with CPAP). The patient is not nervous/anxious.        Objective:   Physical Exam  Constitutional: He is oriented to person, place, and time. He appears well-developed and well-nourished.  HENT:  Head: Normocephalic and atraumatic.  Eyes: Conjunctivae are normal. Pupils are equal, round, and reactive to light.  Neck: Normal range of motion. Neck supple.  Cardiovascular: Normal rate and regular rhythm.   Pulmonary/Chest: Effort normal. He has no wheezes. He has no rales.  Abdominal: Soft. He exhibits no distension. There is no tenderness.  Musculoskeletal: He exhibits no edema or tenderness.  Neurological: He is alert and oriented to person, place, and time.  Skin: Skin is warm and dry.  Psychiatric: He has a normal mood and affect. His behavior is normal. Thought content normal.  Nursing note and vitals  reviewed.  BP (!) 162/90   Pulse 88   Resp 18   Ht 5\' 9"  (1.753 m)   Wt 283 lb (128.4 kg)   SpO2 100%   BMI 41.79 kg/m         Assessment & Plan:  1: Chronic heart failure with reduced ejection fraction- Patient presents with minimal fatigue with moderate exertion (Class II) which quickly improves with rest. He denies any shortness of breath or swelling in his legs or abdomen. He continues to weigh himself and reports a weight loss. By our scale, he's lost 23.8 pounds since he was last here. Reminded to call for an overnight weight gain of >2 pounds or a weekly weight gain of >5 pounds. He says that he's been taking all of his heart medications and hasn't run out of anything. He is trying to get an appointment scheduled with [facility]. 2: HTN- Blood pressure mildly elevated today and may need to adjust medication if it remains elevated.  3: Obstructive sleep apnea- He says that he is now wearing his CPAP on a nightly basis so hopefully his blood pressure will improve as he continues to wear his  CPAP. 4: Diabetes- He is supposed to be taking glipizide but he says that he can't afford this one right now and he doesn't have a glucometer anyways. Last glucose on 09/13/15 was 125. Encouraged him to discuss this with [facility] when he gets established.   Patient did not bring his medications nor a list. Each medication was verbally reviewed with the patient and he was encouraged to bring the bottles to every visit to confirm accuracy of list.  Return here in 3 months or sooner for any questions/problems before then.

## 2015-09-25 ENCOUNTER — Other Ambulatory Visit: Payer: Self-pay | Admitting: Family

## 2015-09-25 MED ORDER — LOVASTATIN 40 MG PO TABS: 40.0000 mg | ORAL_TABLET | Freq: Every day | ORAL | 3 refills | Status: DC

## 2015-09-25 MED ORDER — CARVEDILOL 12.5 MG PO TABS: 12.5000 mg | ORAL_TABLET | Freq: Two times a day (BID) | ORAL | 3 refills | Status: DC

## 2015-09-25 MED ORDER — GLIPIZIDE 5 MG PO TABS: 5.0000 mg | ORAL_TABLET | Freq: Every day | ORAL | 3 refills | Status: DC

## 2015-09-25 MED ORDER — ASPIRIN EC 81 MG PO TBEC: 81.0000 mg | DELAYED_RELEASE_TABLET | Freq: Every day | ORAL | 3 refills | Status: DC

## 2015-09-25 MED ORDER — LISINOPRIL 20 MG PO TABS: 20.0000 mg | ORAL_TABLET | Freq: Every day | ORAL | 3 refills | Status: DC

## 2015-09-25 MED ORDER — HYDRALAZINE HCL 25 MG PO TABS: 25.0000 mg | ORAL_TABLET | Freq: Two times a day (BID) | ORAL | 3 refills | Status: DC

## 2015-09-25 MED ORDER — SPIRONOLACTONE 25 MG PO TABS: 25.0000 mg | ORAL_TABLET | Freq: Every day | ORAL | 3 refills | Status: DC

## 2015-09-25 MED ORDER — POTASSIUM CHLORIDE CRYS ER 20 MEQ PO TBCR
20.0000 meq | EXTENDED_RELEASE_TABLET | Freq: Two times a day (BID) | ORAL | 3 refills | Status: DC
Start: 2015-09-25 — End: 2015-12-21

## 2015-09-25 MED ORDER — AMLODIPINE BESYLATE 10 MG PO TABS: 10.0000 mg | ORAL_TABLET | Freq: Every day | ORAL | 5 refills | Status: DC

## 2015-09-25 MED ORDER — ALBUTEROL SULFATE HFA 108 (90 BASE) MCG/ACT IN AERS
2.0000 | INHALATION_SPRAY | Freq: Four times a day (QID) | RESPIRATORY_TRACT | 3 refills | Status: DC | PRN
Start: 2015-09-25 — End: 2016-12-04

## 2015-09-25 MED ORDER — FUROSEMIDE 40 MG PO TABS: 40.0000 mg | ORAL_TABLET | Freq: Two times a day (BID) | ORAL | 3 refills | Status: DC

## 2015-09-26 ENCOUNTER — Other Ambulatory Visit: Payer: Self-pay | Admitting: Family

## 2015-09-26 ENCOUNTER — Telehealth: Payer: Self-pay | Admitting: Family

## 2015-09-26 MED ORDER — LOVASTATIN 20 MG PO TABS: 20.0000 mg | ORAL_TABLET | Freq: Every day | ORAL | 5 refills | Status: DC

## 2015-09-26 MED ORDER — LISINOPRIL 20 MG PO TABS: 40.0000 mg | ORAL_TABLET | Freq: Every day | ORAL | 3 refills | Status: DC

## 2015-09-26 NOTE — Telephone Encounter (Signed)
Returned patient's call regarding the number of furosemide tablets that he recently received from his pharmacy. He takes 1 tablet twice daily so should have had 60 tablets. He says that he only received 52 tablets. Advised patient to call his pharmacy to discuss. Patient says that he will call them and was appreciative of the call back.

## 2015-09-26 NOTE — Telephone Encounter (Signed)
Patient called to say that he picked up all his medications yesterday except his amlodipine and lovastatin because neither of those medications were on the [facility] $4.00 list. Looked up the $4.00 list and also called the pharmacy. Will increase his lisinopril to 40mg  daily by taking two of his 20mg  tablets once daily. This copay will be $8.00. Will also place on lovastatin 20mg  daily which is on the $4.00 list. Patient informed of cost and says that he can do that.  He also mentions that he's picked up a few pounds over the weekend because he was out of his medications. Home weight is now 286 pounds although he didn't get to weigh over the weekend because he was out of town. Since he resumed his medications yesterday, advised him to double his furosemide today and to let me know if his weight doesn't go back down.   Follow-up appointment made here on 10/11/15 and will check lab work at that time.

## 2015-09-29 ENCOUNTER — Telehealth: Payer: Self-pay | Admitting: Family

## 2015-09-29 NOTE — Telephone Encounter (Signed)
Patient called to say that his legs are becoming more swollen at the end of the day where he can leave an indent in his lower legs. Weight has been stable at 284 pounds for the last couple of days. No worsening of his shortness of breath. Advised patient to increase his spironolactone to 50mg  daily by taking two of his 25mg  tablets daily. He is to take an extra one today since he already took his medications. Currently has an appointment scheduled on October 11, 2015 at 11:00am. He is to call back sooner if his legs remain swollen or if his weight goes up >2 pounds overnight.

## 2015-10-11 ENCOUNTER — Encounter: Payer: Self-pay | Admitting: Family

## 2015-10-11 ENCOUNTER — Ambulatory Visit: Payer: Self-pay | Attending: Family | Admitting: Family

## 2015-10-11 VITALS — BP 147/71 | HR 86 | Resp 18 | Ht 69.0 in | Wt 282.0 lb

## 2015-10-11 DIAGNOSIS — Z8249 Family history of ischemic heart disease and other diseases of the circulatory system: Secondary | ICD-10-CM | POA: Insufficient documentation

## 2015-10-11 DIAGNOSIS — I429 Cardiomyopathy, unspecified: Secondary | ICD-10-CM | POA: Insufficient documentation

## 2015-10-11 DIAGNOSIS — I5042 Chronic combined systolic (congestive) and diastolic (congestive) heart failure: Secondary | ICD-10-CM | POA: Insufficient documentation

## 2015-10-11 DIAGNOSIS — E785 Hyperlipidemia, unspecified: Secondary | ICD-10-CM | POA: Insufficient documentation

## 2015-10-11 DIAGNOSIS — Z809 Family history of malignant neoplasm, unspecified: Secondary | ICD-10-CM | POA: Insufficient documentation

## 2015-10-11 DIAGNOSIS — Z87891 Personal history of nicotine dependence: Secondary | ICD-10-CM | POA: Insufficient documentation

## 2015-10-11 DIAGNOSIS — I5022 Chronic systolic (congestive) heart failure: Secondary | ICD-10-CM

## 2015-10-11 DIAGNOSIS — Z833 Family history of diabetes mellitus: Secondary | ICD-10-CM | POA: Insufficient documentation

## 2015-10-11 DIAGNOSIS — E119 Type 2 diabetes mellitus without complications: Secondary | ICD-10-CM | POA: Insufficient documentation

## 2015-10-11 DIAGNOSIS — G4733 Obstructive sleep apnea (adult) (pediatric): Secondary | ICD-10-CM | POA: Insufficient documentation

## 2015-10-11 DIAGNOSIS — I110 Hypertensive heart disease with heart failure: Secondary | ICD-10-CM | POA: Insufficient documentation

## 2015-10-11 DIAGNOSIS — Z6841 Body Mass Index (BMI) 40.0 and over, adult: Secondary | ICD-10-CM | POA: Insufficient documentation

## 2015-10-11 DIAGNOSIS — I10 Essential (primary) hypertension: Secondary | ICD-10-CM

## 2015-10-11 DIAGNOSIS — Z7982 Long term (current) use of aspirin: Secondary | ICD-10-CM | POA: Insufficient documentation

## 2015-10-11 DIAGNOSIS — F329 Major depressive disorder, single episode, unspecified: Secondary | ICD-10-CM | POA: Insufficient documentation

## 2015-10-11 LAB — BASIC METABOLIC PANEL
Anion gap: 8 (ref 5–15)
BUN: 19 mg/dL (ref 6–20)
CHLORIDE: 102 mmol/L (ref 101–111)
CO2: 28 mmol/L (ref 22–32)
Calcium: 9.6 mg/dL (ref 8.9–10.3)
Creatinine, Ser: 1.28 mg/dL — ABNORMAL HIGH (ref 0.61–1.24)
GFR calc non Af Amer: >60 mL/min (ref >60)
Glucose, Bld: 118 mg/dL — ABNORMAL HIGH (ref 65–99)
POTASSIUM: 4.1 mmol/L (ref 3.5–5.1)
SODIUM: 138 mmol/L (ref 135–145)

## 2015-10-11 NOTE — Progress Notes (Signed)
Patient ID: Bradley Mckenzie., male    DOB: September 08, 1965, 50 y.o.   MRN: [id number]  HPI  Bradley Mckenzie is a 50 y/o male who has a history of diabetes, HTN, obstructive sleep apnea, hyperlipidemia, morbid obesity and heart failure with a reduced ejection fraction. Remote tobacco exposure.   Last echo was done 09/02/15 with an EF of 30-35%, mild mitral regurgitation and no aortic stenosis. EF has declined from 40-45% in March 2017 but he has been inconsistent taking his medications & not using his CPAP during this time.  Recently admitted to [facility] 09/01/15 with acute exacerbation of HF and uncontrolled HTN. Was given IV diuretics and assistance with CPAP. Also started on glipizide.  Returns today for a follow-up after having his spironolactone increased to 50mg  daily on 09/29/15 due to worsening edema. Since then, he's been feeling better with minimal edema in his lower legs. Currently without any shortness of breath. Weight chart from home reviewed and it shows a gradual weight loss. Reports wearing his CPAP on a nightly basis. Now walking 30-45 minutes 3-4 days/week.   Past Medical History:  Diagnosis Date  . Cardiomyopathy ([place])    a. 01/2014 Echo: EF 25-30%;  b. ? ischemic vs non-ischemic.  He's never had an ischemic eval.  . Chronic combined systolic (congestive) and diastolic (congestive) heart failure ([place])    a. 01/2014 Echo: EF 25-30%. b. Echo 03/2015: Improved EF of 40-45%  . Depression   . Diabetes mellitus without complication ([place])    a. Not previously on outpt meds.  . Hyperlipidemia   . Hypertensive heart disease    a. Since his 63's.  . Morbid obesity ([place])   . Obstructive sleep apnea    a. Has not used CPAP since ~ 2012.    Past Surgical History:  Procedure Laterality Date  . HERNIA REPAIR  2004    Family History  Problem Relation Age of Onset  . Heart disease Mother     alive & well.  [name] Heart attack Father 37    died @ 24 of cancer.  . Diabetes Father   . Cancer  Father   . Cancer Sister     Social History  Substance Use Topics  . Smoking status: Former Research scientist (life sciences)  . Smokeless tobacco: Never Used     Comment: Quit in his 20's.  . Alcohol use 0.0 oz/week     Comment: occassional alcohol use at special events    No Known Allergies  Prior to Admission medications   Medication Sig Start Date End Date Taking? Authorizing Provider  albuterol (PROVENTIL HFA;VENTOLIN HFA) 108 (90 Base) MCG/ACT inhaler Inhale 2 puffs into the lungs every 6 (six) hours as needed for wheezing or shortness of breath. Reported on 07/08/2015 09/25/15  Yes Bradley Graff, FNP  aspirin EC 81 MG tablet Take 1 tablet (81 mg total) by mouth daily. 09/25/15  Yes Bradley Graff, FNP  carvedilol (COREG) 12.5 MG tablet Take 1 tablet (12.5 mg total) by mouth 2 (two) times daily with a meal. 09/25/15  Yes Bradley Graff, FNP  furosemide (LASIX) 40 MG tablet Take 1 tablet (40 mg total) by mouth 2 (two) times daily. 09/25/15  Yes Bradley Graff, FNP  glipiZIDE (GLUCOTROL) 5 MG tablet Take 1 tablet (5 mg total) by mouth daily before breakfast. 09/25/15  Yes Bradley Graff, FNP  hydrALAZINE (APRESOLINE) 25 MG tablet Take 1 tablet (25 mg total) by mouth 2 (two) times daily. 09/25/15  Yes  Bradley Graff, FNP  lisinopril (PRINIVIL,ZESTRIL) 20 MG tablet Take 2 tablets (40 mg total) by mouth daily. 09/26/15  Yes Bradley Graff, FNP  potassium chloride SA (K-DUR,KLOR-CON) 20 MEQ tablet Take 1 tablet (20 mEq total) by mouth 2 (two) times daily. 09/25/15  Yes Bradley Graff, FNP  spironolactone (ALDACTONE) 25 MG tablet Take 50 mg by mouth daily.   Yes Historical Provider, MD  lovastatin (MEVACOR) 20 MG tablet Take 1 tablet (20 mg total) by mouth at bedtime. D/C 40mg  dosage Patient not taking: Reported on 10/11/2015 09/26/15   Bradley Graff, FNP     Review of Systems  Constitutional: Positive for fatigue (better). Negative for appetite change.  HENT: Positive for postnasal drip and sneezing. Negative for  congestion and sore throat.   Eyes: Negative.   Respiratory: Negative for cough, chest tightness and shortness of breath.   Cardiovascular: Positive for leg swelling. Negative for chest pain and palpitations.  Gastrointestinal: Negative for abdominal distention and abdominal pain.  Endocrine: Negative.   Genitourinary: Negative.   Musculoskeletal: Negative for back pain and neck pain.  Skin: Negative.   Allergic/Immunologic: Negative.   Neurological: Negative for dizziness and light-headedness.  Hematological: Negative for adenopathy. Does not bruise/bleed easily.  Psychiatric/Behavioral: Negative for dysphoric mood. The patient is not nervous/anxious.    Vitals:   10/11/15 1114  BP: (!) 147/71  Pulse: 86  Resp: 18  SpO2: 98%  Weight: 282 lb (127.9 kg)  Height: 5\' 9"  (1.753 m)      Physical Exam  Constitutional: He is oriented to person, place, and time. He appears well-developed and well-nourished.  HENT:  Head: Normocephalic and atraumatic.  Eyes: Conjunctivae are normal. Pupils are equal, round, and reactive to light.  Neck: Normal range of motion. Neck supple.  Cardiovascular: Normal rate and regular rhythm.   Pulmonary/Chest: Effort normal. He has no wheezes. He has no rales.  Abdominal: Soft. He exhibits no distension. There is no tenderness.  Musculoskeletal: He exhibits edema (trace edema in bilateral lower legs). He exhibits no tenderness.  Neurological: He is alert and oriented to person, place, and time.  Skin: Skin is warm and dry.  Psychiatric: He has a normal mood and affect. His behavior is normal. Thought content normal.  Nursing note and vitals reviewed.    Assessment & Plan:  1: Chronic heart failure with reduced ejection fraction-  - NYHA Class II - Volume status is minimally elevated with trace amount pedal edema - Continue weighing daily and call for an overnight weight gain of >2 pounds or a weekly weight gain of >5 pounds - Getting a basic  metabolic panel drawn today since spironolactone increased - Could titrate up carvedilol at his next visit.  - Could consider switching his lisinopril to entresto but would have to use the company's foundation assistance as he's currently without any insurance and gets his medications off the $4.00 list at [facility]. He does not want to go through [facility].   2: HTN- - Slightly elevated but much improved from previous visits - Since 09/01/15, he's lost 25.6 pounds - Home bp readings were reviewed and are improving as well - Picked up application for [facility] yesterday  3: Obstructive sleep apnea- - Reports wearing his CPAP on a nightly basis  4: Diabetes- - Taking glipizide daily - Not checking his glucose as he doesn't have a glucometer. Hoping that [facility] can help him obtain one.  Return in 1 month  or sooner for any questions/problems before then.

## 2015-10-11 NOTE — Patient Instructions (Signed)
Continue weighing daily and call for an overnight weight gain of > 2 pounds or a weekly weight gain of >5 pounds. 

## 2015-10-12 ENCOUNTER — Telehealth: Payer: Self-pay | Admitting: Family

## 2015-10-12 NOTE — Telephone Encounter (Signed)
Patient called and informed of his lab work that was drawn yesterday. Advised him that his sodium and potassium levels were normal and his renal function had improved. Continue medications.

## 2015-10-19 ENCOUNTER — Other Ambulatory Visit: Payer: Self-pay | Admitting: Family

## 2015-10-19 MED ORDER — LOVASTATIN 20 MG PO TABS: 20.0000 mg | ORAL_TABLET | Freq: Every day | ORAL | 5 refills | Status: DC

## 2015-10-19 NOTE — Progress Notes (Signed)
Patient says that his blood pressure has been running great at home with readings of 126-138/57-83. He was wondering if he needed to continue taking the amlodipine as it wasn't on the $4.00 list at [place] and he won't be able to afford it. Advised him to continue checking his blood pressure at home and if it begins to rise to call me back and we could adjust something that he's already taking.

## 2015-11-06 ENCOUNTER — Encounter: Payer: Self-pay | Admitting: Family

## 2015-11-06 ENCOUNTER — Ambulatory Visit: Payer: Self-pay | Attending: Family | Admitting: Family

## 2015-11-06 VITALS — BP 161/76 | HR 78 | Resp 18 | Ht 69.0 in | Wt 284.0 lb

## 2015-11-06 DIAGNOSIS — I5022 Chronic systolic (congestive) heart failure: Secondary | ICD-10-CM

## 2015-11-06 DIAGNOSIS — I10 Essential (primary) hypertension: Secondary | ICD-10-CM

## 2015-11-06 DIAGNOSIS — I5042 Chronic combined systolic (congestive) and diastolic (congestive) heart failure: Secondary | ICD-10-CM | POA: Insufficient documentation

## 2015-11-06 DIAGNOSIS — G4733 Obstructive sleep apnea (adult) (pediatric): Secondary | ICD-10-CM | POA: Insufficient documentation

## 2015-11-06 DIAGNOSIS — I110 Hypertensive heart disease with heart failure: Secondary | ICD-10-CM | POA: Insufficient documentation

## 2015-11-06 DIAGNOSIS — Z87891 Personal history of nicotine dependence: Secondary | ICD-10-CM | POA: Insufficient documentation

## 2015-11-06 DIAGNOSIS — E119 Type 2 diabetes mellitus without complications: Secondary | ICD-10-CM | POA: Insufficient documentation

## 2015-11-06 DIAGNOSIS — E785 Hyperlipidemia, unspecified: Secondary | ICD-10-CM | POA: Insufficient documentation

## 2015-11-06 DIAGNOSIS — Z7982 Long term (current) use of aspirin: Secondary | ICD-10-CM | POA: Insufficient documentation

## 2015-11-06 MED ORDER — SPIRONOLACTONE 25 MG PO TABS: 50.0000 mg | ORAL_TABLET | Freq: Every day | ORAL | 3 refills | Status: DC

## 2015-11-06 NOTE — Progress Notes (Signed)
Patient ID: Bradley Sult., male    DOB: 05-02-1965, 50 y.o.   MRN: [id number]  HPI  Bradley Mckenzie is a 50 y/o male who has a history of diabetes, HTN, obstructive sleep apnea, hyperlipidemia, morbid obesity and heart failure with a reduced ejection fraction. Remote tobacco exposure.   Last echo was done 09/02/15 with an EF of 30-35%, mild mitral regurgitation and no aortic stenosis. EF has declined from 40-45% in March 2017 but he had been inconsistent taking his medications & not using his CPAP during this time.  Recently admitted to [facility] 09/01/15 with acute exacerbation of HF and uncontrolled HTN. Was given IV diuretics and assistance with CPAP. Also started on glipizide.  Returns today for a follow-up with fatigue and shortness of breath with exertion. Feels like his shortness of breath is a little bit worse. Continues with chronic lower extremity edema. Home weight chart reviewed and over the last month, he's gained about 5 pounds. Continues to wear his CPAP nightly. Is only taking spironolactone 25mg  daily and is unable to afford the potassium tablets. Continues to walk 30-45 minutes 3-4 days/week.   Past Medical History:  Diagnosis Date  . Cardiomyopathy ([place])    a. 01/2014 Echo: EF 25-30%;  b. ? ischemic vs non-ischemic.  He's never had an ischemic eval.  . Chronic combined systolic (congestive) and diastolic (congestive) heart failure    a. 01/2014 Echo: EF 25-30%. b. Echo 03/2015: Improved EF of 40-45%  . Depression   . Diabetes mellitus without complication ([place])    a. Not previously on outpt meds.  . Hyperlipidemia   . Hypertensive heart disease    a. Since his 38's.  . Morbid obesity ([place])   . Obstructive sleep apnea    a. Has not used CPAP since ~ 2012.    Past Surgical History:  Procedure Laterality Date  . HERNIA REPAIR  2004    Family History  Problem Relation Age of Onset  . Heart disease Mother     alive & well.  [name] Heart attack Father 65    died @ 56 of cancer.   . Diabetes Father   . Cancer Father   . Cancer Sister     Social History  Substance Use Topics  . Smoking status: Former Research scientist (life sciences)  . Smokeless tobacco: Never Used     Comment: Quit in his 20's.  . Alcohol use 0.0 oz/week     Comment: occassional alcohol use at special events    No Known Allergies  Prior to Admission medications   Medication Sig Start Date End Date Taking? Authorizing Provider  albuterol (PROVENTIL HFA;VENTOLIN HFA) 108 (90 Base) MCG/ACT inhaler Inhale 2 puffs into the lungs every 6 (six) hours as needed for wheezing or shortness of breath. Reported on 07/08/2015 09/25/15  Yes Alisa Graff, FNP  aspirin EC 81 MG tablet Take 1 tablet (81 mg total) by mouth daily. 09/25/15  Yes Alisa Graff, FNP  carvedilol (COREG) 12.5 MG tablet Take 1 tablet (12.5 mg total) by mouth 2 (two) times daily with a meal. 09/25/15  Yes Alisa Graff, FNP  furosemide (LASIX) 40 MG tablet Take 1 tablet (40 mg total) by mouth 2 (two) times daily. 09/25/15  Yes Alisa Graff, FNP  glipiZIDE (GLUCOTROL) 5 MG tablet Take 1 tablet (5 mg total) by mouth daily before breakfast. 09/25/15  Yes Alisa Graff, FNP  hydrALAZINE (APRESOLINE) 25 MG tablet Take 1 tablet (25 mg total) by mouth 2 (two)  times daily. 09/25/15  Yes Alisa Graff, FNP  lisinopril (PRINIVIL,ZESTRIL) 20 MG tablet Take 2 tablets (40 mg total) by mouth daily. 09/26/15  Yes Alisa Graff, FNP  lovastatin (MEVACOR) 20 MG tablet Take 1 tablet (20 mg total) by mouth at bedtime. D/C 40mg  dosage 10/19/15  Yes Alisa Graff, FNP  potassium chloride SA (K-DUR,KLOR-CON) 20 MEQ tablet Take 1 tablet (20 mEq total) by mouth 2 (two) times daily. Patient not taking: Reported on 11/06/2015 09/25/15   Alisa Graff, FNP  spironolactone (ALDACTONE) 25 MG tablet Take 2 tablets (50 mg total) by mouth daily. 11/06/15   Alisa Graff, FNP     Review of Systems  Constitutional: Positive for fatigue. Negative for appetite change.  HENT: Positive for  congestion. Negative for rhinorrhea and sore throat.   Eyes: Negative.   Respiratory: Positive for shortness of breath. Negative for cough, chest tightness and wheezing.   Cardiovascular: Positive for leg swelling. Negative for chest pain and palpitations.  Gastrointestinal: Negative for abdominal distention and abdominal pain.  Endocrine: Negative.   Genitourinary: Negative.   Musculoskeletal: Positive for back pain (when walking too much). Negative for neck pain.  Skin: Negative.   Allergic/Immunologic: Negative.   Neurological: Negative for dizziness and light-headedness.  Hematological: Negative for adenopathy. Does not bruise/bleed easily.  Psychiatric/Behavioral: Negative for dysphoric mood and sleep disturbance (wearing CPAP nightly). The patient is not nervous/anxious.    Vitals:   11/06/15 1138  BP: (!) 161/76  Pulse: 78  Resp: 18  SpO2: 99%  Weight: 284 lb (128.8 kg)  Height: 5\' 9"  (1.753 m)      Physical Exam  Constitutional: He is oriented to person, place, and time. He appears well-developed and well-nourished.  HENT:  Head: Normocephalic and atraumatic.  Eyes: Conjunctivae are normal. Pupils are equal, round, and reactive to light.  Neck: Normal range of motion. Neck supple.  Cardiovascular: Normal rate and regular rhythm.   Pulmonary/Chest: Effort normal. He has no wheezes. He has no rales.  Abdominal: Soft. He exhibits no distension. There is no tenderness.  Musculoskeletal: He exhibits edema (1+ pitting edema in bilateral lower legs). He exhibits no tenderness.  Neurological: He is alert and oriented to person, place, and time.  Skin: Skin is warm and dry.  Psychiatric: He has a normal mood and affect. His behavior is normal. Thought content normal.  Nursing note and vitals reviewed.    Assessment & Plan:  1: Chronic heart failure with reduced ejection fraction-  - NYHA Class II - Volume status is minimally elevated with 1+pitting pedal edema - Continue  weighing daily and call for an overnight weight gain of >2 pounds or a weekly weight gain of >5 pounds - He's back to 25mg  spironolactone daily for some reason. New RX sent in for 25mg  tablet to take 2 tablets once daily.  - Could consider titrating up carvedilol at his next visit.  - Could consider switching his lisinopril to entresto but would have to use the company's foundation assistance as he's currently without any insurance and gets his medications off the $4.00 list at [facility]. He does not want to go through [facility].   2: HTN- - Slightly elevated  - Home weight up 5 pounds in the last month and 2 pounds heavier from our last visit - spironolactone being increased per above - Application turned in to [facility]  3: Obstructive sleep apnea- - Reports wearing his CPAP on a nightly basis  4: Diabetes- - Taking glipizide daily - Not checking his glucose as he doesn't have a glucometer. Hoping that [facility] can help him obtain one.  Return in 2 weeks or sooner for any questions/problems before then. Will check a basic metabolic panel at that time since increasing spironolactone to 50mg  daily.

## 2015-11-06 NOTE — Patient Instructions (Signed)
Continue weighing daily and call for an overnight weight gain of > 2 pounds or a weekly weight gain of >5 pounds. 

## 2015-11-22 ENCOUNTER — Encounter: Payer: Self-pay | Admitting: Family

## 2015-11-22 ENCOUNTER — Ambulatory Visit: Payer: Self-pay | Attending: Family | Admitting: Family

## 2015-11-22 ENCOUNTER — Telehealth: Payer: Self-pay | Admitting: Family

## 2015-11-22 VITALS — BP 141/76 | HR 90 | Resp 18 | Ht 69.0 in | Wt 283.0 lb

## 2015-11-22 DIAGNOSIS — Z7982 Long term (current) use of aspirin: Secondary | ICD-10-CM | POA: Insufficient documentation

## 2015-11-22 DIAGNOSIS — I110 Hypertensive heart disease with heart failure: Secondary | ICD-10-CM | POA: Insufficient documentation

## 2015-11-22 DIAGNOSIS — R0602 Shortness of breath: Secondary | ICD-10-CM | POA: Insufficient documentation

## 2015-11-22 DIAGNOSIS — R5383 Other fatigue: Secondary | ICD-10-CM | POA: Insufficient documentation

## 2015-11-22 DIAGNOSIS — Z87891 Personal history of nicotine dependence: Secondary | ICD-10-CM | POA: Insufficient documentation

## 2015-11-22 DIAGNOSIS — Z79899 Other long term (current) drug therapy: Secondary | ICD-10-CM | POA: Insufficient documentation

## 2015-11-22 DIAGNOSIS — E119 Type 2 diabetes mellitus without complications: Secondary | ICD-10-CM | POA: Insufficient documentation

## 2015-11-22 DIAGNOSIS — I10 Essential (primary) hypertension: Secondary | ICD-10-CM

## 2015-11-22 DIAGNOSIS — I5022 Chronic systolic (congestive) heart failure: Secondary | ICD-10-CM

## 2015-11-22 DIAGNOSIS — G4733 Obstructive sleep apnea (adult) (pediatric): Secondary | ICD-10-CM | POA: Insufficient documentation

## 2015-11-22 DIAGNOSIS — I5042 Chronic combined systolic (congestive) and diastolic (congestive) heart failure: Secondary | ICD-10-CM | POA: Insufficient documentation

## 2015-11-22 LAB — BASIC METABOLIC PANEL
Anion gap: 8 (ref 5–15)
BUN: 12 mg/dL (ref 6–20)
CALCIUM: 9.4 mg/dL (ref 8.9–10.3)
CHLORIDE: 105 mmol/L (ref 101–111)
CO2: 25 mmol/L (ref 22–32)
CREATININE: 1.15 mg/dL (ref 0.61–1.24)
GFR calc non Af Amer: >60 mL/min (ref >60)
GLUCOSE: 149 mg/dL — AB (ref 65–99)
Potassium: 3.5 mmol/L (ref 3.5–5.1)
Sodium: 138 mmol/L (ref 135–145)

## 2015-11-22 NOTE — Patient Instructions (Signed)
Continue weighing daily and call for an overnight weight gain of > 2 pounds or a weekly weight gain of >5 pounds. 

## 2015-11-22 NOTE — Telephone Encounter (Signed)
Spoke with patient regarding his lab results that were drawn on 11/22/15. Renal function is normal and potassium is low normal. He's been unable to afford the potassium tablets and is currently taking 50mg  spironolactone daily. Advised him to not worry about buying the potassium tablets and to eat a banana daily. Patient was appreciative of the call back.

## 2015-11-22 NOTE — Progress Notes (Addendum)
Patient ID: Bradley Gironda., male    DOB: [birth year], 50 y.o.   MRN: [id number]  HPI  Bradley Mckenzie is a 50 y/o male who has a history of diabetes, HTN, obstructive sleep apnea, hyperlipidemia, morbid obesity and heart failure with a reduced ejection fraction. Remote tobacco exposure.   Last echo was done 09/02/15 with an EF of 30-35%, mild mitral regurgitation and no aortic stenosis. EF has declined from 40-45% in March 2017 but he had been inconsistent taking his medications & not using his CPAP during this time.  Recently admitted to [facility] 09/01/15 with acute exacerbation of HF and uncontrolled HTN. Was given IV diuretics and assistance with CPAP. Also started on glipizide.  Returns today for a follow-up with improving fatigue and shortness of breath with exertion.No edema in his lower legs/abdomen. Forgot his book at home with weights, BP readings but says that his weight has been stable. Continues to wear his CPAP nightly although feels like his mask isn't fitting well. Is now taking spironolactone 50mg  daily and is still unable to afford the potassium tablets. Now walking 1 mile three times/week.   Past Medical History:  Diagnosis Date  . Cardiomyopathy ([place])    a. 01/2014 Echo: EF 25-30%;  b. ? ischemic vs non-ischemic.  He's never had an ischemic eval.  . Chronic combined systolic (congestive) and diastolic (congestive) heart failure    a. 01/2014 Echo: EF 25-30%. b. Echo 03/2015: Improved EF of 40-45%  . Depression   . Diabetes mellitus without complication ([place])    a. Not previously on outpt meds.  . Hyperlipidemia   . Hypertensive heart disease    a. Since his 33's.  . Morbid obesity ([place])   . Obstructive sleep apnea    a. Has not used CPAP since ~ 2012.    Past Surgical History:  Procedure Laterality Date  . HERNIA REPAIR  2004    Family History  Problem Relation Age of Onset  . Heart disease Mother     alive & well.  [name] Heart attack Father 3    died @ 63 of cancer.  .  Diabetes Father   . Cancer Father   . Cancer Sister     Social History  Substance Use Topics  . Smoking status: Former Research scientist (life sciences)  . Smokeless tobacco: Never Used     Comment: Quit in his 20's.  . Alcohol use 0.0 oz/week     Comment: occassional alcohol use at special events    No Known Allergies  Prior to Admission medications   Medication Sig Start Date End Date Taking? Authorizing Provider  albuterol (PROVENTIL HFA;VENTOLIN HFA) 108 (90 Base) MCG/ACT inhaler Inhale 2 puffs into the lungs every 6 (six) hours as needed for wheezing or shortness of breath. Reported on 07/08/2015 09/25/15  Yes Alisa Graff, FNP  aspirin EC 81 MG tablet Take 1 tablet (81 mg total) by mouth daily. 09/25/15  Yes Alisa Graff, FNP  carvedilol (COREG) 12.5 MG tablet Take 1 tablet (12.5 mg total) by mouth 2 (two) times daily with a meal. 09/25/15  Yes Alisa Graff, FNP  furosemide (LASIX) 40 MG tablet Take 1 tablet (40 mg total) by mouth 2 (two) times daily. 09/25/15  Yes Alisa Graff, FNP  glipiZIDE (GLUCOTROL) 5 MG tablet Take 1 tablet (5 mg total) by mouth daily before breakfast. 09/25/15  Yes Alisa Graff, FNP  hydrALAZINE (APRESOLINE) 25 MG tablet Take 1 tablet (25 mg total) by mouth 2 (  two) times daily. 09/25/15  Yes Alisa Graff, FNP  lisinopril (PRINIVIL,ZESTRIL) 20 MG tablet Take 2 tablets (40 mg total) by mouth daily. 09/26/15  Yes Alisa Graff, FNP  lovastatin (MEVACOR) 20 MG tablet Take 1 tablet (20 mg total) by mouth at bedtime. D/C 40mg  dosage 10/19/15  Yes Alisa Graff, FNP  potassium chloride SA (K-DUR,KLOR-CON) 20 MEQ tablet Take 1 tablet (20 mEq total) by mouth 2 (two) times daily. 09/25/15  Yes Alisa Graff, FNP  spironolactone (ALDACTONE) 25 MG tablet Take 2 tablets (50 mg total) by mouth daily. 11/06/15  Yes Alisa Graff, FNP     Review of Systems  Constitutional: Positive for fatigue ("better"). Negative for appetite change.  HENT: Positive for congestion and postnasal drip.  Negative for sore throat.   Eyes: Negative.   Respiratory: Positive for shortness of breath. Negative for cough and chest tightness.   Cardiovascular: Negative for chest pain, palpitations and leg swelling.  Gastrointestinal: Negative for abdominal distention and abdominal pain.  Endocrine: Negative.   Genitourinary: Negative.   Musculoskeletal: Negative for back pain and neck pain.  Skin: Negative.   Allergic/Immunologic: Negative.   Neurological: Negative for dizziness and light-headedness.  Hematological: Negative for adenopathy. Does not bruise/bleed easily.  Psychiatric/Behavioral: Negative for dysphoric mood and sleep disturbance (wearing CPAP nightly). The patient is not nervous/anxious.    Vitals:   11/22/15 0945  BP: (!) 141/76  Pulse: 90  Resp: 18  SpO2: 98%  Weight: 283 lb (128.4 kg)  Height: 5\' 9"  (1.753 m)    Wt Readings from Last 3 Encounters:  11/22/15 283 lb (128.4 kg)  11/06/15 284 lb (128.8 kg)  10/11/15 282 lb (127.9 kg)   Lab Results  Component Value Date   CREATININE 1.28 (H) 10/11/2015   CREATININE 1.31 (H) 09/13/2015   CREATININE 1.20 09/05/2015      Physical Exam  Constitutional: He is oriented to person, place, and time. He appears well-developed and well-nourished.  HENT:  Head: Normocephalic and atraumatic.  Eyes: Conjunctivae are normal. Pupils are equal, round, and reactive to light.  Neck: Normal range of motion. Neck supple.  Cardiovascular: Normal rate and regular rhythm.   Pulmonary/Chest: Effort normal. He has no wheezes. He has no rales.  Abdominal: Soft. He exhibits no distension. There is no tenderness.  Musculoskeletal: He exhibits no edema or tenderness.  Neurological: He is alert and oriented to person, place, and time.  Skin: Skin is warm and dry.  Psychiatric: He has a normal mood and affect. His behavior is normal. Thought content normal.  Nursing note and vitals reviewed.      Assessment & Plan:  1: Chronic heart  failure with reduced ejection fraction-  - NYHA Class II - euvolemic - Continue weighing daily and call for an overnight weight gain of >2 pounds or a weekly weight gain of >5 pounds - Now taking 50mg  spironolactone daily - Could consider titrating up carvedilol at his next visit.  - Could consider switching his lisinopril to entresto but would have to use the company's foundation assistance as he's currently without any insurance and gets his medications off the $4.00 list at [facility]. He does not want to go through [facility].   2: HTN- - looks great today - Home weight stable - Application turned in to [facility] & still hasn't heard about an appointment. Will have CMA follow-up regarding this.  3: Obstructive sleep apnea- - Reports wearing his CPAP on a  nightly basis - says that his mask isn't fitting as well. Encouraged him to call company to have them come out and check the equipment  4: Diabetes- - Taking glipizide daily - Not checking his glucose as he doesn't have a glucometer. Hoping that [facility] can help him obtain one.  Return in 1 month or sooner for any questions/problems before then. Will check a basic metabolic panel today since spironolactone was increased to 50mg  daily.

## 2015-12-12 ENCOUNTER — Ambulatory Visit: Payer: Self-pay | Admitting: Family Medicine

## 2015-12-12 DIAGNOSIS — M545 Low back pain, unspecified: Secondary | ICD-10-CM | POA: Insufficient documentation

## 2015-12-12 DIAGNOSIS — E119 Type 2 diabetes mellitus without complications: Secondary | ICD-10-CM

## 2015-12-12 DIAGNOSIS — G4733 Obstructive sleep apnea (adult) (pediatric): Secondary | ICD-10-CM

## 2015-12-12 DIAGNOSIS — K029 Dental caries, unspecified: Secondary | ICD-10-CM

## 2015-12-12 DIAGNOSIS — Z5181 Encounter for therapeutic drug level monitoring: Secondary | ICD-10-CM

## 2015-12-12 DIAGNOSIS — E782 Mixed hyperlipidemia: Secondary | ICD-10-CM

## 2015-12-12 DIAGNOSIS — G8929 Other chronic pain: Secondary | ICD-10-CM

## 2015-12-12 DIAGNOSIS — I5022 Chronic systolic (congestive) heart failure: Secondary | ICD-10-CM

## 2015-12-12 MED ORDER — MASKS MISC: 11 refills | Status: DC

## 2015-12-12 NOTE — Assessment & Plan Note (Addendum)
Check fasting lipids; avoid saturated fats when possible

## 2015-12-12 NOTE — Assessment & Plan Note (Signed)
Refer to dental clinic

## 2015-12-12 NOTE — Assessment & Plan Note (Addendum)
Encourage portion control; realize we need to meet patient where he is with what he can afford; encouragement given

## 2015-12-12 NOTE — Assessment & Plan Note (Signed)
Chronic after injury; encouraged weight loss, core strengthening; noted for future referral to PT if needed

## 2015-12-12 NOTE — Assessment & Plan Note (Signed)
Avoid whites; check A1c; refer to diabetic education

## 2015-12-12 NOTE — Patient Instructions (Addendum)
Avoid decongestants Try to follow the DASH guidelines (DASH stands for Dietary Approaches to Stop Hypertension) Try to limit the sodium in your diet.  Ideally, consume less than 1.5 grams (less than 1,500mg ) per day. Do not add salt when cooking or at the table.  Check the sodium amount on labels when shopping, and choose items lower in sodium when given a choice. Avoid or limit foods that already contain a lot of sodium. Eat a diet rich in fruits and vegetables and whole grains. We'll get labs and refer you to a diabetic educator  DASH Eating Plan DASH stands for "Dietary Approaches to Stop Hypertension." The DASH eating plan is a healthy eating plan that has been shown to reduce high blood pressure (hypertension). Additional health benefits may include reducing the risk of type 2 diabetes mellitus, heart disease, and stroke. The DASH eating plan may also help with weight loss. What do I need to know about the DASH eating plan? For the DASH eating plan, you will follow these general guidelines:  Choose foods with less than 150 milligrams of sodium per serving (as listed on the food label).  Use salt-free seasonings or herbs instead of table salt or sea salt.  Check with your health care provider or pharmacist before using salt substitutes.  Eat lower-sodium products. These are often labeled as "low-sodium" or "no salt added."  Eat fresh foods. Avoid eating a lot of canned foods.  Eat more vegetables, fruits, and low-fat dairy products.  Choose whole grains. Look for the word "whole" as the first word in the ingredient list.  Choose fish and skinless chicken or [place] more often than red meat. Limit fish, poultry, and meat to 6 oz (170 g) each day.  Limit sweets, desserts, sugars, and sugary drinks.  Choose heart-healthy fats.  Eat more home-cooked food and less restaurant, buffet, and fast food.  Limit fried foods.  Do not fry foods. Cook foods using methods such as baking,  boiling, grilling, and broiling instead.  When eating at a restaurant, ask that your food be prepared with less salt, or no salt if possible. What foods can I eat? Seek help from a dietitian for individual calorie needs. Grains  Whole grain or whole wheat bread. Brown rice. Whole grain or whole wheat pasta. Quinoa, bulgur, and whole grain cereals. Low-sodium cereals. Corn or whole wheat flour tortillas. Whole grain cornbread. Whole grain crackers. Low-sodium crackers. Vegetables  Fresh or frozen vegetables (raw, steamed, roasted, or grilled). Low-sodium or reduced-sodium tomato and vegetable juices. Low-sodium or reduced-sodium tomato sauce and paste. Low-sodium or reduced-sodium canned vegetables. Fruits  All fresh, canned (in natural juice), or frozen fruits. Meat and Other Protein Products  Ground beef (85% or leaner), grass-fed beef, or beef trimmed of fat. Skinless chicken or [place]. Ground chicken or [place]. Pork trimmed of fat. All fish and seafood. Eggs. Dried beans, peas, or lentils. Unsalted nuts and seeds. Unsalted canned beans. Dairy  Low-fat dairy products, such as skim or 1% milk, 2% or reduced-fat cheeses, low-fat ricotta or cottage cheese, or plain low-fat yogurt. Low-sodium or reduced-sodium cheeses. Fats and Oils  Tub margarines without trans fats. Light or reduced-fat mayonnaise and salad dressings (reduced sodium). Avocado. Safflower, olive, or canola oils. Natural peanut or almond butter. Other  Unsalted popcorn and pretzels. The items listed above may not be a complete list of recommended foods or beverages. Contact your dietitian for more options.  What foods are not recommended? Grains  White bread. White pasta. White  rice. Refined cornbread. Bagels and croissants. Crackers that contain trans fat. Vegetables  Creamed or fried vegetables. Vegetables in a cheese sauce. Regular canned vegetables. Regular canned tomato sauce and paste. Regular tomato and vegetable  juices. Fruits  Canned fruit in light or heavy syrup. Fruit juice. Meat and Other Protein Products  Fatty cuts of meat. Ribs, chicken wings, bacon, sausage, bologna, salami, chitterlings, fatback, hot dogs, bratwurst, and packaged luncheon meats. Salted nuts and seeds. Canned beans with salt. Dairy  Whole or 2% milk, cream, half-and-half, and cream cheese. Whole-fat or sweetened yogurt. Full-fat cheeses or blue cheese. Nondairy creamers and whipped toppings. Processed cheese, cheese spreads, or cheese curds. Condiments  Onion and garlic salt, seasoned salt, table salt, and sea salt. Canned and packaged gravies. Worcestershire sauce. Tartar sauce. Barbecue sauce. Teriyaki sauce. Soy sauce, including reduced sodium. Steak sauce. Fish sauce. Oyster sauce. Cocktail sauce. Horseradish. Ketchup and mustard. Meat flavorings and tenderizers. Bouillon cubes. Hot sauce. Tabasco sauce. Marinades. Taco seasonings. Relishes. Fats and Oils  Butter, stick margarine, lard, shortening, ghee, and bacon fat. Coconut, palm kernel, or palm oils. Regular salad dressings. Other  Pickles and olives. Salted popcorn and pretzels. The items listed above may not be a complete list of foods and beverages to avoid. Contact your dietitian for more information.  Where can I find more information? National Heart, Lung, and Blood Institute: travelstabloid.com This information is not intended to replace advice given to you by your health care provider. Make sure you discuss any questions you have with your health care provider. Document Released: 12/20/2010 Document Revised: 06/08/2015 Document Reviewed: 11/04/2012 Elsevier Interactive Patient Education  2017 [employer].

## 2015-12-12 NOTE — Assessment & Plan Note (Addendum)
CPAP started 2009 or 2010; he needs new equipment; I tried to order in medicine section and will ask him to f/u with staff to see that it gets ordered

## 2015-12-12 NOTE — Assessment & Plan Note (Signed)
Last EF about 30-35%; avoid salt; praised patient for weighing himself daily; keep f/u with cardiologist

## 2015-12-12 NOTE — Assessment & Plan Note (Signed)
Monitor liver, kidneys
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
## 2015-12-13 LAB — COMPREHENSIVE METABOLIC PANEL
ALBUMIN: 4.3 g/dL (ref 3.5–5.5)
ALK PHOS: 121 IU/L — AB (ref 39–117)
ALT: 25 IU/L (ref 0–44)
AST: 23 IU/L (ref 0–40)
Albumin/Globulin Ratio: 1.1 — ABNORMAL LOW (ref 1.2–2.2)
BUN / CREAT RATIO: 13 (ref 9–20)
BUN: 16 mg/dL (ref 6–24)
CHLORIDE: 98 mmol/L (ref 96–106)
CO2: 23 mmol/L (ref 18–29)
CREATININE: 1.27 mg/dL (ref 0.76–1.27)
Calcium: 9.7 mg/dL (ref 8.7–10.2)
GFR calc Af Amer: 77 mL/min/{1.73_m2} (ref >59)
GFR calc non Af Amer: 66 mL/min/{1.73_m2} (ref >59)
GLUCOSE: 140 mg/dL — AB (ref 65–99)
Globulin, Total: 3.9 g/dL (ref 1.5–4.5)
Potassium: 3.9 mmol/L (ref 3.5–5.2)
Sodium: 141 mmol/L (ref 134–144)
Total Protein: 8.2 g/dL (ref 6.0–8.5)

## 2015-12-13 LAB — HEMOGLOBIN A1C
ESTIMATED AVERAGE GLUCOSE: 183 mg/dL
Hgb A1c MFr Bld: 8 % — ABNORMAL HIGH (ref 4.8–5.6)

## 2015-12-13 LAB — MICROALBUMIN / CREATININE URINE RATIO
Creatinine, Urine: 179 mg/dL
Microalb/Creat Ratio: 4.9 mg/g creat (ref 0.0–30.0)
Microalbumin, Urine: 8.8 ug/mL

## 2015-12-13 LAB — LIPID PANEL
CHOLESTEROL TOTAL: 149 mg/dL (ref 100–199)
Chol/HDL Ratio: 3.5 ratio units (ref 0.0–5.0)
HDL: 43 mg/dL (ref >39)
LDL CALC: 81 mg/dL (ref 0–99)
TRIGLYCERIDES: 126 mg/dL (ref 0–149)
VLDL CHOLESTEROL CAL: 25 mg/dL (ref 5–40)

## 2015-12-14 ENCOUNTER — Other Ambulatory Visit: Payer: Self-pay | Admitting: Adult Health Nurse Practitioner

## 2015-12-14 MED ORDER — GLIPIZIDE 10 MG PO TABS: 10.0000 mg | ORAL_TABLET | Freq: Every day | ORAL | 3 refills | Status: DC

## 2015-12-15 ENCOUNTER — Other Ambulatory Visit: Payer: Self-pay

## 2015-12-15 DIAGNOSIS — E118 Type 2 diabetes mellitus with unspecified complications: Secondary | ICD-10-CM

## 2015-12-15 MED ORDER — GLIPIZIDE 10 MG PO TABS: 10.0000 mg | ORAL_TABLET | Freq: Every day | ORAL | 3 refills | Status: DC

## 2015-12-15 NOTE — Telephone Encounter (Addendum)
RX was supposed to go to [facility] [address]. Sent it there. Also spoke with pt and gave results from doctor about labs and new RX he should be taking. PT verbalized understanding.

## 2015-12-18 ENCOUNTER — Telehealth: Payer: Self-pay

## 2015-12-18 MED ORDER — METOLAZONE 2.5 MG PO TABS
2.5000 mg | ORAL_TABLET | Freq: Every day | ORAL | 0 refills | Status: DC
Start: 2015-12-18 — End: 2015-12-21

## 2015-12-18 NOTE — Telephone Encounter (Signed)
Pt called states he has noticed a weight gain. He was 281 lb on Saturday and today he is at 286 lb. He has noticed increase in fatigue denies shortness of breath and chest pain. He also denies any edema. Was seen [facility] and they did increase his glipizied to 10 mg daily from 5 mg.

## 2015-12-18 NOTE — Telephone Encounter (Signed)
Spoke with patient who says that he's gained 5 pounds since 12/16/15. He also says that he's been feeling a little "sluggish" over the last couple of weeks. Denies any shortness of breath and doesn't feel like he's more swollen. Doesn't think he's had any additional salt that he's aware of and isn't drinking any more fluids than normal.  Has had glipizide recently increased by his PCP at [facility].  Patient currently taking 50mg  spironolactone daily and furosemide 40mg  twice daily.   Will send in 2.5mg  metolazone for the next 2 days. Advised him to take it 1/2 hour prior to furosemide dose. Should his weight come down quickly with just 1 dose, he does not need to take the 2nd dose. Patient verbalized understanding. Already has a follow-up appointment scheduled for 12/21/15. Prescription sent in to [facility] [address] per his request.

## 2015-12-21 ENCOUNTER — Ambulatory Visit: Payer: Self-pay | Attending: Family | Admitting: Family

## 2015-12-21 ENCOUNTER — Encounter: Payer: Self-pay | Admitting: Family

## 2015-12-21 ENCOUNTER — Ambulatory Visit: Payer: Self-pay | Admitting: Family

## 2015-12-21 VITALS — BP 116/64 | HR 96 | Resp 18 | Ht 69.0 in | Wt 279.0 lb

## 2015-12-21 DIAGNOSIS — E119 Type 2 diabetes mellitus without complications: Secondary | ICD-10-CM | POA: Insufficient documentation

## 2015-12-21 DIAGNOSIS — Z5189 Encounter for other specified aftercare: Secondary | ICD-10-CM | POA: Insufficient documentation

## 2015-12-21 DIAGNOSIS — Z87891 Personal history of nicotine dependence: Secondary | ICD-10-CM | POA: Insufficient documentation

## 2015-12-21 DIAGNOSIS — I5022 Chronic systolic (congestive) heart failure: Secondary | ICD-10-CM

## 2015-12-21 DIAGNOSIS — Z7982 Long term (current) use of aspirin: Secondary | ICD-10-CM | POA: Insufficient documentation

## 2015-12-21 DIAGNOSIS — R0602 Shortness of breath: Secondary | ICD-10-CM | POA: Insufficient documentation

## 2015-12-21 DIAGNOSIS — N62 Hypertrophy of breast: Secondary | ICD-10-CM | POA: Insufficient documentation

## 2015-12-21 DIAGNOSIS — I10 Essential (primary) hypertension: Secondary | ICD-10-CM

## 2015-12-21 DIAGNOSIS — I5042 Chronic combined systolic (congestive) and diastolic (congestive) heart failure: Secondary | ICD-10-CM | POA: Insufficient documentation

## 2015-12-21 DIAGNOSIS — G4733 Obstructive sleep apnea (adult) (pediatric): Secondary | ICD-10-CM | POA: Insufficient documentation

## 2015-12-21 DIAGNOSIS — I110 Hypertensive heart disease with heart failure: Secondary | ICD-10-CM | POA: Insufficient documentation

## 2015-12-21 DIAGNOSIS — R5383 Other fatigue: Secondary | ICD-10-CM | POA: Insufficient documentation

## 2015-12-21 DIAGNOSIS — Z79899 Other long term (current) drug therapy: Secondary | ICD-10-CM | POA: Insufficient documentation

## 2015-12-21 NOTE — Patient Instructions (Signed)
Continue weighing daily and call for an overnight weight gain of > 2 pounds or a weekly weight gain of >5 pounds. 

## 2015-12-21 NOTE — Progress Notes (Signed)
Patient ID: Bradley Rossell., male    DOB: 15-Dec-1965, 50 y.o.   MRN: [id number]  HPI  [name] is a 50 y/o male who has a history of diabetes, HTN, obstructive sleep apnea, hyperlipidemia, morbid obesity and heart failure with a reduced ejection fraction. Remote tobacco exposure.   Last echo was done 09/02/15 with an EF of 30-35%, mild mitral regurgitation and no aortic stenosis. EF has declined from 40-45% in March 2017 but he had been inconsistent taking his medications & not using his CPAP during this time.  Last admitted to [facility] 09/01/15 with acute exacerbation of HF and uncontrolled HTN. Was given IV diuretics and assistance with CPAP. Also started on glipizide.  Returns today for a follow-up with a mild amount of fatigue and shortness of breath with exertion.Does report feeling sluggish over the last few weeks. No edema in his lower legs/abdomen. Forgot his book at home with weights, BP readings but says that his weight has been stable. Continues to wear his CPAP nightly although feels like his mask isn't fitting well. Took one dose of metolazone with good results. Did not take the 2nd dose.  Past Medical History:  Diagnosis Date  . Cardiomyopathy ([place])    a. 01/2014 Echo: EF 25-30%;  b. ? ischemic vs non-ischemic.  He's never had an ischemic eval.  . Chronic combined systolic (congestive) and diastolic (congestive) heart failure    a. 01/2014 Echo: EF 25-30%. b. Echo 03/2015: Improved EF of 40-45%  . Depression   . Diabetes mellitus without complication ([place])    a. Not previously on outpt meds.  . Hyperlipidemia   . Hypertensive heart disease    a. Since his 8's.  . Morbid obesity ([place])   . Obstructive sleep apnea    a. Has not used CPAP since ~ 2012.   Past Surgical History:  Procedure Laterality Date  . HERNIA REPAIR  2004   Family History  Problem Relation Age of Onset  . Heart disease Mother     alive & well.  [name] Heart attack Father 41    died @ 25 of cancer.  .  Diabetes Father   . Cancer Father     "in his stomach"  . Cancer Sister   . Cancer Paternal Uncle    Social History  Substance Use Topics  . Smoking status: Former Research scientist (life sciences)  . Smokeless tobacco: Never Used     Comment: Quit in his 20's.  . Alcohol use 0.0 oz/week     Comment: occassional alcohol use at special events   No Known Allergies  Prior to Admission medications   Medication Sig Start Date End Date Taking? Authorizing Provider  albuterol (PROVENTIL HFA;VENTOLIN HFA) 108 (90 Base) MCG/ACT inhaler Inhale 2 puffs into the lungs every 6 (six) hours as needed for wheezing or shortness of breath. Reported on 07/08/2015 09/25/15  Yes Alisa Graff, FNP  aspirin EC 81 MG tablet Take 1 tablet (81 mg total) by mouth daily. 09/25/15  Yes Alisa Graff, FNP  carvedilol (COREG) 12.5 MG tablet Take 1 tablet (12.5 mg total) by mouth 2 (two) times daily with a meal. 09/25/15  Yes Alisa Graff, FNP  furosemide (LASIX) 40 MG tablet Take 1 tablet (40 mg total) by mouth 2 (two) times daily. 09/25/15  Yes Alisa Graff, FNP  glipiZIDE (GLUCOTROL) 10 MG tablet Take 1 tablet (10 mg total) by mouth daily before breakfast. 12/15/15  Yes Teah Doles-Johnson, NP  hydrALAZINE (APRESOLINE) 25 MG  tablet Take 1 tablet (25 mg total) by mouth 2 (two) times daily. 09/25/15  Yes Alisa Graff, FNP  lisinopril (PRINIVIL,ZESTRIL) 20 MG tablet Take 2 tablets (40 mg total) by mouth daily. 09/26/15  Yes Alisa Graff, FNP  lovastatin (MEVACOR) 20 MG tablet Take 1 tablet (20 mg total) by mouth at bedtime. D/C 40mg  dosage 10/19/15  Yes Alisa Graff, Avon Grubbs Patient needs new mask and replacement tubing for CPAP; he has a machine 12/12/15  Yes Arnetha Courser, MD  spironolactone (ALDACTONE) 25 MG tablet Take 2 tablets (50 mg total) by mouth daily. 11/06/15  Yes Alisa Graff, FNP    Review of Systems  Constitutional: Positive for fatigue. Negative for appetite change.  HENT: Positive for rhinorrhea. Negative for  congestion and sore throat.   Eyes: Negative.   Respiratory: Positive for shortness of breath (mild ). Negative for chest tightness.   Cardiovascular: Negative for chest pain, palpitations and leg swelling.  Gastrointestinal: Negative for abdominal distention and abdominal pain.  Endocrine: Negative.        Breast tenderness  Genitourinary: Negative.   Musculoskeletal: Negative.   Skin: Negative.   Allergic/Immunologic: Negative.   Neurological: Negative for dizziness and light-headedness.  Hematological: Negative for adenopathy. Does not bruise/bleed easily.  Psychiatric/Behavioral: Negative for dysphoric mood and sleep disturbance (sleeping well with CPAP). The patient is not nervous/anxious.    Vitals:   12/21/15 1309  BP: 116/64  Pulse: 96  Resp: 18  SpO2: 98%  Weight: 279 lb (126.6 kg)  Height: 5\' 9"  (1.753 m)   Wt Readings from Last 3 Encounters:  12/21/15 279 lb (126.6 kg)  12/12/15 258 lb (117 kg)  11/22/15 283 lb (128.4 kg)   Lab Results  Component Value Date   CREATININE 1.27 12/12/2015   CREATININE 1.15 11/22/2015   CREATININE 1.28 (H) 10/11/2015    Physical Exam  Constitutional: He is oriented to person, place, and time. He appears well-developed and well-nourished.  HENT:  Head: Normocephalic and atraumatic.  Eyes: Conjunctivae are normal. Pupils are equal, round, and reactive to light.  Neck: Normal range of motion. Neck supple. No JVD present.  Cardiovascular: Regular rhythm.  Tachycardia present.   Pulmonary/Chest: Effort normal. He has no wheezes. He has no rales.  Abdominal: Soft. He exhibits no distension. There is no tenderness.  Musculoskeletal: He exhibits no edema or tenderness.  Neurological: He is alert and oriented to person, place, and time.  Skin: Skin is warm and dry.  Psychiatric: He has a normal mood and affect. His behavior is normal. Thought content normal.  Nursing note and vitals reviewed.   Assessment & Plan:  1: Chronic heart  failure with reduced ejection fraction-  - NYHA Class II - euvolemic - Continue weighing daily and call for an overnight weight gain of >2 pounds or a weekly weight gain of >5 pounds. Weight at [facility] on 12/12/15 was listed as 258 pounds but unlikely that weight is correct.  - Could consider titrating up carvedilol in the future - Could consider switching his lisinopril to entresto but would have to use the company's foundation assistance as he's currently without any insurance and gets his medications off the $4.00 list at [facility]. He does not want to go through [facility].   2: HTN- - looks great today - Home weight stable - Was seen at [facility] on 12/12/15 and returns to them March 2018  3: Obstructive sleep apnea- -  Reports wearing his CPAP on a nightly basis - says that his mask isn't fitting as well. PCP is supposed to be helping in getting his equipment looked at. With the mask leaking, this could be contributing to his sluggish feeling that he's been having - instructed him to call them next week if he hasn't heard anything from them  4: Diabetes- - Taking glipizide daily - A1c is now 8% and glipizide has been increased by PCP  5: Gynecomastia- - reports breast tenderness and slight enlargement - discussed that this is most likely related to spironolactone dose.  - could switch to epleronone except for cost as he's uninsured - patient says that symptoms are tolerable right now. He is to let me know if he'd like to try stopping the medication with the understanding that we'd have to watch his fluid status carefully.  Return here in 3 months or sooner for any questions/problems before then.

## 2015-12-22 DIAGNOSIS — N62 Hypertrophy of breast: Secondary | ICD-10-CM | POA: Insufficient documentation

## 2015-12-27 ENCOUNTER — Telehealth: Payer: Self-pay | Admitting: Family

## 2015-12-27 NOTE — Telephone Encounter (Signed)
Patient called to say that his appetite has increased greatly since his PCP increased his glipizide. Not checking his glucose levels at home as he doesn't have a glucometer. Advised him that he needed to call his PCP at [facility] to discuss this with them. Patient verbalized understanding.

## 2016-01-02 ENCOUNTER — Ambulatory Visit: Payer: Self-pay | Admitting: Dietician

## 2016-01-15 DIAGNOSIS — E119 Type 2 diabetes mellitus without complications: Secondary | ICD-10-CM

## 2016-01-15 HISTORY — DX: Type 2 diabetes mellitus without complications: E11.9

## 2016-02-08 ENCOUNTER — Telehealth: Payer: Self-pay

## 2016-02-08 NOTE — Telephone Encounter (Signed)
Spoke with patient who says that he's had an overnight weight gain of 5 pounds. Feels like his abdomen is tighter as well but doesn't feel like he's any more short of breath than usual. Is coughing some but he's got a head cold/sinus congestion going on.  He says that he has 1 tablet left of metolazone 2.5mg  at home that he didn't take from a previous time. Advised him to go ahead and take that one dose today and call me back tomorrow to let me know how his weight is.  If he doesn't respond well to the one dose, he may need a couple additional doses. Patient verbalized understanding and said that he'll take the metolazone today and call back tomorrow.

## 2016-02-08 NOTE — Telephone Encounter (Signed)
Pt contacted the office with an overnight weight gain of 5 lbs. He is have been coughing a lot lately but denies any shortness of breath. He has had the cough for some time about 3 weeks, but he has been dealing with some sinus issues so had contributed the cough to that. He does admit that his stomach does feel tighter, but doesn't really see any edema in his ankles. Please advise.

## 2016-02-09 ENCOUNTER — Telehealth: Payer: Self-pay | Admitting: Family

## 2016-02-09 MED ORDER — METOLAZONE 2.5 MG PO TABS: 2.5000 mg | ORAL_TABLET | Freq: Every day | ORAL | 0 refills | Status: DC

## 2016-02-09 NOTE — Telephone Encounter (Signed)
Patient called to say that he couldn't find his last tablet of metolazone 2.5mg  so needs a prescription called in. Weight is still 289 pounds. Will call in 3 tablets of metolazone 2.5mg  and advised him that if he responds well to 1 tablet, then he doesn't have to take all 3 tablets. Also asked him to call us back on 02/12/16 to update status.  Patient verbalized understanding of above.

## 2016-02-13 ENCOUNTER — Telehealth: Payer: Self-pay

## 2016-02-13 NOTE — Telephone Encounter (Signed)
Pt called to report that he was doing well. His weight is down since starting the metolazone. He states he weighs 284 today when he weighed. He did mention that he is still not exercising like he should but his back has really been bothering him. He is [facility].

## 2016-02-25 ENCOUNTER — Other Ambulatory Visit: Payer: Self-pay | Admitting: Family

## 2016-03-14 ENCOUNTER — Ambulatory Visit: Payer: Self-pay | Admitting: Nurse Practitioner

## 2016-03-14 VITALS — BP 123/74 | Temp 98.7°F | Wt 285.7 lb

## 2016-03-14 DIAGNOSIS — I10 Essential (primary) hypertension: Secondary | ICD-10-CM

## 2016-03-14 DIAGNOSIS — E782 Mixed hyperlipidemia: Secondary | ICD-10-CM

## 2016-03-14 DIAGNOSIS — E119 Type 2 diabetes mellitus without complications: Secondary | ICD-10-CM

## 2016-03-14 DIAGNOSIS — R5382 Chronic fatigue, unspecified: Secondary | ICD-10-CM

## 2016-03-14 LAB — GLUCOSE, POCT (MANUAL RESULT ENTRY): POC GLUCOSE: 83 mg/dL (ref 70–99)

## 2016-03-14 MED ORDER — BUDESONIDE-FORMOTEROL FUMARATE 80-4.5 MCG/ACT IN AERO: 2.0000 | INHALATION_SPRAY | Freq: Two times a day (BID) | RESPIRATORY_TRACT | 6 refills | Status: DC

## 2016-03-14 NOTE — Addendum Note (Signed)
Addended by: Boyce Medici on: 03/14/2016 08:39 PM   Modules accepted: Orders

## 2016-03-14 NOTE — Progress Notes (Signed)
Diabetic , diagnosed 1 year ago Diagnosed with CHF 2 years ago  POSITIVE FOR ASTHMA, RECENTLY DIAGNOSED WITH COPD SHORTNESS OF BREATH DEPENDS ON ACTIVITY HAS BEEN EXPOSED TO CIGARETTE SMOKE, INCLUDING IMMEDIATE FAMILY  DOES NOT NEED REFILLS  HURT BACK AGE 51, SINCE November 2017, NOW WITH ONGOING LOWER BACK PAIN, RADIATING DOWN BOTH LEGS, UNABLE TO DO ANY EXTENSIVE WALKING  HAS VERY LIMITED FINANCES FOR FOODS  STATES HIS GIRLFRIEND COOKS MEAL EVERYDAY  NEEDS METER AND STRIPS   EYE EXAM APPROX 1 YEAR AGO, WAS TOLD HE HAD GLAUCOMA.    EXAM:  ALERT VERBALLY APPROPRIATE, IN NO APPARENT DISTRESS  NO THYROMEGALY, NO PALPABLE ADENOPATHY  NO CAROTID BRUITS  BBrS CLEAR, THOUGH SLIGHTLY DIMINISHED,  AP RRR  BLE WITH TRACE EDEMA, PALPABLEW POSTERIOR TIBIAL PULSES  PALPABLE POSTERIOR TIBIAL PULSES BILATERALLY.  PLAN:  WILL ADD SYMBICORT FOR HIS COPD REGIMEN  LOWER BACK PAIN, WILL HAVE PT EVAL'ED BY ORTHO,   EYES, WILL HAVE PT SCHEDULED FOR EYE EXAM.    NEEDS A NEW GLUCOSE METER AND STRIPS

## 2016-03-15 LAB — COMPREHENSIVE METABOLIC PANEL
ALT: 21 IU/L (ref 0–44)
AST: 17 IU/L (ref 0–40)
Albumin/Globulin Ratio: 1.2 (ref 1.2–2.2)
Albumin: 4.5 g/dL (ref 3.5–5.5)
Alkaline Phosphatase: 93 IU/L (ref 39–117)
BUN/Creatinine Ratio: 12 (ref 9–20)
BUN: 17 mg/dL (ref 6–24)
Bilirubin Total: <0.2 mg/dL (ref 0.0–1.2)
CO2: 23 mmol/L (ref 18–29)
CREATININE: 1.39 mg/dL — AB (ref 0.76–1.27)
Calcium: 9.8 mg/dL (ref 8.7–10.2)
Chloride: 101 mmol/L (ref 96–106)
GFR, EST AFRICAN AMERICAN: 68 mL/min/{1.73_m2} (ref >59)
GFR, EST NON AFRICAN AMERICAN: 59 mL/min/{1.73_m2} — AB (ref >59)
GLOBULIN, TOTAL: 3.7 g/dL (ref 1.5–4.5)
GLUCOSE: 77 mg/dL (ref 65–99)
Potassium: 4.2 mmol/L (ref 3.5–5.2)
Sodium: 142 mmol/L (ref 134–144)
TOTAL PROTEIN: 8.2 g/dL (ref 6.0–8.5)

## 2016-03-15 LAB — TSH: TSH: 1.3 u[IU]/mL (ref 0.450–4.500)

## 2016-03-15 LAB — LIPID PANEL
CHOL/HDL RATIO: 3.4 ratio (ref 0.0–5.0)
Cholesterol, Total: 153 mg/dL (ref 100–199)
HDL: 45 mg/dL (ref >39)
LDL CALC: 89 mg/dL (ref 0–99)
Triglycerides: 93 mg/dL (ref 0–149)
VLDL CHOLESTEROL CAL: 19 mg/dL (ref 5–40)

## 2016-03-15 LAB — HEMOGLOBIN A1C
Est. average glucose Bld gHb Est-mCnc: 174 mg/dL
HEMOGLOBIN A1C: 7.7 % — AB (ref 4.8–5.6)

## 2016-03-20 ENCOUNTER — Ambulatory Visit: Payer: Self-pay | Admitting: Specialist

## 2016-03-20 ENCOUNTER — Other Ambulatory Visit: Payer: Self-pay | Admitting: Family

## 2016-03-20 ENCOUNTER — Other Ambulatory Visit: Payer: Self-pay | Admitting: Specialist

## 2016-03-20 DIAGNOSIS — M545 Low back pain: Secondary | ICD-10-CM
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
## 2016-03-21 ENCOUNTER — Ambulatory Visit: Payer: Self-pay | Attending: Family | Admitting: Family

## 2016-03-21 ENCOUNTER — Encounter: Payer: Self-pay | Admitting: Family

## 2016-03-21 VITALS — BP 137/67 | HR 94 | Resp 20 | Ht 69.0 in | Wt 291.0 lb

## 2016-03-21 DIAGNOSIS — I110 Hypertensive heart disease with heart failure: Secondary | ICD-10-CM | POA: Insufficient documentation

## 2016-03-21 DIAGNOSIS — I5021 Acute systolic (congestive) heart failure: Secondary | ICD-10-CM

## 2016-03-21 DIAGNOSIS — Z7984 Long term (current) use of oral hypoglycemic drugs: Secondary | ICD-10-CM | POA: Insufficient documentation

## 2016-03-21 DIAGNOSIS — Z79899 Other long term (current) drug therapy: Secondary | ICD-10-CM | POA: Insufficient documentation

## 2016-03-21 DIAGNOSIS — G4733 Obstructive sleep apnea (adult) (pediatric): Secondary | ICD-10-CM

## 2016-03-21 DIAGNOSIS — Z87891 Personal history of nicotine dependence: Secondary | ICD-10-CM | POA: Insufficient documentation

## 2016-03-21 DIAGNOSIS — I5042 Chronic combined systolic (congestive) and diastolic (congestive) heart failure: Secondary | ICD-10-CM | POA: Insufficient documentation

## 2016-03-21 DIAGNOSIS — I10 Essential (primary) hypertension: Secondary | ICD-10-CM

## 2016-03-21 DIAGNOSIS — Z7982 Long term (current) use of aspirin: Secondary | ICD-10-CM | POA: Insufficient documentation

## 2016-03-21 DIAGNOSIS — E785 Hyperlipidemia, unspecified: Secondary | ICD-10-CM | POA: Insufficient documentation

## 2016-03-21 DIAGNOSIS — F329 Major depressive disorder, single episode, unspecified: Secondary | ICD-10-CM | POA: Insufficient documentation

## 2016-03-21 DIAGNOSIS — E119 Type 2 diabetes mellitus without complications: Secondary | ICD-10-CM

## 2016-03-21 NOTE — Patient Instructions (Signed)
Continue weighing daily and call for an overnight weight gain of > 2 pounds or a weekly weight gain of >5 pounds. 

## 2016-03-21 NOTE — Progress Notes (Signed)
Patient ID: Bradley Lipscomb., male    DOB: Nov 28, 1965, 51 y.o.   MRN: [id number]  HPI Bradley Mckenzie is a 51 y/o male who has a history of diabetes, HTN, obstructive sleep apnea, depression, hyperlipidemia, morbid obesity and heart failure with a reduced ejection fraction. Remote tobacco exposure.   Last echo was done 09/02/15 with an EF of 30-35%, mild mitral regurgitation and no aortic stenosis. EF has declined from 40-45% in March 2017 but he had been inconsistent taking his medications & not using his CPAP during this time.  Last admitted to [facility] 09/01/15 with acute exacerbation of HF and uncontrolled HTN. Was given IV diuretics and assistance with CPAP. Also started on glipizide.  Returns today for a follow-up with a mild amount of fatigue and shortness of breath with exertion. Denies having any edema in legs/abdomen. Says that his weight has gradually gone up but that he has gained 3 pounds overnight last night. Weighing 285 pounds at home. Having some chronic back pain and is being worked up by Bradley Mckenzie. Having difficulty sleeping due to CPAP mask leaking.   Past Medical History:  Diagnosis Date  . Cardiomyopathy ([place])    a. 01/2014 Echo: EF 25-30%;  b. ? ischemic vs non-ischemic.  He's never had an ischemic eval.  . Chronic combined systolic (congestive) and diastolic (congestive) heart failure    a. 01/2014 Echo: EF 25-30%. b. Echo 03/2015: Improved EF of 40-45%  . Depression   . Diabetes mellitus without complication ([place])    a. Not previously on outpt meds.  . Hyperlipidemia   . Hypertensive heart disease    a. Since his 50's.  . Morbid obesity ([place])   . Obstructive sleep apnea    a. Has not used CPAP since ~ 2012.   Past Surgical History:  Procedure Laterality Date  . HERNIA REPAIR  2004   Family History  Problem Relation Age of Onset  . Heart disease Mother     alive & well.  [name] Heart attack Father 17    died @ 59 of cancer.  . Diabetes Father   . Cancer Father     "in his stomach"  . Cancer Sister   . Cancer Paternal Uncle    Social History  Substance Use Topics  . Smoking status: Former Research scientist (life sciences)  . Smokeless tobacco: Never Used     Comment: Quit in his 20's.  . Alcohol use 0.0 oz/week     Comment: occassional alcohol use at special events   No Known Allergies  Prior to Admission medications   Medication Sig Start Date End Date Taking? Authorizing Provider  albuterol (PROVENTIL HFA;VENTOLIN HFA) 108 (90 Base) MCG/ACT inhaler Inhale 2 puffs into the lungs every 6 (six) hours as needed for wheezing or shortness of breath. Reported on 07/08/2015 09/25/15  Yes Alisa Graff, FNP  aspirin EC 81 MG tablet Take 1 tablet (81 mg total) by mouth daily. 09/25/15  Yes Alisa Graff, FNP  carvedilol (COREG) 12.5 MG tablet TAKE ONE TABLET BY MOUTH TWICE DAILY WITH A MEAL 02/25/16  Yes Alisa Graff, FNP  furosemide (LASIX) 40 MG tablet Take 1 tablet (40 mg total) by mouth 2 (two) times daily. 09/25/15  Yes Alisa Graff, FNP  glipiZIDE (GLUCOTROL) 10 MG tablet Take 1 tablet (10 mg total) by mouth daily before breakfast. 12/15/15  Yes Teah Doles-Johnson, NP  hydrALAZINE (APRESOLINE) 25 MG tablet Take 1 tablet (25 mg total) by mouth 2 (two) times daily.  09/25/15  Yes Alisa Graff, FNP  lisinopril (PRINIVIL,ZESTRIL) 20 MG tablet TAKE TWO TABLETS BY MOUTH ONCE DAILY 03/20/16  Yes Alisa Graff, FNP  lovastatin (MEVACOR) 20 MG tablet Take 1 tablet (20 mg total) by mouth at bedtime. D/C 40mg  dosage 10/19/15  Yes Alisa Graff, Kyle Smolan Patient needs new mask and replacement tubing for CPAP; he has a machine 12/12/15  Yes Arnetha Courser, MD  spironolactone (ALDACTONE) 25 MG tablet Take 2 tablets (50 mg total) by mouth daily. 11/06/15  Yes Alisa Graff, FNP    Review of Systems  Constitutional: Positive for fatigue. Negative for appetite change.  HENT: Positive for congestion. Negative for postnasal drip and sore throat.   Eyes: Negative.   Respiratory:  Positive for shortness of breath. Negative for chest tightness.   Cardiovascular: Negative for chest pain, palpitations and leg swelling.  Gastrointestinal: Positive for abdominal distention. Negative for abdominal pain.  Endocrine: Negative.   Genitourinary: Negative.   Musculoskeletal: Positive for back pain. Negative for neck pain.  Skin: Negative.   Allergic/Immunologic: Negative.   Neurological: Negative for dizziness and light-headedness.  Hematological: Negative for adenopathy. Does not bruise/bleed easily.  Psychiatric/Behavioral: Positive for sleep disturbance (not sleeping well due to leaky mask). Negative for dysphoric mood. The patient is not nervous/anxious.    Vitals:   03/21/16 1006  BP: 137/67  Pulse: 94  Resp: 20  SpO2: 97%  Weight: 291 lb (132 kg)  Height: 5\' 9"  (1.753 m)   Wt Readings from Last 3 Encounters:  03/21/16 291 lb (132 kg)  03/14/16 285 lb 11.2 oz (129.6 kg)  12/21/15 279 lb (126.6 kg)   Lab Results  Component Value Date   CREATININE 1.39 (H) 03/14/2016   CREATININE 1.27 12/12/2015   CREATININE 1.15 11/22/2015   Physical Exam  Constitutional: He is oriented to person, place, and time. He appears well-developed and well-nourished.  HENT:  Head: Normocephalic and atraumatic.  Eyes: Conjunctivae are normal. Pupils are equal, round, and reactive to light.  Neck: Normal range of motion. Neck supple. JVD present.  Cardiovascular: Normal rate and regular rhythm.   Pulmonary/Chest: Effort normal. He has no wheezes. He has no rales.  Abdominal: Soft. He exhibits distension. There is no tenderness.  Musculoskeletal: He exhibits no edema or tenderness.  Neurological: He is alert and oriented to person, place, and time.  Skin: Skin is warm and dry.  Psychiatric: He has a normal mood and affect. His behavior is normal. Thought content normal.  Nursing note and vitals reviewed.   Assessment & Plan:  1: Acute heart failure with reduced ejection  fraction-  - NYHA Class II - mildly fluid overloaded with increased weight - weight by our scale is up 12 pounds from when he was last seen here in December 2017. He says that he's been weighing daily and his weight has gone from 282 pounds to 285 pounds. He says that he has one dose left of metolazone and says that he'll take it tomorrow.  - Could consider titrating up carvedilol in the future - Could consider switching his lisinopril to entresto but would have to use the company's foundation assistance as he's currently without any insurance and gets his medications off the $4.00 list at [facility]. He does not want to go through [facility].   2: HTN- - looks great today - Returns to [facility] on 04/11/16  3: Obstructive sleep apnea- - Reports trying to wear his  CPAP on a nightly basis - says that his mask is leaking and no longer fits correctly.  - will see if home health can assist in evaluating his equipment. He says that he's using the mask/hose that he had used when he was in the hospital this last time but that his machine is from years ago. - remains uninsured  4: Diabetes- - Taking glipizide daily - A1c is now 8% and glipizide has been increased by PCP - glucose running in the 130's  Patient did not bring his medications nor a list. Each medication was verbally reviewed with the patient and he was encouraged to bring the bottles to every visit to confirm accuracy of list.  Return in 1 month or sooner for any questions/problems before then.

## 2016-03-22 DIAGNOSIS — I5021 Acute systolic (congestive) heart failure: Secondary | ICD-10-CM | POA: Insufficient documentation

## 2016-03-25 ENCOUNTER — Other Ambulatory Visit: Payer: Self-pay | Admitting: Family

## 2016-03-25 MED ORDER — SPIRONOLACTONE 25 MG PO TABS: 50.0000 mg | ORAL_TABLET | Freq: Every day | ORAL | 5 refills | Status: DC

## 2016-03-27 ENCOUNTER — Telehealth: Payer: Self-pay | Admitting: Family

## 2016-03-27 MED ORDER — METOLAZONE 2.5 MG PO TABS: 2.5000 mg | ORAL_TABLET | Freq: Every day | ORAL | 0 refills | Status: DC | PRN

## 2016-03-27 NOTE — Telephone Encounter (Signed)
Patient called to say that he's had an overnight weight gain of 5 pounds. He recently started taking naproxen for his back and now he's gained weight. It's also not helping with his back pain. Weight today is 290 pounds and yesterday it was 285 pounds.   Instructed patient to stop the naproxen and will call in metolazone 2.5mg  that he can take if needed if the weight doesn't come down after stopping the medication. Also advised him to call his PCP and let them know the medication wasn't helping and that he gained 5 pounds overnight.  He's to call back in a couple of days if his weight hasn't come down.

## 2016-03-28 ENCOUNTER — Ambulatory Visit: Payer: Self-pay | Admitting: Ophthalmology

## 2016-04-03 ENCOUNTER — Encounter: Payer: Self-pay | Admitting: Specialist

## 2016-04-05 ENCOUNTER — Other Ambulatory Visit: Payer: Self-pay

## 2016-04-05 MED ORDER — SPIRONOLACTONE 25 MG PO TABS: 50.0000 mg | ORAL_TABLET | Freq: Every day | ORAL | 5 refills | Status: DC

## 2016-04-05 MED ORDER — FUROSEMIDE 40 MG PO TABS: 40.0000 mg | ORAL_TABLET | Freq: Two times a day (BID) | ORAL | 3 refills | Status: DC

## 2016-04-05 MED ORDER — LISINOPRIL 20 MG PO TABS: 40.0000 mg | ORAL_TABLET | Freq: Every day | ORAL | 5 refills | Status: DC

## 2016-04-11 ENCOUNTER — Ambulatory Visit: Payer: Self-pay | Admitting: Adult Health Nurse Practitioner

## 2016-04-11 VITALS — BP 119/68 | HR 93 | Temp 98.5°F | Wt 290.1 lb

## 2016-04-11 DIAGNOSIS — E119 Type 2 diabetes mellitus without complications: Secondary | ICD-10-CM

## 2016-04-11 DIAGNOSIS — J449 Chronic obstructive pulmonary disease, unspecified: Secondary | ICD-10-CM

## 2016-04-11 DIAGNOSIS — I10 Essential (primary) hypertension: Secondary | ICD-10-CM

## 2016-04-11 LAB — GLUCOSE, POCT (MANUAL RESULT ENTRY): POC Glucose: 168 mg/dl — AB (ref 70–99)

## 2016-04-11 NOTE — Progress Notes (Signed)
Patient: Bradley Mckenzie. Male    DOB: 1966/01/05   51 y.o.   MRN: [id number] Visit Date: 04/11/2016  Today's Provider: Staci Acosta, NP   Chief Complaint  Patient presents with  . Follow-up   Subjective:    HPI  Pt states that he never rx for symbicort.  Pt states that he has a hard time giving up his personal information.  Pt states he is using albuterol daily because that's how he was instructed.    Taking all other medications as directed.  Received a glucose meter at last visit- CBGs averaging 130-180    No Known Allergies Previous Medications   ALBUTEROL (PROVENTIL HFA;VENTOLIN HFA) 108 (90 BASE) MCG/ACT INHALER    Inhale 2 puffs into the lungs every 6 (six) hours as needed for wheezing or shortness of breath. Reported on 07/08/2015   ASPIRIN EC 81 MG TABLET    Take 1 tablet (81 mg total) by mouth daily.   CARVEDILOL (COREG) 12.5 MG TABLET    TAKE ONE TABLET BY MOUTH TWICE DAILY WITH A MEAL   FUROSEMIDE (LASIX) 40 MG TABLET    Take 1 tablet (40 mg total) by mouth 2 (two) times daily.   GLIPIZIDE (GLUCOTROL) 10 MG TABLET    Take 1 tablet (10 mg total) by mouth daily before breakfast.   HYDRALAZINE (APRESOLINE) 25 MG TABLET    Take 1 tablet (25 mg total) by mouth 2 (two) times daily.   LISINOPRIL (PRINIVIL,ZESTRIL) 20 MG TABLET    Take 2 tablets (40 mg total) by mouth daily.   LOVASTATIN (MEVACOR) 20 MG TABLET    Take 1 tablet (20 mg total) by mouth at bedtime. D/C 40mg  dosage   MASKS MISC    Patient needs new mask and replacement tubing for CPAP; he has a machine   METOLAZONE (ZAROXOLYN) 2.5 MG TABLET    Take 1 tablet (2.5 mg total) by mouth daily as needed.   SPIRONOLACTONE (ALDACTONE) 25 MG TABLET    Take 2 tablets (50 mg total) by mouth daily.    Review of Systems  All other systems reviewed and are negative.   Social History  Substance Use Topics  . Smoking status: Former Research scientist (life sciences)  . Smokeless tobacco: Never Used     Comment: Quit in his 20's.  . Alcohol use  0.0 oz/week     Comment: occassional alcohol use at special events   Objective:   BP 119/68   Pulse 93   Temp 98.5 F (36.9 C)   Wt 290 lb 1.6 oz (131.6 kg)   BMI 42.84 kg/m   Physical Exam  Constitutional: He appears well-developed and well-nourished.  HENT:  Head: Normocephalic and atraumatic.  Cardiovascular: Normal rate, regular rhythm and normal heart sounds.   Trace pedal edema  Pulmonary/Chest: Effort normal. He has wheezes.  Abdominal: Soft. Bowel sounds are normal.  Vitals reviewed.       Assessment & Plan:      Reviewed labs.     DM:  Continue current regimen.  Reviewed A1C.  Continue diet modification.  Take CBG fasting about 3x weekly.   HTN:  Stable.  Continue current regimen.   HLD:  Stable.  Continue current regimen.   COPD:  Continue Albuterol rescue inhaler as needed.  When he decides about Medication management will send symbicort for PAP.  Discussed importance of medications.  Info given to obtain new CPAP mask.      Staci Acosta, NP   [facility]  [facility]

## 2016-04-16 ENCOUNTER — Ambulatory Visit
Admission: RE | Admit: 2016-04-16 | Discharge: 2016-04-16 | Disposition: A | Discharge status: Home / Self Care | Payer: Self-pay | Source: Ambulatory Visit | Attending: Specialist | Admitting: Specialist

## 2016-04-16 DIAGNOSIS — M545 Low back pain: Secondary | ICD-10-CM | POA: Insufficient documentation

## 2016-04-17 ENCOUNTER — Ambulatory Visit: Payer: Self-pay | Admitting: Specialist

## 2016-04-17 DIAGNOSIS — G8929 Other chronic pain: Secondary | ICD-10-CM

## 2016-04-17 DIAGNOSIS — M545 Low back pain: Principal | ICD-10-CM

## 2016-04-17 NOTE — Progress Notes (Signed)
   Subjective:    Patient ID: Bradley Mckenzie., male    DOB: 15-Oct-1965, 51 y.o.   MRN: [id number]  HPI Pt returns with no improvement with naprosen. He asked for muscle relaxant, which I will not give. Medically there is no indication for the chronic use of a muscle relaxants. Xrays show early DDD. Pt has done PT in past with +/- results.   Review of Systems  Deferred    Objective:   Physical Exam  Deferred      Assessment & Plan:  Ordered a MRI to see if we can explain the increase of pain. Return when MRI is completed.

## 2016-04-19 ENCOUNTER — Other Ambulatory Visit: Payer: Self-pay | Admitting: Adult Health Nurse Practitioner

## 2016-04-19 DIAGNOSIS — E118 Type 2 diabetes mellitus with unspecified complications: Secondary | ICD-10-CM

## 2016-04-23 ENCOUNTER — Ambulatory Visit: Payer: Self-pay | Attending: Family | Admitting: Family

## 2016-04-23 ENCOUNTER — Encounter: Payer: Self-pay | Admitting: Family

## 2016-04-23 VITALS — BP 148/70 | HR 95 | Resp 18 | Ht 69.0 in | Wt 291.5 lb

## 2016-04-23 DIAGNOSIS — E119 Type 2 diabetes mellitus without complications: Secondary | ICD-10-CM | POA: Insufficient documentation

## 2016-04-23 DIAGNOSIS — E118 Type 2 diabetes mellitus with unspecified complications: Secondary | ICD-10-CM

## 2016-04-23 DIAGNOSIS — I10 Essential (primary) hypertension: Secondary | ICD-10-CM

## 2016-04-23 DIAGNOSIS — Z7982 Long term (current) use of aspirin: Secondary | ICD-10-CM | POA: Insufficient documentation

## 2016-04-23 DIAGNOSIS — Z7984 Long term (current) use of oral hypoglycemic drugs: Secondary | ICD-10-CM | POA: Insufficient documentation

## 2016-04-23 DIAGNOSIS — R0602 Shortness of breath: Secondary | ICD-10-CM | POA: Insufficient documentation

## 2016-04-23 DIAGNOSIS — I5042 Chronic combined systolic (congestive) and diastolic (congestive) heart failure: Secondary | ICD-10-CM | POA: Insufficient documentation

## 2016-04-23 DIAGNOSIS — G8929 Other chronic pain: Secondary | ICD-10-CM | POA: Insufficient documentation

## 2016-04-23 DIAGNOSIS — Z79899 Other long term (current) drug therapy: Secondary | ICD-10-CM | POA: Insufficient documentation

## 2016-04-23 DIAGNOSIS — I5022 Chronic systolic (congestive) heart failure: Secondary | ICD-10-CM

## 2016-04-23 DIAGNOSIS — G4733 Obstructive sleep apnea (adult) (pediatric): Secondary | ICD-10-CM | POA: Insufficient documentation

## 2016-04-23 DIAGNOSIS — Z8249 Family history of ischemic heart disease and other diseases of the circulatory system: Secondary | ICD-10-CM | POA: Insufficient documentation

## 2016-04-23 DIAGNOSIS — F329 Major depressive disorder, single episode, unspecified: Secondary | ICD-10-CM | POA: Insufficient documentation

## 2016-04-23 DIAGNOSIS — Z5189 Encounter for other specified aftercare: Secondary | ICD-10-CM | POA: Insufficient documentation

## 2016-04-23 DIAGNOSIS — I110 Hypertensive heart disease with heart failure: Secondary | ICD-10-CM | POA: Insufficient documentation

## 2016-04-23 DIAGNOSIS — R5383 Other fatigue: Secondary | ICD-10-CM | POA: Insufficient documentation

## 2016-04-23 DIAGNOSIS — M545 Low back pain, unspecified: Secondary | ICD-10-CM

## 2016-04-23 DIAGNOSIS — Z9889 Other specified postprocedural states: Secondary | ICD-10-CM | POA: Insufficient documentation

## 2016-04-23 DIAGNOSIS — I429 Cardiomyopathy, unspecified: Secondary | ICD-10-CM | POA: Insufficient documentation

## 2016-04-23 DIAGNOSIS — Z809 Family history of malignant neoplasm, unspecified: Secondary | ICD-10-CM | POA: Insufficient documentation

## 2016-04-23 DIAGNOSIS — E785 Hyperlipidemia, unspecified: Secondary | ICD-10-CM | POA: Insufficient documentation

## 2016-04-23 MED ORDER — GLIPIZIDE 10 MG PO TABS
10.0000 mg | ORAL_TABLET | Freq: Every day | ORAL | 5 refills | Status: DC
Start: 2016-04-23 — End: 2016-07-09

## 2016-04-23 NOTE — Progress Notes (Signed)
Patient ID: Bradley Rust., male    DOB: 01-11-1966, 51 y.o.   MRN: [id number]  HPI  Bradley Mckenzie is a 51 y/o male who has a history of diabetes, HTN, obstructive sleep apnea, depression, hyperlipidemia, morbid obesity and heart failure with a reduced ejection fraction. Remote tobacco exposure.   Last echo was done 09/02/15 with an EF of 30-35%, mild mitral regurgitation and no aortic stenosis. EF has declined from 40-45% in March 2017 but he had been inconsistent taking his medications & not using his CPAP during this time.  Last admitted to [facility] 09/01/15 with acute exacerbation of HF and uncontrolled HTN. Was given IV diuretics and assistance with CPAP. Also started on glipizide.  Returns today for a follow-up with a mild amount of fatigue and shortness of breath with exertion. Is having some edema in his lower legs but none in his abdomen. Weight has been stable.  Continues to have some chronic back pain and is being worked up by [employer]. Still having difficulty sleeping due to CPAP mask leaking.   Past Medical History:  Diagnosis Date  . Cardiomyopathy ([place])    a. 01/2014 Echo: EF 25-30%;  b. ? ischemic vs non-ischemic.  He's never had an ischemic eval.  . Chronic combined systolic (congestive) and diastolic (congestive) heart failure    a. 01/2014 Echo: EF 25-30%. b. Echo 03/2015: Improved EF of 40-45%  . Depression   . Diabetes mellitus without complication ([place])    a. Not previously on outpt meds.  . Hyperlipidemia   . Hypertensive heart disease    a. Since his 41's.  . Morbid obesity ([place])   . Obstructive sleep apnea    a. Has not used CPAP since ~ 2012.   Past Surgical History:  Procedure Laterality Date  . HERNIA REPAIR  2004   Family History  Problem Relation Age of Onset  . Heart disease Mother     alive & well.  [name] Heart attack Father 36    died @ 46 of cancer.  . Diabetes Father   . Cancer Father     "in his stomach"  . Cancer Sister   . Cancer Paternal  Uncle    Social History  Substance Use Topics  . Smoking status: Former Research scientist (life sciences)  . Smokeless tobacco: Never Used     Comment: Quit in his 20's.  . Alcohol use 0.0 oz/week     Comment: occassional alcohol use at special events   No Known Allergies Prior to Admission medications   Medication Sig Start Date End Date Taking? Authorizing Provider  albuterol (PROVENTIL HFA;VENTOLIN HFA) 108 (90 Base) MCG/ACT inhaler Inhale 2 puffs into the lungs every 6 (six) hours as needed for wheezing or shortness of breath. Reported on 07/08/2015 09/25/15  Yes Alisa Graff, FNP  aspirin EC 81 MG tablet Take 1 tablet (81 mg total) by mouth daily. 09/25/15  Yes Alisa Graff, FNP  carvedilol (COREG) 12.5 MG tablet TAKE ONE TABLET BY MOUTH TWICE DAILY WITH A MEAL 02/25/16  Yes Alisa Graff, FNP  furosemide (LASIX) 40 MG tablet Take 1 tablet (40 mg total) by mouth 2 (two) times daily. 04/05/16  Yes Alisa Graff, FNP  glipiZIDE (GLUCOTROL) 10 MG tablet Take 1 tablet (10 mg total) by mouth daily before breakfast. 04/23/16  Yes Alisa Graff, FNP  hydrALAZINE (APRESOLINE) 25 MG tablet Take 1 tablet (25 mg total) by mouth 2 (two) times daily. 09/25/15  Yes Alisa Graff,  FNP  lisinopril (PRINIVIL,ZESTRIL) 20 MG tablet Take 2 tablets (40 mg total) by mouth daily. 04/05/16  Yes Alisa Graff, FNP  lovastatin (MEVACOR) 20 MG tablet Take 1 tablet (20 mg total) by mouth at bedtime. D/C 40mg  dosage 10/19/15  Yes Alisa Graff, [place] Patient needs new mask and replacement tubing for CPAP; he has a machine 12/12/15  Yes Arnetha Courser, MD  spironolactone (ALDACTONE) 25 MG tablet Take 2 tablets (50 mg total) by mouth daily. 04/05/16  Yes Alisa Graff, FNP     Review of Systems  Constitutional: Positive for fatigue. Negative for appetite change.  HENT: Positive for congestion and postnasal drip. Negative for sore throat.   Eyes: Negative.   Respiratory: Positive for cough and shortness of breath. Negative for  chest tightness.   Cardiovascular: Positive for leg swelling. Negative for chest pain.  Gastrointestinal: Negative for abdominal distention and abdominal pain.  Endocrine: Negative.   Genitourinary: Negative.   Musculoskeletal: Positive for back pain. Negative for neck pain.  Skin: Negative.   Allergic/Immunologic: Negative.   Neurological: Positive for light-headedness (when coughing). Negative for dizziness.  Hematological: Negative for adenopathy. Does not bruise/bleed easily.  Psychiatric/Behavioral: Positive for sleep disturbance (+ snoring since not wearing CPAP mask). Negative for dysphoric mood and suicidal ideas. The patient is not nervous/anxious.    Vitals:   04/23/16 1102  BP: (!) 148/70  Pulse: 95  Resp: 18  SpO2: 96%  Weight: 291 lb 8 oz (132.2 kg)  Height: 5\' 9"  (1.753 m)   Wt Readings from Last 3 Encounters:  04/23/16 291 lb 8 oz (132.2 kg)  04/11/16 290 lb 1.6 oz (131.6 kg)  03/21/16 291 lb (132 kg)   Lab Results  Component Value Date   CREATININE 1.39 (H) 03/14/2016   CREATININE 1.27 12/12/2015   CREATININE 1.15 11/22/2015   Physical Exam  Constitutional: He is oriented to person, place, and time. He appears well-developed and well-nourished.  HENT:  Head: Normocephalic and atraumatic.  Eyes: Conjunctivae are normal. Pupils are equal, round, and reactive to light.  Neck: Normal range of motion. Neck supple. No JVD present.  Cardiovascular: Normal rate and regular rhythm.   Pulmonary/Chest: Effort normal. He has no wheezes. He has no rales.  Abdominal: Soft. He exhibits no distension. There is no tenderness.  Musculoskeletal: He exhibits edema (1+ pitting edema in bilateral lower legs). He exhibits no tenderness.  Neurological: He is alert and oriented to person, place, and time.  Skin: Skin is warm and dry.  Psychiatric: He has a normal mood and affect. His behavior is normal.  Nursing note and vitals reviewed.   Assessment & Plan:  1: Chronic heart  failure with reduced ejection fraction-  - NYHA Class II - mildly fluid overloaded with pedal edema - weight stable. Reminded to call for an overnight weight gain of >2 pounds or a weekly weight gain of >5 pounds - Could consider titrating up carvedilol in the future - Could consider switching his lisinopril to entresto but would have to use the company's foundation assistance as he's currently without any insurance and gets his medications off the $4.00 list at [facility]. He does not want to go through [facility]. He really doesn't want to use the company's foundation either.  - admits that he's been eating saltier foods because his finances have been tight and the saltier foods are more inexpensive to buy. Increased sodium intake could be contributing to pedal edema. Encouraged  to elevate his legs.   2: HTN- - looks good today - went to [facility] on 04/17/16  3: Obstructive sleep apnea- - Reports trying to wear his CPAP on a nightly basis - says that his mask is leaking and no longer fits correctly.  - had faxed home health information to see if they could help him with a new mask. Will have CMA follow-up. He says that he's using the mask/hose that he had used when he was in the hospital this last time but that his machine is from years ago. -certainly his inadequately treated sleep apnea could be contributing to his pedal edema. He says that he feels more tired recently.  - remains uninsured  4: Diabetes- - Taking glipizide daily - A1c is now 7.7% as of 03/14/16   5: Chronic low back pain- - says that he's having to fill out some paperwork for [facility] so that he can have an MRI done on his back  Patient did not bring his medications nor a list. Each medication was verbally reviewed with the patient and he was encouraged to bring the bottles to every visit to confirm accuracy of list.  Return in 1 month or sooner for any questions/problems before then.

## 2016-04-23 NOTE — Patient Instructions (Signed)
Continue weighing daily and call for an overnight weight gain of > 2 pounds or a weekly weight gain of >5 pounds. 

## 2016-04-24 ENCOUNTER — Ambulatory Visit: Payer: Self-pay | Admitting: Specialist

## 2016-05-20 ENCOUNTER — Other Ambulatory Visit: Payer: Self-pay

## 2016-05-20 MED ORDER — CARVEDILOL 12.5 MG PO TABS: 12.5000 mg | ORAL_TABLET | Freq: Two times a day (BID) | ORAL | 5 refills | Status: DC

## 2016-05-20 MED ORDER — FUROSEMIDE 40 MG PO TABS: 40.0000 mg | ORAL_TABLET | Freq: Two times a day (BID) | ORAL | 3 refills | Status: DC

## 2016-05-20 MED ORDER — HYDRALAZINE HCL 25 MG PO TABS: 25.0000 mg | ORAL_TABLET | Freq: Two times a day (BID) | ORAL | 3 refills | Status: DC

## 2016-05-20 MED ORDER — LOVASTATIN 20 MG PO TABS: 20.0000 mg | ORAL_TABLET | Freq: Every day | ORAL | 5 refills | Status: DC

## 2016-05-28 ENCOUNTER — Encounter: Payer: Self-pay | Admitting: Family

## 2016-05-28 ENCOUNTER — Ambulatory Visit: Payer: Self-pay | Attending: Family | Admitting: Family

## 2016-05-28 VITALS — BP 170/90 | HR 100 | Resp 20 | Ht 69.0 in | Wt 295.5 lb

## 2016-05-28 DIAGNOSIS — I5042 Chronic combined systolic (congestive) and diastolic (congestive) heart failure: Secondary | ICD-10-CM | POA: Insufficient documentation

## 2016-05-28 DIAGNOSIS — E785 Hyperlipidemia, unspecified: Secondary | ICD-10-CM | POA: Insufficient documentation

## 2016-05-28 DIAGNOSIS — Z833 Family history of diabetes mellitus: Secondary | ICD-10-CM | POA: Insufficient documentation

## 2016-05-28 DIAGNOSIS — Z800 Family history of malignant neoplasm of digestive organs: Secondary | ICD-10-CM | POA: Insufficient documentation

## 2016-05-28 DIAGNOSIS — I5022 Chronic systolic (congestive) heart failure: Secondary | ICD-10-CM

## 2016-05-28 DIAGNOSIS — Z87891 Personal history of nicotine dependence: Secondary | ICD-10-CM | POA: Insufficient documentation

## 2016-05-28 DIAGNOSIS — Z7982 Long term (current) use of aspirin: Secondary | ICD-10-CM | POA: Insufficient documentation

## 2016-05-28 DIAGNOSIS — I110 Hypertensive heart disease with heart failure: Secondary | ICD-10-CM | POA: Insufficient documentation

## 2016-05-28 DIAGNOSIS — I10 Essential (primary) hypertension: Secondary | ICD-10-CM

## 2016-05-28 DIAGNOSIS — Z6841 Body Mass Index (BMI) 40.0 and over, adult: Secondary | ICD-10-CM | POA: Insufficient documentation

## 2016-05-28 DIAGNOSIS — E119 Type 2 diabetes mellitus without complications: Secondary | ICD-10-CM | POA: Insufficient documentation

## 2016-05-28 DIAGNOSIS — Z7984 Long term (current) use of oral hypoglycemic drugs: Secondary | ICD-10-CM | POA: Insufficient documentation

## 2016-05-28 DIAGNOSIS — Z9889 Other specified postprocedural states: Secondary | ICD-10-CM | POA: Insufficient documentation

## 2016-05-28 DIAGNOSIS — I429 Cardiomyopathy, unspecified: Secondary | ICD-10-CM | POA: Insufficient documentation

## 2016-05-28 DIAGNOSIS — Z8249 Family history of ischemic heart disease and other diseases of the circulatory system: Secondary | ICD-10-CM | POA: Insufficient documentation

## 2016-05-28 DIAGNOSIS — G4733 Obstructive sleep apnea (adult) (pediatric): Secondary | ICD-10-CM

## 2016-05-28 DIAGNOSIS — Z809 Family history of malignant neoplasm, unspecified: Secondary | ICD-10-CM | POA: Insufficient documentation

## 2016-05-28 NOTE — Progress Notes (Signed)
Patient ID: Bradley Mckenzie., male    DOB: [birth year], 51 y.o.   MRN: [id number]  HPI  Bradley Mckenzie is a 51 y/o male who has a history of diabetes, HTN, obstructive sleep apnea, depression, hyperlipidemia, morbid obesity and heart failure with a reduced ejection fraction. Remote tobacco exposure.   Last echo was done 09/02/15 with an EF of 30-35%, mild mitral regurgitation and no aortic stenosis. EF has declined from 40-45% in March 2017 but he had been inconsistent taking his medications & not using his CPAP during this time.  Last admitted to [facility] 09/01/15 with acute exacerbation of HF and uncontrolled HTN. Was given IV diuretics and assistance with CPAP. Also started on glipizide.  Returns today for a follow-up visit with a chief complaint of moderate shortness of breath with minimal exertion. He says that this has been present for "years" with waxing and waning of severity. He has associated fatigue, cough, wheezing and difficulty sleeping along with this. He says that his shortness of breath has worsened since he hasn't been able to use his CPAP mask.   Past Medical History:  Diagnosis Date  . Cardiomyopathy ([place])    a. 01/2014 Echo: EF 25-30%;  b. ? ischemic vs non-ischemic.  He's never had an ischemic eval.  . Chronic combined systolic (congestive) and diastolic (congestive) heart failure ([place])    a. 01/2014 Echo: EF 25-30%. b. Echo 03/2015: Improved EF of 40-45%  . Depression   . Diabetes mellitus without complication ([place])    a. Not previously on outpt meds.  . Hyperlipidemia   . Hypertensive heart disease    a. Since his 47's.  . Morbid obesity ([place])   . Obstructive sleep apnea    a. Has not used CPAP since ~ 2012.   Past Surgical History:  Procedure Laterality Date  . HERNIA REPAIR  2004   Family History  Problem Relation Age of Onset  . Heart disease Mother        alive & well.  [name] Heart attack Father 34       died @ 34 of cancer.  . Diabetes Father   . Cancer Father        "in his stomach"  . Cancer Sister   . Cancer Paternal Uncle    Social History  Substance Use Topics  . Smoking status: Former Research scientist (life sciences)  . Smokeless tobacco: Never Used     Comment: Quit in his 20's.  . Alcohol use 0.0 oz/week     Comment: occassional alcohol use at special events   No Known Allergies Prior to Admission medications   Medication Sig Start Date End Date Taking? Authorizing Provider  albuterol (PROVENTIL HFA;VENTOLIN HFA) 108 (90 Base) MCG/ACT inhaler Inhale 2 puffs into the lungs every 6 (six) hours as needed for wheezing or shortness of breath. Reported on 07/08/2015 09/25/15  Yes Alisa Graff, FNP  aspirin EC 81 MG tablet Take 1 tablet (81 mg total) by mouth daily. 09/25/15  Yes Alisa Graff, FNP  carvedilol (COREG) 12.5 MG tablet TAKE ONE TABLET BY MOUTH TWICE DAILY WITH A MEAL 02/25/16  Yes Alisa Graff, FNP  furosemide (LASIX) 40 MG tablet Take 1 tablet (40 mg total) by mouth 2 (two) times daily. 04/05/16  Yes Alisa Graff, FNP  glipiZIDE (GLUCOTROL) 10 MG tablet Take 1 tablet (10 mg total) by mouth daily before breakfast. 04/23/16  Yes Alisa Graff, FNP  hydrALAZINE (APRESOLINE) 25 MG tablet Take 1 tablet (25 mg  total) by mouth 2 (two) times daily. 09/25/15  Yes Alisa Graff, FNP  lisinopril (PRINIVIL,ZESTRIL) 20 MG tablet Take 2 tablets (40 mg total) by mouth daily. 04/05/16  Yes Alisa Graff, FNP  lovastatin (MEVACOR) 20 MG tablet Take 1 tablet (20 mg total) by mouth at bedtime. D/C 40mg  dosage 10/19/15  Yes Alisa Graff, [place] Patient needs new mask and replacement tubing for CPAP; he has a machine 12/12/15  Yes Arnetha Courser, MD  spironolactone (ALDACTONE) 25 MG tablet Take 2 tablets (50 mg total) by mouth daily. 04/05/16  Yes Alisa Graff, FNP     Review of Systems  Constitutional: Positive for fatigue. Negative for appetite change.  HENT: Positive for congestion and rhinorrhea. Negative for sore throat.   Eyes: Negative.   Respiratory:  Positive for cough, shortness of breath and wheezing. Negative for chest tightness.   Cardiovascular: Negative for chest pain, palpitations and leg swelling.  Gastrointestinal: Negative for abdominal distention and abdominal pain.  Endocrine: Negative.   Genitourinary: Negative.   Musculoskeletal: Positive for back pain. Negative for neck pain.  Skin: Negative.   Allergic/Immunologic: Negative.   Neurological: Negative for dizziness and light-headedness.  Hematological: Negative for adenopathy. Does not bruise/bleed easily.  Psychiatric/Behavioral: Positive for sleep disturbance (+ snoring since not wearing CPAP mask). Negative for dysphoric mood and suicidal ideas. The patient is not nervous/anxious.    Vitals:   05/28/16 1128  Pulse: 100  Resp: 20  SpO2: 98%  Weight: 295 lb 8 oz (134 kg)  Height: 5\' 9"  (1.753 m)   Wt Readings from Last 3 Encounters:  05/28/16 295 lb 8 oz (134 kg)  04/23/16 291 lb 8 oz (132.2 kg)  04/11/16 290 lb 1.6 oz (131.6 kg)   Lab Results  Component Value Date   CREATININE 1.39 (H) 03/14/2016   CREATININE 1.27 12/12/2015   CREATININE 1.15 11/22/2015   Physical Exam  Constitutional: He is oriented to person, place, and time. He appears well-developed and well-nourished.  HENT:  Head: Normocephalic and atraumatic.  Neck: Normal range of motion. Neck supple. No JVD present.  Cardiovascular: Normal rate and regular rhythm.   Pulmonary/Chest: Effort normal. He has no wheezes. He has no rales.  Abdominal: Soft. He exhibits no distension. There is no tenderness.  Musculoskeletal: He exhibits no edema or tenderness.  Neurological: He is alert and oriented to person, place, and time.  Skin: Skin is warm and dry.  Psychiatric: He has a normal mood and affect. His behavior is normal.  Nursing note and vitals reviewed.   Assessment & Plan:  1: Chronic heart failure with reduced ejection fraction-  - NYHA Class III - mildly fluid overloaded with increased  weight - says that he was out of his furosemide for 4 days due to pharmacy issues and now feels like he's playing "catch up". Did take 2.5mg  metolazone twice last week as he still had some but doesn't feel like it helped any - weight up 4 pounds since he was last here. Reminded to call for an overnight weight gain of >2 pounds or a weekly weight gain of >5 pounds - take an extra 40mg  furosemide daily both today and tomorrow - Could consider titrating up carvedilol in the future - Could consider switching his lisinopril to entresto but would have to use the company's foundation assistance as he's currently without any insurance and gets his medications off the $4.00 list at [facility]. He does not want to go  through [facility]. He really doesn't want to use the company's foundation either.  - admits that he's been eating saltier foods because his finances have been tight and the saltier foods are more inexpensive to buy.   2: HTN- - BP elevated but, again, he was without his furosemide for 4 days - went to [facility] on 04/17/16  3: Obstructive sleep apnea- - Reports trying to wear his CPAP on a nightly basis - says that his mask is leaking and no longer fits correctly.  [facility] [employer] will be paying for a new mask. Patient is to go to [employer] to get the mask and the Foundation will be invoiced for the cost -certainly his inadequately treated sleep apnea could be contributing to his worsening shortness of breath. He says that he feels more tired recently.  - remains uninsured  Patient did not bring his medications nor a list. Each medication was verbally reviewed with the patient and he was encouraged to bring the bottles to every visit to confirm accuracy of list.  Return in 1 month or sooner for any questions/problems before then.

## 2016-05-28 NOTE — Patient Instructions (Signed)
Continue weighing daily and call for an overnight weight gain of > 2 pounds or a weekly weight gain of >5 pounds. 

## 2016-07-02 ENCOUNTER — Ambulatory Visit
Admission: RE | Admit: 2016-07-02 | Discharge: 2016-07-02 | Disposition: A | Discharge status: Home / Self Care | Payer: Self-pay | Source: Ambulatory Visit | Attending: Specialist | Admitting: Specialist

## 2016-07-02 DIAGNOSIS — G8929 Other chronic pain: Secondary | ICD-10-CM | POA: Insufficient documentation

## 2016-07-02 DIAGNOSIS — M545 Low back pain: Secondary | ICD-10-CM

## 2016-07-02 DIAGNOSIS — M5126 Other intervertebral disc displacement, lumbar region: Secondary | ICD-10-CM | POA: Insufficient documentation

## 2016-07-04 ENCOUNTER — Other Ambulatory Visit: Payer: Self-pay

## 2016-07-04 DIAGNOSIS — E119 Type 2 diabetes mellitus without complications: Secondary | ICD-10-CM

## 2016-07-04 DIAGNOSIS — E782 Mixed hyperlipidemia: Secondary | ICD-10-CM

## 2016-07-05 LAB — LIPID PANEL
CHOL/HDL RATIO: 3.8 ratio (ref 0.0–5.0)
Cholesterol, Total: 158 mg/dL (ref 100–199)
HDL: 42 mg/dL (ref >39)
LDL Calculated: 98 mg/dL (ref 0–99)
Triglycerides: 88 mg/dL (ref 0–149)
VLDL CHOLESTEROL CAL: 18 mg/dL (ref 5–40)

## 2016-07-05 LAB — COMPREHENSIVE METABOLIC PANEL
A/G RATIO: 1.2 (ref 1.2–2.2)
ALK PHOS: 93 IU/L (ref 39–117)
ALT: 21 IU/L (ref 0–44)
AST: 17 IU/L (ref 0–40)
Albumin: 4.1 g/dL (ref 3.5–5.5)
BUN/Creatinine Ratio: 12 (ref 9–20)
BUN: 16 mg/dL (ref 6–24)
CHLORIDE: 100 mmol/L (ref 96–106)
CO2: 23 mmol/L (ref 20–29)
Calcium: 9.7 mg/dL (ref 8.7–10.2)
Creatinine, Ser: 1.35 mg/dL — ABNORMAL HIGH (ref 0.76–1.27)
GFR calc Af Amer: 71 mL/min/{1.73_m2} (ref >59)
GFR calc non Af Amer: 61 mL/min/{1.73_m2} (ref >59)
GLOBULIN, TOTAL: 3.3 g/dL (ref 1.5–4.5)
Glucose: 139 mg/dL — ABNORMAL HIGH (ref 65–99)
POTASSIUM: 4.1 mmol/L (ref 3.5–5.2)
SODIUM: 138 mmol/L (ref 134–144)
Total Protein: 7.4 g/dL (ref 6.0–8.5)

## 2016-07-05 LAB — HEMOGLOBIN A1C
Est. average glucose Bld gHb Est-mCnc: 203 mg/dL
HEMOGLOBIN A1C: 8.7 % — AB (ref 4.8–5.6)

## 2016-07-09 ENCOUNTER — Ambulatory Visit: Payer: Self-pay | Attending: Family | Admitting: Family

## 2016-07-09 ENCOUNTER — Encounter: Payer: Self-pay | Admitting: Family

## 2016-07-09 ENCOUNTER — Other Ambulatory Visit: Payer: Self-pay

## 2016-07-09 VITALS — BP 162/72 | HR 86 | Resp 20 | Ht 69.0 in | Wt 295.1 lb

## 2016-07-09 DIAGNOSIS — I10 Essential (primary) hypertension: Secondary | ICD-10-CM

## 2016-07-09 DIAGNOSIS — E119 Type 2 diabetes mellitus without complications: Secondary | ICD-10-CM

## 2016-07-09 DIAGNOSIS — Z7984 Long term (current) use of oral hypoglycemic drugs: Secondary | ICD-10-CM | POA: Insufficient documentation

## 2016-07-09 DIAGNOSIS — I429 Cardiomyopathy, unspecified: Secondary | ICD-10-CM | POA: Insufficient documentation

## 2016-07-09 DIAGNOSIS — E785 Hyperlipidemia, unspecified: Secondary | ICD-10-CM | POA: Insufficient documentation

## 2016-07-09 DIAGNOSIS — I110 Hypertensive heart disease with heart failure: Secondary | ICD-10-CM | POA: Insufficient documentation

## 2016-07-09 DIAGNOSIS — F329 Major depressive disorder, single episode, unspecified: Secondary | ICD-10-CM | POA: Insufficient documentation

## 2016-07-09 DIAGNOSIS — I5022 Chronic systolic (congestive) heart failure: Secondary | ICD-10-CM

## 2016-07-09 DIAGNOSIS — I5042 Chronic combined systolic (congestive) and diastolic (congestive) heart failure: Secondary | ICD-10-CM | POA: Insufficient documentation

## 2016-07-09 DIAGNOSIS — G4733 Obstructive sleep apnea (adult) (pediatric): Secondary | ICD-10-CM

## 2016-07-09 DIAGNOSIS — Z87891 Personal history of nicotine dependence: Secondary | ICD-10-CM | POA: Insufficient documentation

## 2016-07-09 DIAGNOSIS — Z7982 Long term (current) use of aspirin: Secondary | ICD-10-CM | POA: Insufficient documentation

## 2016-07-09 LAB — GLUCOSE, CAPILLARY: Glucose-Capillary: 261 mg/dL — ABNORMAL HIGH (ref 65–99)

## 2016-07-09 MED ORDER — CARVEDILOL 12.5 MG PO TABS: 12.5000 mg | ORAL_TABLET | Freq: Two times a day (BID) | ORAL | 0 refills | Status: DC

## 2016-07-09 MED ORDER — GLIPIZIDE 10 MG PO TABS: 10.0000 mg | ORAL_TABLET | Freq: Every day | ORAL | 5 refills | Status: DC

## 2016-07-09 MED ORDER — ASPIRIN EC 81 MG PO TBEC: 81.0000 mg | DELAYED_RELEASE_TABLET | Freq: Every day | ORAL | 3 refills | Status: DC

## 2016-07-09 MED ORDER — FUROSEMIDE 40 MG PO TABS: 40.0000 mg | ORAL_TABLET | Freq: Two times a day (BID) | ORAL | 3 refills | Status: DC

## 2016-07-09 MED ORDER — LISINOPRIL 20 MG PO TABS: 40.0000 mg | ORAL_TABLET | Freq: Every day | ORAL | 5 refills | Status: DC

## 2016-07-09 MED ORDER — HYDRALAZINE HCL 25 MG PO TABS: 25.0000 mg | ORAL_TABLET | Freq: Two times a day (BID) | ORAL | 3 refills | Status: DC

## 2016-07-09 MED ORDER — SPIRONOLACTONE 25 MG PO TABS: 50.0000 mg | ORAL_TABLET | Freq: Every day | ORAL | 5 refills | Status: DC

## 2016-07-09 NOTE — Progress Notes (Signed)
Patient ID: Bradley Woodmansee., male    DOB: 23-Sep-1965, 51 y.o.   MRN: [id number]  HPI  Bradley Mckenzie is a 51 y/o male who has a history of diabetes, HTN, obstructive sleep apnea, depression, hyperlipidemia, morbid obesity and heart failure with a reduced ejection fraction. Remote tobacco exposure.   Last echo was done 09/02/15 with an EF of 30-35%, mild mitral regurgitation and no aortic stenosis. EF has declined from 40-45% in March 2017 but he had been inconsistent taking his medications & not using his CPAP during this time.  Last admitted to [facility] 09/01/15 with acute exacerbation of HF and uncontrolled HTN. Was given IV diuretics and assistance with CPAP. Also started on glipizide.  Returns today for a follow-up visit with a chief complaint of moderate shortness of breath with minimal exertion. He says this has been present for years with waxing/waning of symptoms. He has associated fatigue, chest tightness, cough and abdominal distention. He has been out of all his medications for the last 5 days due to finances.   Past Medical History:  Diagnosis Date  . Cardiomyopathy ([place])    a. 01/2014 Echo: EF 25-30%;  b. ? ischemic vs non-ischemic.  He's never had an ischemic eval.  . Chronic combined systolic (congestive) and diastolic (congestive) heart failure ([place])    a. 01/2014 Echo: EF 25-30%. b. Echo 03/2015: Improved EF of 40-45%  . Depression   . Diabetes mellitus without complication ([place])    a. Not previously on outpt meds.  . Hyperlipidemia   . Hypertensive heart disease    a. Since his 63's.  . Morbid obesity ([place])   . Obstructive sleep apnea    a. Has not used CPAP since ~ 2012.   Past Surgical History:  Procedure Laterality Date  . HERNIA REPAIR  2004   Family History  Problem Relation Age of Onset  . Heart disease Mother        alive & well.  [name] Heart attack Father 92       died @ 5 of cancer.  . Diabetes Father   . Cancer Father        "in his stomach"  . Cancer Sister    . Cancer Paternal Uncle    Social History  Substance Use Topics  . Smoking status: Former Research scientist (life sciences)  . Smokeless tobacco: Never Used     Comment: Quit in his 20's.  . Alcohol use 0.0 oz/week     Comment: occassional alcohol use at special events   No Known Allergies  Prior to Admission medications   Medication Sig Start Date End Date Taking? Authorizing Provider  albuterol (PROVENTIL HFA;VENTOLIN HFA) 108 (90 Base) MCG/ACT inhaler Inhale 2 puffs into the lungs every 6 (six) hours as needed for wheezing or shortness of breath. Reported on 07/08/2015 Patient not taking: Reported on 07/09/2016 09/25/15   Alisa Graff, FNP  aspirin EC 81 MG tablet Take 1 tablet (81 mg total) by mouth daily. 07/09/16   Alisa Graff, FNP  carvedilol (COREG) 12.5 MG tablet Take 1 tablet (12.5 mg total) by mouth 2 (two) times daily with a meal. 07/09/16 08/08/16  Alisa Graff, FNP  furosemide (LASIX) 40 MG tablet Take 1 tablet (40 mg total) by mouth 2 (two) times daily. 07/09/16   Alisa Graff, FNP  glipiZIDE (GLUCOTROL) 10 MG tablet Take 1 tablet (10 mg total) by mouth daily before breakfast. 07/09/16   Alisa Graff, FNP  hydrALAZINE (APRESOLINE) 25 MG tablet  Take 1 tablet (25 mg total) by mouth 2 (two) times daily. 07/09/16   Alisa Graff, FNP  lisinopril (PRINIVIL,ZESTRIL) 20 MG tablet Take 2 tablets (40 mg total) by mouth daily. 07/09/16   Alisa Graff, FNP  lovastatin (MEVACOR) 20 MG tablet Take 1 tablet (20 mg total) by mouth at bedtime. D/C 40mg  dosage Patient not taking: Reported on 07/09/2016 05/20/16   Alisa Graff, FNP  spironolactone (ALDACTONE) 25 MG tablet Take 2 tablets (50 mg total) by mouth daily. 07/09/16   Alisa Graff, FNP     Review of Systems  Constitutional: Positive for fatigue. Negative for appetite change.  HENT: Positive for congestion. Negative for postnasal drip and sore throat.   Eyes: Negative.   Respiratory: Positive for cough, chest tightness (over lower sternum),  shortness of breath and wheezing.   Cardiovascular: Negative for chest pain and palpitations.  Gastrointestinal: Positive for abdominal distention. Negative for abdominal pain and nausea.  Endocrine: Negative.   Genitourinary: Negative.   Musculoskeletal: Positive for back pain. Negative for neck pain.  Skin: Negative.   Allergic/Immunologic: Negative.   Neurological: Negative for dizziness and light-headedness.  Hematological: Negative for adenopathy. Does not bruise/bleed easily.  Psychiatric/Behavioral: Positive for sleep disturbance (wearing CPAP nightly for the last month). Negative for dysphoric mood and suicidal ideas. The patient is not nervous/anxious.    Vitals:   07/09/16 1149  BP: (!) 162/72  Pulse: 86  Resp: 20  SpO2: 98%  Weight: 295 lb 2 oz (133.9 kg)  Height: 5\' 9"  (1.753 m)   Wt Readings from Last 3 Encounters:  07/09/16 295 lb 2 oz (133.9 kg)  05/28/16 295 lb 8 oz (134 kg)  04/23/16 291 lb 8 oz (132.2 kg)   Lab Results  Component Value Date   CREATININE 1.35 (H) 07/04/2016   CREATININE 1.39 (H) 03/14/2016   CREATININE 1.27 12/12/2015   Physical Exam  Constitutional: He is oriented to person, place, and time. He appears well-developed and well-nourished.  HENT:  Head: Normocephalic and atraumatic.  Neck: Normal range of motion. Neck supple. No JVD present.  Cardiovascular: Normal rate and regular rhythm.   Pulmonary/Chest: Effort normal. He has no wheezes. He has no rales.  Abdominal: Soft. He exhibits distension. There is no tenderness.  Musculoskeletal: He exhibits no edema or tenderness.  Neurological: He is alert and oriented to person, place, and time.  Skin: Skin is warm and dry.  Psychiatric: He has a normal mood and affect. His behavior is normal.  Nursing note and vitals reviewed.   Assessment & Plan:  1: Chronic heart failure with reduced ejection fraction-  - NYHA Class III - mildly fluid overloaded today - has not taken any of his  medications in the last 5 days because of his finances and says that he won't be able to pick them up until after 07/18/16. Will call medication management clinic to see if they can assist [place]. If not, will call the charitable foundation.  - weight stable since he was last here. Reminded to call for an overnight weight gain of >2 pounds or a weekly weight gain of >5 pounds - Could consider titrating up carvedilol in the future - Could consider switching his lisinopril to entresto  - admits that he's been eating saltier foods because his finances have been tight and the saltier foods are more inexpensive to buy.  - discussed extensively the importance of going to medication management clinic to get established so that they can  help provide medications. He has been resistant to go in the past as he says that he doesn't "want to give them financial information". Explained that it's not really any different than giving his information to [facility] (which he's already done) or to the hospital for charity care (which he's already done). Brochure given to him on medication management clinic.   2: HTN- - BP elevated but, once again, he's out of his medications; this time for the last 5 days - explained that he's putting himself at risk of a stroke with his blood pressure being elevated - went to [facility] on 04/17/16  3: Obstructive sleep apnea- - now has his CPAP mask and has been wearing it for about the last month - wears it about 5-8 hours each night - says that he still is fatigued - remains uninsured  4: Diabetes- - glucose in the office this morning was 261 - no medication in the last 5 days  Patient did not bring his medications nor a list. Each medication was verbally reviewed with the patient and he was encouraged to bring the bottles to every visit to confirm accuracy of list.  Return in 1 month or sooner for any questions/problems before then.

## 2016-07-09 NOTE — Patient Instructions (Signed)
Continue weighing daily and call for an overnight weight gain of > 2 pounds or a weekly weight gain of >5 pounds. 

## 2016-07-10 ENCOUNTER — Ambulatory Visit: Payer: Self-pay | Admitting: Specialist

## 2016-07-10 ENCOUNTER — Other Ambulatory Visit: Payer: Self-pay | Admitting: Family

## 2016-07-10 DIAGNOSIS — M545 Low back pain: Secondary | ICD-10-CM

## 2016-07-10 MED ORDER — MELOXICAM 15 MG PO TABS: 15.0000 mg | ORAL_TABLET | Freq: Every day | ORAL | 2 refills | Status: DC

## 2016-07-10 MED ORDER — SIMVASTATIN 40 MG PO TABS: 40.0000 mg | ORAL_TABLET | Freq: Every day | ORAL | 3 refills | Status: DC

## 2016-07-10 NOTE — Progress Notes (Signed)
   Subjective:    Patient ID: Bradley Mckenzie., male    DOB: October 14, 1965, 51 y.o.   MRN: [id number]  HPI I reviewed the MRI from 06/19. He has mild disk bulges L4/L5,L5/ S1.    Review of Systems Deferred    Objective:   Physical Exam Deferred      Assessment & Plan:  I am suggesting a course of mobic and referral to Dr. Jonni Sanger for his evaluation and treatment. Return to clinic prn.

## 2016-07-10 NOTE — Progress Notes (Signed)
[facility] can assist patient with 30 day supply of medications. They have simvastatin so will change his lipid lowering therapy to simvastatin.

## 2016-07-11 ENCOUNTER — Ambulatory Visit: Payer: Self-pay | Admitting: Family Medicine

## 2016-07-11 VITALS — BP 163/89 | HR 80 | Temp 98.5°F | Wt 294.5 lb

## 2016-07-11 DIAGNOSIS — G4733 Obstructive sleep apnea (adult) (pediatric): Secondary | ICD-10-CM

## 2016-07-11 DIAGNOSIS — E119 Type 2 diabetes mellitus without complications: Secondary | ICD-10-CM

## 2016-07-11 LAB — GLUCOSE, POCT (MANUAL RESULT ENTRY): POC GLUCOSE: 117 mg/dL — AB (ref 70–99)

## 2016-07-11 MED ORDER — OMEPRAZOLE 20 MG PO CPDR: 20.0000 mg | DELAYED_RELEASE_CAPSULE | Freq: Every day | ORAL | 5 refills | Status: DC

## 2016-07-11 MED ORDER — METFORMIN HCL 1000 MG PO TABS: 500.0000 mg | ORAL_TABLET | Freq: Two times a day (BID) | ORAL | 5 refills | Status: DC

## 2016-07-11 NOTE — Progress Notes (Signed)
Subjective:     Patient ID: Bradley Pitts., male   DOB: 1965/07/30, 51 y.o.   MRN: [id number]  HPI Back pain,joint pain ,and recent heart burn.No exertional or associated symptoms.  Review of Systems See HPI.    Objective:   Physical Exam  163/92  98.5   80 COR--WNL Lungs-clear Abd-soft Ext - edema    Assessment:     TIIDM A1C 8.7 today.Diet and exercise discussed at length.Start Metformin 500mg  daily.Watch renal function. GERD Start omeprazole 20mg  q am. CHF Followed at CHF clinic. COPD HTN May need to increase coreg. HLD    Plan:     RTC 1 month. Consider changing PPI to Zantac.Consider stopping Glipizide.    Miguel Aschoff MD

## 2016-07-31 ENCOUNTER — Ambulatory Visit: Payer: Self-pay | Admitting: Chiropractor

## 2016-08-07 ENCOUNTER — Encounter: Payer: Self-pay | Admitting: Family

## 2016-08-07 ENCOUNTER — Ambulatory Visit: Payer: Self-pay | Attending: Family | Admitting: Family

## 2016-08-07 VITALS — BP 124/68 | HR 90 | Resp 18 | Ht 69.0 in | Wt 289.5 lb

## 2016-08-07 DIAGNOSIS — G4733 Obstructive sleep apnea (adult) (pediatric): Secondary | ICD-10-CM | POA: Insufficient documentation

## 2016-08-07 DIAGNOSIS — I429 Cardiomyopathy, unspecified: Secondary | ICD-10-CM | POA: Insufficient documentation

## 2016-08-07 DIAGNOSIS — I110 Hypertensive heart disease with heart failure: Secondary | ICD-10-CM | POA: Insufficient documentation

## 2016-08-07 DIAGNOSIS — I10 Essential (primary) hypertension: Secondary | ICD-10-CM

## 2016-08-07 DIAGNOSIS — I5022 Chronic systolic (congestive) heart failure: Secondary | ICD-10-CM

## 2016-08-07 DIAGNOSIS — Z7982 Long term (current) use of aspirin: Secondary | ICD-10-CM | POA: Insufficient documentation

## 2016-08-07 DIAGNOSIS — E785 Hyperlipidemia, unspecified: Secondary | ICD-10-CM | POA: Insufficient documentation

## 2016-08-07 DIAGNOSIS — E119 Type 2 diabetes mellitus without complications: Secondary | ICD-10-CM | POA: Insufficient documentation

## 2016-08-07 DIAGNOSIS — Z7984 Long term (current) use of oral hypoglycemic drugs: Secondary | ICD-10-CM | POA: Insufficient documentation

## 2016-08-07 DIAGNOSIS — I5042 Chronic combined systolic (congestive) and diastolic (congestive) heart failure: Secondary | ICD-10-CM | POA: Insufficient documentation

## 2016-08-07 DIAGNOSIS — F329 Major depressive disorder, single episode, unspecified: Secondary | ICD-10-CM | POA: Insufficient documentation

## 2016-08-07 DIAGNOSIS — Z87891 Personal history of nicotine dependence: Secondary | ICD-10-CM | POA: Insufficient documentation

## 2016-08-07 LAB — GLUCOSE, CAPILLARY: Glucose-Capillary: 146 mg/dL — ABNORMAL HIGH (ref 65–99)

## 2016-08-07 NOTE — Patient Instructions (Addendum)
Continue weighing daily and call for an overnight weight gain of > 2 pounds or a weekly weight gain of >5 pounds.  Continue metformin 1000mg  twice daily and stop glipizide. Watch glucose levels and if they start to rise, resume glipizide at 1/2 tablet daily.

## 2016-08-07 NOTE — Progress Notes (Signed)
Patient ID: Bradley Divis., male    DOB: 04-Aug-1965, 51 y.o.   MRN: [id number]  HPI  Bradley Mckenzie is a 51 y/o male who has a history of diabetes, HTN, obstructive sleep apnea, depression, hyperlipidemia, morbid obesity and heart failure with a reduced ejection fraction. Remote tobacco exposure.   Last echo was done 09/02/15 with an EF of 30-35%, mild mitral regurgitation and no aortic stenosis. EF has declined from 40-45% in March 2017 but he had been inconsistent taking his medications & not using his CPAP during this time.  Last admitted to [facility] 09/01/15 with acute exacerbation of HF and uncontrolled HTN. Was given IV diuretics and assistance with CPAP. Also started on glipizide.  Returns today for a follow-up visit with a chief complaint of mild shortness of breath with moderate exertion. He describes this as being chronic in nature and varies in severity. He has associated fatigue and cough along with this.   Past Medical History:  Diagnosis Date  . Cardiomyopathy ([place])    a. 01/2014 Echo: EF 25-30%;  b. ? ischemic vs non-ischemic.  He's never had an ischemic eval.  . Chronic combined systolic (congestive) and diastolic (congestive) heart failure ([place])    a. 01/2014 Echo: EF 25-30%. b. Echo 03/2015: Improved EF of 40-45%  . Depression   . Diabetes mellitus without complication ([place])    a. Not previously on outpt meds.  . Hyperlipidemia   . Hypertensive heart disease    a. Since his 16's.  . Morbid obesity ([place])   . Obstructive sleep apnea    a. Has not used CPAP since ~ 2012.   Past Surgical History:  Procedure Laterality Date  . HERNIA REPAIR  2004   Family History  Problem Relation Age of Onset  . Heart disease Mother        alive & well.  [name] Heart attack Father 30       died @ 24 of cancer.  . Diabetes Father   . Cancer Father        "in his stomach"  . Cancer Sister   . Cancer Paternal Uncle    Social History  Substance Use Topics  . Smoking status: Former Research scientist (life sciences)   . Smokeless tobacco: Never Used     Comment: Quit in his 20's.  . Alcohol use 0.0 oz/week     Comment: occassional alcohol use at special events   No Known Allergies  Prior to Admission medications   Medication Sig Start Date End Date Taking? Authorizing Provider  albuterol (PROVENTIL HFA;VENTOLIN HFA) 108 (90 Base) MCG/ACT inhaler Inhale 2 puffs into the lungs every 6 (six) hours as needed for wheezing or shortness of breath. Reported on 07/08/2015 Patient not taking: Reported on 07/09/2016 09/25/15   Alisa Graff, FNP  aspirin EC 81 MG tablet Take 1 tablet (81 mg total) by mouth daily. 07/09/16   Alisa Graff, FNP  carvedilol (COREG) 12.5 MG tablet Take 1 tablet (12.5 mg total) by mouth 2 (two) times daily with a meal. 07/09/16 08/08/16  Alisa Graff, FNP  furosemide (LASIX) 40 MG tablet Take 1 tablet (40 mg total) by mouth 2 (two) times daily. 07/09/16   Alisa Graff, FNP  glipiZIDE (GLUCOTROL) 10 MG tablet Take 1 tablet (10 mg total) by mouth daily before breakfast. 07/09/16   Alisa Graff, FNP  hydrALAZINE (APRESOLINE) 25 MG tablet Take 1 tablet (25 mg total) by mouth 2 (two) times daily. 07/09/16   Darylene Price  A, FNP  lisinopril (PRINIVIL,ZESTRIL) 20 MG tablet Take 2 tablets (40 mg total) by mouth daily. 07/09/16   Alisa Graff, FNP  lovastatin (MEVACOR) 20 MG tablet Take 1 tablet (20 mg total) by mouth at bedtime. D/C 40mg  dosage Patient not taking: Reported on 07/09/2016 05/20/16   Alisa Graff, FNP  spironolactone (ALDACTONE) 25 MG tablet Take 2 tablets (50 mg total) by mouth daily. 07/09/16   Alisa Graff, FNP     Review of Systems  Constitutional: Positive for fatigue (improving). Negative for appetite change.  HENT: Positive for congestion. Negative for postnasal drip.   Eyes: Negative.   Respiratory: Positive for cough and shortness of breath (at times). Negative for chest tightness.   Cardiovascular: Positive for leg swelling. Negative for chest pain and  palpitations.  Gastrointestinal: Negative for abdominal distention, abdominal pain and nausea.  Endocrine: Negative.   Genitourinary: Negative.   Musculoskeletal: Positive for back pain. Negative for neck pain.  Skin: Negative.   Allergic/Immunologic: Negative.   Neurological: Negative for dizziness and light-headedness.  Hematological: Negative for adenopathy. Does not bruise/bleed easily.  Psychiatric/Behavioral: Negative for dysphoric mood, sleep disturbance (wearing CPAP nightly for the last month) and suicidal ideas. The patient is not nervous/anxious.    Vitals:   08/07/16 1115  BP: 124/68  Pulse: 90  Resp: 18  SpO2: 97%  Weight: 289 lb 8 oz (131.3 kg)  Height: 5\' 9"  (1.753 m)   Wt Readings from Last 3 Encounters:  08/07/16 289 lb 8 oz (131.3 kg)  07/11/16 294 lb 8 oz (133.6 kg)  07/09/16 295 lb 2 oz (133.9 kg)   Lab Results  Component Value Date   CREATININE 1.35 (H) 07/04/2016   CREATININE 1.39 (H) 03/14/2016   CREATININE 1.27 12/12/2015   Physical Exam  Constitutional: He is oriented to person, place, and time. He appears well-developed and well-nourished.  HENT:  Head: Normocephalic and atraumatic.  Neck: Normal range of motion. Neck supple. No JVD present.  Cardiovascular: Normal rate and regular rhythm.   Pulmonary/Chest: Effort normal. He has no wheezes. He has no rales.  Abdominal: Soft. He exhibits no distension. There is no tenderness.  Musculoskeletal: He exhibits no edema or tenderness.  Neurological: He is alert and oriented to person, place, and time.  Skin: Skin is warm and dry.  Psychiatric: He has a normal mood and affect. His behavior is normal.  Nursing note and vitals reviewed.   Assessment & Plan:  1: Chronic heart failure with reduced ejection fraction-  - NYHA Class II - euvolemic today - by our scale, he has lost 6 pounds since he was last here. Reminded to call for an overnight weight gain of >2 pounds or a weekly weight gain of >5  pounds - Could consider titrating up carvedilol in the future - Could consider switching his lisinopril to entresto if he would get established with medication management clinic - trying to eat low sodium foods when he can - says that he's going to medication management clinic today as he's lost the paperwork - ReDS vest reading not done due to BMI  2: HTN- - BP controlled today as he's been on his medications consistently - went to [facility] on 07/11/16  3: Obstructive sleep apnea- - wears it about 5-8 hours each night - says that he still is fatigued although it's improving - remains uninsured  4: Diabetes- - glucose in the office this morning was 146 fasting - has been taking 1000mg   metformin BID along with 10mg  glipizide - checks his glucose inconsistently but says that he's had episodes of where he feels like it's low - per [facility]'s note, looks like the plan was to possibly stop the glipizide - advised him to stop his glipizide and continue metformin 1000mg  BID - check glucose more consistently and if it begins to rise, resume glipizide at 5mg  daily   Patient did not bring his medications nor a list. Each medication was verbally reviewed with the patient and he was encouraged to bring the bottles to every visit to confirm accuracy of list.  Return in 1 month or sooner for any questions/problems before then.

## 2016-08-15 ENCOUNTER — Ambulatory Visit: Payer: Self-pay | Admitting: Adult Health Nurse Practitioner

## 2016-08-15 VITALS — BP 123/65 | HR 90 | Temp 97.7°F | Wt 284.2 lb

## 2016-08-15 DIAGNOSIS — I10 Essential (primary) hypertension: Secondary | ICD-10-CM

## 2016-08-15 DIAGNOSIS — E119 Type 2 diabetes mellitus without complications: Secondary | ICD-10-CM

## 2016-08-15 LAB — GLUCOSE, POCT (MANUAL RESULT ENTRY)
POC GLUCOSE: 94 mg/dL (ref 70–99)
POC GLUCOSE: 94 mg/dL (ref 70–99)

## 2016-08-15 NOTE — Progress Notes (Signed)
Subjective:    Patient ID: Bradley Mckenzie., male    DOB: Dec 15, 1965, 51 y.o.   MRN: [id number]  HPI   Pt here for f/u. Pt reports his heart burn has improved.  Taking omeprazole PRN.   Taking Metformin 500mg  daily, stopped glipizide.   BP improved from previous visit.   Patient Active Problem List   Diagnosis Date Noted  . Gynecomastia 12/22/2015  . Dental caries 12/12/2015  . Medication monitoring encounter 12/12/2015  . Low back pain 12/12/2015  . Chronic kidney disease, stage II (mild)   . OSA on CPAP   . Uncontrolled hypertension 07/08/2015  . Cardiomyopathy ([place])   . Hyperlipidemia   . Morbid obesity ([place])   . Diabetes mellitus without complication ([place])   . Hypertensive heart disease   . Sinusitis 11/11/2014  . Tachycardia 09/12/2014  . COPD (chronic obstructive pulmonary disease) ([place]) 08/05/2014  . Chronic systolic heart failure ([place]) 06/16/2014  . Obstructive sleep apnea 06/16/2014  . Essential hypertension 06/16/2014  . H/O respiratory system disease 02/24/2014   Allergies as of 08/15/2016   No Known Allergies     Medication List       Accurate as of 08/15/16  5:49 PM. Always use your most recent med list.          albuterol 108 (90 Base) MCG/ACT inhaler Commonly known as:  PROVENTIL HFA;VENTOLIN HFA Inhale 2 puffs into the lungs every 6 (six) hours as needed for wheezing or shortness of breath. Reported on 07/08/2015   aspirin EC 81 MG tablet Take 1 tablet (81 mg total) by mouth daily.   carvedilol 12.5 MG tablet Commonly known as:  COREG Take 1 tablet (12.5 mg total) by mouth 2 (two) times daily with a meal.   furosemide 40 MG tablet Commonly known as:  LASIX Take 1 tablet (40 mg total) by mouth 2 (two) times daily.   glipiZIDE 10 MG tablet Commonly known as:  GLUCOTROL Take 1 tablet (10 mg total) by mouth daily before breakfast.   hydrALAZINE 25 MG tablet Commonly known as:  APRESOLINE Take 1 tablet (25 mg total) by mouth 2 (two) times  daily.   lisinopril 20 MG tablet Commonly known as:  PRINIVIL,ZESTRIL Take 2 tablets (40 mg total) by mouth daily.   metFORMIN 1000 MG tablet Commonly known as:  GLUCOPHAGE Take 0.5 tablets (500 mg total) by mouth 2 (two) times daily with a meal.   omeprazole 20 MG capsule Commonly known as:  PRILOSEC Take 1 capsule (20 mg total) by mouth daily.   simvastatin 40 MG tablet Commonly known as:  ZOCOR Take 1 tablet (40 mg total) by mouth at bedtime.   spironolactone 25 MG tablet Commonly known as:  ALDACTONE Take 2 tablets (50 mg total) by mouth daily.        Review of Systems  All other systems reviewed and are negative.     Objective:   Physical Exam  Constitutional: He is oriented to person, place, and time. He appears well-developed and well-nourished.  Cardiovascular: Normal rate, regular rhythm and normal heart sounds.   Pulmonary/Chest: Effort normal and breath sounds normal.  Abdominal: Soft. Bowel sounds are normal.  Neurological: He is alert and oriented to person, place, and time.  Vitals reviewed.   BP 123/65   Pulse 90   Temp 97.7 F (36.5 C)   Wt 284 lb 3.2 oz (128.9 kg)   BMI 41.97 kg/m       Assessment & Plan:  Labs today: BMet F/u in 2 mo.  Continue Metformin.  Omeprazole PRN.

## 2016-08-16 LAB — BASIC METABOLIC PANEL
BUN/Creatinine Ratio: 10 (ref 9–20)
BUN: 15 mg/dL (ref 6–24)
CALCIUM: 10.2 mg/dL (ref 8.7–10.2)
CHLORIDE: 99 mmol/L (ref 96–106)
CO2: 24 mmol/L (ref 20–29)
Creatinine, Ser: 1.47 mg/dL — ABNORMAL HIGH (ref 0.76–1.27)
GFR calc Af Amer: 64 mL/min/{1.73_m2} (ref >59)
GFR calc non Af Amer: 55 mL/min/{1.73_m2} — ABNORMAL LOW (ref >59)
GLUCOSE: 86 mg/dL (ref 65–99)
POTASSIUM: 4.3 mmol/L (ref 3.5–5.2)
SODIUM: 140 mmol/L (ref 134–144)

## 2016-08-28 ENCOUNTER — Ambulatory Visit: Payer: Self-pay | Admitting: Chiropractor

## 2016-08-28 DIAGNOSIS — M9901 Segmental and somatic dysfunction of cervical region: Secondary | ICD-10-CM

## 2016-08-28 DIAGNOSIS — G8929 Other chronic pain: Secondary | ICD-10-CM

## 2016-08-28 DIAGNOSIS — M545 Low back pain: Principal | ICD-10-CM

## 2016-08-28 DIAGNOSIS — M9903 Segmental and somatic dysfunction of lumbar region: Secondary | ICD-10-CM | POA: Insufficient documentation

## 2016-08-28 DIAGNOSIS — M62830 Muscle spasm of back: Secondary | ICD-10-CM | POA: Insufficient documentation

## 2016-08-28 NOTE — Progress Notes (Signed)
S: Patient presents with central low back pain since Thanksgiving of 2017. Patient states he has had the issue since 1991 however the pain was always off and on. Pain has been constant since Thanksgiving. Patient is unable to identify what caused the flare up and states it just slowly got worse. Stretching can occasionally make the pain better. Standing, walking, and doing the dishes makes the pain worse. Pain is an 8/10. Pain is dull in the low back with radiating burning pain into the posterior thighs. Radiating pain stops at the knee. Pain is constant, neither worse in AM or PM. No loss of bowel or bladder control.   O: Posture - Right cervical flexion, left high shoulder, right high hip, anterior head carriage.  ROM - PROM WNL in Csp. PROM WNL for Lsp extension, R lat flexion, L and R rotation. PROM decreased with pain for Lsp flexion and L lat flexion.  Segmental Dysfunction- C2 R, C4 L, T8 R, L4 R, SI R.  Muscle Spasms - bilateral Csp paraspinals, right Tsp and Lsp paraspinals.  Ortho- Negative bilateral Kemps, SLR, Braggards, Yeomans, Hibbs, Valsalva  Neuro- DTR L4-S1 2; Motor L4-S1 5; Sensory L4-S1 normal bilateral.  A: Guarded. 1x EOW. Subacute phase. This is patient's initial visit.  P: SMT at segments listed above. Patient received care without incident. ROF completed prior to Tx. PARNB.

## 2016-09-04 ENCOUNTER — Other Ambulatory Visit: Payer: Self-pay | Admitting: Family

## 2016-09-04 ENCOUNTER — Encounter: Payer: Self-pay | Admitting: Family

## 2016-09-04 ENCOUNTER — Ambulatory Visit: Payer: Self-pay | Attending: Family | Admitting: Family

## 2016-09-04 VITALS — BP 168/62 | HR 93 | Resp 18 | Ht 69.0 in | Wt 288.1 lb

## 2016-09-04 DIAGNOSIS — Z87891 Personal history of nicotine dependence: Secondary | ICD-10-CM | POA: Insufficient documentation

## 2016-09-04 DIAGNOSIS — Z809 Family history of malignant neoplasm, unspecified: Secondary | ICD-10-CM | POA: Insufficient documentation

## 2016-09-04 DIAGNOSIS — G4733 Obstructive sleep apnea (adult) (pediatric): Secondary | ICD-10-CM | POA: Insufficient documentation

## 2016-09-04 DIAGNOSIS — I5042 Chronic combined systolic (congestive) and diastolic (congestive) heart failure: Secondary | ICD-10-CM | POA: Insufficient documentation

## 2016-09-04 DIAGNOSIS — I10 Essential (primary) hypertension: Secondary | ICD-10-CM

## 2016-09-04 DIAGNOSIS — Z8249 Family history of ischemic heart disease and other diseases of the circulatory system: Secondary | ICD-10-CM | POA: Insufficient documentation

## 2016-09-04 DIAGNOSIS — E119 Type 2 diabetes mellitus without complications: Secondary | ICD-10-CM | POA: Insufficient documentation

## 2016-09-04 DIAGNOSIS — I110 Hypertensive heart disease with heart failure: Secondary | ICD-10-CM | POA: Insufficient documentation

## 2016-09-04 DIAGNOSIS — Z9889 Other specified postprocedural states: Secondary | ICD-10-CM | POA: Insufficient documentation

## 2016-09-04 DIAGNOSIS — Z800 Family history of malignant neoplasm of digestive organs: Secondary | ICD-10-CM | POA: Insufficient documentation

## 2016-09-04 DIAGNOSIS — Z6841 Body Mass Index (BMI) 40.0 and over, adult: Secondary | ICD-10-CM | POA: Insufficient documentation

## 2016-09-04 DIAGNOSIS — Z7982 Long term (current) use of aspirin: Secondary | ICD-10-CM | POA: Insufficient documentation

## 2016-09-04 DIAGNOSIS — I5022 Chronic systolic (congestive) heart failure: Secondary | ICD-10-CM

## 2016-09-04 DIAGNOSIS — E785 Hyperlipidemia, unspecified: Secondary | ICD-10-CM | POA: Insufficient documentation

## 2016-09-04 DIAGNOSIS — I429 Cardiomyopathy, unspecified: Secondary | ICD-10-CM | POA: Insufficient documentation

## 2016-09-04 DIAGNOSIS — Z833 Family history of diabetes mellitus: Secondary | ICD-10-CM | POA: Insufficient documentation

## 2016-09-04 MED ORDER — CARVEDILOL 12.5 MG PO TABS: 12.5000 mg | ORAL_TABLET | Freq: Two times a day (BID) | ORAL | 3 refills | Status: DC

## 2016-09-04 NOTE — Progress Notes (Signed)
Patient ID: Bradley Mckenzie., male    DOB: Aug 10, 1965, 51 y.o.   MRN: [id number]  HPI  [name] is a 51 y/o male who has a history of diabetes, HTN, obstructive sleep apnea, depression, hyperlipidemia, morbid obesity and heart failure with a reduced ejection fraction. Remote tobacco exposure.   Last echo was done 09/02/15 with an EF of 30-35%, mild mitral regurgitation and no aortic stenosis. EF has declined from 40-45% in March 2017 but he had been inconsistent taking his medications & not using his CPAP during this time.  Last admitted to [facility] 09/01/15 with acute exacerbation of HF and uncontrolled HTN. Was given IV diuretics and assistance with CPAP. Also started on glipizide.  Returns today for a follow-up visit with a chief complaint of mild shortness of breath upon moderate exertion. Describes this as chronic in nature having been present for several years. Does feel like it's been worsening over the last week as he's been out of all his medications. Has associated fatigue, cough and edema along with this. Denies any dizziness or weight gain.   Past Medical History:  Diagnosis Date  . Cardiomyopathy ([place])    a. 01/2014 Echo: EF 25-30%;  b. ? ischemic vs non-ischemic.  He's never had an ischemic eval.  . Chronic combined systolic (congestive) and diastolic (congestive) heart failure ([place])    a. 01/2014 Echo: EF 25-30%. b. Echo 03/2015: Improved EF of 40-45%  . Depression   . Diabetes mellitus without complication ([place])    a. Not previously on outpt meds.  . Hyperlipidemia   . Hypertensive heart disease    a. Since his 62's.  . Morbid obesity ([place])   . Obstructive sleep apnea    a. Has not used CPAP since ~ 2012.   Past Surgical History:  Procedure Laterality Date  . HERNIA REPAIR  2004   Family History  Problem Relation Age of Onset  . Heart disease Mother        alive & well.  [name] Heart attack Father 38       died @ 66 of cancer.  . Diabetes Father   . Cancer Father    "in his stomach"  . Cancer Sister   . Cancer Paternal Uncle    Social History  Substance Use Topics  . Smoking status: Former Research scientist (life sciences)  . Smokeless tobacco: Never Used     Comment: Quit in his 20's.  . Alcohol use 0.0 oz/week     Comment: occassional alcohol use at special events   No Known Allergies  Prior to Admission medications   Medication Sig Start Date End Date Taking? Authorizing Provider  aspirin EC 81 MG tablet Take 1 tablet (81 mg total) by mouth daily. 07/09/16  Yes Tosca Pletz, Otila Kluver A, FNP  albuterol (PROVENTIL HFA;VENTOLIN HFA) 108 (90 Base) MCG/ACT inhaler Inhale 2 puffs into the lungs every 6 (six) hours as needed for wheezing or shortness of breath. Reported on 07/08/2015 Patient not taking: Reported on 09/04/2016 09/25/15   Alisa Graff, FNP  carvedilol (COREG) 12.5 MG tablet Take 1 tablet (12.5 mg total) by mouth 2 (two) times daily with a meal. Patient not taking: Reported on 09/05/2016 09/04/16 10/04/16  Alisa Graff, FNP  furosemide (LASIX) 40 MG tablet Take 1 tablet (40 mg total) by mouth 2 (two) times daily. Patient not taking: Reported on 09/04/2016 07/09/16   Darylene Price A, FNP  hydrALAZINE (APRESOLINE) 25 MG tablet Take 1 tablet (25 mg total) by mouth 2 (two)  times daily. Patient not taking: Reported on 09/04/2016 07/09/16   Darylene Price A, FNP  lisinopril (PRINIVIL,ZESTRIL) 20 MG tablet Take 2 tablets (40 mg total) by mouth daily. Patient not taking: Reported on 09/04/2016 07/09/16   Alisa Graff, FNP  omeprazole (PRILOSEC) 20 MG capsule Take 1 capsule (20 mg total) by mouth daily. Patient not taking: Reported on 09/04/2016 07/11/16   Jerrol Banana., MD  simvastatin (ZOCOR) 40 MG tablet Take 1 tablet (40 mg total) by mouth at bedtime. Patient not taking: Reported on 09/04/2016 07/10/16 08/09/16  Alisa Graff, FNP  spironolactone (ALDACTONE) 25 MG tablet Take 2 tablets (50 mg total) by mouth daily. Patient not taking: Reported on 09/04/2016 07/09/16   Alisa Graff, FNP    Review of Systems  Constitutional: Positive for fatigue. Negative for appetite change.  HENT: Positive for congestion. Negative for postnasal drip.   Eyes: Negative.   Respiratory: Positive for cough and shortness of breath (worse since not being on medications). Negative for chest tightness.   Cardiovascular: Positive for leg swelling (minimal). Negative for chest pain and palpitations.  Gastrointestinal: Negative for abdominal distention and nausea.  Endocrine: Negative.   Genitourinary: Negative.   Musculoskeletal: Positive for back pain (better ). Negative for neck pain.  Skin: Negative.   Allergic/Immunologic: Negative.   Neurological: Negative for dizziness and light-headedness.  Hematological: Negative for adenopathy. Does not bruise/bleed easily.  Psychiatric/Behavioral: Negative for dysphoric mood, sleep disturbance (wearing CPAP nightly for the last month) and suicidal ideas. The patient is not nervous/anxious.    Vitals:   09/04/16 1108  BP: (!) 168/62  Pulse: 93  Resp: 18  SpO2: 97%  Weight: 288 lb 2 oz (130.7 kg)  Height: 5\' 9"  (1.753 m)   Wt Readings from Last 3 Encounters:  09/04/16 288 lb 2 oz (130.7 kg)  08/15/16 284 lb 3.2 oz (128.9 kg)  08/07/16 289 lb 8 oz (131.3 kg)   Lab Results  Component Value Date   CREATININE 1.47 (H) 08/15/2016   CREATININE 1.35 (H) 07/04/2016   CREATININE 1.39 (H) 03/14/2016   Physical Exam  Constitutional: He is oriented to person, place, and time. He appears well-developed and well-nourished.  HENT:  Head: Normocephalic and atraumatic.  Neck: Normal range of motion. Neck supple. No JVD present.  Cardiovascular: Normal rate and regular rhythm.   Pulmonary/Chest: Effort normal. He has no wheezes. He has no rales.  Abdominal: Soft. He exhibits no distension. There is no tenderness.  Musculoskeletal: He exhibits edema (trace edema bilateral lower legs). He exhibits no tenderness.  Neurological: He is alert and  oriented to person, place, and time.  Skin: Skin is warm and dry.  Psychiatric: He has a normal mood and affect. His behavior is normal.  Nursing note and vitals reviewed.   Assessment & Plan:  1: Chronic heart failure with reduced ejection fraction-  - NYHA Class II - euvolemic today - no longer has working scales and he's tried changing out the batteries. Scales provided today and he was instructed to call for an overnight weight gain of >2 pounds or a weekly weight gain of >5 pounds - trying to eat low sodium foods when he can - says that he's going to medication management clinic today to see if they can help fill medications - unsure of fluid intake as he's not been measuring it. Instructed that he needs to keep fluid intake to close to 60 ounces daily.  - ReDS vest reading not  done due to BMI - has been out of all medications for the last week due to finances; established with [facility] and advised him to ask them for assistance with his medications as well.   2: HTN- - BP rising back up due to being unable to afford to get his medications over the last week - went to [facility] on 08/15/16 and returns 09/11/16  3: Obstructive sleep apnea- - resumed it last night after missing previous 4 days due to cough/congestion - says that he still is fatigued although it's improving - remains uninsured  Patient did not bring his medications nor a list. Each medication was verbally reviewed with the patient and he was encouraged to bring the bottles to every visit to confirm accuracy of list.  Return in 3 months or sooner for any questions/problems before then.

## 2016-09-04 NOTE — Patient Instructions (Addendum)
Continue weighing daily and call for an overnight weight gain of > 2 pounds or a weekly weight gain of >5 pounds.  Decrease fluid consumption to 60 ounces daily.

## 2016-09-11 ENCOUNTER — Ambulatory Visit: Payer: Self-pay | Admitting: Chiropractor

## 2016-09-11 DIAGNOSIS — M9901 Segmental and somatic dysfunction of cervical region: Secondary | ICD-10-CM

## 2016-09-11 DIAGNOSIS — G8929 Other chronic pain: Secondary | ICD-10-CM

## 2016-09-11 DIAGNOSIS — M62830 Muscle spasm of back: Secondary | ICD-10-CM

## 2016-09-11 DIAGNOSIS — M545 Low back pain: Secondary | ICD-10-CM

## 2016-09-11 DIAGNOSIS — M9903 Segmental and somatic dysfunction of lumbar region: Secondary | ICD-10-CM

## 2016-09-11 NOTE — Progress Notes (Signed)
S: Patient states he was sore for about a day after LPV. Then he started to feel improvement from his usual pain until this past weekend when he aggravated his low back. Patient presents with central low back pain. Stretching can occasionally make the pain better. Standing, walking, and doing the dishes makes the pain worse. Pain is an 8/10. Pain is dull in the low back with radiating burning pain into the posterior thighs. Radiating pain stops at the knee. Pain is constant, neither worse in AM or PM. No loss of bowel or bladder control.   O: Posture - Right cervical flexion, left high shoulder, right high hip, anterior head carriage.  ROM - PROM WNL in Csp. PROM WNL for Lsp extension, R lat flexion, L and R rotation. PROM decreased with pain for Lsp flexion and L lat flexion.  Segmental Dysfunction- C2 R, C4 L, T8 R, L4 R, SI R.  Muscle Spasms - bilateral Csp paraspinals, right Tsp and Lsp paraspinals.  Ortho- Negative bilateral Kemps, SLR, Braggards, Yeomans, Hibbs, Valsalva  Neuro- DTR L4-S1 2; Motor L4-S1 5; Sensory L4-S1 normal bilateral.  A: Guarded. 1x EOW. Subacute phase. Patient is progressingas expected.  P: SMT at segments listed above.

## 2016-09-18 IMAGING — DX DG CHEST 1V PORT
1 series · 1 of 1 positions shown · non-contrast
Comparison: July 09, 2015

CLINICAL DATA: Shortness of breath for several days

EXAM:
PORTABLE CHEST 1 VIEW

[chest ap]
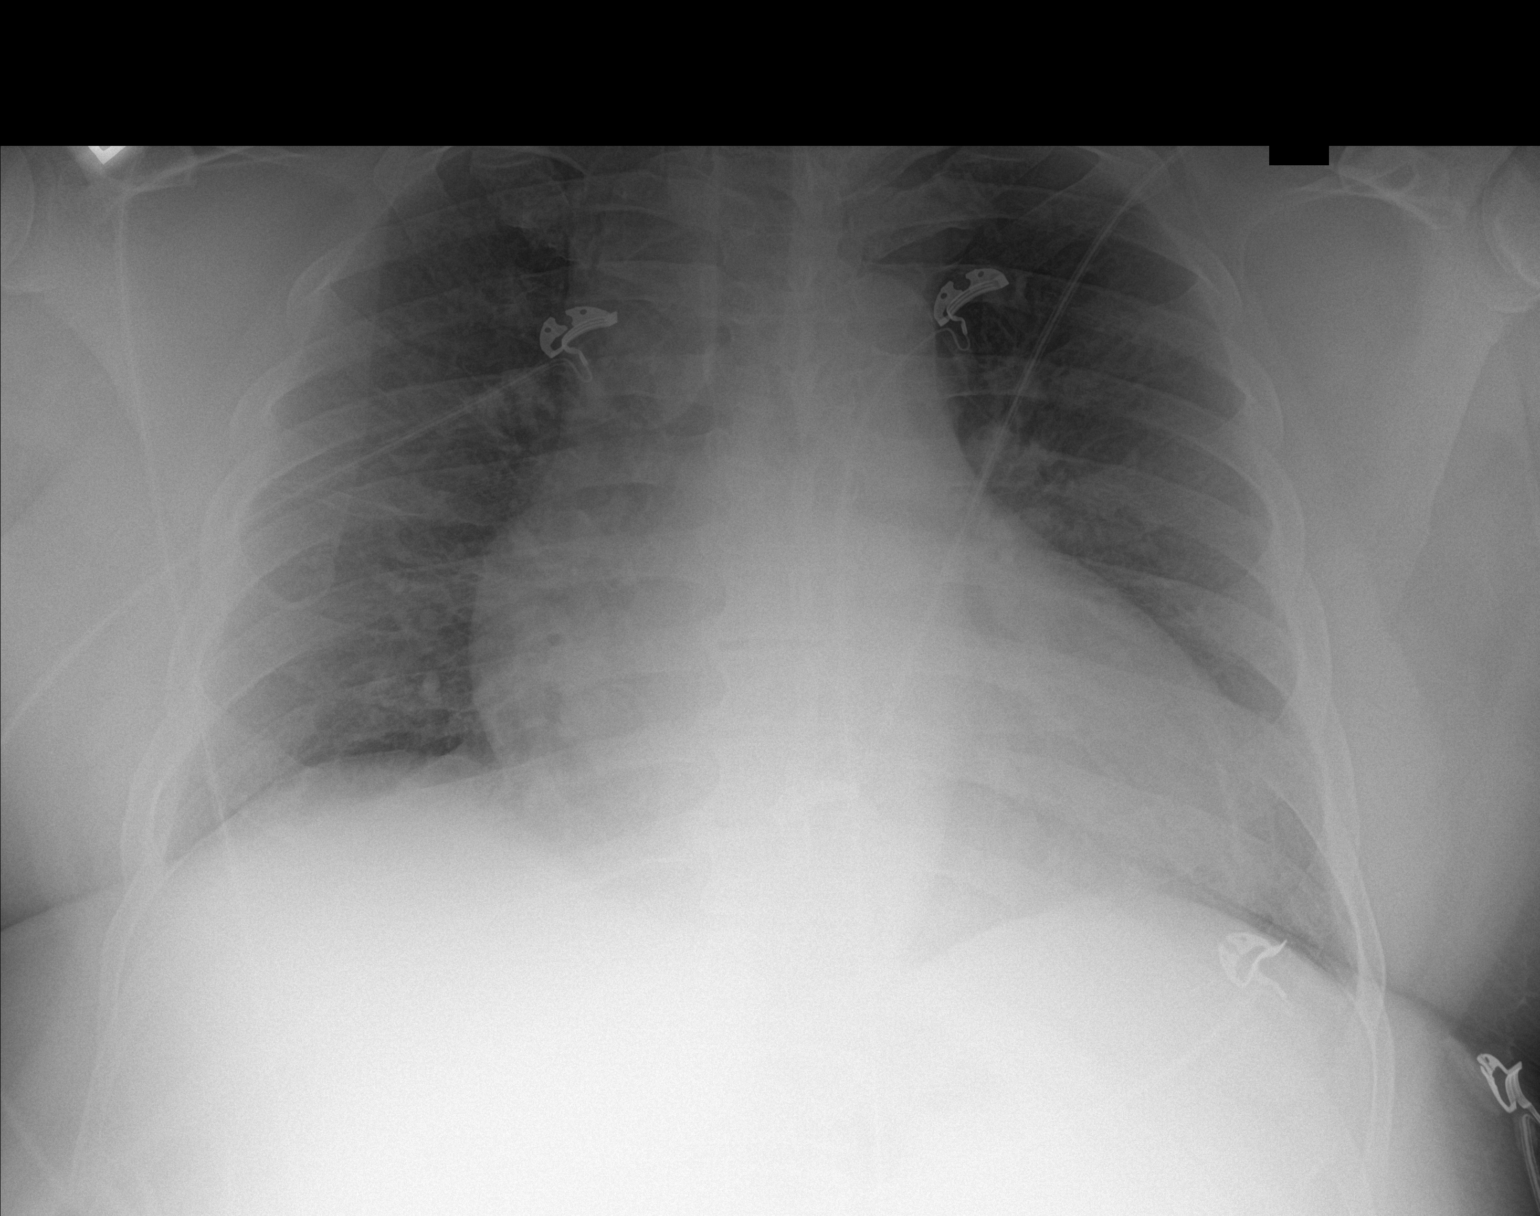

[1 of 1 positions shown; findings below may reference images not displayed]

FINDINGS: There is no edema or consolidation. There is generalized
cardiomegaly. Pulmonary vascularity is normal. No adenopathy. No
bone lesions.
IMPRESSION: Cardiomegaly.  No edema or consolidation.

## 2016-09-19 ENCOUNTER — Ambulatory Visit: Payer: Self-pay | Admitting: Adult Health Nurse Practitioner

## 2016-09-19 ENCOUNTER — Telehealth: Payer: Self-pay | Admitting: Pharmacy Technician

## 2016-09-19 VITALS — BP 111/69 | Temp 97.9°F | Resp 18 | Wt 282.3 lb

## 2016-09-19 DIAGNOSIS — K30 Functional dyspepsia: Secondary | ICD-10-CM

## 2016-09-19 DIAGNOSIS — E118 Type 2 diabetes mellitus with unspecified complications: Secondary | ICD-10-CM

## 2016-09-19 LAB — GLUCOSE, POCT (MANUAL RESULT ENTRY): POC GLUCOSE: 103 mg/dL — AB (ref 70–99)

## 2016-09-19 NOTE — Telephone Encounter (Signed)
Patient eligible to receive medication assistance at [facility] through 2018, as long as eligibility requirements continue to be met.  Tippi Mccrae J. Bruin Bolger Care Manager [facility] 

## 2016-09-19 NOTE — Progress Notes (Signed)
Subjective:    Patient ID: Bradley Pitts., male    DOB: May 31, 1965, 51 y.o.   MRN: [id number]  HPI   Pt is here for previous abdominal pain - reports it is better now. Pt reports it felt unsettled for 1 week.   Patient Active Problem List   Diagnosis Date Noted  . Somatic dysfunction of spine, lumbar 08/28/2016  . Segmental dysfunction of cervical region 08/28/2016  . Muscle spasm of back 08/28/2016  . Gynecomastia 12/22/2015  . Dental caries 12/12/2015  . Medication monitoring encounter 12/12/2015  . Low back pain 12/12/2015  . Chronic kidney disease, stage II (mild)   . OSA on CPAP   . Uncontrolled hypertension 07/08/2015  . Cardiomyopathy ([place])   . Hyperlipidemia   . Morbid obesity ([place])   . Diabetes mellitus without complication ([place])   . Hypertensive heart disease   . Sinusitis 11/11/2014  . Tachycardia 09/12/2014  . COPD (chronic obstructive pulmonary disease) ([place]) 08/05/2014  . Chronic systolic heart failure ([place]) 06/16/2014  . Obstructive sleep apnea 06/16/2014  . Essential hypertension 06/16/2014  . H/O respiratory system disease 02/24/2014   Allergies as of 09/19/2016   No Known Allergies     Medication List       Accurate as of 09/19/16  7:04 PM. Always use your most recent med list.          albuterol 108 (90 Base) MCG/ACT inhaler Commonly known as:  PROVENTIL HFA;VENTOLIN HFA Inhale 2 puffs into the lungs every 6 (six) hours as needed for wheezing or shortness of breath. Reported on 07/08/2015   aspirin EC 81 MG tablet Take 1 tablet (81 mg total) by mouth daily.   carvedilol 12.5 MG tablet Commonly known as:  COREG Take 1 tablet (12.5 mg total) by mouth 2 (two) times daily with a meal.   furosemide 40 MG tablet Commonly known as:  LASIX Take 1 tablet (40 mg total) by mouth 2 (two) times daily.   hydrALAZINE 25 MG tablet Commonly known as:  APRESOLINE Take 1 tablet (25 mg total) by mouth 2 (two) times daily.   lisinopril 20 MG  tablet Commonly known as:  PRINIVIL,ZESTRIL Take 2 tablets (40 mg total) by mouth daily.   omeprazole 20 MG capsule Commonly known as:  PRILOSEC Take 1 capsule (20 mg total) by mouth daily.   simvastatin 40 MG tablet Commonly known as:  ZOCOR Take 1 tablet (40 mg total) by mouth at bedtime.   spironolactone 25 MG tablet Commonly known as:  ALDACTONE Take 2 tablets (50 mg total) by mouth daily.            Discharge Care Instructions        Start     Ordered   09/19/16 0000  POCT Glucose (CBG)     09/19/16 1833       Review of Systems   Pt denies nausea, diarrhea, vomiting, constipation or hematochezia. Pt reports green stools. Pt drinks grape flavored water.  Advised pt to eat bland foods if stomach feels unsettled again.     Objective:   Physical Exam  Constitutional: He is oriented to person, place, and time. He appears well-developed and well-nourished.  Cardiovascular: Normal rate, regular rhythm and normal heart sounds.   Pulmonary/Chest: Effort normal and breath sounds normal.  Abdominal: Soft. Bowel sounds are normal.  Neurological: He is alert and oriented to person, place, and time.    BP 111/69 (BP Location: Left Arm, Patient Position: Sitting, Cuff  Size: Large)   Temp 97.9 F (36.6 C)   Resp 18   Wt 282 lb 4.8 oz (128.1 kg)   BMI 41.69 kg/m        Assessment & Plan:   Advised BRAT diet if reoccurs.  Will obtain imaging if the problem reoccurs.   FU as directed.

## 2016-09-25 ENCOUNTER — Ambulatory Visit: Payer: Self-pay | Admitting: Chiropractor

## 2016-09-25 DIAGNOSIS — G8929 Other chronic pain: Secondary | ICD-10-CM

## 2016-09-25 DIAGNOSIS — M545 Low back pain: Secondary | ICD-10-CM

## 2016-09-25 DIAGNOSIS — M9901 Segmental and somatic dysfunction of cervical region: Secondary | ICD-10-CM

## 2016-09-25 DIAGNOSIS — M62830 Muscle spasm of back: Secondary | ICD-10-CM

## 2016-09-25 DIAGNOSIS — M9903 Segmental and somatic dysfunction of lumbar region: Secondary | ICD-10-CM

## 2016-09-25 NOTE — Progress Notes (Signed)
S: Patient states he did not feel any better since LPV. Patient presents with central low back pain. Stretching can occasionally make the pain better. Standing, walking, and doing the dishes makes the pain worse. Pain is an 8/10. Pain is dull in the low back with radiating burning pain into the posterior thighs. Radiating pain stops at the knee. Pain is constant, neither worse in AM or PM. No loss of bowel or bladder control.   O: Posture - Right cervical flexion, left high shoulder, right high hip, anterior head carriage.  ROM - PROM WNL in Csp. PROM WNL for Lsp extension, R lat flexion, L and R rotation. PROM decreased with pain for Lsp flexion and L lat flexion.  Segmental Dysfunction- C2 R, C4 L, T8 R, L4 R, SI R.  Muscle Spasms - bilateral Csp paraspinals, right Tsp and Lsp paraspinals.  Ortho- Negative bilateral Kemps, SLR, Braggards, Yeomans, Hibbs, Valsalva  Neuro- DTR L4-S1 2; Motor L4-S1 5; Sensory L4-S1 normal bilateral.  A: Guarded. 1x EOW. Subacute phase. Patient is progressing slower than expected.  P: SMT at segments listed above.

## 2016-10-03 ENCOUNTER — Ambulatory Visit: Payer: Self-pay | Admitting: Ophthalmology

## 2016-10-09 ENCOUNTER — Ambulatory Visit: Payer: Self-pay | Admitting: Chiropractor

## 2016-10-09 DIAGNOSIS — M9903 Segmental and somatic dysfunction of lumbar region: Secondary | ICD-10-CM

## 2016-10-09 DIAGNOSIS — M545 Low back pain: Secondary | ICD-10-CM

## 2016-10-09 DIAGNOSIS — M9901 Segmental and somatic dysfunction of cervical region: Secondary | ICD-10-CM

## 2016-10-09 DIAGNOSIS — M62830 Muscle spasm of back: Secondary | ICD-10-CM

## 2016-10-09 DIAGNOSIS — G8929 Other chronic pain: Secondary | ICD-10-CM

## 2016-10-09 NOTE — Progress Notes (Signed)
S: Patient states he did not feel any better since LPV. Patient presents with central low back pain. Stretching can occasionally make the pain better. Standing, walking, and doing the dishes makes the pain worse. Pain is an 8/10. Pain is dull in the low back with radiating burning pain into the posterior thighs. Radiating pain stops at the knee. Pain is constant, neither worse in AM or PM. No loss of bowel or bladder control.   O: Posture - Right cervical flexion, left high shoulder, right high hip, anterior head carriage.  ROM - PROM WNL in Csp. PROM WNL for Lsp extension, R lat flexion, L and R rotation. PROM decreased with pain for Lsp flexion and L lat flexion.  Segmental Dysfunction- C2 R, C4 L, T8 R, L4 R, SI R.  Muscle Spasms - bilateral Csp paraspinals, right Tsp and Lsp paraspinals.  Ortho- Negative bilateral Kemps, SLR, Braggards, Yeomans, Hibbs, Valsalva  Neuro- DTR L4-S1 2; Motor L4-S1 5; Sensory L4-S1 normal bilateral.  A: Guarded. 1x EOW. Subacute phase. Patient is progressing slower than expected.  P: SMT at segments listed above. Scheduled a visit next week with Dr. Vickki Hearing due to lack of progress.

## 2016-10-16 ENCOUNTER — Ambulatory Visit: Payer: Self-pay | Admitting: Specialist

## 2016-10-16 DIAGNOSIS — M549 Dorsalgia, unspecified: Secondary | ICD-10-CM

## 2016-10-16 NOTE — Progress Notes (Signed)
   Subjective:    Patient ID: Bradley Mckenzie., male    DOB: 03-13-1965, 51 y.o.   MRN: [id number]  HPI Temporary improvement with chiropractor care. LBP radiates posterior thighs to knees.    Review of Systems     Objective:   Physical Exam  Gait normal. Heel/toe okay. Mild decrease ROM. Active Kemps test neg. DTR's 2+ at knees with reinforcement; trace at ankle. MMT 5/5.       Assessment & Plan:  Plan: short term course of PT for HEP. Return after PT.

## 2016-10-17 ENCOUNTER — Other Ambulatory Visit: Payer: Self-pay

## 2016-10-17 ENCOUNTER — Ambulatory Visit: Payer: Self-pay | Admitting: Ophthalmology

## 2016-10-17 DIAGNOSIS — E119 Type 2 diabetes mellitus without complications: Secondary | ICD-10-CM

## 2016-10-18 LAB — CBC
HEMOGLOBIN: 11.2 g/dL — AB (ref 13.0–17.7)
Hematocrit: 33.8 % — ABNORMAL LOW (ref 37.5–51.0)
MCH: 27.3 pg (ref 26.6–33.0)
MCHC: 33.1 g/dL (ref 31.5–35.7)
MCV: 82 fL (ref 79–97)
Platelets: 276 10*3/uL (ref 150–379)
RBC: 4.1 x10E6/uL — ABNORMAL LOW (ref 4.14–5.80)
RDW: 14.9 % (ref 12.3–15.4)
WBC: 9.9 10*3/uL (ref 3.4–10.8)

## 2016-10-18 LAB — COMPREHENSIVE METABOLIC PANEL
A/G RATIO: 1.2 (ref 1.2–2.2)
ALT: 17 IU/L (ref 0–44)
AST: 14 IU/L (ref 0–40)
Albumin: 4.2 g/dL (ref 3.5–5.5)
Alkaline Phosphatase: 88 IU/L (ref 39–117)
BUN/Creatinine Ratio: 9 (ref 9–20)
BUN: 13 mg/dL (ref 6–24)
Bilirubin Total: 0.3 mg/dL (ref 0.0–1.2)
CALCIUM: 9 mg/dL (ref 8.7–10.2)
CO2: 21 mmol/L (ref 20–29)
Chloride: 99 mmol/L (ref 96–106)
Creatinine, Ser: 1.46 mg/dL — ABNORMAL HIGH (ref 0.76–1.27)
GFR, EST AFRICAN AMERICAN: 64 mL/min/{1.73_m2} (ref >59)
GFR, EST NON AFRICAN AMERICAN: 56 mL/min/{1.73_m2} — AB (ref >59)
Globulin, Total: 3.4 g/dL (ref 1.5–4.5)
Glucose: 222 mg/dL — ABNORMAL HIGH (ref 65–99)
Potassium: 4.2 mmol/L (ref 3.5–5.2)
SODIUM: 138 mmol/L (ref 134–144)
TOTAL PROTEIN: 7.6 g/dL (ref 6.0–8.5)

## 2016-10-18 LAB — HEMOGLOBIN A1C
ESTIMATED AVERAGE GLUCOSE: 192 mg/dL
HEMOGLOBIN A1C: 8.3 % — AB (ref 4.8–5.6)

## 2016-10-24 ENCOUNTER — Ambulatory Visit: Payer: Self-pay

## 2016-11-14 ENCOUNTER — Ambulatory Visit: Payer: Self-pay

## 2016-11-28 ENCOUNTER — Ambulatory Visit: Payer: Self-pay | Admitting: Adult Health Nurse Practitioner

## 2016-11-28 ENCOUNTER — Other Ambulatory Visit: Payer: Self-pay

## 2016-11-28 VITALS — BP 138/78 | HR 90 | Wt 288.2 lb

## 2016-11-28 DIAGNOSIS — E782 Mixed hyperlipidemia: Secondary | ICD-10-CM

## 2016-11-28 DIAGNOSIS — E119 Type 2 diabetes mellitus without complications: Secondary | ICD-10-CM

## 2016-11-28 DIAGNOSIS — N182 Chronic kidney disease, stage 2 (mild): Secondary | ICD-10-CM

## 2016-11-28 DIAGNOSIS — I10 Essential (primary) hypertension: Secondary | ICD-10-CM

## 2016-11-28 MED ORDER — METFORMIN HCL 850 MG PO TABS: 850.0000 mg | ORAL_TABLET | Freq: Two times a day (BID) | ORAL | 3 refills | Status: DC

## 2016-11-28 MED ORDER — ATORVASTATIN CALCIUM 10 MG PO TABS: 10.0000 mg | ORAL_TABLET | Freq: Every day | ORAL | 2 refills | Status: DC

## 2016-11-28 NOTE — Progress Notes (Signed)
Subjective:    Patient ID: Bradley Pitts., male    DOB: [birth year], 51 y.o.   MRN: [id number]  HPI   Bradley Mckenzie is a 51 yo male here for f/u of HTN and diabetes.   Patient Active Problem List   Diagnosis Date Noted  . Stomach upset 09/19/2016  . Somatic dysfunction of spine, lumbar 08/28/2016  . Segmental dysfunction of cervical region 08/28/2016  . Muscle spasm of back 08/28/2016  . Gynecomastia 12/22/2015  . Dental caries 12/12/2015  . Medication monitoring encounter 12/12/2015  . Low back pain 12/12/2015  . Chronic kidney disease, stage II (mild)   . OSA on CPAP   . Uncontrolled hypertension 07/08/2015  . Cardiomyopathy ([place])   . Hyperlipidemia   . Morbid obesity ([place])   . Diabetes mellitus without complication ([place])   . Hypertensive heart disease   . Sinusitis 11/11/2014  . Tachycardia 09/12/2014  . COPD (chronic obstructive pulmonary disease) ([place]) 08/05/2014  . Chronic systolic heart failure ([place]) 06/16/2014  . Obstructive sleep apnea 06/16/2014  . Essential hypertension 06/16/2014  . H/O respiratory system disease 02/24/2014   Allergies as of 11/28/2016   Not on File     Medication List        Accurate as of 11/28/16  7:46 PM. Always use your most recent med list.          albuterol 108 (90 Base) MCG/ACT inhaler Commonly known as:  PROVENTIL HFA;VENTOLIN HFA Inhale 2 puffs into the lungs every 6 (six) hours as needed for wheezing or shortness of breath. Reported on 07/08/2015   aspirin EC 81 MG tablet Take 1 tablet (81 mg total) by mouth daily.   carvedilol 12.5 MG tablet Commonly known as:  COREG Take 1 tablet (12.5 mg total) by mouth 2 (two) times daily with a meal.   furosemide 40 MG tablet Commonly known as:  LASIX Take 1 tablet (40 mg total) by mouth 2 (two) times daily.   hydrALAZINE 25 MG tablet Commonly known as:  APRESOLINE Take 1 tablet (25 mg total) by mouth 2 (two) times daily.   lisinopril 20 MG tablet Commonly known as:   PRINIVIL,ZESTRIL Take 2 tablets (40 mg total) by mouth daily.   spironolactone 25 MG tablet Commonly known as:  ALDACTONE Take 2 tablets (50 mg total) by mouth daily.        Review of Systems Diabetes - last A1c was 8.3. Kidneys are stable - slightly elevated numbers. Pt denies blood in stools and dark stools. Pt self-reports last glucose as 168. Pt takes Metformin 500mg  daily. Pt denies neuropathy.  HTN - BP is stable.  Hyperlipidemia - pt currently does not take med.     Objective:   Physical Exam  Constitutional: He is oriented to person, place, and time. He appears well-developed and well-nourished.  Cardiovascular: Normal rate, regular rhythm and normal heart sounds.  Pulmonary/Chest: Effort normal. He has wheezes.  Scattered wheezes.   Abdominal: Soft. Bowel sounds are normal.  Neurological: He is alert and oriented to person, place, and time.  Skin: Skin is warm and dry.  Vitals reviewed.   BP 138/78   Pulse 90   Wt 288 lb 3.2 oz (130.7 kg)   BMI 42.56 kg/m        Assessment & Plan:   Increase Metformin to 850mg  BID for diabetes control. Continue to monitor. Gave pt Scientist, clinical (histocompatibility and immunogenetics) for diabetes.  Rx Lipitor 10mg  for hyperlipidemia.  F/u 4 weeks to monitor Lipids.

## 2016-11-29 ENCOUNTER — Other Ambulatory Visit: Payer: Self-pay | Admitting: Family

## 2016-12-04 ENCOUNTER — Ambulatory Visit: Payer: Self-pay | Attending: Family | Admitting: Family

## 2016-12-04 ENCOUNTER — Other Ambulatory Visit: Payer: Self-pay

## 2016-12-04 ENCOUNTER — Encounter: Payer: Self-pay | Admitting: Family

## 2016-12-04 VITALS — BP 138/68 | HR 90 | Resp 18 | Ht 69.0 in | Wt 287.1 lb

## 2016-12-04 DIAGNOSIS — E119 Type 2 diabetes mellitus without complications: Secondary | ICD-10-CM | POA: Insufficient documentation

## 2016-12-04 DIAGNOSIS — Z87891 Personal history of nicotine dependence: Secondary | ICD-10-CM | POA: Insufficient documentation

## 2016-12-04 DIAGNOSIS — Z6841 Body Mass Index (BMI) 40.0 and over, adult: Secondary | ICD-10-CM | POA: Insufficient documentation

## 2016-12-04 DIAGNOSIS — Z833 Family history of diabetes mellitus: Secondary | ICD-10-CM | POA: Insufficient documentation

## 2016-12-04 DIAGNOSIS — I5022 Chronic systolic (congestive) heart failure: Secondary | ICD-10-CM | POA: Insufficient documentation

## 2016-12-04 DIAGNOSIS — F329 Major depressive disorder, single episode, unspecified: Secondary | ICD-10-CM | POA: Insufficient documentation

## 2016-12-04 DIAGNOSIS — Z9989 Dependence on other enabling machines and devices: Secondary | ICD-10-CM

## 2016-12-04 DIAGNOSIS — J449 Chronic obstructive pulmonary disease, unspecified: Secondary | ICD-10-CM | POA: Insufficient documentation

## 2016-12-04 DIAGNOSIS — E785 Hyperlipidemia, unspecified: Secondary | ICD-10-CM | POA: Insufficient documentation

## 2016-12-04 DIAGNOSIS — Z79899 Other long term (current) drug therapy: Secondary | ICD-10-CM | POA: Insufficient documentation

## 2016-12-04 DIAGNOSIS — I110 Hypertensive heart disease with heart failure: Secondary | ICD-10-CM | POA: Insufficient documentation

## 2016-12-04 DIAGNOSIS — Z7984 Long term (current) use of oral hypoglycemic drugs: Secondary | ICD-10-CM | POA: Insufficient documentation

## 2016-12-04 DIAGNOSIS — Z8249 Family history of ischemic heart disease and other diseases of the circulatory system: Secondary | ICD-10-CM | POA: Insufficient documentation

## 2016-12-04 DIAGNOSIS — G4733 Obstructive sleep apnea (adult) (pediatric): Secondary | ICD-10-CM | POA: Insufficient documentation

## 2016-12-04 DIAGNOSIS — I429 Cardiomyopathy, unspecified: Secondary | ICD-10-CM | POA: Insufficient documentation

## 2016-12-04 DIAGNOSIS — I10 Essential (primary) hypertension: Secondary | ICD-10-CM

## 2016-12-04 DIAGNOSIS — Z7982 Long term (current) use of aspirin: Secondary | ICD-10-CM | POA: Insufficient documentation

## 2016-12-04 LAB — GLUCOSE, CAPILLARY: Glucose-Capillary: 236 mg/dL — ABNORMAL HIGH (ref 65–99)

## 2016-12-04 MED ORDER — POTASSIUM CHLORIDE CRYS ER 20 MEQ PO TBCR: 20.0000 meq | EXTENDED_RELEASE_TABLET | Freq: Every day | ORAL | 3 refills | Status: DC

## 2016-12-04 MED ORDER — ALBUTEROL SULFATE HFA 108 (90 BASE) MCG/ACT IN AERS: 2.0000 | INHALATION_SPRAY | Freq: Four times a day (QID) | RESPIRATORY_TRACT | 3 refills | Status: DC | PRN

## 2016-12-04 NOTE — Progress Notes (Signed)
Patient ID: Bradley Mckenzie., male    DOB: 1965/06/25, 51 y.o.   MRN: [id number]  HPI  Bradley Mckenzie is a 51 y/o male who has a history of diabetes, HTN, obstructive sleep apnea, depression, hyperlipidemia, morbid obesity and heart failure with a reduced ejection fraction. Remote tobacco exposure.   Last echo was done 09/02/15 with an EF of 30-35%, mild mitral regurgitation and no aortic stenosis. EF has declined from 40-45% in March 2017 but he had been inconsistent taking his medications & not using his CPAP during this time.  Has not been admitted or been in the ED in the last 6 months.   Returns today for a follow-up visit with a chief complaint of a non-productive cough. He describes this as chronic in nature having been intermittently present for several years. Does feel like it's gotten worse with the weather changes. Has not been using his albuterol inhaler much. Has associated fatigue, shortness of breath, wheezing, edema and light-headedness along with this. He denies any chest pain, palpitations, weight gain or difficulty sleeping.   Past Medical History:  Diagnosis Date  . Cardiomyopathy ([place])    a. 01/2014 Echo: EF 25-30%;  b. ? ischemic vs non-ischemic.  He's never had an ischemic eval.  . Chronic combined systolic (congestive) and diastolic (congestive) heart failure ([place])    a. 01/2014 Echo: EF 25-30%. b. Echo 03/2015: Improved EF of 40-45%  . Depression   . Diabetes mellitus without complication ([place])    a. Not previously on outpt meds.  . Hyperlipidemia   . Hypertensive heart disease    a. Since his 70's.  . Morbid obesity ([place])   . Obstructive sleep apnea    a. Has not used CPAP since ~ 2012.   Past Surgical History:  Procedure Laterality Date  . HERNIA REPAIR  2004   Family History  Problem Relation Age of Onset  . Heart disease Mother        alive & well.  [name] Heart attack Father 41       died @ 27 of cancer.  . Diabetes Father   . Cancer Father        "in his  stomach"  . Cancer Sister   . Cancer Paternal Uncle    Social History   Tobacco Use  . Smoking status: Former Research scientist (life sciences)  . Smokeless tobacco: Never Used  . Tobacco comment: Quit in his 39's.  Substance Use Topics  . Alcohol use: Yes    Alcohol/week: 0.0 oz    Comment: occassional alcohol use at special events    Prior to Admission medications   Medication Sig Start Date End Date Taking? Authorizing Provider  albuterol (PROVENTIL HFA;VENTOLIN HFA) 108 (90 Base) MCG/ACT inhaler Inhale 2 puffs into the lungs every 6 (six) hours as needed for wheezing or shortness of breath. Reported on 07/08/2015 12/04/16  Yes Darylene Price A, FNP  aspirin EC 81 MG tablet Take 1 tablet (81 mg total) by mouth daily. 07/09/16  Yes Alisa Graff, FNP  atorvastatin (LIPITOR) 10 MG tablet Take 1 tablet (10 mg total) daily by mouth. 11/28/16  Yes Doles-Johnson, Teah, NP  carvedilol (COREG) 12.5 MG tablet Take 1 tablet (12.5 mg total) by mouth 2 (two) times daily with a meal. 09/04/16 12/04/16 Yes Alisa Graff, FNP  furosemide (LASIX) 40 MG tablet TAKE ONE TABLET BY MOUTH 2 TIMES A DAY 11/29/16  Yes Darylene Price A, FNP  hydrALAZINE (APRESOLINE) 25 MG tablet TAKE ONE TABLET BY  MOUTH 2 TIMES A DAY 11/29/16  Yes Darylene Price A, FNP  lisinopril (PRINIVIL,ZESTRIL) 20 MG tablet Take 2 tablets (40 mg total) by mouth daily. 07/09/16  Yes Alisa Graff, FNP  metFORMIN (GLUCOPHAGE) 850 MG tablet Take 1 tablet (850 mg total) 2 (two) times daily with a meal by mouth. 11/28/16  Yes Doles-Johnson, Teah, NP  spironolactone (ALDACTONE) 25 MG tablet Take 2 tablets (50 mg total) by mouth daily. 07/09/16  Yes Alisa Graff, FNP   Review of Systems  Constitutional: Positive for fatigue. Negative for appetite change.  HENT: Positive for congestion. Negative for postnasal drip.   Eyes: Negative.   Respiratory: Positive for cough, shortness of breath and wheezing. Negative for chest tightness.   Cardiovascular: Positive for leg  swelling (minimal). Negative for chest pain and palpitations.  Gastrointestinal: Negative for abdominal distention, abdominal pain and nausea.  Endocrine: Negative.   Genitourinary: Negative.   Musculoskeletal: Positive for back pain (better ). Negative for neck pain.  Skin: Negative.   Allergic/Immunologic: Negative.   Neurological: Positive for light-headedness (when coughing too much). Negative for dizziness.  Hematological: Negative for adenopathy. Does not bruise/bleed easily.  Psychiatric/Behavioral: Negative for dysphoric mood, sleep disturbance (wearing CPAP nightly ) and suicidal ideas. The patient is not nervous/anxious.    Vitals:   12/04/16 1006  BP: 138/68  Pulse: 90  Resp: 18  SpO2: 98%  Weight: 287 lb 2 oz (130.2 kg)  Height: 5\' 9"  (1.753 m)   Wt Readings from Last 3 Encounters:  12/04/16 287 lb 2 oz (130.2 kg)  11/28/16 288 lb 3.2 oz (130.7 kg)  09/19/16 282 lb 4.8 oz (128.1 kg)    Lab Results  Component Value Date   CREATININE 1.46 (H) 10/17/2016   CREATININE 1.47 (H) 08/15/2016   CREATININE 1.35 (H) 07/04/2016   Physical Exam  Constitutional: He is oriented to person, place, and time. He appears well-developed and well-nourished.  HENT:  Head: Normocephalic and atraumatic.  Neck: Normal range of motion. Neck supple. No JVD present.  Cardiovascular: Normal rate and regular rhythm.  Pulmonary/Chest: Effort normal. He has wheezes in the right lower field and the left lower field. He has no rales.  Abdominal: Soft. He exhibits no distension. There is no tenderness.  Musculoskeletal: He exhibits no edema or tenderness.  Neurological: He is alert and oriented to person, place, and time.  Skin: Skin is warm and dry.  Psychiatric: He has a normal mood and affect. His behavior is normal.  Nursing note and vitals reviewed.   Assessment & Plan:  1: Chronic heart failure with reduced ejection fraction-  - NYHA Class II - euvolemic today - weighing daily and  he was reminded to call for an overnight weight gain of >2 pounds or a weekly weight gain of >5 pounds - trying to eat low sodium foods when he can but says it's difficult at times due to finances - drinking 64 ounces of water daily - ReDS vest reading not done due to BMI - mentions cramping in his legs & would like to go back on potassium. Will decrease his spironolactone to 25mg  daily and resume potassium 21meq daily (now going to medication management clinic for medication assistance) - getting labs drawn by [facility] on 12/19/16  2: HTN- - BP looks good today - went to [facility] on 11/28/16 & returns 12/26/16 - BMP on 10/17/16 reviewed and shows sodium 138, potassium 4.2 and GFR 64  3: Obstructive sleep apnea- -  wearing CPAP nightly - remains uninsured - under a lot of stress as his fiance' is getting ready to start dialysis and he's trying to get his mom moved from [place] into a local facility  4: Diabetes- - nonfasting glucose in clinic today was 236 - says that he's not been checking it consistently and he was encouraged to do so - A1c on 10/17/16 was 8.3%  5: COPD-  - hasn't been using his albuterol inhaler much because he's been trying to make it last as he's always been asking others if they have any extra ones - RX sent to medication management clinic for albuterol inhaler and reminded him that he could use it every 6 hours as needed for shortness of breath, wheezing  Patient did not bring his medications nor a list. Each medication was verbally reviewed with the patient and he was encouraged to bring the bottles to every visit to confirm accuracy of list.  Return in 3 months or sooner for any questions/problems before then.

## 2016-12-04 NOTE — Patient Instructions (Addendum)
Continue weighing daily and call for an overnight weight gain of > 2 pounds or a weekly weight gain of >5 pounds.  Decrease spironolactone to 25mg  daily. Add potassium 1 tablet daily.

## 2016-12-19 ENCOUNTER — Other Ambulatory Visit: Payer: Self-pay

## 2016-12-19 DIAGNOSIS — E119 Type 2 diabetes mellitus without complications: Secondary | ICD-10-CM

## 2016-12-19 DIAGNOSIS — E782 Mixed hyperlipidemia: Secondary | ICD-10-CM

## 2016-12-19 DIAGNOSIS — N182 Chronic kidney disease, stage 2 (mild): Secondary | ICD-10-CM

## 2016-12-19 DIAGNOSIS — I10 Essential (primary) hypertension: Secondary | ICD-10-CM

## 2016-12-20 LAB — COMPREHENSIVE METABOLIC PANEL
ALK PHOS: 88 IU/L (ref 39–117)
ALT: 11 IU/L (ref 0–44)
AST: 15 IU/L (ref 0–40)
Albumin/Globulin Ratio: 1.4 (ref 1.2–2.2)
Albumin: 4.4 g/dL (ref 3.5–5.5)
BUN/Creatinine Ratio: 11 (ref 9–20)
BUN: 16 mg/dL (ref 6–24)
Bilirubin Total: <0.2 mg/dL (ref 0.0–1.2)
CALCIUM: 9.4 mg/dL (ref 8.7–10.2)
CO2: 24 mmol/L (ref 20–29)
CREATININE: 1.49 mg/dL — AB (ref 0.76–1.27)
Chloride: 103 mmol/L (ref 96–106)
GFR calc Af Amer: 63 mL/min/{1.73_m2} (ref >59)
GFR, EST NON AFRICAN AMERICAN: 54 mL/min/{1.73_m2} — AB (ref >59)
GLOBULIN, TOTAL: 3.2 g/dL (ref 1.5–4.5)
GLUCOSE: 149 mg/dL — AB (ref 65–99)
Potassium: 4.1 mmol/L (ref 3.5–5.2)
SODIUM: 141 mmol/L (ref 134–144)
Total Protein: 7.6 g/dL (ref 6.0–8.5)

## 2016-12-20 LAB — HEMOGLOBIN A1C
ESTIMATED AVERAGE GLUCOSE: 209 mg/dL
HEMOGLOBIN A1C: 8.9 % — AB (ref 4.8–5.6)

## 2016-12-20 LAB — LIPID PANEL
CHOL/HDL RATIO: 2.8 ratio (ref 0.0–5.0)
CHOLESTEROL TOTAL: 124 mg/dL (ref 100–199)
HDL: 44 mg/dL (ref >39)
LDL CALC: 65 mg/dL (ref 0–99)
Triglycerides: 76 mg/dL (ref 0–149)
VLDL CHOLESTEROL CAL: 15 mg/dL (ref 5–40)

## 2016-12-26 ENCOUNTER — Ambulatory Visit: Payer: Self-pay | Admitting: Family Medicine

## 2016-12-26 VITALS — BP 115/66 | HR 89 | Temp 98.0°F | Wt 288.5 lb

## 2016-12-26 DIAGNOSIS — I10 Essential (primary) hypertension: Secondary | ICD-10-CM

## 2016-12-26 DIAGNOSIS — J449 Chronic obstructive pulmonary disease, unspecified: Secondary | ICD-10-CM

## 2016-12-26 DIAGNOSIS — E119 Type 2 diabetes mellitus without complications: Secondary | ICD-10-CM

## 2016-12-26 DIAGNOSIS — E782 Mixed hyperlipidemia: Secondary | ICD-10-CM

## 2016-12-26 DIAGNOSIS — G4733 Obstructive sleep apnea (adult) (pediatric): Secondary | ICD-10-CM

## 2016-12-26 DIAGNOSIS — M549 Dorsalgia, unspecified: Secondary | ICD-10-CM

## 2016-12-26 DIAGNOSIS — N182 Chronic kidney disease, stage 2 (mild): Secondary | ICD-10-CM

## 2016-12-26 DIAGNOSIS — Z09 Encounter for follow-up examination after completed treatment for conditions other than malignant neoplasm: Secondary | ICD-10-CM

## 2016-12-26 MED ORDER — NAPROXEN 250 MG PO TABS: 250.0000 mg | ORAL_TABLET | Freq: Two times a day (BID) | ORAL | 0 refills | Status: DC

## 2016-12-26 NOTE — Progress Notes (Signed)
Patient: Bradley Mckenzie. Male    DOB: 09-Feb-1965   51 y.o.   MRN: [id number] Visit Date: 12/26/2016  Today's Provider: Azzie Glatter, FNP   Chief Complaint  Patient presents with  . Follow-up    metformin dosage change last need   Subjective:    HPI  Patient states that he is doing good. He states that he gets shortness of breath on exertion. States that he is continuing to have back pain on a daily basis. States that he is on waiting list to have Physical Therapy at [facility]. States that he is in the process of getting his CPAP readjusted because it continues to link air. States that he is not getting much physical activity. Patient states that he is currently not eating a balanced diet including vegetables.   No Known Allergies This SmartLink is deprecated. Use AVSMEDLIST instead to display the medication list for a patient.  Review of Systems  Constitutional: Negative.   HENT: Negative.   Eyes: Negative.   Respiratory: Positive for cough, shortness of breath and wheezing.   Cardiovascular: Negative.   Gastrointestinal: Positive for abdominal distention.  Endocrine: Negative.   Genitourinary: Negative.   Musculoskeletal: Positive for back pain.  Skin: Negative.   Allergic/Immunologic: Negative.   Neurological: Negative.   Hematological: Negative.   Psychiatric/Behavioral: Negative.     Social History   Tobacco Use  . Smoking status: Former Research scientist (life sciences)  . Smokeless tobacco: Never Used  . Tobacco comment: Quit in his 69's.  Substance Use Topics  . Alcohol use: Yes    Alcohol/week: 0.0 oz    Comment: occassional alcohol use at special events   Objective:   BP 115/66 (BP Location: Left Arm, Patient Position: Sitting, Cuff Size: Normal)   Pulse 89   Temp 98 F (36.7 C) (Oral)   Wt 288 lb 8 oz (130.9 kg)   BMI 42.60 kg/m   Physical Exam  Constitutional: He appears well-developed and well-nourished.  HENT:  Head: Normocephalic and atraumatic.  Right Ear: External  ear normal.  Left Ear: External ear normal.  Nose: Nose normal.  Mouth/Throat: Oropharynx is clear and moist.  Eyes: Pupils are equal, round, and reactive to light.  Neck: Normal range of motion. Neck supple.  Cardiovascular: Normal rate, regular rhythm, normal heart sounds and intact distal pulses.  Pulmonary/Chest: Effort normal. He has wheezes.  Wheezes in all expiratory wheezes in all lung fields.  Abdominal: Soft. Bowel sounds are normal.  Musculoskeletal:  Limited ROM in lower back.  All other extremities normal.  Neurological: He is alert. He has normal reflexes.  Skin: Skin is warm and dry.  Psychiatric: He has a normal mood and affect. His behavior is normal. Judgment and thought content normal.  Nursing note and vitals reviewed.       Assessment & Plan:   1. Essential hypertension Stable. BP=115/66 today. Continue to take Coreg, Lasix, Hydralazine, and Lisinopril as directed. Monitor.   2. Back pain, unspecified back location, unspecified back pain laterality, unspecified chronicity Patient counseled to do mild stretching exercises and cardio for at least 30 minutes 5 times weekly for weight loss to improve back pain. New Rx for Naproxen 250 mg BID to pharmacy today.   3. Diabetes mellitus without complication (HCC) Hgb= 8.9, Glucose= 149 on 12/19/2016. Patient has new Rx for Glucophage 850 mg BID. Patient to increase foods high in vegetables and low in fat. Monitor.   4. Chronic kidney disease, stage II (mild) Creatinine= 1.49 on 12/19/2016.  5. Chronic obstructive pulmonary disease, unspecified COPD type ([place]) Stable. Continue to use Albuterol as directed. Monitor.  6. Morbid obesity ([place]) See # 1 and # 3.  7. Obstructive sleep apnea Continue to use CPAP as directed.   8. Mixed hyperlipidemia Stable. Lipid Profile on 12/19/2016. Monitor.   9. Follow up Azzie Glatter, [facility]

## 2017-01-22 ENCOUNTER — Encounter: Payer: Self-pay | Admitting: Emergency Medicine

## 2017-01-22 ENCOUNTER — Other Ambulatory Visit: Payer: Self-pay

## 2017-01-22 ENCOUNTER — Emergency Department
Admission: EM | Admit: 2017-01-22 | Discharge: 2017-01-22 | Disposition: A | Discharge status: Home / Self Care | Payer: Self-pay | Attending: Emergency Medicine | Admitting: Emergency Medicine

## 2017-01-22 ENCOUNTER — Emergency Department: Payer: Self-pay

## 2017-01-22 DIAGNOSIS — I130 Hypertensive heart and chronic kidney disease with heart failure and stage 1 through stage 4 chronic kidney disease, or unspecified chronic kidney disease: Secondary | ICD-10-CM | POA: Insufficient documentation

## 2017-01-22 DIAGNOSIS — E119 Type 2 diabetes mellitus without complications: Secondary | ICD-10-CM | POA: Insufficient documentation

## 2017-01-22 DIAGNOSIS — Z87891 Personal history of nicotine dependence: Secondary | ICD-10-CM | POA: Insufficient documentation

## 2017-01-22 DIAGNOSIS — Z7984 Long term (current) use of oral hypoglycemic drugs: Secondary | ICD-10-CM | POA: Insufficient documentation

## 2017-01-22 DIAGNOSIS — J441 Chronic obstructive pulmonary disease with (acute) exacerbation: Secondary | ICD-10-CM | POA: Insufficient documentation

## 2017-01-22 DIAGNOSIS — N182 Chronic kidney disease, stage 2 (mild): Secondary | ICD-10-CM | POA: Insufficient documentation

## 2017-01-22 DIAGNOSIS — I5042 Chronic combined systolic (congestive) and diastolic (congestive) heart failure: Secondary | ICD-10-CM | POA: Insufficient documentation

## 2017-01-22 DIAGNOSIS — Z7982 Long term (current) use of aspirin: Secondary | ICD-10-CM | POA: Insufficient documentation

## 2017-01-22 LAB — BASIC METABOLIC PANEL
Anion gap: 9 (ref 5–15)
BUN: 13 mg/dL (ref 6–20)
CHLORIDE: 102 mmol/L (ref 101–111)
CO2: 25 mmol/L (ref 22–32)
CREATININE: 1.38 mg/dL — AB (ref 0.61–1.24)
Calcium: 9.4 mg/dL (ref 8.9–10.3)
GFR calc Af Amer: >60 mL/min (ref >60)
GFR, EST NON AFRICAN AMERICAN: 58 mL/min — AB (ref >60)
Glucose, Bld: 161 mg/dL — ABNORMAL HIGH (ref 65–99)
Potassium: 3.7 mmol/L (ref 3.5–5.1)
SODIUM: 136 mmol/L (ref 135–145)

## 2017-01-22 LAB — CBC
HCT: 35.4 % — ABNORMAL LOW (ref 40.0–52.0)
Hemoglobin: 11.4 g/dL — ABNORMAL LOW (ref 13.0–18.0)
MCH: 27.4 pg (ref 26.0–34.0)
MCHC: 32.4 g/dL (ref 32.0–36.0)
MCV: 84.6 fL (ref 80.0–100.0)
PLATELETS: 284 10*3/uL (ref 150–440)
RBC: 4.18 MIL/uL — ABNORMAL LOW (ref 4.40–5.90)
RDW: 15.1 % — ABNORMAL HIGH (ref 11.5–14.5)
WBC: 9.5 10*3/uL (ref 3.8–10.6)

## 2017-01-22 MED ORDER — METHYLPREDNISOLONE SODIUM SUCC 125 MG IJ SOLR
125.0000 mg | Freq: Once | INTRAMUSCULAR | Status: AC
  Administered 2017-01-22: 125 mg via INTRAVENOUS
  Filled 2017-01-22 (×2): qty 2

## 2017-01-22 MED ORDER — PREDNISONE 20 MG PO TABS: 40.0000 mg | ORAL_TABLET | Freq: Every day | ORAL | 0 refills | Status: DC

## 2017-01-22 MED ORDER — IPRATROPIUM-ALBUTEROL 0.5-2.5 (3) MG/3ML IN SOLN
3.0000 mL | Freq: Once | RESPIRATORY_TRACT | Status: AC
  Administered 2017-01-22: 3 mL via RESPIRATORY_TRACT

## 2017-01-22 MED ORDER — ALBUTEROL (5 MG/ML) CONTINUOUS INHALATION SOLN
10.0000 mg/h | INHALATION_SOLUTION | Freq: Once | RESPIRATORY_TRACT | Status: AC
  Administered 2017-01-22: 10 mg/h via RESPIRATORY_TRACT
  Filled 2017-01-22: qty 20

## 2017-01-22 MED ORDER — ALBUTEROL SULFATE (2.5 MG/3ML) 0.083% IN NEBU
INHALATION_SOLUTION | RESPIRATORY_TRACT | Status: AC
  Filled 2017-01-22: qty 12

## 2017-01-22 MED ORDER — ACETYLCYSTEINE 20 % IN SOLN
4.0000 mL | Freq: Once | RESPIRATORY_TRACT | Status: AC
  Administered 2017-01-22: 4 mL via RESPIRATORY_TRACT
  Filled 2017-01-22: qty 4

## 2017-01-22 MED ORDER — ALBUTEROL SULFATE (2.5 MG/3ML) 0.083% IN NEBU
5.0000 mg | INHALATION_SOLUTION | Freq: Once | RESPIRATORY_TRACT | Status: AC
  Administered 2017-01-22: 5 mg via RESPIRATORY_TRACT
  Filled 2017-01-22: qty 6

## 2017-01-22 MED ORDER — IPRATROPIUM-ALBUTEROL 0.5-2.5 (3) MG/3ML IN SOLN
RESPIRATORY_TRACT | Status: AC
  Filled 2017-01-22: qty 6

## 2017-01-22 MED ORDER — BENZONATATE 100 MG PO CAPS: 100.0000 mg | ORAL_CAPSULE | Freq: Four times a day (QID) | ORAL | 0 refills | Status: DC | PRN

## 2017-01-22 NOTE — ED Notes (Signed)
Pt finished hour long nebulizer. States "I can't hear the wheezing but I still feel the same." No distress noted at this time. Pt sitting on stretcher watching tv. Talking in complete sentences.

## 2017-01-22 NOTE — ED Notes (Signed)
Pt ambulatory to toilet and back by self with steady gait noted.

## 2017-01-22 NOTE — ED Notes (Signed)
Pharmacy called to send Albuterol for neb

## 2017-01-22 NOTE — ED Notes (Signed)
Pt ambulatory to toilet by self, steady gait noted.

## 2017-01-22 NOTE — ED Provider Notes (Signed)
[facility] Emergency Department Provider Note   ____________________________________________   I have reviewed the triage vital signs and the nursing notes.   HISTORY  Chief Complaint Shortness of Breath   History limited by: Not Limited   HPI Bradley Blessed Cotham. is a 52 y.o. male who presents to the emergency department today with primary complaint of shortness of breath.  DURATION:1.5 weeks TIMING: constant SEVERITY: severe QUALITY: SOB CONTEXT: patient with history of COPD. States for the past 1.5 weeks has been short of breath.  MODIFYING FACTORS: has tried home inhalers without relief ASSOCIATED SYMPTOMS: cough. Some chest pain with cough. Some vomiting with cough. Some dizziness. No fever.  Per medical record review patient has a history of heart failure. HLD, HTN. COPD.  Past Medical History:  Diagnosis Date  . Cardiomyopathy ([place])    a. 01/2014 Echo: EF 25-30%;  b. ? ischemic vs non-ischemic.  He's never had an ischemic eval.  . Chronic combined systolic (congestive) and diastolic (congestive) heart failure ([place])    a. 01/2014 Echo: EF 25-30%. b. Echo 03/2015: Improved EF of 40-45%  . Depression   . Diabetes mellitus without complication ([place])    a. Not previously on outpt meds.  . Hyperlipidemia   . Hypertensive heart disease    a. Since his 55's.  . Morbid obesity ([place])   . Obstructive sleep apnea    a. Has not used CPAP since ~ 2012.    Patient Active Problem List   Diagnosis Date Noted  . Stomach upset 09/19/2016  . Somatic dysfunction of spine, lumbar 08/28/2016  . Segmental dysfunction of cervical region 08/28/2016  . Muscle spasm of back 08/28/2016  . Gynecomastia 12/22/2015  . Dental caries 12/12/2015  . Medication monitoring encounter 12/12/2015  . Low back pain 12/12/2015  . Chronic kidney disease, stage II (mild)   . OSA on CPAP   . Uncontrolled hypertension 07/08/2015  . Cardiomyopathy ([place])   . Hyperlipidemia   .  Morbid obesity ([place])   . Diabetes mellitus without complication ([place])   . Hypertensive heart disease   . Sinusitis 11/11/2014  . Tachycardia 09/12/2014  . COPD (chronic obstructive pulmonary disease) ([place]) 08/05/2014  . Chronic systolic heart failure ([place]) 06/16/2014  . Obstructive sleep apnea 06/16/2014  . Essential hypertension 06/16/2014  . H/O respiratory system disease 02/24/2014    Past Surgical History:  Procedure Laterality Date  . HERNIA REPAIR  2004    Prior to Admission medications   Medication Sig Start Date End Date Taking? Authorizing Provider  albuterol (PROVENTIL HFA;VENTOLIN HFA) 108 (90 Base) MCG/ACT inhaler Inhale 2 puffs into the lungs every 6 (six) hours as needed for wheezing or shortness of breath. Reported on 07/08/2015 12/04/16   Alisa Graff, FNP  aspirin EC 81 MG tablet Take 1 tablet (81 mg total) by mouth daily. 07/09/16   Alisa Graff, FNP  atorvastatin (LIPITOR) 10 MG tablet Take 1 tablet (10 mg total) daily by mouth. 11/28/16   Doles-Johnson, Teah, NP  carvedilol (COREG) 12.5 MG tablet Take 1 tablet (12.5 mg total) by mouth 2 (two) times daily with a meal. 09/04/16 12/04/16  Alisa Graff, FNP  furosemide (LASIX) 40 MG tablet TAKE ONE TABLET BY MOUTH 2 TIMES A DAY 11/29/16   Darylene Price A, FNP  hydrALAZINE (APRESOLINE) 25 MG tablet TAKE ONE TABLET BY MOUTH 2 TIMES A DAY 11/29/16   Darylene Price A, FNP  lisinopril (PRINIVIL,ZESTRIL) 20 MG tablet Take 2 tablets (40 mg  total) by mouth daily. 07/09/16   Alisa Graff, FNP  metFORMIN (GLUCOPHAGE) 850 MG tablet Take 1 tablet (850 mg total) 2 (two) times daily with a meal by mouth. 11/28/16   Doles-Johnson, Teah, NP  naproxen (NAPROSYN) 250 MG tablet Take 1 tablet (250 mg total) by mouth 2 (two) times daily with a meal. 12/26/16   Azzie Glatter, FNP  potassium chloride SA (K-DUR,KLOR-CON) 20 MEQ tablet Take 1 tablet (20 mEq total) by mouth daily. 12/04/16 03/04/17  Alisa Graff, FNP  spironolactone  (ALDACTONE) 25 MG tablet Take 25 mg by mouth daily.    [provider]    Allergies Patient has no known allergies.  Family History  Problem Relation Age of Onset  . Heart disease Mother        alive & well.  [name] Heart attack Father 42       died @ 21 of cancer.  . Diabetes Father   . Cancer Father        "in his stomach"  . Cancer Sister   . Cancer Paternal Uncle     Social History Social History   Tobacco Use  . Smoking status: Former Research scientist (life sciences)  . Smokeless tobacco: Never Used  . Tobacco comment: Quit in his 20's.  Substance Use Topics  . Alcohol use: Yes    Alcohol/week: 0.0 oz    Comment: occassional alcohol use at special events  . Drug use: No    Comment: Used marijuana in his teens.    Review of Systems Constitutional: No fever/chills Eyes: No visual changes. ENT: No sore throat. Cardiovascular: Positive for chest pain. Respiratory: Positive for shortness of breath and cough. Gastrointestinal: No abdominal pain.  No nausea.  No diarrhea.   Genitourinary: Negative for dysuria. Musculoskeletal: Negative for back pain. Skin: Negative for rash. Neurological: Negative for headaches, focal weakness or numbness.  ____________________________________________   PHYSICAL EXAM:  VITAL SIGNS: ED Triage Vitals  Enc Vitals Group     BP 01/22/17 1351 (!) 152/89     Pulse Rate 01/22/17 1351 98     Resp 01/22/17 1351 20     Temp 01/22/17 1351 98 F (36.7 C)     Temp Source 01/22/17 1351 Oral     SpO2 01/22/17 1351 96 %     Weight 01/22/17 1352 288 lb (130.6 kg)     Height 01/22/17 1352 5\' 9"  (1.753 m)     Head Circumference --      Peak Flow --      Pain Score 01/22/17 1424 0     Pain Loc --      Pain Edu? --      Excl. in [place]? --    Constitutional: Alert and oriented. Well appearing and in no distress. Eyes: Conjunctivae are normal.  ENT   Head: Normocephalic and atraumatic.   Nose: No congestion/rhinnorhea.   Mouth/Throat: Mucous  membranes are moist.   Neck: No stridor. Hematological/Lymphatic/Immunilogical: No cervical lymphadenopathy. Cardiovascular: Normal rate, regular rhythm.  No murmurs, rubs, or gallops.  Respiratory: Increased respiratory effort. Diffuse expiratory wheezing.  Gastrointestinal: Soft and non tender. No rebound. No guarding.  Genitourinary: Deferred Musculoskeletal: Normal range of motion in all extremities. No lower extremity edema. Neurologic:  Normal speech and language. No gross focal neurologic deficits are appreciated.  Skin:  Skin is warm, dry and intact. No rash noted. Psychiatric: Mood and affect are normal. Speech and behavior are normal. Patient exhibits appropriate insight and judgment.  ____________________________________________  LABS (pertinent positives/negatives)  CBC wbc 9.5, hgb 11.4, plt 284 BMP cr 1.38, glu 161 ____________________________________________   EKG  I, Nance Pear, attending physician, personally viewed and interpreted this EKG  EKG Time: 1358 Rate: 90 Rhythm: normal sinus rhythm Axis: left axis deviation Intervals: qtc 433 QRS: narrow, q waves V1, III ST changes: no st elevation Impression: abnormal ekg   ____________________________________________    RADIOLOGY  CXR Chronic bronchitis changes   ____________________________________________   PROCEDURES  Procedures  ____________________________________________   INITIAL IMPRESSION / ASSESSMENT AND PLAN / ED COURSE  Pertinent labs & imaging results that were available during my care of the patient were reviewed by me and considered in my medical decision making (see chart for details).  Patient presented to the emergency department today with concerns for shortness of breath.  Differential would include CHF, COPD, bronchitis, pneumonia, pneumothorax, PE, cardiac etiology amongst other issues.  Patient's workup was consistent with COPD exacerbation.  During the course  of his stay he was given multiple breathing treatments.  At the end he no longer had wheezing.  He did feel better.  Ambulated without any concerning hypoxia.  Will discharge with further steroids.  Discussed findings workup and plan with patient.   ____________________________________________   FINAL CLINICAL IMPRESSION(S) / ED DIAGNOSES  Final diagnoses:  COPD exacerbation ([place])     Note: This dictation was prepared with Dragon dictation. Any transcriptional errors that result from this process are unintentional     Nance Pear, MD 01/22/17 2247

## 2017-01-22 NOTE — Discharge Instructions (Signed)
Please seek medical attention for any high fevers, chest pain, shortness of breath, change in behavior, persistent vomiting, bloody stool or any other new or concerning symptoms.  

## 2017-01-22 NOTE — ED Notes (Signed)
Pt reports shortness of breath for 1+ weeks - productive cough ("I have not paid any attention to what I'm coughing up") - denies fever

## 2017-01-22 NOTE — ED Triage Notes (Signed)
Pt reports SOB, productive cough and dizziness for one week. Pt reports history of COPD. Denies NVD, denies fever.

## 2017-01-22 NOTE — ED Notes (Signed)
Signature pad not working - pt signed paper copy of discharge

## 2017-01-22 NOTE — ED Notes (Signed)
Pt given crackers and peanut butter, ok per Dr. Archie Balboa.

## 2017-01-22 NOTE — ED Notes (Signed)
Pt O2 sat 97% - ambulated approximately 150 feet - O2 sat 95% but pt does appear short of breath - informed Dr Archie Balboa - he gave vo for hour albuterol neb - RT notified

## 2017-01-22 NOTE — ED Notes (Signed)
Dr. Goodman at bedside.  

## 2017-01-30 ENCOUNTER — Ambulatory Visit: Payer: Self-pay | Admitting: Urology

## 2017-01-30 VITALS — BP 103/64 | HR 90 | Temp 97.6°F | Wt 284.5 lb

## 2017-01-30 DIAGNOSIS — M62830 Muscle spasm of back: Secondary | ICD-10-CM

## 2017-01-30 MED ORDER — CYCLOBENZAPRINE HCL 10 MG PO TABS: 10.0000 mg | ORAL_TABLET | Freq: Three times a day (TID) | ORAL | 0 refills | Status: DC

## 2017-01-30 NOTE — Progress Notes (Signed)
Subjective:    Patient ID: Bradley Pitts., male    DOB: Sep 20, 1965, 52 y.o.   MRN: [id number]  HPI   Bradley Mckenzie. Is a 52 yo male here for recent ED visit on 01/22/17 for COPD exacerbation.  Pt reports his body is sore and he's been feeling sluggish recently.  He reports cramps bilateral on distal sides of mid back, low back, and into posterior thighs. Pt reports he is on fluid restriction and can only drink 4 bottles of H2O/day. He experiences radiating pain at random times for several minutes. He says stretching relieves it. Pt reports past jobs have included lots of heavy lifting and walking. Pt reports no gas or history of kidney stones.  Pt reports he is sleeping w/ his CPAP machine. He reports he recently has a new CPAP machine that he doesn't like and thinks it's not working right. He reports he is waking up with a very dry mouth.    Patient Active Problem List   Diagnosis Date Noted  . Stomach upset 09/19/2016  . Somatic dysfunction of spine, lumbar 08/28/2016  . Segmental dysfunction of cervical region 08/28/2016  . Muscle spasm of back 08/28/2016  . Gynecomastia 12/22/2015  . Dental caries 12/12/2015  . Medication monitoring encounter 12/12/2015  . Low back pain 12/12/2015  . Chronic kidney disease, stage II (mild)   . OSA on CPAP   . Uncontrolled hypertension 07/08/2015  . Cardiomyopathy ([place])   . Hyperlipidemia   . Morbid obesity ([place])   . Diabetes mellitus without complication ([place])   . Hypertensive heart disease   . Sinusitis 11/11/2014  . Tachycardia 09/12/2014  . COPD (chronic obstructive pulmonary disease) ([place]) 08/05/2014  . Chronic systolic heart failure ([place]) 06/16/2014  . Obstructive sleep apnea 06/16/2014  . Essential hypertension 06/16/2014  . H/O respiratory system disease 02/24/2014   Allergies as of 01/30/2017   No Known Allergies     Medication List        Accurate as of 01/30/17  6:42 PM. Always use your most recent med list.          albuterol 108 (90 Base) MCG/ACT inhaler Commonly known as:  PROVENTIL HFA;VENTOLIN HFA Inhale 2 puffs into the lungs every 6 (six) hours as needed for wheezing or shortness of breath. Reported on 07/08/2015   aspirin EC 81 MG tablet Take 1 tablet (81 mg total) by mouth daily.   atorvastatin 10 MG tablet Commonly known as:  LIPITOR Take 1 tablet (10 mg total) daily by mouth.   benzonatate 100 MG capsule Commonly known as:  TESSALON PERLES Take 1 capsule (100 mg total) by mouth every 6 (six) hours as needed for cough.   carvedilol 12.5 MG tablet Commonly known as:  COREG Take 1 tablet (12.5 mg total) by mouth 2 (two) times daily with a meal.   furosemide 40 MG tablet Commonly known as:  LASIX TAKE ONE TABLET BY MOUTH 2 TIMES A DAY   hydrALAZINE 25 MG tablet Commonly known as:  APRESOLINE TAKE ONE TABLET BY MOUTH 2 TIMES A DAY   lisinopril 20 MG tablet Commonly known as:  PRINIVIL,ZESTRIL Take 2 tablets (40 mg total) by mouth daily.   metFORMIN 850 MG tablet Commonly known as:  GLUCOPHAGE Take 1 tablet (850 mg total) 2 (two) times daily with a meal by mouth.   naproxen 250 MG tablet Commonly known as:  NAPROSYN Take 1 tablet (250 mg total) by mouth 2 (two) times daily with a  meal.   potassium chloride SA 20 MEQ tablet Commonly known as:  K-DUR,KLOR-CON Take 1 tablet (20 mEq total) by mouth daily.   predniSONE 20 MG tablet Commonly known as:  DELTASONE Take 2 tablets (40 mg total) by mouth daily.   spironolactone 25 MG tablet Commonly known as:  ALDACTONE Take 25 mg by mouth daily.        Review of Systems  Back pain - pt has seen Dr. Vickki Hearing, [facility], and is waiting for [facility] apt. Review of Dr. Vickki Hearing note shows bulging discs in lower back. Pt reports at visits w/ [place] and Jonni Sanger, this current back pain was not an issue. Pt reports he still has Naproxen PRN and gets some relief. Pt denies problems w/ urination.      Objective:   Physical Exam  Constitutional:  He is oriented to person, place, and time. He appears well-developed and well-nourished.  Neurological: He is alert and oriented to person, place, and time.  Psychiatric: He has a normal mood and affect. His behavior is normal.    BP 103/64 (BP Location: Left Arm, Patient Position: Sitting, Cuff Size: Large)   Pulse 90   Temp 97.6 F (36.4 C)   Wt 284 lb 8 oz (129 kg)   BMI 42.01 kg/m   Paraspinal muscles are very tight bilaterally      Assessment & Plan:   F/u w/ [facility] apt.  Refer to Dr. Vickki Hearing for mid-back pain.

## 2017-02-05 ENCOUNTER — Ambulatory Visit: Payer: Self-pay | Admitting: Specialist

## 2017-02-05 DIAGNOSIS — M5440 Lumbago with sciatica, unspecified side: Secondary | ICD-10-CM

## 2017-02-05 NOTE — Progress Notes (Signed)
   Subjective:    Patient ID: Bradley Pitts., male    DOB: 1965/04/09, 52 y.o.   MRN: [id number]  HPI The ED and cliinic notes were reviewed. He still has not been able to get into [facility]. Hee was told he was on a waiting list although we have been told by one of their therapists, there is no list. I am going to try and have [place] try to expedite getting him into [facility]. A HEP and activity modification is the best we have to offer him.    Review of Systems     Objective:   Physical Exam  Deferred.      Assessment & Plan:  Get him into PT.

## 2017-02-13 ENCOUNTER — Other Ambulatory Visit: Payer: Self-pay | Admitting: Family

## 2017-02-13 MED ORDER — IPRATROPIUM-ALBUTEROL 0.5-2.5 (3) MG/3ML IN SOLN: 3.0000 mL | RESPIRATORY_TRACT | 5 refills | Status: DC | PRN

## 2017-02-13 NOTE — Progress Notes (Signed)
Patient presented to office saying that he was given nebulizer treatment in the ER which improved his breathing. Will send RX for duonebs to medication management clinic and see patient in the office next week.

## 2017-02-26 NOTE — Progress Notes (Signed)
Patient ID: Bradley Osuna., male    DOB: 05-Sep-1965, 52 y.o.   MRN: [id number]  HPI  Bradley Mckenzie is a 52 y/o male who has a history of diabetes, HTN, obstructive sleep apnea, depression, hyperlipidemia, morbid obesity and heart failure with a reduced ejection fraction. Remote tobacco exposure.   Last echo was done 09/02/15 with an EF of 30-35%, mild mitral regurgitation and no aortic stenosis. EF has declined from 40-45% in March 2017 but he had been inconsistent taking his medications & not using his CPAP during this time.  Was in the ED 01/22/17 due to COPD exacerbation where he was treated and released.   Returns today for a follow-up visit with a chief complaint of minimal fatigue upon moderate exertion. He describes this as chronic in nature having been present for several years with varying levels of severity. He has associated head congestion, cough, vomiting (after coughing) shortness of breath, wheezing, edema, light-headedness and difficulty sleeping even with CPAP on. He denies any chest pain, palpitations, abdominal distention or weight gain. Has been using his nebulizer but inconsistently.   Past Medical History:  Diagnosis Date  . Cardiomyopathy ([place])    a. 01/2014 Echo: EF 25-30%;  b. ? ischemic vs non-ischemic.  He's never had an ischemic eval.  . Chronic combined systolic (congestive) and diastolic (congestive) heart failure ([place])    a. 01/2014 Echo: EF 25-30%. b. Echo 03/2015: Improved EF of 40-45%  . Depression   . Diabetes mellitus without complication ([place])    a. Not previously on outpt meds.  . Hyperlipidemia   . Hypertensive heart disease    a. Since his 59's.  . Morbid obesity ([place])   . Obstructive sleep apnea    a. Has not used CPAP since ~ 2012.   Past Surgical History:  Procedure Laterality Date  . HERNIA REPAIR  2004   Family History  Problem Relation Age of Onset  . Heart disease Mother        alive & well.  [name] Heart attack Father 64       died @ 1 of  cancer.  . Diabetes Father   . Cancer Father        "in his stomach"  . Cancer Sister   . Cancer Paternal Uncle    Social History   Tobacco Use  . Smoking status: Former Research scientist (life sciences)  . Smokeless tobacco: Never Used  . Tobacco comment: Quit in his 35's.  Substance Use Topics  . Alcohol use: Yes    Alcohol/week: 0.0 oz    Comment: occassional alcohol use at special events   Prior to Admission medications   Medication Sig Start Date End Date Taking? Authorizing Provider  albuterol (PROVENTIL HFA;VENTOLIN HFA) 108 (90 Base) MCG/ACT inhaler Inhale 2 puffs into the lungs every 6 (six) hours as needed for wheezing or shortness of breath. Reported on 07/08/2015 12/04/16  Yes Darylene Price A, FNP  aspirin EC 81 MG tablet Take 1 tablet (81 mg total) by mouth daily. 07/09/16  Yes Darylene Price A, FNP  atorvastatin (LIPITOR) 10 MG tablet Take 1 tablet (10 mg total) daily by mouth. 11/28/16  Yes Doles-Johnson, Teah, NP  carvedilol (COREG) 12.5 MG tablet Take 12.5 mg by mouth 2 (two) times daily with a meal.   Yes [provider]  furosemide (LASIX) 40 MG tablet TAKE ONE TABLET BY MOUTH 2 TIMES A DAY 11/29/16  Yes Darylene Price A, FNP  hydrALAZINE (APRESOLINE) 25 MG tablet TAKE ONE TABLET BY  MOUTH 2 TIMES A DAY 11/29/16  Yes Odas Ozer A, FNP  ipratropium-albuterol (DUONEB) 0.5-2.5 (3) MG/3ML SOLN Take 3 mLs by nebulization every 4 (four) hours as needed. 02/13/17  Yes Meldrick Buttery, Otila Kluver A, FNP  lisinopril (PRINIVIL,ZESTRIL) 20 MG tablet Take 2 tablets (40 mg total) by mouth daily. 07/09/16  Yes Alisa Graff, FNP  metFORMIN (GLUCOPHAGE) 850 MG tablet Take 1 tablet (850 mg total) 2 (two) times daily with a meal by mouth. 11/28/16  Yes Doles-Johnson, Teah, NP  naproxen (NAPROSYN) 250 MG tablet Take 1 tablet (250 mg total) by mouth 2 (two) times daily with a meal. Patient taking differently: Take 125 mg by mouth daily as needed for mild pain.  12/26/16  Yes Azzie Glatter, FNP  potassium chloride  SA (K-DUR,KLOR-CON) 20 MEQ tablet Take 1 tablet (20 mEq total) by mouth daily. 12/04/16 03/04/17 Yes Kam Rahimi, Otila Kluver A, FNP  spironolactone (ALDACTONE) 25 MG tablet Take 25 mg by mouth daily.   Yes [provider]    Review of Systems  Constitutional: Positive for fatigue. Negative for appetite change.  HENT: Positive for congestion. Negative for postnasal drip.   Eyes: Negative.   Respiratory: Positive for cough, shortness of breath and wheezing. Negative for chest tightness.   Cardiovascular: Positive for leg swelling (minimal). Negative for chest pain and palpitations.  Gastrointestinal: Positive for vomiting (after coughing hard). Negative for abdominal distention, abdominal pain and nausea.  Endocrine: Negative.   Genitourinary: Negative.   Musculoskeletal: Negative for back pain and neck pain.  Skin: Negative.   Allergic/Immunologic: Negative.   Neurological: Positive for light-headedness (when coughing too much). Negative for dizziness.  Hematological: Negative for adenopathy. Does not bruise/bleed easily.  Psychiatric/Behavioral: Positive for sleep disturbance (due to cough). Negative for dysphoric mood and suicidal ideas. The patient is not nervous/anxious.    Vitals:   02/27/17 1038  BP: 133/72  Pulse: 96  Resp: 18  SpO2: 97%  Weight: 286 lb 8 oz (130 kg)  Height: 5\' 9"  (1.753 m)   Wt Readings from Last 3 Encounters:  02/27/17 286 lb 8 oz (130 kg)  01/30/17 284 lb 8 oz (129 kg)  01/22/17 288 lb (130.6 kg)   Lab Results  Component Value Date   CREATININE 1.38 (H) 01/22/2017   CREATININE 1.49 (H) 12/19/2016   CREATININE 1.46 (H) 10/17/2016    Physical Exam  Constitutional: He is oriented to person, place, and time. He appears well-developed and well-nourished.  HENT:  Head: Normocephalic and atraumatic.  Neck: Normal range of motion. Neck supple. No JVD present.  Cardiovascular: Normal rate and regular rhythm.  Pulmonary/Chest: Effort normal. He has  wheezes in the right lower field. He has no rales.  Abdominal: Soft. He exhibits no distension. There is no tenderness.  Musculoskeletal: He exhibits no edema or tenderness.  Neurological: He is alert and oriented to person, place, and time.  Skin: Skin is warm and dry.  Psychiatric: He has a normal mood and affect. His behavior is normal.  Nursing note and vitals reviewed.   Assessment & Plan:  1: Chronic heart failure with reduced ejection fraction-  - NYHA Class II - euvolemic today - weighing daily and he was reminded to call for an overnight weight gain of >2 pounds or a weekly weight gain of >5 pounds - trying to eat low sodium foods when he can but says it's difficult at times due to finances - drinking 64 ounces of water daily - ReDS vest reading not done  due to BMI - PharmD reconciled medications with the patient  2: HTN- - BP looks good today - went to [facility] on 02/05/17 - BMP on 01/22/17 reviewed and shows sodium 136, potassium 3.7 and GFR >60  3: Obstructive sleep apnea- - unable to wear CPAP due to leaking of the mask and a severely dry mouth - has to contact [facility] about repeating sleep study  4: Diabetes- - fasting glucose yesterday at home was 166 - A1c on 10/17/16 was 8.3%  5: COPD-  - has been using albuterol and nebulizer as needed with some relief at times - using nebulizer 2-3 times daily - he says that he's coughed so hard at times that it makes him dizzy and/or vomit. Encouraged him to use the nebulizer 3-4 times daily consistently to see if the cough improves  Patient did not bring his medications nor a list. Each medication was verbally reviewed with the patient and he was encouraged to bring the bottles to every visit to confirm accuracy of list.  Return in 2 months or sooner for any questions/problems before then.

## 2017-02-27 ENCOUNTER — Encounter: Payer: Self-pay | Admitting: Family

## 2017-02-27 ENCOUNTER — Ambulatory Visit: Payer: Self-pay | Attending: Family | Admitting: Family

## 2017-02-27 VITALS — BP 133/72 | HR 96 | Resp 18 | Ht 69.0 in | Wt 286.5 lb

## 2017-02-27 DIAGNOSIS — G4733 Obstructive sleep apnea (adult) (pediatric): Secondary | ICD-10-CM | POA: Insufficient documentation

## 2017-02-27 DIAGNOSIS — Z7984 Long term (current) use of oral hypoglycemic drugs: Secondary | ICD-10-CM | POA: Insufficient documentation

## 2017-02-27 DIAGNOSIS — I340 Nonrheumatic mitral (valve) insufficiency: Secondary | ICD-10-CM | POA: Insufficient documentation

## 2017-02-27 DIAGNOSIS — I429 Cardiomyopathy, unspecified: Secondary | ICD-10-CM | POA: Insufficient documentation

## 2017-02-27 DIAGNOSIS — Z87891 Personal history of nicotine dependence: Secondary | ICD-10-CM | POA: Insufficient documentation

## 2017-02-27 DIAGNOSIS — Z9989 Dependence on other enabling machines and devices: Secondary | ICD-10-CM

## 2017-02-27 DIAGNOSIS — I5022 Chronic systolic (congestive) heart failure: Secondary | ICD-10-CM

## 2017-02-27 DIAGNOSIS — E785 Hyperlipidemia, unspecified: Secondary | ICD-10-CM | POA: Insufficient documentation

## 2017-02-27 DIAGNOSIS — F329 Major depressive disorder, single episode, unspecified: Secondary | ICD-10-CM | POA: Insufficient documentation

## 2017-02-27 DIAGNOSIS — I5042 Chronic combined systolic (congestive) and diastolic (congestive) heart failure: Secondary | ICD-10-CM | POA: Insufficient documentation

## 2017-02-27 DIAGNOSIS — Z79899 Other long term (current) drug therapy: Secondary | ICD-10-CM | POA: Insufficient documentation

## 2017-02-27 DIAGNOSIS — J449 Chronic obstructive pulmonary disease, unspecified: Secondary | ICD-10-CM | POA: Insufficient documentation

## 2017-02-27 DIAGNOSIS — E119 Type 2 diabetes mellitus without complications: Secondary | ICD-10-CM | POA: Insufficient documentation

## 2017-02-27 DIAGNOSIS — Z7982 Long term (current) use of aspirin: Secondary | ICD-10-CM | POA: Insufficient documentation

## 2017-02-27 DIAGNOSIS — I110 Hypertensive heart disease with heart failure: Secondary | ICD-10-CM | POA: Insufficient documentation

## 2017-02-27 DIAGNOSIS — I10 Essential (primary) hypertension: Secondary | ICD-10-CM

## 2017-02-27 NOTE — Patient Instructions (Addendum)
Continue weighing daily and call for an overnight weight gain of > 2 pounds or a weekly weight gain of >5 pounds.  Call [facility] about your sleep study  Use the nebulizer routinely to see if the cough improves.

## 2017-03-05 ENCOUNTER — Telehealth: Payer: Self-pay | Admitting: Family

## 2017-03-05 MED ORDER — LOSARTAN POTASSIUM 100 MG PO TABS: 100.0000 mg | ORAL_TABLET | Freq: Every day | ORAL | 3 refills | Status: DC

## 2017-03-05 NOTE — Telephone Encounter (Signed)
Patient says that he's continued with this dry, hacky cough. He says that he coughed so hard last night that he made himself dizzy and "blacked out" and fell onto the bed. Immediately woke up without injury.  He was wondering if the lisinopril could be the cause of his cough. He has been on lisinopril for quite some time and he's not sure how long the cough has been present. He knows that over the last few months, his cough has gotten much worse but he can't say whether it started with the lisinopril or not.  Will stop the lisinopril and begin losartan 100mg  daily. Should the cough continue, he's to make an appointment with his PCP at [facility]. [facility].

## 2017-03-06 ENCOUNTER — Ambulatory Visit: Payer: Self-pay | Admitting: Family

## 2017-03-19 ENCOUNTER — Telehealth: Payer: Self-pay | Admitting: Family

## 2017-03-19 NOTE — Telephone Encounter (Signed)
Patient called to say that he's been having loose stools for "awhile now" but it's getting worse. He says that he's having loose stools almost every hour and that at times, his stomach hurts when he has to use the bathroom. Denies having issues with constipation.   Has been on metformin BID since November 2018 at the 850mg  dose. Prior to that, he was on a lower dose but he can't say whether this has been going on since then or not, just that it's become much more frequent.   Discussed his medications and that metformin can sometimes cause loose stools. Will have the pharmacist review his medications to see if anything else that he's taking could be contributing to this. Advised him to hold his metformin today and tomorrow until he can reach is PCP as he says [facility] is currently closed today and will re-open tomorrow evening. It may be that his symptoms are unrelated to his medications and could be related to an underlying GI issue.  Patient was comfortable with this plan and says that he will check his glucose levels while off the metformin and call his PCP tomorrow evening when they reopen.

## 2017-03-20 ENCOUNTER — Encounter: Payer: Self-pay | Admitting: Adult Health

## 2017-03-20 ENCOUNTER — Ambulatory Visit: Payer: Self-pay | Admitting: Adult Health

## 2017-03-20 VITALS — BP 141/78 | HR 107 | Temp 98.3°F | Wt 287.1 lb

## 2017-03-20 DIAGNOSIS — K521 Toxic gastroenteritis and colitis: Secondary | ICD-10-CM

## 2017-03-20 DIAGNOSIS — E119 Type 2 diabetes mellitus without complications: Secondary | ICD-10-CM

## 2017-03-20 DIAGNOSIS — E118 Type 2 diabetes mellitus with unspecified complications: Secondary | ICD-10-CM | POA: Insufficient documentation

## 2017-03-20 DIAGNOSIS — I5022 Chronic systolic (congestive) heart failure: Secondary | ICD-10-CM

## 2017-03-20 DIAGNOSIS — J441 Chronic obstructive pulmonary disease with (acute) exacerbation: Secondary | ICD-10-CM

## 2017-03-20 MED ORDER — PREDNISONE 5 MG PO TABS: 5.0000 mg | ORAL_TABLET | Freq: Every day | ORAL | 0 refills | Status: DC

## 2017-03-20 MED ORDER — BENZONATATE 100 MG PO CAPS: 100.0000 mg | ORAL_CAPSULE | Freq: Three times a day (TID) | ORAL | 0 refills | Status: AC

## 2017-03-20 MED ORDER — LEVOFLOXACIN 500 MG PO TABS: 500.0000 mg | ORAL_TABLET | Freq: Every day | ORAL | 0 refills | Status: DC

## 2017-03-20 MED ORDER — IPRATROPIUM-ALBUTEROL 0.5-2.5 (3) MG/3ML IN SOLN: 3.0000 mL | RESPIRATORY_TRACT | 5 refills | Status: DC | PRN

## 2017-03-20 MED ORDER — GLIPIZIDE 5 MG PO TABS: 5.0000 mg | ORAL_TABLET | Freq: Two times a day (BID) | ORAL | 3 refills | Status: DC

## 2017-03-20 NOTE — Patient Instructions (Signed)
Metformin extended-release tablets What is this medicine? METFORMIN (met FOR min) is used to treat type 2 diabetes. It helps to control blood sugar. Treatment is combined with diet and exercise. Glipizide tablets What is this medicine? GLIPIZIDE (GLIP i zide) helps to treat type 2 diabetes. Treatment is combined with diet and exercise. The medicine helps your body to use insulin better. This medicine may be used for other purposes; ask your health care provider or pharmacist if you have questions. COMMON BRAND NAME(S): Glucotrol What should I tell my health care provider before I take this medicine? They need to know if you have any of these conditions: -diabetic ketoacidosis -glucose-6-phosphate dehydrogenase deficiency -heart disease -kidney disease -liver disease -porphyria -severe infection or injury -thyroid disease -an unusual or allergic reaction to glipizide, sulfa drugs, other medicines, foods, dyes, or preservatives -pregnant or trying to get pregnant -breast-feeding How should I use this medicine? Take this medicine by mouth. Swallow with a drink of water. Do not take with food. Take it 30 minutes before a meal. Follow the directions on the prescription label. If you take this medicine once a day, take it 30 minutes before breakfast. Take your doses at the same time each day. Do not take more often than directed. Talk to your pediatrician regarding the use of this medicine in children. Special care may be needed. Elderly patients over 20 years old may have a stronger reaction and need a smaller dose. Overdosage: If you think you have taken too much of this medicine contact a poison control center or emergency room at once. NOTE: This medicine is only for you. Do not share this medicine with others. What if I miss a dose? If you miss a dose, take it as soon as you can. If it is almost time for your next dose, take only that dose. Do not take double or extra doses. What may  interact with this medicine? -bosentan -chloramphenicol -cisapride -clarithromycin -medicines for fungal or yeast infections -metoclopramide -probenecid -warfarin Many medications may cause an increase or decrease in blood sugar, these include: -alcohol containing beverages -aspirin and aspirin-like drugs -chloramphenicol -chromium -diuretics -male hormones, like estrogens or progestins and birth control pills -heart medicines -isoniazid -male hormones or anabolic steroids -medicines for weight loss -medicines for allergies, asthma, cold, or cough -medicines for mental problems -medicines called MAO Inhibitors like Nardil, Parnate, Marplan, Eldepryl -niacin -NSAIDs, medicines for pain and inflammation, like ibuprofen or naproxen -pentamidine -phenytoin -probenecid -quinolone antibiotics like ciprofloxacin, levofloxacin, ofloxacin -some herbal dietary supplements -steroid medicines like prednisone or cortisone -thyroid medicine This list may not describe all possible interactions. Give your health care provider a list of all the medicines, herbs, non-prescription drugs, or dietary supplements you use. Also tell them if you smoke, drink alcohol, or use illegal drugs. Some items may interact with your medicine. What should I watch for while using this medicine? Visit your doctor or health care professional for regular checks on your progress. A test called the HbA1C (A1C) will be monitored. This is a simple blood test. It measures your blood sugar control over the last 2 to 3 months. You will receive this test every 3 to 6 months. Learn how to check your blood sugar. Learn the symptoms of low and high blood sugar and how to manage them. Always carry a quick-source of sugar with you in case you have symptoms of low blood sugar. Examples include hard sugar candy or glucose tablets. Make sure others know that you can choke  if you eat or drink when you develop serious symptoms of low  blood sugar, such as seizures or unconsciousness. They must get medical help at once. Tell your doctor or health care professional if you have high blood sugar. You might need to change the dose of your medicine. If you are sick or exercising more than usual, you might need to change the dose of your medicine. Do not skip meals. Ask your doctor or health care professional if you should avoid alcohol. Many nonprescription cough and cold products contain sugar or alcohol. These can affect blood sugar. This medicine can make you more sensitive to the sun. Keep out of the sun. If you cannot avoid being in the sun, wear protective clothing and use sunscreen. Do not use sun lamps or tanning beds/booths. Wear a medical ID bracelet or chain, and carry a card that describes your disease and details of your medicine and dosage times. What side effects may I notice from receiving this medicine? Side effects that you should report to your doctor or health care professional as soon as possible: -allergic reactions like skin rash, itching or hives, swelling of the face, lips, or tongue -breathing problems -dark urine -fever, chills, sore throat -signs and symptoms of low blood sugar such as feeling anxious, confusion, dizziness, increased hunger, unusually weak or tired, sweating, shakiness, cold, irritable, headache, blurred vision, fast heartbeat, loss of consciousness -unusual bleeding or bruising -yellowing of the eyes or skin Side effects that usually do not require medical attention (report to your doctor or health care professional if they continue or are bothersome): -diarrhea -dizziness -headache -heartburn -nausea -stomach gas This list may not describe all possible side effects. Call your doctor for medical advice about side effects. You may report side effects to FDA at 1-800-FDA-1088. Where should I keep my medicine? Keep out of the reach of children. Store at room temperature below 30 degrees  C (86 degrees F). Throw away any unused medicine after the expiration date. NOTE: This sheet is a summary. It may not cover all possible information. If you have questions about this medicine, talk to your doctor, pharmacist, or health care provider.  2018 Elsevier/Gold Standard (2012-04-15 14:42:46) This medicine can be used alone or with other medicines for diabetes. This medicine may be used for other purposes; ask your health care provider or pharmacist if you have questions. COMMON BRAND NAME(S): Fortamet, Glucophage XR, Glumetza What should I tell my health care provider before I take this medicine? They need to know if you have any of these conditions: -anemia -dehydration -heart disease -frequently drink alcohol-containing beverages -kidney disease -liver disease -polycystic ovary syndrome -serious infection or injury -vomiting -an unusual or allergic reaction to metformin, other medicines, foods, dyes, or preservatives -pregnant or trying to get pregnant -breast-feeding How should I use this medicine? Take this medicine by mouth with a glass of water. Follow the directions on the prescription label. Take this medicine with food. Take your medicine at regular intervals. Do not take your medicine more often than directed. Do not stop taking except on your doctor's advice. Talk to your pediatrician regarding the use of this medicine in children. Special care may be needed. Overdosage: If you think you have taken too much of this medicine contact a poison control center or emergency room at once. NOTE: This medicine is only for you. Do not share this medicine with others. What if I miss a dose? If you miss a dose, take it as soon as  you can. If it is almost time for your next dose, take only that dose. Do not take double or extra doses. What may interact with this medicine? Do not take this medicine with any of the following medications: -dofetilide -certain contrast medicines  given before X-rays, CT scans, MRI, or other procedures This medicine may also interact with the following medications: -acetazolamide -certain antiviral medicines for HIV or AIDS or for hepatitis, like adefovir, dolutegravir, emtricitabine, entecavir, lamivudine, paritaprevir, or tenofovir -cimetidine -cobicistat -crizotinib -dichlorphenamide -digoxin -diuretics -male hormones, like estrogens or progestins and birth control pills -glycopyrrolate -isoniazid -lamotrigine -medicines for blood pressure, heart disease, irregular heart beat -memantine -midodrine -methazolamide -morphine -niacin -phenothiazines like chlorpromazine, mesoridazine, prochlorperazine, thioridazine -phenytoin -procainamide -propantheline -quinidine -quinine -ranitidine -ranolazine -steroid medicines like prednisone or cortisone -stimulant medicines for attention disorders, weight loss, or to stay awake -thyroid medicines -topiramate -trimethoprim -trospium -vancomycin -vandetanib -zonisamide This list may not describe all possible interactions. Give your health care provider a list of all the medicines, herbs, non-prescription drugs, or dietary supplements you use. Also tell them if you smoke, drink alcohol, or use illegal drugs. Some items may interact with your medicine. What should I watch for while using this medicine? Visit your doctor or health care professional for regular checks on your progress. A test called the HbA1C (A1C) will be monitored. This is a simple blood test. It measures your blood sugar control over the last 2 to 3 months. You will receive this test every 3 to 6 months. Learn how to check your blood sugar. Learn the symptoms of low and high blood sugar and how to manage them. Always carry a quick-source of sugar with you in case you have symptoms of low blood sugar. Examples include hard sugar candy or glucose tablets. Make sure others know that you can choke if you eat or drink  when you develop serious symptoms of low blood sugar, such as seizures or unconsciousness. They must get medical help at once. Tell your doctor or health care professional if you have high blood sugar. You might need to change the dose of your medicine. If you are sick or exercising more than usual, you might need to change the dose of your medicine. Do not skip meals. Ask your doctor or health care professional if you should avoid alcohol. Many nonprescription cough and cold products contain sugar or alcohol. These can affect blood sugar. This medicine may cause ovulation in premenopausal women who do not have regular monthly periods. This may increase your chances of becoming pregnant. You should not take this medicine if you become pregnant or think you may be pregnant. Talk with your doctor or health care professional about your birth control options while taking this medicine. Contact your doctor or health care professional right away if think you are pregnant. The tablet shell for some brands of this medicine does not dissolve. This is normal. The tablet shell may appear whole in the stool. This is not a cause for concern. If you are going to need surgery, a MRI, CT scan, or other procedure, tell your doctor that you are taking this medicine. You may need to stop taking this medicine before the procedure. Wear a medical ID bracelet or chain, and carry a card that describes your disease and details of your medicine and dosage times. What side effects may I notice from receiving this medicine? Side effects that you should report to your doctor or health care professional as soon as possible: -allergic reactions  like skin rash, itching or hives, swelling of the face, lips, or tongue -breathing problems -feeling faint or lightheaded, falls -muscle aches or pains -signs and symptoms of low blood sugar such as feeling anxious, confusion, dizziness, increased hunger, unusually weak or tired, sweating,  shakiness, cold, irritable, headache, blurred vision, fast heartbeat, loss of consciousness -slow or irregular heartbeat -unusual stomach pain or discomfort -unusually tired or weak Side effects that usually do not require medical attention (report to your doctor or health care professional if they continue or are bothersome): -diarrhea -headache -heartburn -metallic taste in mouth -nausea -stomach gas, upset This list may not describe all possible side effects. Call your doctor for medical advice about side effects. You may report side effects to FDA at 1-800-FDA-1088. Where should I keep my medicine? Keep out of the reach of children. Store at room temperature between 15 and 30 degrees C (59 and 86 degrees F). Protect from light. Throw away any unused medicine after the expiration date. NOTE: This sheet is a summary. It may not cover all possible information. If you have questions about this medicine, talk to your doctor, pharmacist, or health care provider.  2018 Elsevier/Gold Standard (2015-07-12 15:47:35)

## 2017-03-20 NOTE — Progress Notes (Signed)
Patient: Bradley Mckenzie. Male    DOB: 06-26-1965   52 y.o.   MRN: [id number] Visit Date: 03/20/2017  Today's Provider: Mary Sella, NP   Chief Complaint  Patient presents with  . Follow-up    GI Upset  Diarrhea x 3 weeks and URI symptoms Subjective:    HPI: This is a 52 y/o male with diabetes and COPD presenting with cough, congestion and diarrhea. States that diarrhea has been going on for a while but over the last couple days, he has been having 4-5 loose stools per day. Denies laxative use. Spoke with NP from heart failure clinic and was told to stop metformin. Diarrhea has improved since stopping metformin. He denies bloody stools, nausea, vomiting and abdominal cramping.  He is also c/o a productive cough, wheezing, increased SOB with minimal exertion and congestion. Sputum is yellow. No associated fever/chills. He does not smoke but reports significant exposure to second hand smoke.  He was seen in the ED in January with same symptoms. He reports using 2-3 nebs/day. He needs refills on Nebs  He reports taking all his medications as prescribed.    No Known Allergies Previous Medications   ALBUTEROL (PROVENTIL HFA;VENTOLIN HFA) 108 (90 BASE) MCG/ACT INHALER    Inhale 2 puffs into the lungs every 6 (six) hours as needed for wheezing or shortness of breath. Reported on 07/08/2015   ASPIRIN EC 81 MG TABLET    Take 1 tablet (81 mg total) by mouth daily.   ATORVASTATIN (LIPITOR) 10 MG TABLET    Take 1 tablet (10 mg total) daily by mouth.   CARVEDILOL (COREG) 12.5 MG TABLET    Take 12.5 mg by mouth 2 (two) times daily with a meal.   FUROSEMIDE (LASIX) 40 MG TABLET    TAKE ONE TABLET BY MOUTH 2 TIMES A DAY   HYDRALAZINE (APRESOLINE) 25 MG TABLET    TAKE ONE TABLET BY MOUTH 2 TIMES A DAY   IPRATROPIUM-ALBUTEROL (DUONEB) 0.5-2.5 (3) MG/3ML SOLN    Take 3 mLs by nebulization every 4 (four) hours as needed.   LOSARTAN (COZAAR) 100 MG TABLET    Take 1 tablet (100 mg total) by mouth daily.   METFORMIN (GLUCOPHAGE) 850 MG TABLET    Take 1 tablet (850 mg total) 2 (two) times daily with a meal by mouth.   NAPROXEN (NAPROSYN) 250 MG TABLET    Take 1 tablet (250 mg total) by mouth 2 (two) times daily with a meal.   POTASSIUM CHLORIDE SA (K-DUR,KLOR-CON) 20 MEQ TABLET    Take 1 tablet (20 mEq total) by mouth daily.   SPIRONOLACTONE (ALDACTONE) 25 MG TABLET    Take 25 mg by mouth daily.    Review of Systems  Constitutional: Negative for fever and chills.  HENT: positive for congestion and rhinorrhea.  Eyes: Negative for redness and visual disturbance.  Respiratory:positive for shortness of breath, productive cough and wheezing.  Cardiovascular: Negative for chest pain and palpitations.  Gastrointestinal:positive for diarrhea but  negative, vomiting and abdominal pain   Social History   Tobacco Use  . Smoking status: Former Research scientist (life sciences)  . Smokeless tobacco: Never Used  . Tobacco comment: Quit in his 33's.  Substance Use Topics  . Alcohol use: Yes    Alcohol/week: 0.0 oz    Comment: occassional alcohol use at special events   Objective:   BP (!) 141/78   Pulse (!) 107   Temp 98.3 F (36.8 C)   Wt 287 lb 1.6 oz (  130.2 kg)   BMI 42.40 kg/m   Physical Exam Constitutional: well nourished;  NAD HENT: PERRLA Cardiovascular: Normal rate and regular rhythm, no murmur, gallop and no friction rub, no edema Pulmonary/Chest: Effort normal, BL breaths sounds with expiratory wheezes in right lung fields. Abdominal: Obese, normal BS X4; no guarding, rebound or referred rebound tenderness.  Skin: warm and dry.     Assessment & Plan:  1. Diarrhea due to METFORMIN -D/C metformin -Increase fluid intake -Avoid fatty foods x 3 days -RTC if diarrhea worsens  2. COPD with exacerbation ([place]) as evidenced by increased wheezing, productive cough and worsening dyspnea -Continue duonebs Q4H prn -Prednisone 5mg  po dialy x 10 days -Levaquin 500mg  daily x 5 days -Avoid smoking areas  3.  Diabetes mellitus without complication ([place]) -Start glipizide 5mg  AC  breakfast and Dinner -Monitor blood glucose and keep log if possible -Educated on risk of hypoglycemia, symptoms and treatment  RTC in 1 week   Mary Sella, NP   [facility] [place]

## 2017-03-27 ENCOUNTER — Ambulatory Visit: Payer: Self-pay | Admitting: Adult Health Nurse Practitioner

## 2017-03-27 VITALS — BP 140/82 | HR 93 | Temp 98.1°F | Wt 280.8 lb

## 2017-03-27 DIAGNOSIS — I10 Essential (primary) hypertension: Secondary | ICD-10-CM

## 2017-03-27 DIAGNOSIS — M549 Dorsalgia, unspecified: Secondary | ICD-10-CM

## 2017-03-27 DIAGNOSIS — J449 Chronic obstructive pulmonary disease, unspecified: Secondary | ICD-10-CM

## 2017-03-27 DIAGNOSIS — E119 Type 2 diabetes mellitus without complications: Secondary | ICD-10-CM

## 2017-03-27 MED ORDER — GUAIFENESIN 100 MG/5ML PO SOLN: 5.0000 mL | ORAL | 0 refills | Status: DC | PRN

## 2017-03-27 NOTE — Progress Notes (Signed)
Subjective:    Patient ID: Bradley Pitts., male    DOB: Mar 18, 1965, 52 y.o.   MRN: [id number]  HPI  Bradley Mckenzie. Is a 52 yo male here for f/u of Diabetes and COPD. He was started on Glipizide 1 week ago. Pt reports diarrhea is gone. Pt reports he forgets to take Glipizide at night but he takes it in the morning.  COPD: He was started on Benzonatate, Levofloxacin, and Prednisone 1 week ago. He reports his coughing attacks cause him to have blurry vision and body shaking and sometimes vomitting. Pt reports he blacked out from coughing 1 time on Tuesday. Pt reports when he lie down, coughing increases but he is able to sleep through the night. He endorses sputum, clear to yellow. He reports his coughing has improved since Tuesday.   Patient Active Problem List   Diagnosis Date Noted  . Type 2 diabetes mellitus with complication ([place]) 86/76/1949  . Stomach upset 09/19/2016  . Somatic dysfunction of spine, lumbar 08/28/2016  . Segmental dysfunction of cervical region 08/28/2016  . Muscle spasm of back 08/28/2016  . Gynecomastia 12/22/2015  . Dental caries 12/12/2015  . Medication monitoring encounter 12/12/2015  . Low back pain 12/12/2015  . Chronic kidney disease, stage II (mild)   . OSA on CPAP   . Uncontrolled hypertension 07/08/2015  . Cardiomyopathy ([place])   . Hyperlipidemia   . Morbid obesity ([place])   . Diabetes mellitus without complication ([place])   . Hypertensive heart disease   . Sinusitis 11/11/2014  . Tachycardia 09/12/2014  . COPD (chronic obstructive pulmonary disease) ([place]) 08/05/2014  . Chronic systolic heart failure ([place]) 06/16/2014  . Obstructive sleep apnea 06/16/2014  . Essential hypertension 06/16/2014  . H/O respiratory system disease 02/24/2014   Allergies as of 03/27/2017   No Known Allergies     Medication List        Accurate as of 03/27/17  7:36 PM. Always use your most recent med list.          aspirin EC 81 MG tablet Take 1 tablet (81 mg  total) by mouth daily.   atorvastatin 10 MG tablet Commonly known as:  LIPITOR Take 1 tablet (10 mg total) daily by mouth.   carvedilol 12.5 MG tablet Commonly known as:  COREG Take 12.5 mg by mouth 2 (two) times daily with a meal.   furosemide 40 MG tablet Commonly known as:  LASIX TAKE ONE TABLET BY MOUTH 2 TIMES A DAY   glipiZIDE 5 MG tablet Commonly known as:  GLUCOTROL Take 1 tablet (5 mg total) by mouth 2 (two) times daily before a meal.   hydrALAZINE 25 MG tablet Commonly known as:  APRESOLINE TAKE ONE TABLET BY MOUTH 2 TIMES A DAY   ipratropium-albuterol 0.5-2.5 (3) MG/3ML Soln Commonly known as:  DUONEB Take 3 mLs by nebulization every 4 (four) hours as needed.   levofloxacin 500 MG tablet Commonly known as:  LEVAQUIN Take 1 tablet (500 mg total) by mouth daily.   losartan 100 MG tablet Commonly known as:  COZAAR Take 1 tablet (100 mg total) by mouth daily.   naproxen 250 MG tablet Commonly known as:  NAPROSYN Take 1 tablet (250 mg total) by mouth 2 (two) times daily with a meal.   potassium chloride SA 20 MEQ tablet Commonly known as:  K-DUR,KLOR-CON Take 1 tablet (20 mEq total) by mouth daily.   predniSONE 5 MG tablet Commonly known as:  DELTASONE Take 1 tablet (5  mg total) by mouth daily with breakfast.   spironolactone 25 MG tablet Commonly known as:  ALDACTONE Take 25 mg by mouth daily.        Review of Systems  Respiratory: Positive for cough, shortness of breath and wheezing.        Objective:   Physical Exam  Constitutional: He is oriented to person, place, and time. He appears well-developed and well-nourished.  Cardiovascular: Normal rate, regular rhythm and normal heart sounds.  Pulmonary/Chest: He has decreased breath sounds. He has wheezes. He has rhonchi.  Neurological: He is alert and oriented to person, place, and time.  Vitals reviewed.   BP 140/82   Pulse 93   Temp 98.1 F (36.7 C)   Wt 280 lb 12.8 oz (127.4 kg)   BMI  41.47 kg/m        Assessment & Plan:   COPD: Order Chest Xray. Rx cough syrup.  Continue treatment plan as outlined by previous provider.  FU in 1 week to review labs and CXR.   Labs ordered CMET, CBC and A1c.

## 2017-03-28 LAB — CBC WITH DIFFERENTIAL/PLATELET
Basophils Absolute: 0 10*3/uL (ref 0.0–0.2)
Basos: 0 %
EOS (ABSOLUTE): 0.2 10*3/uL (ref 0.0–0.4)
Eos: 2 %
Hematocrit: 35.8 % — ABNORMAL LOW (ref 37.5–51.0)
Hemoglobin: 11.9 g/dL — ABNORMAL LOW (ref 13.0–17.7)
IMMATURE GRANULOCYTES: 0 %
Immature Grans (Abs): 0 10*3/uL (ref 0.0–0.1)
LYMPHS ABS: 1.7 10*3/uL (ref 0.7–3.1)
Lymphs: 15 %
MCH: 27.8 pg (ref 26.6–33.0)
MCHC: 33.2 g/dL (ref 31.5–35.7)
MCV: 84 fL (ref 79–97)
MONOS ABS: 0.7 10*3/uL (ref 0.1–0.9)
Monocytes: 7 %
NEUTROS PCT: 76 %
Neutrophils Absolute: 8.2 10*3/uL — ABNORMAL HIGH (ref 1.4–7.0)
PLATELETS: 296 10*3/uL (ref 150–379)
RBC: 4.28 x10E6/uL (ref 4.14–5.80)
RDW: 14.8 % (ref 12.3–15.4)
WBC: 10.9 10*3/uL — AB (ref 3.4–10.8)

## 2017-03-28 LAB — COMPREHENSIVE METABOLIC PANEL
ALT: 16 IU/L (ref 0–44)
AST: 15 IU/L (ref 0–40)
Albumin/Globulin Ratio: 1.3 (ref 1.2–2.2)
Albumin: 4.5 g/dL (ref 3.5–5.5)
Alkaline Phosphatase: 88 IU/L (ref 39–117)
BUN/Creatinine Ratio: 13 (ref 9–20)
BUN: 18 mg/dL (ref 6–24)
CHLORIDE: 100 mmol/L (ref 96–106)
CO2: 23 mmol/L (ref 20–29)
Calcium: 9.6 mg/dL (ref 8.7–10.2)
Creatinine, Ser: 1.4 mg/dL — ABNORMAL HIGH (ref 0.76–1.27)
GFR calc Af Amer: 67 mL/min/{1.73_m2} (ref >59)
GFR calc non Af Amer: 58 mL/min/{1.73_m2} — ABNORMAL LOW (ref >59)
GLUCOSE: 183 mg/dL — AB (ref 65–99)
Globulin, Total: 3.5 g/dL (ref 1.5–4.5)
POTASSIUM: 4.2 mmol/L (ref 3.5–5.2)
Sodium: 139 mmol/L (ref 134–144)
TOTAL PROTEIN: 8 g/dL (ref 6.0–8.5)

## 2017-03-28 LAB — HEMOGLOBIN A1C
ESTIMATED AVERAGE GLUCOSE: 240 mg/dL
HEMOGLOBIN A1C: 10 % — AB (ref 4.8–5.6)

## 2017-04-03 ENCOUNTER — Ambulatory Visit: Payer: Self-pay | Admitting: Adult Health Nurse Practitioner

## 2017-04-03 ENCOUNTER — Encounter: Payer: Self-pay | Admitting: Adult Health Nurse Practitioner

## 2017-04-03 VITALS — BP 117/79 | HR 96 | Temp 97.7°F | Wt 281.1 lb

## 2017-04-03 DIAGNOSIS — J449 Chronic obstructive pulmonary disease, unspecified: Secondary | ICD-10-CM

## 2017-04-03 DIAGNOSIS — E118 Type 2 diabetes mellitus with unspecified complications: Secondary | ICD-10-CM

## 2017-04-03 MED ORDER — FLUTICASONE-SALMETEROL 250-50 MCG/DOSE IN AEPB: 1.0000 | INHALATION_SPRAY | Freq: Two times a day (BID) | RESPIRATORY_TRACT | 3 refills | Status: DC

## 2017-04-03 MED ORDER — GLIPIZIDE 10 MG PO TABS: 10.0000 mg | ORAL_TABLET | Freq: Every day | ORAL | 3 refills | Status: DC

## 2017-04-03 NOTE — Progress Notes (Signed)
Subjective:    Patient ID: Bradley Mckenzie., male    DOB: [birth year], 52 y.o.   MRN: [id number]  HPI  Bradley Mckenzie is a 52 yo male here for f/u of COPD exacerbation and review of flexeril.  COPD exacerbation: He reports he has improved but cough is still present w/ some disorientation. He reports he has not been taking Robitussin as often as RX. He has finished his antibiotics.  Diabetes: pt reports he forgets to take his Glipizide at night.  Muscular cramps: pt reports flexeril 10mg  has not improved symptoms.   Patient Active Problem List   Diagnosis Date Noted  . Type 2 diabetes mellitus with complication ([place]) 78/58/8502  . Stomach upset 09/19/2016  . Somatic dysfunction of spine, lumbar 08/28/2016  . Segmental dysfunction of cervical region 08/28/2016  . Muscle spasm of back 08/28/2016  . Gynecomastia 12/22/2015  . Dental caries 12/12/2015  . Medication monitoring encounter 12/12/2015  . Low back pain 12/12/2015  . Chronic kidney disease, stage II (mild)   . OSA on CPAP   . Uncontrolled hypertension 07/08/2015  . Cardiomyopathy ([place])   . Hyperlipidemia   . Morbid obesity ([place])   . Diabetes mellitus without complication ([place])   . Hypertensive heart disease   . Sinusitis 11/11/2014  . Tachycardia 09/12/2014  . COPD (chronic obstructive pulmonary disease) ([place]) 08/05/2014  . Chronic systolic heart failure ([place]) 06/16/2014  . Obstructive sleep apnea 06/16/2014  . Essential hypertension 06/16/2014  . H/O respiratory system disease 02/24/2014   Allergies as of 04/03/2017   No Known Allergies     Medication List        Accurate as of 04/03/17  7:39 PM. Always use your most recent med list.          aspirin EC 81 MG tablet Take 1 tablet (81 mg total) by mouth daily.   atorvastatin 10 MG tablet Commonly known as:  LIPITOR Take 1 tablet (10 mg total) daily by mouth.   carvedilol 12.5 MG tablet Commonly known as:  COREG Take 12.5 mg by mouth 2 (two) times daily  with a meal.   furosemide 40 MG tablet Commonly known as:  LASIX TAKE ONE TABLET BY MOUTH 2 TIMES A DAY   glipiZIDE 5 MG tablet Commonly known as:  GLUCOTROL Take 1 tablet (5 mg total) by mouth 2 (two) times daily before a meal.   guaiFENesin 100 MG/5ML Soln Commonly known as:  ROBITUSSIN Take 5 mLs (100 mg total) by mouth every 4 (four) hours as needed for cough or to loosen phlegm.   hydrALAZINE 25 MG tablet Commonly known as:  APRESOLINE TAKE ONE TABLET BY MOUTH 2 TIMES A DAY   ipratropium-albuterol 0.5-2.5 (3) MG/3ML Soln Commonly known as:  DUONEB Take 3 mLs by nebulization every 4 (four) hours as needed.   levofloxacin 500 MG tablet Commonly known as:  LEVAQUIN Take 1 tablet (500 mg total) by mouth daily.   losartan 100 MG tablet Commonly known as:  COZAAR Take 1 tablet (100 mg total) by mouth daily.   naproxen 250 MG tablet Commonly known as:  NAPROSYN Take 1 tablet (250 mg total) by mouth 2 (two) times daily with a meal.   potassium chloride SA 20 MEQ tablet Commonly known as:  K-DUR,KLOR-CON Take 1 tablet (20 mEq total) by mouth daily.   predniSONE 5 MG tablet Commonly known as:  DELTASONE Take 1 tablet (5 mg total) by mouth daily with breakfast.   spironolactone 25 MG  tablet Commonly known as:  ALDACTONE Take 25 mg by mouth daily.        Review of Systems Labs reviewed and A1c has increased to 10.     Objective:   Physical Exam  Constitutional: He is oriented to person, place, and time. He appears well-developed and well-nourished.  Cardiovascular: Normal rate, regular rhythm and normal heart sounds.  Pulmonary/Chest: Effort normal. He has decreased breath sounds.  Abdominal: Soft. Bowel sounds are normal.  Neurological: He is alert and oriented to person, place, and time.  Vitals reviewed.   BP 117/79   Pulse 96   Temp 97.7 F (36.5 C) (Oral)   Wt 281 lb 1.6 oz (127.5 kg)   BMI 41.51 kg/m      Assessment & Plan:   Diabetes: Not well  controlled. Increased Glipizide to 10mg  QD. Continue to monitor for need of insulin. Pt declines insulin therapy at this time, states he will continue strict lifestyle changes.   COPD: keep chest xray referral. Rx Advair. Use neb PRN.   Encouraged non-pharmacological interventions for cramps or a 1/2 flexeril as needed.   F/u in 1 mo w/ labs for review of imaging and med maintenance.

## 2017-04-04 ENCOUNTER — Telehealth: Payer: Self-pay

## 2017-04-04 NOTE — Telephone Encounter (Signed)
-----   Message from [name] sent at 04/04/2017  8:55 AM EDT ----- They do not call for XRays patient just walks in any day Monday thru Friday 9 AM to 4 PM at the [facility] or on [employer] [address] [employer] and tell them he has a Chest XRay ordered.  They will take it from there.  I will get [name] to call him. ----- Message ----- From: Staci Acosta, NP Sent: 04/03/2017   7:38 PM To: [name]  I ordered a CXR for Bradley Mckenzie and he said they did not call him? Can you arrange for him to get this xray please. Or just let me know what I need to do. thanks

## 2017-04-04 NOTE — Telephone Encounter (Signed)
Attempted to leave pt a message with instructions on going to get xray. Message cut off and I when I called back no vm picked up. I will attempt to call pt back later this morning.

## 2017-04-08 ENCOUNTER — Telehealth: Payer: Self-pay | Admitting: Pharmacist

## 2017-04-08 NOTE — Telephone Encounter (Signed)
04/08/2017 11:59:14 AM - Advair 250/50  04/08/17 Received pharmacy printout for new med-Advair 250/50 Inhale 1 puff into the lungs 2 times a day, printed script-taking to [facility] for provider to sign. Mailing patient his portion to sign & return with income/support, [id number] & ReCertification packet.Delos Haring

## 2017-04-11 ENCOUNTER — Telehealth: Payer: Self-pay

## 2017-04-11 NOTE — Telephone Encounter (Signed)
Called pt again re xray. I was able to leave instruction on vm. Asked him to call back with any questions

## 2017-04-11 NOTE — Telephone Encounter (Signed)
-----   Message from [name] sent at 04/04/2017  8:55 AM EDT ----- They do not call for XRays patient just walks in any day Monday thru Friday 9 AM to 4 PM at the [facility] or on [employer] and tell them he has a Chest XRay ordered.  They will take it from there.  I will get [name] to call him. ----- Message ----- From: Staci Acosta, NP Sent: 04/03/2017   7:38 PM To: [name]  I ordered a CXR for [name] and he said they did not call him? Can you arrange for him to get this xray please. Or just let me know what I need to do. thanks

## 2017-04-17 ENCOUNTER — Ambulatory Visit: Payer: Self-pay | Admitting: Ophthalmology

## 2017-04-23 NOTE — Progress Notes (Signed)
Patient ID: Bradley Mckenzie., male    DOB: [birth year], 52 y.o.   MRN: [id number]  HPI  BradleyMckenzie is a 52 y/o male who has a history of diabetes, HTN, obstructive sleep apnea, depression, hyperlipidemia, morbid obesity and heart failure with a reduced ejection fraction. Remote tobacco exposure.   Last echo was done 09/02/15 with an EF of 30-35%, mild mitral regurgitation and no aortic stenosis. EF has declined from 40-45% in March 2017 but he had been inconsistent taking his medications & not using his CPAP during this time.  Was in the ED 01/22/17 due to COPD exacerbation where he was treated and released.   Returns today for a follow-up visit with a chief complaint of minimal shortness of breath upon moderate exertion. He describes this as chronic in nature having been present for several years. He has associated fatigue, cough and chronic difficulty sleeping along with this. He denies any abdominal distention, palpitations, edema, chest pain, dizziness or weight gain. He says that his shortness of breath seems to be getting better.   Past Medical History:  Diagnosis Date  . Cardiomyopathy ([place])    a. 01/2014 Echo: EF 25-30%;  b. ? ischemic vs non-ischemic.  He's never had an ischemic eval.  . Chronic combined systolic (congestive) and diastolic (congestive) heart failure ([place])    a. 01/2014 Echo: EF 25-30%. b. Echo 03/2015: Improved EF of 40-45%  . Depression   . Diabetes mellitus without complication ([place])    a. Not previously on outpt meds.  . Hyperlipidemia   . Hypertensive heart disease    a. Since his 53's.  . Morbid obesity ([place])   . Obstructive sleep apnea    a. Has not used CPAP since ~ 2012.   Past Surgical History:  Procedure Laterality Date  . HERNIA REPAIR  2004   Family History  Problem Relation Age of Onset  . Heart disease Mother        alive & well.  [name] Heart attack Father 53       died @ 16 of cancer.  . Diabetes Father   . Cancer Father        "in his stomach"   . Cancer Sister   . Cancer Paternal Uncle    Social History   Tobacco Use  . Smoking status: Former Research scientist (life sciences)  . Smokeless tobacco: Never Used  . Tobacco comment: Quit in his 7's.  Substance Use Topics  . Alcohol use: Yes    Alcohol/week: 0.0 oz    Comment: occassional alcohol use at special events   Prior to Admission medications   Medication Sig Start Date End Date Taking? Authorizing Provider  aspirin EC 81 MG tablet Take 1 tablet (81 mg total) by mouth daily. 07/09/16  Yes Darylene Price A, FNP  atorvastatin (LIPITOR) 10 MG tablet Take 1 tablet (10 mg total) daily by mouth. 11/28/16  Yes Doles-Johnson, Teah, NP  carvedilol (COREG) 12.5 MG tablet Take 12.5 mg by mouth 2 (two) times daily with a meal.   Yes [provider]  Fluticasone-Salmeterol (ADVAIR) 250-50 MCG/DOSE AEPB Inhale 1 puff into the lungs 2 (two) times daily. 04/03/17  Yes Doles-Johnson, Teah, NP  furosemide (LASIX) 40 MG tablet TAKE ONE TABLET BY MOUTH 2 TIMES A DAY 11/29/16  Yes Einer Meals A, FNP  glipiZIDE (GLUCOTROL) 10 MG tablet Take 1 tablet (10 mg total) by mouth daily before breakfast. 04/03/17  Yes Doles-Johnson, Teah, NP  guaiFENesin (ROBITUSSIN) 100 MG/5ML SOLN Take 5 mLs (  100 mg total) by mouth every 4 (four) hours as needed for cough or to loosen phlegm. 03/27/17  Yes Doles-Johnson, Teah, NP  hydrALAZINE (APRESOLINE) 25 MG tablet TAKE ONE TABLET BY MOUTH 2 TIMES A DAY 11/29/16  Yes Willodean Leven A, FNP  ipratropium-albuterol (DUONEB) 0.5-2.5 (3) MG/3ML SOLN Take 3 mLs by nebulization every 4 (four) hours as needed. 03/20/17 06/18/17 Yes Mikael Spray, NP  losartan (COZAAR) 100 MG tablet Take 1 tablet (100 mg total) by mouth daily. 03/05/17 06/03/17 Yes Alisa Graff, FNP  naproxen (NAPROSYN) 250 MG tablet Take 1 tablet (250 mg total) by mouth 2 (two) times daily with a meal. 12/26/16  Yes Azzie Glatter, FNP  spironolactone (ALDACTONE) 25 MG tablet Take 25 mg by mouth daily.   Yes [provider]  potassium chloride SA (K-DUR,KLOR-CON) 20 MEQ tablet Take 1 tablet (20 mEq total) by mouth daily. 12/04/16 03/20/17  Alisa Graff, FNP    Review of Systems  Constitutional: Positive for fatigue. Negative for appetite change.  HENT: Positive for congestion. Negative for postnasal drip.   Eyes: Negative.   Respiratory: Positive for cough ("better"), shortness of breath ("better") and wheezing. Negative for chest tightness.   Cardiovascular: Negative for chest pain, palpitations and leg swelling.  Gastrointestinal: Negative for abdominal distention, abdominal pain and nausea.  Endocrine: Negative.   Genitourinary: Negative.   Musculoskeletal: Negative for back pain and neck pain.  Skin: Negative.   Allergic/Immunologic: Negative.   Neurological: Negative for dizziness and light-headedness.  Hematological: Negative for adenopathy. Does not bruise/bleed easily.  Psychiatric/Behavioral: Positive for sleep disturbance (due to cough). Negative for dysphoric mood and suicidal ideas. The patient is not nervous/anxious.    Vitals:   04/24/17 1112  BP: 133/65  Pulse: 91  Resp: 18  SpO2: 95%  Weight: 284 lb 8 oz (129 kg)  Height: 5\' 9"  (1.753 m)   Wt Readings from Last 3 Encounters:  04/24/17 284 lb 8 oz (129 kg)  04/03/17 281 lb 1.6 oz (127.5 kg)  03/27/17 280 lb 12.8 oz (127.4 kg)   Lab Results  Component Value Date   CREATININE 1.40 (H) 03/27/2017   CREATININE 1.38 (H) 01/22/2017   CREATININE 1.49 (H) 12/19/2016    Physical Exam  Constitutional: He is oriented to person, place, and time. He appears well-developed and well-nourished.  HENT:  Head: Normocephalic and atraumatic.  Neck: Normal range of motion. Neck supple. No JVD present.  Cardiovascular: Normal rate and regular rhythm.  Pulmonary/Chest: Effort normal. He has no wheezes. He has no rales.  Abdominal: Soft. He exhibits no distension. There is no tenderness.  Musculoskeletal: He exhibits no edema or  tenderness.  Neurological: He is alert and oriented to person, place, and time.  Skin: Skin is warm and dry.  Psychiatric: He has a normal mood and affect. His behavior is normal.  Nursing note and vitals reviewed.   Assessment & Plan:  1: Chronic heart failure with reduced ejection fraction-  - NYHA Class II - euvolemic today - weighing daily and he was reminded to call for an overnight weight gain of >2 pounds or a weekly weight gain of >5 pounds - weight up 3 pounds from 04/03/17 - trying to eat low sodium foods when he can but says it's difficult at times due to finances - drinking 64 ounces of water daily - ReDS vest reading not done due to BMI  2: HTN- - BP looks good today - went to [facility] on 04/03/17 & returns 05/06/17 - BMP on 03/27/17 reviewed and shows sodium 139, potassium 4.2 and GFR 67  3: Obstructive sleep apnea- - wearing CPAP nightly; has it tightened so tight that it's not leaking  4: Diabetes- - nonfasting glucose in clinic today was 215 - A1c on 10/17/16 was 8.3%  5: COPD-  - has been using albuterol and nebulizer as needed with some relief at times - using nebulizer 2-3 times daily - coughing is better  Patient did not bring his medications nor a list. Each medication was verbally reviewed with the patient and he was encouraged to bring the bottles to every visit to confirm accuracy of list.  Return in 6 months or sooner for any questions/problems before then.

## 2017-04-24 ENCOUNTER — Ambulatory Visit: Payer: Self-pay | Attending: Family | Admitting: Family

## 2017-04-24 ENCOUNTER — Encounter: Payer: Self-pay | Admitting: Family

## 2017-04-24 VITALS — BP 133/65 | HR 91 | Resp 18 | Ht 69.0 in | Wt 284.5 lb

## 2017-04-24 DIAGNOSIS — Z6841 Body Mass Index (BMI) 40.0 and over, adult: Secondary | ICD-10-CM | POA: Insufficient documentation

## 2017-04-24 DIAGNOSIS — G4733 Obstructive sleep apnea (adult) (pediatric): Secondary | ICD-10-CM | POA: Insufficient documentation

## 2017-04-24 DIAGNOSIS — E785 Hyperlipidemia, unspecified: Secondary | ICD-10-CM | POA: Insufficient documentation

## 2017-04-24 DIAGNOSIS — Z87891 Personal history of nicotine dependence: Secondary | ICD-10-CM | POA: Insufficient documentation

## 2017-04-24 DIAGNOSIS — J449 Chronic obstructive pulmonary disease, unspecified: Secondary | ICD-10-CM | POA: Insufficient documentation

## 2017-04-24 DIAGNOSIS — I110 Hypertensive heart disease with heart failure: Secondary | ICD-10-CM | POA: Insufficient documentation

## 2017-04-24 DIAGNOSIS — E118 Type 2 diabetes mellitus with unspecified complications: Secondary | ICD-10-CM

## 2017-04-24 DIAGNOSIS — I429 Cardiomyopathy, unspecified: Secondary | ICD-10-CM | POA: Insufficient documentation

## 2017-04-24 DIAGNOSIS — Z7951 Long term (current) use of inhaled steroids: Secondary | ICD-10-CM | POA: Insufficient documentation

## 2017-04-24 DIAGNOSIS — Z7984 Long term (current) use of oral hypoglycemic drugs: Secondary | ICD-10-CM | POA: Insufficient documentation

## 2017-04-24 DIAGNOSIS — I10 Essential (primary) hypertension: Secondary | ICD-10-CM

## 2017-04-24 DIAGNOSIS — Z79899 Other long term (current) drug therapy: Secondary | ICD-10-CM | POA: Insufficient documentation

## 2017-04-24 DIAGNOSIS — Z8249 Family history of ischemic heart disease and other diseases of the circulatory system: Secondary | ICD-10-CM | POA: Insufficient documentation

## 2017-04-24 DIAGNOSIS — Z7982 Long term (current) use of aspirin: Secondary | ICD-10-CM | POA: Insufficient documentation

## 2017-04-24 DIAGNOSIS — Z833 Family history of diabetes mellitus: Secondary | ICD-10-CM | POA: Insufficient documentation

## 2017-04-24 DIAGNOSIS — I5022 Chronic systolic (congestive) heart failure: Secondary | ICD-10-CM | POA: Insufficient documentation

## 2017-04-24 DIAGNOSIS — E119 Type 2 diabetes mellitus without complications: Secondary | ICD-10-CM | POA: Insufficient documentation

## 2017-04-24 LAB — GLUCOSE, CAPILLARY: GLUCOSE-CAPILLARY: 215 mg/dL — AB (ref 65–99)

## 2017-04-24 NOTE — Patient Instructions (Signed)
Continue weighing daily and call for an overnight weight gain of > 2 pounds or a weekly weight gain of >5 pounds. 

## 2017-04-25 ENCOUNTER — Telehealth: Payer: Self-pay | Admitting: Pharmacist

## 2017-04-25 NOTE — Telephone Encounter (Signed)
04/25/2017 11:59:18 AM - Ventolin Direction change script to [place]  04/25/17 Faxed script to [facility] for Ventolin HFA 57mcg Inhale 2 puffs into the lungs every 4 hours as needed for wheezing or shortness of breath-This is a change in Directions.Delos Haring

## 2017-04-30 ENCOUNTER — Ambulatory Visit
Admission: RE | Admit: 2017-04-30 | Discharge: 2017-04-30 | Disposition: A | Discharge status: Home / Self Care | Payer: Self-pay | Source: Ambulatory Visit | Attending: Adult Health Nurse Practitioner | Admitting: Adult Health Nurse Practitioner

## 2017-04-30 DIAGNOSIS — J449 Chronic obstructive pulmonary disease, unspecified: Secondary | ICD-10-CM | POA: Insufficient documentation

## 2017-05-01 ENCOUNTER — Ambulatory Visit: Payer: Self-pay | Admitting: Family

## 2017-05-06 ENCOUNTER — Ambulatory Visit: Payer: Self-pay | Admitting: Adult Health Nurse Practitioner

## 2017-05-06 VITALS — BP 135/83 | Wt 280.4 lb

## 2017-05-06 DIAGNOSIS — E119 Type 2 diabetes mellitus without complications: Secondary | ICD-10-CM

## 2017-05-06 MED ORDER — CETIRIZINE HCL 10 MG PO TABS: 10.0000 mg | ORAL_TABLET | Freq: Every day | ORAL | 3 refills | Status: DC

## 2017-05-06 NOTE — Progress Notes (Signed)
  Patient: Bradley Mckenzie. Male    DOB: 26-Apr-1965   52 y.o.   MRN: [id number] Visit Date: 05/06/2017  Today's Provider: Staci Acosta, NP   Chief Complaint  Patient presents with  . Follow-up   Subjective:    HPI   Pt states that he is not having any "black out" spells and that his breathing is improved.  CXR was negative.  States that he is not using the advair daily- he is using it as needed.   States that he has not had to use the nebulizer in a while.   Pollen is bothering him at this time.   No Known Allergies Previous Medications   ASPIRIN EC 81 MG TABLET    Take 1 tablet (81 mg total) by mouth daily.   ATORVASTATIN (LIPITOR) 10 MG TABLET    Take 1 tablet (10 mg total) daily by mouth.   CARVEDILOL (COREG) 12.5 MG TABLET    Take 12.5 mg by mouth 2 (two) times daily with a meal.   FLUTICASONE-SALMETEROL (ADVAIR) 250-50 MCG/DOSE AEPB    Inhale 1 puff into the lungs 2 (two) times daily.   FUROSEMIDE (LASIX) 40 MG TABLET    TAKE ONE TABLET BY MOUTH 2 TIMES A DAY   GLIPIZIDE (GLUCOTROL) 10 MG TABLET    Take 1 tablet (10 mg total) by mouth daily before breakfast.   GUAIFENESIN (ROBITUSSIN) 100 MG/5ML SOLN    Take 5 mLs (100 mg total) by mouth every 4 (four) hours as needed for cough or to loosen phlegm.   HYDRALAZINE (APRESOLINE) 25 MG TABLET    TAKE ONE TABLET BY MOUTH 2 TIMES A DAY   IPRATROPIUM-ALBUTEROL (DUONEB) 0.5-2.5 (3) MG/3ML SOLN    Take 3 mLs by nebulization every 4 (four) hours as needed.   LOSARTAN (COZAAR) 100 MG TABLET    Take 1 tablet (100 mg total) by mouth daily.   NAPROXEN (NAPROSYN) 250 MG TABLET    Take 1 tablet (250 mg total) by mouth 2 (two) times daily with a meal.   POTASSIUM CHLORIDE SA (K-DUR,KLOR-CON) 20 MEQ TABLET    Take 1 tablet (20 mEq total) by mouth daily.   SPIRONOLACTONE (ALDACTONE) 25 MG TABLET    Take 25 mg by mouth daily.    Review of Systems  All other systems reviewed and are negative.   Social History   Tobacco Use  . Smoking  status: Former Research scientist (life sciences)  . Smokeless tobacco: Never Used  . Tobacco comment: Quit in his 75's.  Substance Use Topics  . Alcohol use: Yes    Alcohol/week: 0.0 oz    Comment: occassional alcohol use at special events   Objective:   BP 135/83   Wt 280 lb 6.4 oz (127.2 kg)   BMI 41.41 kg/m   Physical Exam  Constitutional: He appears well-developed and well-nourished.  Cardiovascular: Normal rate, regular rhythm and normal heart sounds.  Pulmonary/Chest: Effort normal and breath sounds normal.  Abdominal: Soft. Bowel sounds are normal.        Assessment & Plan:     COPD:  Instructed to use Advair daily.  Use nebulizer PRN.  Monitor for exacerbations.  Zyrtec for seasonal allergies.    FU in 3 months for routine care- labs a week prior.         Staci Acosta, NP   [facility] [place]

## 2017-05-07 ENCOUNTER — Telehealth: Payer: Self-pay | Admitting: Pharmacy Technician

## 2017-05-07 NOTE — Telephone Encounter (Signed)
Received updated proof of income.  Patient eligible to receive medication assistance at [facility] through 2019, as long as eligibility requirements continue to be met.  [facility]

## 2017-05-08 ENCOUNTER — Other Ambulatory Visit: Payer: Self-pay | Admitting: Family

## 2017-05-14 ENCOUNTER — Telehealth: Payer: Self-pay | Admitting: Pharmacist

## 2017-05-14 NOTE — Telephone Encounter (Signed)
05/14/2017 11:25:22 AM - Advair  09/16/99 Faxed [employer] application to enroll patient for Advair 250/50 Inhale 1 puff into the lungs two times a day.Delos Haring

## 2017-05-26 ENCOUNTER — Other Ambulatory Visit: Payer: Self-pay

## 2017-05-26 ENCOUNTER — Other Ambulatory Visit: Payer: Self-pay | Admitting: Family

## 2017-05-26 DIAGNOSIS — I5022 Chronic systolic (congestive) heart failure: Secondary | ICD-10-CM

## 2017-05-26 MED ORDER — ASPIRIN EC 81 MG PO TBEC: 81.0000 mg | DELAYED_RELEASE_TABLET | Freq: Every day | ORAL | 3 refills | Status: DC

## 2017-05-26 MED ORDER — SPIRONOLACTONE 25 MG PO TABS: 25.0000 mg | ORAL_TABLET | Freq: Every day | ORAL | 3 refills | Status: DC

## 2017-05-26 MED ORDER — ATORVASTATIN CALCIUM 10 MG PO TABS: 10.0000 mg | ORAL_TABLET | Freq: Every day | ORAL | 5 refills | Status: DC

## 2017-07-08 ENCOUNTER — Ambulatory Visit: Payer: Self-pay | Admitting: Adult Health Nurse Practitioner

## 2017-07-08 ENCOUNTER — Encounter: Payer: Self-pay | Admitting: Adult Health Nurse Practitioner

## 2017-07-08 VITALS — BP 117/74 | HR 90 | Temp 100.6°F | Ht 69.0 in | Wt 287.5 lb

## 2017-07-08 DIAGNOSIS — N529 Male erectile dysfunction, unspecified: Secondary | ICD-10-CM

## 2017-07-08 NOTE — Progress Notes (Signed)
Subjective:    Patient ID: Bradley Pitts., male    DOB: 1965-07-02, 52 y.o.   MRN: [id number]  HPI  Constantino Anterrio Mccleery is a 52 yo M here for complaints of ED and he would like meds. He reports he has had symptoms since being on BP meds. He has difficulty getting and keeping an erection.  He presents with a temperature of 100.6. He denies chills or sweats or feeling sick. He endorses frequent urination. He endorses a previous prostate exam.   Patient Active Problem List   Diagnosis Date Noted  . Type 2 diabetes mellitus with complication ([place]) 16/01/930  . Stomach upset 09/19/2016  . Somatic dysfunction of spine, lumbar 08/28/2016  . Segmental dysfunction of cervical region 08/28/2016  . Muscle spasm of back 08/28/2016  . Gynecomastia 12/22/2015  . Dental caries 12/12/2015  . Medication monitoring encounter 12/12/2015  . Low back pain 12/12/2015  . Chronic kidney disease, stage II (mild)   . OSA on CPAP   . Uncontrolled hypertension 07/08/2015  . Cardiomyopathy ([place])   . Hyperlipidemia   . Morbid obesity ([place])   . Diabetes mellitus without complication ([place])   . Hypertensive heart disease   . Sinusitis 11/11/2014  . Tachycardia 09/12/2014  . COPD (chronic obstructive pulmonary disease) ([place]) 08/05/2014  . Chronic systolic heart failure ([place]) 06/16/2014  . Obstructive sleep apnea 06/16/2014  . Essential hypertension 06/16/2014  . H/O respiratory system disease 02/24/2014   Allergies as of 07/08/2017   No Known Allergies     Medication List        Accurate as of 07/08/17  6:27 PM. Always use your most recent med list.          aspirin EC 81 MG tablet Take 1 tablet (81 mg total) by mouth daily.   atorvastatin 10 MG tablet Commonly known as:  LIPITOR Take 1 tablet (10 mg total) by mouth daily.   carvedilol 12.5 MG tablet Commonly known as:  COREG Take 12.5 mg by mouth 2 (two) times daily with a meal.   cetirizine 10 MG tablet Commonly known as:  ZYRTEC  ALLERGY Take 1 tablet (10 mg total) by mouth daily.   Fluticasone-Salmeterol 250-50 MCG/DOSE Aepb Commonly known as:  ADVAIR Inhale 1 puff into the lungs 2 (two) times daily.   furosemide 40 MG tablet Commonly known as:  LASIX TAKE ONE TABLET BY MOUTH 2 TIMES A DAY   glipiZIDE 10 MG tablet Commonly known as:  GLUCOTROL Take 1 tablet (10 mg total) by mouth daily before breakfast.   guaiFENesin 100 MG/5ML Soln Commonly known as:  ROBITUSSIN Take 5 mLs (100 mg total) by mouth every 4 (four) hours as needed for cough or to loosen phlegm.   hydrALAZINE 25 MG tablet Commonly known as:  APRESOLINE TAKE ONE TABLET BY MOUTH 2 TIMES A DAY   ipratropium-albuterol 0.5-2.5 (3) MG/3ML Soln Commonly known as:  DUONEB Take 3 mLs by nebulization every 4 (four) hours as needed.   losartan 100 MG tablet Commonly known as:  COZAAR Take 1 tablet (100 mg total) by mouth daily.   naproxen 250 MG tablet Commonly known as:  NAPROSYN Take 1 tablet (250 mg total) by mouth 2 (two) times daily with a meal.   potassium chloride SA 20 MEQ tablet Commonly known as:  K-DUR,KLOR-CON Take 1 tablet (20 mEq total) by mouth daily.   spironolactone 25 MG tablet Commonly known as:  ALDACTONE Take 1 tablet (25 mg total) by mouth  daily.        Review of Systems  All other systems reviewed and are negative.      Objective:   Physical Exam  Constitutional: He is oriented to person, place, and time. He appears well-developed and well-nourished.  Cardiovascular: Normal rate, regular rhythm and normal heart sounds.  Pulmonary/Chest: Effort normal and breath sounds normal.  Abdominal: Soft. Bowel sounds are normal.  Neurological: He is alert and oriented to person, place, and time.  Vitals reviewed.   BP 117/74   Pulse 90   Temp (!) 100.6 F (38.1 C)   Ht 5\' 9"  (1.753 m)   Wt 287 lb 8 oz (130.4 kg)   BMI 42.46 kg/m        Assessment & Plan:   ED: Rx paper script Viagra 100mg , 3 tablets.  Told pt to take 1/2 tablet prior to sexual activity.   Lab: PSA   Keep 7/23 3 mo f/u for routine care.

## 2017-07-29 ENCOUNTER — Other Ambulatory Visit: Payer: Self-pay

## 2017-08-05 ENCOUNTER — Ambulatory Visit: Payer: Self-pay | Admitting: Urology

## 2017-08-05 VITALS — BP 138/76 | HR 94 | Temp 98.3°F | Ht 69.25 in | Wt 292.7 lb

## 2017-08-05 DIAGNOSIS — E119 Type 2 diabetes mellitus without complications: Secondary | ICD-10-CM

## 2017-08-05 DIAGNOSIS — I5022 Chronic systolic (congestive) heart failure: Secondary | ICD-10-CM

## 2017-08-05 MED ORDER — SILDENAFIL CITRATE 20 MG PO TABS: ORAL_TABLET | ORAL | 3 refills | Status: DC

## 2017-08-05 MED ORDER — IPRATROPIUM-ALBUTEROL 0.5-2.5 (3) MG/3ML IN SOLN: 3.0000 mL | RESPIRATORY_TRACT | 5 refills | Status: DC | PRN

## 2017-08-05 MED ORDER — POTASSIUM CHLORIDE CRYS ER 20 MEQ PO TBCR: 20.0000 meq | EXTENDED_RELEASE_TABLET | Freq: Every day | ORAL | 0 refills | Status: DC

## 2017-08-05 MED ORDER — POTASSIUM CHLORIDE CRYS ER 20 MEQ PO TBCR
20.0000 meq | EXTENDED_RELEASE_TABLET | Freq: Two times a day (BID) | ORAL | 3 refills | Status: DC
Start: 2017-08-05 — End: 2017-10-20

## 2017-08-05 MED ORDER — LOSARTAN POTASSIUM 100 MG PO TABS: 100.0000 mg | ORAL_TABLET | Freq: Every day | ORAL | 0 refills | Status: DC

## 2017-08-05 NOTE — Progress Notes (Signed)
Patient: Bradley Mckenzie. Male    DOB: [birth year]   52 y.o.   MRN: [id number] Visit Date: 08/05/2017  Today's Provider: [place]   Chief Complaint  Patient presents with  . Follow-up    needs to do blood work   Subjective:    HPI Needs labs drawn   Needs meds refilled  Viagra 100 mg; 1/2 tablet not very effective     No Known Allergies Previous Medications   ASPIRIN EC 81 MG TABLET    Take 1 tablet (81 mg total) by mouth daily.   ATORVASTATIN (LIPITOR) 10 MG TABLET    Take 1 tablet (10 mg total) by mouth daily.   CARVEDILOL (COREG) 12.5 MG TABLET    Take 12.5 mg by mouth 2 (two) times daily with a meal.   CETIRIZINE (ZYRTEC ALLERGY) 10 MG TABLET    Take 1 tablet (10 mg total) by mouth daily.   FLUTICASONE-SALMETEROL (ADVAIR) 250-50 MCG/DOSE AEPB    Inhale 1 puff into the lungs 2 (two) times daily.   FUROSEMIDE (LASIX) 40 MG TABLET    TAKE ONE TABLET BY MOUTH 2 TIMES A DAY   GLIPIZIDE (GLUCOTROL) 10 MG TABLET    Take 1 tablet (10 mg total) by mouth daily before breakfast.   GUAIFENESIN (ROBITUSSIN) 100 MG/5ML SOLN    Take 5 mLs (100 mg total) by mouth every 4 (four) hours as needed for cough or to loosen phlegm.   HYDRALAZINE (APRESOLINE) 25 MG TABLET    TAKE ONE TABLET BY MOUTH 2 TIMES A DAY   IPRATROPIUM-ALBUTEROL (DUONEB) 0.5-2.5 (3) MG/3ML SOLN    Take 3 mLs by nebulization every 4 (four) hours as needed.   LOSARTAN (COZAAR) 100 MG TABLET    Take 1 tablet (100 mg total) by mouth daily.   NAPROXEN (NAPROSYN) 250 MG TABLET    Take 1 tablet (250 mg total) by mouth 2 (two) times daily with a meal.   POTASSIUM CHLORIDE SA (K-DUR,KLOR-CON) 20 MEQ TABLET    Take 1 tablet (20 mEq total) by mouth daily.   SPIRONOLACTONE (ALDACTONE) 25 MG TABLET    Take 1 tablet (25 mg total) by mouth daily.    Review of Systems  All other systems reviewed and are negative.   Social History   Tobacco Use  . Smoking status: Former Research scientist (life sciences)  . Smokeless tobacco: Never Used  .  Tobacco comment: Quit in his 48's.  Substance Use Topics  . Alcohol use: Yes    Alcohol/week: 0.0 oz    Comment: occassional alcohol use at special events   Objective:   BP 138/76 (BP Location: Left Arm, Patient Position: Sitting)   Pulse 94   Temp 98.3 F (36.8 C) (Oral)   Ht 5' 9.25" (1.759 m)   Wt 292 lb 11.2 oz (132.8 kg)   BMI 42.91 kg/m   Physical Exam Constitutional: Well nourished. Alert and oriented, No acute distress. HEENT: [place] AT, moist mucus membranes. Trachea midline, no masses. Cardiovascular: No clubbing, cyanosis, or edema. Respiratory: Normal respiratory effort, no increased work of breathing. Skin: No rashes, bruises or suspicious lesions. Lymph: No cervical or inguinal adenopathy. Neurologic: Grossly intact, no focal deficits, moving all 4 extremities. Psychiatric: Normal mood and affect.      Assessment & Plan:     1. ED  Somewhat effective  Sildenafil 20 mg, 3 to 5 tablets two hours prior to intercourse on an empty stomach, # 50; he is warned not to take medications that contain  nitrates.  I also advised him of the side effects, such as: headache, flushing, dyspepsia, abnormal vision, nasal congestion, back pain, myalgia, nausea, dizziness, and rash.  Follow up as schedule in July         [facility]

## 2017-08-06 ENCOUNTER — Other Ambulatory Visit: Payer: Self-pay | Admitting: Urology

## 2017-08-06 LAB — COMPREHENSIVE METABOLIC PANEL
ALT: 22 IU/L (ref 0–44)
AST: 15 IU/L (ref 0–40)
Albumin/Globulin Ratio: 1.4 (ref 1.2–2.2)
Albumin: 4.4 g/dL (ref 3.5–5.5)
Alkaline Phosphatase: 112 IU/L (ref 39–117)
BUN/Creatinine Ratio: 10 (ref 9–20)
BUN: 13 mg/dL (ref 6–24)
CHLORIDE: 99 mmol/L (ref 96–106)
CO2: 22 mmol/L (ref 20–29)
Calcium: 10 mg/dL (ref 8.7–10.2)
Creatinine, Ser: 1.25 mg/dL (ref 0.76–1.27)
GFR calc Af Amer: 77 mL/min/{1.73_m2} (ref >59)
GFR calc non Af Amer: 67 mL/min/{1.73_m2} (ref >59)
GLOBULIN, TOTAL: 3.2 g/dL (ref 1.5–4.5)
Glucose: 199 mg/dL — ABNORMAL HIGH (ref 65–99)
Potassium: 4.1 mmol/L (ref 3.5–5.2)
Sodium: 138 mmol/L (ref 134–144)
Total Protein: 7.6 g/dL (ref 6.0–8.5)

## 2017-08-06 LAB — TSH: TSH: 1.22 u[IU]/mL (ref 0.450–4.500)

## 2017-08-06 LAB — CBC WITH DIFFERENTIAL/PLATELET
BASOS ABS: 0.1 10*3/uL (ref 0.0–0.2)
Basos: 1 %
EOS (ABSOLUTE): 0.3 10*3/uL (ref 0.0–0.4)
EOS: 3 %
HEMATOCRIT: 38.2 % (ref 37.5–51.0)
HEMOGLOBIN: 12.4 g/dL — AB (ref 13.0–17.7)
IMMATURE GRANULOCYTES: 0 %
Immature Grans (Abs): 0 10*3/uL (ref 0.0–0.1)
LYMPHS: 27 %
Lymphocytes Absolute: 2.5 10*3/uL (ref 0.7–3.1)
MCH: 26.7 pg (ref 26.6–33.0)
MCHC: 32.5 g/dL (ref 31.5–35.7)
MCV: 82 fL (ref 79–97)
MONOCYTES: 8 %
Monocytes Absolute: 0.8 10*3/uL (ref 0.1–0.9)
NEUTROS PCT: 61 %
Neutrophils Absolute: 5.7 10*3/uL (ref 1.4–7.0)
Platelets: 296 10*3/uL (ref 150–450)
RBC: 4.64 x10E6/uL (ref 4.14–5.80)
RDW: 13.8 % (ref 12.3–15.4)
WBC: 9.3 10*3/uL (ref 3.4–10.8)

## 2017-08-06 LAB — LIPID PANEL
CHOLESTEROL TOTAL: 165 mg/dL (ref 100–199)
Chol/HDL Ratio: 3.8 ratio (ref 0.0–5.0)
HDL: 44 mg/dL (ref >39)
LDL Calculated: 86 mg/dL (ref 0–99)
TRIGLYCERIDES: 175 mg/dL — AB (ref 0–149)
VLDL Cholesterol Cal: 35 mg/dL (ref 5–40)

## 2017-08-06 LAB — HEMOGLOBIN A1C
Est. average glucose Bld gHb Est-mCnc: 286 mg/dL
Hgb A1c MFr Bld: 11.6 % — ABNORMAL HIGH (ref 4.8–5.6)

## 2017-08-06 LAB — PSA: PROSTATE SPECIFIC AG, SERUM: 1.1 ng/mL (ref 0.0–4.0)

## 2017-08-06 MED ORDER — TADALAFIL 20 MG PO TABS: 20.0000 mg | ORAL_TABLET | Freq: Every day | ORAL | 0 refills | Status: DC | PRN

## 2017-08-06 NOTE — Progress Notes (Signed)
Script send for Cialis.

## 2017-08-12 ENCOUNTER — Telehealth: Payer: Self-pay

## 2017-08-12 NOTE — Telephone Encounter (Signed)
-----   Message from Nori Riis, PA-C sent at 08/07/2017  7:39 AM EDT ----- Please let Mr. Omdahl know that his diabetes is still not under control.  Has he been on metformin?  If not, we need to start metformin 1000 mg bid and recheck his HbgA1c and CMP in three months.

## 2017-08-12 NOTE — Telephone Encounter (Signed)
Spoke with pt re lab results. States he was on metformin but was taken off because of loose stools and accidents. Sent message to [place] re: this.

## 2017-08-14 ENCOUNTER — Ambulatory Visit: Payer: Self-pay | Admitting: Adult Health Nurse Practitioner

## 2017-08-14 VITALS — BP 139/83 | HR 85 | Temp 98.2°F | Ht 69.5 in | Wt 287.5 lb

## 2017-08-14 DIAGNOSIS — E119 Type 2 diabetes mellitus without complications: Secondary | ICD-10-CM

## 2017-08-14 MED ORDER — INSULIN GLARGINE 100 UNIT/ML ~~LOC~~ SOLN: 10.0000 [IU] | Freq: Every day | SUBCUTANEOUS | 11 refills | Status: DC

## 2017-08-14 NOTE — Progress Notes (Signed)
Patient: Bradley Mckenzie. Male    DOB: 15-Sep-1965   52 y.o.   MRN: [id number] Visit Date: 08/14/2017  Today's Provider: Staci Acosta, NP   Chief Complaint  Patient presents with  . Follow-up   Subjective:    HPI   A1c is 11.6 up from 10 4 months ago.  Does not want to take metformin due to SE profile.   States that he is getting a spontaneous erection while sleeping but continues to be unable to maintain an erection during intercourse.  States that the Viagra 1/2 tab isnt working.  Hasnt picked up Sildenafil that was rx' d last by [facility].   No Known Allergies Previous Medications   ASPIRIN EC 81 MG TABLET    Take 1 tablet (81 mg total) by mouth daily.   ATORVASTATIN (LIPITOR) 10 MG TABLET    Take 1 tablet (10 mg total) by mouth daily.   CARVEDILOL (COREG) 12.5 MG TABLET    Take 12.5 mg by mouth 2 (two) times daily with a meal.   CETIRIZINE (ZYRTEC ALLERGY) 10 MG TABLET    Take 1 tablet (10 mg total) by mouth daily.   FLUTICASONE-SALMETEROL (ADVAIR) 250-50 MCG/DOSE AEPB    Inhale 1 puff into the lungs 2 (two) times daily.   FUROSEMIDE (LASIX) 40 MG TABLET    TAKE ONE TABLET BY MOUTH 2 TIMES A DAY   GLIPIZIDE (GLUCOTROL) 10 MG TABLET    Take 1 tablet (10 mg total) by mouth daily before breakfast.   GUAIFENESIN (ROBITUSSIN) 100 MG/5ML SOLN    Take 5 mLs (100 mg total) by mouth every 4 (four) hours as needed for cough or to loosen phlegm.   HYDRALAZINE (APRESOLINE) 25 MG TABLET    TAKE ONE TABLET BY MOUTH 2 TIMES A DAY   IPRATROPIUM-ALBUTEROL (DUONEB) 0.5-2.5 (3) MG/3ML SOLN    Take 3 mLs by nebulization every 4 (four) hours as needed.   LOSARTAN (COZAAR) 100 MG TABLET    Take 1 tablet (100 mg total) by mouth daily.   NAPROXEN (NAPROSYN) 250 MG TABLET    Take 1 tablet (250 mg total) by mouth 2 (two) times daily with a meal.   POTASSIUM CHLORIDE SA (K-DUR,KLOR-CON) 20 MEQ TABLET    Take 1 tablet (20 mEq total) by mouth 2 (two) times daily.   POTASSIUM CHLORIDE SA (K-DUR,KLOR-CON)  20 MEQ TABLET    Take 1 tablet (20 mEq total) by mouth daily.   SILDENAFIL (REVATIO) 20 MG TABLET    Take 3 to 5 tablets two hours before intercouse on an empty stomach.  Do not take with nitrates.   SPIRONOLACTONE (ALDACTONE) 25 MG TABLET    Take 1 tablet (25 mg total) by mouth daily.   TADALAFIL (CIALIS) 20 MG TABLET    Take 1 tablet (20 mg total) by mouth daily as needed for erectile dysfunction.    Review of Systems  Social History   Tobacco Use  . Smoking status: Former Research scientist (life sciences)  . Smokeless tobacco: Never Used  . Tobacco comment: Quit in his 72's.  Substance Use Topics  . Alcohol use: Yes    Alcohol/week: 0.0 oz    Comment: occassional alcohol use at special events   Objective:   BP 139/83 (BP Location: Left Arm, Patient Position: Sitting)   Pulse 85   Temp 98.2 F (36.8 C) (Oral)   Ht 5' 9.5" (1.765 m)   Wt 287 lb 8 oz (130.4 kg)   BMI 41.85 kg/m   Physical Exam  Constitutional: He appears well-developed and well-nourished.  Cardiovascular: Normal rate and regular rhythm.  Pulmonary/Chest: Effort normal and breath sounds normal.  Abdominal: Soft. Bowel sounds are normal.  Skin: Skin is warm and dry.  Bilateral feet-skin is intact, no open areas. Sensation is intact.         Assessment & Plan:         DM:  Not controlled.  Encourage strict diabetic diet and exercise.  Continue current medication regimen and add Lantus 10 units daily.  Check CBGs daily before breakfast.  Check feet daily.    Call medication management for the Cialis.       Staci Acosta, NP   [facility]

## 2017-08-15 ENCOUNTER — Telehealth: Payer: Self-pay | Admitting: Pharmacist

## 2017-08-15 NOTE — Telephone Encounter (Signed)
08/15/2017 4:09:42 PM - Advair refill  08/15/17 Place refill online with [facility] for Advair 250/50 to release 08/28/17 order# [id number].Delos Haring

## 2017-08-19 ENCOUNTER — Telehealth: Payer: Self-pay | Admitting: Pharmacist

## 2017-08-19 NOTE — Telephone Encounter (Signed)
08/19/2017 10:51:32 AM - Lantus Solostar New med  08/19/17 Received pharmacy printout for new med-Lantus Solostar Inject 10 unit under the skin every day, mailing patient his portion to sign & return, also sending provider portion to [facility] for Teah to sign & fill in Diagnosis code.Delos Haring

## 2017-09-05 ENCOUNTER — Telehealth: Payer: Self-pay | Admitting: Pharmacist

## 2017-09-05 NOTE — Telephone Encounter (Signed)
09/05/2017 11:35:51 AM - Cialis  08/02/89 Faxed [employer] application for Cialis 20mg  Take one tablet by mouth every day as needed for Erectile Dysfunction. [employer] will only ship 30 pills every 6 months-this information was provided to provider when paperwork sent for her to sign.Delos Haring

## 2017-09-23 ENCOUNTER — Telehealth: Payer: Self-pay | Admitting: Pharmacist

## 2017-09-23 NOTE — Telephone Encounter (Signed)
09/23/2017 1:53:19 PM - Lantus Solostar  09/23/17 Faxed [employer] application for Lantus Solostar Inject 10 units every day.Delos Haring

## 2017-10-17 NOTE — Progress Notes (Signed)
Patient ID: Bradley Mckenzie., male    DOB: 1965-06-01, 52 y.o.   MRN: [id number]  HPI  Bradley Mckenzie is a 52 y/o male who has a history of diabetes, HTN, obstructive sleep apnea, depression, hyperlipidemia, morbid obesity and heart failure with a reduced ejection fraction. Remote tobacco exposure.   Last echo was done 09/02/15 with an EF of 30-35%, mild mitral regurgitation and no aortic stenosis. EF has declined from 40-45% in March 2017 but he had been inconsistent taking his medications & not using his CPAP during this time.  Has not been admitted or been in the ED in the last 6 months.   Returns today for a follow-up visit with a chief complaint of minimal shortness of breath upon moderate exertion. He describes this as chronic in nature having been present for several years. He has associated fatigue, cough, wheezing and head congestion along with this. He denies any difficulty sleeping, abdominal distention, palpitations, pedal edema, chest pain, dizziness or weight gain. Continues to have difficulty with his CPAP equipment. Is unsure of exactly what medication he takes as he takes them all out of the bottles and puts them in pill boxes and then throws the medication bottle away.   Past Medical History:  Diagnosis Date  . Cardiomyopathy (Bradley Mckenzie)    a. 01/2014 Echo: EF 25-30%;  b. ? ischemic vs non-ischemic.  He's never had an ischemic eval.  . Chronic combined systolic (congestive) and diastolic (congestive) heart failure (Bradley Mckenzie)    a. 01/2014 Echo: EF 25-30%. b. Echo 03/2015: Improved EF of 40-45%  . Depression   . Diabetes mellitus without complication (Bradley Mckenzie)    a. Not previously on outpt meds.  . Hyperlipidemia   . Hypertensive heart disease    a. Since his 92's.  . Morbid obesity (Bradley Mckenzie)   . Obstructive sleep apnea    a. Has not used CPAP since ~ 2012.   Past Surgical History:  Procedure Laterality Date  . HERNIA REPAIR  2004   Family History  Problem Relation Age of Onset  . Heart  disease Mother        alive & well.  [name] Heart attack Father 45       died @ 22 of cancer.  . Diabetes Father   . Cancer Father        "in his stomach"  . Cancer Sister   . Cancer Paternal Uncle    Social History   Tobacco Use  . Smoking status: Former Research scientist (life sciences)  . Smokeless tobacco: Never Used  . Tobacco comment: Quit in his 26's.  Substance Use Topics  . Alcohol use: Yes    Alcohol/week: 0.0 standard drinks    Comment: occassional alcohol use at special events   Prior to Admission medications   Medication Sig Start Date End Date Taking? Authorizing Provider  aspirin EC 81 MG tablet Take 1 tablet (81 mg total) by mouth daily. 05/26/17  Yes Graeme Menees, Otila Kluver A, FNP  carvedilol (COREG) 12.5 MG tablet Take 12.5 mg by mouth 2 (two) times daily with a meal.   Yes [provider]  cetirizine (ZYRTEC ALLERGY) 10 MG tablet Take 1 tablet (10 mg total) by mouth daily. 05/06/17  Yes Doles-Johnson, Teah, NP  Fluticasone-Salmeterol (ADVAIR) 250-50 MCG/DOSE AEPB Inhale 1 puff into the lungs 2 (two) times daily. 04/03/17  Yes Doles-Johnson, Teah, NP  furosemide (LASIX) 40 MG tablet TAKE ONE TABLET BY MOUTH 2 TIMES A DAY 11/29/16  Yes Alisa Graff, FNP  hydrALAZINE (  APRESOLINE) 25 MG tablet TAKE ONE TABLET BY MOUTH 2 TIMES A DAY 11/29/16  Yes Darylene Price A, FNP  insulin glargine (LANTUS) 100 UNIT/ML injection Inject 0.1 mLs (10 Units total) into the skin daily. 08/14/17  Yes Doles-Johnson, Teah, NP  ipratropium-albuterol (DUONEB) 0.5-2.5 (3) MG/3ML SOLN Take 3 mLs by nebulization every 4 (four) hours as needed. 08/05/17 11/03/17 Yes McGowan, Larene Beach A, PA-C  losartan (COZAAR) 100 MG tablet Take 1 tablet (100 mg total) by mouth daily. 08/05/17 11/03/17 Yes McGowan, Larene Beach A, PA-C  potassium chloride SA (K-DUR,KLOR-CON) 20 MEQ tablet Take 1 tablet (20 mEq total) by mouth daily. 08/05/17 11/03/17 Yes McGowan, Larene Beach A, PA-C  spironolactone (ALDACTONE) 25 MG tablet Take 1 tablet (25 mg total) by mouth  daily. 05/26/17  Yes Darylene Price A, FNP  tadalafil (CIALIS) 20 MG tablet Take 1 tablet (20 mg total) by mouth daily as needed for erectile dysfunction. 08/06/17  Yes McGowan, Larene Beach A, PA-C  atorvastatin (LIPITOR) 10 MG tablet Take 1 tablet (10 mg total) by mouth daily. 05/26/17   Alisa Graff, FNP  glipiZIDE (GLUCOTROL) 10 MG tablet TAKE ONE TABLET BY MOUTH EVERY DAY BEFORE BREAKFAST. 10/20/17   Doles-Johnson, Teah, NP    Review of Systems  Constitutional: Positive for fatigue. Negative for appetite change.  HENT: Positive for congestion and postnasal drip. Negative for sore throat.   Eyes: Negative.   Respiratory: Positive for cough, shortness of breath ("better") and wheezing. Negative for chest tightness.   Cardiovascular: Negative for chest pain, palpitations and leg swelling.  Gastrointestinal: Negative for abdominal distention, abdominal pain and nausea.  Endocrine: Negative.   Genitourinary: Negative.   Musculoskeletal: Negative for back pain and neck pain.  Skin: Negative.   Allergic/Immunologic: Negative.   Neurological: Negative for dizziness and light-headedness.  Hematological: Negative for adenopathy. Does not bruise/bleed easily.  Psychiatric/Behavioral: Negative for dysphoric mood, sleep disturbance and suicidal ideas. The patient is not nervous/anxious.    Vitals:   10/20/17 1109  BP: 140/80  Pulse: (!) 103  Resp: 18  SpO2: 97%  Weight: 286 lb 4 oz (129.8 kg)  Height: 5\' 9"  (1.753 m)   Wt Readings from Last 3 Encounters:  10/20/17 286 lb 4 oz (129.8 kg)  08/14/17 287 lb 8 oz (130.4 kg)  08/05/17 292 lb 11.2 oz (132.8 kg)   Lab Results  Component Value Date   CREATININE 1.25 08/05/2017   CREATININE 1.40 (H) 03/27/2017   CREATININE 1.38 (H) 01/22/2017    Physical Exam  Constitutional: He is oriented to person, place, and time. He appears well-developed and well-nourished.  HENT:  Head: Normocephalic and atraumatic.  Neck: Normal range of motion. Neck  supple. No JVD present.  Cardiovascular: Normal rate and regular rhythm.  Pulmonary/Chest: Effort normal. He has wheezes (few in bilateral lower lobes). He has no rales.  Abdominal: Soft. He exhibits no distension. There is no tenderness.  Musculoskeletal: He exhibits no edema or tenderness.  Neurological: He is alert and oriented to person, place, and time.  Skin: Skin is warm and dry.  Psychiatric: He has a normal mood and affect. His behavior is normal.  Nursing note and vitals reviewed.   Assessment & Plan:  1: Chronic heart failure with reduced ejection fraction-  - NYHA Class II - euvolemic today - weighing daily and he was reminded to call for an overnight weight gain of >2 pounds or a weekly weight gain of >5 pounds - weight up 2 pounds since last here 6 months  ago - trying to eat low sodium foods when he can but says it's difficult at times due to finances - drinking 64 ounces of water daily - ReDS vest reading not done due to BMI - last echo was done 2017 so will message [facility] to see if they can order one for him - should his EF remain low, would like to try him on entresto; was on it once before but due to cost he couldn't take it but [facility]  2: HTN- - BP looks good today - went to [facility] on 08/14/17 - BMP on 08/05/17 reviewed and shows sodium 138, potassium 4.1, creatinine 1.25 and GFR 77  3: Obstructive sleep apnea- - wearing CPAP nightly; has it tightened so tight that it's not leaking but it still doesn't feel like it's fitting correctly  4: Diabetes- - says that he hasn't checked his glucose lately - A1c on 08/05/17 was 11.6%  Patient did not bring his medications nor a list. Each medication was verbally reviewed with the patient and he was encouraged to bring the bottles to every visit to confirm accuracy of list.  Return in 6 months or sooner for any questions/problems before then.

## 2017-10-20 ENCOUNTER — Other Ambulatory Visit: Payer: Self-pay | Admitting: Adult Health Nurse Practitioner

## 2017-10-20 ENCOUNTER — Encounter: Payer: Self-pay | Admitting: Family

## 2017-10-20 ENCOUNTER — Ambulatory Visit: Payer: Self-pay | Attending: Family | Admitting: Family

## 2017-10-20 VITALS — BP 140/80 | HR 103 | Resp 18 | Ht 69.0 in | Wt 286.2 lb

## 2017-10-20 DIAGNOSIS — F329 Major depressive disorder, single episode, unspecified: Secondary | ICD-10-CM | POA: Insufficient documentation

## 2017-10-20 DIAGNOSIS — Z8249 Family history of ischemic heart disease and other diseases of the circulatory system: Secondary | ICD-10-CM | POA: Insufficient documentation

## 2017-10-20 DIAGNOSIS — I5042 Chronic combined systolic (congestive) and diastolic (congestive) heart failure: Secondary | ICD-10-CM | POA: Insufficient documentation

## 2017-10-20 DIAGNOSIS — Z7982 Long term (current) use of aspirin: Secondary | ICD-10-CM | POA: Insufficient documentation

## 2017-10-20 DIAGNOSIS — E119 Type 2 diabetes mellitus without complications: Secondary | ICD-10-CM | POA: Insufficient documentation

## 2017-10-20 DIAGNOSIS — E785 Hyperlipidemia, unspecified: Secondary | ICD-10-CM | POA: Insufficient documentation

## 2017-10-20 DIAGNOSIS — Z9989 Dependence on other enabling machines and devices: Secondary | ICD-10-CM

## 2017-10-20 DIAGNOSIS — I10 Essential (primary) hypertension: Secondary | ICD-10-CM

## 2017-10-20 DIAGNOSIS — Z794 Long term (current) use of insulin: Secondary | ICD-10-CM | POA: Insufficient documentation

## 2017-10-20 DIAGNOSIS — I5022 Chronic systolic (congestive) heart failure: Secondary | ICD-10-CM

## 2017-10-20 DIAGNOSIS — I110 Hypertensive heart disease with heart failure: Secondary | ICD-10-CM | POA: Insufficient documentation

## 2017-10-20 DIAGNOSIS — R0602 Shortness of breath: Secondary | ICD-10-CM | POA: Insufficient documentation

## 2017-10-20 DIAGNOSIS — I429 Cardiomyopathy, unspecified: Secondary | ICD-10-CM | POA: Insufficient documentation

## 2017-10-20 DIAGNOSIS — Z87891 Personal history of nicotine dependence: Secondary | ICD-10-CM | POA: Insufficient documentation

## 2017-10-20 DIAGNOSIS — G4733 Obstructive sleep apnea (adult) (pediatric): Secondary | ICD-10-CM | POA: Insufficient documentation

## 2017-10-20 DIAGNOSIS — Z79899 Other long term (current) drug therapy: Secondary | ICD-10-CM | POA: Insufficient documentation

## 2017-10-20 NOTE — Patient Instructions (Signed)
Continue weighing daily and call for an overnight weight gain of > 2 pounds or a weekly weight gain of >5 pounds. 

## 2017-10-21 ENCOUNTER — Telehealth: Payer: Self-pay | Admitting: Adult Health Nurse Practitioner

## 2017-10-21 NOTE — Telephone Encounter (Signed)
Called patient and rescheduled 10/10 eye appointment to 10/24.

## 2017-10-23 ENCOUNTER — Other Ambulatory Visit: Payer: Self-pay | Admitting: Adult Health Nurse Practitioner

## 2017-10-23 ENCOUNTER — Ambulatory Visit: Payer: Self-pay | Admitting: Ophthalmology

## 2017-10-23 DIAGNOSIS — I5022 Chronic systolic (congestive) heart failure: Secondary | ICD-10-CM

## 2017-10-24 ENCOUNTER — Telehealth: Payer: Self-pay

## 2017-10-24 NOTE — Telephone Encounter (Signed)
-----   Message from Staci Acosta, NP sent at 10/23/2017  5:22 PM EDT ----- Farris Has,   Can you call Mr. Chavarria and inform him that he will need a charity care application in order to receive his ECHO. I already put the order in.   thanks ----- Message ----- From: Alisa Graff, FNP Sent: 10/20/2017   3:14 PM EDT To: Staci Acosta, NP  I saw this mutual patient in the [facility] today and was wondering if you could order him an echocardiogram? His last one was done in 2017.   We could order it for him but then he would get a bill so I didn't know if he could get it ordered through you at no cost.   Also, he says that he's having difficulty with his CPAP equipment and I didn't know if you had a contact with someone about getting it serviced. We contacted [facility] but they told him that since they didn't get the equipment for him, they couldn't service it.   Thanks for your help in taking care of Bradley Mckenzie, [place], [place] [facility] [phone]

## 2017-10-24 NOTE — Telephone Encounter (Signed)
Spoke with [place] about [facility] application needing to be completed before scheduling imaging.  Left voicemail with information pt will need to provide when he comes in to fill out application.

## 2017-10-28 ENCOUNTER — Ambulatory Visit: Payer: Self-pay | Admitting: Family

## 2017-11-06 ENCOUNTER — Ambulatory Visit: Payer: Self-pay | Admitting: Ophthalmology

## 2017-11-11 ENCOUNTER — Other Ambulatory Visit: Payer: Self-pay

## 2017-11-11 ENCOUNTER — Telehealth: Payer: Self-pay | Admitting: Pharmacist

## 2017-11-11 DIAGNOSIS — E119 Type 2 diabetes mellitus without complications: Secondary | ICD-10-CM

## 2017-11-11 NOTE — Telephone Encounter (Signed)
11/11/2017 2:39:59 PM - Lantus Solostar refill  11/11/17 Printed [employer] refill request for Lantus Solostar Inject 90 units 2 times a day #11, sending to [facility] for provider to sign.Delos Haring

## 2017-11-12 LAB — COMPREHENSIVE METABOLIC PANEL
ALK PHOS: 120 IU/L — AB (ref 39–117)
ALT: 22 IU/L (ref 0–44)
AST: 14 IU/L (ref 0–40)
Albumin/Globulin Ratio: 1.5 (ref 1.2–2.2)
Albumin: 4.4 g/dL (ref 3.5–5.5)
BUN/Creatinine Ratio: 11 (ref 9–20)
BUN: 14 mg/dL (ref 6–24)
Bilirubin Total: <0.2 mg/dL (ref 0.0–1.2)
CALCIUM: 9.7 mg/dL (ref 8.7–10.2)
CO2: 24 mmol/L (ref 20–29)
CREATININE: 1.33 mg/dL — AB (ref 0.76–1.27)
Chloride: 97 mmol/L (ref 96–106)
GFR calc Af Amer: 72 mL/min/{1.73_m2} (ref >59)
GFR calc non Af Amer: 62 mL/min/{1.73_m2} (ref >59)
GLUCOSE: 283 mg/dL — AB (ref 65–99)
Globulin, Total: 3 g/dL (ref 1.5–4.5)
POTASSIUM: 4.4 mmol/L (ref 3.5–5.2)
SODIUM: 138 mmol/L (ref 134–144)
Total Protein: 7.4 g/dL (ref 6.0–8.5)

## 2017-11-12 LAB — LIPID PANEL
CHOL/HDL RATIO: 3.4 ratio (ref 0.0–5.0)
Cholesterol, Total: 138 mg/dL (ref 100–199)
HDL: 41 mg/dL (ref >39)
LDL CALC: 73 mg/dL (ref 0–99)
Triglycerides: 119 mg/dL (ref 0–149)
VLDL CHOLESTEROL CAL: 24 mg/dL (ref 5–40)

## 2017-11-12 LAB — CBC
Hematocrit: 37.5 % (ref 37.5–51.0)
Hemoglobin: 12.2 g/dL — ABNORMAL LOW (ref 13.0–17.7)
MCH: 27.2 pg (ref 26.6–33.0)
MCHC: 32.5 g/dL (ref 31.5–35.7)
MCV: 84 fL (ref 79–97)
PLATELETS: 266 10*3/uL (ref 150–450)
RBC: 4.49 x10E6/uL (ref 4.14–5.80)
RDW: 13.4 % (ref 12.3–15.4)
WBC: 9.4 10*3/uL (ref 3.4–10.8)

## 2017-11-12 LAB — TSH: TSH: 0.977 u[IU]/mL (ref 0.450–4.500)

## 2017-11-12 LAB — HEMOGLOBIN A1C
Est. average glucose Bld gHb Est-mCnc: 295 mg/dL
Hgb A1c MFr Bld: 11.9 % — ABNORMAL HIGH (ref 4.8–5.6)

## 2017-11-18 ENCOUNTER — Ambulatory Visit: Payer: Self-pay | Admitting: Urology

## 2017-11-18 VITALS — BP 136/83 | HR 93 | Temp 98.0°F | Ht 69.5 in | Wt 286.9 lb

## 2017-11-18 DIAGNOSIS — E119 Type 2 diabetes mellitus without complications: Secondary | ICD-10-CM

## 2017-11-18 NOTE — Progress Notes (Signed)
Patient: Bradley Mckenzie. Male    DOB: [birth year]   52 y.o.   MRN: [id number] Visit Date: 11/18/2017  Today's Provider: [place]   Chief Complaint  Patient presents with  . Follow-up   Subjective:    HPI Seen by cardiology and is in need for a repeat ECHO, but he will need charity care prior to scheduling - He still needs to finish filling out the charity care.   He is taking the glipizide 10 mg daily and Lantus 10 U daily - does not want to take metformin due to SE profile - there has been no improvement in his Hgb A1c - he admits that he had just cut back on potatoes - he trying to get some exercise   Still has issues with CPAP machine   No Known Allergies Previous Medications   ASPIRIN EC 81 MG TABLET    Take 1 tablet (81 mg total) by mouth daily.   ATORVASTATIN (LIPITOR) 10 MG TABLET    Take 1 tablet (10 mg total) by mouth daily.   CARVEDILOL (COREG) 12.5 MG TABLET    Take 12.5 mg by mouth 2 (two) times daily with a meal.   CETIRIZINE (ZYRTEC ALLERGY) 10 MG TABLET    Take 1 tablet (10 mg total) by mouth daily.   FLUTICASONE-SALMETEROL (ADVAIR) 250-50 MCG/DOSE AEPB    Inhale 1 puff into the lungs 2 (two) times daily.   FUROSEMIDE (LASIX) 40 MG TABLET    TAKE ONE TABLET BY MOUTH 2 TIMES A DAY   GLIPIZIDE (GLUCOTROL) 10 MG TABLET    TAKE ONE TABLET BY MOUTH EVERY DAY BEFORE BREAKFAST.   HYDRALAZINE (APRESOLINE) 25 MG TABLET    TAKE ONE TABLET BY MOUTH 2 TIMES A DAY   INSULIN GLARGINE (LANTUS) 100 UNIT/ML INJECTION    Inject 0.1 mLs (10 Units total) into the skin daily.   IPRATROPIUM-ALBUTEROL (DUONEB) 0.5-2.5 (3) MG/3ML SOLN    Take 3 mLs by nebulization every 4 (four) hours as needed.   LOSARTAN (COZAAR) 100 MG TABLET    Take 1 tablet (100 mg total) by mouth daily.   POTASSIUM CHLORIDE SA (K-DUR,KLOR-CON) 20 MEQ TABLET    Take 1 tablet (20 mEq total) by mouth daily.   SPIRONOLACTONE (ALDACTONE) 25 MG TABLET    Take 1 tablet (25 mg total) by mouth daily.   TADALAFIL  (CIALIS) 20 MG TABLET    Take 1 tablet (20 mg total) by mouth daily as needed for erectile dysfunction.    Review of Systems  Social History   Tobacco Use  . Smoking status: Former Research scientist (life sciences)  . Smokeless tobacco: Never Used  . Tobacco comment: Quit in his 67's.  Substance Use Topics  . Alcohol use: Yes    Alcohol/week: 0.0 standard drinks    Comment: occassional alcohol use at special events   Objective:   BP 136/83   Pulse 93   Temp 98 F (36.7 C)   Ht 5' 9.5" (1.765 m)   Wt 286 lb 14.4 oz (130.1 kg)   BMI 41.76 kg/m   Physical Exam  Constitutional: He appears well-developed and well-nourished.  HENT:  Head: Normocephalic and atraumatic.  Eyes: EOM are normal.  Neck: Normal range of motion. Neck supple.  Pulmonary/Chest: Effort normal.  Skin: Skin is warm and dry.    Assessment & Plan:    DM:  Not controlled.  Patient not following a strict diet and has low exercise tolerance Encourage strict diabetic diet and exercise.  Continue current medication regimen and add Lantus 10 units daily.  Check CBGs daily before breakfast - not checking sugars Check feet daily Makes a lot of excuses as to why he cannot follow diet and check sugar  Need to see endocrinologist - schedule for ENDO night   [facility]

## 2017-11-26 ENCOUNTER — Other Ambulatory Visit: Payer: Self-pay | Admitting: Family

## 2017-11-26 ENCOUNTER — Other Ambulatory Visit: Payer: Self-pay | Admitting: Adult Health Nurse Practitioner

## 2017-11-27 ENCOUNTER — Telehealth: Payer: Self-pay | Admitting: Pharmacist

## 2017-11-27 NOTE — Telephone Encounter (Signed)
11/27/2017 8:20:10 AM - Lantus Solostar refill  11/27/17 Lantus Solostar Inject 10 units under the skin daily #1, sending refill request to [facility] for provider to sign.Delos Haring

## 2017-12-01 ENCOUNTER — Other Ambulatory Visit: Payer: Self-pay | Admitting: Urology

## 2017-12-04 ENCOUNTER — Telehealth: Payer: Self-pay | Admitting: Pharmacist

## 2017-12-04 NOTE — Telephone Encounter (Signed)
12/04/2017 10:41:57 AM - Lantus Solostar refill  12/04/17 Faxed [employer] refill request for Lantus Solostar Inject 10 units under the skin daily #1.Bradley Mckenzie

## 2017-12-13 ENCOUNTER — Other Ambulatory Visit: Payer: Self-pay

## 2017-12-13 ENCOUNTER — Emergency Department
Admission: EM | Admit: 2017-12-13 | Discharge: 2017-12-13 | Disposition: A | Discharge status: Home / Self Care | Payer: Self-pay | Attending: Emergency Medicine | Admitting: Emergency Medicine

## 2017-12-13 DIAGNOSIS — K029 Dental caries, unspecified: Secondary | ICD-10-CM | POA: Insufficient documentation

## 2017-12-13 DIAGNOSIS — Z79899 Other long term (current) drug therapy: Secondary | ICD-10-CM | POA: Insufficient documentation

## 2017-12-13 DIAGNOSIS — N182 Chronic kidney disease, stage 2 (mild): Secondary | ICD-10-CM | POA: Insufficient documentation

## 2017-12-13 DIAGNOSIS — Z7982 Long term (current) use of aspirin: Secondary | ICD-10-CM | POA: Insufficient documentation

## 2017-12-13 DIAGNOSIS — Z794 Long term (current) use of insulin: Secondary | ICD-10-CM | POA: Insufficient documentation

## 2017-12-13 DIAGNOSIS — Z87891 Personal history of nicotine dependence: Secondary | ICD-10-CM | POA: Insufficient documentation

## 2017-12-13 DIAGNOSIS — I5042 Chronic combined systolic (congestive) and diastolic (congestive) heart failure: Secondary | ICD-10-CM | POA: Insufficient documentation

## 2017-12-13 DIAGNOSIS — I130 Hypertensive heart and chronic kidney disease with heart failure and stage 1 through stage 4 chronic kidney disease, or unspecified chronic kidney disease: Secondary | ICD-10-CM | POA: Insufficient documentation

## 2017-12-13 DIAGNOSIS — K0889 Other specified disorders of teeth and supporting structures: Secondary | ICD-10-CM

## 2017-12-13 DIAGNOSIS — E1122 Type 2 diabetes mellitus with diabetic chronic kidney disease: Secondary | ICD-10-CM | POA: Insufficient documentation

## 2017-12-13 MED ORDER — TRAMADOL HCL 50 MG PO TABS: 50.0000 mg | ORAL_TABLET | Freq: Four times a day (QID) | ORAL | 0 refills | Status: DC | PRN

## 2017-12-13 MED ORDER — TRAMADOL HCL 50 MG PO TABS
50.0000 mg | ORAL_TABLET | Freq: Once | ORAL | Status: AC
  Administered 2017-12-13: 50 mg via ORAL
  Filled 2017-12-13: qty 1

## 2017-12-13 MED ORDER — LIDOCAINE VISCOUS HCL 2 % MT SOLN
15.0000 mL | Freq: Once | OROMUCOSAL | Status: AC
  Administered 2017-12-13: 15 mL via OROMUCOSAL
  Filled 2017-12-13: qty 15

## 2017-12-13 MED ORDER — AMOXICILLIN-POT CLAVULANATE 875-125 MG PO TABS
1.0000 | ORAL_TABLET | Freq: Once | ORAL | Status: AC
  Administered 2017-12-13: 1 via ORAL
  Filled 2017-12-13: qty 1

## 2017-12-13 MED ORDER — AMOXICILLIN-POT CLAVULANATE 875-125 MG PO TABS: 1.0000 | ORAL_TABLET | Freq: Two times a day (BID) | ORAL | 0 refills | Status: AC

## 2017-12-13 NOTE — ED Triage Notes (Signed)
Pt arrives to ED via POV with c/o dental pain x2 days. Pt reports lower right-sided dental pain, no facial swelling observed. Pt states taking Tylenol 1 hr PTA without relief.

## 2017-12-13 NOTE — ED Provider Notes (Signed)
[facility] Emergency Department Provider Note ______________   First MD Initiated Contact with Patient 12/13/17 [phone]     (approximate)  I have reviewed the triage vital signs and the nursing notes.   HISTORY  Chief Complaint Dental Pain   HPI Bradley Mckenzie. is a 52 y.o. male with below list of chronic medical conditions presents to the emergency department with right tubular molar pain x2 days patient states current pain score is 9 out of 10.  Patient states that he took Tylenol 1 hour before arrival without any relief.  Patient denies any fever no difficulty swallowing.   Past Medical History:  Diagnosis Date  . Cardiomyopathy ([place])    a. 01/2014 Echo: EF 25-30%;  b. ? ischemic vs non-ischemic.  He's never had an ischemic eval.  . Chronic combined systolic (congestive) and diastolic (congestive) heart failure ([place])    a. 01/2014 Echo: EF 25-30%. b. Echo 03/2015: Improved EF of 40-45%  . Depression   . Diabetes mellitus without complication ([place])    a. Not previously on outpt meds.  . Hyperlipidemia   . Hypertensive heart disease    a. Since his 24's.  . Morbid obesity ([place])   . Obstructive sleep apnea    a. Has not used CPAP since ~ 2012.    Patient Active Problem List   Diagnosis Date Noted  . Type 2 diabetes mellitus with complication ([place]) 96/04/5407  . Stomach upset 09/19/2016  . Somatic dysfunction of spine, lumbar 08/28/2016  . Segmental dysfunction of cervical region 08/28/2016  . Muscle spasm of back 08/28/2016  . Gynecomastia 12/22/2015  . Dental caries 12/12/2015  . Medication monitoring encounter 12/12/2015  . Low back pain 12/12/2015  . Chronic kidney disease, stage II (mild)   . OSA on CPAP   . Uncontrolled hypertension 07/08/2015  . Cardiomyopathy ([place])   . Hyperlipidemia   . Morbid obesity ([place])   . Diabetes mellitus without complication ([place])   . Hypertensive heart disease   . Sinusitis 11/11/2014  . Tachycardia  09/12/2014  . COPD (chronic obstructive pulmonary disease) ([place]) 08/05/2014  . Chronic systolic heart failure ([place]) 06/16/2014  . Obstructive sleep apnea 06/16/2014  . Essential hypertension 06/16/2014  . H/O respiratory system disease 02/24/2014    Past Surgical History:  Procedure Laterality Date  . HERNIA REPAIR  2004    Prior to Admission medications   Medication Sig Start Date End Date Taking? Authorizing Provider  amoxicillin-clavulanate (AUGMENTIN) 875-125 MG tablet Take 1 tablet by mouth 2 (two) times daily for 10 days. 12/13/17 12/23/17  Gregor Hams, MD  aspirin EC 81 MG tablet Take 1 tablet (81 mg total) by mouth daily. 05/26/17   Alisa Graff, FNP  atorvastatin (LIPITOR) 10 MG tablet Take 1 tablet (10 mg total) by mouth daily. 05/26/17   Alisa Graff, FNP  carvedilol (COREG) 12.5 MG tablet Take 12.5 mg by mouth 2 (two) times daily with a meal.    [provider]  carvedilol (COREG) 12.5 MG tablet TAKE ONE TABLET BY MOUTH 2 TIMES A DAY WITH A MEAL. 11/26/17   Alisa Graff, FNP  cetirizine (ZYRTEC) 10 MG tablet TAKE ONE TABLET BY MOUTH EVERY DAY 11/26/17   Doles-Johnson, Teah, NP  Fluticasone-Salmeterol (ADVAIR) 250-50 MCG/DOSE AEPB Inhale 1 puff into the lungs 2 (two) times daily. 04/03/17   Doles-Johnson, Teah, NP  furosemide (LASIX) 40 MG tablet TAKE ONE TABLET BY MOUTH 2 TIMES A DAY 11/29/16  Hackney, Tina A, FNP  glipiZIDE (GLUCOTROL) 10 MG tablet TAKE ONE TABLET BY MOUTH EVERY DAY BEFORE BREAKFAST. 10/20/17   Doles-Johnson, Teah, NP  hydrALAZINE (APRESOLINE) 25 MG tablet TAKE ONE TABLET BY MOUTH 2 TIMES A DAY 11/29/16   Darylene Price A, FNP  insulin glargine (LANTUS) 100 UNIT/ML injection Inject 0.1 mLs (10 Units total) into the skin daily. 08/14/17   Doles-Johnson, Teah, NP  ipratropium-albuterol (DUONEB) 0.5-2.5 (3) MG/3ML SOLN Take 3 mLs by nebulization every 4 (four) hours as needed. 08/05/17 11/18/17  Zara Council A, PA-C  losartan (COZAAR) 100 MG  tablet Take 1 tablet (100 mg total) by mouth daily. 08/05/17 11/18/17  Zara Council A, PA-C  potassium chloride SA (K-DUR,KLOR-CON) 20 MEQ tablet Take 1 tablet (20 mEq total) by mouth daily. 08/05/17 11/18/17  Zara Council A, PA-C  spironolactone (ALDACTONE) 25 MG tablet Take 1 tablet (25 mg total) by mouth daily. 05/26/17   Alisa Graff, FNP  tadalafil (CIALIS) 20 MG tablet Take 1 tablet (20 mg total) by mouth daily as needed for erectile dysfunction. 08/06/17   Zara Council A, PA-C  traMADol (ULTRAM) 50 MG tablet Take 1 tablet (50 mg total) by mouth every 6 (six) hours as needed. 12/13/17 12/13/18  Gregor Hams, MD    Allergies Patient has no known allergies.  Family History  Problem Relation Age of Onset  . Heart disease Mother        alive & well.  [name] Heart attack Father 26       died @ 29 of cancer.  . Diabetes Father   . Cancer Father        "in his stomach"  . Cancer Sister   . Cancer Paternal Uncle     Social History Social History   Tobacco Use  . Smoking status: Former Research scientist (life sciences)  . Smokeless tobacco: Never Used  . Tobacco comment: Quit in his 57's.  Substance Use Topics  . Alcohol use: Yes    Alcohol/week: 0.0 standard drinks    Comment: occassional alcohol use at special events  . Drug use: No    Comment: Used marijuana in his teens.    Review of Systems Constitutional: No fever/chills Eyes: No visual changes. ENT: No sore throat.  Positive for dental pain Cardiovascular: Denies chest pain. Respiratory: Denies shortness of breath. Gastrointestinal: No abdominal pain.  No nausea, no vomiting.  No diarrhea.  No constipation. Genitourinary: Negative for dysuria. Musculoskeletal: Negative for neck pain.  Negative for back pain. Integumentary: Negative for rash. Neurological: Negative for headaches, focal weakness or numbness.   ____________________________________________   PHYSICAL EXAM:  VITAL SIGNS: ED Triage Vitals  Enc Vitals Group      BP 12/13/17 0417 (!) 157/64     Pulse Rate 12/13/17 0417 81     Resp 12/13/17 0417 18     Temp 12/13/17 0417 98.4 F (36.9 C)     Temp Source 12/13/17 0417 Oral     SpO2 12/13/17 0417 96 %     Weight 12/13/17 0416 127 kg (280 lb)     Height 12/13/17 0416 1.753 m (5\' 9" )     Head Circumference --      Peak Flow --      Pain Score 12/13/17 0416 10     Pain Loc --      Pain Edu? --      Excl. in [place]? --     Constitutional: Alert and oriented. Well appearing and in no acute distress.  Eyes: Conjunctivae are normal.  Mouth/Throat: Mucous membranes are moist.  Oropharynx non-erythematous.  Positive for multiple dental caries Neck: No stridor.  Cardiovascular: Normal rate, regular rhythm. Good peripheral circulation. Grossly normal heart sounds. Respiratory: Normal respiratory effort.  No retractions. Lungs CTAB. Gastrointestinal: Soft and nontender. No distention.  Musculoskeletal: No lower extremity tenderness nor edema. No gross deformities of extremities. Neurologic:  Normal speech and language. No gross focal neurologic deficits are appreciated.  Skin:  Skin is warm, dry and intact. No rash noted.    Procedures   ____________________________________________   INITIAL IMPRESSION / ASSESSMENT AND PLAN / ED COURSE  As part of my medical decision making, I reviewed the following data within the electronic MEDICAL RECORD NUMBER   52 year old male presenting with above-stated history and physical exam secondary to dental pain.  Patient given viscous lidocaine swish and spit and tramadol with improvement of pain patient referred to dentist. ____________________________________________  FINAL CLINICAL IMPRESSION(S) / ED DIAGNOSES  Final diagnoses:  Dental caries  Pain, dental     MEDICATIONS GIVEN DURING THIS VISIT:  Medications  lidocaine (XYLOCAINE) 2 % viscous mouth solution 15 mL (has no administration in time range)  traMADol (ULTRAM) tablet 50 mg (has no administration in  time range)  amoxicillin-clavulanate (AUGMENTIN) 875-125 MG per tablet 1 tablet (has no administration in time range)     ED Discharge Orders         Ordered    amoxicillin-clavulanate (AUGMENTIN) 875-125 MG tablet  2 times daily     12/13/17 0615    traMADol (ULTRAM) 50 MG tablet  Every 6 hours PRN     12/13/17 0615           Note:  This document was prepared using Dragon voice recognition software and may include unintentional dictation errors.    Gregor Hams, MD 12/13/17 [phone]

## 2017-12-23 ENCOUNTER — Telehealth: Payer: Self-pay | Admitting: Adult Health Nurse Practitioner

## 2017-12-23 NOTE — Telephone Encounter (Signed)
Pt came into clinic requesting dental referral. We were unsure of ticket distribution process. Needs to be called when his dental tickets are ready to be picked up.

## 2018-01-15 ENCOUNTER — Telehealth: Payer: Self-pay | Admitting: Pharmacist

## 2018-01-15 NOTE — Telephone Encounter (Signed)
01/15/2018 11:20:15 AM - Advair refill  01/15/18 Placed refill online with [facility] for Advair 250/50, to ship 01/28/18, order# [id number].Delos Haring

## 2018-01-16 ENCOUNTER — Other Ambulatory Visit: Payer: Self-pay

## 2018-01-16 ENCOUNTER — Emergency Department
Admission: EM | Admit: 2018-01-16 | Discharge: 2018-01-16 | Disposition: A | Discharge status: Home / Self Care | Payer: Self-pay | Attending: Emergency Medicine | Admitting: Emergency Medicine

## 2018-01-16 ENCOUNTER — Encounter: Payer: Self-pay | Admitting: Emergency Medicine

## 2018-01-16 DIAGNOSIS — Z7982 Long term (current) use of aspirin: Secondary | ICD-10-CM | POA: Insufficient documentation

## 2018-01-16 DIAGNOSIS — J449 Chronic obstructive pulmonary disease, unspecified: Secondary | ICD-10-CM | POA: Insufficient documentation

## 2018-01-16 DIAGNOSIS — I5042 Chronic combined systolic (congestive) and diastolic (congestive) heart failure: Secondary | ICD-10-CM | POA: Insufficient documentation

## 2018-01-16 DIAGNOSIS — Z794 Long term (current) use of insulin: Secondary | ICD-10-CM | POA: Insufficient documentation

## 2018-01-16 DIAGNOSIS — I110 Hypertensive heart disease with heart failure: Secondary | ICD-10-CM | POA: Insufficient documentation

## 2018-01-16 DIAGNOSIS — K029 Dental caries, unspecified: Secondary | ICD-10-CM | POA: Insufficient documentation

## 2018-01-16 DIAGNOSIS — Z79899 Other long term (current) drug therapy: Secondary | ICD-10-CM | POA: Insufficient documentation

## 2018-01-16 DIAGNOSIS — K0889 Other specified disorders of teeth and supporting structures: Secondary | ICD-10-CM | POA: Insufficient documentation

## 2018-01-16 DIAGNOSIS — Z87891 Personal history of nicotine dependence: Secondary | ICD-10-CM | POA: Insufficient documentation

## 2018-01-16 DIAGNOSIS — E119 Type 2 diabetes mellitus without complications: Secondary | ICD-10-CM | POA: Insufficient documentation

## 2018-01-16 MED ORDER — IBUPROFEN 600 MG PO TABS
600.0000 mg | ORAL_TABLET | Freq: Once | ORAL | Status: AC
  Administered 2018-01-16: 600 mg via ORAL
  Filled 2018-01-16: qty 1

## 2018-01-16 MED ORDER — OXYCODONE-ACETAMINOPHEN 5-325 MG PO TABS: 1.0000 | ORAL_TABLET | ORAL | 0 refills | Status: DC | PRN

## 2018-01-16 MED ORDER — PENICILLIN V POTASSIUM 500 MG PO TABS: 500.0000 mg | ORAL_TABLET | Freq: Four times a day (QID) | ORAL | 0 refills | Status: DC

## 2018-01-16 NOTE — Discharge Instructions (Signed)
Take the penicillin 1 pill 4 times a day to help treat any infection.  That is often the cause of tooth pain like this.  Take the Percocet 1 pill up to 4 times a day if needed for pain.  Careful can make you sleepy and constipated.  Do not drive on it as that will make you an impaired driver.  See if 1 of the cardiologists I gave you will see you and fill out your form.  If not continue to [facility] to get a [facility] referral.  Return here as needed.

## 2018-01-16 NOTE — ED Triage Notes (Signed)
Patient ambulatory to triage with steady gait, without difficulty or distress noted; pt reports right sided dental pain "for awhile"; st has seen a dentist who wants to pull it but won't until he has clearance from cardiologist; pt reports he has none

## 2018-01-16 NOTE — ED Notes (Signed)
Is aware of bp.  Says he has not taken med this am

## 2018-01-16 NOTE — ED Provider Notes (Signed)
[facility] Emergency Department Provider Note   ____________________________________________   First MD Initiated Contact with Patient 01/16/18 0720     (approximate)  I have reviewed the triage vital signs and the nursing notes.   HISTORY  Chief Complaint Dental Pain    HPI Bradley Mckenzie. is a 53 y.o. male who has been having pain in the rear most molar on the right side lower jaw.  He has a Pharmacist, community and the dentist is planning on taking it out but the dentist wants cardiology to clear him first.  Patient does not have a cardiologist he is [facility].  He is coming in today because the pain is getting worse and he cannot sleep anymore.  He is not running a fever.  The pain is made worse with hot and cold but not with exertion.  It is worse to palpation.  This is same pain as he has had in the past with that tooth.   Past Medical History:  Diagnosis Date  . Cardiomyopathy ([place])    a. 01/2014 Echo: EF 25-30%;  b. ? ischemic vs non-ischemic.  He's never had an ischemic eval.  . Chronic combined systolic (congestive) and diastolic (congestive) heart failure ([place])    a. 01/2014 Echo: EF 25-30%. b. Echo 03/2015: Improved EF of 40-45%  . Depression   . Diabetes mellitus without complication ([place])    a. Not previously on outpt meds.  . Hyperlipidemia   . Hypertensive heart disease    a. Since his 37's.  . Morbid obesity ([place])   . Obstructive sleep apnea    a. Has not used CPAP since ~ 2012.    Patient Active Problem List   Diagnosis Date Noted  . Type 2 diabetes mellitus with complication ([place]) 93/81/8299  . Stomach upset 09/19/2016  . Somatic dysfunction of spine, lumbar 08/28/2016  . Segmental dysfunction of cervical region 08/28/2016  . Muscle spasm of back 08/28/2016  . Gynecomastia 12/22/2015  . Dental caries 12/12/2015  . Medication monitoring encounter 12/12/2015  . Low back pain 12/12/2015  . Chronic kidney disease,  stage II (mild)   . OSA on CPAP   . Uncontrolled hypertension 07/08/2015  . Cardiomyopathy ([place])   . Hyperlipidemia   . Morbid obesity ([place])   . Diabetes mellitus without complication ([place])   . Hypertensive heart disease   . Sinusitis 11/11/2014  . Tachycardia 09/12/2014  . COPD (chronic obstructive pulmonary disease) ([place]) 08/05/2014  . Chronic systolic heart failure ([place]) 06/16/2014  . Obstructive sleep apnea 06/16/2014  . Essential hypertension 06/16/2014  . H/O respiratory system disease 02/24/2014    Past Surgical History:  Procedure Laterality Date  . HERNIA REPAIR  2004    Prior to Admission medications   Medication Sig Start Date End Date Taking? Authorizing Provider  aspirin EC 81 MG tablet Take 1 tablet (81 mg total) by mouth daily. 05/26/17   Alisa Graff, FNP  atorvastatin (LIPITOR) 10 MG tablet Take 1 tablet (10 mg total) by mouth daily. 05/26/17   Alisa Graff, FNP  carvedilol (COREG) 12.5 MG tablet Take 12.5 mg by mouth 2 (two) times daily with a meal.    [provider]  carvedilol (COREG) 12.5 MG tablet TAKE ONE TABLET BY MOUTH 2 TIMES A DAY WITH A MEAL. 11/26/17   Alisa Graff, FNP  cetirizine (ZYRTEC) 10 MG tablet TAKE ONE TABLET BY MOUTH EVERY DAY 11/26/17   Doles-Johnson, Leonarda Salon, NP  Fluticasone-Salmeterol (  ADVAIR) 250-50 MCG/DOSE AEPB Inhale 1 puff into the lungs 2 (two) times daily. 04/03/17   Doles-Johnson, Teah, NP  furosemide (LASIX) 40 MG tablet TAKE ONE TABLET BY MOUTH 2 TIMES A DAY 11/29/16   Hackney, Otila Kluver A, FNP  glipiZIDE (GLUCOTROL) 10 MG tablet TAKE ONE TABLET BY MOUTH EVERY DAY BEFORE BREAKFAST. 10/20/17   Doles-Johnson, Teah, NP  hydrALAZINE (APRESOLINE) 25 MG tablet TAKE ONE TABLET BY MOUTH 2 TIMES A DAY 11/29/16   Darylene Price A, FNP  insulin glargine (LANTUS) 100 UNIT/ML injection Inject 0.1 mLs (10 Units total) into the skin daily. 08/14/17   Doles-Johnson, Teah, NP  ipratropium-albuterol (DUONEB) 0.5-2.5 (3) MG/3ML SOLN Take 3 mLs  by nebulization every 4 (four) hours as needed. 08/05/17 11/18/17  Zara Council A, PA-C  losartan (COZAAR) 100 MG tablet Take 1 tablet (100 mg total) by mouth daily. 08/05/17 11/18/17  Zara Council A, PA-C  oxyCODONE-acetaminophen (PERCOCET) 5-325 MG tablet Take 1 tablet by mouth every 4 (four) hours as needed for severe pain. 01/16/18 01/16/19  Nena Polio, MD  penicillin v potassium (VEETID) 500 MG tablet Take 1 tablet (500 mg total) by mouth 4 (four) times daily. 01/16/18   Nena Polio, MD  potassium chloride SA (K-DUR,KLOR-CON) 20 MEQ tablet Take 1 tablet (20 mEq total) by mouth daily. 08/05/17 11/18/17  Zara Council A, PA-C  spironolactone (ALDACTONE) 25 MG tablet Take 1 tablet (25 mg total) by mouth daily. 05/26/17   Alisa Graff, FNP  tadalafil (CIALIS) 20 MG tablet Take 1 tablet (20 mg total) by mouth daily as needed for erectile dysfunction. 08/06/17   Zara Council A, PA-C  traMADol (ULTRAM) 50 MG tablet Take 1 tablet (50 mg total) by mouth every 6 (six) hours as needed. 12/13/17 12/13/18  Gregor Hams, MD    Allergies Patient has no known allergies.  Family History  Problem Relation Age of Onset  . Heart disease Mother        alive & well.  [name] Heart attack Father 19       died @ 84 of cancer.  . Diabetes Father   . Cancer Father        "in his stomach"  . Cancer Sister   . Cancer Paternal Uncle     Social History Social History   Tobacco Use  . Smoking status: Former Research scientist (life sciences)  . Smokeless tobacco: Never Used  . Tobacco comment: Quit in his 4's.  Substance Use Topics  . Alcohol use: Yes    Alcohol/week: 0.0 standard drinks    Comment: occassional alcohol use at special events  . Drug use: No    Comment: Used marijuana in his teens.    Review of Systems  Constitutional: No fever/chills Eyes: No visual changes. ENT: No sore throat. Cardiovascular: Denies chest pain. Respiratory: Denies shortness of breath. Gastrointestinal: No abdominal pain.  No  nausea, no vomiting.  No diarrhea.  No constipation. Genitourinary: Negative for dysuria. Musculoskeletal: Negative for back pain. Skin: Negative for rash. Neurological: Negative for headaches, focal weakness   ____________________________________________   PHYSICAL EXAM:  VITAL SIGNS: ED Triage Vitals [01/16/18 0539]  Enc Vitals Group     BP (!) 146/69     Pulse Rate 84     Resp 18     Temp 98 F (36.7 C)     Temp Source Oral     SpO2 97 %     Weight 280 lb (127 kg)     Height 5'  9" (1.753 m)     Head Circumference      Peak Flow      Pain Score 5     Pain Loc      Pain Edu?      Excl. in [place]?     Constitutional: Alert and oriented. Well appearing and in no acute distress. Eyes: Conjunctivae are normal.  Head: Atraumatic. Nose: No congestion/rhinnorhea. Mouth/Throat: Mucous membranes are moist.  Oropharynx non-erythematous.  Rearmost molar in the left lower jaw has a hole in it the gum is somewhat swollen and red around it.  Tooth itself is not markedly tender to palpation or percussion but the jaw and that area is tender. Neck: No stridor no lymphadenopathy is palpable Cardiovascular: Normal rate, regular rhythm. Grossly normal heart sounds.  Good peripheral circulation. Respiratory: Normal respiratory effort.  No retractions. Lungs CTAB. Gastrointestinal: Soft and nontender. No distention. No abdominal bruits. No CVA tenderness. Neurologic:  Normal speech and language. No gross focal neurologic deficits are appreciated. No gait instability. Skin:  Skin is warm, dry and intact. No rash noted. Psychiatric: Mood and affect are normal. Speech and behavior are normal.  ____________________________________________   LABS (all labs ordered are listed, but only abnormal results are displayed)  Labs Reviewed - No data to display ____________________________________________  EKG   ____________________________________________  RADIOLOGY  ED MD interpretation:    Official radiology report(s): No results found.  ____________________________________________   PROCEDURES  Procedure(s) performed: I reviewed the patient's old records extensively.  The patient does not seem to have a cardiologist appointment since 2016.  Patient has been seeing the heart failure clinic here in the hospital but the nurse practitioner there does not feel comfortable signing the paperwork per patient who has already spoken to her.  Procedures  Critical Care performed:   ____________________________________________   INITIAL IMPRESSION / ASSESSMENT AND PLAN / ED COURSE  When I went to look up patient's in epic there apparently has been a referral to Dr. Tiffany Kocher.  I have given the patient referral to Dr. Donnetta Hail again since his name came up and also to Dr. Henrene Pastor shows since he has been seen at [facility] clinic and also to Dr. Rockey Situ who is on-call for unassigned patients.  I have instructed the patient that he should call the cardiologist and see if they will see him.  If not he needs to continue to work [facility] to get a cardiology referral.      ____________________________________________   FINAL CLINICAL IMPRESSION(S) / ED DIAGNOSES  Final diagnoses:  Pain, dental  Dental caries     ED Discharge Orders         Ordered    penicillin v potassium (VEETID) 500 MG tablet  4 times daily     01/16/18 0743    oxyCODONE-acetaminophen (PERCOCET) 5-325 MG tablet  Every 4 hours PRN     01/16/18 0743           Note:  This document was prepared using Dragon voice recognition software and may include unintentional dictation errors.    Nena Polio, MD 01/16/18 [phone]

## 2018-01-20 ENCOUNTER — Other Ambulatory Visit: Payer: Self-pay | Admitting: Adult Health Nurse Practitioner

## 2018-01-20 ENCOUNTER — Other Ambulatory Visit: Payer: Self-pay | Admitting: Family

## 2018-01-20 ENCOUNTER — Other Ambulatory Visit: Payer: Self-pay | Admitting: Internal Medicine

## 2018-01-20 DIAGNOSIS — I5022 Chronic systolic (congestive) heart failure: Secondary | ICD-10-CM

## 2018-01-22 ENCOUNTER — Other Ambulatory Visit: Payer: Self-pay | Admitting: Urology

## 2018-01-22 MED ORDER — AMOXICILLIN 500 MG PO TABS: ORAL_TABLET | ORAL | 0 refills | Status: DC

## 2018-01-22 NOTE — Progress Notes (Signed)
Prescription in for amoxicillin

## 2018-02-03 ENCOUNTER — Ambulatory Visit: Payer: Self-pay | Admitting: Internal Medicine

## 2018-02-03 VITALS — BP 135/77 | HR 93 | Temp 98.5°F | Ht 69.0 in | Wt 280.8 lb

## 2018-02-03 DIAGNOSIS — E119 Type 2 diabetes mellitus without complications: Secondary | ICD-10-CM

## 2018-02-03 LAB — POCT GLYCOSYLATED HEMOGLOBIN (HGB A1C): Hemoglobin A1C: 10.5 % — AB (ref 4.0–5.6)

## 2018-02-03 LAB — GLUCOSE, POCT (MANUAL RESULT ENTRY): POC Glucose: 236 mg/dl — AB (ref 70–99)

## 2018-02-03 MED ORDER — INSULIN GLARGINE 100 UNIT/ML ~~LOC~~ SOLN: 10.0000 [IU] | Freq: Every day | SUBCUTANEOUS | 11 refills | Status: DC

## 2018-02-03 MED ORDER — METFORMIN HCL ER 500 MG PO TB24: 500.0000 mg | ORAL_TABLET | Freq: Every day | ORAL | 11 refills | Status: DC

## 2018-02-03 NOTE — Progress Notes (Signed)
Follow up Diabetes[facility]     Patient ID: Bradley Mckenzie., male   DOB: [birth year], 53 y.o.   MRN: [id number] Assessment:  Bradley Mckenzie. is a 53 y.o. male who is seen for the first time with [facility] for Diabetes mellitus without complication (Bradley Mckenzie) [L24.4].  Encounter Diagnoses 1. Diabetes mellitus without complication (Bradley Mckenzie)     Assessment    Plan:     Pt previously quit metformin after dosage increase to 850mg  BID due to GI upset. Pt prescribed 500mg  metformin daily, told to increase to 500mg  BID after one week in hopes that GI symptoms will not appear with lower dose. Pt told to increase Lantus by 2 units every 3 days until morning blood sugars are below 150. Glipizide dose will remain the same. Pt told to only check blood sugar in the morning.    There are no Patient Instructions on file for this visit.   No orders of the defined types were placed in this encounter.    Subjective:  HPI   Blood sugars: 150-250s in the evening, does not check in the morning but is willing to change. Meds: Pt currently taking 10mg  glipizide daily in the morning, 10u lantus in the evening. 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  has a past medical history of Cardiomyopathy (Bradley Mckenzie), Chronic combined systolic (congestive) and diastolic (congestive) heart failure (Bradley Mckenzie), Depression, Diabetes mellitus without complication (Bradley Mckenzie), Hyperlipidemia, Hypertensive heart disease, Morbid obesity (Bradley Mckenzie), and Obstructive sleep apnea.  Family History, Social History, current  Medications and allergies reviewed and updated in Epic.  Objective:    Blood pressure 135/77, pulse 93, temperature 98.5 F (36.9 C), height 5\' 9"  (1.753 m), weight 280 lb 12.8 oz (127.4 kg).  Physical Exam   Current Outpatient Medications on File Prior to Visit  Medication Sig Dispense Refill  . amoxicillin (AMOXIL) 500 MG tablet Take four tablets prior to dental procedure 4 tablet 0  . aspirin EC 81 MG tablet Take 1 tablet (81 mg total) by mouth daily. 90 tablet 3  . atorvastatin (LIPITOR) 10 MG tablet TAKE ONE TABLET BY MOUTH EVERY DAY 90 tablet 3  . carvedilol (COREG) 12.5 MG tablet Take 12.5 mg by mouth 2 (two) times daily with a meal.    . carvedilol (COREG) 12.5 MG tablet TAKE ONE TABLET BY MOUTH 2 TIMES A DAY WITH A MEAL. 180 tablet 3  . cetirizine (ZYRTEC) 10 MG tablet TAKE ONE TABLET BY MOUTH EVERY DAY 52 tablet 0  . Fluticasone-Salmeterol (ADVAIR) 250-50 MCG/DOSE AEPB Inhale 1 puff into the lungs 2 (two) times daily. 60 each 3  . furosemide (LASIX) 40 MG tablet TAKE ONE TABLET BY MOUTH 2 TIMES A DAY 180 tablet 3  . glipiZIDE (GLUCOTROL) 10 MG tablet TAKE ONE TABLET BY MOUTH EVERY DAY BEFORE BREAKFAST. 90 tablet 0  . hydrALAZINE (APRESOLINE) 25 MG tablet TAKE ONE TABLET BY MOUTH 2 TIMES A DAY 180 tablet 3  . insulin glargine (LANTUS) 100 UNIT/ML injection Inject 0.1 mLs (10 Units total) into the skin daily. 10 mL 11  . ipratropium-albuterol (DUONEB)  0.5-2.5 (3) MG/3ML SOLN Take 3 mLs by nebulization every 4 (four) hours as needed. 360 mL 5  . losartan (COZAAR) 100 MG tablet Take 1 tablet (100 mg total) by mouth daily. 90 tablet 0  . oxyCODONE-acetaminophen (PERCOCET) 5-325 MG tablet Take 1 tablet by mouth every 4 (four) hours as needed for severe pain. 20 tablet 0  . penicillin v potassium (VEETID) 500 MG tablet Take 1 tablet (500 mg total) by mouth 4 (four) times daily. 40 tablet 0  . potassium chloride SA (K-DUR,KLOR-CON) 20 MEQ tablet TAKE ONE TABLET BY MOUTH EVERY DAY 90  tablet 0  . spironolactone (ALDACTONE) 25 MG tablet Take 1 tablet (25 mg total) by mouth daily. 90 tablet 3  . tadalafil (CIALIS) 20 MG tablet Take 1 tablet (20 mg total) by mouth daily as needed for erectile dysfunction. 30 tablet 0  . traMADol (ULTRAM) 50 MG tablet Take 1 tablet (50 mg total) by mouth every 6 (six) hours as needed. 20 tablet 0   No current facility-administered medications on file prior to visit.      Data : I have personally reviewed pertinent labs and imaging studies, if indicated,  with the patient in clinic today.   Lab Orders  No laboratory test(s) ordered today    HC Readings from Last 3 Encounters:  No data found for [facility]    Wt Readings from Last 3 Encounters:  02/03/18 280 lb 12.8 oz (127.4 kg)  01/16/18 280 lb (127 kg)  12/13/17 280 lb (127 kg)

## 2018-02-17 ENCOUNTER — Telehealth: Payer: Self-pay | Admitting: Pharmacy Technician

## 2018-02-17 NOTE — Telephone Encounter (Signed)
Patient stated that he got married summer of 2019 to Anderson.  Patient stated that Phineas Semen works full time, has a checking account and filed taxes.  Asked patient to provide Thelma's financial information.  Patient acknowledged that he understood that if he did not provide requested financial information from spouse, that [facility] would not be able to provide additional medication assistance.  Patient given 30 days to provide information.  [facility]

## 2018-02-19 ENCOUNTER — Telehealth: Payer: Self-pay | Admitting: Pharmacist

## 2018-02-19 NOTE — Telephone Encounter (Signed)
02/19/2018 9:35:36 AM - Cialis/Lilly refill  02/19/2018 Printed Lilly refill request for Cialis 20mg  Take 1 tablet by mouth everyday as needed for Erectile Dysfunction-will take to [facility] for [place] to sign.Delos Haring

## 2018-02-24 ENCOUNTER — Telehealth: Payer: Self-pay | Admitting: Pharmacist

## 2018-02-24 NOTE — Telephone Encounter (Signed)
02/24/2018 8:32:47 AM - Advair-GSK refill & renewal  [id number]/74/4514 Faxed [employer] application for renewal & refill on Advair 250/50 Inhale 1 puff into the lungs two times a day.Delos Haring

## 2018-02-27 ENCOUNTER — Telehealth: Payer: Self-pay | Admitting: Pharmacist

## 2018-02-27 NOTE — Telephone Encounter (Signed)
02/27/2018 10:06:44 AM - Cialis 20mg  refill  02/27/2018 Faxed Lilly refill request for Cialis 20mg  Take 1 tablet by mouth everyday as needed for Erectile Dysfunction-the manufacturer will only send 30 pills per every 6 months (dispensing 5 per month).Delos Haring

## 2018-04-07 ENCOUNTER — Ambulatory Visit: Payer: Self-pay

## 2018-04-14 ENCOUNTER — Ambulatory Visit: Payer: Self-pay | Admitting: Family

## 2018-04-17 ENCOUNTER — Telehealth: Payer: Self-pay | Admitting: Pharmacist

## 2018-04-17 NOTE — Telephone Encounter (Signed)
04/17/2018 9:16:56 AM - Lantus Solostar refill  04/17/2018 Faxed Sanofi refill request for Lantus Solostar Max daily dose 12 units #1 (Inject 10 units under the skin daily, increase by 2 units every 3 days until AM BS <150).Delos Haring

## 2018-04-21 ENCOUNTER — Ambulatory Visit: Payer: Self-pay | Admitting: Family

## 2018-05-07 ENCOUNTER — Ambulatory Visit: Payer: Self-pay | Admitting: Ophthalmology

## 2018-05-15 ENCOUNTER — Telehealth: Payer: Self-pay

## 2018-05-15 NOTE — Telephone Encounter (Signed)
   TELEPHONE CALL NOTE  This patient has been deemed a candidate for follow-up tele-health visit to limit community exposure during the Covid-19 pandemic. I spoke with the patient via phone to discuss instructions. The patient was advised to review the section on consent for treatment as well. The patient will receive a phone call 2-3 days prior to their E-Visit at which time consent will be verbally confirmed. A Virtual Office Visit appointment type has been scheduled for 05/18/2018 with Darylene Price FNP.  Gaylord Shih, CMA 05/15/2018 11:49 AM

## 2018-05-15 NOTE — Telephone Encounter (Signed)
TELEPHONE CALL NOTE  Bradley Mckenzie Pro. has been deemed a candidate for a follow-up tele-health visit to limit community exposure during the Covid-19 pandemic. I spoke with the patient via phone to ensure availability of phone/video source, confirm preferred email & phone number, discuss instructions and expectations, and review consent.   I reminded Bradley Mckenzie. to be prepared with any vital sign and/or heart rhythm information that could potentially be obtained via home monitoring, at the time of his visit.  Finally, I reminded Bradley Mckenzie. to expect an e-mail containing a link for their video-based visit approximately 15 minutes before his visit, or alternatively, a phone call at the time of his visit if his visit is planned to be a phone encounter.  Did the patient verbally consent to treatment as below? YES  Bradley Mckenzie, CMA 05/15/2018 11:50 AM  CONSENT FOR TELE-HEALTH VISIT - PLEASE REVIEW  I hereby voluntarily request, consent and authorize [facility] and its employed or contracted physicians, physician assistants, nurse practitioners or other licensed health care professionals (the Practitioner), to provide me with telemedicine health care services (the "Services") as deemed necessary by the treating Practitioner. I acknowledge and consent to receive the Services by the Practitioner via telemedicine. I understand that the telemedicine visit will involve communicating with the Practitioner through telephonic communication technology and the disclosure of certain medical information by electronic transmission. I acknowledge that I have been given the opportunity to request an in-person assessment or other available alternative prior to the telemedicine visit and am voluntarily participating in the telemedicine visit.  I understand that I have the right to withhold or withdraw my consent to the use of telemedicine in the course of my care at any time, without  affecting my right to future care or treatment, and that the Practitioner or I may terminate the telemedicine visit at any time. I understand that I have the right to inspect all information obtained and/or recorded in the course of the telemedicine visit and may receive copies of available information for a reasonable fee.  I understand that some of the potential risks of receiving the Services via telemedicine include:  [name] Delay or interruption in medical evaluation due to technological equipment failure or disruption; . Information transmitted may not be sufficient (e.g. poor resolution of images) to allow for appropriate medical decision making by the Practitioner; and/or  . In rare instances, security protocols could fail, causing a breach of personal health information.  Furthermore, I acknowledge that it is my responsibility to provide information about my medical history, conditions and care that is complete and accurate to the best of my ability. I acknowledge that Practitioner's advice, recommendations, and/or decision may be based on factors not within their control, such as incomplete or inaccurate data provided by me or lack of visual representation. I understand that the practice of medicine is not an exact science and that Practitioner makes no warranties or guarantees regarding treatment outcomes. I acknowledge that I will receive a copy of this consent concurrently upon execution via email to the email address I last provided but may also request a printed copy by calling the office of [facility].    I understand that my insurance may be billed for this visit.   I have read or had this consent read to me. . I understand the contents of this consent, which adequately explains the benefits and risks of the Services being provided via telemedicine.  [name]  I have been provided ample opportunity to ask questions regarding this consent and the Services and have had my questions  answered to my satisfaction. . I give my informed consent for the services to be provided through the use of telemedicine in my medical care  By participating in this telemedicine visit I agree to the above.

## 2018-05-18 ENCOUNTER — Other Ambulatory Visit: Payer: Self-pay

## 2018-05-18 ENCOUNTER — Ambulatory Visit: Payer: Self-pay | Attending: Family | Admitting: Family

## 2018-05-18 DIAGNOSIS — Z9989 Dependence on other enabling machines and devices: Secondary | ICD-10-CM

## 2018-05-18 DIAGNOSIS — I10 Essential (primary) hypertension: Secondary | ICD-10-CM

## 2018-05-18 DIAGNOSIS — E119 Type 2 diabetes mellitus without complications: Secondary | ICD-10-CM

## 2018-05-18 DIAGNOSIS — I5022 Chronic systolic (congestive) heart failure: Secondary | ICD-10-CM

## 2018-05-18 DIAGNOSIS — G4733 Obstructive sleep apnea (adult) (pediatric): Secondary | ICD-10-CM

## 2018-05-18 MED ORDER — METFORMIN HCL ER 500 MG PO TB24: 500.0000 mg | ORAL_TABLET | Freq: Two times a day (BID) | ORAL | 3 refills | Status: DC

## 2018-05-18 MED ORDER — CETIRIZINE HCL 10 MG PO TABS: 10.0000 mg | ORAL_TABLET | Freq: Every day | ORAL | 3 refills | Status: DC

## 2018-05-18 MED ORDER — LOSARTAN POTASSIUM 100 MG PO TABS: 100.0000 mg | ORAL_TABLET | Freq: Every day | ORAL | 3 refills | Status: DC

## 2018-05-18 NOTE — Patient Instructions (Addendum)
Resume weighing daily and call for an overnight weight gain of > 2 pounds or a weekly weight gain of >5 pounds. 

## 2018-05-18 NOTE — Progress Notes (Signed)
Virtual Visit via Telephone Note    Evaluation Performed:  Follow-up visit  This visit type was conducted due to national recommendations for restrictions regarding the COVID-19 Pandemic (e.g. social distancing).  This format is felt to be most appropriate for this patient at this time.  All issues noted in this document were discussed and addressed.  No physical exam was performed (except for noted visual exam findings with Video Visits).  Please refer to the patient's chart (MyChart message for video visits and phone note for telephone visits) for the patient's consent to telehealth for [facility]  Date:  05/18/2018   ID:  Bradley Pitts., DOB 1965-05-31, MRN [id number]  Patient Location:  [address] [place] [address]   Provider location:   [facility], [place] [address]  PCP: [facility] Cardiologist:  No primary care provider on file. Electrophysiologist:  None   Chief Complaint:  Shortness of breath  History of Present Illness:    Bradley Lenin Kuhnle. is a 53 y.o. male who presents via audio/video conferencing for a telehealth visit today.  Patient verified DOB and address.  The patient does not have symptoms concerning for COVID-19 infection (fever, chills, cough, or new SHORTNESS OF BREATH).   Patient reports minimal shortness of breath upon moderate exertion. He describes this as chronic in nature having been present for several years. He has associated palpitations (at times), dry cough, rhinorrhea, head congestion, chronic difficulty sleeping and mild fatigue along with this. He denies any dizziness, swelling in his legs/ abdomen, chest pain or wheezing. He is unsure of his weight as his scales are no longer working.   Prior CV studies:   The following studies were reviewed today:  Echo report from 09/02/15 reviewed and showed an EF of 30-35% along with a PA pressure of 44 mmHG.  Past Medical History:   Diagnosis Date  . Cardiomyopathy ([place])    a. 01/2014 Echo: EF 25-30%;  b. ? ischemic vs non-ischemic.  He's never had an ischemic eval.  . Chronic combined systolic (congestive) and diastolic (congestive) heart failure ([place])    a. 01/2014 Echo: EF 25-30%. b. Echo 03/2015: Improved EF of 40-45%  . Depression   . Diabetes mellitus without complication ([place])    a. Not previously on outpt meds.  . Hyperlipidemia   . Hypertensive heart disease    a. Since his 43's.  . Morbid obesity ([place])   . Obstructive sleep apnea    a. Has not used CPAP since ~ 2012.   Past Surgical History:  Procedure Laterality Date  . HERNIA REPAIR  2004     Prior to Admission medications   Medication Sig Start Date End Date Taking? Authorizing Provider  aspirin EC 81 MG tablet Take 1 tablet (81 mg total) by mouth daily. 05/26/17  Yes Darylene Price A, FNP  atorvastatin (LIPITOR) 10 MG tablet TAKE ONE TABLET BY MOUTH EVERY DAY 01/20/18  Yes Darylene Price A, FNP  carvedilol (COREG) 12.5 MG tablet Take 12.5 mg by mouth 2 (two) times daily with a meal.   Yes [provider]  cetirizine (ZYRTEC) 10 MG tablet Take 1 tablet (10 mg total) by mouth daily. 05/18/18  Yes Hackney, Tina A, FNP  Fluticasone-Salmeterol (ADVAIR) 250-50 MCG/DOSE AEPB Inhale 1 puff into the lungs 2 (two) times daily. 04/03/17  Yes Doles-Johnson, Teah, NP  furosemide (LASIX) 40 MG tablet TAKE ONE TABLET BY MOUTH 2 TIMES A  DAY 11/29/16  Yes Hackney, Tina A, FNP  glipiZIDE (GLUCOTROL) 10 MG tablet TAKE ONE TABLET BY MOUTH EVERY DAY BEFORE BREAKFAST. 01/20/18  Yes Tawni Millers, MD  hydrALAZINE (APRESOLINE) 25 MG tablet TAKE ONE TABLET BY MOUTH 2 TIMES A DAY 01/20/18  Yes Darylene Price A, FNP  insulin glargine (LANTUS) 100 UNIT/ML injection Inject 0.1 mLs (10 Units total) into the skin daily. Increase by 2 units every three days if breakfast sugars less than 150 Patient taking differently: Inject 20 Units into the skin daily. Increase by 2 units every  three days if breakfast sugars less than 150 02/03/18  Yes Gayland Curry, MD  ipratropium-albuterol (DUONEB) 0.5-2.5 (3) MG/3ML SOLN Take 3 mLs by nebulization every 4 (four) hours as needed.   Yes [provider]  losartan (COZAAR) 100 MG tablet Take 1 tablet (100 mg total) by mouth daily. 05/18/18  Yes Hackney, Otila Kluver A, FNP  metFORMIN (GLUCOPHAGE-XR) 500 MG 24 hr tablet Take 1 tablet (500 mg total) by mouth 2 (two) times daily. 05/18/18  Yes Darylene Price A, FNP  potassium chloride SA (K-DUR,KLOR-CON) 20 MEQ tablet TAKE ONE TABLET BY MOUTH EVERY DAY 01/20/18  Yes Tawni Millers, MD  spironolactone (ALDACTONE) 25 MG tablet Take 1 tablet (25 mg total) by mouth daily. 05/26/17  Yes Darylene Price A, FNP  tadalafil (CIALIS) 20 MG tablet Take 1 tablet (20 mg total) by mouth daily as needed for erectile dysfunction. 08/06/17  Yes McGowan, Larene Beach A, PA-C  amoxicillin (AMOXIL) 500 MG tablet Take four tablets prior to dental procedure 01/22/18   Zara Council A, PA-C      Allergies:   Patient has no known allergies.   Social History   Tobacco Use  . Smoking status: Former Research scientist (life sciences)  . Smokeless tobacco: Never Used  . Tobacco comment: Quit in his 97's.  Substance Use Topics  . Alcohol use: Yes    Alcohol/week: 0.0 standard drinks    Comment: occassional alcohol use at special events  . Drug use: No    Comment: Used marijuana in his teens.     Family Hx: The patient's family history includes Cancer in his father, paternal uncle, and sister; Diabetes in his father; Heart attack (age of onset: 16) in his father; Heart disease in his mother.  ROS:   Please see the history of present illness.     All other systems reviewed and are negative.   Labs/Other Tests and Data Reviewed:    Recent Labs: 11/11/2017: ALT 22; BUN 14; Creatinine, Ser 1.33; Hemoglobin 12.2; Platelets 266; Potassium 4.4; Sodium 138; TSH 0.977   Recent Lipid Panel Lab Results  Component Value Date/Time   CHOL 138  11/11/2017 06:32 PM   CHOL 161 02/08/2014 04:27 AM   TRIG 119 11/11/2017 06:32 PM   TRIG 79 02/08/2014 04:27 AM   HDL 41 11/11/2017 06:32 PM   HDL 33 (L) 02/08/2014 04:27 AM   CHOLHDL 3.4 11/11/2017 06:32 PM   CHOLHDL 5.5 03/28/2015 04:01 AM   LDLCALC 73 11/11/2017 06:32 PM   LDLCALC 112 (H) 02/08/2014 04:27 AM    Wt Readings from Last 3 Encounters:  02/03/18 280 lb 12.8 oz (127.4 kg)  01/16/18 280 lb (127 kg)  12/13/17 280 lb (127 kg)     Exam:    Vital Signs:  There were no vitals taken for this visit.   Well nourished, well developed male in no  acute distress.   ASSESSMENT & PLAN:    1.Chronic heart  failure with reduced ejection fraction-  - NYHA Class II - euvolemic today based on patient's description of symptoms - not weighing as his scales have broke; will meet patient outside building entrance tomorrow morning to give him a scale so that he can weigh daily and call for an overnight weight gain of >2 pounds or a weekly weight gain of >5 pounds - trying to eat low sodium foods when he can but says it's difficult at times due to finances - drinking 64 ounces of water daily - consider changing his losartan to entresto once COVID-19 restrictions are lifted so we can closely monitor his BP and renal function  2: HTN- - not checking BP at home - went to [facility] on 02/03/2018 - BMP on 11/11/17 reviewed and shows sodium 138, potassium 4.4, creatinine 1.33 and GFR 72  3: Obstructive sleep apnea- - wearing CPAP nightly - says that he's gotten a new mask but he feels like it's leaking  4: Diabetes- - says that he hasn't checked his glucose lately - A1c on 02/03/2018 was 10.5%     COVID-19 Education: The signs and symptoms of COVID-19 were discussed with the patient and how to seek care for testing (follow up with PCP or arrange E-visit).  The importance of social distancing was discussed today.  Patient Risk:   After full review of this patients clinical  status, I feel that they are at least moderate risk at this time.  Time:   Today, I have spent 13 minutes with the patient with telehealth technology discussing medications, diet and symptoms to report.     Medication Adjustments/Labs and Tests Ordered: Current medicines are reviewed at length with the patient today.  Concerns regarding medicines are outlined above.   Tests Ordered: No orders of the defined types were placed in this encounter.  Medication Changes: Meds ordered this encounter  Medications  . cetirizine (ZYRTEC) 10 MG tablet    Sig: Take 1 tablet (10 mg total) by mouth daily.    Dispense:  90 tablet    Refill:  3  . losartan (COZAAR) 100 MG tablet    Sig: Take 1 tablet (100 mg total) by mouth daily.    Dispense:  90 tablet    Refill:  3  . metFORMIN (GLUCOPHAGE-XR) 500 MG 24 hr tablet    Sig: Take 1 tablet (500 mg total) by mouth 2 (two) times daily.    Dispense:  180 tablet    Refill:  3    Disposition:  Follow-up in 3 months or sooner for any questions/problems before then.   Signed, Alisa Graff, FNP  05/18/2018 11:05 AM    [facility]

## 2018-06-04 ENCOUNTER — Other Ambulatory Visit: Payer: Self-pay | Admitting: Family

## 2018-06-04 ENCOUNTER — Ambulatory Visit: Payer: Self-pay | Admitting: Pharmacist

## 2018-06-04 ENCOUNTER — Encounter: Payer: Self-pay | Admitting: Pharmacist

## 2018-06-04 ENCOUNTER — Other Ambulatory Visit: Payer: Self-pay

## 2018-06-04 ENCOUNTER — Other Ambulatory Visit: Payer: Self-pay | Admitting: Urology

## 2018-06-04 DIAGNOSIS — E119 Type 2 diabetes mellitus without complications: Secondary | ICD-10-CM

## 2018-06-04 DIAGNOSIS — Z79899 Other long term (current) drug therapy: Secondary | ICD-10-CM

## 2018-06-04 DIAGNOSIS — I5022 Chronic systolic (congestive) heart failure: Secondary | ICD-10-CM

## 2018-06-04 MED ORDER — LIRAGLUTIDE 18 MG/3ML ~~LOC~~ SOPN: PEN_INJECTOR | SUBCUTANEOUS | 3 refills | Status: DC

## 2018-06-04 MED ORDER — METFORMIN HCL 1000 MG PO TABS: 1000.0000 mg | ORAL_TABLET | Freq: Two times a day (BID) | ORAL | 3 refills | Status: DC

## 2018-06-04 MED ORDER — ALBUTEROL SULFATE HFA 108 (90 BASE) MCG/ACT IN AERS: 2.0000 | INHALATION_SPRAY | Freq: Four times a day (QID) | RESPIRATORY_TRACT | 2 refills | Status: DC | PRN

## 2018-06-04 NOTE — Progress Notes (Addendum)
[facility] Management [facility] Visit Note  Patient: Bradley Mckenzie. MRN: [id number] Date of Birth: [birth year] PCP: Patient, No Pcp Per   [name]. 53 y.o. male was called via telephone for an annual MTM/outreach visit today. Identity was confirmed by DOB and address.   There were no vitals taken for this visit.  Patient Information   Past Medical History:  Diagnosis Date  . Cardiomyopathy ([place])    a. 01/2014 Echo: EF 25-30%;  b. ? ischemic vs non-ischemic.  He's never had an ischemic eval.  . Chronic combined systolic (congestive) and diastolic (congestive) heart failure ([place])    a. 01/2014 Echo: EF 25-30%. b. Echo 03/2015: Improved EF of 40-45%  . Depression   . Diabetes mellitus without complication ([place])    a. Not previously on outpt meds.  . Hyperlipidemia   . Hypertensive heart disease    a. Since his 29's.  . Morbid obesity ([place])   . Obstructive sleep apnea    a. Has not used CPAP since ~ 2012.      Past Surgical History:  Procedure Laterality Date  . HERNIA REPAIR  2004     Family History  Problem Relation Age of Onset  . Heart disease Mother        alive & well.  [name] Heart attack Father 56       died @ 84 of cancer.  . Diabetes Father   . Cancer Father        "in his stomach"  . Cancer Sister   . Cancer Paternal Uncle     New Diagnoses (since last visit):   Family Support: good, lives with wife  Lifestyle diet: pt states he struggles with this and is not always sure what foods to eat or how to improve his diet, asked for help - told pt I would reach out to provider at [facility] to request referral to dietician            Social History   Substance and Sexual Activity  Alcohol Use Yes  . Alcohol/week: 0.0 standard drinks   Comment: occassional alcohol use at special events      Social History   Tobacco Use  Smoking Status Former Smoker  Smokeless Tobacco Never Used  Tobacco Comment   Quit in his 20's.      Health Maintenance  Topic  Date Due  . PNEUMOCOCCAL POLYSACCHARIDE VACCINE AGE 68-64 HIGH RISK  12/26/1968  . FOOT EXAM  12/26/1976  . OPHTHALMOLOGY EXAM  12/26/1976  . HIV Screening  12/26/1981  . TETANUS/TDAP  12/26/1985  . COLONOSCOPY  12/26/2016  . HEMOGLOBIN A1C  08/04/2018  . INFLUENZA VACCINE  08/15/2018   Outpatient Encounter Medications as of 06/04/2018  Medication Sig  . ASPIRIN LOW DOSE 81 MG EC tablet TAKE ONE TABLET BY MOUTH EVERY DAY  . atorvastatin (LIPITOR) 10 MG tablet TAKE ONE TABLET BY MOUTH EVERY DAY  . carvedilol (COREG) 12.5 MG tablet Take 12.5 mg by mouth 2 (two) times daily with a meal.  . cetirizine (ZYRTEC) 10 MG tablet Take 1 tablet (10 mg total) by mouth daily.  . Fluticasone-Salmeterol (ADVAIR) 250-50 MCG/DOSE AEPB Inhale 1 puff into the lungs 2 (two) times daily.  . furosemide (LASIX) 40 MG tablet TAKE ONE TABLET BY MOUTH 2 TIMES A DAY  . glipiZIDE (GLUCOTROL) 10 MG tablet TAKE ONE TABLET BY MOUTH EVERY DAY BEFORE BREAKFAST.  . hydrALAZINE (APRESOLINE) 25 MG tablet TAKE ONE TABLET BY MOUTH 2 TIMES A DAY  .  insulin glargine (LANTUS) 100 UNIT/ML injection Inject 0.1 mLs (10 Units total) into the skin daily. Increase by 2 units every three days if breakfast sugars less than 150 (Patient taking differently: Inject 20 Units into the skin daily. Increase by 2 units every three days if breakfast sugars less than 150)  . ipratropium-albuterol (DUONEB) 0.5-2.5 (3) MG/3ML SOLN Take 3 mLs by nebulization every 4 (four) hours as needed.  [name] losartan (COZAAR) 100 MG tablet Take 1 tablet (100 mg total) by mouth daily.  . metFORMIN (GLUCOPHAGE-XR) 500 MG 24 hr tablet Take 1 tablet (500 mg total) by mouth 2 (two) times daily.  . potassium chloride SA (K-DUR,KLOR-CON) 20 MEQ tablet TAKE ONE TABLET BY MOUTH EVERY DAY  . spironolactone (ALDACTONE) 25 MG tablet TAKE ONE TABLET BY MOUTH EVERY DAY  . tadalafil (CIALIS) 20 MG tablet Take 1 tablet (20 mg total) by mouth daily as needed for erectile dysfunction.   [name] amoxicillin (AMOXIL) 500 MG tablet Take four tablets prior to dental procedure  . [DISCONTINUED] aspirin EC 81 MG tablet Take 1 tablet (81 mg total) by mouth daily.  . [DISCONTINUED] spironolactone (ALDACTONE) 25 MG tablet Take 1 tablet (25 mg total) by mouth daily.   No facility-administered encounter medications on file as of 06/04/2018.      Assessment and Plan:  Compliance/Adherance: he can state names and frequency of medications when looking at bottles or a list. He does not know the indications for his medications so we went over each of them together. Pt agreed w/goal for next visit is to know what each medication is for. Adherence - QS1 MPR is 60%. Several meds Hydralazine, Losartan, Carvedilol, Atrovastatin, Spironolactone, were last filled on 01/20/18, and 2/4 20. Discussed w/pt and he told me that he rarely (<1x/month) misses doses that he uses a Fish farm manager. He states that he had extra from previous fills. Discussed importance of re-filling medications on time.  DM2: recent A1C 10.5 on 02/03/18. Discussed goal of <6.0. pt definitely feels that working with a dietician will help. States he checks BG every morning and it usually runs over 200. Yesterday am it was 123. He states that he needs strips and encouraged pt to reach out to [facility]. Pt is on Lantus 22 units daily and increasing by 2 units when BG is over 150, also on Metformin XR 500mg  BID, glipizide 10mg  qday. Will recommend possible switch to Victoza to provide cardio-protective benefit and help with possible weight loss since glipizide is a sulfonylurea and can contribute to weight gain, and also increase Metformin XR to 100mg  BID if pt can tolerate to help lower A1c and provide tighter BG control.  HLD: last lipid panel 11/11/17 TC 138, tg 119, HDL 41, LDL 73; on Atorvastatin 10mg  daily appropriate per guidelines, est ASCVD risk of 20.1%.     HTN: pt states he has a BP cuff at home but is is not currently functioning properly. pt  aware of goal BP <130/80. On Losartan 100mg  daily, Carvedilol 12.5mg  BID.  ZOX:[id number] 20 meq daily,  furosemide 40mg  BID (discussed pt could change 2nd dose time to in the afternoon since he is getting up to urinate at night if this is bothersome), Hydralazine 25mg  BID, Spironolactone 25mg  qday.  Mild intermittent Asthma/sinus allergies: pt states he is using Advair BID and that he now only used the duoneb once in the last 3 months. He states that the cetirizine HS helps and that when his sinuses act up this can trigger wheezing.  Pt denies nighttime awakenings or symptoms during the day. He does not have a resuce inhaler on file so I will request RX for Ventolin inh from PCP.  RTC in 95mos  PharmD f/u: A1C, lipid panel, goal for pt to know indications for all meds  Netta Neat, PharmD, [facility]) [phone]

## 2018-06-10 ENCOUNTER — Telehealth: Payer: Self-pay

## 2018-06-10 ENCOUNTER — Telehealth: Payer: Self-pay | Admitting: Pharmacist

## 2018-06-10 NOTE — Telephone Encounter (Signed)
Entered in error

## 2018-06-10 NOTE — Telephone Encounter (Signed)
06/10/2018 3:08:39 PM - Victoza & tips  06/10/2018 Received pharmacy printout for [place] with 0.6mg  under the skin for 1 week, then increase to 1.2mg  under the skin daily, also Novofine 32G tips to use daily with Victoza- Printed scripts taking to [facility] for provider to sign and mailing patient his portion of [employer] application to sign & return with household income/support, taxes/4506T, and list of additional information needed that Inez Catalina gave patient a checklist in Feb, also mailing recertification packet. AJ  06/10/2018 3:06:08 PM - Ventolin HFA New Med  06/10/2018 Received pharmacy printout for new med-Ventolin HFA 90 mcg Inhale 2 puffs into the lungs every 6 hours as needed for wheezing or shortness of breath. Printed script-taking to [facility] for provider to sign, also [place] application-mailing patient his portion to sign & return with household income/support, taxes/4506T, and list of additional information needed that Inez Catalina gave patient a checklist in Feb, also mailing recertification packet. Delos Haring

## 2018-06-11 ENCOUNTER — Telehealth: Payer: Self-pay | Admitting: Pharmacy Technician

## 2018-06-11 NOTE — Telephone Encounter (Signed)
Received message from Lurena Nida indicating that patient is applying for charity care.  Lorrie stated that patient still needed to provide Letter of Support from wife.  I made Lorrie aware that I spoke with patient on 02/17/18.  Patient indicated that Evalee Mutton, his spouse was working full-time, has a checking account and filed taxes.  Requested patient provide Thelma's financial information.  Patient has failed to provide that information to [facility].  Failure to provide requested financial information, can result in [facility]'s inability to provide medication assistance.  [facility]

## 2018-06-12 ENCOUNTER — Telehealth: Payer: Self-pay | Admitting: Pharmacist

## 2018-06-12 NOTE — Telephone Encounter (Signed)
06/12/2018 2:08:58 PM - Lantus Solostar refill  06/12/2018 Printed [employer] refill request for [name] Max daily dose 12 units, will take to [facility] next week.Delos Haring

## 2018-06-26 ENCOUNTER — Telehealth: Payer: Self-pay | Admitting: Pharmacist

## 2018-06-26 NOTE — Telephone Encounter (Signed)
06/26/2018 10:01:24 AM - Lantus Solostar refill to [facility]  06/26/2018 Faxed [employer] refill request for [name] Max daily dose 12 units #1--Inject 10 units daily, increase by 2 units every 3 days until AM BS<150.Bradley Mckenzie

## 2018-06-30 ENCOUNTER — Other Ambulatory Visit: Payer: Self-pay

## 2018-06-30 ENCOUNTER — Encounter: Payer: Self-pay | Admitting: Endocrinology

## 2018-06-30 ENCOUNTER — Ambulatory Visit: Payer: Self-pay | Admitting: Endocrinology

## 2018-06-30 DIAGNOSIS — N182 Chronic kidney disease, stage 2 (mild): Secondary | ICD-10-CM

## 2018-06-30 DIAGNOSIS — E118 Type 2 diabetes mellitus with unspecified complications: Secondary | ICD-10-CM

## 2018-06-30 DIAGNOSIS — I429 Cardiomyopathy, unspecified: Secondary | ICD-10-CM

## 2018-06-30 MED ORDER — METFORMIN HCL 500 MG PO TABS: 500.0000 mg | ORAL_TABLET | Freq: Two times a day (BID) | ORAL | 4 refills | Status: DC

## 2018-06-30 MED ORDER — INSULIN GLARGINE 100 UNIT/ML ~~LOC~~ SOLN: 20.0000 [IU] | Freq: Every day | SUBCUTANEOUS | 4 refills | Status: DC

## 2018-06-30 MED ORDER — VICTOZA 18 MG/3ML ~~LOC~~ SOPN: 1.8000 mg | PEN_INJECTOR | SUBCUTANEOUS | 4 refills | Status: DC

## 2018-06-30 NOTE — Patient Instructions (Signed)
Try to increase Victoza to 1.8 mg.  It may cause nausea, vomiting or diarrhea and if it is bad, you can go back down to 1.2 mg.    If your blood sugars are less than 100, you can reduce Lantus by 5 units at a time. It is possible that you could stop your insulin if your sugars are usually less than 130 and you can keep going down on the insulin.    Jardiance or [name] could be great diabetes medications to add for your heart and kidneys.  You might need to reduce your furosemide if you started that, so we would need to do so with your heart doctor and primary care doctor helping to monitor you.    Be well.  Prudencio Burly, MD, PhD

## 2018-06-30 NOTE — Progress Notes (Signed)
Follow up Diabetes/ Endocrine [facility]     Patient ID: Bradley Mckenzie., male   DOB: [birth year], 53 y.o.   MRN: [id number] Assessment/Plan:  Bradley Crehan. is a 53 y.o. male who is seen in follow up for No primary diagnosis found. at the request of Patient, No Pcp Per.  1. Diabetes mellitus Patient reports taking metformin 500mg  BID, Victoza 1.2 mg daily, and Lantus 20u daily. Seems to tolerate medications well, with little GI distress with metformin. Reports having nocturia that has improved since on medications and infrequent episodes of tingling bilaterally in toes/fingers. Home bp fasting glucose readings in the past weeks show bg 113-120. Denies any symptoms of hypoglycemia. Hoping to work with dietician for better diet control. Recent HbA1c of 10.5 on 02/03/2018 , down from 11.6-11.9 in previous readings in 2019  - Microalbumin/Cr ratio test - HbA1c  - Schedule patient for diabetic retinopathy screening (when possible) - Continue metformin 500mg  BID - Patient advised to consider increasing Victoza to 1.8mg  given no n/v or diarrhea - Patient advised to cut Lantus by 5u at a time if bg readings show fasting bg <100 - Consider Jardiance or Farxiga at next appt. given patient's CHF and CKD  Patient Instructions  Try to increase Victoza to 1.8 mg.  It may cause nausea, vomiting or diarrhea and if it is bad, you can go back down to 1.2 mg.    If your blood sugars are less than 100, you can reduce Lantus by 5 units at a time. It is possible that you could stop your insulin if your sugars are usually less than 130 and you can keep going down on the insulin.    Jardiance or [name] could be great diabetes medications to add for your heart and kidneys.  You might need to reduce your furosemide if you started that, so we would need to do so with your heart doctor and primary care doctor helping to monitor you.    Be well.  Prudencio Burly, MD, PhD    Subjective:  HPI: Bradley Mckenzie is a 53 yo  M with PMHx of CHF, depression, HLD, diabetes who presents to follow-up for his diabetes. Currently taking metformin 500mg  BID, Victoza 1.2mg  daily, and Lantus 20u daily. He has ceased taking glipizide. Reports occasional episodes of GI distress, but endorses improved than previous GI distress with higher metformin doses. Reports decrease in appetite with cessation of glipizide and initiation of Victoza. Home bg readings in the 113-120 in the past weeks. Denies shaking, chills, passing out, increased thirst, and blurry vision. Endorses nocturia 1-2x a night currently, which has improved upon starting diabetes medications. Infrequent episodes of tingling sensations in toes/fingers on both sides. Describes when he wakes up with these tingling, takes about half a day to regain sensation. Hoping to work with dietician to better manage his diet and have a diabetes meal plan. Exercise is limited given inability to go out to the track during COVID-19 pandemic. No GI adverse effects from Victoza 1.2 for several weeks.    Review of Systems  As per HPI  Abram Indy Kuck.  has a past medical history of Cardiomyopathy ([place]), Chronic combined systolic (congestive) and diastolic (congestive) heart failure ([place]), Depression, Diabetes mellitus without complication ([place]), Hyperlipidemia, Hypertensive heart disease, Morbid obesity ([place]), and Obstructive sleep apnea.  Family History, Social History, current Medications and allergies reviewed and updated in Epic.  Objective:  There were no vitals taken for this visit. No physical exam.  Data : I have personally reviewed pertinent labs and imaging studies, if indicated,  with the patient in clinic today. Prior Echo in 2017 w EF 30-35%.    Lab Orders  No laboratory test(s) ordered today    HC Readings from Last 3 Encounters:  No data found for [facility]    Wt Readings from Last 3 Encounters:  02/03/18 280 lb 12.8 oz (127.4 kg)  01/16/18 280 lb (127 kg)  12/13/17 280 lb  (127 kg)   Attestation: I interviewed, examined and discussed the patient with the student at the time of the visit.  I agree with the findings and plan documented.   Prudencio Burly, MD, PhD

## 2018-07-08 ENCOUNTER — Telehealth: Payer: Self-pay | Admitting: Pharmacist

## 2018-07-08 NOTE — Telephone Encounter (Signed)
07/08/2018 10:09:41 AM - Patient in office to sign forms/gave him info  07/08/2018 This patient came to our office on 07/03/2018-got patient to sign the Ventolin & Victoza applications. Explained to patient that I need 1 month of his wife's check stubs, need copy of wife's taxes if she claimed him on her taxes, and a Recert packet to complete & return ASAP. Explained to patient that I could not order these meds until I receive wife's income/taxes, he stated he would return as soon as he could.Bradley Mckenzie

## 2018-07-16 ENCOUNTER — Telehealth: Payer: Self-pay | Admitting: Family

## 2018-07-16 ENCOUNTER — Other Ambulatory Visit: Payer: Self-pay

## 2018-07-16 ENCOUNTER — Telehealth: Payer: Self-pay | Admitting: Pharmacist

## 2018-07-16 DIAGNOSIS — E118 Type 2 diabetes mellitus with unspecified complications: Secondary | ICD-10-CM

## 2018-07-16 LAB — POCT GLYCOSYLATED HEMOGLOBIN (HGB A1C): Hemoglobin A1C: 8 % — AB (ref 4.0–5.6)

## 2018-07-16 MED ORDER — POTASSIUM CHLORIDE CRYS ER 20 MEQ PO TBCR: 20.0000 meq | EXTENDED_RELEASE_TABLET | Freq: Two times a day (BID) | ORAL | 3 refills | Status: DC

## 2018-07-16 MED ORDER — FUROSEMIDE 40 MG PO TABS: 40.0000 mg | ORAL_TABLET | Freq: Two times a day (BID) | ORAL | 3 refills | Status: DC

## 2018-07-16 MED ORDER — FARXIGA 10 MG PO TABS: 10.0000 mg | ORAL_TABLET | Freq: Every day | ORAL | 3 refills | Status: DC

## 2018-07-16 NOTE — Telephone Encounter (Signed)
Spoke with patient regarding the use of Farxiga in patient with diabetes to reduce the risk of hospitalization. Patient's PCP at [facility] had also spoken with him about this.   Did advise patient that we may need to decrease his diuretic if he has a good response to [place]. Explained that should he start dramatically losing weight or he feels dizzy, that he can decrease his furosemide/ potassium to just once daily (currently taking them twice daily). He does not have a means to check his BP at home.   Emphasized that he needed to watch his glucose levels and let his PCP know if they start getting low as his insulin may need to be adjusted.   Patient verbalized understanding.

## 2018-07-16 NOTE — Telephone Encounter (Signed)
Left Message - TO REMIND PATIENT THAT WE NEED RECERT, AND WIFE FINANCIAL INFORMATION TO ORDER MEDS-HAVE RECD NEW MED FROM [facility] AND NEED THIS INFORMATION TO ORDER, ALSO DISCUSSED WITH [place].AJ    By Anastasio Champion

## 2018-07-17 LAB — MICROALBUMIN, URINE: Microalbumin, Urine: 6.9 ug/mL

## 2018-07-23 ENCOUNTER — Ambulatory Visit: Payer: Self-pay | Admitting: Adult Health Nurse Practitioner

## 2018-07-23 ENCOUNTER — Other Ambulatory Visit: Payer: Self-pay

## 2018-07-23 DIAGNOSIS — E118 Type 2 diabetes mellitus with unspecified complications: Secondary | ICD-10-CM

## 2018-07-23 DIAGNOSIS — I10 Essential (primary) hypertension: Secondary | ICD-10-CM

## 2018-07-23 DIAGNOSIS — Z0000 Encounter for general adult medical examination without abnormal findings: Secondary | ICD-10-CM

## 2018-07-23 DIAGNOSIS — E782 Mixed hyperlipidemia: Secondary | ICD-10-CM

## 2018-07-23 NOTE — Progress Notes (Signed)
Patient: Bradley Mckenzie. Male    DOB: December 13, 1965   53 y.o.   MRN: [id number] Visit Date: 07/23/2018  Today's Provider: [facility]   No chief complaint on file.  Subjective:    HPI  Telephonic visit.   Recent visit with Endo-  increase of Victoza 1.8- hasn't received medication yet. Still on 1.2. On 20 units of Lantus. A1c was down from 10.5 to 8.  States that he is eating less while he has been on Victoza. States that he is walking occasionally.   States that his fingers are feeling numb- sometimes worse at night. States that it is worse in his left hand. Especially when he lays on that side.     Taking all BP medications as directed.  FU with HF clinic- last spoke with Otila Kluver, NP on 7.2 with plans to start on Farxiga and decrease Lasix dosing. Last visit was 5.4.2020.  Taking HLD medications.    No Known Allergies Previous Medications   ALBUTEROL (VENTOLIN HFA) 108 (90 BASE) MCG/ACT INHALER    Inhale 2 puffs into the lungs every 6 (six) hours as needed for wheezing or shortness of breath.   ASPIRIN LOW DOSE 81 MG EC TABLET    TAKE ONE TABLET BY MOUTH EVERY DAY   ATORVASTATIN (LIPITOR) 10 MG TABLET    TAKE ONE TABLET BY MOUTH EVERY DAY   CARVEDILOL (COREG) 12.5 MG TABLET    Take 12.5 mg by mouth 2 (two) times daily with a meal.   CETIRIZINE (ZYRTEC) 10 MG TABLET    Take 1 tablet (10 mg total) by mouth daily.   DAPAGLIFLOZIN PROPANEDIOL (FARXIGA) 10 MG TABS TABLET    Take 10 mg by mouth daily.   FLUTICASONE-SALMETEROL (ADVAIR) 250-50 MCG/DOSE AEPB    Inhale 1 puff into the lungs 2 (two) times daily.   FUROSEMIDE (LASIX) 40 MG TABLET    Take 1 tablet (40 mg total) by mouth 2 (two) times daily.   HYDRALAZINE (APRESOLINE) 25 MG TABLET    TAKE ONE TABLET BY MOUTH 2 TIMES A DAY   INSULIN GLARGINE (LANTUS) 100 UNIT/ML INJECTION    Inject 0.2 mLs (20 Units total) into the skin daily. Decrease by 5 units every three days is AM sugars less than 100. Increase by 2 units every  three days if AM sugars more than 150   IPRATROPIUM-ALBUTEROL (DUONEB) 0.5-2.5 (3) MG/3ML SOLN    Take 3 mLs by nebulization every 4 (four) hours as needed.   LIRAGLUTIDE (VICTOZA) 18 MG/3ML SOPN    Inject 0.3 mLs (1.8 mg total) into the skin 1 day or 1 dose for 1 dose.   LOSARTAN (COZAAR) 100 MG TABLET    Take 1 tablet (100 mg total) by mouth daily.   METFORMIN (GLUCOPHAGE) 500 MG TABLET    Take 1 tablet (500 mg total) by mouth 2 (two) times daily with a meal.   POTASSIUM CHLORIDE SA (K-DUR) 20 MEQ TABLET    Take 1 tablet (20 mEq total) by mouth 2 (two) times daily.   SPIRONOLACTONE (ALDACTONE) 25 MG TABLET    TAKE ONE TABLET BY MOUTH EVERY DAY   TADALAFIL (CIALIS) 20 MG TABLET    Take 1 tablet (20 mg total) by mouth daily as needed for erectile dysfunction.    Review of Systems  All other systems reviewed and are negative.   Social History   Tobacco Use  . Smoking status: Former Research scientist (life sciences)  . Smokeless tobacco: Never Used  . Tobacco comment:  Quit in his 69's.  Substance Use Topics  . Alcohol use: Yes    Alcohol/week: 0.0 standard drinks    Comment: occassional alcohol use at special events   Objective:   There were no vitals taken for this visit.  Physical Exam  No PE.     Assessment & Plan:         DM:  Improved.  Encourage diabetic diet and exercise.  Continue current medication regimen. Plans to start Farxiga and decrease Lasix once he begins.  Continue to FU with Endo.   Wear a wrist splint on the left side to see if it helps.  ?carpel tunnel vs diabetic neuropathy.  Supportive care. FU precautions given.    HTN:  Goal BP <140/90.  Continue current medication regimen.  Encourage low salt diet and exercise.  Continue to FU with HF clinic.   HLD:  Continue current regimen.  Encourage low cholesterol, low fat diet and exercise.   Will order routine labs.       [facility]

## 2018-07-24 ENCOUNTER — Telehealth: Payer: Self-pay | Admitting: Pharmacy Technician

## 2018-07-24 LAB — CBC WITH DIFFERENTIAL
Basophils Absolute: 0.1 10*3/uL (ref 0.0–0.2)
Basos: 1 %
EOS (ABSOLUTE): 0.4 10*3/uL (ref 0.0–0.4)
Eos: 4 %
Hematocrit: 34.9 % — ABNORMAL LOW (ref 37.5–51.0)
Hemoglobin: 11.6 g/dL — ABNORMAL LOW (ref 13.0–17.7)
Immature Grans (Abs): 0 10*3/uL (ref 0.0–0.1)
Immature Granulocytes: 0 %
Lymphocytes Absolute: 2.2 10*3/uL (ref 0.7–3.1)
Lymphs: 21 %
MCH: 27.8 pg (ref 26.6–33.0)
MCHC: 33.2 g/dL (ref 31.5–35.7)
MCV: 84 fL (ref 79–97)
Monocytes Absolute: 1 10*3/uL — ABNORMAL HIGH (ref 0.1–0.9)
Monocytes: 9 %
Neutrophils Absolute: 6.9 10*3/uL (ref 1.4–7.0)
Neutrophils: 65 %
RBC: 4.18 x10E6/uL (ref 4.14–5.80)
RDW: 14.3 % (ref 11.6–15.4)
WBC: 10.6 10*3/uL (ref 3.4–10.8)

## 2018-07-24 LAB — COMPREHENSIVE METABOLIC PANEL
ALT: 20 IU/L (ref 0–44)
AST: 15 IU/L (ref 0–40)
Albumin/Globulin Ratio: 1.4 (ref 1.2–2.2)
Albumin: 4.2 g/dL (ref 3.8–4.9)
Alkaline Phosphatase: 92 IU/L (ref 39–117)
BUN/Creatinine Ratio: 11 (ref 9–20)
BUN: 13 mg/dL (ref 6–24)
Bilirubin Total: <0.2 mg/dL (ref 0.0–1.2)
CO2: 21 mmol/L (ref 20–29)
Calcium: 9.6 mg/dL (ref 8.7–10.2)
Chloride: 103 mmol/L (ref 96–106)
Creatinine, Ser: 1.17 mg/dL (ref 0.76–1.27)
GFR calc Af Amer: 83 mL/min/{1.73_m2} (ref >59)
GFR calc non Af Amer: 72 mL/min/{1.73_m2} (ref >59)
Globulin, Total: 2.9 g/dL (ref 1.5–4.5)
Glucose: 154 mg/dL — ABNORMAL HIGH (ref 65–99)
Potassium: 3.8 mmol/L (ref 3.5–5.2)
Sodium: 140 mmol/L (ref 134–144)
Total Protein: 7.1 g/dL (ref 6.0–8.5)

## 2018-07-24 LAB — HEMOGLOBIN A1C
Est. average glucose Bld gHb Est-mCnc: 197 mg/dL
Hgb A1c MFr Bld: 8.5 % — ABNORMAL HIGH (ref 4.8–5.6)

## 2018-07-24 LAB — LIPID PANEL
Chol/HDL Ratio: 3.2 ratio (ref 0.0–5.0)
Cholesterol, Total: 135 mg/dL (ref 100–199)
HDL: 42 mg/dL (ref >39)
LDL Calculated: 73 mg/dL (ref 0–99)
Triglycerides: 101 mg/dL (ref 0–149)
VLDL Cholesterol Cal: 20 mg/dL (ref 5–40)

## 2018-07-24 LAB — TSH: TSH: 1.62 u[IU]/mL (ref 0.450–4.500)

## 2018-07-24 NOTE — Telephone Encounter (Signed)
Ms. Carline, spouse of patient came by inquiring as to why Ventolin & Victoza had not been ordered.  Explained that we still need last 30 days of pay stubs from Ms. Downard and 2019 tax return.  Ms. Wieczorek indicated that she was not sure when she would file taxes because she owes money.  Explained that [place] will require a copy of 2019 taxes after 07/29/18.  Ms. Brallier stated that she would try to provide a copy of the taxes and pay stub by that date.  [facility]

## 2018-07-31 ENCOUNTER — Telehealth: Payer: Self-pay | Admitting: Pharmacy Technician

## 2018-07-31 NOTE — Telephone Encounter (Signed)
Received 2020 proof of income.  Patient eligible to receive [facility] assistance at [facility] as long as eligibility requirements continue to be met.  [facility]

## 2018-08-07 ENCOUNTER — Telehealth: Payer: Self-pay | Admitting: Pharmacist

## 2018-08-07 NOTE — Telephone Encounter (Signed)
08/07/2018 2:18:43 PM - Victoza & tips app to [facility]  08/07/2018 [employer] application for [employer] 1.8mg  into the skin daily #4 & Novofine 32G tips use daily with Victoza, also included [employer].AJ   08/07/2018 2:17:19 PM - Advair & Ventolin scripts to [facility]  08/07/2018 Faxed Ventolin HFA 22mcg Inhale 2 puffs into the lungs every 6 hours as needed for wheezing or shortness of breath & Advair 250/50 Inhale 1 puff into the lungs two times a day.Delos Haring

## 2018-08-14 NOTE — Progress Notes (Signed)
Patient ID: Bradley Mckenzie., male    DOB: May 23, 1965, 53 y.o.   MRN: [id number]  HPI  BradleyMckenzie is a 53 y/o male who has a history of diabetes, HTN, obstructive sleep apnea, depression, hyperlipidemia, morbid obesity and heart failure with a reduced ejection fraction. Remote tobacco exposure.   Last echo was done 09/02/15 with an EF of 30-35%, mild mitral regurgitation and no aortic stenosis. EF has declined from 40-45% in March 2017 but he had been inconsistent taking his medications & not using his CPAP during this time.  Has not been admitted or been in the ED in the last 6 months.   Returns today for a follow-up visit with a chief complaint of minimal shortness of breath upon moderate exertion. He describes this as chronic in nature having been present for several years. He has associated fatigue, cough and sporadic wheezing along with this. He denies any difficulty sleeping, dizziness, abdominal distention, palpitations, pedal edema, chest pain or weight gain.  Has been started on farxiga and has noticed that he's urinating "a lot" more since starting the medication.   Past Medical History:  Diagnosis Date  . Cardiomyopathy ([place])    a. 01/2014 Echo: EF 25-30%;  b. ? ischemic vs non-ischemic.  He's never had an ischemic eval.  . Chronic combined systolic (congestive) and diastolic (congestive) heart failure ([place])    a. 01/2014 Echo: EF 25-30%. b. Echo 03/2015: Improved EF of 40-45%  . Depression   . Diabetes mellitus without complication ([place])    a. Not previously on outpt meds.  . Hyperlipidemia   . Hypertensive heart disease    a. Since his 24's.  . Morbid obesity ([place])   . Obstructive sleep apnea    a. Has not used CPAP since ~ 2012.   Past Surgical History:  Procedure Laterality Date  . HERNIA REPAIR  2004   Family History  Problem Relation Age of Onset  . Heart disease Mother        alive & well.  [name] Heart attack Father 3       died @ 86 of cancer.  . Diabetes Father    . Cancer Father        "in his stomach"  . Cancer Sister   . Cancer Paternal Uncle    Social History   Tobacco Use  . Smoking status: Former Research scientist (life sciences)  . Smokeless tobacco: Never Used  . Tobacco comment: Quit in his 24's.  Substance Use Topics  . Alcohol use: Yes    Alcohol/week: 0.0 standard drinks    Comment: occassional alcohol use at special events   Prior to Admission medications   Medication Sig Start Date End Date Taking? Authorizing Provider  albuterol (VENTOLIN HFA) 108 (90 Base) MCG/ACT inhaler Inhale 2 puffs into the lungs every 6 (six) hours as needed for wheezing or shortness of breath. 06/04/18  Yes McGowan, Shannon A, PA-C  ASPIRIN LOW DOSE 81 MG EC tablet TAKE ONE TABLET BY MOUTH EVERY DAY 06/04/18  Yes Darylene Price A, FNP  atorvastatin (LIPITOR) 10 MG tablet TAKE ONE TABLET BY MOUTH EVERY DAY 01/20/18  Yes Hackney, Tina A, FNP  carvedilol (COREG) 12.5 MG tablet Take 12.5 mg by mouth 2 (two) times daily with a meal.   Yes [provider]  cetirizine (ZYRTEC) 10 MG tablet Take 1 tablet (10 mg total) by mouth daily. 05/18/18  Yes Darylene Price A, FNP  dapagliflozin propanediol (FARXIGA) 10 MG TABS tablet Take 10 mg by  mouth daily. 07/16/18  Yes Hackney, Tina A, FNP  Fluticasone-Salmeterol (ADVAIR) 250-50 MCG/DOSE AEPB Inhale 1 puff into the lungs 2 (two) times daily. 04/03/17  Yes Doles-Johnson, Teah, NP  furosemide (LASIX) 40 MG tablet Take 1 tablet (40 mg total) by mouth 2 (two) times daily. 07/16/18 10/14/18 Yes Darylene Price A, FNP  hydrALAZINE (APRESOLINE) 25 MG tablet TAKE ONE TABLET BY MOUTH 2 TIMES A DAY 01/20/18  Yes Darylene Price A, FNP  insulin glargine (LANTUS) 100 UNIT/ML injection Inject 0.2 mLs (20 Units total) into the skin daily. Decrease by 5 units every three days is AM sugars less than 100. Increase by 2 units every three days if AM sugars more than 150 06/30/18  Yes Buse, John B, MD  ipratropium-albuterol (DUONEB) 0.5-2.5 (3) MG/3ML SOLN Take 3 mLs by  nebulization every 4 (four) hours as needed.   Yes [provider]  liraglutide (VICTOZA) 18 MG/3ML SOPN Inject 0.3 mLs (1.8 mg total) into the skin 1 day or 1 dose for 1 dose. 06/30/18 08/17/18 Yes Buse, Hewitt Blade, MD  losartan (COZAAR) 100 MG tablet Take 1 tablet (100 mg total) by mouth daily. 05/18/18  Yes Darylene Price A, FNP  metFORMIN (GLUCOPHAGE) 500 MG tablet Take 1 tablet (500 mg total) by mouth 2 (two) times daily with a meal. 06/30/18  Yes Buse, Hewitt Blade, MD  potassium chloride SA (K-DUR) 20 MEQ tablet Take 1 tablet (20 mEq total) by mouth 2 (two) times daily. 07/16/18  Yes Darylene Price A, FNP  spironolactone (ALDACTONE) 25 MG tablet TAKE ONE TABLET BY MOUTH EVERY DAY 06/04/18  Yes Darylene Price A, FNP  tadalafil (CIALIS) 20 MG tablet Take 1 tablet (20 mg total) by mouth daily as needed for erectile dysfunction. 08/06/17  Yes McGowan, Hunt Oris, PA-C    Review of Systems  Constitutional: Positive for fatigue. Negative for appetite change.  HENT: Positive for congestion and postnasal drip. Negative for sore throat.   Eyes: Negative.   Respiratory: Positive for cough (at times), shortness of breath (at times) and wheezing (at times). Negative for chest tightness.   Cardiovascular: Negative for chest pain, palpitations and leg swelling.  Gastrointestinal: Negative for abdominal distention, abdominal pain and nausea.  Endocrine: Negative.   Genitourinary: Negative.   Musculoskeletal: Negative for back pain and neck pain.  Skin: Negative.   Allergic/Immunologic: Negative.   Neurological: Negative for dizziness and light-headedness.  Hematological: Negative for adenopathy. Does not bruise/bleed easily.  Psychiatric/Behavioral: Negative for dysphoric mood, sleep disturbance and suicidal ideas. The patient is not nervous/anxious.    Vitals:   08/17/18 1048  BP: 127/75  Pulse: (!) 106  Resp: 18  SpO2: 97%  Weight: 286 lb 6 oz (129.9 kg)  Height: 5\' 9"  (1.753 m)   Wt Readings from Last  3 Encounters:  08/17/18 286 lb 6 oz (129.9 kg)  02/03/18 280 lb 12.8 oz (127.4 kg)  01/16/18 280 lb (127 kg)   Lab Results  Component Value Date   CREATININE 1.17 07/23/2018   CREATININE 1.33 (H) 11/11/2017   CREATININE 1.25 08/05/2017    Physical Exam  Constitutional: He is oriented to person, place, and time. He appears well-developed and well-nourished.  HENT:  Head: Normocephalic and atraumatic.  Neck: Normal range of motion. Neck supple. No JVD present.  Cardiovascular: Normal rate and regular rhythm.  Pulmonary/Chest: Effort normal. He has wheezes (few in bilateral lower lobes). He has no rales.  Abdominal: Soft. He exhibits no distension. There is no abdominal tenderness.  Musculoskeletal:        General: No tenderness or edema.  Neurological: He is alert and oriented to person, place, and time.  Skin: Skin is warm and dry.  Psychiatric: He has a normal mood and affect. His behavior is normal.  Nursing note and vitals reviewed.   Assessment & Plan:  1: Chronic heart failure with reduced ejection fraction-  - NYHA Class II - euvolemic today - weighing daily and he was reminded to call for an overnight weight gain of >2 pounds or a weekly weight gain of >5 pounds - weight unchanged since last here 10 months ago - trying to eat low sodium foods when he can but says it's difficult at times due to finances - drinking 64 ounces of water daily - needs updated echo; will contact [facility] to see if they can schedule it - tachycardic today but says that he hasn't taken any of his medications yet today - he says that he's urinating a lot but without pain, burning etc since starting farxiga. Advised that he could decrease his furosemide to 40mg  QAM and take the 2nd dose as needed for above weight gain, edema, shortness of breath - he says that he'll have to take his pill box to the pharmacy to have them show him which tablet is the furosemide because he puts all his  medications in pill boxes when he gets them and then throws the bottles away  2: HTN- - BP looks good today & he hasn't taken his medications yet today - went to [facility] on 07/23/2018 - BMP on 07/23/2018 reviewed and shows sodium 140, potassium 3.8, creatinine 1.17 and GFR 83  3: Obstructive sleep apnea- - wearing CPAP nightly although he still doesn't feel like it's fitting correctly - he's going to talk to provider at [facility] about this  4: Diabetes- - glucose this morning was 86 - A1c on 07/23/2018 was 8.5%  Patient did not bring his medications nor a list. Each medication was verbally reviewed with the patient and he was encouraged to bring the bottles to every visit to confirm accuracy of list.  Return in 6 months or sooner for any questions/problems before then.

## 2018-08-17 ENCOUNTER — Ambulatory Visit: Payer: Self-pay | Attending: Family | Admitting: Family

## 2018-08-17 ENCOUNTER — Encounter: Payer: Self-pay | Admitting: Family

## 2018-08-17 ENCOUNTER — Other Ambulatory Visit: Payer: Self-pay

## 2018-08-17 ENCOUNTER — Telehealth: Payer: Self-pay | Admitting: Pharmacist

## 2018-08-17 VITALS — BP 127/75 | HR 106 | Resp 18 | Ht 69.0 in | Wt 286.4 lb

## 2018-08-17 DIAGNOSIS — Z6841 Body Mass Index (BMI) 40.0 and over, adult: Secondary | ICD-10-CM | POA: Insufficient documentation

## 2018-08-17 DIAGNOSIS — Z833 Family history of diabetes mellitus: Secondary | ICD-10-CM | POA: Insufficient documentation

## 2018-08-17 DIAGNOSIS — Z8249 Family history of ischemic heart disease and other diseases of the circulatory system: Secondary | ICD-10-CM | POA: Insufficient documentation

## 2018-08-17 DIAGNOSIS — I10 Essential (primary) hypertension: Secondary | ICD-10-CM

## 2018-08-17 DIAGNOSIS — Z87891 Personal history of nicotine dependence: Secondary | ICD-10-CM | POA: Insufficient documentation

## 2018-08-17 DIAGNOSIS — Z7982 Long term (current) use of aspirin: Secondary | ICD-10-CM | POA: Insufficient documentation

## 2018-08-17 DIAGNOSIS — Z79899 Other long term (current) drug therapy: Secondary | ICD-10-CM | POA: Insufficient documentation

## 2018-08-17 DIAGNOSIS — E119 Type 2 diabetes mellitus without complications: Secondary | ICD-10-CM

## 2018-08-17 DIAGNOSIS — Z7951 Long term (current) use of inhaled steroids: Secondary | ICD-10-CM | POA: Insufficient documentation

## 2018-08-17 DIAGNOSIS — E785 Hyperlipidemia, unspecified: Secondary | ICD-10-CM | POA: Insufficient documentation

## 2018-08-17 DIAGNOSIS — I429 Cardiomyopathy, unspecified: Secondary | ICD-10-CM | POA: Insufficient documentation

## 2018-08-17 DIAGNOSIS — I110 Hypertensive heart disease with heart failure: Secondary | ICD-10-CM | POA: Insufficient documentation

## 2018-08-17 DIAGNOSIS — Z794 Long term (current) use of insulin: Secondary | ICD-10-CM | POA: Insufficient documentation

## 2018-08-17 DIAGNOSIS — I5022 Chronic systolic (congestive) heart failure: Secondary | ICD-10-CM

## 2018-08-17 DIAGNOSIS — G4733 Obstructive sleep apnea (adult) (pediatric): Secondary | ICD-10-CM

## 2018-08-17 NOTE — Telephone Encounter (Signed)
Advair refill online with [facility]: 08/17/2018 Placed refill online with [facility] for Advair 250/50, to ship 08/28/2018, order#[id number].Delos Haring

## 2018-08-17 NOTE — Telephone Encounter (Signed)
CORRECTION TO PREVIOUS NOTE 08/17/2018  08/17/2018 Placed refill online with [facility] for Advair 250/50, to ship 08/28/2018, order# [id number].Delos Haring

## 2018-08-17 NOTE — Patient Instructions (Signed)
Continue weighing daily and call for an overnight weight gain of > 2 pounds or a weekly weight gain of >5 pounds. 

## 2018-08-27 ENCOUNTER — Other Ambulatory Visit: Payer: Self-pay | Admitting: Adult Health Nurse Practitioner

## 2018-09-09 ENCOUNTER — Telehealth: Payer: Self-pay | Admitting: Pharmacist

## 2018-09-09 NOTE — Telephone Encounter (Signed)
09/09/2018 11:50:58 AM - Lantus Solostar renewal with [employer]  09/09/2018  Patient enrollment with [employer] expires 09-24-2018, I have printed [employer] renewal application for [name] Max daily dose 22 units #2. Mailing patient his portion to sign & return, also putting provider application in [facility] for for Magadalene to sign.Delos Haring

## 2018-09-10 ENCOUNTER — Telehealth: Payer: Self-pay | Admitting: Pharmacist

## 2018-09-10 NOTE — Telephone Encounter (Signed)
09/10/2018 11:17:54 AM - Lantus Solostar pending pt to return paperwork  09/10/2018 Per Anderson Malta @ [facility] Magadalene is not coming to clinic to sign forms, only doing Virtual visits. I have changed provider to [facility] and she signed application today, holding for patient to return paperwork mailed to him 09/09/2018.Delos Haring

## 2018-09-12 ENCOUNTER — Other Ambulatory Visit: Payer: Self-pay | Admitting: Gerontology

## 2018-09-12 DIAGNOSIS — E118 Type 2 diabetes mellitus with unspecified complications: Secondary | ICD-10-CM

## 2018-09-24 ENCOUNTER — Telehealth: Payer: Self-pay | Admitting: Pharmacist

## 2018-09-24 ENCOUNTER — Telehealth: Payer: Self-pay | Admitting: Gerontology

## 2018-09-24 NOTE — Telephone Encounter (Signed)
Called pt 09/24/18 to discuss charity care application. Told pt will print out wife's income check proof and [id number] and pt's [id number] and include with packet of information to send to charity care next week.

## 2018-09-24 NOTE — Telephone Encounter (Signed)
09/24/2018 11:35:37 AM - Ventolin refill  09/24/2018 Placed refill online with [facility] for Ventolin [id number], to ship 11/02/2018, order# [id number].Delos Haring

## 2018-09-29 ENCOUNTER — Ambulatory Visit: Payer: Self-pay | Admitting: Student

## 2018-09-29 ENCOUNTER — Other Ambulatory Visit: Payer: Self-pay

## 2018-09-29 DIAGNOSIS — E118 Type 2 diabetes mellitus with unspecified complications: Secondary | ICD-10-CM

## 2018-09-29 DIAGNOSIS — I429 Cardiomyopathy, unspecified: Secondary | ICD-10-CM

## 2018-09-29 DIAGNOSIS — I10 Essential (primary) hypertension: Secondary | ICD-10-CM

## 2018-09-29 DIAGNOSIS — E782 Mixed hyperlipidemia: Secondary | ICD-10-CM

## 2018-09-29 NOTE — Progress Notes (Signed)
Follow up Diabetes/ Endocrine [facility]     Patient ID: Bradley Yaffe., male   DOB: 26-Oct-1965, 53 y.o.   MRN: [id number] Assessment:  Bradley Arpino. is a 53 y.o. male who is seen in follow up for T2DM.  Encounter Diagnoses No diagnosis found.  Assessment  Bradley Mckenzie is a 62yo man with a 2 year history of T2DM. Based on fasting glucose readings reported today, patient is getting T2DM under control. Goal to have A1c < 7, at which point the patient can reduce frequency of endocrine visits. Patient should follow up with ophthalmology as soon as able for screening of retinopathy.  Plan:    1. Continue medications as prescribed.  2. A1c in October (expected <7, which is goal)  3. If fasting glucose is <100, reduce insulin units.  4. Follow up with ophthalmology as soon as able.  5. Follow up in 3 months for diabetes management. If A1c continues to downtrend, can schedule endocrine visits every 6 months.    There are no Patient Instructions on file for this visit.   No orders of the defined types were placed in this encounter.    Subjective:  Bradley Mckenzie is a 57yo man with a 2 year history of T2DM presenting for follow-up on DM management.   Fasting glucose in 100-150, rarely 160s. Lowest was 89. A1c was 8.0 in July.  Patient is taking Victoza 1.8 mg, Lantus 20 units, Metformin 2x/day, and wasn't sure about [place].  Patient doesn't know the last time he went to the eye doctor, but thought he was supposed to go in April. 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  Within past few months notes tingling in left arm, but is not currently taking any medication. Patient does note polyuria, but is expected with Furosemide and [place]. Patient reports some diarrhea.  Patient notes decreased appetite with Victoza, which has helped sugars decrease. Patient used to walk at a local track, but has been concerned about going due to COVID-19 risk  factors.    Review of Systems  Eyes: Negative for visual disturbance.  Gastrointestinal: Positive for diarrhea.  Endocrine: Positive for polyuria.  Neurological: Positive for numbness.    Bradley Mckenzie.  has a past medical history of Cardiomyopathy ([place]), Chronic combined systolic (congestive) and diastolic (congestive) heart failure ([place]), Depression, Diabetes mellitus without complication ([place]), Hyperlipidemia, Hypertensive heart disease, Morbid obesity ([place]), and Obstructive sleep apnea.  Family History, Social History, current Medications and allergies reviewed and updated in Epic.  Objective:    There were no vitals taken for this visit. Physical Exam      Data : I have personally reviewed pertinent labs and imaging studies, if indicated,  with the patient in clinic today.   Lab Orders  No laboratory test(s) ordered today    HC Readings from Last 3 Encounters:  No data found for [facility]    Wt Readings from Last 3 Encounters:  08/17/18 286 lb 6 oz (129.9 kg)  02/03/18 280 lb 12.8 oz (127.4 kg)  01/16/18 280 lb (127 kg)

## 2018-10-28 ENCOUNTER — Ambulatory Visit
Admission: RE | Admit: 2018-10-28 | Discharge: 2018-10-28 | Disposition: A | Discharge status: Home / Self Care | Payer: Self-pay | Source: Ambulatory Visit | Attending: Internal Medicine | Admitting: Internal Medicine

## 2018-10-28 ENCOUNTER — Other Ambulatory Visit: Payer: Self-pay

## 2018-10-28 DIAGNOSIS — I110 Hypertensive heart disease with heart failure: Secondary | ICD-10-CM | POA: Insufficient documentation

## 2018-10-28 DIAGNOSIS — J449 Chronic obstructive pulmonary disease, unspecified: Secondary | ICD-10-CM | POA: Insufficient documentation

## 2018-10-28 DIAGNOSIS — E785 Hyperlipidemia, unspecified: Secondary | ICD-10-CM | POA: Insufficient documentation

## 2018-10-28 DIAGNOSIS — E119 Type 2 diabetes mellitus without complications: Secondary | ICD-10-CM | POA: Insufficient documentation

## 2018-10-28 DIAGNOSIS — I5022 Chronic systolic (congestive) heart failure: Secondary | ICD-10-CM | POA: Insufficient documentation

## 2018-10-28 NOTE — Progress Notes (Signed)
*  PRELIMINARY RESULTS* Echocardiogram 2D Echocardiogram has been performed.  Bradley Mckenzie 10/28/2018, 10:44 AM

## 2018-10-29 ENCOUNTER — Ambulatory Visit: Payer: Self-pay | Admitting: Pharmacist

## 2018-10-29 DIAGNOSIS — Z79899 Other long term (current) drug therapy: Secondary | ICD-10-CM

## 2018-10-29 NOTE — Progress Notes (Cosign Needed)
Medication Management Clinic Visit Note  Patient: Bradley Mckenzie. MRN: [id number] Date of Birth: [birth year] PCP: Patient, No Pcp Per   Bradley Mckenzie. 53 y.o. male presents for a medication therapy management visit today.  There were no vitals taken for this visit.  Patient Information   Past Medical History:  Diagnosis Date  . Cardiomyopathy (Bradley Mckenzie)    a. 01/2014 Echo: EF 25-30%;  b. ? ischemic vs non-ischemic.  He's never had an ischemic eval.  . Chronic combined systolic (congestive) and diastolic (congestive) heart failure (Bradley Mckenzie)    a. 01/2014 Echo: EF 25-30%. b. Echo 03/2015: Improved EF of 40-45%  . Depression   . Diabetes mellitus without complication (Bradley Mckenzie)    a. Not previously on outpt meds.  . Hyperlipidemia   . Hypertensive heart disease    a. Since his 90's.  . Morbid obesity (Bradley Mckenzie)   . Obstructive sleep apnea    a. Has not used CPAP since ~ 2012.      Past Surgical History:  Procedure Laterality Date  . HERNIA REPAIR  2004     Family History  Problem Relation Age of Onset  . Heart disease Mother        alive & well.  Bradley Mckenzie Kitchen Heart attack Father 23       died @ 68 of cancer.  . Diabetes Father   . Cancer Father        "in his stomach"  . Cancer Sister   . Cancer Paternal Uncle    New Diagnoses (since last visit): none  Family Support: Good  Lifestyle Diet: Breakfast: poptart, cereal Lunch: sandwich and chips or slices of bread alone Dinner: fast food (multiple days) Patient doesn't like to cook Drinks: water (flavor packs), soda (regular) Patient loves fruit (apples, grapes) limits veggies       Social History   Substance and Sexual Activity  Alcohol Use Yes  . Alcohol/week: 0.0 standard drinks   Comment: occassional alcohol use at special events    Social History   Tobacco Use  Smoking Status Former Smoker  Smokeless Tobacco Never Used  Tobacco Comment   Quit in his 20's.     Health Maintenance  Topic Date Due  . PNEUMOCOCCAL  POLYSACCHARIDE VACCINE AGE 66-64 HIGH RISK  12/26/1968  . FOOT EXAM  12/26/1976  . OPHTHALMOLOGY EXAM  12/26/1976  . HIV Screening  12/26/1981  . TETANUS/TDAP  12/26/1985  . COLONOSCOPY  12/26/2016  . INFLUENZA VACCINE  08/15/2018  . HEMOGLOBIN A1C  01/23/2019   Health Maintenance/Date Completed Last ED visit: 2019 Last Visit to PCP:  Next Visit to PCP: no aware or scheduled Specialist Visit: Echocardiogram 10/18/2018 Dental Exam: no Eye Exam: 01/2018 Prostate Exam: no DEXA: no Colonoscopy: no Flu Vaccine: not planning to receive this year Pneumonia Vaccine: unknown  Outpatient Encounter Medications as of 10/29/2018  Medication Sig  . albuterol (VENTOLIN HFA) 108 (90 Base) MCG/ACT inhaler Inhale 2 puffs into the lungs every 6 (six) hours as needed for wheezing or shortness of breath.  . ASPIRIN LOW DOSE 81 MG EC tablet TAKE ONE TABLET BY MOUTH EVERY DAY  . atorvastatin (LIPITOR) 10 MG tablet TAKE ONE TABLET BY MOUTH EVERY DAY  . carvedilol (COREG) 12.5 MG tablet Take 12.5 mg by mouth 2 (two) times daily with a meal.  . cetirizine (ZYRTEC) 10 MG tablet Take 1 tablet (10 mg total) by mouth daily.  . dapagliflozin propanediol (FARXIGA) 10 MG TABS tablet Take 10 mg by mouth daily.  Bradley Mckenzie Kitchen  Fluticasone-Salmeterol (ADVAIR) 250-50 MCG/DOSE AEPB Inhale 1 puff into the lungs 2 (two) times daily.  . furosemide (LASIX) 40 MG tablet Take 1 tablet (40 mg total) by mouth 2 (two) times daily.  . hydrALAZINE (APRESOLINE) 25 MG tablet TAKE ONE TABLET BY MOUTH 2 TIMES A DAY  . ibuprofen (ADVIL) 200 MG tablet Take 200 mg by mouth every 6 (six) hours as needed.  . insulin glargine (LANTUS) 100 UNIT/ML injection Inject 0.2 mLs (20 Units total) into the skin daily. Decrease by 5 units every three days is AM sugars less than 100. Increase by 2 units every three days if AM sugars more than 150  . ipratropium-albuterol (DUONEB) 0.5-2.5 (3) MG/3ML SOLN Take 3 mLs by nebulization every 4 (four) hours as needed.  .  liraglutide (VICTOZA) 18 MG/3ML SOPN Inject 0.3 mLs (1.8 mg total) into the skin 1 day or 1 dose for 1 dose.  . losartan (COZAAR) 100 MG tablet Take 1 tablet (100 mg total) by mouth daily.  . metFORMIN (GLUCOPHAGE) 500 MG tablet Take 1 tablet (500 mg total) by mouth 2 (two) times daily with a meal.  . potassium chloride SA (K-DUR) 20 MEQ tablet Take 1 tablet (20 mEq total) by mouth 2 (two) times daily.  Bradley Mckenzie Kitchen spironolactone (ALDACTONE) 25 MG tablet TAKE ONE TABLET BY MOUTH EVERY DAY  . tadalafil (CIALIS) 20 MG tablet Take 1 tablet (20 mg total) by mouth daily as needed for erectile dysfunction.   No facility-administered encounter medications on file as of 10/29/2018.     Assessment: Diabetes: farxiga, lantus, metformin, victoza Home reading of blood sugar range from 96-149 mg/dL. Patient denies low blood sugars. Patients diet consists of a large amount of fried foods, sugar, carbohydrates, etc. Patient denied side effects associated with medications.  Heart Failure:Carvedilol, furosemide, hydralazine, aspirin, losartan, spironolactone Patient denies symptoms or worsening heart failure. Patient stated exercise and walking is limited due to shortness of breath.   Asthma/COPD: advair and ventolin Patient stated he rarely needs the ventolin inhaler. Patient has no complaints on current treatment regimen.   Hyperlipidemia: atorvastatin Patient denies any side effects associated with medication.  Adherence: Refills are late according to last refills dates. Patient stated he uses pill boxes and thinks he doesn't miss any doses. Patient asked for refills during the MTM encounter.    Plan: Diabetes: -Check blood sugar at least twice a day -Encouraged to do as much exercise as his body will allow without putting self at harm due to shortness of breath. -Diabetic diet -Encouraged medication adherence -Educated on proper ways to correct a low blood sugar -Avoid fast food and foods with increased  amount of sugar  Heart Failure: -Continue medications -Avoid fast food -Seek medical attention if you experience chest discomfort or signs and symptoms of a heart attack.   Asthma/COPD: -Notify doctor of shortness of breath or wheezing worsens.  -Encouraged patient to carry rescue inhaler places with him.  Hyperlipidemia: -Continue medications  -Medications refills upon patients request 10/29/2018  Chaney Born, PharmD [facility]

## 2018-10-30 ENCOUNTER — Other Ambulatory Visit: Payer: Self-pay

## 2018-11-03 ENCOUNTER — Telehealth: Payer: Self-pay | Admitting: Pharmacist

## 2018-11-03 NOTE — Telephone Encounter (Signed)
11/03/2018 9:19:24 AM - Lantus Solostar  11/03/2018 I have returned from vacation and have a note from [place] Medical illustrator) that patient stated he needs Lantus. I had mailed patient paperwork 09/09/2018 to sign & return--also this chart was on the cart, I had a copy of what I mailed to patient 09/09/2018, and someone has gotten him to sign 11/02/2018. The provider signature is almost 69 days old, Benjamine Mola will be in our office on 11/05/2018 I will get her to sign and fax to [facility].Delos Haring

## 2018-11-04 ENCOUNTER — Telehealth: Payer: Self-pay | Admitting: Pharmacist

## 2018-11-04 NOTE — Telephone Encounter (Signed)
11/04/2018 1:46:14 PM - Advair 250/50 refill online with [facility]  11/04/2018 Placed refill online with [facility] for Advair 250/50, they will ship 11/20/2018, order# [id number].Bradley Mckenzie

## 2018-11-04 NOTE — Telephone Encounter (Signed)
11/04/2018 9:18:03 AM - Victoza & tips refill to [facility]  11/04/2018 printed Novo Nordisk refill request & scripts for Victoza Inject 1.8mg  once daily #4 & Novofine 32G tips #2--in [facility] folder for Benjamine Mola to sign.Delos Haring

## 2018-11-09 ENCOUNTER — Telehealth: Payer: Self-pay | Admitting: Pharmacist

## 2018-11-09 NOTE — Telephone Encounter (Signed)
11/09/2018 11:40:50 AM - Lantus Solostar renewal to [facility]  11/09/2018 Faxed [employer] renewal for [name] Max daily dose 22 units #2 boxes.AJ   11/09/2018 11:39:31 AM - Victoza & tips refill to [facility]  11/09/2018 [facility] refill for [employer] 1.8mg  once daily #4 boxes & tips #2 boxes.Delos Haring

## 2018-11-23 ENCOUNTER — Telehealth: Payer: Self-pay | Admitting: Pharmacist

## 2018-11-23 NOTE — Telephone Encounter (Signed)
11/23/2018 10:13:54 AM - [name] pending  11/23/2018 I have received the signed portion of [place] application for Farxiga back from [facility], NP--holding for patient to return his portion, mailed to patient 11/17/2018.Delos Haring

## 2018-12-22 ENCOUNTER — Telehealth: Payer: Self-pay | Admitting: Pharmacist

## 2018-12-22 NOTE — Telephone Encounter (Signed)
12/22/2018 3:57:54 PM - Lantus Solostar refill to [facility]  12/22/2018 Sending [employer] refill request to [facility] for Lantus Solostar Max daily dose 22 units every morning #2.Delos Haring

## 2018-12-23 ENCOUNTER — Telehealth: Payer: Self-pay | Admitting: Pharmacist

## 2018-12-23 NOTE — Telephone Encounter (Signed)
12/23/2018 3:31:58 PM - Ventolin refill online with [facility]  12/23/2018 Placed refill online with [facility] for Ventolin, to ship 01/05/2019, order# [id number].Delos Haring

## 2018-12-29 ENCOUNTER — Other Ambulatory Visit: Payer: Self-pay | Admitting: Gerontology

## 2018-12-29 ENCOUNTER — Ambulatory Visit: Payer: Self-pay | Admitting: Endocrinology

## 2018-12-29 ENCOUNTER — Other Ambulatory Visit: Payer: Self-pay

## 2018-12-29 DIAGNOSIS — E118 Type 2 diabetes mellitus with unspecified complications: Secondary | ICD-10-CM

## 2018-12-29 LAB — POCT GLYCOSYLATED HEMOGLOBIN (HGB A1C): Hemoglobin A1C: 8.5 % — AB (ref 4.0–5.6)

## 2018-12-29 NOTE — Progress Notes (Signed)
Follow up Diabetes/ Endocrine [facility]     Patient ID: Bradley Mckenzie., male   DOB: [birth year], 53 y.o.   MRN: [id number] Assessment:  Bradley Mckenzie. is a 53 y.o. male who is seen in follow up for Type 2 diabetes mellitus with complication (Bradley Mckenzie) [id number] at the request of [place], Chioma E, NP.  Encounter Diagnoses 1. Type 2 diabetes mellitus with complication (HCC)     Plan/Assessment  1. T2DM  Patient currently takes Lantus 20u daily, Victoza 1.8u daily, metformin 500 mg BID. Morning home bg readings ranging from 107-120s, suggesting that blood glucose is well controlled. No reports of hypoglycemic episodes. Will have occasional upset stomach, with loose stools. Concerned about increased appetite that previously been suppressed with Victoza intake. A1c 8.5 on 12/29/2018 (compared to 8.5 on 07/23/2018). Microalbumin normal on 07/16/2018 - Order UACR (normal in 12/12/2015) - Microalbumin due 07/2018 - HbA1c due 03/2018 - Refer to nutritionist for diet modifications   - Add dietary instructions in visit summary, which can be printed for patient on next visit    Patient Instructions  Increase Lantus to 22units daily. Lantus is better when taken at nighttime. Continue to check your blood sugar levels in the morning.     Orders Placed This Encounter  Procedures  . POCT HgB A1C     Subjective:  Bradley Mckenzie is a 53 yo with PMHx of T2DM, COPD, asthma, HTN, CVD who presents to clinic for f/u of diabetes. Current medication includes: Lantus 20u daily, Victoza 1.8u daily, metformin 500 mg BID. Home bg readings: 107-120 in the morning regularly. Denies wanting to pass out, shakes/chills, chest pain. Has occasional nocturia (every 2 hours) in the setting of diuretic use. Describes tingling sensation in the hands, but states these symptoms occur when placing hands on the armrest or sleeping in a certain position (with hands on torso sleeping on the back). With PMHx of COPD, asthma, notes that  breathing problems have worsened  Side effects: Victoza helped with the appetite, but lately appetite has been getting worse (increased hunger to where it was prior to starting Victoza), occasional upset stomach with loose stools. Recognizes need of more vegetable intake in diet, eats fried food. For beverages: Cut back on sodas (used to drink all the time), does drink soda and orange juice, mostly water with flavor packs  Exercise includes occasional 10-15 minutes of treadmill, but will stop exercise after that period of time because of being tired and SOB   ( ) Lantus/Victoza: Switch taking in the morning (with wife schedule)  ( ) Victoza and appetite     Review of Systems  [name]  has a past medical history of Cardiomyopathy ([place]), Chronic combined systolic (congestive) and diastolic (congestive) heart failure ([place]), Depression, Diabetes mellitus without complication ([place]), Hyperlipidemia, Hypertensive heart disease, Morbid obesity ([place]), and Obstructive sleep apnea.  Family History, Social History, current Medications and allergies reviewed and updated in Epic.  Objective:    There were no vitals taken for this visit. Physical Exam      Data : I have personally reviewed pertinent labs and imaging studies, if indicated,  with the patient in clinic today.    Lab Orders     POCT HgB A1C  HC Readings from Last 3 Encounters:  No data found for [facility]    Wt Readings from Last 3 Encounters:  08/17/18 286 lb 6 oz (129.9 kg)  02/03/18 280 lb 12.8 oz (127.4 kg)  01/16/18 280 lb (  127 kg)

## 2018-12-29 NOTE — Patient Instructions (Addendum)
Increase Lantus to 22units daily. Lantus is better when taken at nighttime. Continue to check your blood sugar levels in the morning.    The american diabetes association recommends the following 3guidelines for an improved diet.  Remember to use the common sense and apply moderation to these .   1. Choose whole foods over highly processed/packaged foods  2.have more non-starchy vegetables ( eg  broccoli [place] sprouts/ zucchini) and less starchy vegetables (eg corn , peas, potatoes, beans) -   but like we discussed on the phone beans & corn might still be better than other food choices you could be making , and are typically better than packaged foods.  Therefore in addition to using our guidelines, remember to use common sense and to be kind to yourself.  3. reduce added sugars & refined grains ( e.g., soda, juice, white bread, white rice, pasta, white flour)    PLATE METHOD 1. Fill half your plate with nonstarchy vegetables. 2. Fill one quarter of your plate with lean protein foods.  3. Fill one quarter of your plate with carbohydrate foods     For more information go to this site :  [email or web address]

## 2019-01-05 ENCOUNTER — Ambulatory Visit: Payer: Self-pay

## 2019-01-14 ENCOUNTER — Telehealth: Payer: Self-pay | Admitting: Pharmacist

## 2019-01-14 NOTE — Telephone Encounter (Signed)
2/31/2020 10:44:41 AM - [name] pending  01/14/2019 Patient in office today inquiring on Farxiga--I had mailed paperwork to patient 11/23/2018 to sign and return--patient states he did not receive, I did verify his address. Patient stated he did not understand why he is told paperwork is mailed to him by [place] and [facility] and he does not receive. Also I explained that the income we have for his wife is from July (5 months old) and I need more current, also asked if they had filed taxes-he never answer the taxes question. He did state his wife is no longer working she is receiving SSI-I explained that we would need a copy of the SSI information and if they filed taxes I need a copy if not gave patient the [id number] for he and wife to sign. Patient will need to return this information before I submit the application to [place] for [place]. I will also copy this note to [facility] for [place] to see. Patient asked for this medication today, Velva Harman checked we do not have any samples, I advised patient to contact Tina's office for any samples, he stated he would try calling but they probably would not be in the office today. Delos Haring

## 2019-01-18 ENCOUNTER — Telehealth: Payer: Self-pay | Admitting: Pharmacist

## 2019-01-18 NOTE — Telephone Encounter (Signed)
01/18/2019 1:50:00 PM - Advair refill online with [facility]  01/18/2019 Placed refill online with [facility] for Advair 250/50, to ship 01/29/2019, order# [id number].Bradley Mckenzie

## 2019-01-18 NOTE — Telephone Encounter (Signed)
01/18/2019 11:19:02 AM - Lantus Solostar refill to [facility]  01/18/2019 I have faxed [employer]i refill request for Lantus Solostar Max daily dos 22 units # 2.Delos Haring

## 2019-01-28 ENCOUNTER — Ambulatory Visit: Payer: Self-pay

## 2019-02-10 ENCOUNTER — Telehealth: Payer: Self-pay | Admitting: Pharmacist

## 2019-02-10 NOTE — Telephone Encounter (Signed)
02/10/2019 3:24:07 PM - [name] faxed to [facility]  02/10/2019 I have faxed [place] application for Farxiga 55mng 1 daily with the copies of income provided to [place].Delos Haring

## 2019-02-11 ENCOUNTER — Other Ambulatory Visit: Payer: Self-pay | Admitting: Family

## 2019-02-13 NOTE — Progress Notes (Signed)
Patient ID: Bradley Staib., male    DOB: 07/02/1965, 54 y.o.   MRN: [id number]  HPI  Bradley Mckenzie is a 54 y/o male who has a history of diabetes, HTN, obstructive sleep apnea, depression, hyperlipidemia, morbid obesity and heart failure with a reduced ejection fraction. Remote tobacco exposure.   Echo report from 10/28/2018 reviewed and showed an EF of 60-65% along with mildly elevated PA pressure. Last echo was done 09/02/15 with an EF of 30-35%, mild mitral regurgitation and no aortic stenosis. EF has declined from 40-45% in March 2017 but he had been inconsistent taking his medications & not using his CPAP during this time.  Has not been admitted or been in the ED in the last 6 months.   Returns today for a follow-up visit with a chief complaint of minimal shortness of breath upon moderate exertion. He describes this as chronic in nature having been present for several years. He has associated wheezing (at times), cough, fatigue, head congestion and slight weight gain along with this. He denies any dizziness, abdominal distention, palpitations, pedal edema or chest pain.   Has been out of farxiga for ~ the last 2 weeks and has noticed his glucose rising. [facility]  Past Medical History:  Diagnosis Date  . Cardiomyopathy ([place])    a. 01/2014 Echo: EF 25-30%;  b. ? ischemic vs non-ischemic.  He's never had an ischemic eval.  . Chronic combined systolic (congestive) and diastolic (congestive) heart failure ([place])    a. 01/2014 Echo: EF 25-30%. b. Echo 03/2015: Improved EF of 40-45%  . Depression   . Diabetes mellitus without complication ([place])    a. Not previously on outpt meds.  . Hyperlipidemia   . Hypertensive heart disease    a. Since his 79's.  . Morbid obesity ([place])   . Obstructive sleep apnea    a. Has not used CPAP since ~ 2012.   Past Surgical History:  Procedure Laterality Date  . HERNIA REPAIR  2004   Family History  Problem  Relation Age of Onset  . Heart disease Mother        alive & well.  [name] Heart attack Father 60       died @ 21 of cancer.  . Diabetes Father   . Cancer Father        "in his stomach"  . Cancer Sister   . Cancer Paternal Uncle    Social History   Tobacco Use  . Smoking status: Former Research scientist (life sciences)  . Smokeless tobacco: Never Used  . Tobacco comment: Quit in his 54's.  Substance Use Topics  . Alcohol use: Yes    Alcohol/week: 0.0 standard drinks    Comment: occassional alcohol use at special events   Prior to Admission medications   Medication Sig Start Date End Date Taking? Authorizing Provider  albuterol (VENTOLIN HFA) 108 (90 Base) MCG/ACT inhaler Inhale 2 puffs into the lungs every 6 (six) hours as needed for wheezing or shortness of breath. 06/04/18  Yes McGowan, Shannon A, PA-C  ASPIRIN LOW DOSE 81 MG EC tablet TAKE ONE TABLET BY MOUTH EVERY DAY 06/04/18  Yes Darylene Price A, FNP  atorvastatin (LIPITOR) 10 MG tablet TAKE ONE TABLET BY MOUTH EVERY DAY 01/20/18  Yes Anup Brigham A, FNP  carvedilol (COREG) 12.5 MG tablet TAKE ONE TABLET BY MOUTH 2 TIMES A DAY WITH A MEAL. 02/11/19  Yes Darylene Price A, FNP  cetirizine (ZYRTEC) 10 MG tablet  Take 1 tablet (10 mg total) by mouth daily. 05/18/18  Yes Niki Payment A, FNP  Fluticasone-Salmeterol (ADVAIR) 250-50 MCG/DOSE AEPB Inhale 1 puff into the lungs 2 (two) times daily. 04/03/17  Yes Doles-Johnson, Teah, NP  furosemide (LASIX) 40 MG tablet Take 1 tablet (40 mg total) by mouth 2 (two) times daily. 07/16/18 02/15/19 Yes Darylene Price A, FNP  hydrALAZINE (APRESOLINE) 25 MG tablet TAKE ONE TABLET BY MOUTH 2 TIMES A DAY 02/11/19  Yes Darylene Price A, FNP  ibuprofen (ADVIL) 200 MG tablet Take 200 mg by mouth every 6 (six) hours as needed.   Yes [provider]  insulin glargine (LANTUS) 100 UNIT/ML injection Inject 0.2 mLs (20 Units total) into the skin daily. Decrease by 5 units every three days is AM sugars less than 100. Increase by 2 units every  three days if AM sugars more than 150 Patient taking differently: Inject 22 Units into the skin daily. Decrease by 5 units every three days is AM sugars less than 100. Increase by 2 units every three days if AM sugars more than 150 06/30/18  Yes Buse, John B, MD  ipratropium-albuterol (DUONEB) 0.5-2.5 (3) MG/3ML SOLN Take 3 mLs by nebulization every 4 (four) hours as needed.   Yes [provider]  liraglutide (VICTOZA) 18 MG/3ML SOPN Inject 0.3 mLs (1.8 mg total) into the skin 1 day or 1 dose for 1 dose. 06/30/18 02/15/19 Yes Buse, Hewitt Blade, MD  losartan (COZAAR) 100 MG tablet Take 1 tablet (100 mg total) by mouth daily. 05/18/18  Yes Darylene Price A, FNP  metFORMIN (GLUCOPHAGE) 500 MG tablet Take 1 tablet (500 mg total) by mouth 2 (two) times daily with a meal. 06/30/18  Yes Buse, Hewitt Blade, MD  potassium chloride SA (K-DUR) 20 MEQ tablet Take 1 tablet (20 mEq total) by mouth 2 (two) times daily. 07/16/18  Yes Darylene Price A, FNP  spironolactone (ALDACTONE) 25 MG tablet TAKE ONE TABLET BY MOUTH EVERY DAY 06/04/18  Yes Darylene Price A, FNP  tadalafil (CIALIS) 20 MG tablet Take 1 tablet (20 mg total) by mouth daily as needed for erectile dysfunction. 08/06/17  Yes McGowan, Larene Beach A, PA-C  dapagliflozin propanediol (FARXIGA) 10 MG TABS tablet Take 10 mg by mouth daily. Patient not taking: Reported on 02/15/2019 07/16/18   Alisa Graff, FNP    Review of Systems  Constitutional: Positive for fatigue. Negative for appetite change.  HENT: Positive for congestion and postnasal drip. Negative for sore throat.   Eyes: Negative.   Respiratory: Positive for cough (at times), shortness of breath (at times) and wheezing (at times). Negative for chest tightness.   Cardiovascular: Negative for chest pain, palpitations and leg swelling.  Gastrointestinal: Negative for abdominal distention and abdominal pain.       Abdominal cramping at times  Endocrine: Negative.   Genitourinary: Negative.   Musculoskeletal:  Negative for back pain and neck pain.  Skin: Negative.   Allergic/Immunologic: Negative.   Neurological: Negative for dizziness and light-headedness.  Hematological: Negative for adenopathy. Does not bruise/bleed easily.  Psychiatric/Behavioral: Negative for dysphoric mood, sleep disturbance and suicidal ideas. The patient is not nervous/anxious.    Vitals:   02/15/19 1043  BP: 107/65  Pulse: (!) 107  Resp: 18  SpO2: 95%  Weight: 293 lb 9.6 oz (133.2 kg)  Height: 5\' 9"  (1.753 m)   Wt Readings from Last 3 Encounters:  02/15/19 293 lb 9.6 oz (133.2 kg)  08/17/18 286 lb 6 oz (129.9 kg)  02/03/18 280 lb 12.8 oz (127.4 kg)   Lab Results  Component Value Date   CREATININE 1.17 07/23/2018   CREATININE 1.33 (H) 11/11/2017   CREATININE 1.25 08/05/2017    Physical Exam  Constitutional: He is oriented to person, place, and time. He appears well-developed and well-nourished.  HENT:  Head: Normocephalic and atraumatic.  Neck: No JVD present.  Cardiovascular: Normal rate and regular rhythm.  Pulmonary/Chest: Effort normal. He has wheezes (few in bilateral lower lobes). He has no rales.  Abdominal: Soft. He exhibits no distension. There is no abdominal tenderness.  Musculoskeletal:        General: No tenderness or edema.     Cervical back: Normal range of motion and neck supple.  Neurological: He is alert and oriented to person, place, and time.  Skin: Skin is warm and dry.  Psychiatric: He has a normal mood and affect. His behavior is normal.  Nursing note and vitals reviewed.   Assessment & Plan:  1: Chronic heart failure with now preserved ejection fraction-  - NYHA Class II - euvolemic today - weighing daily and he was reminded to call for an overnight weight gain of >2 pounds or a weekly weight gain of >5 pounds - weight up 7 pounds since last here 6 months ago - trying to eat low sodium foods when he can but says it's difficult at times due to finances - drinking 64 ounces  of water daily - does not take the flu vaccine; good handwashing encouraged - 2 weeks of samples of farxiga 10mg  given; he is going to medication management clinic today  2: HTN- - BP looks good today - went to [facility] on 12/29/2018 - BMP on 07/23/2018 reviewed and shows sodium 140, potassium 3.8, creatinine 1.17 and GFR 83  3: Obstructive sleep apnea- - wearing CPAP nightly and says that he's gotten it adjusted better and has a tighter fit - says that he's sleeping well  4: Diabetes- - fasting glucose this morning at home was 202 - he's noticed it rising since he's been out of farxiga; samples given per above  - A1c on 07/23/2018 was 8.5%  Patient did not bring his medications nor a list. Each medication was verbally reviewed with the patient and he was encouraged to bring the bottles to every visit to confirm accuracy of list.  Return in 6 months or sooner for any questions/problems before then.

## 2019-02-15 ENCOUNTER — Ambulatory Visit: Payer: Self-pay | Attending: Family | Admitting: Family

## 2019-02-15 ENCOUNTER — Encounter: Payer: Self-pay | Admitting: Family

## 2019-02-15 ENCOUNTER — Other Ambulatory Visit: Payer: Self-pay

## 2019-02-15 VITALS — BP 107/65 | HR 107 | Resp 18 | Ht 69.0 in | Wt 293.6 lb

## 2019-02-15 DIAGNOSIS — E785 Hyperlipidemia, unspecified: Secondary | ICD-10-CM | POA: Insufficient documentation

## 2019-02-15 DIAGNOSIS — I110 Hypertensive heart disease with heart failure: Secondary | ICD-10-CM | POA: Insufficient documentation

## 2019-02-15 DIAGNOSIS — R062 Wheezing: Secondary | ICD-10-CM | POA: Insufficient documentation

## 2019-02-15 DIAGNOSIS — R0602 Shortness of breath: Secondary | ICD-10-CM | POA: Insufficient documentation

## 2019-02-15 DIAGNOSIS — G4733 Obstructive sleep apnea (adult) (pediatric): Secondary | ICD-10-CM

## 2019-02-15 DIAGNOSIS — I10 Essential (primary) hypertension: Secondary | ICD-10-CM

## 2019-02-15 DIAGNOSIS — R05 Cough: Secondary | ICD-10-CM | POA: Insufficient documentation

## 2019-02-15 DIAGNOSIS — R5383 Other fatigue: Secondary | ICD-10-CM | POA: Insufficient documentation

## 2019-02-15 DIAGNOSIS — Z79899 Other long term (current) drug therapy: Secondary | ICD-10-CM | POA: Insufficient documentation

## 2019-02-15 DIAGNOSIS — I5032 Chronic diastolic (congestive) heart failure: Secondary | ICD-10-CM

## 2019-02-15 DIAGNOSIS — I429 Cardiomyopathy, unspecified: Secondary | ICD-10-CM | POA: Insufficient documentation

## 2019-02-15 DIAGNOSIS — Z794 Long term (current) use of insulin: Secondary | ICD-10-CM | POA: Insufficient documentation

## 2019-02-15 DIAGNOSIS — E119 Type 2 diabetes mellitus without complications: Secondary | ICD-10-CM

## 2019-02-15 DIAGNOSIS — I5042 Chronic combined systolic (congestive) and diastolic (congestive) heart failure: Secondary | ICD-10-CM | POA: Insufficient documentation

## 2019-02-15 DIAGNOSIS — Z87891 Personal history of nicotine dependence: Secondary | ICD-10-CM | POA: Insufficient documentation

## 2019-02-15 DIAGNOSIS — Z8249 Family history of ischemic heart disease and other diseases of the circulatory system: Secondary | ICD-10-CM | POA: Insufficient documentation

## 2019-02-15 NOTE — Patient Instructions (Signed)
Continue weighing daily and call for an overnight weight gain of > 2 pounds or a weekly weight gain of >5 pounds. 

## 2019-02-16 ENCOUNTER — Telehealth: Payer: Self-pay | Admitting: Pharmacist

## 2019-02-16 NOTE — Telephone Encounter (Signed)
02/16/2019 2:05:06 PM - Victoza & tips refill to provider  02/16/2019 Sending [place] refill request to [facility] for Victoza Inject 1.8mg  once daily #4 boxes & Novofine 32G tips #2 boxes.Delos Haring

## 2019-02-18 ENCOUNTER — Telehealth: Payer: Self-pay | Admitting: Pharmacist

## 2019-02-18 NOTE — Telephone Encounter (Signed)
02/18/2019 9:55:31 AM - Victoza & tips refill to [facility]  02/18/2019 [facility] refill request for Victoza Inject 1.8 mg once daily #4boxes, & Novofine 32G tips # 2boxes.Bradley Mckenzie

## 2019-02-25 ENCOUNTER — Other Ambulatory Visit: Payer: Self-pay | Admitting: Gerontology

## 2019-02-25 DIAGNOSIS — E119 Type 2 diabetes mellitus without complications: Secondary | ICD-10-CM

## 2019-03-12 ENCOUNTER — Encounter: Payer: Self-pay | Admitting: Family

## 2019-03-12 ENCOUNTER — Other Ambulatory Visit: Payer: Self-pay

## 2019-03-12 ENCOUNTER — Ambulatory Visit: Payer: Self-pay | Attending: Family | Admitting: Family

## 2019-03-12 VITALS — BP 128/76 | HR 90 | Resp 20 | Ht 69.0 in | Wt 294.0 lb

## 2019-03-12 DIAGNOSIS — Z7982 Long term (current) use of aspirin: Secondary | ICD-10-CM | POA: Insufficient documentation

## 2019-03-12 DIAGNOSIS — E119 Type 2 diabetes mellitus without complications: Secondary | ICD-10-CM | POA: Insufficient documentation

## 2019-03-12 DIAGNOSIS — Z79899 Other long term (current) drug therapy: Secondary | ICD-10-CM | POA: Insufficient documentation

## 2019-03-12 DIAGNOSIS — Z87891 Personal history of nicotine dependence: Secondary | ICD-10-CM | POA: Insufficient documentation

## 2019-03-12 DIAGNOSIS — Z8249 Family history of ischemic heart disease and other diseases of the circulatory system: Secondary | ICD-10-CM | POA: Insufficient documentation

## 2019-03-12 DIAGNOSIS — Z833 Family history of diabetes mellitus: Secondary | ICD-10-CM | POA: Insufficient documentation

## 2019-03-12 DIAGNOSIS — R222 Localized swelling, mass and lump, trunk: Secondary | ICD-10-CM

## 2019-03-12 DIAGNOSIS — Z7951 Long term (current) use of inhaled steroids: Secondary | ICD-10-CM | POA: Insufficient documentation

## 2019-03-12 DIAGNOSIS — I5042 Chronic combined systolic (congestive) and diastolic (congestive) heart failure: Secondary | ICD-10-CM | POA: Insufficient documentation

## 2019-03-12 DIAGNOSIS — I110 Hypertensive heart disease with heart failure: Secondary | ICD-10-CM | POA: Insufficient documentation

## 2019-03-12 DIAGNOSIS — I429 Cardiomyopathy, unspecified: Secondary | ICD-10-CM | POA: Insufficient documentation

## 2019-03-12 DIAGNOSIS — I10 Essential (primary) hypertension: Secondary | ICD-10-CM

## 2019-03-12 DIAGNOSIS — G4733 Obstructive sleep apnea (adult) (pediatric): Secondary | ICD-10-CM | POA: Insufficient documentation

## 2019-03-12 DIAGNOSIS — Z794 Long term (current) use of insulin: Secondary | ICD-10-CM | POA: Insufficient documentation

## 2019-03-12 DIAGNOSIS — E785 Hyperlipidemia, unspecified: Secondary | ICD-10-CM | POA: Insufficient documentation

## 2019-03-12 DIAGNOSIS — I5032 Chronic diastolic (congestive) heart failure: Secondary | ICD-10-CM

## 2019-03-12 LAB — GLUCOSE, CAPILLARY: Glucose-Capillary: 181 mg/dL — ABNORMAL HIGH (ref 70–99)

## 2019-03-12 MED ORDER — TORSEMIDE 20 MG PO TABS: 40.0000 mg | ORAL_TABLET | Freq: Two times a day (BID) | ORAL | 3 refills | Status: DC

## 2019-03-12 NOTE — Progress Notes (Signed)
Patient ID: Bradley Subasic., male    DOB: January 17, 1965, 54 y.o.   MRN: [id number]  HPI  Bradley Mckenzie is a 54 y/o male who has a history of diabetes, HTN, obstructive sleep apnea, depression, hyperlipidemia, morbid obesity and heart failure with a reduced ejection fraction. Remote tobacco exposure.   Echo report from 10/28/2018 reviewed and showed an EF of 60-65% along with mildly elevated PA pressure. Last echo was done 09/02/15 with an EF of 30-35%, mild mitral regurgitation and no aortic stenosis. EF has declined from 40-45% in March 2017 but he had been inconsistent taking his medications & not using his CPAP during this time.  Has not been admitted or been in the ED in the last 6 months.   Returns today for an acute visit with a chief complaint of worsening shortness of breath over the last couple of weeks. He has associated fatigue, cough, wheezing, pedal edema, abdominal distention and dizziness along with this. He denies any difficulty sleeping, palpitations, chest pain or weight gain.   He says that he's waiting on his farxiga from medication management clinic and has been out of it for ~ 2 weeks and during this time, his glucose levels have risen some. He also has noticed some tenderness/ swelling over his right upper chest wall/ right axilla area and he's unsure what that could be related to.   No change in his diet.   Past Medical History:  Diagnosis Date  . Cardiomyopathy ([place])    a. 01/2014 Echo: EF 25-30%;  b. ? ischemic vs non-ischemic.  He's never had an ischemic eval.  . Chronic combined systolic (congestive) and diastolic (congestive) heart failure ([place])    a. 01/2014 Echo: EF 25-30%. b. Echo 03/2015: Improved EF of 40-45%  . Depression   . Diabetes mellitus without complication ([place])    a. Not previously on outpt meds.  . Hyperlipidemia   . Hypertensive heart disease    a. Since his 35's.  . Morbid obesity ([place])   . Obstructive sleep apnea    a. Has not used CPAP since ~  2012.   Past Surgical History:  Procedure Laterality Date  . HERNIA REPAIR  2004   Family History  Problem Relation Age of Onset  . Heart disease Mother        alive & well.  [name] Heart attack Father 47       died @ 64 of cancer.  . Diabetes Father   . Cancer Father        "in his stomach"  . Cancer Sister   . Cancer Paternal Uncle    Social History   Tobacco Use  . Smoking status: Former Research scientist (life sciences)  . Smokeless tobacco: Never Used  . Tobacco comment: Quit in his 17's.  Substance Use Topics  . Alcohol use: Yes    Alcohol/week: 0.0 standard drinks    Comment: occassional alcohol use at special events   Prior to Admission medications   Medication Sig Start Date End Date Taking? Authorizing Provider  albuterol (VENTOLIN HFA) 108 (90 Base) MCG/ACT inhaler Inhale 2 puffs into the lungs every 6 (six) hours as needed for wheezing or shortness of breath. 06/04/18  Yes McGowan, Shannon A, PA-C  ASPIRIN LOW DOSE 81 MG EC tablet TAKE ONE TABLET BY MOUTH EVERY DAY 06/04/18  Yes Darylene Price A, FNP  atorvastatin (LIPITOR) 10 MG tablet TAKE ONE TABLET BY MOUTH EVERY DAY 01/20/18  Yes Darylene Price A, FNP  carvedilol (COREG) 12.5 MG  tablet TAKE ONE TABLET BY MOUTH 2 TIMES A DAY WITH A MEAL. 02/11/19  Yes Yizel Canby, Otila Kluver A, FNP  cetirizine (ZYRTEC) 10 MG tablet Take 1 tablet (10 mg total) by mouth daily. 05/18/18  Yes Darylene Price A, FNP  dapagliflozin propanediol (FARXIGA) 10 MG TABS tablet Take 10 mg by mouth daily. 07/16/18  Yes Samik Balkcom A, FNP  Fluticasone-Salmeterol (ADVAIR) 250-50 MCG/DOSE AEPB Inhale 1 puff into the lungs 2 (two) times daily. 04/03/17  Yes Doles-Johnson, Teah, NP  hydrALAZINE (APRESOLINE) 25 MG tablet TAKE ONE TABLET BY MOUTH 2 TIMES A DAY 02/11/19  Yes Lexxie Winberg A, FNP  ibuprofen (ADVIL) 200 MG tablet Take 200 mg by mouth every 6 (six) hours as needed.   Yes [provider]  insulin glargine (LANTUS) 100 UNIT/ML injection Inject 0.2 mLs (20 Units total) into the skin  daily. Decrease by 5 units every three days is AM sugars less than 100. Increase by 2 units every three days if AM sugars more than 150 Patient taking differently: Inject 22 Units into the skin daily. Decrease by 5 units every three days is AM sugars less than 100. Increase by 2 units every three days if AM sugars more than 150 06/30/18  Yes Buse, John B, MD  ipratropium-albuterol (DUONEB) 0.5-2.5 (3) MG/3ML SOLN Take 3 mLs by nebulization every 4 (four) hours as needed.   Yes [provider]  losartan (COZAAR) 100 MG tablet Take 1 tablet (100 mg total) by mouth daily. 05/18/18  Yes Darylene Price A, FNP  metFORMIN (GLUCOPHAGE) 500 MG tablet Take 1 tablet (500 mg total) by mouth 2 (two) times daily with a meal. 06/30/18  Yes Buse, Hewitt Blade, MD  potassium chloride SA (K-DUR) 20 MEQ tablet Take 1 tablet (20 mEq total) by mouth 2 (two) times daily. 07/16/18  Yes Darylene Price A, FNP  spironolactone (ALDACTONE) 25 MG tablet TAKE ONE TABLET BY MOUTH EVERY DAY 06/04/18  Yes Darylene Price A, FNP  tadalafil (CIALIS) 20 MG tablet Take 1 tablet (20 mg total) by mouth daily as needed for erectile dysfunction. 08/06/17  Yes McGowan, Larene Beach A, PA-C  furosemide (LASIX) 40 MG tablet Take 1 tablet (40 mg total) by mouth 2 (two) times daily. 07/16/18 02/15/19  Alisa Graff, FNP  liraglutide (VICTOZA) 18 MG/3ML SOPN Inject 0.3 mLs (1.8 mg total) into the skin 1 day or 1 dose for 1 dose. 06/30/18 02/15/19  Barnet Pall, MD     Review of Systems  Constitutional: Positive for fatigue. Negative for appetite change.  HENT: Positive for congestion and postnasal drip. Negative for sore throat.   Eyes: Negative.   Respiratory: Positive for cough (worsening last couple of days), shortness of breath (worsening) and wheezing (at times). Negative for chest tightness.   Cardiovascular: Positive for leg swelling. Negative for chest pain and palpitations.  Gastrointestinal: Positive for abdominal distention. Negative for abdominal  pain.       Abdominal cramping at times  Endocrine: Negative.   Genitourinary: Negative.   Musculoskeletal: Negative for back pain and neck pain.       Soreness/ tenderness over right upper chest area  Skin: Negative.   Allergic/Immunologic: Negative.   Neurological: Positive for dizziness (after coughing hard). Negative for light-headedness.  Hematological: Negative for adenopathy. Does not bruise/bleed easily.  Psychiatric/Behavioral: Negative for dysphoric mood, sleep disturbance and suicidal ideas. The patient is not nervous/anxious.    Vitals:   03/12/19 0843  BP: 128/76  Pulse: 90  Resp: 20  SpO2: 97%  Weight: 294 lb (133.4 kg)  Height: 5\' 9"  (1.753 m)   Wt Readings from Last 3 Encounters:  03/12/19 294 lb (133.4 kg)  02/15/19 293 lb 9.6 oz (133.2 kg)  08/17/18 286 lb 6 oz (129.9 kg)   Lab Results  Component Value Date   CREATININE 1.17 07/23/2018   CREATININE 1.33 (H) 11/11/2017   CREATININE 1.25 08/05/2017     Physical Exam  Constitutional: He is oriented to person, place, and time. He appears well-developed and well-nourished.  HENT:  Head: Normocephalic and atraumatic.  Neck: No JVD present.  Cardiovascular: Normal rate and regular rhythm.  Pulmonary/Chest: Effort normal. He has no wheezes. He has no rales.  Abdominal: Soft. He exhibits distension. There is no abdominal tenderness.  Musculoskeletal:        General: No tenderness or edema.     Cervical back: Normal range of motion and neck supple.  Neurological: He is alert and oriented to person, place, and time.  Skin: Skin is warm and dry.  Psychiatric: He has a normal mood and affect. His behavior is normal.  Nursing note and vitals reviewed.   Assessment & Plan:  1: Chronic heart failure with now preserved ejection fraction-  - NYHA Class III - mildly fluid overloaded today with abdominal distention & worsening shortness of breath - weighing daily and he was reminded to call for an overnight  weight gain of >2 pounds or a weekly weight gain of >5 pounds - weight stable since last here ~ 1 month ago - trying to eat low sodium foods when he can but says it's difficult at times due to finances - drinking 64-96 ounces of water daily; explained that he needed to keep it closer to 64 ounces / day - will stop his furosemide and begin torsemide 40mg  BID - already scheduled to get lab work drawn at [facility] on 03/24/19 - encouraged to get compression socks and put them on in the morning when he doesn't have edema and remove them at bedtime (he says his legs get swollen around 4-5pm each day) - does not take the flu vaccine; good handwashing encouraged  2: HTN- - BP looks good today - went to [facility] on 12/29/2018 - BMP on 07/23/2018 reviewed and shows sodium 140, potassium 3.8, creatinine 1.17 and GFR 83  3: Chest wall swelling- - possible gynecomastia although he's been on spironolactone for many years - will hold spironolactone for a week and see if symptom improves - if symptom continues, he was advised to follow-up with PCP about this  4: Diabetes- - fasting glucose this morning in clinic was 181 - A1c on 12/29/2018 was 8.5%  Patient did not bring his medications nor a list. Each medication was verbally reviewed with the patient and he was encouraged to bring the bottles to every visit to confirm accuracy of list.  Return in 1 week or sooner for any questions/problems before then.

## 2019-03-12 NOTE — Patient Instructions (Addendum)
Continue weighing daily and call for an overnight weight gain of > 2 pounds or a weekly weight gain of >5 pounds.  Stop furosemide and begin torsemide 2 tablets twice daily.   Hold spironolactone for right now.   Get compression socks and put them on in the morning and remove at bedtime.

## 2019-03-16 ENCOUNTER — Telehealth: Payer: Self-pay | Admitting: Pharmacist

## 2019-03-16 NOTE — Telephone Encounter (Signed)
03/16/2019 9:20:08 AM - Call to [facility] for [place] - Tuesday, March 16, 2019 9:14 AM -- Eva spoke with [employer] to check status of [name] application that was faxed 02/10/2019--she reveiwed and stating patient was approved on 02/11/19 till 02/11/2020-Medication has not been shipped-has a note that they need a script--I asked if page 3 of their application not still serve as their script, she reviewed and stated yes that was correct, and she has all that she needs to process for shipment-sending to the pharmacy to ship allow 3-5 days for processing and 7-10 days to receive at our office. I asked if they could not expedite due to they had all they needed to process apparently an oversite in their office. She stated she will note and send to the pharmacy. I will copy this note to [facility] for Otila Kluver to have access to, patient has an upcoming appt with her on 03/19/19.

## 2019-03-18 NOTE — Progress Notes (Signed)
Patient ID: Bradley Mckenzie., male    DOB: 1965/05/20, 54 y.o.   MRN: [id number]  HPI  [name] is a 54 y/o male who has a history of diabetes, HTN, obstructive sleep apnea, depression, hyperlipidemia, morbid obesity and heart failure with a reduced ejection fraction. Remote tobacco exposure.   Echo report from 10/28/2018 reviewed and showed an EF of 60-65% along with mildly elevated PA pressure. Last echo was done 09/02/15 with an EF of 30-35%, mild mitral regurgitation and no aortic stenosis. EF has declined from 40-45% in March 2017 but he had been inconsistent taking his medications & not using his CPAP during this time.  Has not been admitted or been in the ED in the last 6 months.   Returns today for a follow-up visit with a chief complaint of moderate shortness of breath upon minimal exertion. He describes this as chronic in nature having been present for several years although he does feel like his breathing is a little better since having his diuretic changed to torsemide. He has associated fatigue, cough, wheezing, abdominal distention (although better) and dizziness along with this. He denies any difficulty sleeping, palpitations, pedal edema, chest pain or weight gain.   Is unsure if the breast pain/swelling has improved since he was here last. He has stopped the spironolactone but then mistakenly restarted it again.   Past Medical History:  Diagnosis Date  . Cardiomyopathy ([place])    a. 01/2014 Echo: EF 25-30%;  b. ? ischemic vs non-ischemic.  He's never had an ischemic eval.  . Chronic combined systolic (congestive) and diastolic (congestive) heart failure ([name])    a. 01/2014 Echo: EF 25-30%. b. Echo 03/2015: Improved EF of 40-45%  . Depression   . Diabetes mellitus without complication ([name])    a. Not previously on outpt meds.  . Hyperlipidemia   . Hypertensive heart disease    a. Since his 19's.  . Morbid obesity ([name])   . Obstructive sleep apnea    a. Has not used CPAP  since ~ 2012.   Past Surgical History:  Procedure Laterality Date  . HERNIA REPAIR  2004   Family History  Problem Relation Age of Onset  . Heart disease Mother        alive & well.  [name] Heart attack Father 54       died @ 17 of cancer.  . Diabetes Father   . Cancer Father        "in his stomach"  . Cancer Sister   . Cancer Paternal Uncle    Social History   Tobacco Use  . Smoking status: Former Research scientist (life sciences)  . Smokeless tobacco: Never Used  . Tobacco comment: Quit in his 45's.  Substance Use Topics  . Alcohol use: Yes    Alcohol/week: 0.0 standard drinks    Comment: occassional alcohol use at special events   Past Medical History:  Diagnosis Date  . Cardiomyopathy ([place])    a. 01/2014 Echo: EF 25-30%;  b. ? ischemic vs non-ischemic.  He's never had an ischemic eval.  . Chronic combined systolic (congestive) and diastolic (congestive) heart failure ([name])    a. 01/2014 Echo: EF 25-30%. b. Echo 03/2015: Improved EF of 40-45%  . Depression   . Diabetes mellitus without complication ([name])    a. Not previously on outpt meds.  . Hyperlipidemia   . Hypertensive heart disease    a. Since his 23's.  . Morbid obesity ([name])   . Obstructive sleep apnea  a. Has not used CPAP since ~ 2012.   Past Surgical History:  Procedure Laterality Date  . HERNIA REPAIR  2004   Family History  Problem Relation Age of Onset  . Heart disease Mother        alive & well.  [name] Heart attack Father 34       died @ 49 of cancer.  . Diabetes Father   . Cancer Father        "in his stomach"  . Cancer Sister   . Cancer Paternal Uncle    Social History   Tobacco Use  . Smoking status: Former Research scientist (life sciences)  . Smokeless tobacco: Never Used  . Tobacco comment: Quit in his 55's.  Substance Use Topics  . Alcohol use: Yes    Alcohol/week: 0.0 standard drinks    Comment: occassional alcohol use at special events   No Known Allergies Prior to Admission medications   Medication Sig Start Date End Date  Taking? Authorizing Provider  albuterol (VENTOLIN HFA) 108 (90 Base) MCG/ACT inhaler Inhale 2 puffs into the lungs every 6 (six) hours as needed for wheezing or shortness of breath. 06/04/18  Yes McGowan, Shannon A, PA-C  ASPIRIN LOW DOSE 81 MG EC tablet TAKE ONE TABLET BY MOUTH EVERY DAY 06/04/18  Yes Darylene Price A, FNP  atorvastatin (LIPITOR) 10 MG tablet TAKE ONE TABLET BY MOUTH EVERY DAY 01/20/18  Yes Brittini Brubeck A, FNP  carvedilol (COREG) 12.5 MG tablet TAKE ONE TABLET BY MOUTH 2 TIMES A DAY WITH A MEAL. 02/11/19  Yes Makell Drohan, Otila Kluver A, FNP  cetirizine (ZYRTEC) 10 MG tablet Take 1 tablet (10 mg total) by mouth daily. 05/18/18  Yes Darylene Price A, FNP  dapagliflozin propanediol (FARXIGA) 10 MG TABS tablet Take 10 mg by mouth daily. 07/16/18  Yes Josephine Wooldridge A, FNP  Fluticasone-Salmeterol (ADVAIR) 250-50 MCG/DOSE AEPB Inhale 1 puff into the lungs 2 (two) times daily. 04/03/17  Yes Doles-Johnson, Teah, NP  hydrALAZINE (APRESOLINE) 25 MG tablet TAKE ONE TABLET BY MOUTH 2 TIMES A DAY 02/11/19  Yes Trayden Brandy A, FNP  ibuprofen (ADVIL) 200 MG tablet Take 200 mg by mouth every 6 (six) hours as needed.   Yes [provider]  insulin glargine (LANTUS) 100 UNIT/ML injection Inject 0.2 mLs (20 Units total) into the skin daily. Decrease by 5 units every three days is AM sugars less than 100. Increase by 2 units every three days if AM sugars more than 150 Patient taking differently: Inject 22 Units into the skin daily. Decrease by 5 units every three days is AM sugars less than 100. Increase by 2 units every three days if AM sugars more than 150 06/30/18  Yes Buse, John B, MD  ipratropium-albuterol (DUONEB) 0.5-2.5 (3) MG/3ML SOLN Take 3 mLs by nebulization every 4 (four) hours as needed.   Yes [provider]  losartan (COZAAR) 100 MG tablet Take 1 tablet (100 mg total) by mouth daily. 05/18/18  Yes Darylene Price A, FNP  metFORMIN (GLUCOPHAGE) 500 MG tablet Take 1 tablet (500 mg total) by mouth 2  (two) times daily with a meal. 06/30/18  Yes Buse, Hewitt Blade, MD  potassium chloride SA (K-DUR) 20 MEQ tablet Take 1 tablet (20 mEq total) by mouth 2 (two) times daily. 07/16/18  Yes Darylene Price A, FNP  spironolactone (ALDACTONE) 25 MG tablet TAKE ONE TABLET BY MOUTH EVERY DAY 06/04/18  Yes Darylene Price A, FNP  tadalafil (CIALIS) 20 MG tablet Take 1 tablet (20 mg  total) by mouth daily as needed for erectile dysfunction. 08/06/17  Yes McGowan, Larene Beach A, PA-C  torsemide (DEMADEX) 20 MG tablet Take 2 tablets (40 mg total) by mouth 2 (two) times daily. 03/12/19 06/10/19 Yes Ermal Brzozowski A, FNP  liraglutide (VICTOZA) 18 MG/3ML SOPN Inject 0.3 mLs (1.8 mg total) into the skin 1 day or 1 dose for 1 dose. 06/30/18 03/12/19  Barnet Pall, MD     Review of Systems  Constitutional: Positive for fatigue. Negative for appetite change.  HENT: Positive for congestion and postnasal drip. Negative for sore throat.   Eyes: Negative.   Respiratory: Positive for cough (worsening last couple of days), shortness of breath (improving) and wheezing (at times). Negative for chest tightness.   Cardiovascular: Negative for chest pain, palpitations and leg swelling.  Gastrointestinal: Positive for abdominal distention (improving). Negative for abdominal pain.  Endocrine: Negative.   Genitourinary: Negative.   Musculoskeletal: Negative for back pain and neck pain.       Soreness/ tenderness over right upper chest area  Skin: Negative.   Allergic/Immunologic: Negative.   Neurological: Positive for dizziness (after coughing hard). Negative for light-headedness.  Hematological: Negative for adenopathy. Does not bruise/bleed easily.  Psychiatric/Behavioral: Negative for dysphoric mood, sleep disturbance and suicidal ideas. The patient is not nervous/anxious.    Vitals:   03/19/19 0902  BP: (!) 143/74  Pulse: 95  Resp: 20  SpO2: 97%  Weight: 291 lb 12.8 oz (132.4 kg)  Height: 5\' 9"  (1.753 m)   Wt Readings from Last 3  Encounters:  03/19/19 291 lb 12.8 oz (132.4 kg)  03/12/19 294 lb (133.4 kg)  02/15/19 293 lb 9.6 oz (133.2 kg)   Lab Results  Component Value Date   CREATININE 1.17 07/23/2018   CREATININE 1.33 (H) 11/11/2017   CREATININE 1.25 08/05/2017    Physical Exam  Constitutional: He is oriented to person, place, and time. He appears well-developed and well-nourished.  HENT:  Head: Normocephalic and atraumatic.  Neck: No JVD present.  Cardiovascular: Normal rate and regular rhythm.  Pulmonary/Chest: Effort normal. He has wheezes (bilateral lower lobes). He has no rales.  Abdominal: Soft. He exhibits no distension. There is no abdominal tenderness.  Musculoskeletal:        General: No tenderness or edema.     Cervical back: Normal range of motion and neck supple.  Neurological: He is alert and oriented to person, place, and time.  Skin: Skin is warm and dry.  Psychiatric: He has a normal mood and affect. His behavior is normal.  Nursing note and vitals reviewed.   Assessment & Plan:  1: Chronic heart failure with now preserved ejection fraction-  - NYHA Class III - euvolemic today - weighing daily and he was reminded to call for an overnight weight gain of >2 pounds or a weekly weight gain of >5 pounds - weight down 3 pounds since last here ~ 1 week ago - trying to eat low sodium foods when he can but says it's difficult at times due to finances - drinking 64-96 ounces of water daily; explained that he needed to keep it closer to 64 ounces / day - diuretic changed to torsemide 40mg  BID last week - already scheduled to get lab work drawn at [facility] on 03/24/19 - has approval letter from [place] but still doesn't have farxiga; advised him to call the number on that letter to see if he needs to do anything or if the medication has been lost -  does not take the flu vaccine; good handwashing encouraged  2: HTN- - BP mildly elevated today (143/74) - went to [facility]  on 12/29/2018 & returns 04/01/19 - BMP on 07/23/2018 reviewed and shows sodium 140, potassium 3.8, creatinine 1.17 and GFR 83  3: Chest wall swelling- - possible gynecomastia although he's been on spironolactone for many years - he mistakenly resumed spironolactone so he will stop it again and see if that helps - if symptom continues, he was advised to follow-up with PCP about this  4: Diabetes- - fasting glucose at home this morning was 117 - A1c on 12/29/2018 was 8.5%   Patient did not bring his medications nor a list. Each medication was verbally reviewed with the patient and he was encouraged to bring the bottles to every visit to confirm accuracy of list.  Return in 5 months or sooner for any questions/problems before then.

## 2019-03-19 ENCOUNTER — Other Ambulatory Visit: Payer: Self-pay

## 2019-03-19 ENCOUNTER — Ambulatory Visit: Payer: Self-pay | Attending: Family | Admitting: Family

## 2019-03-19 ENCOUNTER — Encounter: Payer: Self-pay | Admitting: Family

## 2019-03-19 VITALS — BP 143/74 | HR 95 | Resp 20 | Ht 69.0 in | Wt 291.8 lb

## 2019-03-19 DIAGNOSIS — Z794 Long term (current) use of insulin: Secondary | ICD-10-CM | POA: Insufficient documentation

## 2019-03-19 DIAGNOSIS — I5032 Chronic diastolic (congestive) heart failure: Secondary | ICD-10-CM

## 2019-03-19 DIAGNOSIS — E785 Hyperlipidemia, unspecified: Secondary | ICD-10-CM | POA: Insufficient documentation

## 2019-03-19 DIAGNOSIS — Z7982 Long term (current) use of aspirin: Secondary | ICD-10-CM | POA: Insufficient documentation

## 2019-03-19 DIAGNOSIS — Z8249 Family history of ischemic heart disease and other diseases of the circulatory system: Secondary | ICD-10-CM | POA: Insufficient documentation

## 2019-03-19 DIAGNOSIS — I429 Cardiomyopathy, unspecified: Secondary | ICD-10-CM | POA: Insufficient documentation

## 2019-03-19 DIAGNOSIS — E119 Type 2 diabetes mellitus without complications: Secondary | ICD-10-CM | POA: Insufficient documentation

## 2019-03-19 DIAGNOSIS — R222 Localized swelling, mass and lump, trunk: Secondary | ICD-10-CM

## 2019-03-19 DIAGNOSIS — Z79899 Other long term (current) drug therapy: Secondary | ICD-10-CM | POA: Insufficient documentation

## 2019-03-19 DIAGNOSIS — I110 Hypertensive heart disease with heart failure: Secondary | ICD-10-CM | POA: Insufficient documentation

## 2019-03-19 DIAGNOSIS — I10 Essential (primary) hypertension: Secondary | ICD-10-CM

## 2019-03-19 DIAGNOSIS — Z7951 Long term (current) use of inhaled steroids: Secondary | ICD-10-CM | POA: Insufficient documentation

## 2019-03-19 DIAGNOSIS — Z833 Family history of diabetes mellitus: Secondary | ICD-10-CM | POA: Insufficient documentation

## 2019-03-19 DIAGNOSIS — I5042 Chronic combined systolic (congestive) and diastolic (congestive) heart failure: Secondary | ICD-10-CM | POA: Insufficient documentation

## 2019-03-19 DIAGNOSIS — Z87891 Personal history of nicotine dependence: Secondary | ICD-10-CM | POA: Insufficient documentation

## 2019-03-19 DIAGNOSIS — G4733 Obstructive sleep apnea (adult) (pediatric): Secondary | ICD-10-CM | POA: Insufficient documentation

## 2019-03-19 NOTE — Patient Instructions (Signed)
Continue weighing daily and call for an overnight weight gain of > 2 pounds or a weekly weight gain of >5 pounds. 

## 2019-03-24 ENCOUNTER — Other Ambulatory Visit: Payer: Self-pay

## 2019-03-24 DIAGNOSIS — E119 Type 2 diabetes mellitus without complications: Secondary | ICD-10-CM

## 2019-03-24 DIAGNOSIS — E118 Type 2 diabetes mellitus with unspecified complications: Secondary | ICD-10-CM

## 2019-03-25 LAB — CBC WITH DIFFERENTIAL/PLATELET
Basophils Absolute: 0.1 10*3/uL (ref 0.0–0.2)
Basos: 1 %
EOS (ABSOLUTE): 0.4 10*3/uL (ref 0.0–0.4)
Eos: 4 %
Hematocrit: 35.1 % — ABNORMAL LOW (ref 37.5–51.0)
Hemoglobin: 11.9 g/dL — ABNORMAL LOW (ref 13.0–17.7)
Immature Grans (Abs): 0 10*3/uL (ref 0.0–0.1)
Immature Granulocytes: 0 %
Lymphocytes Absolute: 2.3 10*3/uL (ref 0.7–3.1)
Lymphs: 22 %
MCH: 27.9 pg (ref 26.6–33.0)
MCHC: 33.9 g/dL (ref 31.5–35.7)
MCV: 82 fL (ref 79–97)
Monocytes Absolute: 0.9 10*3/uL (ref 0.1–0.9)
Monocytes: 9 %
Neutrophils Absolute: 6.5 10*3/uL (ref 1.4–7.0)
Neutrophils: 64 %
Platelets: 289 10*3/uL (ref 150–450)
RBC: 4.26 x10E6/uL (ref 4.14–5.80)
RDW: 14.9 % (ref 11.6–15.4)
WBC: 10.1 10*3/uL (ref 3.4–10.8)

## 2019-03-25 LAB — HEMOGLOBIN A1C
Est. average glucose Bld gHb Est-mCnc: 212 mg/dL
Hgb A1c MFr Bld: 9 % — ABNORMAL HIGH (ref 4.8–5.6)

## 2019-03-30 ENCOUNTER — Other Ambulatory Visit: Payer: Self-pay

## 2019-03-30 ENCOUNTER — Ambulatory Visit: Payer: Self-pay | Admitting: Gerontology

## 2019-03-30 DIAGNOSIS — E118 Type 2 diabetes mellitus with unspecified complications: Secondary | ICD-10-CM

## 2019-03-30 NOTE — Progress Notes (Addendum)
Follow up Diabetes/ Endocrine [facility]     Patient ID: Bradley Stordahl., male   DOB: Nov 06, 1965, 54 y.o.   MRN: [id number] Assessment:  Bradley Hestand. is a 54 y.o. male who is seen in follow up for T2DM at the request of [place], Chioma E, NP.   Encounter Diagnoses No diagnosis found.  Assessment  Bradley Mckenzie is a 10-yo man with a PMH of T2DM, cardiomyopathy, obesity, hypertension, hyperlipidemia, and obstructive sleep apnea, presenting for follow-up for T2DM management. As A1c has increased to 9.0 from 8.5, patient would likely benefit from the glucose control and possible weight loss with Mckenzie Glade, so overcoming the challenge with paperwork is recommended. Patient requested and would also benefit from nutrition consultation for diet planning.  Plan:    1. Try to obtain the prescription for Farxiga 10mg . If paperwork continues to be an obstacle, consider switching to Glipizide. 2. Test out checking blood sugar two hours after meals to observe how certain meals affect blood sugar. 3. Request referral for nutrition consultation 4. Schedule with ophthalmology 5. Follow up in 3 months    There are no Patient Instructions on file for this visit.   No orders of the defined types were placed in this encounter.    Subjective:  Bradley Pitts. Is a 54 year old man with a past medical history of Cardiomyopathy ([place]), Chronic combined systolic (congestive) and diastolic (congestive) heart failure ([place]), Depression, Diabetes mellitus without complication ([place]), Hyperlipidemia, Hypertensive heart disease, Morbid obesity ([place]), and Obstructive sleep apnea. His T2DM is currently managed with 500mg  Metformin bid, 22 units Lantus in the morning, 1.8mg  Victoza in the morning. He has been prescribed 10mg  Mckenzie Glade, but has not recently it due to paperwork with [facility]. He prefers to take medication in the morning so that his wife can help with meds requiring an injection. His fasting  glucose readings were >200 and 170-180s a few weeks ago, but have come back down to 110s-120s. His A1c is 9.0, up from 8.5 3-51months ago. Bradley Mckenzie also discusses wanting a nutritionist consultation, to know how to improve his diet. He denies vision changes, but notes occasional numbness in his fingers.    Review of Systems  Eyes: Negative for visual disturbance.  Neurological: Positive for numbness.    Family History, Social History, current Medications and allergies reviewed and updated in Epic.  Objective:    There were no vitals taken for this visit. Physical Exam      Data : I have personally reviewed pertinent labs and imaging studies, if indicated,  with the patient in clinic today.   Lab Orders  No laboratory test(s) ordered today    HC Readings from Last 3 Encounters:  No data found for [facility]    Wt Readings from Last 3 Encounters:  03/24/19 294 lb 12.8 oz (133.7 kg)  03/19/19 291 lb 12.8 oz (132.4 kg)  03/12/19 294 lb (133.4 kg)

## 2019-03-31 ENCOUNTER — Other Ambulatory Visit: Payer: Self-pay

## 2019-03-31 DIAGNOSIS — E118 Type 2 diabetes mellitus with unspecified complications: Secondary | ICD-10-CM

## 2019-04-01 ENCOUNTER — Other Ambulatory Visit: Payer: Self-pay

## 2019-04-01 ENCOUNTER — Ambulatory Visit: Payer: Self-pay | Admitting: Urology

## 2019-04-01 VITALS — BP 116/76 | HR 107 | Ht 69.0 in | Wt 293.3 lb

## 2019-04-01 DIAGNOSIS — I10 Essential (primary) hypertension: Secondary | ICD-10-CM

## 2019-04-01 DIAGNOSIS — I5022 Chronic systolic (congestive) heart failure: Secondary | ICD-10-CM

## 2019-04-01 DIAGNOSIS — N62 Hypertrophy of breast: Secondary | ICD-10-CM

## 2019-04-01 DIAGNOSIS — E118 Type 2 diabetes mellitus with unspecified complications: Secondary | ICD-10-CM

## 2019-04-01 NOTE — Progress Notes (Signed)
Patient: Bradley Mckenzie. Male    DOB: Apr 03, 1965   54 y.o.   MRN: [id number] Visit Date: 04/01/2019  Today's Provider: Zara Council, PA-C   Chief Complaint  Patient presents with  . Follow-up   Subjective:    HPI   CHF  - seen by Alisa Graff, FNP on 03/19/2019   - not weighing himself everyday  - hasn't picked up the [place] yet, going to check tomorrow  - trying to keep his water to 4 bottles daily  HTN  - BP at goal today @ 116/76  - keeping away from salt   Gynecomastia  - associated right axillar swelling   - has been on spironolactone for many years, discontinued 2 weeks ago  DM  - HbgA1c up to 9.0% from 8.5% three months ago  - tele health visit with endocrinology on 03/30/2019, advised to get the [place] (hopefully tomorrow), advised to check his BS 2 hours after meals  - he is checking his fasting BS in the morning now - levels 1115 - 203  - request to see Lifestyle   - needs to ophthalmology and will fill out charity care for [facility] as well      No Known Allergies Previous Medications   ALBUTEROL (VENTOLIN HFA) 108 (90 BASE) MCG/ACT INHALER    Inhale 2 puffs into the lungs every 6 (six) hours as needed for wheezing or shortness of breath.   ASPIRIN LOW DOSE 81 MG EC TABLET    TAKE ONE TABLET BY MOUTH EVERY DAY   ATORVASTATIN (LIPITOR) 10 MG TABLET    TAKE ONE TABLET BY MOUTH EVERY DAY   CARVEDILOL (COREG) 12.5 MG TABLET    TAKE ONE TABLET BY MOUTH 2 TIMES A DAY WITH A MEAL.   CETIRIZINE (ZYRTEC) 10 MG TABLET    Take 1 tablet (10 mg total) by mouth daily.   DAPAGLIFLOZIN PROPANEDIOL (FARXIGA) 10 MG TABS TABLET    Take 10 mg by mouth daily.   FLUTICASONE-SALMETEROL (ADVAIR) 250-50 MCG/DOSE AEPB    Inhale 1 puff into the lungs 2 (two) times daily.   HYDRALAZINE (APRESOLINE) 25 MG TABLET    TAKE ONE TABLET BY MOUTH 2 TIMES A DAY   IBUPROFEN (ADVIL) 200 MG TABLET    Take 200 mg by mouth every 6 (six) hours as needed.   INSULIN GLARGINE (LANTUS) 100 UNIT/ML  INJECTION    Inject 0.2 mLs (20 Units total) into the skin daily. Decrease by 5 units every three days is AM sugars less than 100. Increase by 2 units every three days if AM sugars more than 150   IPRATROPIUM-ALBUTEROL (DUONEB) 0.5-2.5 (3) MG/3ML SOLN    Take 3 mLs by nebulization every 4 (four) hours as needed.   LIRAGLUTIDE (VICTOZA) 18 MG/3ML SOPN    Inject 0.3 mLs (1.8 mg total) into the skin 1 day or 1 dose for 1 dose.   LOSARTAN (COZAAR) 100 MG TABLET    Take 1 tablet (100 mg total) by mouth daily.   METFORMIN (GLUCOPHAGE) 500 MG TABLET    Take 1 tablet (500 mg total) by mouth 2 (two) times daily with a meal.   POTASSIUM CHLORIDE SA (K-DUR) 20 MEQ TABLET    Take 1 tablet (20 mEq total) by mouth 2 (two) times daily.   SPIRONOLACTONE (ALDACTONE) 25 MG TABLET    TAKE ONE TABLET BY MOUTH EVERY DAY   TADALAFIL (CIALIS) 20 MG TABLET    Take 1 tablet (20 mg total) by mouth  daily as needed for erectile dysfunction.   TORSEMIDE (DEMADEX) 20 MG TABLET    Take 2 tablets (40 mg total) by mouth 2 (two) times daily.    Review of Systems  Social History   Tobacco Use  . Smoking status: Former Research scientist (life sciences)  . Smokeless tobacco: Never Used  . Tobacco comment: Quit in his 89's.  Substance Use Topics  . Alcohol use: Not Currently    Alcohol/week: 0.0 standard drinks    Comment: occassional alcohol use at special events   Objective:   BP 116/76   Pulse (!) 107   Ht 5\' 9"  (1.753 m)   Wt 293 lb 4.8 oz (133 kg)   SpO2 93%   BMI 43.31 kg/m   Physical Exam Constitutional:  Well nourished. Alert and oriented, No acute distress. HEENT: [place] AT, mask in place.  Trachea midline, no masses. Cardiovascular: No clubbing, cyanosis, or edema. Respiratory: Normal respiratory effort, no increased work of breathing. GI: Abdomen is soft, non tender, non distended, no abdominal masses.  Breast: Bilateral gynecomastia with right axillar swelling  Neurologic: Grossly intact, no focal deficits, moving all 4  extremities. Psychiatric: Normal mood and affect.      Assessment & Plan:     1. Chronic systolic heart failure ([place]) - encouraged to weigh himself daily - encouraged to keep visit with Darylene Price, FNP  2. Gynecomastia, male - MM DIGITAL SCREENING BILATERAL; Future - US BREAST COMPLETE UNI RIGHT INC AXILLA; Future - US BREAST LTD UNI LEFT INC AXILLA; Future  3. Type 2 diabetes mellitus with complication ([place]) - hopefully will be able to start Farxiga, it not will need to start glipizide - Referral to Nutrition and Diabetes Services - advised to check sugars two hours after meals and record   4. Essential hypertension - good control today    RTC in 3 months for Hbg A1c and CBC    Asianna Brundage, PA-C   [facility]

## 2019-04-06 ENCOUNTER — Other Ambulatory Visit: Payer: Self-pay

## 2019-04-06 DIAGNOSIS — N62 Hypertrophy of breast: Secondary | ICD-10-CM

## 2019-04-16 ENCOUNTER — Telehealth: Payer: Self-pay | Admitting: Pharmacy Technician

## 2019-04-16 NOTE — Telephone Encounter (Signed)
Patient provided bank statement that shows withdrawal coming from another bank account and being deposited into the bank account for the statement provided.  Contacted patient.  Patient stated that he was coming by [facility] to speak with me, because he wasn't aware of another bank account.  [facility]

## 2019-04-20 ENCOUNTER — Other Ambulatory Visit: Payer: Self-pay | Admitting: Urology

## 2019-05-03 ENCOUNTER — Other Ambulatory Visit: Payer: Self-pay | Admitting: Urology

## 2019-05-06 ENCOUNTER — Other Ambulatory Visit: Payer: Self-pay | Admitting: Family

## 2019-05-07 ENCOUNTER — Other Ambulatory Visit: Payer: Self-pay | Admitting: Family

## 2019-05-17 ENCOUNTER — Telehealth: Payer: Self-pay | Admitting: Pharmacy Technician

## 2019-05-17 NOTE — Telephone Encounter (Signed)
Received updated proof of income.  Patient eligible to receive medication assistance at [facility] until time for re-certification in 9359, and as long as eligibility requirements continue to be met.  [place] [facility]

## 2019-06-02 ENCOUNTER — Telehealth: Payer: Self-pay | Admitting: Pharmacist

## 2019-06-02 NOTE — Telephone Encounter (Signed)
06/02/2019 10:13:46 AM - Lantus Solostar refill to [facility] - Wednesday, Jun 02, 2019 10:12 AM --Virgel Gess [employer] refill for Lantus Solostar Max 22 units every day, # 2 boxes.

## 2019-06-08 ENCOUNTER — Telehealth: Payer: Self-pay | Admitting: Pharmacist

## 2019-06-08 ENCOUNTER — Other Ambulatory Visit: Payer: Self-pay | Admitting: Gerontology

## 2019-06-08 NOTE — Telephone Encounter (Signed)
06/08/2019 3:10:07 PM - [place] renewal faxed for Ventolin & Advair  -- Bradley Mckenzie - Tuesday, Jun 08, 2019 3:08 PM --Faxed [employer] renewal application for Ventolin HFA 43mcg Inhale 2 puffs into the lungs every 6 hours as needed for wheezing or shortness of breath & Advair 250/58mcg Inhale 1 puff into the lungs two times a day.

## 2019-06-10 ENCOUNTER — Ambulatory Visit: Payer: Self-pay

## 2019-06-10 ENCOUNTER — Other Ambulatory Visit: Payer: Self-pay

## 2019-06-10 ENCOUNTER — Telehealth: Payer: Self-pay

## 2019-06-10 DIAGNOSIS — Z79899 Other long term (current) drug therapy: Secondary | ICD-10-CM

## 2019-06-10 MED ORDER — BASAGLAR KWIKPEN 100 UNIT/ML ~~LOC~~ SOPN: 15.0000 [IU] | PEN_INJECTOR | Freq: Every day | SUBCUTANEOUS | 3 refills | Status: DC

## 2019-06-10 NOTE — Progress Notes (Cosign Needed)
Medication Management Clinic Visit Note  Patient: Bradley Mckenzie. MRN: [id number] Date of Birth: Apr 16, 1965 PCP: Bradley Reusing, NP   [name]. 54 y.o. male presents for a MTM 6 month follow-up visit today via telephone. Pt was identified by name and DOB.  There were no vitals taken for this visit.  Patient Information   Past Medical History:  Diagnosis Date  . Asthma   . Cardiomyopathy ([place])    a. 01/2014 Echo: EF 25-30%;  b. ? ischemic vs non-ischemic.  He's never had an ischemic eval.  . Chronic combined systolic (congestive) and diastolic (congestive) heart failure ([place])    a. 01/2014 Echo: EF 25-30%. b. Echo 03/2015: Improved EF of 40-45%  . COPD (chronic obstructive pulmonary disease) ([place])   . Depression   . Diabetes mellitus without complication ([place])    a. Not previously on outpt meds.  . Hyperlipidemia   . Hypertension   . Hypertensive heart disease    a. Since his 80's.  . Morbid obesity ([place])   . Obstructive sleep apnea    a. Has not used CPAP since ~ 2012.      Past Surgical History:  Procedure Laterality Date  . HERNIA REPAIR  2004     Family History  Problem Relation Age of Onset  . Heart disease Mother        alive & well.  [name] Heart attack Father 28       died @ 19 of cancer.  . Diabetes Father   . Cancer Father        "in his stomach"  . Cancer Sister   . Cancer Paternal Uncle    Outpatient Encounter Medications as of 06/10/2019  Medication Sig  . albuterol (VENTOLIN HFA) 108 (90 Base) MCG/ACT inhaler Inhale 2 puffs into the lungs every 6 (six) hours as needed for wheezing or shortness of breath.  . ASPIRIN LOW DOSE 81 MG EC tablet TAKE ONE TABLET BY MOUTH EVERY DAY  . atorvastatin (LIPITOR) 10 MG tablet TAKE ONE TABLET BY MOUTH EVERY DAY  . carvedilol (COREG) 12.5 MG tablet TAKE ONE TABLET BY MOUTH 2 TIMES A DAY WITH A MEAL.  . cetirizine (ZYRTEC) 10 MG tablet Take 1 tablet (10 mg total) by mouth daily.  . dapagliflozin  propanediol (FARXIGA) 10 MG TABS tablet Take 10 mg by mouth daily.  . Fluticasone-Salmeterol (ADVAIR) 250-50 MCG/DOSE AEPB Inhale 1 puff into the lungs 2 (two) times daily.  . hydrALAZINE (APRESOLINE) 25 MG tablet TAKE ONE TABLET BY MOUTH 2 TIMES A DAY  . insulin glargine (LANTUS) 100 UNIT/ML injection Inject 0.2 mLs (20 Units total) into the skin daily. Decrease by 5 units every three days is AM sugars less than 100. Increase by 2 units every three days if AM sugars more than 150 (Patient taking differently: Inject 22 Units into the skin daily. Decrease by 5 units every three days is AM sugars less than 100. Increase by 2 units every three days if AM sugars more than 150)  . ipratropium-albuterol (DUONEB) 0.5-2.5 (3) MG/3ML SOLN INHALE CONTENTS OF ONE VIAL USING NEBULIZER EVERY 4 HOURS AS NEEDED  . liraglutide (VICTOZA) 18 MG/3ML SOPN Inject 0.3 mLs (1.8 mg total) into the skin 1 day or 1 dose for 1 dose.  . losartan (COZAAR) 100 MG tablet TAKE ONE TABLET BY MOUTH EVERY DAY  . metFORMIN (GLUCOPHAGE) 500 MG tablet Take 1 tablet (500 mg total) by mouth 2 (two) times daily with a meal.  .  potassium chloride SA (K-DUR) 20 MEQ tablet Take 1 tablet (20 mEq total) by mouth 2 (two) times daily.  . tadalafil (CIALIS) 20 MG tablet Take 1 tablet (20 mg total) by mouth daily as needed for erectile dysfunction.  . torsemide (DEMADEX) 20 MG tablet Take 2 tablets (40 mg total) by mouth 2 (two) times daily.  [name] ibuprofen (ADVIL) 200 MG tablet Take 200 mg by mouth every 6 (six) hours as needed.  [name] spironolactone (ALDACTONE) 25 MG tablet TAKE ONE TABLET BY MOUTH EVERY DAY (Patient not taking: Reported on 04/01/2019)   No facility-administered encounter medications on file as of 06/10/2019.   Health Maintenance/Date Completed  Last ED visit: 01/16/2018 Last Visit to PCP: This year Next Visit to PCP: August 2021 Specialist Visit: August 2021 Colonoscopy: No Flu Vaccine: no Pneumonia Vaccine: no COVID-19 Vaccine:  no Shingrix Vaccine: no  New Diagnoses (since last visit):   Family Support: Good  Lifestyle Pt tries to exercise 3x a week 15-60 mins Diet: Breakfast: Cereal, Bacon, oatmeal Lunch:sandwich, chips Dinner:BLT chips Drinks:water, sugar free drinks            Social History   Substance and Sexual Activity  Alcohol Use Not Currently  . Alcohol/week: 0.0 standard drinks   Comment: occassional alcohol use at special events      Social History   Tobacco Use  Smoking Status Former Smoker  Smokeless Tobacco Never Used  Tobacco Comment   Quit in his 20's.      Health Maintenance  Topic Date Due  . PNEUMOCOCCAL POLYSACCHARIDE VACCINE AGE 75-64 HIGH RISK  Never done  . FOOT EXAM  Never done  . OPHTHALMOLOGY EXAM  Never done  . COVID-19 Vaccine (1) Never done  . HIV Screening  Never done  . TETANUS/TDAP  Never done  . COLONOSCOPY  Never done  . INFLUENZA VACCINE  08/15/2019  . HEMOGLOBIN A1C  09/24/2019     Assessment and Plan:  Adherence/Compliance: Pt states when he has access to all his medications he rarely misses a dose. However as of late he's been frustrated with the timing of his refills. He has been running low on his insulin and in the past metformin and was told the refills has not been sent or they would be too early. He likes to keep the refill dates on all his medications consistent. He uses pill boxes to remember to take his meds. He has been having problems getting his lantus, and getting a nutritionist visit due to being told he hasn't turned in all the paperwork which he feels like he has. Pt states frustration that he's been trying to do all what's been told to him by the health system but seems to be getting conflicting information regarding diet, along with his difficulty acquiring medications so his sugars has been "all over the place." He is waiting for his charity case to be submitted through [facility] so he can schedule visits.   T2DM: Pt currently  on dapagliflozin, metformin, 22 units insulin glargine, victoza. Pt states as of late his sugars has been "all over the place." The most recent reading in the morning was ~200. He states that when everything was accessible to him his sugars were around 113-120. Pt's A1c in March 2021 was 9.0%. Pt states that he has been thirsty, constantly drinking, and urinating frequently. His diet has been heavy in carbs. Informed the patient that cutting back in carbs and adding things such as fruits and vegetables would potentially  also help. Pt expresses frustration that this is why he has been trying to schedule a visit with the nutritionist because people have been telling him different things, such as fruits that he should stay away from or these things that he ate in the past that weren't a problem before. He wishes to see the nutritionist to get on the right track with meal planning. Provided the pt with a website from the CDC: Diabetes Meal Planning, which has resources for him such as portion control, recipes, meal planning worksheet and lists of foods for him to build a foundation prior to getting a visit with the nutritionist. Pt was appreciative of the resource and state he will definitely check that out. Pt has not had a foot exam. Pt states sometimes he does feel lightheaded and dizzy.   HTN/HLD/CHF: Currently on losartan, carvedilol, torsemide, atorvastatin. Pt states that his BP has been good 116/76 (03/2019), he as of recent has not been measuring because his cuff at home needs batteries. Pt discontinued spironolactone due to adverse effects. His lipid panel from 07/2018 TC 135, HDL 42, LDL 73, TG, 101. Suggested for the pt when he get blood work done later this year to update his lipid panel.   COPD: Pt currently on albuterol, advair. Pt states that he does experience shortness of breath. He stated that he used to be able to use the trendmill for ~60 mins however then he could only handle 15 mins and now it's  barely 5 mins before he needs to stop. He states that depending on the week he would have to use his rescue inhaler more.   Pt states that he will need refills on torsemide, lantus.   Call lasted ~1 hour  RTC Follow-up 6 months  Willow Ora (Ty) Damarian Priola PharmD Candidate [facility]

## 2019-06-10 NOTE — Telephone Encounter (Signed)
Spoke with patient regarding Sport and exercise psychologist. Last application I received and processed provided financial coverage from 10/15/2018 until 04/15/2019. Patient states he filled out a new application and returned to [facility] before 04/2019. I explained to patient I do not have this application.   Patient understands I will mail new application to him and highlight all information that he needs to provide to [facility] for processing.   Patient understands he is to take care of this application himself and not return to [facility].  Patient understands once he receives approval letter from [facility], then he can make appt for Diabetes and Nutrition.

## 2019-06-17 ENCOUNTER — Other Ambulatory Visit: Payer: Self-pay

## 2019-06-17 DIAGNOSIS — E118 Type 2 diabetes mellitus with unspecified complications: Secondary | ICD-10-CM

## 2019-06-18 ENCOUNTER — Telehealth: Payer: Self-pay | Admitting: Pharmacist

## 2019-06-18 LAB — CBC WITH DIFFERENTIAL/PLATELET
Basophils Absolute: 0.1 10*3/uL (ref 0.0–0.2)
Basos: 1 %
EOS (ABSOLUTE): 0.2 10*3/uL (ref 0.0–0.4)
Eos: 2 %
Hematocrit: 39 % (ref 37.5–51.0)
Hemoglobin: 12.9 g/dL — ABNORMAL LOW (ref 13.0–17.7)
Immature Grans (Abs): 0 10*3/uL (ref 0.0–0.1)
Immature Granulocytes: 0 %
Lymphocytes Absolute: 2.4 10*3/uL (ref 0.7–3.1)
Lymphs: 21 %
MCH: 27.5 pg (ref 26.6–33.0)
MCHC: 33.1 g/dL (ref 31.5–35.7)
MCV: 83 fL (ref 79–97)
Monocytes Absolute: 1.1 10*3/uL — ABNORMAL HIGH (ref 0.1–0.9)
Monocytes: 10 %
Neutrophils Absolute: 7.4 10*3/uL — ABNORMAL HIGH (ref 1.4–7.0)
Neutrophils: 66 %
Platelets: 277 10*3/uL (ref 150–450)
RBC: 4.69 x10E6/uL (ref 4.14–5.80)
RDW: 13.6 % (ref 11.6–15.4)
WBC: 11.2 10*3/uL — ABNORMAL HIGH (ref 3.4–10.8)

## 2019-06-18 LAB — HEMOGLOBIN A1C
Est. average glucose Bld gHb Est-mCnc: 237 mg/dL
Hgb A1c MFr Bld: 9.9 % — ABNORMAL HIGH (ref 4.8–5.6)

## 2019-06-18 NOTE — Telephone Encounter (Signed)
06/18/2019 10:25:21 AM - Victoza & tips renewal to [place] - Friday, June 18, 2019 10:23 AM --[employer] a renewal for [employer] 1.8mg  once daily #4 boxes, and Novofine 32G tips #2 boxes.

## 2019-06-24 ENCOUNTER — Other Ambulatory Visit: Payer: Self-pay

## 2019-06-29 ENCOUNTER — Other Ambulatory Visit: Payer: Self-pay

## 2019-06-29 ENCOUNTER — Other Ambulatory Visit: Payer: Self-pay | Admitting: Endocrinology

## 2019-06-29 ENCOUNTER — Ambulatory Visit: Payer: Self-pay | Admitting: Endocrinology

## 2019-06-29 VITALS — BP 106/72 | HR 97 | Temp 98.3°F | Resp 16 | Ht 69.0 in | Wt 294.3 lb

## 2019-06-29 DIAGNOSIS — E118 Type 2 diabetes mellitus with unspecified complications: Secondary | ICD-10-CM

## 2019-06-29 DIAGNOSIS — N62 Hypertrophy of breast: Secondary | ICD-10-CM

## 2019-06-29 DIAGNOSIS — I10 Essential (primary) hypertension: Secondary | ICD-10-CM

## 2019-06-29 DIAGNOSIS — J449 Chronic obstructive pulmonary disease, unspecified: Secondary | ICD-10-CM

## 2019-06-29 DIAGNOSIS — N182 Chronic kidney disease, stage 2 (mild): Secondary | ICD-10-CM

## 2019-06-29 DIAGNOSIS — I5022 Chronic systolic (congestive) heart failure: Secondary | ICD-10-CM

## 2019-06-29 MED ORDER — BASAGLAR KWIKPEN 100 UNIT/ML ~~LOC~~ SOPN: 25.0000 [IU] | PEN_INJECTOR | Freq: Every day | SUBCUTANEOUS | 5 refills | Status: DC

## 2019-06-29 MED ORDER — BD PEN NEEDLE NANO U/F 32G X 4 MM MISC: 1.0000 | Freq: Every day | 5 refills | Status: DC

## 2019-06-29 NOTE — Progress Notes (Addendum)
Follow up Diabetes/ Endocrine [facility]     Patient ID: Bradley Aro., male   DOB: [birth year], 54 y.o.   MRN: [id number] Assessment:  Bradley Proch. is a 54 y.o. male who is seen in follow up for T2DM management at the request of [place], Chioma E, NP.  Encounter Diagnoses 1. Type 2 diabetes mellitus with complication [facility])     Assessment  Bradley Mckenzie is a 54yo man with PMH of T2DM, cardiomyopathy, obesity, hypertension, hyperlipidemia, and obstructive sleep apnea, presenting for follow-up for T2DM management. He has presumed T2DM but overall on low insulin requirement, so he could have insulin deficiency not only resistance. As such, therapies we may try will not decrease fasting blood glucose to desirable target <130. In addition to collecting BMP, LFTs, lipid, kidney, and thyroid, we will also send C-peptide to evaluate possible T1DM. His recent switch from Lantus to Glargine also reduced the number of insulin units to 15, but will increase this today to work toward fasting glucose of <130. We also recommend routine labs, especially given K+ wasting diuretic. On exam, thyroid was mildly enlarged, so thyroid panel is recommended.     Plan:    1. Continue on Farxiga 10mg , Metformin 500mg  BID, Victoza 1.8mg   2. Increase Glargine by 2 units for each day, until reaching 22 units/day.  Continue to increase glargine by 1-2 units per day  Until  Target is reached for AM blood glucose of  <130.  Max glargine dose 25 units/ day.  New script sent with pen needles.  3. Pt had a recent period of difficult- to control BG and it is unclear what caused this but numbers have improved (he is not on injected or oral steroids to account for this, he did not have recent infections, cautioned him that farxiga can rarely cause DKA and he should contact us if this happens again , as we may recommend to test urine for ketones). 4. BMP to check Ca, Potassium, electrolytes and add Magnesium 5. Check  thyroid panel Dt Thyromegaly (no prior personal Hx of thyroid dz).  6. Talk with pharmacy about switching Victoza to Ozempic to reduce daily injections to weekly. 7. C-peptide will serve as a screen for type 1 DM for today, but we may consider GAD antibody testing in the future.  8. Follow-up in 3 months.     Patient Instructions  Increase Basaglar by 2 units/day until reaching morning fasting glucose of <130. Maximum 25 units/day.    Orders Placed This Encounter  Procedures  . Hemoglobin A1c  . T3, free  . T4, free  . TSH  . ALT  . AST  . Basic metabolic panel  . Lipid panel  . Microalbumin / creatinine urine ratio  . C-peptide  . Magnesium     Subjective:  Bradley Mckenzie is a 54-yo man with a PMH of Asthma, Cardiomyopathy ([place]), Chronic combined systolic (congestive) and diastolic (congestive) heart failure ([place]), COPD (chronic obstructive pulmonary disease) ([place]), Depression, Diabetes mellitus without complication ([place]), Hyperlipidemia, Hypertension, Hypertensive heart disease, Morbid obesity ([place]), and Obstructive sleep apnea, presenting for follow-up for T2DM management. He reports blood glucose readings recently in the 130s-160s, down from 200-300s about a month ago. Lowest reading recently was 87 and no symptomatic lows. Currently he is managed with insulin (Glargine 15 units), Victoza 1.8mg , Metformin 1000mg  daily and Farxiga 10mg . He was able to obtain the [place] in March, and reports a brief period without Lantus insulin, after which his insulin  was switched to a new injectable insulin that he did not know the name of (Glargine per chart). He prefers to take insulin in the morning, as his wife assists with injections and this is better for her schedule. His A1c has increased to 9.9 in June, up from 9.0 in March and 8.5 six months ago.   Generally the patient reports frustration with all the paperwork required for patient care and prescriptions. He has not been able to complete  paperwork for nutrition consultation but would benefit from diet planning. He has not tried taking his blood sugar two hours after meals, or at any other point than the morning. He has been denied disability and Medicaid.  During the period of higher blood glucose readings about a month ago, Bradley Mckenzie reports he was getting up to go to the bathroom during the night frequently, but this has improved. He reports no major dietary or exercise changes that would have precipitated the higher glucose readings or polyuria. His heart failure doctor switched diuretics (Torsemide from Furosemide and off Spironolactone) recently.  He reports no changes in vision and no recent visit to the eye doctor. He has been told to complete paperwork for [facility] eye referral but has not been able to complete it. He reports mild numbness/tingling, usually when resting an arm on an armrest.    Review of Systems  Eyes: Negative for visual disturbance.  Endocrine: Positive for polyuria.  Neurological: Positive for numbness.  Mild occasional numbness reporting.  Family History, Social History, current Medications and allergies reviewed and updated in Epic.  Objective:    There were no vitals taken for this visit. Physical Exam Neck:     Thyroid: Thyromegaly present.  Cardiovascular:     Heart sounds: Normal heart sounds.  Pulmonary:     Effort: Pulmonary effort is normal. No tachypnea.     Breath sounds: Normal breath sounds.  Musculoskeletal:       Feet:  Feet:     Right foot:     Protective Sensation: 8 sites tested. 8 sites sensed.     Skin integrity: Skin integrity normal.     Left foot:     Protective Sensation: 8 sites tested. 7 sites sensed.     Skin integrity: Skin integrity normal.     Comments: Monofilament exam normal, except for first (big) toe on left foot had decreased sensitivity with mild pressure.        Data : I have personally reviewed pertinent labs and imaging studies, if indicated,   with the patient in clinic today.    Lab Orders     Hemoglobin A1c     T3, free     T4, free     TSH     ALT     AST     Basic metabolic panel     Lipid panel     Microalbumin / creatinine urine ratio     C-peptide     Magnesium  HC Readings from Last 3 Encounters:  No data found for [facility]    Wt Readings from Last 3 Encounters:  06/29/19 294 lb 4.8 oz (133.5 kg)  06/22/19 289 lb (131.1 kg)  04/01/19 293 lb 4.8 oz (133 kg)    Lab on 06/17/2019  Component Date Value Ref Range Status  . Hgb A1c MFr Bld 06/17/2019 9.9* 4.8 - 5.6 % Final   Comment:          Prediabetes: 5.7 - 6.4  Diabetes: >6.4          Glycemic control for adults with diabetes: <7.0   . Est. average glucose Bld gHb Est-m* 06/17/2019 237  mg/dL Final  . WBC 06/17/2019 11.2* 3.4 - 10.8 x10E3/uL Final  . RBC 06/17/2019 4.69  4.14 - 5.80 x10E6/uL Final  . Hemoglobin 06/17/2019 12.9* 13.0 - 17.7 g/dL Final  . Hematocrit 06/17/2019 39.0  37.5 - 51.0 % Final  . MCV 06/17/2019 83  79 - 97 fL Final  . MCH 06/17/2019 27.5  26.6 - 33.0 pg Final  . MCHC 06/17/2019 33.1  31 - 35 g/dL Final  . RDW 06/17/2019 13.6  11.6 - 15.4 % Final  . Platelets 06/17/2019 277  150 - 450 x10E3/uL Final  . Neutrophils 06/17/2019 66  Not Estab. % Final  . Lymphs 06/17/2019 21  Not Estab. % Final  . Monocytes 06/17/2019 10  Not Estab. % Final  . Eos 06/17/2019 2  Not Estab. % Final  . Basos 06/17/2019 1  Not Estab. % Final  . Neutrophils Absolute 06/17/2019 7.4* 1 - 7 x10E3/uL Final  . Lymphocytes Absolute 06/17/2019 2.4  0 - 3 x10E3/uL Final  . Monocytes Absolute 06/17/2019 1.1* 0 - 0 x10E3/uL Final  . EOS (ABSOLUTE) 06/17/2019 0.2  0.0 - 0.4 x10E3/uL Final  . Basophils Absolute 06/17/2019 0.1  0 - 0 x10E3/uL Final  . Immature Granulocytes 06/17/2019 0  Not Estab. % Final  . Immature Grans (Abs) 06/17/2019 0.0  0.0 - 0.1 x10E3/uL Final  Lab on 03/24/2019  Component Date Value Ref Range Status  . Hgb A1c MFr Bld 03/24/2019  9.0* 4.8 - 5.6 % Final   Comment:          Prediabetes: 5.7 - 6.4          Diabetes: >6.4          Glycemic control for adults with diabetes: <7.0   . Est. average glucose Bld gHb Est-m* 03/24/2019 212  mg/dL Final  . WBC 03/24/2019 10.1  3.4 - 10.8 x10E3/uL Final  . RBC 03/24/2019 4.26  4.14 - 5.80 x10E6/uL Final  . Hemoglobin 03/24/2019 11.9* 13.0 - 17.7 g/dL Final  . Hematocrit 03/24/2019 35.1* 37.5 - 51.0 % Final  . MCV 03/24/2019 82  79 - 97 fL Final  . MCH 03/24/2019 27.9  26.6 - 33.0 pg Final  . MCHC 03/24/2019 33.9  31 - 35 g/dL Final  . RDW 03/24/2019 14.9  11.6 - 15.4 % Final  . Platelets 03/24/2019 289  150 - 450 x10E3/uL Final  . Neutrophils 03/24/2019 64  Not Estab. % Final  . Lymphs 03/24/2019 22  Not Estab. % Final  . Monocytes 03/24/2019 9  Not Estab. % Final  . Eos 03/24/2019 4  Not Estab. % Final  . Basos 03/24/2019 1  Not Estab. % Final  . Neutrophils Absolute 03/24/2019 6.5  1 - 7 x10E3/uL Final  . Lymphocytes Absolute 03/24/2019 2.3  0 - 3 x10E3/uL Final  . Monocytes Absolute 03/24/2019 0.9  0 - 0 x10E3/uL Final  . EOS (ABSOLUTE) 03/24/2019 0.4  0.0 - 0.4 x10E3/uL Final  . Basophils Absolute 03/24/2019 0.1  0 - 0 x10E3/uL Final  . Immature Granulocytes 03/24/2019 0  Not Estab. % Final  . Immature Grans (Abs) 03/24/2019 0.0  0.0 - 0.1 x10E3/uL Final  Office Visit on 03/12/2019  Component Date Value Ref Range Status  . Glucose-Capillary 03/12/2019 181* 70 - 99 mg/dL Final   Glucose reference  range applies only to samples taken after fasting for at least 8 hours.  Office Visit on 12/29/2018  Component Date Value Ref Range Status  . Hemoglobin A1C 12/29/2018 8.5* 4.0 - 5.6 % Final

## 2019-06-29 NOTE — Patient Instructions (Signed)
Increase Basaglar by 2 units/day until reaching morning fasting glucose of <130. Maximum 25 units/day.

## 2019-06-30 ENCOUNTER — Encounter: Payer: Self-pay | Admitting: Endocrinology

## 2019-06-30 NOTE — Addendum Note (Signed)
Addended by: Austin Miles on: 06/30/2019 09:40 PM   Modules accepted: Orders

## 2019-07-01 ENCOUNTER — Ambulatory Visit: Payer: Self-pay

## 2019-07-06 ENCOUNTER — Telehealth: Payer: Self-pay | Admitting: Gerontology

## 2019-07-06 ENCOUNTER — Other Ambulatory Visit: Payer: Self-pay

## 2019-07-06 DIAGNOSIS — E118 Type 2 diabetes mellitus with unspecified complications: Secondary | ICD-10-CM

## 2019-07-06 MED ORDER — DAPAGLIFLOZIN PROPANEDIOL 10 MG PO TABS: 10.0000 mg | ORAL_TABLET | Freq: Every day | ORAL | 0 refills | Status: AC

## 2019-07-06 MED ORDER — METFORMIN HCL 500 MG PO TABS: 500.0000 mg | ORAL_TABLET | Freq: Two times a day (BID) | ORAL | 0 refills | Status: DC

## 2019-07-06 NOTE — Telephone Encounter (Addendum)
-----   Message from Leda Min, [place] sent at 07/06/2019 12:51 PM EDT ----- Regarding: lab appt Please call pt to make lab appt 1 week prior to Sept  Endo appt.   Pt did not not answer but I left a voicemail letting them know they have to set up a lab appt with [place] prior to endo appt.

## 2019-07-12 ENCOUNTER — Other Ambulatory Visit: Payer: Self-pay

## 2019-07-12 MED ORDER — OZEMPIC (0.25 OR 0.5 MG/DOSE) 2 MG/1.5ML ~~LOC~~ SOPN: PEN_INJECTOR | SUBCUTANEOUS | 3 refills | Status: DC

## 2019-07-13 NOTE — Telephone Encounter (Addendum)
Scheduled him an appt for labs on 9/15... he said earlier the better so might be showing up a little before his appt  ----- Message from Leda Min, [place] sent at 07/06/2019 12:51 PM EDT ----- Regarding: lab appt Please call pt to make lab appt 1 week prior to Sept  Endo appt.

## 2019-07-20 ENCOUNTER — Telehealth: Payer: Self-pay | Admitting: Pharmacist

## 2019-07-20 NOTE — Telephone Encounter (Signed)
07/20/2019 1:37:02 PM - Ozempic faxed to [place] - Tuesday, July 20, 2019 1:33 PM -- [employer] application for [employer] Inject 0.25mg  weekly(4 wks) then 0.5mg  weekly thereafter #4 boxes. This medication replaces Victoza per pharmacy printout.

## 2019-08-06 ENCOUNTER — Other Ambulatory Visit: Payer: Self-pay | Admitting: Urology

## 2019-08-06 ENCOUNTER — Other Ambulatory Visit: Payer: Self-pay | Admitting: Family

## 2019-08-13 ENCOUNTER — Telehealth: Payer: Self-pay | Admitting: Pharmacist

## 2019-08-13 NOTE — Telephone Encounter (Signed)
08/13/2019 12:05:07 PM - Call to [place] for [place] - Friday, August 13, 2019 12:01 PM --[employer] spoke with Loree Fee for [facility]- according to her they processed 07/22/2019--set up for delivery on Monday, Aug. 3. I have sent email to all staff on Ozempic patients and requested the pharmacy to let me know when received.

## 2019-08-16 ENCOUNTER — Ambulatory Visit: Payer: Self-pay | Attending: Family | Admitting: Family

## 2019-08-16 ENCOUNTER — Other Ambulatory Visit: Payer: Self-pay

## 2019-08-16 ENCOUNTER — Encounter: Payer: Self-pay | Admitting: Family

## 2019-08-16 VITALS — BP 135/67 | HR 104 | Resp 18 | Ht 69.0 in | Wt 290.5 lb

## 2019-08-16 DIAGNOSIS — Z79899 Other long term (current) drug therapy: Secondary | ICD-10-CM | POA: Insufficient documentation

## 2019-08-16 DIAGNOSIS — E785 Hyperlipidemia, unspecified: Secondary | ICD-10-CM | POA: Insufficient documentation

## 2019-08-16 DIAGNOSIS — Z794 Long term (current) use of insulin: Secondary | ICD-10-CM | POA: Insufficient documentation

## 2019-08-16 DIAGNOSIS — Z7901 Long term (current) use of anticoagulants: Secondary | ICD-10-CM | POA: Insufficient documentation

## 2019-08-16 DIAGNOSIS — I110 Hypertensive heart disease with heart failure: Secondary | ICD-10-CM | POA: Insufficient documentation

## 2019-08-16 DIAGNOSIS — I10 Essential (primary) hypertension: Secondary | ICD-10-CM

## 2019-08-16 DIAGNOSIS — I429 Cardiomyopathy, unspecified: Secondary | ICD-10-CM | POA: Insufficient documentation

## 2019-08-16 DIAGNOSIS — R140 Abdominal distension (gaseous): Secondary | ICD-10-CM | POA: Insufficient documentation

## 2019-08-16 DIAGNOSIS — Z8249 Family history of ischemic heart disease and other diseases of the circulatory system: Secondary | ICD-10-CM | POA: Insufficient documentation

## 2019-08-16 DIAGNOSIS — I5032 Chronic diastolic (congestive) heart failure: Secondary | ICD-10-CM

## 2019-08-16 DIAGNOSIS — M542 Cervicalgia: Secondary | ICD-10-CM | POA: Insufficient documentation

## 2019-08-16 DIAGNOSIS — Z7982 Long term (current) use of aspirin: Secondary | ICD-10-CM | POA: Insufficient documentation

## 2019-08-16 DIAGNOSIS — R5383 Other fatigue: Secondary | ICD-10-CM | POA: Insufficient documentation

## 2019-08-16 DIAGNOSIS — R0602 Shortness of breath: Secondary | ICD-10-CM | POA: Insufficient documentation

## 2019-08-16 DIAGNOSIS — Z87891 Personal history of nicotine dependence: Secondary | ICD-10-CM | POA: Insufficient documentation

## 2019-08-16 DIAGNOSIS — I5042 Chronic combined systolic (congestive) and diastolic (congestive) heart failure: Secondary | ICD-10-CM | POA: Insufficient documentation

## 2019-08-16 DIAGNOSIS — J449 Chronic obstructive pulmonary disease, unspecified: Secondary | ICD-10-CM | POA: Insufficient documentation

## 2019-08-16 DIAGNOSIS — E119 Type 2 diabetes mellitus without complications: Secondary | ICD-10-CM | POA: Insufficient documentation

## 2019-08-16 NOTE — Progress Notes (Signed)
Patient ID: Bradley Taves., male    DOB: 03-28-1965, 54 y.o.   MRN: [id number]  HPI  Bradley Mckenzie is a 54 y/o male who has a history of diabetes, HTN, obstructive sleep apnea, depression, hyperlipidemia, morbid obesity and heart failure with a reduced ejection fraction. Remote tobacco exposure.   Echo report from 10/28/2018 reviewed and showed an EF of 60-65% along with mildly elevated PA pressure. Echo done 09/02/15 with an EF of 30-35%, mild mitral regurgitation and no aortic stenosis. EF has declined from 40-45% in March 2017 but he had been inconsistent taking his medications & not using his CPAP during this time.  Has not been admitted or been in the ED in the last 6 months.   Returns today for a follow-up visit with a chief complaint of moderate shortness of breath upon moderate exertion. Says that this has been chronic in nature having been present for several years. He has associated fatigue, abdominal distention and neck pain along with this. He denies any difficulty sleeping, dizziness, palpitations, pedal edema, chest pain, wheezing, cough or weight gain.   Biggest issue today is of right sided neck pain. Says that his shoulder started hurting a few days ago and then his neck started hurting yesterday. Shoulder pain is much improved but neck is very sore with movement. Just got a heating pad to start using.   Waiting to get ozempic from medication management company.   Past Medical History:  Diagnosis Date  . Asthma   . Cardiomyopathy ([place]) 2016   a. 01/2014 Echo: EF 25-30%;  b. ? ischemic vs non-ischemic.  He's never had an ischemic eval.  . Chronic combined systolic (congestive) and diastolic (congestive) heart failure ([place])    a. 01/2014 Echo: EF 25-30%. b. Echo 03/2015: Improved EF of 40-45%  . COPD (chronic obstructive pulmonary disease) ([place])   . Depression   . Diabetes mellitus without complication ([place]) 4481   started med therapy approx 2018  . Hyperlipidemia   .  Hypertensive cardiomyopathy ([place]) 06/30/2015  . Hypertensive heart disease    a. Since his 13's.  . Morbid obesity ([place])   . Obstructive sleep apnea    a. Has not used CPAP since ~ 2012.   Past Surgical History:  Procedure Laterality Date  . HERNIA REPAIR  2004   Family History  Problem Relation Age of Onset  . Heart disease Mother        alive & well.  [name] Heart attack Father 80       died @ 104 of cancer.  . Diabetes Father   . Cancer Father        "in his stomach"  . Cancer Sister   . Cancer Paternal Uncle        strong FH malignancy multiple relavies    Social History   Tobacco Use  . Smoking status: Former Research scientist (life sciences)  . Smokeless tobacco: Never Used  . Tobacco comment: Quit in his 79's.  Substance Use Topics  . Alcohol use: Not Currently    Alcohol/week: 0.0 standard drinks    Comment: occassional alcohol use at special events   Past Medical History:  Diagnosis Date  . Asthma   . Cardiomyopathy ([place]) 2016   a. 01/2014 Echo: EF 25-30%;  b. ? ischemic vs non-ischemic.  He's never had an ischemic eval.  . Chronic combined systolic (congestive) and diastolic (congestive) heart failure ([place])    a. 01/2014 Echo: EF 25-30%. b. Echo 03/2015: Improved EF of 40-45%  .  COPD (chronic obstructive pulmonary disease) ([place])   . Depression   . Diabetes mellitus without complication ([place]) 7001   started med therapy approx 2018  . Hyperlipidemia   . Hypertensive cardiomyopathy ([place]) 06/30/2015  . Hypertensive heart disease    a. Since his 69's.  . Morbid obesity ([place])   . Obstructive sleep apnea    a. Has not used CPAP since ~ 2012.   Past Surgical History:  Procedure Laterality Date  . HERNIA REPAIR  2004   Family History  Problem Relation Age of Onset  . Heart disease Mother        alive & well.  [name] Heart attack Father 33       died @ 24 of cancer.  . Diabetes Father   . Cancer Father        "in his stomach"  . Cancer Sister   . Cancer Paternal Uncle        strong FH  malignancy multiple relavies    Social History   Tobacco Use  . Smoking status: Former Research scientist (life sciences)  . Smokeless tobacco: Never Used  . Tobacco comment: Quit in his 74's.  Substance Use Topics  . Alcohol use: Not Currently    Alcohol/week: 0.0 standard drinks    Comment: occassional alcohol use at special events   No Known Allergies  Prior to Admission medications   Medication Sig Start Date End Date Taking? Authorizing Provider  albuterol (VENTOLIN HFA) 108 (90 Base) MCG/ACT inhaler Inhale 2 puffs into the lungs every 6 (six) hours as needed for wheezing or shortness of breath. 06/04/18  Yes McGowan, Larene Beach A, PA-C  aspirin 81 MG EC tablet TAKE ONE TABLET BY MOUTH EVERY DAY 08/06/19  Yes Darylene Price A, FNP  atorvastatin (LIPITOR) 10 MG tablet TAKE ONE TABLET BY MOUTH EVERY DAY 05/07/19  Yes Viktoriya Glaspy A, FNP  carvedilol (COREG) 12.5 MG tablet TAKE ONE TABLET BY MOUTH 2 TIMES A DAY WITH A MEAL. 02/11/19  Yes Shanesha Bednarz, Otila Kluver A, FNP  cetirizine (ZYRTEC) 10 MG tablet Take 1 tablet (10 mg total) by mouth daily. Future requests need to go to [facility] 08/06/19  Yes Darylene Price A, FNP  dapagliflozin propanediol (FARXIGA) 10 MG TABS tablet Take 1 tablet (10 mg total) by mouth daily. 07/06/19 10/04/19 Yes Styner, Netty Starring, MD  Fluticasone-Salmeterol (ADVAIR) 250-50 MCG/DOSE AEPB Inhale 1 puff into the lungs 2 (two) times daily. 04/03/17  Yes Doles-Johnson, Teah, NP  hydrALAZINE (APRESOLINE) 25 MG tablet TAKE ONE TABLET BY MOUTH 2 TIMES A DAY 02/11/19  Yes Margaret Staggs A, FNP  Insulin Glargine (BASAGLAR KWIKPEN) 100 UNIT/ML Inject 0.25 mLs (25 Units total) into the skin daily. Patient taking differently: Inject 22 Units into the skin daily.  06/29/19 12/26/19 Yes Styner, Netty Starring, MD  Insulin Pen Needle (BD PEN NEEDLE NANO U/F) 32G X 4 MM MISC 1 Device by Does not apply route daily. 06/29/19 06/28/20 Yes Styner, Netty Starring, MD  ipratropium-albuterol (DUONEB) 0.5-2.5 (3) MG/3ML SOLN INHALE CONTENTS OF ONE VIAL  USING NEBULIZER EVERY 4 HOURS AS NEEDED 05/03/19  Yes McGowan, Larene Beach A, PA-C  liraglutide (VICTOZA) 18 MG/3ML SOPN Inject 0.3 mLs (1.8 mg total) into the skin 1 day or 1 dose for 1 dose. 06/30/18 08/16/19 Yes Barnet Pall, MD  losartan (COZAAR) 100 MG tablet TAKE ONE TABLET BY MOUTH EVERY DAY 05/06/19  Yes Darylene Price A, FNP  metFORMIN (GLUCOPHAGE) 500 MG tablet Take 1 tablet (500 mg total) by mouth 2 (  two) times daily with a meal. 07/06/19 10/04/19 Yes Styner, Netty Starring, MD  potassium chloride SA (KLOR-CON) 20 MEQ tablet TAKE ONE TABLET BY MOUTH 2 TIMES A DAY 08/06/19  Yes Darylene Price A, FNP  tadalafil (CIALIS) 20 MG tablet Take 1 tablet (20 mg total) by mouth daily as needed for erectile dysfunction. 08/06/17  Yes McGowan, Larene Beach A, PA-C  torsemide (DEMADEX) 20 MG tablet Take 2 tablets (40 mg total) by mouth 2 (two) times daily. 03/12/19 08/16/19 Yes Vannesa Abair, Otila Kluver A, FNP  Semaglutide,0.25 or 0.5MG /DOS, (OZEMPIC, 0.25 OR 0.5 MG/DOSE,) 2 MG/1.5ML SOPN Inject 0.25mg  into skin weekly for 4 weeks. After 4 weeks, inject 0.5mg  into skin weekly. Patient not taking: Reported on 08/16/2019 07/12/19   Lanae Boast, FNP     Review of Systems  Constitutional: Positive for fatigue. Negative for appetite change.  HENT: Positive for congestion and postnasal drip. Negative for sore throat.   Eyes: Negative.   Respiratory: Positive for shortness of breath (with moderate exertion). Negative for cough, chest tightness and wheezing.   Cardiovascular: Negative for chest pain, palpitations and leg swelling.  Gastrointestinal: Positive for abdominal distention. Negative for abdominal pain.  Endocrine: Negative.   Genitourinary: Negative.   Musculoskeletal: Positive for arthralgias (right shoulder) and neck pain (right neck pain). Negative for back pain.  Skin: Negative.   Allergic/Immunologic: Negative.   Neurological: Negative for dizziness and light-headedness.  Hematological: Negative for adenopathy. Does not  bruise/bleed easily.  Psychiatric/Behavioral: Negative for dysphoric mood, sleep disturbance and suicidal ideas. The patient is not nervous/anxious.    Vitals:   08/16/19 1033  BP: (!) 135/67  Pulse: 104  Resp: 18  SpO2: 96%  Weight: (!) 290 lb 8 oz (131.8 kg)  Height: 5\' 9"  (1.753 m)   Wt Readings from Last 3 Encounters:  08/16/19 (!) 290 lb 8 oz (131.8 kg)  06/29/19 294 lb 4.8 oz (133.5 kg)  06/22/19 289 lb (131.1 kg)   Lab Results  Component Value Date   CREATININE 1.17 07/23/2018   CREATININE 1.33 (H) 11/11/2017   CREATININE 1.25 08/05/2017    Physical Exam Vitals and nursing note reviewed.  Constitutional:      Appearance: He is well-developed.  HENT:     Head: Normocephalic and atraumatic.  Neck:     Vascular: No JVD.  Cardiovascular:     Rate and Rhythm: Normal rate and regular rhythm.  Pulmonary:     Effort: Pulmonary effort is normal.     Breath sounds: No wheezing or rales.  Abdominal:     General: There is no distension.     Palpations: Abdomen is soft.     Tenderness: There is no abdominal tenderness.  Musculoskeletal:        General: No tenderness.     Cervical back: Neck supple. Muscular tenderness (right side of neck) present.     Right lower leg: No edema.     Left lower leg: No edema.  Skin:    General: Skin is warm and dry.  Neurological:     Mental Status: He is alert and oriented to person, place, and time.  Psychiatric:        Behavior: Behavior normal.     Assessment & Plan:  1: Chronic heart failure with now preserved ejection fraction-  - NYHA Class II - euvolemic today - weighing daily and he was reminded to call for an overnight weight gain of >2 pounds or a weekly weight gain of >5 pounds - weight down  1 pound since last here 5 months ago - trying to eat low sodium foods when he can but says it's difficult at times due to finances - reports receiving both COVID vaccines - has not taken his diuretic yet today but will do so  upon return home  2: HTN- - BP looks good today - went to [facility] on 06/29/19 - BMP on 07/23/2018 reviewed and shows sodium 140, potassium 3.8, creatinine 1.17 and GFR 83  3: Diabetes- - fasting glucose at home this morning was 124; waiting on ozempic from the company - A1c on 12/29/2018 was 8.5%  4: Neck pain- - just recently got a heating pad - can also try OTC bengay or salonpas patches but not to place heating pad directly over applying those - follow-up with PCP if pain continues or any numbness/tingling occurs   Patient did not bring his medications nor a list. Each medication was verbally reviewed with the patient and he was encouraged to bring the bottles to every visit to confirm accuracy of list.  Due to HF stability, will not make a return appointment for patient at this time. Advised patient that he could call back at anytime to schedule another appointment and he was comfortable with this plan.

## 2019-08-16 NOTE — Patient Instructions (Addendum)
Continue weighing daily and call for an overnight weight gain of > 2 pounds or a weekly weight gain of >5 pounds.   Call us in the future if you'd like to schedule another appointment 

## 2019-08-26 ENCOUNTER — Other Ambulatory Visit: Payer: Self-pay

## 2019-08-26 DIAGNOSIS — N529 Male erectile dysfunction, unspecified: Secondary | ICD-10-CM

## 2019-09-08 ENCOUNTER — Telehealth: Payer: Self-pay | Admitting: Pharmacist

## 2019-09-08 NOTE — Telephone Encounter (Signed)
09/08/2019 10:40:36 AM - Refilled Advair & Ventolin online with [facility]  -- Elmer Picker - Wednesday, September 08, 2019 10:39 AM --Placed refill online with [facility] for Ventolin & Advair to ship 09/22/2019, order# [id number].

## 2019-09-23 ENCOUNTER — Other Ambulatory Visit: Payer: Self-pay | Admitting: Gerontology

## 2019-09-23 ENCOUNTER — Other Ambulatory Visit: Payer: Self-pay

## 2019-09-23 DIAGNOSIS — E118 Type 2 diabetes mellitus with unspecified complications: Secondary | ICD-10-CM

## 2019-09-23 MED ORDER — BASAGLAR KWIKPEN 100 UNIT/ML ~~LOC~~ SOPN: 25.0000 [IU] | PEN_INJECTOR | Freq: Every day | SUBCUTANEOUS | 5 refills | Status: DC

## 2019-09-24 ENCOUNTER — Telehealth: Payer: Self-pay | Admitting: Pharmacist

## 2019-09-24 NOTE — Telephone Encounter (Signed)
09/24/2019 8:08:19 AM - Lantus Solostar refill to [facility] - Friday, September 24, 2019 8:07 AM --Virgel Gess [employer] refill request for Lantus Solostar Max 22 units every day # 2 boxes.

## 2019-09-29 ENCOUNTER — Other Ambulatory Visit: Payer: Self-pay

## 2019-09-30 ENCOUNTER — Ambulatory Visit: Payer: Self-pay | Admitting: Family Medicine

## 2019-09-30 ENCOUNTER — Other Ambulatory Visit: Payer: Self-pay

## 2019-09-30 VITALS — BP 133/81 | HR 103 | Ht 69.0 in | Wt 290.0 lb

## 2019-09-30 DIAGNOSIS — E118 Type 2 diabetes mellitus with unspecified complications: Secondary | ICD-10-CM

## 2019-09-30 DIAGNOSIS — J449 Chronic obstructive pulmonary disease, unspecified: Secondary | ICD-10-CM

## 2019-09-30 DIAGNOSIS — J301 Allergic rhinitis due to pollen: Secondary | ICD-10-CM

## 2019-09-30 MED ORDER — MONTELUKAST SODIUM 10 MG PO TABS: 10.0000 mg | ORAL_TABLET | Freq: Every day | ORAL | 2 refills | Status: DC

## 2019-09-30 MED ORDER — FLUTICASONE-SALMETEROL 250-50 MCG/DOSE IN AEPB: 1.0000 | INHALATION_SPRAY | Freq: Two times a day (BID) | RESPIRATORY_TRACT | 3 refills | Status: DC

## 2019-09-30 MED ORDER — ALBUTEROL SULFATE HFA 108 (90 BASE) MCG/ACT IN AERS: 2.0000 | INHALATION_SPRAY | Freq: Four times a day (QID) | RESPIRATORY_TRACT | 2 refills | Status: DC | PRN

## 2019-09-30 NOTE — Progress Notes (Signed)
[facility]   Progress Note: General Provider: Lanae Boast, FNP  SUBJECTIVE:   Bradley Mckenzie. is a 54 y.o. male who  has a past medical history of Asthma, Cardiomyopathy ([place]) (2016), Chronic combined systolic (congestive) and diastolic (congestive) heart failure ([place]), COPD (chronic obstructive pulmonary disease) ([place]), Depression, Diabetes mellitus without complication ([place]) (2671), Hyperlipidemia, Hypertensive cardiomyopathy ([place]) (06/30/2015), Hypertensive heart disease, Morbid obesity ([place]), and Obstructive sleep apnea.. Patient presents today for Allergies (wants to change up) Patient states that he is currently taking zyrtec without relief of symptoms. Patient states that he has head and chest congestion daily. He denies trying other otc medications. Cannot tolerate flonase. Hx of COPD and allergic rhinitis. Having difficulty with using CPAP due to ill fitting mask.   Review of Systems  Constitutional: Negative.   HENT: Negative.   Eyes: Negative.   Respiratory: Negative.   Cardiovascular: Negative.   Gastrointestinal: Negative.   Genitourinary: Negative.   Musculoskeletal: Negative.   Skin: Negative.   Neurological: Negative.   Psychiatric/Behavioral: Negative.      OBJECTIVE: BP 133/81 (BP Location: Left Arm, Patient Position: Sitting)   Pulse (!) 103   Ht 5\' 9"  (1.753 m)   Wt 290 lb (131.5 kg)   SpO2 95%   BMI 42.83 kg/m   Wt Readings from Last 3 Encounters:  09/30/19 290 lb (131.5 kg)  08/16/19 (!) 290 lb 8 oz (131.8 kg)  06/29/19 294 lb 4.8 oz (133.5 kg)     Physical Exam Vitals and nursing note reviewed.  Constitutional:      General: He is not in acute distress.    Appearance: He is well-developed.  HENT:     Head: Normocephalic and atraumatic.  Eyes:     Conjunctiva/sclera: Conjunctivae normal.     Pupils: Pupils are equal, round, and reactive to light.  Cardiovascular:     Rate and Rhythm: Normal rate and regular rhythm.      Heart sounds: Normal heart sounds.  Pulmonary:     Effort: Pulmonary effort is normal. No respiratory distress.     Breath sounds: Normal breath sounds.  Abdominal:     General: Bowel sounds are normal. There is no distension.     Palpations: Abdomen is soft.  Musculoskeletal:        General: Normal range of motion.     Cervical back: Normal range of motion.  Skin:    General: Skin is warm and dry.  Neurological:     Mental Status: He is alert and oriented to person, place, and time.  Psychiatric:        Behavior: Behavior normal.        Thought Content: Thought content normal.     ASSESSMENT/PLAN:   1. Seasonal allergic rhinitis due to pollen - montelukast (SINGULAIR) 10 MG tablet; Take 1 tablet (10 mg total) by mouth at bedtime.  Dispense: 30 tablet; Refill: 2  2. Chronic obstructive pulmonary disease, unspecified COPD type (HCC) - Fluticasone-Salmeterol (ADVAIR) 250-50 MCG/DOSE AEPB; Inhale 1 puff into the lungs 2 (two) times daily.  Dispense: 60 each; Refill: 3 - albuterol (VENTOLIN HFA) 108 (90 Base) MCG/ACT inhaler; Inhale 2 puffs into the lungs every 6 (six) hours as needed for wheezing or shortness of breath.  Dispense: 18 g; Refill: 2    Return if symptoms worsen or fail to improve, for already scheduled with endocrin.    The patient was given clear instructions to go to ER or return to medical center  if symptoms do not improve, worsen or new problems develop. The patient verbalized understanding and agreed with plan of care.   Ms. Doug Sou. Nathaneil Canary, FNP-BC [place]

## 2019-09-30 NOTE — Patient Instructions (Signed)
Allergic Rhinitis, Adult Allergic rhinitis is a reaction to allergens in the air. Allergens are tiny specks (particles) in the air that cause your body to have an allergic reaction. This condition cannot be passed from person to person (is not contagious). Allergic rhinitis cannot be cured, but it can be controlled. There are two types of allergic rhinitis:  Seasonal. This type is also called hay fever. It happens only during certain times of the year.  Perennial. This type can happen at any time of the year. What are the causes? This condition may be caused by:  Pollen from grasses, trees, and weeds.  House dust mites.  Pet dander.  Mold. What are the signs or symptoms? Symptoms of this condition include:  Sneezing.  Runny or stuffy nose (nasal congestion).  A lot of mucus in the back of the throat (postnasal drip).  Itchy nose.  Tearing of the eyes.  Trouble sleeping.  Being sleepy during day. How is this treated? There is no cure for this condition. You should avoid things that trigger your symptoms (allergens). Treatment can help to relieve symptoms. This may include:  Medicines that block allergy symptoms, such as antihistamines. These may be given as a shot, nasal spray, or pill.  Shots that are given until your body becomes less sensitive to the allergen (desensitization).  Stronger medicines, if all other treatments have not worked. Follow these instructions at home: Avoiding allergens   Find out what you are allergic to. Common allergens include smoke, dust, and pollen.  Avoid them if you can. These are some of the things that you can do to avoid allergens: ? Replace carpet with wood, tile, or vinyl flooring. Carpet can trap dander and dust. ? Clean any mold found in the home. ? Do not smoke. Do not allow smoking in your home. ? Change your heating and air conditioning filter at least once a month. ? During allergy season:  Keep windows closed as much as  you can. If possible, use air conditioning when there is a lot of pollen in the air.  Use a special filter for allergies with your furnace and air conditioner.  Plan outdoor activities when pollen counts are lowest. This is usually during the early morning or evening hours.  If you do go outdoors when pollen count is high, wear a special mask for people with allergies.  When you come indoors, take a shower and change your clothes before sitting on furniture or bedding. General instructions  Do not use fans in your home.  Do not hang clothes outside to dry.  Wear sunglasses to keep pollen out of your eyes.  Wash your hands right away after you touch household pets.  Take over-the-counter and prescription medicines only as told by your doctor.  Keep all follow-up visits as told by your doctor. This is important. Contact a doctor if:  You have a fever.  You have a cough that does not go away (is persistent).  You start to make whistling sounds when you breathe (wheeze).  Your symptoms do not get better with treatment.  You have thick fluid coming from your nose.  You start to have nosebleeds. Get help right away if:  Your tongue or your lips are swollen.  You have trouble breathing.  You feel dizzy or you feel like you are going to pass out (faint).  You have cold sweats. Summary  Allergic rhinitis is a reaction to allergens in the air.  This condition may be   caused by allergens. These include pollen, dust mites, pet dander, and mold.  Symptoms include a runny, itchy nose, sneezing, or tearing eyes. You may also have trouble sleeping or feel sleepy during the day.  Treatment includes taking medicines and avoiding allergens. You may also get shots or take stronger medicines.  Get help if you have a fever or a cough that does not stop. Get help right away if you are short of breath. This information is not intended to replace advice given to you by your health care  provider. Make sure you discuss any questions you have with your health care provider. Document Revised: 04/21/2018 Document Reviewed: 07/22/2017 Elsevier Patient Education  2020 Elsevier Inc.  

## 2019-10-01 LAB — BASIC METABOLIC PANEL
BUN/Creatinine Ratio: 8 — ABNORMAL LOW (ref 9–20)
BUN: 14 mg/dL (ref 6–24)
CO2: 25 mmol/L (ref 20–29)
Calcium: 9.6 mg/dL (ref 8.7–10.2)
Chloride: 95 mmol/L — ABNORMAL LOW (ref 96–106)
Creatinine, Ser: 1.74 mg/dL — ABNORMAL HIGH (ref 0.76–1.27)
GFR calc Af Amer: 51 mL/min/{1.73_m2} — ABNORMAL LOW (ref >59)
GFR calc non Af Amer: 44 mL/min/{1.73_m2} — ABNORMAL LOW (ref >59)
Glucose: 232 mg/dL — ABNORMAL HIGH (ref 65–99)
Potassium: 3.8 mmol/L (ref 3.5–5.2)
Sodium: 137 mmol/L (ref 134–144)

## 2019-10-01 LAB — T3, FREE: T3, Free: 3.3 pg/mL (ref 2.0–4.4)

## 2019-10-01 LAB — HEMOGLOBIN A1C
Est. average glucose Bld gHb Est-mCnc: 217 mg/dL
Hgb A1c MFr Bld: 9.2 % — ABNORMAL HIGH (ref 4.8–5.6)

## 2019-10-01 LAB — AST: AST: 18 IU/L (ref 0–40)

## 2019-10-01 LAB — MICROALBUMIN / CREATININE URINE RATIO
Creatinine, Urine: 22.8 mg/dL
Microalb/Creat Ratio: <13 mg/g creat (ref 0–29)
Microalbumin, Urine: <3 ug/mL

## 2019-10-01 LAB — T4, FREE: Free T4: 1.05 ng/dL (ref 0.82–1.77)

## 2019-10-01 LAB — LIPID PANEL
Chol/HDL Ratio: 3.8 ratio (ref 0.0–5.0)
Cholesterol, Total: 167 mg/dL (ref 100–199)
HDL: 44 mg/dL (ref >39)
LDL Chol Calc (NIH): 88 mg/dL (ref 0–99)
Triglycerides: 210 mg/dL — ABNORMAL HIGH (ref 0–149)
VLDL Cholesterol Cal: 35 mg/dL (ref 5–40)

## 2019-10-01 LAB — MAGNESIUM: Magnesium: 1.9 mg/dL (ref 1.6–2.3)

## 2019-10-01 LAB — TSH: TSH: 0.817 u[IU]/mL (ref 0.450–4.500)

## 2019-10-01 LAB — ALT: ALT: 26 IU/L (ref 0–44)

## 2019-10-05 ENCOUNTER — Other Ambulatory Visit: Payer: Self-pay

## 2019-10-05 ENCOUNTER — Ambulatory Visit: Payer: Self-pay

## 2019-10-12 NOTE — Progress Notes (Unsigned)
Follow up Diabetes/ Endocrine [facility]     Patient ID: Bradley Mitton., male   DOB: [birth year], 54 y.o.   MRN: [id number] Assessment:  Bradley Schubert. is a 54 y.o. male who is seen in follow up for No primary diagnosis found. at the request of [place], Chioma E, NP.  Encounter Diagnoses No diagnosis found.  Assessment    Plan:        There are no Patient Instructions on file for this visit.  No orders of the defined types were placed in this encounter.    Subjective:  HPI    Review of Systems  Endocrine: Positive for polydipsia and polyuria.    Bradley Gae Gallop.  has a past medical history of Asthma, Cardiomyopathy ([place]) (2016), Chronic combined systolic (congestive) and diastolic (congestive) heart failure ([place]), COPD (chronic obstructive pulmonary disease) ([place]), Depression, Diabetes mellitus without complication ([place]) (9628), Hyperlipidemia, Hypertensive cardiomyopathy ([place]) (06/30/2015), Hypertensive heart disease, Morbid obesity ([place]), and Obstructive sleep apnea.  Family History, Social History, current Medications and allergies reviewed and updated in Epic.  Objective:    Blood pressure 105/68, pulse (!) 102, height 5\' 9"  (1.753 m), weight 287 lb 3.2 oz (130.3 kg), SpO2 95 %. Physical Exam Constitutional:      General: He is not in acute distress.    Appearance: Normal appearance.  HENT:     Head: Normocephalic and atraumatic.  Cardiovascular:     Rate and Rhythm: Normal rate.  Pulmonary:     Effort: Pulmonary effort is normal.     Breath sounds: Normal breath sounds.  Abdominal:     General: Bowel sounds are normal.     Palpations: Abdomen is soft.  Skin:    Findings: No bruising or lesion.  Neurological:     Mental Status: He is alert.   No striae on abdomen      Data : I have personally reviewed pertinent labs and imaging studies, if indicated,  with the patient in clinic today.   Lab Orders  No laboratory test(s) ordered today     HC Readings from Last 3 Encounters:  No data found for [facility]    Wt Readings from Last 3 Encounters:  10/05/19 287 lb 3.2 oz (130.3 kg)  09/30/19 290 lb (131.5 kg)  08/16/19 (!) 290 lb 8 oz (131.8 kg)

## 2019-11-16 ENCOUNTER — Other Ambulatory Visit: Payer: Self-pay | Admitting: Family

## 2019-11-16 ENCOUNTER — Other Ambulatory Visit: Payer: Self-pay | Admitting: Gerontology

## 2019-11-16 DIAGNOSIS — I5032 Chronic diastolic (congestive) heart failure: Secondary | ICD-10-CM

## 2019-11-17 ENCOUNTER — Other Ambulatory Visit: Payer: Self-pay | Admitting: Gerontology

## 2019-11-18 ENCOUNTER — Other Ambulatory Visit: Payer: Self-pay

## 2019-11-18 DIAGNOSIS — J449 Chronic obstructive pulmonary disease, unspecified: Secondary | ICD-10-CM

## 2019-11-18 NOTE — Progress Notes (Unsigned)
a1c

## 2019-11-19 ENCOUNTER — Telehealth: Payer: Self-pay | Admitting: Family

## 2019-11-19 LAB — HEMOGLOBIN A1C
Est. average glucose Bld gHb Est-mCnc: 246 mg/dL
Hgb A1c MFr Bld: 10.2 % — ABNORMAL HIGH (ref 4.8–5.6)

## 2019-11-19 NOTE — Telephone Encounter (Signed)
Returned patient's call regarding his diuretic usage. He is wanting to know if he can reduce his daily diuretic use. He's currently taking 40mg  torsemide BID. He says that he's "constantly" using the bathroom and that his mouth is dry. He says that his weight stays between 289-293 pounds.   Advised patient that he could reduce his torsemide to 40mg  QAM and should he have an overnight weight gain of >2 pounds or a weekly weight gain of >5 pounds or worsening swelling, he can take additional 20-40mg  at that time. Patient verbalized understanding.

## 2019-11-30 ENCOUNTER — Ambulatory Visit: Payer: Self-pay | Admitting: Internal Medicine

## 2019-11-30 ENCOUNTER — Other Ambulatory Visit: Payer: Self-pay

## 2019-11-30 VITALS — BP 140/81 | HR 89 | Ht 69.0 in | Wt 287.3 lb

## 2019-11-30 DIAGNOSIS — Z794 Long term (current) use of insulin: Secondary | ICD-10-CM

## 2019-11-30 DIAGNOSIS — E1165 Type 2 diabetes mellitus with hyperglycemia: Secondary | ICD-10-CM

## 2019-11-30 NOTE — Progress Notes (Addendum)
Follow up Diabetes/ [facility]     Patient ID: Bradley Satterly., male   DOB: 25-Sep-1965, 54 y.o.   MRN: [id number] Assessment/Plan:  Bradley Maus. is a 54 y.o. male who is seen in follow up for T2DM at the request of Iloabachie, Chioma E, NP.  His HbA1c rose from 9.2 to 10.2, which promoted a visit today. He has run out of his Metformin and recently increased in his Ozempic to .5mg  on 10/09/19. Given his GFR decrease from 72% on 07/23/18 to 44% on 09/30/19, cardiology decreased his dose of torsemide to decrease his fluid loss and stress on his kidneys. The decrease in torsemide helped to reduce polyuria symptoms and kidney pain. Due to the decrease in GFR, we counseled to decrease his dose of Metformin to one pill in the morning and one in the evening to prevent lactic acidosis. To move his HbA1c closer to goal, he is to increase his Ozempic dose from .5mg  to 1mg  the week of November 22nd, increase his units of Basaglar from 22 to 25 units and continue [place]. He will pick up more test strips from [facility] to maintain his blood glucose management. He will see [place] for a follow up in 3 months.     Patient Instructions  1. Take metformin one pill in morning and one in evening  2. Increase Ozempic from .5mg   to 1mg  during the week of November 22nd 3. Increase 22 to 25 units of basaglar and continue Farxiga 4. Obtain more glucose test strips form medical management  5. Schedule eye exam 6. Fill out [facility] application, mail it in and then call [employer] to confirm 7. Follow up in 3 months    I agree with the assessment and plan as documented in the student's note. Patient was advised of potential benefits/risks/SE of therapy and reasons to contact us before next visit. Julio Sicks [place]  No orders of the defined types were placed in this encounter.    Subjective:  Bradley Mckenzie is a 54 year old male presenting for a T2DM follow up with a HbA1C that  rose from 9.2 on 9/16 to 10.2 on 11/14 and a UCR at < 13 on 09/30/19. He has been feeling good in general, but has been having problems with affording medications and test strips. In terms of medications, he has run out of his Metformin 500mg  BID. He recently increased his dose of Ozempic on 10/09/19 to .5mg  and checked his HbA1c on 11/28/19. He has been taking his medication in 3 month increments in pill trays, which helps him organize his medications. He sometimes forgets his medications in the mornings, but it is not a usual occurrence. His blood sugar is checked once a day before eating and averages 120-170 mg/dl. He has been having some issues with refilling test strips, where he is charged an extra $5 if asking for more. He checks his blood pressure about once a week, which averages 118/91. He checks his weight infrequently, but has been checking for the last few days where his weight ranges from 286-290lbs.He does not check his feet regularly, but recently his wife checked his foot since he felt a paper cut like pain on bottom of foot. No deformities were observed when his wife checked. He has been having polydipsia, which has decreased after decreasing his fluid intake to 5 bottles of water and lowering dose of toresemide recently. His frequency in urination has decreased from every 10 minutes  to every 30 minutes and his nocturia has decreased to once during night. He has had sometimes where it is harder to swallow food and things stuck in chest. He has had numbness on both pinkies, which starts in the morning, randomly occur and last until 6/7pm. His optometry appointment was more than a year ago.He has not submitted his [facility] application yet, which he still has to fill it out and send to [facility]. The plan is to fill out application, send it in and then call the [facility]. Occasionally, he feels weak and jittery, which resolves after eating. Earlier this year, he had an episode where  he felt dizzy and his blood pressure dropped to 89 systolic. To recover, drinking fluids resulted in the resolution of the episode. His appetite has been fluctuating. His diet varies, with most recently eating chicken and salad. He is unclear about what nutrition advice to follow and would like to see a Nutritionist after being approved for [facility]. He sometimes eats sugar free popsickles to limit increasing water intake. He eats about 3 meals a day and sometimes skips lunch but he always eats something when taking medicine. His exercise decreased from walking an hour a day to 15 minutes a day. He does not smoke. He rarely drinks alcohol, but recently had mimosas a few weeks ago. He does not use any illicit drugs.  Diabetes Associated symptoms include chest pain, polydipsia and polyuria.     Review of Systems  HENT: Positive for trouble swallowing.   Respiratory: Positive for shortness of breath.   Cardiovascular: Positive for chest pain.  Endocrine: Positive for polydipsia and polyuria.  Neurological: Positive for numbness.   He feels some slight chest pain occasionally at a 4/10.  He has some pain in his left side and back, which resolved after decreasing his torsemide dose.  Bradley Mckenzie.  has a past medical history of Asthma, Cardiomyopathy ([place]) (2016), Chronic combined systolic (congestive) and diastolic (congestive) heart failure ([place]), COPD (chronic obstructive pulmonary disease) ([place]), Depression, Diabetes mellitus without complication ([place]) (0086), Hyperlipidemia, Hypertensive cardiomyopathy ([place]) (06/30/2015), Hypertensive heart disease, Morbid obesity ([place]), and Obstructive sleep apnea.  Family History, Social History, current Medications and allergies reviewed and updated in Epic.  Objective:    Blood pressure 140/81, pulse 89, height 5\' 9"  (1.753 m), weight 287 lb 4.8 oz (130.3 kg), SpO2 97 %. Physical Exam Cardiovascular:     Rate and Rhythm: Normal rate and regular  rhythm.     Pulses: Normal pulses.          Carotid pulses are 2+ on the right side and 2+ on the left side.      Radial pulses are 2+ on the right side and 2+ on the left side.       Dorsalis pedis pulses are 2+ on the right side and 2+ on the left side.       Posterior tibial pulses are 2+ on the right side and 2+ on the left side.     Heart sounds: Normal heart sounds, S1 normal and S2 normal.  Pulmonary:     Effort: Pulmonary effort is normal. No respiratory distress.     Breath sounds: Normal breath sounds. No stridor. No wheezing, rhonchi or rales.  Musculoskeletal:     Right lower leg: No edema.     Left lower leg: No edema.  Feet:     Comments: Monofilament foot exam was performed.  Neurological:  Mental Status: He is alert.    Data : I have personally reviewed pertinent labs and imaging studies, if indicated,  with the patient in clinic today.   Lab Orders  No laboratory test(s) ordered today    HC Readings from Last 3 Encounters:  No data found for [facility]    Wt Readings from Last 3 Encounters:  11/30/19 287 lb 4.8 oz (130.3 kg)  10/05/19 287 lb 3.2 oz (130.3 kg)  09/30/19 290 lb (131.5 kg)

## 2019-12-01 ENCOUNTER — Other Ambulatory Visit: Payer: Self-pay

## 2019-12-01 ENCOUNTER — Other Ambulatory Visit: Payer: Self-pay | Admitting: Family Medicine

## 2019-12-01 DIAGNOSIS — J449 Chronic obstructive pulmonary disease, unspecified: Secondary | ICD-10-CM

## 2019-12-01 MED ORDER — OZEMPIC (1 MG/DOSE) 2 MG/1.5ML ~~LOC~~ SOPN: 1.0000 mg | PEN_INJECTOR | SUBCUTANEOUS | 1 refills | Status: DC

## 2019-12-07 ENCOUNTER — Other Ambulatory Visit: Payer: Self-pay | Admitting: Gerontology

## 2019-12-07 NOTE — Patient Instructions (Addendum)
1. Take metformin one pill in morning and one in evening  2. Increase Ozempic from .5mg   to 1mg  during the week of November 22nd 3. Increase 22 to 25 units of basaglar and continue Farxiga 4. Obtain more glucose test strips form medical management  5. Schedule eye exam 6. Fill out [facility] application, mail it in and then call [employer] to confirm 7. Follow up in 3 months

## 2019-12-15 ENCOUNTER — Telehealth: Payer: Self-pay | Admitting: Pharmacist

## 2019-12-15 NOTE — Telephone Encounter (Signed)
12/15/2019 3:14:52 PM - Ozempic refill faxed to [place] - Wednesday, December 15, 2019 3:14 PM --[employer] refill for Ozempic 0.5mg  once weekly # 5 boxes.

## 2019-12-31 ENCOUNTER — Other Ambulatory Visit: Payer: Self-pay | Admitting: Gerontology

## 2019-12-31 DIAGNOSIS — E118 Type 2 diabetes mellitus with unspecified complications: Secondary | ICD-10-CM

## 2020-01-02 NOTE — Progress Notes (Signed)
I, Clovis Riley, MD, have reviewed all documentation for this visit. The documentation on 01/02/20 for the exam, diagnosis, procedures, and orders are all accurate and complete. I agree with the assessment and plan as documented in the student's note. Patient was advised of potential benefits/risks/SE of therapy and reasons to contact us before next visit.

## 2020-01-03 ENCOUNTER — Telehealth: Payer: Self-pay | Admitting: Pharmacist

## 2020-01-03 ENCOUNTER — Other Ambulatory Visit: Payer: Self-pay | Admitting: Gerontology

## 2020-01-03 NOTE — Telephone Encounter (Signed)
01/03/2020 8:22:26 AM - [name] forms to pat. & provider  -- Bradley Mckenzie - Monday, January 03, 2020 8:20 AM --Time for patient to renew with [place] for Farxiga--I have put patient portion in pharmacy with other meds(Lantus to pick up) patient needs to sign & date, also sending [facility] to [facility] to sign & return.

## 2020-01-17 ENCOUNTER — Other Ambulatory Visit: Payer: Self-pay | Admitting: Family

## 2020-02-03 ENCOUNTER — Telehealth: Payer: Self-pay | Admitting: Pharmacist

## 2020-02-03 NOTE — Telephone Encounter (Signed)
02/03/2020 4:23:48 PM - ProAir forms to patient & dr  -- Elmer Picker - Thursday, February 03, 2020 4:22 PM -- Ventolin HFA no longer available to order-now ProAir HFA Inhale 2 puffs into the lungs every 6 hours as needed for wheezing or shortness of breath # 4  (replaces Ventolin HFA) Printed [employer] application mailing patient their portion to sign and return with current household income/letter of support, and sending to [facility].

## 2020-02-09 ENCOUNTER — Other Ambulatory Visit: Payer: Self-pay | Admitting: Family Medicine

## 2020-02-09 ENCOUNTER — Telehealth: Payer: Self-pay | Admitting: Pharmacist

## 2020-02-09 DIAGNOSIS — J301 Allergic rhinitis due to pollen: Secondary | ICD-10-CM

## 2020-02-09 NOTE — Telephone Encounter (Signed)
02/09/2020 2:55:01 PM - ProAir HFA Pending  -- Bradley Mckenzie - Wednesday, February 09, 2020 2:54 PM --I have received the signed script & form for ProAir HFA--holding for patient to return his portion, mailed to patient 02/03/2020 to return with current income/support.

## 2020-02-10 ENCOUNTER — Other Ambulatory Visit: Payer: Self-pay | Admitting: Gerontology

## 2020-02-10 ENCOUNTER — Other Ambulatory Visit: Payer: Self-pay | Admitting: Family

## 2020-02-10 DIAGNOSIS — J301 Allergic rhinitis due to pollen: Secondary | ICD-10-CM

## 2020-02-21 ENCOUNTER — Other Ambulatory Visit: Payer: Self-pay | Admitting: Family

## 2020-02-22 ENCOUNTER — Other Ambulatory Visit: Payer: Self-pay | Admitting: Gerontology

## 2020-02-24 ENCOUNTER — Other Ambulatory Visit: Payer: Self-pay

## 2020-02-24 DIAGNOSIS — E1165 Type 2 diabetes mellitus with hyperglycemia: Secondary | ICD-10-CM

## 2020-02-24 DIAGNOSIS — Z794 Long term (current) use of insulin: Secondary | ICD-10-CM

## 2020-02-24 NOTE — Progress Notes (Unsigned)
cmp

## 2020-02-25 ENCOUNTER — Telehealth: Payer: Self-pay | Admitting: Pharmacist

## 2020-02-25 LAB — COMPREHENSIVE METABOLIC PANEL
ALT: 28 IU/L (ref 0–44)
AST: 18 IU/L (ref 0–40)
Albumin/Globulin Ratio: 1.2 (ref 1.2–2.2)
Albumin: 4.7 g/dL (ref 3.8–4.9)
Alkaline Phosphatase: 104 IU/L (ref 44–121)
BUN/Creatinine Ratio: 10 (ref 9–20)
BUN: 15 mg/dL (ref 6–24)
Bilirubin Total: <0.2 mg/dL (ref 0.0–1.2)
CO2: 24 mmol/L (ref 20–29)
Calcium: 9.6 mg/dL (ref 8.7–10.2)
Chloride: 98 mmol/L (ref 96–106)
Creatinine, Ser: 1.57 mg/dL — ABNORMAL HIGH (ref 0.76–1.27)
GFR calc Af Amer: 57 mL/min/{1.73_m2} — ABNORMAL LOW (ref >59)
GFR calc non Af Amer: 50 mL/min/{1.73_m2} — ABNORMAL LOW (ref >59)
Globulin, Total: 3.8 g/dL (ref 1.5–4.5)
Glucose: 150 mg/dL — ABNORMAL HIGH (ref 65–99)
Potassium: 4.1 mmol/L (ref 3.5–5.2)
Sodium: 139 mmol/L (ref 134–144)
Total Protein: 8.5 g/dL (ref 6.0–8.5)

## 2020-02-25 LAB — HEMOGLOBIN A1C
Est. average glucose Bld gHb Est-mCnc: 258 mg/dL
Hgb A1c MFr Bld: 10.6 % — ABNORMAL HIGH (ref 4.8–5.6)

## 2020-02-25 NOTE — Telephone Encounter (Signed)
02/25/2020 11:28:56 AM - ProAir Pending patient POI  -- Elmer Picker - Friday, February 25, 2020 11:25 AM --I have received patient's signed [employer] application for ProAir--patient did NOT return Income/support. I spoke with Derl Barrow, due to patient will want to know why we have not ordered, she stated when patient brought this form in, she told him that we would need income/support (wife's Social Security statement) before we could order this medication. When patient brings we can then order ProAir.

## 2020-02-28 ENCOUNTER — Other Ambulatory Visit: Payer: Self-pay | Admitting: Family

## 2020-02-28 ENCOUNTER — Telehealth: Payer: Self-pay | Admitting: Pharmacist

## 2020-02-28 MED ORDER — DAPAGLIFLOZIN PROPANEDIOL 10 MG PO TABS: 10.0000 mg | ORAL_TABLET | Freq: Every day | ORAL | 3 refills | Status: DC

## 2020-02-28 NOTE — Telephone Encounter (Signed)
02/28/2020 3:34:22 PM - ProAir faxed to [place] - Monday, February 28, 2020 3:33 PM --James Ivanoff application for ProAir [facility]

## 2020-02-29 ENCOUNTER — Other Ambulatory Visit: Payer: Self-pay | Admitting: Gerontology

## 2020-02-29 ENCOUNTER — Other Ambulatory Visit: Payer: Self-pay

## 2020-02-29 ENCOUNTER — Ambulatory Visit: Payer: Self-pay | Admitting: "Endocrinology

## 2020-02-29 VITALS — BP 111/70 | HR 98 | Ht 69.5 in | Wt 285.2 lb

## 2020-02-29 DIAGNOSIS — E119 Type 2 diabetes mellitus without complications: Secondary | ICD-10-CM

## 2020-02-29 NOTE — Progress Notes (Signed)
Follow up Diabetes/ Endocrine [facility]     Patient ID: Bradley Bassano., male   DOB: 26-Jun-1965, 55 y.o.   MRN: [id number] Assessment:  Bradley Janoff. is a 55 y.o. male who is seen in follow up for complex T2DM management at the request of Iloabachie, Chioma E, NP.  Encounter Diagnoses No diagnosis found.  Assessment  Bradley Mckenzie is a 55yo man seen in clinic for 72-monthfollow up on complicated T123456management. Since his last visit, his A1c increase by 0.4 to 10.6, but this included ~4 weeks without Metformin and ~3 weeks without Ozempic. While moving to insulin a second time a day could be an option for better management, he would like to avoid additional injections. As diet is a main challenge for him, and he would be served well to have a dietician consult, specific changes today are focused on dietary recommendations to focus on wheat bread and brown rice, and removing potatoes, cakes, and soda from diet. In three months we will assess if he needs additional insulin daily, as he is currently on high doses of other medications.  Plan:    1. Focus on eating whole wheat bread and brown rice until the next visit. No potatoes, cakes, or sodas. 2. Follow up in three months 3. Schedule with Ophthalmology   There are no Patient Instructions on file for this visit.   No orders of the defined types were placed in this encounter.    Subjective:  Bradley Mckenzie a 89yoman with a PMH of T123456without complication, congestive heart failure, COPD, hyperlipidemia, and hypertensive cardiomyopathy presenting for three-month follow-up on complex T2DM management. Today he reports overall doing "alright," but since his last visit three months ago, his prescriptions for Metformin and Ozempic were out for 3-4 weeks. He is currently managed on '2000mg'$  Metformin daily, 1.'0mg'$  Ozempic weekly, 25 units of Basaglar insulin, and 10 mg of FIrandaily. His A1c was 10.6 this week, up from 10.2 four months ago. He  reports fasting glucose readings "up and down," this week with readings ranging from 109, 114, 119, up to 150s or 170s, but all <200. With significant comorbidities, he is not able to exercise much, but tries with his diet and would still like a dietician consult. He reports strong adherence to his medications, and reports no worsening peripheral neuropathy or blurry vision.    Review of Systems  Eyes: Negative for photophobia and visual disturbance.  Respiratory: Positive for cough.   Gastrointestinal: Negative for diarrhea, nausea and vomiting.  Endocrine: Negative for polyuria.  Neurological: Negative for dizziness.    Bradley Mckenzie  has a past medical history of Asthma, Cardiomyopathy ([place] (2016), Chronic combined systolic (congestive) and diastolic (congestive) heart failure ([place], COPD (chronic obstructive pulmonary disease) ([place], Depression, Diabetes mellitus without complication ([place] ([id number], Hyperlipidemia, Hypertensive cardiomyopathy ([place] (06/30/2015), Hypertensive heart disease, Morbid obesity ([place], and Obstructive sleep apnea.  Family History, Social History, current Medications and allergies reviewed and updated in Epic.  Objective:    There were no vitals taken for this visit. Physical Exam Vitals reviewed.  Constitutional:      General: He is not in acute distress.    Appearance: Normal appearance. He is obese. He is not ill-appearing or toxic-appearing.  HENT:     Head: Normocephalic and atraumatic.     Nose: No congestion or rhinorrhea.  Eyes:     General: No scleral icterus.       Right eye: No discharge.  Left eye: No discharge.  Musculoskeletal:        General: No swelling or tenderness.     Right lower leg: No edema.     Left lower leg: No edema.  Skin:    Coloration: Skin is not jaundiced or pale.     Findings: No lesion or rash.  Neurological:     Mental Status: He is alert.         Data : I have personally reviewed pertinent labs and  imaging studies, if indicated,  with the patient in clinic today.   Lab Orders  No laboratory test(s) ordered today    HC Readings from Last 3 Encounters:  No data found for [facility]    Wt Readings from Last 3 Encounters:  11/30/19 287 lb 4.8 oz (130.3 kg)  10/05/19 287 lb 3.2 oz (130.3 kg)  09/30/19 290 lb (131.5 kg)

## 2020-03-01 ENCOUNTER — Other Ambulatory Visit: Payer: Self-pay | Admitting: Gerontology

## 2020-03-07 ENCOUNTER — Telehealth: Payer: Self-pay | Admitting: Pharmacist

## 2020-03-07 NOTE — Telephone Encounter (Signed)
03/07/2020 9:49:57 AM - Advair refill online with [facility] - Tuesday, March 07, 2020 9:49 AM -- Placed refill online with [facility] for Advair 250/50 [id number], order# [id number], allow 10-14 business days to receive.

## 2020-03-09 ENCOUNTER — Other Ambulatory Visit: Payer: Self-pay | Admitting: Family Medicine

## 2020-03-09 ENCOUNTER — Ambulatory Visit: Payer: Self-pay | Admitting: Family Medicine

## 2020-03-09 ENCOUNTER — Other Ambulatory Visit: Payer: Self-pay

## 2020-03-09 ENCOUNTER — Encounter: Payer: Self-pay | Admitting: Family Medicine

## 2020-03-09 VITALS — BP 106/68 | HR 92 | Temp 97.0°F | Ht 69.0 in | Wt 283.8 lb

## 2020-03-09 DIAGNOSIS — J40 Bronchitis, not specified as acute or chronic: Secondary | ICD-10-CM

## 2020-03-09 DIAGNOSIS — J45901 Unspecified asthma with (acute) exacerbation: Secondary | ICD-10-CM

## 2020-03-09 DIAGNOSIS — K219 Gastro-esophageal reflux disease without esophagitis: Secondary | ICD-10-CM

## 2020-03-09 MED ORDER — PREDNISONE 10 MG (21) PO TBPK: ORAL_TABLET | ORAL | 0 refills | Status: DC

## 2020-03-09 MED ORDER — DOXYCYCLINE HYCLATE 100 MG PO TABS
100.0000 mg | ORAL_TABLET | Freq: Two times a day (BID) | ORAL | 0 refills | Status: AC
Start: 2020-03-09 — End: 2020-03-19

## 2020-03-09 MED ORDER — FLUTICASONE-SALMETEROL 500-50 MCG/DOSE IN AEPB: 1.0000 | INHALATION_SPRAY | Freq: Two times a day (BID) | RESPIRATORY_TRACT | 2 refills | Status: DC

## 2020-03-09 MED ORDER — FAMOTIDINE 20 MG PO TABS: 20.0000 mg | ORAL_TABLET | Freq: Two times a day (BID) | ORAL | 0 refills | Status: DC

## 2020-03-09 NOTE — Progress Notes (Signed)
[facility]   Progress Note: General Provider: Lanae Boast, FNP  SUBJECTIVE:   Bradley Mckenzie. is a 55 y.o. male who  has a past medical history of Asthma, Cardiomyopathy ([place]) (2016), Chronic combined systolic (congestive) and diastolic (congestive) heart failure ([place]), COPD (chronic obstructive pulmonary disease) ([place]), Depression, Diabetes mellitus without complication ([place]) ([id number]), Hyperlipidemia, Hypertensive cardiomyopathy ([place]) (06/30/2015), Hypertensive heart disease, Morbid obesity ([place]), and Obstructive sleep apnea.. Patient presents today for Cough (Persistent cough that sometimes results in vomiting and causes disorientation. ) and Pain (Cramping in his arms, legs, neck, and abdominal area. ) Cough- History of asthma. Currently taking advair which he does not feel is helping. States that he coughs uncontrollably to a point of emesis and disorientation.  Patient states that he is using his albuterol inhaler and nebulizer machine more frequently throughout the day.History of acid reflux. Not currently taking medication.   Goes to [facility] and is followed by Endocrinology through [facility]. A1C 10.6 on 02/24/2020. FBS reported 107-120s most days. Was without medication for several months and recently restarted.  Does have a diagnosis of sleep apnea and uses a CPAP, but states that his machine no longer works and was given a machine by HF clinic. Not sure if settings are correct. Was instructed to fill out [facility] paperwork for further assistance.   Review of Systems  Constitutional: Negative.   Respiratory: Positive for cough, sputum production, shortness of breath and wheezing.   Neurological: Negative.   Psychiatric/Behavioral: Negative.      OBJECTIVE: BP 106/68 (BP Location: Left Arm, Patient Position: Sitting, Cuff Size: Large)   Pulse 92   Temp (!) 97 F (36.1 C)   Ht '5\' 9"'$  (1.753 m)   Wt 283 lb 12.8 oz (128.7 kg)   SpO2 96%   BMI  41.91 kg/m   Wt Readings from Last 3 Encounters:  03/09/20 283 lb 12.8 oz (128.7 kg)  02/29/20 285 lb 3.2 oz (129.4 kg)  11/30/19 287 lb 4.8 oz (130.3 kg)     Physical Exam Constitutional:      Appearance: Normal appearance. He is obese. He is not ill-appearing.  HENT:     Head: Normocephalic and atraumatic.  Cardiovascular:     Rate and Rhythm: Normal rate.     Heart sounds: Normal heart sounds.  Pulmonary:     Effort: Pulmonary effort is normal.     Breath sounds: Wheezing (throughout all lung fields) present.  Neurological:     Mental Status: He is alert and oriented to person, place, and time.  Psychiatric:        Mood and Affect: Mood normal.        Behavior: Behavior normal.        Thought Content: Thought content normal.        Judgment: Judgment normal.     ASSESSMENT/PLAN:   1. Moderate asthma with exacerbation, unspecified whether persistent - doxycycline (VIBRA-TABS) 100 MG tablet; Take 1 tablet (100 mg total) by mouth 2 (two) times daily for 10 days.  Dispense: 20 tablet; Refill: 0 - predniSONE (STERAPRED UNI-PAK 21 TAB) 10 MG (21) TBPK tablet; Take as directed on pack.  Dispense: 21 tablet; Refill: 0 - Fluticasone-Salmeterol (ADVAIR) 500-50 MCG/DOSE AEPB; Inhale 1 puff into the lungs in the morning and at bedtime.  Dispense: 60 each; Refill: 2 - Ambulatory referral to Pulmonology - Discussed monitoring FBS due to steroid use. 2. Bronchitis - doxycycline (VIBRA-TABS) 100 MG tablet; Take  1 tablet (100 mg total) by mouth 2 (two) times daily for 10 days.  Dispense: 20 tablet; Refill: 0 - predniSONE (STERAPRED UNI-PAK 21 TAB) 10 MG (21) TBPK tablet; Take as directed on pack.  Dispense: 21 tablet; Refill: 0  3. Gastroesophageal reflux disease, unspecified whether esophagitis present - famotidine (PEPCID) 20 MG tablet; Take 1 tablet (20 mg total) by mouth 2 (two) times daily.  Dispense: 60 tablet; Refill: 0   We discussed that cramping may be from lack of  oxygenation to the muscles. Focus on asthma exacerbation and wheezing. Will monitor for now. Potassium WNLs. Patient on fluid restrictions d/t CHF.   Return in about 2 weeks (around 03/23/2020) for f/u bronchitis/ asthma. .    The patient was given clear instructions to go to ER or return to medical center if symptoms do not improve, worsen or new problems develop. The patient verbalized understanding and agreed with plan of care.   Ms. Doug Sou. Nathaneil Canary, FNP-BC [place]

## 2020-03-09 NOTE — Patient Instructions (Signed)
Acute Bronchitis, Adult  Acute bronchitis is when air tubes in the lungs (bronchi) suddenly get swollen. The condition can make it hard for you to breathe. In adults, acute bronchitis usually goes away within 2 weeks. A cough caused by bronchitis may last up to 3 weeks. Smoking, allergies, and asthma can make the condition worse. What are the causes? This condition is caused by:  Cold and flu viruses. The most common cause of this condition is the virus that causes the common cold.  Bacteria.  Substances that irritate the lungs, including: ? Smoke from cigarettes and other types of tobacco. ? Dust and pollen. ? Fumes from chemicals, gases, or burned fuel. ? Other materials that pollute indoor or outdoor air.  Close contact with someone who has acute bronchitis. What increases the risk? The following factors may make you more likely to develop this condition:  A weak body's defense system. This is also called the immune system.  Any condition that affects your lungs and breathing, such as asthma. What are the signs or symptoms? Symptoms of this condition include:  A cough.  Coughing up clear, yellow, or green mucus.  Wheezing.  Chest congestion.  Shortness of breath.  A fever.  Body aches.  Chills.  A sore throat. How is this treated? Acute bronchitis may go away over time without treatment. Your doctor may recommend:  Drinking more fluids.  Taking a medicine for a fever or cough.  Using a device that gets medicine into your lungs (inhaler).  Using a vaporizer or a humidifier. These are machines that add water or moisture in the air to help with coughing and poor breathing. Follow these instructions at home: Activity  Get a lot of rest.  Avoid places where there are fumes from chemicals.  Return to your normal activities as told by your doctor. Ask your doctor what activities are safe for you. Lifestyle  Drink enough fluids to keep your pee (urine) pale  yellow.  Do not drink alcohol.  Do not use any products that contain nicotine or tobacco, such as cigarettes, e-cigarettes, and chewing tobacco. If you need help quitting, ask your doctor. Be aware that: ? Your bronchitis will get worse if you smoke or breathe in other people's smoke (secondhand smoke). ? Your lungs will heal faster if you quit smoking. General instructions  Take over-the-counter and prescription medicines only as told by your doctor.  Use an inhaler, cool mist vaporizer, or humidifier as told by your doctor.  Rinse your mouth often with salt water. To make salt water, dissolve -1 tsp (3-6 g) of salt in 1 cup (237 mL) of warm water.  Keep all follow-up visits as told by your doctor. This is important.   How is this prevented? To lower your risk of getting this condition again:  Wash your hands often with soap and water. If soap and water are not available, use hand sanitizer.  Avoid contact with people who have cold symptoms.  Try not to touch your mouth, nose, or eyes with your hands.  Make sure to get the flu shot every year.   Contact a doctor if:  Your symptoms do not get better in 2 weeks.  You vomit more than once or twice.  You have symptoms of loss of fluid from your body (dehydration). These include: ? Dark urine. ? Dry skin or eyes. ? Increased thirst. ? Headaches. ? Confusion. ? Muscle cramps. Get help right away if:  You cough up blood.    You have chest pain.  You have very bad shortness of breath.  You become dehydrated.  You faint or keep feeling like you are going to faint.  You keep vomiting.  You have a very bad headache.  Your fever or chills get worse. These symptoms may be an emergency. Do not wait to see if the symptoms will go away. Get medical help right away. Call your local emergency services (911 in the [place] Do not drive yourself to the hospital. Summary  Acute bronchitis is when air tubes in the lungs (bronchi)  suddenly get swollen. In adults, acute bronchitis usually goes away within 2 weeks.  Take over-the-counter and prescription medicines only as told by your doctor.  Drink enough fluid to keep your pee (urine) pale yellow.  Contact a doctor if your symptoms do not improve after 2 weeks of treatment.  Get help right away if you cough up blood, faint, or have chest pain or shortness of breath. This information is not intended to replace advice given to you by your health care provider. Make sure you discuss any questions you have with your health care provider. Document Revised: 07/24/2018 Document Reviewed: 07/24/2018 Elsevier Patient Education  2021 [place]. Asthma, Adult  Asthma is a long-term (chronic) condition in which the airways get tight and narrow. The airways are the breathing passages that lead from the nose and mouth down into the lungs. A person with asthma will have times when symptoms get worse. These are called asthma attacks. They can cause coughing, whistling sounds when you breathe (wheezing), shortness of breath, and chest pain. They can make it hard to breathe. There is no cure for asthma, but medicines and lifestyle changes can help control it. There are many things that can bring on an asthma attack or make asthma symptoms worse (triggers). Common triggers include:  Mold.  Dust.  Cigarette smoke.  Cockroaches.  Things that can cause allergy symptoms (allergens). These include animal skin flakes (dander) and pollen from trees or grass.  Things that pollute the air. These may include household cleaners, wood smoke, smog, or chemical odors.  Cold air, weather changes, and wind.  Crying or laughing hard.  Stress.  Certain medicines or drugs.  Certain foods such as dried fruit, potato chips, and grape juice.  Infections, such as a cold or the flu.  Certain medical conditions or diseases.  Exercise or tiring activities. Asthma may be treated with  medicines and by staying away from the things that cause asthma attacks. Types of medicines may include:  Controller medicines. These help prevent asthma symptoms. They are usually taken every day.  Fast-acting reliever or rescue medicines. These quickly relieve asthma symptoms. They are used as needed and provide short-term relief.  Allergy medicines if your attacks are brought on by allergens.  Medicines to help control the body's defense (immune) system. Follow these instructions at home: Avoiding triggers in your home  Change your heating and air conditioning filter often.  Limit your use of fireplaces and wood stoves.  Get rid of pests (such as roaches and mice) and their droppings.  Throw away plants if you see mold on them.  Clean your floors. Dust regularly. Use cleaning products that do not smell.  Have someone vacuum when you are not home. Use a vacuum cleaner with a HEPA filter if possible.  Replace carpet with wood, tile, or vinyl flooring. Carpet can trap animal skin flakes and dust.  Use allergy-proof pillows, mattress covers, and box  spring covers.  Wash bed sheets and blankets every week in hot water. Dry them in a dryer.  Keep your bedroom free of any triggers.  Avoid pets and keep windows closed when things that cause allergy symptoms are in the air.  Use blankets that are made of polyester or cotton.  Clean bathrooms and kitchens with bleach. If possible, have someone repaint the walls in these rooms with mold-resistant paint. Keep out of the rooms that are being cleaned and painted.  Wash your hands often with soap and water. If soap and water are not available, use hand sanitizer.  Do not allow anyone to smoke in your home. General instructions  Take over-the-counter and prescription medicines only as told by your doctor. ? Talk with your doctor if you have questions about how or when to take your medicines. ? Make note if you need to use your  medicines more often than usual.  Do not use any products that contain nicotine or tobacco, such as cigarettes and e-cigarettes. If you need help quitting, ask your doctor.  Stay away from secondhand smoke.  Avoid doing things outdoors when allergen counts are high and when air quality is low.  Wear a ski mask when doing outdoor activities in the winter. The mask should cover your nose and mouth. Exercise indoors on cold days if you can.  Warm up before you exercise. Take time to cool down after exercise.  Use a peak flow meter as told by your doctor. A peak flow meter is a tool that measures how well the lungs are working.  Keep track of the peak flow meter's readings. Write them down.  Follow your asthma action plan. This is a written plan for taking care of your asthma and treating your attacks.  Make sure you get all the shots (vaccines) that your doctor recommends. Ask your doctor about a flu shot and a pneumonia shot.  Keep all follow-up visits as told by your doctor. This is important. Contact a doctor if:  You have wheezing, shortness of breath, or a cough even while taking medicine to prevent attacks.  The mucus you cough up (sputum) is thicker than usual.  The mucus you cough up changes from clear or white to yellow, green, gray, or bloody.  You have problems from the medicine you are taking, such as: ? A rash. ? Itching. ? Swelling. ? Trouble breathing.  You need reliever medicines more than 2-3 times a week.  Your peak flow reading is still at 50-79% of your personal best after following the action plan for 1 hour.  You have a fever. Get help right away if:  You seem to be worse and are not responding to medicine during an asthma attack.  You are short of breath even at rest.  You get short of breath when doing very little activity.  You have trouble eating, drinking, or talking.  You have chest pain or tightness.  You have a fast heartbeat.  Your  lips or fingernails start to turn blue.  You are light-headed or dizzy, or you faint.  Your peak flow is less than 50% of your personal best.  You feel too tired to breathe normally. Summary  Asthma is a long-term (chronic) condition in which the airways get tight and narrow. An asthma attack can make it hard to breathe.  Asthma cannot be cured, but medicines and lifestyle changes can help control it.  Make sure you understand how to avoid triggers and  how and when to use your medicines. This information is not intended to replace advice given to you by your health care provider. Make sure you discuss any questions you have with your health care provider. Document Revised: 05/05/2019 Document Reviewed: 05/05/2019 Elsevier Patient Education  2021 [employer].

## 2020-03-21 ENCOUNTER — Other Ambulatory Visit: Payer: Self-pay | Admitting: Gerontology

## 2020-03-21 DIAGNOSIS — J301 Allergic rhinitis due to pollen: Secondary | ICD-10-CM

## 2020-03-23 ENCOUNTER — Other Ambulatory Visit: Payer: Self-pay

## 2020-03-23 ENCOUNTER — Ambulatory Visit: Payer: Self-pay | Admitting: Family Medicine

## 2020-03-23 ENCOUNTER — Telehealth: Payer: Self-pay | Admitting: Pharmacist

## 2020-03-23 VITALS — BP 110/66 | HR 105 | Wt 281.1 lb

## 2020-03-23 DIAGNOSIS — J449 Chronic obstructive pulmonary disease, unspecified: Secondary | ICD-10-CM

## 2020-03-23 DIAGNOSIS — R252 Cramp and spasm: Secondary | ICD-10-CM

## 2020-03-23 NOTE — Patient Instructions (Signed)
Muscle Cramps and Spasms Muscle cramps and spasms are when muscles tighten by themselves. They usually get better within minutes. Muscle cramps are painful. They are usually stronger and last longer than muscle spasms. Muscle spasms may or may not be painful. They can last a few seconds or much longer. Cramps and spasms can affect any muscle, but they occur most often in the calf muscles of the leg. They are usually not caused by a serious problem. In many cases, the cause is not known. Some common causes include:  Doing more physical work or exercise than your body is ready for.  Using the muscles too much (overuse) by repeating certain movements too many times.  Staying in a certain position for a long time.  Playing a sport or doing an activity without preparing properly.  Using bad form or technique while playing a sport or doing an activity.  Not having enough water in your body (dehydration).  Injury.  Side effects of some medicines.  Low levels of the salts and minerals in your blood (electrolytes), such as low potassium or calcium. Follow these instructions at home: Managing pain and stiffness  Massage, stretch, and relax the muscle. Do this for many minutes at a time.  If told, put heat on tight or tense muscles as often as told by your doctor. Use the heat source that your doctor recommends, such as a moist heat pack or a heating pad. ? Place a towel between your skin and the heat source. ? Leave the heat on for 20-30 minutes. ? Remove the heat if your skin turns bright red. This is very important if you are not able to feel pain, heat, or cold. You may have a greater risk of getting burned.  If told, put ice on the affected area. This may help if you are sore or have pain after a cramp or spasm. ? Put ice in a plastic bag. ? Place a towel between your skin and the bag. ? Leave the ice on for 20 minutes, 2-3 times a day.  Try taking hot showers or baths to help relax  tight muscles.      Eating and drinking  Drink enough fluid to keep your pee (urine) pale yellow.  Eat a healthy diet to help ensure that your muscles work well. This should include: ? Fruits and vegetables. ? Lean protein. ? Whole grains. ? Low-fat or nonfat dairy products. General instructions  If you are having cramps often, avoid intense exercise for several days.  Take over-the-counter and prescription medicines only as told by your doctor.  Watch for any changes in your symptoms.  Keep all follow-up visits as told by your doctor. This is important. Contact a doctor if:  Your cramps or spasms get worse or happen more often.  Your cramps or spasms do not get better with time. Summary  Muscle cramps and spasms are when muscles tighten by themselves. They usually get better within minutes.  Cramps and spasms occur most often in the calf muscles of the leg.  Massage, stretch, and relax the muscle. This may help the cramp or spasm go away.  Drink enough fluid to keep your pee (urine) pale yellow. This information is not intended to replace advice given to you by your health care provider. Make sure you discuss any questions you have with your health care provider. Document Revised: 05/26/2017 Document Reviewed: 05/26/2017 Elsevier Patient Education  2021 Elsevier Inc.  

## 2020-03-23 NOTE — Progress Notes (Signed)
  [facility]   Progress Note: General Provider: Lanae Boast, FNP  SUBJECTIVE:   [facility]. is a 55 y.o. male who  has a past medical history of Asthma, Cardiomyopathy ([place]) (2016), Chronic combined systolic (congestive) and diastolic (congestive) heart failure ([place]), COPD (chronic obstructive pulmonary disease) ([place]), Depression, Diabetes mellitus without complication ([place]) (99991111), Hyperlipidemia, Hypertensive cardiomyopathy ([place]) (06/30/2015), Hypertensive heart disease, Morbid obesity ([place]), and Obstructive sleep apnea.. Patient presents today for Follow-up  Patient states that he does note some improve in breathing with taking doxycycline and prednisone. At the last visit, Advair was increased. He states that he is not having to use the nebulizer as often the past 2 weeks. Continues to have muscle cramping in the legs and abdomin intermittently. He is followed by CHF clinic and is on fluid restrictions.    Review of Systems  Constitutional: Negative.   Respiratory: Positive for cough and wheezing.   Musculoskeletal: Positive for myalgias.     OBJECTIVE: BP 110/66   Pulse (!) 105   Wt 281 lb 1.6 oz (127.5 kg)   SpO2 95%   BMI 41.51 kg/m   Wt Readings from Last 3 Encounters:  03/23/20 281 lb 1.6 oz (127.5 kg)  03/09/20 283 lb 12.8 oz (128.7 kg)  02/29/20 285 lb 3.2 oz (129.4 kg)     Physical Exam Constitutional:      General: He is not in acute distress.    Appearance: He is obese.  HENT:     Head: Normocephalic and atraumatic.  Cardiovascular:     Rate and Rhythm: Normal rate and regular rhythm.  Pulmonary:     Effort: Pulmonary effort is normal.     Breath sounds: Wheezing (improved. right Upper and lower resolved. Left Upper and lower mild. ) present. No rhonchi or rales.  Musculoskeletal:        General: Normal range of motion.  Neurological:     Mental Status: He is alert.  Psychiatric:        Mood and Affect: Mood normal.      ASSESSMENT/PLAN:  1. Muscle cramps Discussed daily stretching. Multiple possible causes to include dehydration (fluid restricted), polypharmacy, and electrolyte/vitamin deficiency- Magnesium; Future  2. Chronic obstructive pulmonary disease, unspecified COPD type ([place]) Referred to pulmo for further evaluation. Patient to continue with antibiotics and previously prescribed medications. Encouraged to complete paperwork for charity care.    Return in about 4 weeks (around 04/20/2020).    The patient was given clear instructions to go to ER or return to medical center if symptoms do not improve, worsen or new problems develop. The patient verbalized understanding and agreed with plan of care.   Ms. [facility]. Nathaneil Canary, FNP-BC [place]

## 2020-03-23 NOTE — Telephone Encounter (Signed)
03/23/2020 8:22:25 AM - Ozempic refill to provider  -- Elmer Picker - Thursday, March 23, 2020 8:21 AM -- [employer] refill request for [employer] 0.'5mg'$  once weekly to [facility] for provider to sign.

## 2020-03-24 ENCOUNTER — Telehealth: Payer: Self-pay | Admitting: Pharmacist

## 2020-03-24 NOTE — Telephone Encounter (Signed)
03/24/2020 4:01:28 PM - Advair 500/50 script to [facility] - Friday, March 24, 2020 4:00 PM --Faxed script for Advair 500/50 DOSE INCREASE to [facility] to process.

## 2020-03-24 NOTE — Telephone Encounter (Signed)
03/24/2020 4:01:28 PM - Advair 500/50 script to [place] - Friday, March 24, 2020 4:00 PM --Faxed script for Advair 500/50 DOSE INCREASE to [place] [facility] to process.

## 2020-03-28 ENCOUNTER — Telehealth: Payer: Self-pay | Admitting: Pharmacist

## 2020-03-28 NOTE — Telephone Encounter (Signed)
03/28/2020 3:07:23 PM - Ozempic refill faxed to [facility] - Tuesday, March 28, 2020 3:06 PM --Received notice from [employer] for refill on [employer] 0.'5mg'$  once weekly 5 boxes--Faxed to [facility] today.

## 2020-04-06 ENCOUNTER — Other Ambulatory Visit: Payer: Self-pay

## 2020-04-12 ENCOUNTER — Other Ambulatory Visit: Payer: Self-pay

## 2020-04-16 ENCOUNTER — Other Ambulatory Visit: Payer: Self-pay | Admitting: Family Medicine

## 2020-04-16 ENCOUNTER — Other Ambulatory Visit: Payer: Self-pay | Admitting: Family

## 2020-04-16 DIAGNOSIS — I5032 Chronic diastolic (congestive) heart failure: Secondary | ICD-10-CM

## 2020-04-16 DIAGNOSIS — K219 Gastro-esophageal reflux disease without esophagitis: Secondary | ICD-10-CM

## 2020-04-17 ENCOUNTER — Other Ambulatory Visit: Payer: Self-pay

## 2020-04-17 MED ORDER — TORSEMIDE 20 MG PO TABS
ORAL_TABLET | Freq: Two times a day (BID) | ORAL | 1 refills | Status: DC
  Filled 2020-04-17: qty 120, 30d supply, fill #0
  Filled 2020-06-09: qty 56, 14d supply, fill #1
  Filled 2020-07-25: qty 56, 14d supply, fill #2

## 2020-04-18 ENCOUNTER — Other Ambulatory Visit: Payer: Self-pay

## 2020-04-19 ENCOUNTER — Other Ambulatory Visit: Payer: Self-pay

## 2020-04-20 ENCOUNTER — Ambulatory Visit: Payer: Self-pay | Admitting: Gerontology

## 2020-04-20 ENCOUNTER — Other Ambulatory Visit: Payer: Self-pay

## 2020-04-20 VITALS — BP 100/71 | HR 100 | Temp 97.4°F | Ht 69.0 in | Wt 280.0 lb

## 2020-04-20 DIAGNOSIS — R252 Cramp and spasm: Secondary | ICD-10-CM

## 2020-04-20 DIAGNOSIS — E118 Type 2 diabetes mellitus with unspecified complications: Secondary | ICD-10-CM

## 2020-04-20 DIAGNOSIS — J449 Chronic obstructive pulmonary disease, unspecified: Secondary | ICD-10-CM

## 2020-04-20 DIAGNOSIS — E119 Type 2 diabetes mellitus without complications: Secondary | ICD-10-CM

## 2020-04-20 MED ORDER — ALBUTEROL SULFATE HFA 108 (90 BASE) MCG/ACT IN AERS
2.0000 | INHALATION_SPRAY | Freq: Four times a day (QID) | RESPIRATORY_TRACT | 2 refills | Status: DC | PRN
  Filled 2020-04-20: qty 6.7, 25d supply, fill #0

## 2020-04-20 NOTE — Patient Instructions (Signed)
https://www.nhlbi.nih.gov/files/docs/public/heart/dash_brief.pdf">  DASH Eating Plan DASH stands for Dietary Approaches to Stop Hypertension. The DASH eating plan is a healthy eating plan that has been shown to:  Reduce high blood pressure (hypertension).  Reduce your risk for type 2 diabetes, heart disease, and stroke.  Help with weight loss. What are tips for following this plan? Reading food labels  Check food labels for the amount of salt (sodium) per serving. Choose foods with less than 5 percent of the Daily Value of sodium. Generally, foods with less than 300 milligrams (mg) of sodium per serving fit into this eating plan.  To find whole grains, look for the word "whole" as the first word in the ingredient list. Shopping  Buy products labeled as "low-sodium" or "no salt added."  Buy fresh foods. Avoid canned foods and pre-made or frozen meals. Cooking  Avoid adding salt when cooking. Use salt-free seasonings or herbs instead of table salt or sea salt. Check with your health care provider or pharmacist before using salt substitutes.  Do not fry foods. Cook foods using healthy methods such as baking, boiling, grilling, roasting, and broiling instead.  Cook with heart-healthy oils, such as olive, canola, avocado, soybean, or sunflower oil. Meal planning  Eat a balanced diet that includes: ? 4 or more servings of fruits and 4 or more servings of vegetables each day. Try to fill one-half of your plate with fruits and vegetables. ? 6-8 servings of whole grains each day. ? Less than 6 oz (170 g) of lean meat, poultry, or fish each day. A 3-oz (85-g) serving of meat is about the same size as a deck of cards. One egg equals 1 oz (28 g). ? 2-3 servings of low-fat dairy each day. One serving is 1 cup (237 mL). ? 1 serving of nuts, seeds, or beans 5 times each week. ? 2-3 servings of heart-healthy fats. Healthy fats called omega-3 fatty acids are found in foods such as walnuts,  flaxseeds, fortified milks, and eggs. These fats are also found in cold-water fish, such as sardines, salmon, and mackerel.  Limit how much you eat of: ? Canned or prepackaged foods. ? Food that is high in trans fat, such as some fried foods. ? Food that is high in saturated fat, such as fatty meat. ? Desserts and other sweets, sugary drinks, and other foods with added sugar. ? Full-fat dairy products.  Do not salt foods before eating.  Do not eat more than 4 egg yolks a week.  Try to eat at least 2 vegetarian meals a week.  Eat more home-cooked food and less restaurant, buffet, and fast food.   Lifestyle  When eating at a restaurant, ask that your food be prepared with less salt or no salt, if possible.  If you drink alcohol: ? Limit how much you use to:  0-1 drink a day for women who are not pregnant.  0-2 drinks a day for men. ? Be aware of how much alcohol is in your drink. In the [place] one drink equals one 12 oz bottle of beer (355 mL), one 5 oz glass of wine (148 mL), or one 1 oz glass of hard liquor (44 mL). General information  Avoid eating more than 2,300 mg of salt a day. If you have hypertension, you may need to reduce your sodium intake to 1,500 mg a day.  Work with your health care provider to maintain a healthy body weight or to lose weight. Ask what an ideal weight is for   you.  Get at least 30 minutes of exercise that causes your heart to beat faster (aerobic exercise) most days of the week. Activities may include walking, swimming, or biking.  Work with your health care provider or dietitian to adjust your eating plan to your individual calorie needs. What foods should I eat? Fruits All fresh, dried, or frozen fruit. Canned fruit in natural juice (without added sugar). Vegetables Fresh or frozen vegetables (raw, steamed, roasted, or grilled). Low-sodium or reduced-sodium tomato and vegetable juice. Low-sodium or reduced-sodium tomato sauce and tomato paste.  Low-sodium or reduced-sodium canned vegetables. Grains Whole-grain or whole-wheat bread. Whole-grain or whole-wheat pasta. Brown rice. Oatmeal. Quinoa. Bulgur. Whole-grain and low-sodium cereals. Pita bread. Low-fat, low-sodium crackers. Whole-wheat flour tortillas. Meats and other proteins Skinless chicken or turkey. Ground chicken or turkey. Pork with fat trimmed off. Fish and seafood. Egg whites. Dried beans, peas, or lentils. Unsalted nuts, nut butters, and seeds. Unsalted canned beans. Lean cuts of beef with fat trimmed off. Low-sodium, lean precooked or cured meat, such as sausages or meat loaves. Dairy Low-fat (1%) or fat-free (skim) milk. Reduced-fat, low-fat, or fat-free cheeses. Nonfat, low-sodium ricotta or cottage cheese. Low-fat or nonfat yogurt. Low-fat, low-sodium cheese. Fats and oils Soft margarine without trans fats. Vegetable oil. Reduced-fat, low-fat, or light mayonnaise and salad dressings (reduced-sodium). Canola, safflower, olive, avocado, soybean, and sunflower oils. Avocado. Seasonings and condiments Herbs. Spices. Seasoning mixes without salt. Other foods Unsalted popcorn and pretzels. Fat-free sweets. The items listed above may not be a complete list of foods and beverages you can eat. Contact a dietitian for more information. What foods should I avoid? Fruits Canned fruit in a light or heavy syrup. Fried fruit. Fruit in cream or butter sauce. Vegetables Creamed or fried vegetables. Vegetables in a cheese sauce. Regular canned vegetables (not low-sodium or reduced-sodium). Regular canned tomato sauce and paste (not low-sodium or reduced-sodium). Regular tomato and vegetable juice (not low-sodium or reduced-sodium). Pickles. Olives. Grains Baked goods made with fat, such as croissants, muffins, or some breads. Dry pasta or rice meal packs. Meats and other proteins Fatty cuts of meat. Ribs. Fried meat. Bacon. Bologna, salami, and other precooked or cured meats, such as  sausages or meat loaves. Fat from the back of a pig (fatback). Bratwurst. Salted nuts and seeds. Canned beans with added salt. Canned or smoked fish. Whole eggs or egg yolks. Chicken or turkey with skin. Dairy Whole or 2% milk, cream, and half-and-half. Whole or full-fat cream cheese. Whole-fat or sweetened yogurt. Full-fat cheese. Nondairy creamers. Whipped toppings. Processed cheese and cheese spreads. Fats and oils Butter. Stick margarine. Lard. Shortening. Ghee. Bacon fat. Tropical oils, such as coconut, palm kernel, or palm oil. Seasonings and condiments Onion salt, garlic salt, seasoned salt, table salt, and sea salt. Worcestershire sauce. Tartar sauce. Barbecue sauce. Teriyaki sauce. Soy sauce, including reduced-sodium. Steak sauce. Canned and packaged gravies. Fish sauce. Oyster sauce. Cocktail sauce. Store-bought horseradish. Ketchup. Mustard. Meat flavorings and tenderizers. Bouillon cubes. Hot sauces. Pre-made or packaged marinades. Pre-made or packaged taco seasonings. Relishes. Regular salad dressings. Other foods Salted popcorn and pretzels. The items listed above may not be a complete list of foods and beverages you should avoid. Contact a dietitian for more information. Where to find more information  National Heart, Lung, and Blood Institute: www.nhlbi.nih.gov  American Heart Association: www.heart.org  Academy of Nutrition and Dietetics: www.eatright.org  National Kidney Foundation: www.kidney.org Summary  The DASH eating plan is a healthy eating plan that has been shown to   reduce high blood pressure (hypertension). It may also reduce your risk for type 2 diabetes, heart disease, and stroke.  When on the DASH eating plan, aim to eat more fresh fruits and vegetables, whole grains, lean proteins, low-fat dairy, and heart-healthy fats.  With the DASH eating plan, you should limit salt (sodium) intake to 2,300 mg a day. If you have hypertension, you may need to reduce your  sodium intake to 1,500 mg a day.  Work with your health care provider or dietitian to adjust your eating plan to your individual calorie needs. This information is not intended to replace advice given to you by your health care provider. Make sure you discuss any questions you have with your health care provider. Document Revised: 12/04/2018 Document Reviewed: 12/04/2018 Elsevier Patient Education  2021 [employer]. Diabetes Mellitus Basics  Diabetes mellitus, or diabetes, is a long-term (chronic) disease. It occurs when the body does not properly use sugar (glucose) that is released from food after you eat. Diabetes mellitus may be caused by one or both of these problems:  Your pancreas does not make enough of a hormone called insulin.  Your body does not react in a normal way to the insulin that it makes. Insulin lets glucose enter cells in your body. This gives you energy. If you have diabetes, glucose cannot get into cells. This causes high blood glucose (hyperglycemia). How to treat and manage diabetes You may need to take insulin or other diabetes medicines daily to keep your glucose in balance. If you are prescribed insulin, you will learn how to give yourself insulin by injection. You may need to adjust the amount of insulin you take based on the foods that you eat. You will need to check your blood glucose levels using a glucose monitor as told by your health care provider. The readings can help determine if you have low or high blood glucose. Generally, you should have these blood glucose levels:  Before meals (preprandial): 80-130 mg/dL (4.4-7.2 mmol/L).  After meals (postprandial): below 180 mg/dL (10 mmol/L).  Hemoglobin A1c (HbA1c) level: less than 7%. Your health care provider will set treatment goals for you. Keep all follow-up visits. This is important. Follow these instructions at home: Diabetes medicines Take your diabetes medicines every day as told by your health  care provider. List your diabetes medicines here:  Name of medicine: ______________________________ ? Amount (dose): _______________ Time (a.m./p.m.): _______________ Notes: ___________________________________  Name of medicine: ______________________________ ? Amount (dose): _______________ Time (a.m./p.m.): _______________ Notes: ___________________________________  Name of medicine: ______________________________ ? Amount (dose): _______________ Time (a.m./p.m.): _______________ Notes: ___________________________________ Insulin If you use insulin, list the types of insulin you use here:  Insulin type: ______________________________ ? Amount (dose): _______________ Time (a.m./p.m.): _______________Notes: ___________________________________  Insulin type: ______________________________ ? Amount (dose): _______________ Time (a.m./p.m.): _______________ Notes: ___________________________________  Insulin type: ______________________________ ? Amount (dose): _______________ Time (a.m./p.m.): _______________ Notes: ___________________________________  Insulin type: ______________________________ ? Amount (dose): _______________ Time (a.m./p.m.): _______________ Notes: ___________________________________  Insulin type: ______________________________ ? Amount (dose): _______________ Time (a.m./p.m.): _______________ Notes: ___________________________________ Managing blood glucose Check your blood glucose levels using a glucose monitor as told by your health care provider. Write down the times that you check your glucose levels here:  Time: _______________ Notes: ___________________________________  Time: _______________ Notes: ___________________________________  Time: _______________ Notes: ___________________________________  Time: _______________ Notes: ___________________________________  Time: _______________ Notes: ___________________________________  Time: _______________  Notes: ___________________________________   Low blood glucose Low blood glucose (hypoglycemia) is when glucose is at or below 70  mg/dL (3.9 mmol/L). Symptoms may include:  Feeling: ? Hungry. ? Sweaty and clammy. ? Irritable or easily upset. ? Dizzy. ? Sleepy.  Having: ? A fast heartbeat. ? A headache. ? A change in your vision. ? Numbness around the mouth, lips, or tongue.  Having trouble with: ? Moving (coordination). ? Sleeping. Treating low blood glucose To treat low blood glucose, eat or drink something containing sugar right away. If you can think clearly and swallow safely, follow the 15:15 rule:  Take 15 grams of a fast-acting carb (carbohydrate), as told by your health care provider.  Some fast-acting carbs are: ? Glucose tablets: take 3-4 tablets. ? Hard candy: eat 3-5 pieces. ? Fruit juice: drink 4 oz (120 mL). ? Regular (not diet) soda: drink 4-6 oz (120-180 mL). ? Honey or sugar: eat 1 Tbsp (15 mL).  Check your blood glucose levels 15 minutes after you take the carb.  If your glucose is still at or below 70 mg/dL (3.9 mmol/L), take 15 grams of a carb again.  If your glucose does not go above 70 mg/dL (3.9 mmol/L) after 3 tries, get help right away.  After your glucose goes back to normal, eat a meal or a snack within 1 hour. Treating very low blood glucose If your glucose is at or below 54 mg/dL (3 mmol/L), you have very low blood glucose (severe hypoglycemia). This is an emergency. Do not wait to see if the symptoms will go away. Get medical help right away. Call your local emergency services (911 in the [place] Do not drive yourself to the hospital. Questions to ask your health care provider  Should I talk with a diabetes educator?  What equipment will I need to care for myself at home?  What diabetes medicines do I need? When should I take them?  How often do I need to check my blood glucose levels?  What number can I call if I have  questions?  When is my follow-up visit?  Where can I find a support group for people with diabetes? Where to find more information  American Diabetes Association: www.diabetes.org  Association of Diabetes Care and Education Specialists: www.diabeteseducator.org Contact a health care provider if:  Your blood glucose is at or above 240 mg/dL (13.3 mmol/L) for 2 days in a row.  You have been sick or have had a fever for 2 days or more, and you are not getting better.  You have any of these problems for more than 6 hours: ? You cannot eat or drink. ? You feel nauseous. ? You vomit. ? You have diarrhea. Get help right away if:  Your blood glucose is lower than 54 mg/dL (3 mmol/L).  You get confused.  You have trouble thinking clearly.  You have trouble breathing. These symptoms may represent a serious problem that is an emergency. Do not wait to see if the symptoms will go away. Get medical help right away. Call your local emergency services (911 in the [place] Do not drive yourself to the hospital. Summary  Diabetes mellitus is a chronic disease that occurs when the body does not properly use sugar (glucose) that is released from food after you eat.  Take insulin and diabetes medicines as told.  Check your blood glucose every day, as often as told.  Keep all follow-up visits. This is important. This information is not intended to replace advice given to you by your health care provider. Make sure you discuss any questions you have with your  health care provider. Document Revised: 05/04/2019 Document Reviewed: 05/04/2019 Elsevier Patient Education  2021 [employer].

## 2020-04-20 NOTE — Progress Notes (Signed)
[facility]   Progress Note: General Provider: Wolfgang Phoenix, NP  SUBJECTIVE:   Bradley Mckenzie. is a 55 y.o. male who  has a past medical history of Asthma, Cardiomyopathy ([place]) (2016), Chronic combined systolic (congestive) and diastolic (congestive) heart failure ([place]), COPD (chronic obstructive pulmonary disease) ([place]), Depression, Diabetes mellitus without complication ([place]) (99991111), Hyperlipidemia, Hypertensive cardiomyopathy ([place]) (06/30/2015), Hypertensive heart disease, Morbid obesity ([place]), and Obstructive sleep apnea.Patient presents today for COPD follow-up and lab review.He states that his COPD has been relatively manageable; he hasn't used his nebulizer in the past two weeks. However, walking and a strong smell such as perfume or food exacerbates his COPD. He reports that he was a light smoker, with less than ten cigarettes a week, and quit smoking at 29, exposed to secondhand smoke as a child. Denies chest pain, palpitations or shortness of breath. HgbA1c done on 02/10 22 was 10.6 %. He states that his HgbA1c didn't improve because he was out of Metformin and ozempic from the end of November 2021 to January 2022. He checks blood glucose in the morning; he didn't bring a glucose log but endorses average fasting blood glucose of 113-120 mg/dl, denies having hypoglycemia, hyperglycemia, or peripheral neuropathy symptoms. He states that he doesn't know what kind of food he needs to eat to control his blood glucose "I need to know what I can eat and what I can afford." He reports that his wife is the one who helps him with Lantus and Ozempic administration; therefore, he is not sure of the exact doses he is taking.Per Helene Shoe medical student endocrinology note on 02/29/20 he takes 2000 mg Metformin daily, he reports that he takes 500 mg twice a day. He doesn't check his blood pressure at home because the blood pressure cuff is not working properly, but he plans to  purchase a blood pressure machine and monitor it closely. He is followed by CHF clinic and he reports that torsemide reduced to '20mg'$  a day due to low blood pressure. He was seen by Endocrinology on 02/29/20 at [facility]. He has no other complaints. Overall, he states that he's doing well and offers no further complaint.  Review of Systems  Constitutional: Negative.   HENT: Negative.   Eyes: Negative.   Cardiovascular: Negative.   Gastrointestinal: Positive for heartburn.  Genitourinary: Negative.   Musculoskeletal: Positive for joint pain.  Skin: Negative.   Neurological: Negative.   Endo/Heme/Allergies: Negative.   Psychiatric/Behavioral: Negative.      OBJECTIVE: BP 100/71   Pulse 100   Temp (!) 97.4 F (36.3 C)   Ht '5\' 9"'$  (1.753 m)   Wt 280 lb (127 kg)   SpO2 97%   BMI 41.35 kg/m   Wt Readings from Last 3 Encounters:  04/20/20 280 lb (127 kg)  03/23/20 281 lb 1.6 oz (127.5 kg)  03/09/20 283 lb 12.8 oz (128.7 kg)     Physical Exam Vitals reviewed.  Constitutional:      Appearance: He is obese.  HENT:     Head: Normocephalic.  Cardiovascular:     Rate and Rhythm: Regular rhythm.     Heart sounds: Normal heart sounds.  Pulmonary:     Effort: Pulmonary effort is normal.     Breath sounds: Wheezing (few inspiratory wheezing to bilateral lower lobes) present.  Abdominal:     General: Bowel sounds are normal.     Palpations: Abdomen is soft.  Musculoskeletal:  General: Normal range of motion.     Cervical back: Normal range of motion.  Skin:    General: Skin is warm and dry.  Neurological:     General: No focal deficit present.     Mental Status: He is alert and oriented to person, place, and time.  Psychiatric:        Mood and Affect: Mood normal.        Behavior: Behavior normal.    ASSESSMENT/PLAN:  1. Chronic obstructive pulmonary disease, unspecified COPD type ([place]) No medication changes warranted at the present time, continue current  medications; Advair, Singular and prn albuterol and Duoneb - albuterol (VENTOLIN HFA) 108 (90 Base) MCG/ACT inhaler; Inhale 2 puffs into the lungs every 6 (six) hours as needed for wheezing or shortness of breath.  Dispense: 18 g; Refill: 2  3. Diabetes mellitus without complication ([facility]) -123456 was 10.6%, and your goal should be less than 7%. since you reported not taking Metform and Ozempic from end of Nov 2021 to Jan 2022 prior to the HgbA1c check in February; we will repeat HgbA1c to adjust the treatment plan. Continue current medications. You are advised to bring your wife to the next appointment to clarify your diabetes medication doses at home and to discuss low carb/a low concentrated sweets diet. -Advise to check blood glucose fasting and two hours after meal and to bring blood glucose log to the next appointment. Fasting blood glucose should be between 80 mg/dl to '130mg'$ /dl -He is advised to follow low carb/a low concentrated sweets diet and exercise as tolerated also discussed low carb food choices. He was advise to do daily foot checks. - HgB A1c   Return in 2 weeks (on 05/04/2020), or if symptoms worsen or fail to improve, for diabtes.    The patient was given clear instructions to go to ER or return to medical center if symptoms do not improve, worsen or new problems develop. The patient verbalized understanding and agreed with plan of care.  Eriana Suliman, [place]

## 2020-04-21 ENCOUNTER — Other Ambulatory Visit: Payer: Self-pay | Admitting: Gerontology

## 2020-04-21 ENCOUNTER — Other Ambulatory Visit (HOSPITAL_COMMUNITY): Payer: Self-pay

## 2020-04-21 ENCOUNTER — Other Ambulatory Visit: Payer: Self-pay

## 2020-04-21 DIAGNOSIS — K219 Gastro-esophageal reflux disease without esophagitis: Secondary | ICD-10-CM

## 2020-04-21 LAB — HEMOGLOBIN A1C
Est. average glucose Bld gHb Est-mCnc: 200 mg/dL
Hgb A1c MFr Bld: 8.6 % — ABNORMAL HIGH (ref 4.8–5.6)

## 2020-04-21 LAB — MAGNESIUM: Magnesium: 1.8 mg/dL (ref 1.6–2.3)

## 2020-04-21 LAB — VITAMIN D 25 HYDROXY (VIT D DEFICIENCY, FRACTURES): Vit D, 25-Hydroxy: 14.5 ng/mL — ABNORMAL LOW (ref 30.0–100.0)

## 2020-04-21 LAB — VITAMIN B12: Vitamin B-12: 297 pg/mL (ref 232–1245)

## 2020-04-24 ENCOUNTER — Other Ambulatory Visit: Payer: Self-pay

## 2020-04-27 ENCOUNTER — Other Ambulatory Visit: Payer: Self-pay

## 2020-04-28 ENCOUNTER — Other Ambulatory Visit: Payer: Self-pay | Admitting: Gerontology

## 2020-04-28 ENCOUNTER — Other Ambulatory Visit: Payer: Self-pay

## 2020-04-28 MED FILL — Fluticasone-Salmeterol Aer Powder BA 500-50 MCG/ACT: RESPIRATORY_TRACT | 60 days supply | Qty: 120 | Fill #0 | Status: AC

## 2020-05-01 ENCOUNTER — Other Ambulatory Visit: Payer: Self-pay

## 2020-05-01 MED ORDER — OZEMPIC (0.25 OR 0.5 MG/DOSE) 2 MG/1.5ML ~~LOC~~ SOPN
PEN_INJECTOR | SUBCUTANEOUS | 0 refills | Status: DC
  Filled 2020-05-01: qty 1.5, 28d supply, fill #0
  Filled 2020-06-08: qty 1.5, 28d supply, fill #1
  Filled 2020-08-04: qty 1.5, 28d supply, fill #2
  Filled 2020-08-21: qty 1.5, 28d supply, fill #3
  Filled 2020-09-28: qty 1.5, 28d supply, fill #4

## 2020-05-02 ENCOUNTER — Other Ambulatory Visit: Payer: Self-pay

## 2020-05-04 ENCOUNTER — Other Ambulatory Visit: Payer: Self-pay

## 2020-05-04 ENCOUNTER — Other Ambulatory Visit: Payer: Self-pay | Admitting: Gerontology

## 2020-05-04 DIAGNOSIS — K219 Gastro-esophageal reflux disease without esophagitis: Secondary | ICD-10-CM

## 2020-05-04 MED ORDER — FAMOTIDINE 20 MG PO TABS
ORAL_TABLET | Freq: Two times a day (BID) | ORAL | 1 refills | Status: DC
Start: 2020-05-04 — End: 2020-07-10
  Filled 2020-05-04: qty 60, 30d supply, fill #0
  Filled 2020-06-09: qty 28, 14d supply, fill #1
  Filled 2020-06-21: qty 32, 16d supply, fill #2

## 2020-05-05 ENCOUNTER — Other Ambulatory Visit: Payer: Self-pay

## 2020-05-15 ENCOUNTER — Other Ambulatory Visit: Payer: Self-pay

## 2020-05-16 ENCOUNTER — Other Ambulatory Visit: Payer: Self-pay

## 2020-05-30 ENCOUNTER — Other Ambulatory Visit: Payer: Self-pay

## 2020-06-01 ENCOUNTER — Other Ambulatory Visit: Payer: Self-pay

## 2020-06-04 ENCOUNTER — Other Ambulatory Visit: Payer: Self-pay | Admitting: Family

## 2020-06-04 MED FILL — Hydralazine HCl Tab 25 MG: ORAL | 14 days supply | Qty: 28 | Fill #0 | Status: AC

## 2020-06-04 MED FILL — Potassium Chloride Microencapsulated Crys ER Tab 20 mEq: ORAL | 14 days supply | Qty: 28 | Fill #0 | Status: AC

## 2020-06-05 ENCOUNTER — Other Ambulatory Visit: Payer: Self-pay | Admitting: Family

## 2020-06-05 ENCOUNTER — Other Ambulatory Visit: Payer: Self-pay

## 2020-06-05 MED ORDER — LOSARTAN POTASSIUM 100 MG PO TABS
ORAL_TABLET | Freq: Every day | ORAL | 4 refills | Status: DC
  Filled 2020-06-05: qty 14, 14d supply, fill #0
  Filled 2020-06-21: qty 90, 90d supply, fill #1
  Filled 2020-09-28: qty 90, 90d supply, fill #2

## 2020-06-05 MED ORDER — CARVEDILOL 12.5 MG PO TABS
12.5000 mg | ORAL_TABLET | Freq: Two times a day (BID) | ORAL | 3 refills | Status: DC
  Filled 2020-06-05: qty 28, 14d supply, fill #0
  Filled 2020-09-28: qty 180, 90d supply, fill #1

## 2020-06-06 ENCOUNTER — Emergency Department (HOSPITAL_COMMUNITY): Payer: Self-pay

## 2020-06-06 ENCOUNTER — Emergency Department (HOSPITAL_COMMUNITY)
Admission: EM | Admit: 2020-06-06 | Discharge: 2020-06-07 | Disposition: A | Discharge status: Home / Self Care | Payer: Self-pay | Attending: Emergency Medicine | Admitting: Emergency Medicine

## 2020-06-06 ENCOUNTER — Encounter: Payer: Self-pay | Admitting: Pharmacist

## 2020-06-06 ENCOUNTER — Encounter: Payer: Self-pay | Admitting: Family

## 2020-06-06 ENCOUNTER — Ambulatory Visit: Payer: Self-pay | Attending: Family | Admitting: Family

## 2020-06-06 ENCOUNTER — Other Ambulatory Visit: Payer: Self-pay

## 2020-06-06 ENCOUNTER — Encounter (HOSPITAL_COMMUNITY): Payer: Self-pay | Admitting: *Deleted

## 2020-06-06 VITALS — BP 150/86 | HR 61 | Resp 20 | Ht 69.0 in | Wt 284.0 lb

## 2020-06-06 DIAGNOSIS — M79602 Pain in left arm: Secondary | ICD-10-CM | POA: Insufficient documentation

## 2020-06-06 DIAGNOSIS — Z7982 Long term (current) use of aspirin: Secondary | ICD-10-CM | POA: Insufficient documentation

## 2020-06-06 DIAGNOSIS — Z79899 Other long term (current) drug therapy: Secondary | ICD-10-CM | POA: Insufficient documentation

## 2020-06-06 DIAGNOSIS — M25562 Pain in left knee: Secondary | ICD-10-CM | POA: Insufficient documentation

## 2020-06-06 DIAGNOSIS — J449 Chronic obstructive pulmonary disease, unspecified: Secondary | ICD-10-CM | POA: Insufficient documentation

## 2020-06-06 DIAGNOSIS — Z6841 Body Mass Index (BMI) 40.0 and over, adult: Secondary | ICD-10-CM | POA: Insufficient documentation

## 2020-06-06 DIAGNOSIS — Z9119 Patient's noncompliance with other medical treatment and regimen: Secondary | ICD-10-CM | POA: Insufficient documentation

## 2020-06-06 DIAGNOSIS — E119 Type 2 diabetes mellitus without complications: Secondary | ICD-10-CM | POA: Insufficient documentation

## 2020-06-06 DIAGNOSIS — Z794 Long term (current) use of insulin: Secondary | ICD-10-CM | POA: Insufficient documentation

## 2020-06-06 DIAGNOSIS — J45909 Unspecified asthma, uncomplicated: Secondary | ICD-10-CM | POA: Insufficient documentation

## 2020-06-06 DIAGNOSIS — Z9114 Patient's other noncompliance with medication regimen: Secondary | ICD-10-CM | POA: Insufficient documentation

## 2020-06-06 DIAGNOSIS — I5022 Chronic systolic (congestive) heart failure: Secondary | ICD-10-CM | POA: Insufficient documentation

## 2020-06-06 DIAGNOSIS — I5042 Chronic combined systolic (congestive) and diastolic (congestive) heart failure: Secondary | ICD-10-CM | POA: Insufficient documentation

## 2020-06-06 DIAGNOSIS — G4733 Obstructive sleep apnea (adult) (pediatric): Secondary | ICD-10-CM | POA: Insufficient documentation

## 2020-06-06 DIAGNOSIS — Z7984 Long term (current) use of oral hypoglycemic drugs: Secondary | ICD-10-CM | POA: Insufficient documentation

## 2020-06-06 DIAGNOSIS — F32A Depression, unspecified: Secondary | ICD-10-CM | POA: Insufficient documentation

## 2020-06-06 DIAGNOSIS — Z8249 Family history of ischemic heart disease and other diseases of the circulatory system: Secondary | ICD-10-CM | POA: Insufficient documentation

## 2020-06-06 DIAGNOSIS — I110 Hypertensive heart disease with heart failure: Secondary | ICD-10-CM | POA: Insufficient documentation

## 2020-06-06 DIAGNOSIS — Z7951 Long term (current) use of inhaled steroids: Secondary | ICD-10-CM | POA: Insufficient documentation

## 2020-06-06 DIAGNOSIS — R000 Tachycardia, unspecified: Secondary | ICD-10-CM | POA: Insufficient documentation

## 2020-06-06 DIAGNOSIS — I10 Essential (primary) hypertension: Secondary | ICD-10-CM

## 2020-06-06 DIAGNOSIS — E785 Hyperlipidemia, unspecified: Secondary | ICD-10-CM | POA: Insufficient documentation

## 2020-06-06 DIAGNOSIS — I340 Nonrheumatic mitral (valve) insufficiency: Secondary | ICD-10-CM | POA: Insufficient documentation

## 2020-06-06 DIAGNOSIS — I5032 Chronic diastolic (congestive) heart failure: Secondary | ICD-10-CM

## 2020-06-06 LAB — BASIC METABOLIC PANEL
Anion gap: 10 (ref 5–15)
BUN: 19 mg/dL (ref 6–20)
CO2: 25 mmol/L (ref 22–32)
Calcium: 9.1 mg/dL (ref 8.9–10.3)
Chloride: 100 mmol/L (ref 98–111)
Creatinine, Ser: 1.7 mg/dL — ABNORMAL HIGH (ref 0.61–1.24)
GFR, Estimated: 48 mL/min — ABNORMAL LOW (ref >60)
Glucose, Bld: 229 mg/dL — ABNORMAL HIGH (ref 70–99)
Potassium: 3.8 mmol/L (ref 3.5–5.1)
Sodium: 135 mmol/L (ref 135–145)

## 2020-06-06 LAB — CBC WITH DIFFERENTIAL/PLATELET
Abs Immature Granulocytes: 0.05 10*3/uL (ref 0.00–0.07)
Basophils Absolute: 0.1 10*3/uL (ref 0.0–0.1)
Basophils Relative: 1 %
Eosinophils Absolute: 0.3 10*3/uL (ref 0.0–0.5)
Eosinophils Relative: 3 %
HCT: 45.7 % (ref 39.0–52.0)
Hemoglobin: 13.9 g/dL (ref 13.0–17.0)
Immature Granulocytes: 1 %
Lymphocytes Relative: 20 %
Lymphs Abs: 2.2 10*3/uL (ref 0.7–4.0)
MCH: 26.4 pg (ref 26.0–34.0)
MCHC: 30.4 g/dL (ref 30.0–36.0)
MCV: 86.7 fL (ref 80.0–100.0)
Monocytes Absolute: 0.9 10*3/uL (ref 0.1–1.0)
Monocytes Relative: 9 %
Neutro Abs: 7.4 10*3/uL (ref 1.7–7.7)
Neutrophils Relative %: 66 %
Platelets: 253 10*3/uL (ref 150–400)
RBC: 5.27 MIL/uL (ref 4.22–5.81)
RDW: 16.1 % — ABNORMAL HIGH (ref 11.5–15.5)
WBC: 10.9 10*3/uL — ABNORMAL HIGH (ref 4.0–10.5)
nRBC: 0 % (ref 0.0–0.2)

## 2020-06-06 LAB — TROPONIN I (HIGH SENSITIVITY): Troponin I (High Sensitivity): 2 ng/L (ref <18)

## 2020-06-06 MED ORDER — CARVEDILOL 12.5 MG PO TABS
12.5000 mg | ORAL_TABLET | Freq: Two times a day (BID) | ORAL | 3 refills | Status: DC
  Filled 2020-06-21: qty 180, 90d supply, fill #0

## 2020-06-06 MED ORDER — POTASSIUM CHLORIDE CRYS ER 20 MEQ PO TBCR
EXTENDED_RELEASE_TABLET | Freq: Two times a day (BID) | ORAL | 4 refills | Status: DC
  Filled 2020-06-21: qty 180, 90d supply, fill #0
  Filled 2020-09-28: qty 180, 90d supply, fill #1

## 2020-06-06 NOTE — ED Triage Notes (Signed)
Pt with left knee pain since last week, today began to left leg pain then left arm pain.  HA started today.  Pt states mild SOB.

## 2020-06-06 NOTE — Progress Notes (Signed)
Patient ID: Bradley Jaquith., male    DOB: Dec 17, 1965, 55 y.o.   MRN: [id number]  HPI  [name] is a 55 y/o male who has a history of diabetes, HTN, obstructive sleep apnea, depression, hyperlipidemia, morbid obesity and heart failure with a reduced ejection fraction. Remote tobacco exposure.   Echo report from 10/28/2018 reviewed and showed an EF of 60-65% along with mildly elevated PA pressure. Echo done 09/02/15 with an EF of 30-35%, mild mitral regurgitation and no aortic stenosis. EF has declined from 40-45% in March 2017 but he had been inconsistent taking his medications & not using his CPAP during this time.  Has not been admitted or been in the ED in the last 6 months.   Returns today for a follow-up visit with a chief complaint of minimal shortness of breath upon moderate exertion. He describes this as chronic in nature having been present for several years. He has associated fatigue, cough, headaches, abdominal distention and left knee pain along with this. He denies any difficulty sleeping, palpitations, pedal edema, chest pain, wheezing, dizziness or weight gain.   He is concerned about his chronic cough that he has. He says that when he's been treated with prednisone, his cough improves but then will gradually return. He says that he's waiting on a pulmonology referral to be made.   He's also concerned about his left knee pain. Denies having any injury or any impact to the knee. Feels like there's some swelling and he says that it tends to hurt when he's walking on it.   Past Surgical History:  Procedure Laterality Date  . HERNIA REPAIR  2004   Family History  Problem Relation Age of Onset  . Heart disease Mother        alive & well.  [name] Heart attack Father 71       died @ 80 of cancer.  . Diabetes Father   . Cancer Father        "in his stomach"  . Cancer Sister   . Cancer Paternal Uncle        strong FH malignancy multiple relavies    Social History   Tobacco Use   . Smoking status: Never Smoker  . Smokeless tobacco: Never Used  . Tobacco comment: Quit in his 65's.  Substance Use Topics  . Alcohol use: Not Currently    Alcohol/week: 0.0 standard drinks    Comment: occassional alcohol use at special events   Past Medical History:  Diagnosis Date  . Asthma   . Cardiomyopathy ([place]) 2016   a. 01/2014 Echo: EF 25-30%;  b. ? ischemic vs non-ischemic.  He's never had an ischemic eval.  . Chronic combined systolic (congestive) and diastolic (congestive) heart failure ([place])    a. 01/2014 Echo: EF 25-30%. b. Echo 03/2015: Improved EF of 40-45%  . COPD (chronic obstructive pulmonary disease) ([place])   . Depression   . Diabetes mellitus without complication ([place]) 7711   started med therapy approx 2018  . Hyperlipidemia   . Hypertensive cardiomyopathy ([place]) 06/30/2015  . Hypertensive heart disease    a. Since his 9's.  . Morbid obesity ([place])   . Obstructive sleep apnea    a. Has not used CPAP since ~ 2012.   Past Surgical History:  Procedure Laterality Date  . HERNIA REPAIR  2004   Family History  Problem Relation Age of Onset  . Heart disease Mother        alive & well.  [name]  Heart attack Father 24       died @ 58 of cancer.  . Diabetes Father   . Cancer Father        "in his stomach"  . Cancer Sister   . Cancer Paternal Uncle        strong FH malignancy multiple relavies    Social History   Tobacco Use  . Smoking status: Never Smoker  . Smokeless tobacco: Never Used  . Tobacco comment: Quit in his 43's.  Substance Use Topics  . Alcohol use: Not Currently    Alcohol/week: 0.0 standard drinks    Comment: occassional alcohol use at special events   No Known Allergies  Prior to Admission medications   Medication Sig Start Date End Date Taking? Authorizing Provider  albuterol (VENTOLIN HFA) 108 (90 Base) MCG/ACT inhaler Inhale 2 puffs into the lungs every 6 (six) hours as needed for wheezing or shortness of breath. 04/20/20  Yes Abate, Desta  A, NP  aspirin 81 MG EC tablet TAKE ONE TABLET BY MOUTH EVERY DAY 08/06/19 08/05/20 Yes Darylene Price A, FNP  atorvastatin (LIPITOR) 10 MG tablet TAKE ONE TABLET BY MOUTH EVERY DAY 05/07/19 05/06/20 Yes Alisa Graff, FNP  Blood Glucose Monitoring Suppl (RIGHTEST [place] BLOOD GLUCOSE) w/Device KIT AS DIRECTED 12/01/19 11/30/20 Yes   cetirizine (ZYRTEC) 10 MG tablet TAKE ONE TABLET BY MOUTH EVERY DAY 11/17/19 11/16/20 Yes Iloabachie, Chioma E, NP  dapagliflozin propanediol (FARXIGA) 10 MG TABS tablet TAKE ONE TABLET BY MOUTH EVERY DAY 01/17/20 01/16/21 Yes Jahanna Raether A, FNP  famotidine (PEPCID) 20 MG tablet TAKE ONE TABLET BY MOUTH 2 TIMES A DAY 05/04/20 05/04/21 Yes Iloabachie, Chioma E, NP  Fluticasone-Salmeterol (ADVAIR) 500-50 MCG/DOSE AEPB INHALE ONE PUFF INTO THE LUNGS EVERY MORNING AND AT BEDTIME Patient taking differently: Inhale 1 puff into the lungs 2 (two) times daily. 03/09/20 03/09/21 Yes Lanae Boast, FNP  glucose blood test strip AS DIRECTED 12/01/19 11/30/20 Yes   hydrALAZINE (APRESOLINE) 25 MG tablet TAKE ONE TABLET BY MOUTH 2 TIMES A DAY 03/01/20 11/13/20 Yes Iloabachie, Chioma E, NP  Insulin Glargine (BASAGLAR KWIKPEN) 100 UNIT/ML Inject 25 Units into the skin daily. 09/23/19 03/21/20 Yes Iloabachie, Chioma E, NP  Insulin Pen Needle 32G X 4 MM MISC AS DIRECTED WITH INSULIN 06/29/19 06/28/20 Yes Styner, Netty Starring, MD  ipratropium-albuterol (DUONEB) 0.5-2.5 (3) MG/3ML SOLN INHALE CONTENTS OF ONE VIAL USING NEBULIZER EVERY 4 HOURS AS NEEDED 05/03/19 05/02/20 Yes McGowan, Larene Beach A, PA-C  losartan (COZAAR) 100 MG tablet TAKE ONE TABLET BY MOUTH EVERY DAY 06/05/20 06/05/21 Yes Jader Desai, Aura Fey, FNP  metFORMIN (GLUCOPHAGE) 500 MG tablet TAKE ONE TABLET BY MOUTH 2 TIMES A DAY WITH A MEAL. 01/03/20 01/02/21 Yes Iloabachie, Chioma E, NP  montelukast (SINGULAIR) 10 MG tablet TAKE ONE TABLET BY MOUTH AT BEDTIME 03/21/20 03/21/21 Yes Iloabachie, Chioma E, NP  Rightest GL300 Lancets MISC AS DIRECTED 12/01/19 11/30/20 Yes    Semaglutide,0.25 or 0.5MG /DOS, (OZEMPIC, 0.25 OR 0.5 MG/DOSE,) 2 MG/1.5ML SOPN INJECT 0.5MG  INTO THE SKIN ONCE WEEKLY 05/01/20 05/01/21 Yes Iloabachie, Chioma E, NP  torsemide (DEMADEX) 20 MG tablet TAKE 2 TABLETS BY MOUTH TWO TIMES DAILY Patient taking differently: Take 40 mg by mouth daily. May take additional 20mg  in the evening if needed 04/17/20 04/17/21 Yes Alisa Graff, FNP  carvedilol (COREG) 12.5 MG tablet Take 1 tablet (12.5 mg total) by mouth 2 (two) times daily with a meal. 06/06/20   Alisa Graff, FNP  dapagliflozin propanediol (FARXIGA) 10  MG TABS tablet TAKE ONE TABLET BY MOUTH EVERY DAY BEFORE BREAKFAST Patient not taking: Reported on 06/06/2020 02/28/20 02/27/21  Alisa Graff, FNP  HYDROcodone-acetaminophen (NORCO/VICODIN) 5-325 MG tablet Take 1-2 tablets by mouth every 4 (four) hours as needed. 06/07/20   Orpah Greek, MD  insulin glargine (LANTUS) 100 UNIT/ML Solostar Pen INJECT 25 UNITS INTO THE SKIN EVERY DAY Patient not taking: Reported on 06/06/2020 09/23/19 09/22/20  Iloabachie, Chioma E, NP  metFORMIN (GLUCOPHAGE) 500 MG tablet TAKE ONE TABLET BY MOUTH 2 TIMES A DAY WITH A MEAL. Patient not taking: Reported on 06/06/2020 01/04/20   Iloabachie, Chioma E, NP  methocarbamol (ROBAXIN) 500 MG tablet Take 1 tablet (500 mg total) by mouth every 8 (eight) hours as needed for muscle spasms. 06/07/20   Orpah Greek, MD  potassium chloride SA (KLOR-CON) 20 MEQ tablet TAKE ONE TABLET BY MOUTH 2 TIMES A DAY 06/06/20 06/06/21  Alisa Graff, FNP   Review of Systems  Constitutional: Positive for fatigue. Negative for appetite change.  HENT: Positive for congestion and postnasal drip. Negative for sore throat.   Eyes: Negative.   Respiratory: Positive for cough and shortness of breath (with moderate exertion). Negative for chest tightness and wheezing.   Cardiovascular: Negative for chest pain, palpitations and leg swelling.  Gastrointestinal: Positive for abdominal  distention. Negative for abdominal pain.  Endocrine: Negative.   Genitourinary: Negative.   Musculoskeletal: Positive for arthralgias (left knee for ~ 1 week). Negative for back pain and neck pain.  Skin: Negative.   Allergic/Immunologic: Negative.   Neurological: Positive for headaches (when coughing hard). Negative for dizziness and light-headedness.  Hematological: Negative for adenopathy. Does not bruise/bleed easily.  Psychiatric/Behavioral: Negative for dysphoric mood, sleep disturbance and suicidal ideas. The patient is not nervous/anxious.    Vitals:   06/06/20 1112  BP: (!) 150/86  Pulse: 61  Resp: 20  SpO2: 95%  Weight: 284 lb (128.8 kg)  Height: $Remove'5\' 9"'qRtTPwH$  (1.753 m)   Wt Readings from Last 3 Encounters:  06/06/20 284 lb (128.8 kg)  06/06/20 284 lb (128.8 kg)  04/20/20 280 lb (127 kg)   Lab Results  Component Value Date   CREATININE 1.70 (H) 06/06/2020   CREATININE 1.57 (H) 02/24/2020   CREATININE 1.74 (H) 09/30/2019    Physical Exam Vitals and nursing note reviewed.  Constitutional:      Appearance: He is well-developed.  HENT:     Head: Normocephalic and atraumatic.  Neck:     Vascular: No JVD.  Cardiovascular:     Rate and Rhythm: Normal rate and regular rhythm.  Pulmonary:     Effort: Pulmonary effort is normal.     Breath sounds: Wheezing (expiratory throughout all lung fields) present. No rales.  Abdominal:     General: There is no distension.     Palpations: Abdomen is soft.     Tenderness: There is no abdominal tenderness.  Musculoskeletal:        General: No tenderness.     Cervical back: Neck supple.     Left knee: Normal.     Right lower leg: No edema.     Left lower leg: No edema.  Skin:    General: Skin is warm and dry.  Neurological:     Mental Status: He is alert and oriented to person, place, and time.  Psychiatric:        Behavior: Behavior normal.    Assessment & Plan:  1: Chronic heart failure with now preserved  ejection  fraction-  - NYHA Class II - euvolemic today - weighing daily and he was reminded to call for an overnight weight gain of >2 pounds or a weekly weight gain of >5 pounds - weight down 6 pounds since last here 9 months ago - trying to eat low sodium foods when he can but says it's difficult at times due to finances - patient says that he's waiting on pulmonology referral  - PharmD reconciled medications with the patient  2: HTN- - BP mildly elevated today but he says that his knee has been hurting him for ~ 1 week - went to [facility] on 04/20/20 - BMP on 02/24/20 reviewed and shows sodium 139, potassium 4.1.8, creatinine 1.57 and GFR 57  3: Diabetes- - fasting glucose at home this morning was 154 - A1c on 04/20/20 was 8.6%   Patient did not bring his medications nor a list. Each medication was verbally reviewed with the patient and he was encouraged to bring the bottles to every visit to confirm accuracy of list.  Advised patient to f/u with PCP regarding his knee pain. Encouraged to try wearing a knee brace in the interim.   Return in 4 months or sooner for any questions/problems before then.

## 2020-06-06 NOTE — ED Provider Notes (Signed)
[facility] EMERGENCY DEPARTMENT Provider Note   CSN: [id number] Arrival date & time: 06/06/20  2156     History Chief Complaint  Patient presents with  . Pain    Bradley Mckenzie. is a 55 y.o. male.  Patient presents with complaints of diffuse pain.  Patient reports that he started having pain just below the left knee last week.  He reports that pain was present continuously but worsens when he walks.  He saw his cardiologist earlier today and was told to rest the area and put a knee sleeve on.  He has done that.  Patient reports that over the course of the day the pain then started going down to the lower part of the leg.  Tonight he started having pain in the left arm with some numbness and tingling of his second and third fingers.  No chest pain.  No shortness of breath.  Patient also has been having a sharp pain in the left temple area that is intermittent.  No vision change.        Past Medical History:  Diagnosis Date  . Asthma   . Cardiomyopathy ([place]) 2016   a. 01/2014 Echo: EF 25-30%;  b. ? ischemic vs non-ischemic.  He's never had an ischemic eval.  . Chronic combined systolic (congestive) and diastolic (congestive) heart failure ([place])    a. 01/2014 Echo: EF 25-30%. b. Echo 03/2015: Improved EF of 40-45%  . COPD (chronic obstructive pulmonary disease) ([place])   . Depression   . Diabetes mellitus without complication ([place]) 3532   started med therapy approx 2018  . Hyperlipidemia   . Hypertensive cardiomyopathy ([place]) 06/30/2015  . Hypertensive heart disease    a. Since his 42's.  . Morbid obesity ([place])   . Obstructive sleep apnea    a. Has not used CPAP since ~ 2012.    Patient Active Problem List   Diagnosis Date Noted  . Type 2 diabetes mellitus with complication ([place]) 99/24/2683  . Stomach upset 09/19/2016  . Somatic dysfunction of spine, lumbar 08/28/2016  . Segmental dysfunction of cervical region 08/28/2016  . Muscle spasm of back 08/28/2016  . Gynecomastia  12/22/2015  . Dental caries 12/12/2015  . Medication monitoring encounter 12/12/2015  . Low back pain 12/12/2015  . Chronic kidney disease, stage II (mild)   . OSA on CPAP   . Uncontrolled hypertension 07/08/2015  . Hypertensive cardiomyopathy ([place]) 06/30/2015  . Mixed hyperlipidemia   . Morbid obesity ([place])   . Sinusitis 11/11/2014  . Tachycardia 09/12/2014  . COPD (chronic obstructive pulmonary disease) ([place]) 08/05/2014  . Chronic systolic heart failure ([place]) 06/16/2014  . Obstructive sleep apnea 06/16/2014  . Essential hypertension 06/16/2014  . H/O respiratory system disease 02/24/2014    Past Surgical History:  Procedure Laterality Date  . HERNIA REPAIR  2004       Family History  Problem Relation Age of Onset  . Heart disease Mother        alive & well.  [name] Heart attack Father 109       died @ 14 of cancer.  . Diabetes Father   . Cancer Father        "in his stomach"  . Cancer Sister   . Cancer Paternal Uncle        strong FH malignancy multiple relavies     Social History   Tobacco Use  . Smoking status: Never Smoker  . Smokeless tobacco: Never Used  . Tobacco comment: Quit  in his 96's.  Substance Use Topics  . Alcohol use: Not Currently    Alcohol/week: 0.0 standard drinks    Comment: occassional alcohol use at special events  . Drug use: No    Comment: Used marijuana in his teens.    Home Medications Prior to Admission medications   Medication Sig Start Date End Date Taking? Authorizing Provider  albuterol (VENTOLIN HFA) 108 (90 Base) MCG/ACT inhaler Inhale 2 puffs into the lungs every 6 (six) hours as needed for wheezing or shortness of breath. 04/20/20   Abate, Desta A, NP  aspirin 81 MG EC tablet TAKE ONE TABLET BY MOUTH EVERY DAY 08/06/19 08/05/20  Darylene Price A, FNP  atorvastatin (LIPITOR) 10 MG tablet TAKE ONE TABLET BY MOUTH EVERY DAY 05/07/19 05/06/20  Alisa Graff, FNP  Blood Glucose Monitoring Suppl [facility] BLOOD GLUCOSE) w/Device  KIT AS DIRECTED 12/01/19 11/30/20    carvedilol (COREG) 12.5 MG tablet Take 1 tablet (12.5 mg total) by mouth 2 (two) times daily with a meal. 06/06/20   Alisa Graff, FNP  cetirizine (ZYRTEC) 10 MG tablet TAKE ONE TABLET BY MOUTH EVERY DAY 11/17/19 11/16/20  Iloabachie, Chioma E, NP  dapagliflozin propanediol (FARXIGA) 10 MG TABS tablet TAKE ONE TABLET BY MOUTH EVERY DAY BEFORE BREAKFAST Patient not taking: Reported on 06/06/2020 02/28/20 02/27/21  Alisa Graff, FNP  dapagliflozin propanediol (FARXIGA) 10 MG TABS tablet TAKE ONE TABLET BY MOUTH EVERY DAY 01/17/20 01/16/21  Darylene Price A, FNP  famotidine (PEPCID) 20 MG tablet TAKE ONE TABLET BY MOUTH 2 TIMES A DAY 05/04/20 05/04/21  Iloabachie, Chioma E, NP  Fluticasone-Salmeterol (ADVAIR) 500-50 MCG/DOSE AEPB INHALE ONE PUFF INTO THE LUNGS EVERY MORNING AND AT BEDTIME Patient taking differently: Inhale 1 puff into the lungs 2 (two) times daily. 03/09/20 03/09/21  Lanae Boast, FNP  glucose blood test strip AS DIRECTED 12/01/19 11/30/20    hydrALAZINE (APRESOLINE) 25 MG tablet TAKE ONE TABLET BY MOUTH 2 TIMES A DAY 03/01/20 11/13/20  Iloabachie, Chioma E, NP  Insulin Glargine (BASAGLAR KWIKPEN) 100 UNIT/ML Inject 25 Units into the skin daily. 09/23/19 03/21/20  Iloabachie, Chioma E, NP  insulin glargine (LANTUS) 100 UNIT/ML Solostar Pen INJECT 25 UNITS INTO THE SKIN EVERY DAY Patient not taking: Reported on 06/06/2020 09/23/19 09/22/20  Iloabachie, Chioma E, NP  Insulin Pen Needle 32G X 4 MM MISC AS DIRECTED WITH INSULIN 06/29/19 06/28/20  Austin Miles, MD  ipratropium-albuterol (DUONEB) 0.5-2.5 (3) MG/3ML SOLN INHALE CONTENTS OF ONE VIAL USING NEBULIZER EVERY 4 HOURS AS NEEDED 05/03/19 05/02/20  McGowan, Larene Beach A, PA-C  losartan (COZAAR) 100 MG tablet TAKE ONE TABLET BY MOUTH EVERY DAY 06/05/20 06/05/21  Alisa Graff, FNP  metFORMIN (GLUCOPHAGE) 500 MG tablet TAKE ONE TABLET BY MOUTH 2 TIMES A DAY WITH A MEAL. Patient not taking: Reported on 06/06/2020 01/04/20    Iloabachie, Chioma E, NP  metFORMIN (GLUCOPHAGE) 500 MG tablet TAKE ONE TABLET BY MOUTH 2 TIMES A DAY WITH A MEAL. 01/03/20 01/02/21  Iloabachie, Chioma E, NP  montelukast (SINGULAIR) 10 MG tablet TAKE ONE TABLET BY MOUTH AT BEDTIME 03/21/20 03/21/21  Iloabachie, Chioma E, NP  potassium chloride SA (KLOR-CON) 20 MEQ tablet TAKE ONE TABLET BY MOUTH 2 TIMES A DAY 06/06/20 06/06/21  Alisa Graff, FNP  Rightest GL300 Lancets MISC AS DIRECTED 12/01/19 11/30/20    Semaglutide,0.25 or 0.5MG /DOS, (OZEMPIC, 0.25 OR 0.5 MG/DOSE,) 2 MG/1.5ML SOPN INJECT 0.5MG  INTO THE SKIN ONCE WEEKLY 05/01/20 05/01/21  Iloabachie, Chioma E,  NP  torsemide (DEMADEX) 20 MG tablet TAKE 2 TABLETS BY MOUTH TWO TIMES DAILY Patient taking differently: Take 40 mg by mouth daily. May take additional $RemoveBeforeD'20mg'WuDnmIpMoUUeXF$  in the evening if needed 04/17/20 04/17/21  Alisa Graff, FNP    Allergies    Patient has no known allergies.  Review of Systems   Review of Systems  Musculoskeletal: Positive for arthralgias.  Neurological: Positive for numbness.  All other systems reviewed and are negative.   Physical Exam Updated Vital Signs BP 120/70   Pulse 88   Temp 98.5 F (36.9 C) (Oral)   Resp (!) 23   Ht $R'5\' 9"'Cv$  (1.753 m)   Wt 128.8 kg   SpO2 97%   BMI 41.94 kg/m   Physical Exam Vitals and nursing note reviewed.  Constitutional:      General: He is not in acute distress.    Appearance: Normal appearance. He is well-developed.  HENT:     Head: Normocephalic and atraumatic.     Right Ear: Hearing normal.     Left Ear: Hearing normal.     Nose: Nose normal.  Eyes:     Conjunctiva/sclera: Conjunctivae normal.     Pupils: Pupils are equal, round, and reactive to light.  Cardiovascular:     Rate and Rhythm: Regular rhythm.     Pulses:          Dorsalis pedis pulses are 2+ on the left side.     Heart sounds: S1 normal and S2 normal. No murmur heard. No friction rub. No gallop.   Pulmonary:     Effort: Pulmonary effort is normal. No  respiratory distress.     Breath sounds: Normal breath sounds.  Chest:     Chest wall: No tenderness.  Abdominal:     General: Bowel sounds are normal.     Palpations: Abdomen is soft.     Tenderness: There is no abdominal tenderness. There is no guarding or rebound. Negative signs include Murphy's sign and McBurney's sign.     Hernia: No hernia is present.  Musculoskeletal:        General: Normal range of motion.     Cervical back: Normal range of motion and neck supple.     Left knee: No swelling, deformity, effusion or erythema. Normal range of motion. Tenderness present.     Left lower leg: No swelling or tenderness.  Skin:    General: Skin is warm and dry.     Findings: No rash.  Neurological:     Mental Status: He is alert and oriented to person, place, and time.     GCS: GCS eye subscore is 4. GCS verbal subscore is 5. GCS motor subscore is 6.     Cranial Nerves: No cranial nerve deficit.     Sensory: No sensory deficit.     Coordination: Coordination normal.  Psychiatric:        Speech: Speech normal.        Behavior: Behavior normal.        Thought Content: Thought content normal.     ED Results / Procedures / Treatments   Labs (all labs ordered are listed, but only abnormal results are displayed) Labs Reviewed  CBC WITH DIFFERENTIAL/PLATELET - Abnormal; Notable for the following components:      Result Value   WBC 10.9 (*)    RDW 16.1 (*)    All other components within normal limits  BASIC METABOLIC PANEL - Abnormal; Notable for the following components:  Glucose, Bld 229 (*)    Creatinine, Ser 1.70 (*)    GFR, Estimated 48 (*)    All other components within normal limits  D-DIMER, QUANTITATIVE (NOT AT [facility])  TROPONIN I (HIGH SENSITIVITY)  TROPONIN I (HIGH SENSITIVITY)    EKG EKG Interpretation  Date/Time:  Tuesday Jun 06 2020 22:31:35 EDT Ventricular Rate:  104 PR Interval:  156 QRS Duration: 90 QT Interval:  340 QTC Calculation: 447 R  Axis:   -75 Text Interpretation: Sinus tachycardia Left anterior fascicular block Abnormal ECG No significant change since last tracing Confirmed by Orpah Greek [phone]) on 06/06/2020 11:02:25 PM   Radiology DG Chest Portable 1 View  Result Date: 06/06/2020 CLINICAL DATA:  Dyspnea EXAM: PORTABLE CHEST 1 VIEW COMPARISON:  04/30/2017 FINDINGS: The heart size and mediastinal contours are within normal limits. Both lungs are clear. The visualized skeletal structures are unremarkable. IMPRESSION: No active disease. Electronically Signed   By: Fidela Salisbury MD   On: 06/06/2020 22:52    Procedures Procedures   Medications Ordered in ED Medications  ketorolac (TORADOL) 30 MG/ML injection 30 mg (has no administration in time range)  sodium chloride 0.9 % bolus 500 mL (0 mLs Intravenous Stopped 06/07/20 0334)    ED Course  I have reviewed the triage vital signs and the nursing notes.  Pertinent labs & imaging results that were available during my care of the patient were reviewed by me and considered in my medical decision making (see chart for details).    MDM Rules/Calculators/A&P                          Patient presents to the ER with multiple pain complaints.  Patient has been having pain around the left knee for several days.  Pain is just to the inferior portion of the knee and worsens when he walks.  Knee joint itself appears normal, no effusion, redness, warmth or concern for septic arthritis.  He has normal range of motion.  Pain started radiating down the leg today but he does not have specific calf tenderness or swelling.  Negative Homans, no venous cords.  Doubt DVT.  Tonight he started having headache and left arm pain.  Headache is an intermittent sharp pain in the temple area, not continuous.  No vision changes, no red flags.  He has a normal neurologic exam.    Patient's left arm pain is accompanied by some tingling in the second and third fingers.  He also reports that  he has been having a lot of spasms of the upper arm over the last week.  Numbness with pain does not suggest stroke.  He does not have associated neck pain and no reproducibility with movement of the neck.  Symptoms do seem more consistent with neuropathic pain, possible impingement at the shoulder.  He is not experiencing any chest discomfort.  EKG and troponins are negative.  Patient does not have any unilateral leg swelling but there is leg pain.  He is not experiencing any chest pain but he did have some mild tachypnea, likely secondary to his pain.  DVT and PE were therefore considered in the differential diagnosis, although felt unlikely.  D-dimer is negative and it is felt that this effectively rules out clotting in this low likelihood setting.  Final Clinical Impression(s) / ED Diagnoses Final diagnoses:  Acute pain of left knee  Left arm pain    Rx / DC Orders ED Discharge Orders  None       Orpah Greek, MD 06/07/20 [id number]

## 2020-06-06 NOTE — Patient Instructions (Signed)
Continue weighing daily and call for an overnight weight gain of > 2 pounds or a weekly weight gain of >5 pounds. 

## 2020-06-06 NOTE — Progress Notes (Signed)
[place] - PHARMACIST COUNSELING NOTE  Guideline-Directed Medical Therapy/Evidence Based Medicine  ACE/ARB/ARNI:  Cozaar (Losartan) 100 mg daily Beta Blocker: carvedilol 12.5 mg BID Aldosterone Antagonist: None Diuretic: toresemide 40 mg every day. May take additional 20 mg in the evening PRN edema SGLT2i: Farxiga 10 mg daily  Adherence Assessment  Do you ever forget to take your medication? [] Yes [x] No  Do you ever skip doses due to side effects? [] Yes [x] No  Do you have trouble affording your medicines? [] Yes [x] No  Are you ever unable to pick up your medication due to transportation difficulties? [] Yes [x] No  Do you ever stop taking your medications because you don't believe they are helping? [] Yes [x] No   Adherence strategy: Pill box; does report issues with having refills lefts on some prescriptions at [facility] and then is unable to get in touch with [facility] for refills. He is proactive about refilling his medications one week in advance and expresses frustration with the [facility] informing him he has no refills or are unable to find the prescription. Refill request will be sent for his carvedilol and potassium    Vital signs: HR 61, BP 150/84, weight (pounds) 284 ECHO: Date 10/28/2018, EF 60-65%, notes no LVH  BMP Latest Ref Rng & Units 02/24/2020 09/30/2019 07/23/2018  Glucose 65 - 99 mg/dL 150(H) 232(H) 154(H)  BUN 6 - 24 mg/dL 15 14 13   Creatinine 0.76 - 1.27 mg/dL 1.57(H) 1.74(H) 1.17  BUN/Creat Ratio 9 - 20 10 8(L) 11  Sodium 134 - 144 mmol/L 139 137 140  Potassium 3.5 - 5.2 mmol/L 4.1 3.8 3.8  Chloride 96 - 106 mmol/L 98 95(L) 103  CO2 20 - 29 mmol/L 24 25 21   Calcium 8.7 - 10.2 mg/dL 9.6 9.6 9.6    Past Medical History:  Diagnosis Date  . Asthma   . Cardiomyopathy ([place]) 2016   a. 01/2014 Echo: EF 25-30%;  b. ? ischemic vs non-ischemic.  He's never had an ischemic eval.  . Chronic combined systolic (congestive) and diastolic  (congestive) heart failure ([place])    a. 01/2014 Echo: EF 25-30%. b. Echo 03/2015: Improved EF of 40-45%  . COPD (chronic obstructive pulmonary disease) ([place])   . Depression   . Diabetes mellitus without complication ([place]) 4782   started med therapy approx 2018  . Hyperlipidemia   . Hypertensive cardiomyopathy ([place]) 06/30/2015  . Hypertensive heart disease    a. Since his 79's.  . Morbid obesity ([place])   . Obstructive sleep apnea    a. Has not used CPAP since ~ 2012.    ASSESSMENT 55 year old male who presents to the HF clinic for a follow up visit. He is complaining of frequent/excessive dry coughing that leads him to be dizzy. He has tried Robitussin with minimal relief and utilizes his inhalers with more relief. He had a short course of oral steroids and doxycycline which he claims were helpful. He does not report any SOB along with the coughing. Pt did inquire about if he could have ccs/abx since that was helpful in the past - informed him that typically prophylactic azithromycin is used and is based off of frequency/# of ED visits and that he would likely need to follow up with his PCP. Pt reports that the dose of his Advair inhaler was recently increased (within the past month).   Recent ED Visit (past 6 months): N/A  PLAN CHF -initially carvedilol was going to be increased to goal dose of  25 mg BID since SBP 150/84 and HR 91, however HR 91 was a misdocumentation and was meant to be 61. No changes made to carvedilol - continue 12.5 mg BID -continue Farxiga 37m daily, losartan 100 mg daily, and Kcl 258m BID and torsemide 40 mg daily (with additional 2072mPM if needed)  -continue daily weight checks  COPD -pt demonstrates/explains proper inhaler technique -continue 1 puff Advair 500-50 mcg/dose BID  -continue PRN albuterol -follow up with PCP if symptoms worsen or do not improve or if needed to use PRN albuterol on a daily basis  DM -04/20/2020 A1c 8.6% down from 10.6 on 02/24/2020;  was started on Ozempic in 04/2020 - continue Ozempic -continue Farxiga as above -continue insulin glargine and metformin 500 mg BID -pt did not bring in BG log; follow up with PCP to assess need for changes in current regimen  -encouraged pt to monitor BG daily and to keep a log to bring in to future appts  HLD -07/23/2018 LDL 73 - f/u with PCP for need to repeat lipid panel -continue atorvastatin 10 mg daily  Time spent: 15 minutes  SamSherilyn BankerharmD Pharmacy Resident  06/06/2020 12:18 PM    Current Outpatient Medications:  .  albuterol (VENTOLIN HFA) 108 (90 Base) MCG/ACT inhaler, Inhale 2 puffs into the lungs every 6 (six) hours as needed for wheezing or shortness of breath., Disp: 6.7 g, Rfl: 2 .  aspirin 81 MG EC tablet, TAKE ONE TABLET BY MOUTH EVERY DAY, Disp: 90 tablet, Rfl: 4 .  atorvastatin (LIPITOR) 10 MG tablet, TAKE ONE TABLET BY MOUTH EVERY DAY, Disp: 90 tablet, Rfl: 4 .  Blood Glucose Monitoring Suppl (RIGHTEST GM550 BLOOD GLUCOSE) w/Device KIT, AS DIRECTED, Disp: 1 kit, Rfl: 0 .  carvedilol (COREG) 12.5 MG tablet, Take 1 tablet (12.5 mg total) by mouth 2 (two) times daily with a meal., Disp: 180 tablet, Rfl: 3 .  cetirizine (ZYRTEC) 10 MG tablet, TAKE ONE TABLET BY MOUTH EVERY DAY, Disp: 90 tablet, Rfl: 1 .  dapagliflozin propanediol (FARXIGA) 10 MG TABS tablet, TAKE ONE TABLET BY MOUTH EVERY DAY BEFORE BREAKFAST (Patient not taking: Reported on 06/06/2020), Disp: 90 tablet, Rfl: 3 .  dapagliflozin propanediol (FARXIGA) 10 MG TABS tablet, TAKE ONE TABLET BY MOUTH EVERY DAY, Disp: 90 tablet, Rfl: 3 .  famotidine (PEPCID) 20 MG tablet, TAKE ONE TABLET BY MOUTH 2 TIMES A DAY, Disp: 60 tablet, Rfl: 1 .  Fluticasone-Salmeterol (ADVAIR) 500-50 MCG/DOSE AEPB, INHALE ONE PUFF INTO THE LUNGS EVERY MORNING AND AT BEDTIME (Patient taking differently: Inhale 1 puff into the lungs 2 (two) times daily.), Disp: 60 each, Rfl: 2 .  glucose blood test strip, AS DIRECTED, Disp: 100  strip, Rfl: 99 .  hydrALAZINE (APRESOLINE) 25 MG tablet, TAKE ONE TABLET BY MOUTH 2 TIMES A DAY, Disp: 180 tablet, Rfl: 1 .  Insulin Glargine (BASAGLAR KWIKPEN) 100 UNIT/ML, Inject 25 Units into the skin daily., Disp: 7.5 mL, Rfl: 5 .  insulin glargine (LANTUS) 100 UNIT/ML Solostar Pen, INJECT 25 UNITS INTO THE SKIN EVERY DAY (Patient not taking: Reported on 06/06/2020), Disp: 15 mL, Rfl: 4 .  Insulin Pen Needle 32G X 4 MM MISC, AS DIRECTED WITH INSULIN, Disp: 30 each, Rfl: 5 .  ipratropium-albuterol (DUONEB) 0.5-2.5 (3) MG/3ML SOLN, INHALE CONTENTS OF ONE VIAL USING NEBULIZER EVERY 4 HOURS AS NEEDED, Disp: 180 mL, Rfl: 1 .  losartan (COZAAR) 100 MG tablet, TAKE ONE TABLET BY MOUTH EVERY DAY, Disp: 90 tablet, Rfl: 4 .  metFORMIN (GLUCOPHAGE) 500 MG tablet, TAKE ONE TABLET BY MOUTH 2 TIMES A DAY WITH A MEAL. (Patient not taking: Reported on 06/06/2020), Disp: 180 tablet, Rfl: 0 .  metFORMIN (GLUCOPHAGE) 500 MG tablet, TAKE ONE TABLET BY MOUTH 2 TIMES A DAY WITH A MEAL., Disp: 42 tablet, Rfl: 0 .  montelukast (SINGULAIR) 10 MG tablet, TAKE ONE TABLET BY MOUTH AT BEDTIME, Disp: 30 tablet, Rfl: 1 .  potassium chloride SA (KLOR-CON) 20 MEQ tablet, TAKE ONE TABLET BY MOUTH 2 TIMES A DAY, Disp: 180 tablet, Rfl: 4 .  Rightest GL300 Lancets MISC, AS DIRECTED, Disp: 100 each, Rfl: 99 .  Semaglutide,0.25 or 0.5MG/DOS, (OZEMPIC, 0.25 OR 0.5 MG/DOSE,) 2 MG/1.5ML SOPN, INJECT 0.5MG INTO THE SKIN ONCE WEEKLY, Disp: 7.5 mL, Rfl: 0 .  torsemide (DEMADEX) 20 MG tablet, TAKE 2 TABLETS BY MOUTH TWO TIMES DAILY (Patient taking differently: Take 40 mg by mouth daily. May take additional 17m in the evening if needed), Disp: 120 tablet, Rfl: 1   COUNSELING POINTS/CLINICAL PEARLS Carvedilol (Goal: weight less than 85 kg is 25 mg BID, weight greater than 85 kg is 50 mg BID)  Patient should avoid activities requiring coordination until drug effects are realized, as drug may cause dizziness.  This drug may cause diarrhea,  nausea, vomiting, arthralgia, back pain, myalgia, headache, vision disorder, erectile dysfunction, reduced libido, or fatigue.  Instruct patient to report signs/symptoms of adverse cardiovascular effects such as hypotension (especially in elderly patients), arrhythmias, syncope, palpitations, angina, or edema.  Drug may mask symptoms of hypoglycemia. Advise diabetic patients to carefully monitor blood sugar levels.  Patient should take drug with food.  Advise patient against sudden discontinuation of drug. Losartan (Goal: 150 mg once daily)  Warn male patient to avoid pregnancy and to report a pregnancy that occurs during therapy.  Side effects may include dizziness, upper respiratory infection, nasal congestion, and back pain.  Warn patient to avoid use of potassium supplements or potassium-containing salt substitutes unless they consult healthcare provider.. Torsemide  Side effects may include excessive urination.  Tell patient to report symptoms of ototoxicity.  Instruct patient to report lightheadedness or syncope.  Warn patient to avoid use of nonprescription NSAID products without first discussing it with their healthcare provider.  DRUGS TO AVOID IN HEART FAILURE  Drug or Class Mechanism  Analgesics . NSAIDs . COX-2 inhibitors . Glucocorticoids  Sodium and water retention, increased systemic vascular resistance, decreased response to diuretics   Diabetes Medications . Metformin . Thiazolidinediones o Rosiglitazone (Avandia) o Pioglitazone (Actos) . DPP4 Inhibitors o Saxagliptin (Onglyza) o Sitagliptin (Januvia)   Lactic acidosis Possible calcium channel blockade   Unknown  Antiarrhythmics . Class I  o Flecainide o Disopyramide . Class III o Sotalol . Other o Dronedarone  Negative inotrope, proarrhythmic   Proarrhythmic, beta blockade  Negative inotrope  Antihypertensives . Alpha Blockers o Doxazosin . Calcium Channel  Blockers o Diltiazem o Verapamil o Nifedipine . Central Alpha Adrenergics o Moxonidine . Peripheral Vasodilators o Minoxidil  Increases renin and aldosterone  Negative inotrope    Possible sympathetic withdrawal  Unknown  Anti-infective . Itraconazole . Amphotericin B  Negative inotrope Unknown  Hematologic . Anagrelide . Cilostazol   Possible inhibition of PD IV Inhibition of PD III causing arrhythmias  Neurologic/Psychiatric . Stimulants . Anti-Seizure Drugs o Carbamazepine o Pregabalin . Antidepressants o Tricyclics o Citalopram . Parkinsons o Bromocriptine o Pergolide o Pramipexole . Antipsychotics o Clozapine . Antimigraine o Ergotamine o Methysergide . Appetite suppressants . Bipolar o Lithium  Peripheral alpha and beta agonist activity  Negative inotrope and chronotrope Calcium channel blockade  Negative inotrope, proarrhythmic Dose-dependent QT prolongation  Excessive serotonin activity/valvular damage Excessive serotonin activity/valvular damage Unknown  IgE mediated hypersensitivy, calcium channel blockade  Excessive serotonin activity/valvular damage Excessive serotonin activity/valvular damage Valvular damage  Direct myofibrillar degeneration, adrenergic stimulation  Antimalarials . Chloroquine . Hydroxychloroquine Intracellular inhibition of lysosomal enzymes  Urologic Agents . Alpha Blockers o Doxazosin o Prazosin o Tamsulosin o Terazosin  Increased renin and aldosterone  Adapted from Page RL, et al. "Drugs That May Cause or Exacerbate Heart Failure: A Scientific Statement from the [place]." Circulation 2016; 388:T19-L97. DOI: 10.1161/CIR.0000000000000426   MEDICATION ADHERENCES TIPS AND STRATEGIES 1. Taking medication as prescribed improves patient outcomes in heart failure (reduces hospitalizations, improves symptoms, increases survival) 2. Side effects of medications can be managed by decreasing  doses, switching agents, stopping drugs, or adding additional therapy. Please let someone in the [facility] know if you have having bothersome side effects so we can modify your regimen. Do not alter your medication regimen without talking to [place].  3. Medication reminders can help patients remember to take drugs on time. If you are missing or forgetting doses you can try linking behaviors, using pill boxes, or an electronic reminder like an alarm on your phone or an app. Some people can also get automated phone calls as medication reminders.

## 2020-06-07 ENCOUNTER — Other Ambulatory Visit: Payer: Self-pay

## 2020-06-07 ENCOUNTER — Encounter: Payer: Self-pay | Admitting: Family

## 2020-06-07 LAB — TROPONIN I (HIGH SENSITIVITY): Troponin I (High Sensitivity): 2 ng/L (ref <18)

## 2020-06-07 LAB — D-DIMER, QUANTITATIVE: D-Dimer, Quant: 0.29 ug/mL-FEU (ref 0.00–0.50)

## 2020-06-07 MED ORDER — METHOCARBAMOL 500 MG PO TABS
500.0000 mg | ORAL_TABLET | Freq: Three times a day (TID) | ORAL | 0 refills | Status: DC | PRN
  Filled 2020-06-07: qty 20, 7d supply, fill #0

## 2020-06-07 MED ORDER — HYDROCODONE-ACETAMINOPHEN 5-325 MG PO TABS: 1.0000 | ORAL_TABLET | ORAL | 0 refills | Status: DC | PRN

## 2020-06-07 MED ORDER — SODIUM CHLORIDE 0.9 % IV BOLUS
500.0000 mL | Freq: Once | INTRAVENOUS | Status: AC
  Administered 2020-06-07: 500 mL via INTRAVENOUS

## 2020-06-07 MED ORDER — KETOROLAC TROMETHAMINE 30 MG/ML IJ SOLN
30.0000 mg | Freq: Once | INTRAMUSCULAR | Status: AC
  Administered 2020-06-07: 30 mg via INTRAVENOUS
  Filled 2020-06-07: qty 1

## 2020-06-07 NOTE — ED Notes (Signed)
Hydrocodone prepack given per prescription.

## 2020-06-08 ENCOUNTER — Other Ambulatory Visit: Payer: Self-pay

## 2020-06-08 MED FILL — Hydrocodone-Acetaminophen Tab 5-325 MG: ORAL | Qty: 6 | Status: AC

## 2020-06-09 ENCOUNTER — Other Ambulatory Visit: Payer: Self-pay

## 2020-06-09 ENCOUNTER — Other Ambulatory Visit: Payer: Self-pay | Admitting: Family

## 2020-06-09 ENCOUNTER — Other Ambulatory Visit: Payer: Self-pay | Admitting: Gerontology

## 2020-06-09 DIAGNOSIS — J301 Allergic rhinitis due to pollen: Secondary | ICD-10-CM

## 2020-06-09 MED FILL — Aspirin Tab Delayed Release 81 MG: ORAL | 14 days supply | Qty: 14 | Fill #0 | Status: AC

## 2020-06-13 ENCOUNTER — Telehealth: Payer: Self-pay | Admitting: Gerontology

## 2020-06-13 ENCOUNTER — Other Ambulatory Visit: Payer: Self-pay

## 2020-06-13 MED FILL — Montelukast Sodium Tab 10 MG (Base Equiv): ORAL | 14 days supply | Qty: 14 | Fill #0 | Status: AC

## 2020-06-13 MED FILL — Cetirizine HCl Tab 10 MG: ORAL | 14 days supply | Qty: 14 | Fill #0 | Status: AC

## 2020-06-13 NOTE — Telephone Encounter (Signed)
Successfully scheduled appt.

## 2020-06-14 ENCOUNTER — Other Ambulatory Visit: Payer: Self-pay | Admitting: Family

## 2020-06-14 ENCOUNTER — Other Ambulatory Visit: Payer: Self-pay

## 2020-06-14 ENCOUNTER — Other Ambulatory Visit: Payer: Self-pay | Admitting: Internal Medicine

## 2020-06-14 MED ORDER — ATORVASTATIN CALCIUM 10 MG PO TABS
ORAL_TABLET | Freq: Every day | ORAL | 4 refills | Status: DC
  Filled 2020-06-14: qty 14, 14d supply, fill #0
  Filled 2020-07-10: qty 90, 90d supply, fill #1
  Filled 2020-09-28 – 2020-10-05 (×2): qty 90, 90d supply, fill #2
  Filled 2020-10-27: qty 90, 90d supply, fill #3

## 2020-06-15 ENCOUNTER — Other Ambulatory Visit: Payer: Self-pay

## 2020-06-15 ENCOUNTER — Other Ambulatory Visit: Payer: Self-pay | Admitting: Gerontology

## 2020-06-15 ENCOUNTER — Ambulatory Visit: Payer: Self-pay | Admitting: Gerontology

## 2020-06-15 ENCOUNTER — Encounter: Payer: Self-pay | Admitting: Gerontology

## 2020-06-15 VITALS — BP 116/72 | HR 103 | Temp 98.2°F | Resp 18 | Ht 69.0 in | Wt 284.4 lb

## 2020-06-15 DIAGNOSIS — I10 Essential (primary) hypertension: Secondary | ICD-10-CM

## 2020-06-15 DIAGNOSIS — J45901 Unspecified asthma with (acute) exacerbation: Secondary | ICD-10-CM

## 2020-06-15 DIAGNOSIS — J449 Chronic obstructive pulmonary disease, unspecified: Secondary | ICD-10-CM

## 2020-06-15 DIAGNOSIS — M79602 Pain in left arm: Secondary | ICD-10-CM

## 2020-06-15 DIAGNOSIS — E559 Vitamin D deficiency, unspecified: Secondary | ICD-10-CM

## 2020-06-15 DIAGNOSIS — J301 Allergic rhinitis due to pollen: Secondary | ICD-10-CM

## 2020-06-15 DIAGNOSIS — Z794 Long term (current) use of insulin: Secondary | ICD-10-CM

## 2020-06-15 DIAGNOSIS — R7989 Other specified abnormal findings of blood chemistry: Secondary | ICD-10-CM

## 2020-06-15 DIAGNOSIS — E1165 Type 2 diabetes mellitus with hyperglycemia: Secondary | ICD-10-CM

## 2020-06-15 MED ORDER — INSULIN GLARGINE 100 UNIT/ML SOLOSTAR PEN
PEN_INJECTOR | SUBCUTANEOUS | 4 refills | Status: DC
  Filled 2020-06-15: qty 15, fill #0
  Filled 2020-10-02: qty 15, 60d supply, fill #0
  Filled ????-??-??: fill #1

## 2020-06-15 MED ORDER — FLUTICASONE-SALMETEROL 500-50 MCG/ACT IN AEPB
1.0000 | INHALATION_SPRAY | Freq: Two times a day (BID) | RESPIRATORY_TRACT | 3 refills | Status: DC
  Filled 2020-06-15 – 2020-07-12 (×2): qty 60, 30d supply, fill #0
  Filled 2020-08-21: qty 60, 30d supply, fill #1
  Filled 2020-09-15: qty 120, 60d supply, fill #2

## 2020-06-15 MED ORDER — OZEMPIC (1 MG/DOSE) 2 MG/1.5ML ~~LOC~~ SOPN
1.0000 mg | PEN_INJECTOR | SUBCUTANEOUS | 1 refills | Status: DC
  Filled 2020-06-15 – 2020-07-25 (×3): qty 9, 84d supply, fill #0

## 2020-06-15 MED ORDER — VITAMIN D (ERGOCALCIFEROL) 1.25 MG (50000 UNIT) PO CAPS
50000.0000 [IU] | ORAL_CAPSULE | ORAL | 0 refills | Status: DC
  Filled 2020-06-15: qty 2, 14d supply, fill #0
  Filled 2020-06-25: qty 2, 14d supply, fill #1
  Filled 2020-07-10: qty 4, 28d supply, fill #1
  Filled 2020-08-09: qty 2, 14d supply, fill #2

## 2020-06-15 MED ORDER — NEBULIZER/TUBING/MOUTHPIECE KIT: 1.0000 | PACK | Freq: Once | 1 refills | Status: AC

## 2020-06-15 MED ORDER — OZEMPIC (1 MG/DOSE) 2 MG/1.5ML ~~LOC~~ SOPN: 1.0000 mg | PEN_INJECTOR | SUBCUTANEOUS | 1 refills | Status: DC

## 2020-06-15 MED ORDER — MONTELUKAST SODIUM 10 MG PO TABS
ORAL_TABLET | Freq: Every day | ORAL | 1 refills | Status: DC
  Filled 2020-07-10: qty 12, 12d supply, fill #0
  Filled 2020-07-19: qty 30, 30d supply, fill #1

## 2020-06-15 MED ORDER — IPRATROPIUM-ALBUTEROL 0.5-2.5 (3) MG/3ML IN SOLN
RESPIRATORY_TRACT | 1 refills | Status: DC
  Filled 2020-06-15: qty 180, fill #0
  Filled 2020-09-08: qty 180, 10d supply, fill #0

## 2020-06-15 MED ORDER — METFORMIN HCL 500 MG PO TABS
ORAL_TABLET | Freq: Two times a day (BID) | ORAL | 0 refills | Status: DC
  Filled 2020-06-15: qty 28, 14d supply, fill #0
  Filled 2020-07-25: qty 28, 14d supply, fill #1

## 2020-06-15 MED ORDER — ALBUTEROL SULFATE HFA 108 (90 BASE) MCG/ACT IN AERS
2.0000 | INHALATION_SPRAY | Freq: Four times a day (QID) | RESPIRATORY_TRACT | 2 refills | Status: DC | PRN
  Filled 2020-06-15: qty 6.7, 25d supply, fill #0
  Filled 2020-07-10: qty 6.7, 25d supply, fill #1
  Filled 2020-08-21: qty 6.7, 25d supply, fill #2

## 2020-06-15 NOTE — Patient Instructions (Signed)

## 2020-06-15 NOTE — Progress Notes (Signed)
Established Patient Office Visit  Subjective:  Patient ID: Bradley Shelnutt., male    DOB: 1965/12/09  Age: 55 y.o. MRN: [id number]  CC:  Chief Complaint  Patient presents with  . Follow-up    ED visit  . Hypertension  . Diabetes    HPI Bradley Mckenzie. is a 55 y.o. male who  has a past medical history of Asthma, Cardiomyopathy ([place]) (2016), Chronic combined systolic (congestive) and diastolic (congestive) heart failure ([place]), COPD (chronic obstructive pulmonary disease) ([place]), Depression, Diabetes mellitus without complication ([place]) (1696), Hyperlipidemia, Hypertensive cardiomyopathy ([place]) (06/30/2015), Hypertensive heart disease, Morbid obesity ([place]), and Obstructive sleep apnea.Patient  presents for follow up visit and medication refill. He was seen at the ED on 06/06/20 for Left knee pain which he reports that it has resolved. He also c/o intermittent non traumatic left shoulder pain/spasms that radiates to his left arm. He states that it's a combination of numbness and spasm. He states that left arm spasm  has been going on for a month. He denies muscle or motor weakness to left arm. His HgbA1c done on 04/20/20 was 8.6%, he states that he's compliant with his medications and adheres to ADA diet and exercises as tolerated. He checks his fasting blood glucose daily and his reading this morning was 145 mg/dl. His Serum creatinine on 06/06/20 was 1.70 mg/dl and eGFR was 48 and his Vitamin D done on 04/20/20 was 14.5 ng/ml. Overall, he states that he's doing well and offers no further complaint.  Past Medical History:  Diagnosis Date  . Asthma   . Cardiomyopathy ([place]) 2016   a. 01/2014 Echo: EF 25-30%;  b. ? ischemic vs non-ischemic.  He's never had an ischemic eval.  . Chronic combined systolic (congestive) and diastolic (congestive) heart failure ([place])    a. 01/2014 Echo: EF 25-30%. b. Echo 03/2015: Improved EF of 40-45%  . COPD (chronic obstructive pulmonary disease) ([place])   . Depression   .  Diabetes mellitus without complication ([place]) 7893   started med therapy approx 2018  . Hyperlipidemia   . Hypertensive cardiomyopathy ([place]) 06/30/2015  . Hypertensive heart disease    a. Since his 70's.  . Morbid obesity ([place])   . Obstructive sleep apnea    a. Has not used CPAP since ~ 2012.    Past Surgical History:  Procedure Laterality Date  . HERNIA REPAIR  2004    Family History  Problem Relation Age of Onset  . Heart disease Mother        alive & well.  [name] Heart attack Father 10       died @ 19 of cancer.  . Diabetes Father   . Cancer Father        "in his stomach"  . Cancer Sister   . Cancer Paternal Uncle        strong FH malignancy multiple relavies     Social History   Socioeconomic History  . Marital status: Married    Spouse name: Not on file  . Number of children: 2  . Years of education: 58  . Highest education level: GED or equivalent  Occupational History  . Occupation: unemployed    Comment: hard time getting disability, denied medicaid   Tobacco Use  . Smoking status: Never Smoker  . Smokeless tobacco: Never Used  . Tobacco comment: Quit in his 69's.  Vaping Use  . Vaping Use: Never used  Substance and Sexual Activity  . Alcohol use: Not Currently  Alcohol/week: 0.0 standard drinks    Comment: occassional alcohol use at special events  . Drug use: No    Comment: Used marijuana in his teens.  . Sexual activity: Not Currently  Other Topics Concern  . Not on file  Social History Narrative   Lives in [place] with his wife. Wife no longer working; now on disability. Have stable housing at group home that his wife used to work at. Sometimes struggles with transportation but not desperate. Wife has working car, his doesn't work. Had food stamps but cut off after 3 months. Wasn't eligible after that bc not able to work. Has been denied twice from disability. He was not able to obtain medicaid   Social Determinants of Health   Financial  Resource Strain: Low Risk   . Difficulty of Paying Living Expenses: Not hard at all  Food Insecurity: No Food Insecurity  . Worried About Charity fundraiser in the Last Year: Never true  . Ran Out of Food in the Last Year: Never true  Transportation Needs: No Transportation Needs  . Lack of Transportation (Medical): No  . Lack of Transportation (Non-Medical): No  Physical Activity: Insufficiently Active  . Days of Exercise per Week: 3 days  . Minutes of Exercise per Session: 20 min  Stress: No Stress Concern Present  . Feeling of Stress : Not at all  Social Connections: Moderately Isolated  . Frequency of Communication with Friends and Family: Once a week  . Frequency of Social Gatherings with Friends and Family: Once a week  . Attends Religious Services: 1 to 4 times per year  . Active Member of Clubs or Organizations: No  . Attends Archivist Meetings: Never  . Marital Status: Married  Human resources officer Violence: Not At Risk  . Fear of Current or Ex-Partner: No  . Emotionally Abused: No  . Physically Abused: No  . Sexually Abused: No    Outpatient Medications Prior to Visit  Medication Sig Dispense Refill  . aspirin 81 MG EC tablet TAKE ONE TABLET BY MOUTH EVERY DAY 90 tablet 4  . atorvastatin (LIPITOR) 10 MG tablet TAKE ONE TABLET BY MOUTH EVERY DAY 90 tablet 4  . Blood Glucose Monitoring Suppl (RIGHTEST GM550 BLOOD GLUCOSE) w/Device KIT AS DIRECTED 1 kit 0  . carvedilol (COREG) 12.5 MG tablet Take 1 tablet (12.5 mg total) by mouth 2 (two) times daily with a meal. 180 tablet 3  . cetirizine (ZYRTEC) 10 MG tablet TAKE ONE TABLET BY MOUTH EVERY DAY 90 tablet 1  . dapagliflozin propanediol (FARXIGA) 10 MG TABS tablet TAKE ONE TABLET BY MOUTH EVERY DAY BEFORE BREAKFAST 90 tablet 3  . famotidine (PEPCID) 20 MG tablet TAKE ONE TABLET BY MOUTH 2 TIMES A DAY 60 tablet 1  . glucose blood test strip AS DIRECTED 100 strip 99  . hydrALAZINE (APRESOLINE) 25 MG tablet TAKE ONE  TABLET BY MOUTH 2 TIMES A DAY 180 tablet 1  . Insulin Pen Needle 32G X 4 MM MISC AS DIRECTED WITH INSULIN 30 each 5  . losartan (COZAAR) 100 MG tablet TAKE ONE TABLET BY MOUTH EVERY DAY 90 tablet 4  . methocarbamol (ROBAXIN) 500 MG tablet Take 1 tablet (500 mg total) by mouth every 8 (eight) hours as needed for muscle spasms. 20 tablet 0  . potassium chloride SA (KLOR-CON) 20 MEQ tablet TAKE ONE TABLET BY MOUTH 2 TIMES A DAY 180 tablet 4  . Rightest GL300 Lancets MISC AS DIRECTED 100 each 99  .  torsemide (DEMADEX) 20 MG tablet TAKE 2 TABLETS BY MOUTH TWO TIMES DAILY (Patient taking differently: Take 40 mg by mouth daily. May take additional 66m in the evening if needed) 120 tablet 1  . albuterol (VENTOLIN HFA) 108 (90 Base) MCG/ACT inhaler Inhale 2 puffs into the lungs every 6 (six) hours as needed for wheezing or shortness of breath. 6.7 g 2  . Fluticasone-Salmeterol (ADVAIR) 500-50 MCG/DOSE AEPB INHALE ONE PUFF INTO THE LUNGS EVERY MORNING AND AT BEDTIME (Patient taking differently: Inhale 1 puff into the lungs 2 (two) times daily.) 60 each 2  . insulin glargine (LANTUS) 100 UNIT/ML Solostar Pen INJECT 25 UNITS INTO THE SKIN EVERY DAY 15 mL 4  . ipratropium-albuterol (DUONEB) 0.5-2.5 (3) MG/3ML SOLN INHALE CONTENTS OF ONE VIAL USING NEBULIZER EVERY 4 HOURS AS NEEDED 180 mL 1  . metFORMIN (GLUCOPHAGE) 500 MG tablet TAKE ONE TABLET BY MOUTH 2 TIMES A DAY WITH A MEAL. 42 tablet 0  . montelukast (SINGULAIR) 10 MG tablet TAKE ONE TABLET BY MOUTH AT BEDTIME 30 tablet 1  . Semaglutide,0.25 or 0.5MG/DOS, (OZEMPIC, 0.25 OR 0.5 MG/DOSE,) 2 MG/1.5ML SOPN INJECT 0.5MG INTO THE SKIN ONCE WEEKLY 7.5 mL 0  . carvedilol (COREG) 12.5 MG tablet Take 1 tablet (12.5 mg total) by mouth 2 (two) times daily with a meal. 180 tablet 3  . cetirizine (ZYRTEC) 10 MG tablet TAKE ONE TABLET BY MOUTH EVERY DAY 90 tablet 1  . dapagliflozin propanediol (FARXIGA) 10 MG TABS tablet TAKE ONE TABLET BY MOUTH EVERY DAY 90 tablet 3  .  HYDROcodone-acetaminophen (NORCO/VICODIN) 5-325 MG tablet Take 1-2 tablets by mouth every 4 (four) hours as needed. 6 tablet 0  . Insulin Glargine (BASAGLAR KWIKPEN) 100 UNIT/ML Inject 25 Units into the skin daily. 7.5 mL 5  . metFORMIN (GLUCOPHAGE) 500 MG tablet TAKE ONE TABLET BY MOUTH 2 TIMES A DAY WITH A MEAL. 180 tablet 0  . potassium chloride SA (KLOR-CON) 20 MEQ tablet TAKE ONE TABLET BY MOUTH 2 TIMES A DAY 180 tablet 4  . Semaglutide, 1 MG/DOSE, (OZEMPIC, 1 MG/DOSE,) 2 MG/1.5ML SOPN Inject 1 mg into the skin once a week. 9 mL 1   No facility-administered medications prior to visit.    No Known Allergies  ROS Review of Systems  Constitutional: Negative.   Eyes: Negative.   Respiratory: Negative.   Cardiovascular: Negative.   Endocrine: Negative.   Skin: Negative.   Neurological: Negative.   Psychiatric/Behavioral: Negative.       Objective:    Physical Exam HENT:     Head: Normocephalic and atraumatic.     Mouth/Throat:     Mouth: Mucous membranes are moist.  Eyes:     Extraocular Movements: Extraocular movements intact.     Conjunctiva/sclera: Conjunctivae normal.     Pupils: Pupils are equal, round, and reactive to light.  Cardiovascular:     Rate and Rhythm: Normal rate and regular rhythm.     Pulses: Normal pulses.     Heart sounds: Normal heart sounds.  Pulmonary:     Effort: Pulmonary effort is normal.     Breath sounds: Normal breath sounds.  Musculoskeletal:        General: Normal range of motion.     Cervical back: Normal range of motion.  Skin:    General: Skin is warm.  Neurological:     General: No focal deficit present.     Mental Status: He is alert and oriented to person, place, and time. Mental status is  at baseline.  Psychiatric:        Mood and Affect: Mood normal.        Behavior: Behavior normal.        Thought Content: Thought content normal.        Judgment: Judgment normal.     BP 116/72 (BP Location: Right Arm, Patient Position:  Sitting, Cuff Size: Large)   Pulse (!) 103   Temp 98.2 F (36.8 C) (Oral)   Resp 18   Ht 5' 9"  (1.753 m)   Wt 284 lb 6.4 oz (129 kg)   SpO2 94%   BMI 42.00 kg/m  Wt Readings from Last 3 Encounters:  06/15/20 284 lb 6.4 oz (129 kg)  06/06/20 284 lb (128.8 kg)  06/06/20 284 lb (128.8 kg)   Weight loss encouraged  Health Maintenance Due  Topic Date Due  . PNEUMOCOCCAL POLYSACCHARIDE VACCINE AGE 52-64 HIGH RISK  Never done  . COVID-19 Vaccine (1) Never done  . Pneumococcal Vaccine 25-57 Years old (1 of 4 - PCV13) Never done  . OPHTHALMOLOGY EXAM  Never done  . HIV Screening  Never done  . Hepatitis C Screening  Never done  . TETANUS/TDAP  Never done  . COLONOSCOPY (Pts 45-30yr Insurance coverage will need to be confirmed)  Never done  . Zoster Vaccines- Shingrix (1 of 2) Never done    There are no preventive care reminders to display for this patient.  Lab Results  Component Value Date   TSH 0.817 09/30/2019   Lab Results  Component Value Date   WBC 10.9 (H) 06/06/2020   HGB 13.9 06/06/2020   HCT 45.7 06/06/2020   MCV 86.7 06/06/2020   PLT 253 06/06/2020   Lab Results  Component Value Date   NA 137 06/15/2020   K 3.7 06/15/2020   CO2 22 06/15/2020   GLUCOSE 108 (H) 06/15/2020   BUN 12 06/15/2020   CREATININE 1.53 (H) 06/15/2020   BILITOT <0.2 02/24/2020   ALKPHOS 104 02/24/2020   AST 18 02/24/2020   ALT 28 02/24/2020   PROT 8.5 02/24/2020   ALBUMIN 4.7 02/24/2020   CALCIUM 9.6 06/15/2020   ANIONGAP 10 06/06/2020   EGFR 54 (L) 06/15/2020   Lab Results  Component Value Date   CHOL 167 09/30/2019   Lab Results  Component Value Date   HDL 44 09/30/2019   Lab Results  Component Value Date   LDLCALC 88 09/30/2019   Lab Results  Component Value Date   TRIG 210 (H) 09/30/2019   Lab Results  Component Value Date   CHOLHDL 3.8 09/30/2019   Lab Results  Component Value Date   HGBA1C 8.6 (H) 04/20/2020      Assessment & Plan:    1. Type 2  diabetes mellitus with hyperglycemia, with long-term current use of insulin (HCC) - His HgbA1c was 8.6%, and his goal should be less than 7%. He was encouraged to check his blood glucose bid, record and bring log to follow up appointment. He will continue on current medication, ADA diet and exercise as tolerated. - metFORMIN (GLUCOPHAGE) 500 MG tablet; TAKE ONE TABLET BY MOUTH 2 TIMES A DAY WITH A MEAL.  Dispense: 42 tablet; Refill: 0 - insulin glargine (LANTUS) 100 UNIT/ML Solostar Pen; INJECT 25 UNITS INTO THE SKIN EVERY DAY  Dispense: 15 mL; Refill: 4 - HgB A1c; Future - Semaglutide, 1 MG/DOSE, (OZEMPIC, 1 MG/DOSE,) 2 MG/1.5ML SOPN; Inject 1 mg into the skin once a week.  Dispense: 9 mL;  Refill: 1  2. Seasonal allergic rhinitis due to pollen - His symptoms are under control, will continue on current medication - montelukast (SINGULAIR) 10 MG tablet; TAKE ONE TABLET BY MOUTH AT BEDTIME  Dispense: 30 tablet; Refill: 1  3. Moderate asthma with exacerbation, unspecified whether persistent - His breathing is stable, and he will continue on current medication. - ipratropium-albuterol (DUONEB) 0.5-2.5 (3) MG/3ML SOLN; INHALE CONTENTS OF ONE VIAL USING NEBULIZER EVERY 4 HOURS AS NEEDED  Dispense: 180 mL; Refill: 1 - fluticasone-salmeterol (ADVAIR) 500-50 MCG/ACT AEPB; Inhale 1 puff into the lungs in the morning and at bedtime.  Dispense: 60 each; Refill: 3 - Respiratory Therapy Supplies (NEBULIZER/TUBING/MOUTHPIECE) KIT; 1 kit by Does not apply route once for 1 dose.  Dispense: 1 kit; Refill: 1  4. Chronic obstructive pulmonary disease, unspecified COPD type ([place]) -Same as #3 - ipratropium-albuterol (DUONEB) 0.5-2.5 (3) MG/3ML SOLN; INHALE CONTENTS OF ONE VIAL USING NEBULIZER EVERY 4 HOURS AS NEEDED  Dispense: 180 mL; Refill: 1 - albuterol (VENTOLIN HFA) 108 (90 Base) MCG/ACT inhaler; Inhale 2 puffs into the lungs every 6 (six) hours as needed for wheezing or shortness of breath.  Dispense: 6.7 g;  Refill: 2 - fluticasone-salmeterol (ADVAIR) 500-50 MCG/ACT AEPB; Inhale 1 puff into the lungs in the morning and at bedtime.  Dispense: 60 each; Refill: 3 - Respiratory Therapy Supplies (NEBULIZER/TUBING/MOUTHPIECE) KIT; 1 kit by Does not apply route once for 1 dose.  Dispense: 1 kit; Refill: 1   5. Vitamin D deficiency - His Vitamin D was low, he will continue on Ergocalciferol weekly and advised to get some sun light. - Vitamin D, Ergocalciferol, (DRISDOL) 1.25 MG (50000 UNIT) CAPS capsule; Take 1 capsule (50,000 Units total) by mouth every 7 (seven) days.  Dispense: 8 capsule; Refill: 0  6. Elevated serum creatinine - His Serum Creatinine was elevated and eGFR decreased, will recheck lab, advised to increase water intake. - Basic Metabolic Panel (BMET); Future - Basic Metabolic Panel (BMET)  7. Left arm pain - He was advised to monitor arm and notify clinic for worsening symptoms.    Follow-up: Return in about 6 weeks (around 07/26/2020), or if symptoms worsen or fail to improve.    Lynk Marti Jerold Coombe, NP

## 2020-06-16 ENCOUNTER — Other Ambulatory Visit: Payer: Self-pay

## 2020-06-16 LAB — BASIC METABOLIC PANEL
BUN/Creatinine Ratio: 8 — ABNORMAL LOW (ref 9–20)
BUN: 12 mg/dL (ref 6–24)
CO2: 22 mmol/L (ref 20–29)
Calcium: 9.6 mg/dL (ref 8.7–10.2)
Chloride: 99 mmol/L (ref 96–106)
Creatinine, Ser: 1.53 mg/dL — ABNORMAL HIGH (ref 0.76–1.27)
Glucose: 108 mg/dL — ABNORMAL HIGH (ref 65–99)
Potassium: 3.7 mmol/L (ref 3.5–5.2)
Sodium: 137 mmol/L (ref 134–144)
eGFR: 54 mL/min/{1.73_m2} — ABNORMAL LOW (ref >59)

## 2020-06-20 ENCOUNTER — Other Ambulatory Visit: Payer: Self-pay

## 2020-06-20 MED ORDER — INSULIN PEN NEEDLE 32G X 4 MM MISC
11 refills | Status: DC
  Filled 2020-06-20: qty 100, 100d supply, fill #0
  Filled 2020-10-01: qty 100, 100d supply, fill #1
  Filled 2021-01-09: qty 100, 100d supply, fill #2
  Filled 2021-03-23 – 2021-03-27 (×2): qty 100, 100d supply, fill #3

## 2020-06-21 ENCOUNTER — Other Ambulatory Visit: Payer: Self-pay

## 2020-06-21 ENCOUNTER — Telehealth: Payer: Self-pay | Admitting: Pharmacist

## 2020-06-21 MED FILL — Cetirizine HCl Tab 10 MG: ORAL | Qty: 90 | Fill #0 | Status: CN

## 2020-06-21 MED FILL — Aspirin Tab Delayed Release 81 MG: ORAL | 90 days supply | Qty: 90 | Fill #1 | Status: AC

## 2020-06-21 MED FILL — Hydralazine HCl Tab 25 MG: ORAL | 76 days supply | Qty: 152 | Fill #1 | Status: AC

## 2020-06-21 NOTE — Telephone Encounter (Signed)
Patient approved for medication assistance at [facility] until 05/14/21, as long as eligibility criteria continues to be met.   Vonda [facility] Administrative Assistant 

## 2020-06-26 ENCOUNTER — Other Ambulatory Visit: Payer: Self-pay

## 2020-06-27 ENCOUNTER — Other Ambulatory Visit: Payer: Self-pay

## 2020-06-28 ENCOUNTER — Telehealth: Payer: Self-pay | Admitting: Pharmacist

## 2020-06-28 NOTE — Telephone Encounter (Signed)
06/28/2020 10:04:29 AM - Advair form to pat & script to provider-renewal  -- Bradley Mckenzie - Wednesday, June 28, 2020 10:03 AM --Mailing patient his portion of [place] form to sign & return for renewal of Advair 500/50--sending script to [facility] for Benjamine Mola to sign for Advair.

## 2020-06-29 ENCOUNTER — Other Ambulatory Visit: Payer: Self-pay

## 2020-07-06 ENCOUNTER — Other Ambulatory Visit: Payer: Self-pay

## 2020-07-10 ENCOUNTER — Other Ambulatory Visit: Payer: Self-pay

## 2020-07-10 ENCOUNTER — Other Ambulatory Visit: Payer: Self-pay | Admitting: Gerontology

## 2020-07-10 DIAGNOSIS — K219 Gastro-esophageal reflux disease without esophagitis: Secondary | ICD-10-CM

## 2020-07-10 MED FILL — Cetirizine HCl Tab 10 MG: ORAL | 90 days supply | Qty: 90 | Fill #0 | Status: AC

## 2020-07-11 ENCOUNTER — Other Ambulatory Visit: Payer: Self-pay

## 2020-07-11 ENCOUNTER — Ambulatory Visit: Payer: Self-pay | Admitting: Family

## 2020-07-11 MED ORDER — FAMOTIDINE 20 MG PO TABS
ORAL_TABLET | Freq: Two times a day (BID) | ORAL | 1 refills | Status: DC
  Filled 2020-07-11: qty 60, 30d supply, fill #0
  Filled 2020-08-09: qty 60, 30d supply, fill #1

## 2020-07-12 ENCOUNTER — Other Ambulatory Visit: Payer: Self-pay

## 2020-07-13 ENCOUNTER — Other Ambulatory Visit: Payer: Self-pay

## 2020-07-14 ENCOUNTER — Other Ambulatory Visit: Payer: Self-pay

## 2020-07-18 ENCOUNTER — Other Ambulatory Visit: Payer: Self-pay

## 2020-07-18 MED FILL — Cetirizine HCl Tab 10 MG: ORAL | 90 days supply | Qty: 90 | Fill #1 | Status: CN

## 2020-07-19 ENCOUNTER — Other Ambulatory Visit: Payer: Self-pay

## 2020-07-19 DIAGNOSIS — E1165 Type 2 diabetes mellitus with hyperglycemia: Secondary | ICD-10-CM

## 2020-07-20 LAB — HEMOGLOBIN A1C
Est. average glucose Bld gHb Est-mCnc: 192 mg/dL
Hgb A1c MFr Bld: 8.3 % — ABNORMAL HIGH (ref 4.8–5.6)

## 2020-07-21 ENCOUNTER — Telehealth: Payer: Self-pay | Admitting: Pharmacist

## 2020-07-21 NOTE — Telephone Encounter (Signed)
07/21/2020 11:53:17 AM - Advair 500/50 renewal & script to [place] - Friday, July 21, 2020 11:52 AM --Faxed [facility] renewal and script for Dose increase-Advair 500/50 Inhale one puff into the lungs every morning and at bedtime.

## 2020-07-26 ENCOUNTER — Other Ambulatory Visit: Payer: Self-pay

## 2020-07-26 ENCOUNTER — Ambulatory Visit: Payer: Self-pay | Admitting: Gerontology

## 2020-07-26 ENCOUNTER — Encounter: Payer: Self-pay | Admitting: Gerontology

## 2020-07-26 ENCOUNTER — Telehealth: Payer: Self-pay | Admitting: Pharmacist

## 2020-07-26 ENCOUNTER — Other Ambulatory Visit: Payer: Self-pay | Admitting: Gerontology

## 2020-07-26 ENCOUNTER — Other Ambulatory Visit: Payer: Self-pay | Admitting: Family

## 2020-07-26 VITALS — BP 108/71 | HR 102 | Temp 98.4°F | Resp 18 | Ht 69.0 in | Wt 283.0 lb

## 2020-07-26 DIAGNOSIS — E1165 Type 2 diabetes mellitus with hyperglycemia: Secondary | ICD-10-CM

## 2020-07-26 DIAGNOSIS — J301 Allergic rhinitis due to pollen: Secondary | ICD-10-CM

## 2020-07-26 DIAGNOSIS — R059 Cough, unspecified: Secondary | ICD-10-CM

## 2020-07-26 DIAGNOSIS — I5032 Chronic diastolic (congestive) heart failure: Secondary | ICD-10-CM

## 2020-07-26 DIAGNOSIS — J449 Chronic obstructive pulmonary disease, unspecified: Secondary | ICD-10-CM

## 2020-07-26 DIAGNOSIS — Z794 Long term (current) use of insulin: Secondary | ICD-10-CM

## 2020-07-26 DIAGNOSIS — I10 Essential (primary) hypertension: Secondary | ICD-10-CM

## 2020-07-26 DIAGNOSIS — E559 Vitamin D deficiency, unspecified: Secondary | ICD-10-CM

## 2020-07-26 MED ORDER — BENZONATATE 100 MG PO CAPS
100.0000 mg | ORAL_CAPSULE | Freq: Three times a day (TID) | ORAL | 0 refills | Status: DC | PRN
  Filled 2020-07-26: qty 20, 7d supply, fill #0

## 2020-07-26 MED ORDER — PREDNISONE 10 MG PO TABS
10.0000 mg | ORAL_TABLET | Freq: Every day | ORAL | 0 refills | Status: DC
  Filled 2020-07-26: qty 5, 5d supply, fill #0

## 2020-07-26 MED ORDER — MONTELUKAST SODIUM 10 MG PO TABS
ORAL_TABLET | Freq: Every day | ORAL | 3 refills | Status: DC
  Filled 2020-07-26: qty 30, fill #0
  Filled 2020-09-08: qty 30, 30d supply, fill #0
  Filled 2020-10-05: qty 90, 90d supply, fill #1

## 2020-07-26 MED ORDER — OZEMPIC (1 MG/DOSE) 2 MG/1.5ML ~~LOC~~ SOPN
1.0000 mg | PEN_INJECTOR | SUBCUTANEOUS | 3 refills | Status: DC
  Filled 2020-07-26 – 2020-09-28 (×2): qty 9, 84d supply, fill #0
  Filled ????-??-??: fill #0

## 2020-07-26 MED ORDER — HYDRALAZINE HCL 25 MG PO TABS
ORAL_TABLET | Freq: Two times a day (BID) | ORAL | 1 refills | Status: DC
  Filled 2020-07-26: qty 180, fill #0
  Filled 2020-09-08: qty 36, 18d supply, fill #0
  Filled 2020-09-28: qty 60, 30d supply, fill #1
  Filled 2020-11-02: qty 120, 60d supply, fill #2

## 2020-07-26 MED ORDER — METFORMIN HCL 500 MG PO TABS
ORAL_TABLET | Freq: Two times a day (BID) | ORAL | 1 refills | Status: DC
  Filled 2020-07-26: qty 180, 90d supply, fill #0
  Filled 2020-11-02: qty 120, 60d supply, fill #1

## 2020-07-26 MED FILL — Torsemide Tab 20 MG: ORAL | 135 days supply | Qty: 270 | Fill #0 | Status: CN

## 2020-07-26 NOTE — Patient Instructions (Signed)
https://www.nhlbi.nih.gov/files/docs/public/heart/dash_brief.pdf">  DASH Eating Plan DASH stands for Dietary Approaches to Stop Hypertension. The DASH eating plan is a healthy eating plan that has been shown to: Reduce high blood pressure (hypertension). Reduce your risk for type 2 diabetes, heart disease, and stroke. Help with weight loss. What are tips for following this plan? Reading food labels Check food labels for the amount of salt (sodium) per serving. Choose foods with less than 5 percent of the Daily Value of sodium. Generally, foods with less than 300 milligrams (mg) of sodium per serving fit into this eating plan. To find whole grains, look for the word "whole" as the first word in the ingredient list. Shopping Buy products labeled as "low-sodium" or "no salt added." Buy fresh foods. Avoid canned foods and pre-made or frozen meals. Cooking Avoid adding salt when cooking. Use salt-free seasonings or herbs instead of table salt or sea salt. Check with your health care provider or pharmacist before using salt substitutes. Do not fry foods. Cook foods using healthy methods such as baking, boiling, grilling, roasting, and broiling instead. Cook with heart-healthy oils, such as olive, canola, avocado, soybean, or sunflower oil. Meal planning  Eat a balanced diet that includes: 4 or more servings of fruits and 4 or more servings of vegetables each day. Try to fill one-half of your plate with fruits and vegetables. 6-8 servings of whole grains each day. Less than 6 oz (170 g) of lean meat, poultry, or fish each day. A 3-oz (85-g) serving of meat is about the same size as a deck of cards. One egg equals 1 oz (28 g). 2-3 servings of low-fat dairy each day. One serving is 1 cup (237 mL). 1 serving of nuts, seeds, or beans 5 times each week. 2-3 servings of heart-healthy fats. Healthy fats called omega-3 fatty acids are found in foods such as walnuts, flaxseeds, fortified milks, and eggs.  These fats are also found in cold-water fish, such as sardines, salmon, and mackerel. Limit how much you eat of: Canned or prepackaged foods. Food that is high in trans fat, such as some fried foods. Food that is high in saturated fat, such as fatty meat. Desserts and other sweets, sugary drinks, and other foods with added sugar. Full-fat dairy products. Do not salt foods before eating. Do not eat more than 4 egg yolks a week. Try to eat at least 2 vegetarian meals a week. Eat more home-cooked food and less restaurant, buffet, and fast food.  Lifestyle When eating at a restaurant, ask that your food be prepared with less salt or no salt, if possible. If you drink alcohol: Limit how much you use to: 0-1 drink a day for women who are not pregnant. 0-2 drinks a day for men. Be aware of how much alcohol is in your drink. In the [place] one drink equals one 12 oz bottle of beer (355 mL), one 5 oz glass of wine (148 mL), or one 1 oz glass of hard liquor (44 mL). General information Avoid eating more than 2,300 mg of salt a day. If you have hypertension, you may need to reduce your sodium intake to 1,500 mg a day. Work with your health care provider to maintain a healthy body weight or to lose weight. Ask what an ideal weight is for you. Get at least 30 minutes of exercise that causes your heart to beat faster (aerobic exercise) most days of the week. Activities may include walking, swimming, or biking. Work with your health care provider   or dietitian to adjust your eating plan to your individual calorie needs. What foods should I eat? Fruits All fresh, dried, or frozen fruit. Canned fruit in natural juice (without addedsugar). Vegetables Fresh or frozen vegetables (raw, steamed, roasted, or grilled). Low-sodium or reduced-sodium tomato and vegetable juice. Low-sodium or reduced-sodium tomatosauce and tomato paste. Low-sodium or reduced-sodium canned vegetables. Grains Whole-grain or  whole-wheat bread. Whole-grain or whole-wheat pasta. Brown rice. Oatmeal. Quinoa. Bulgur. Whole-grain and low-sodium cereals. Pita bread.Low-fat, low-sodium crackers. Whole-wheat flour tortillas. Meats and other proteins Skinless chicken or turkey. Ground chicken or turkey. Pork with fat trimmed off. Fish and seafood. Egg whites. Dried beans, peas, or lentils. Unsalted nuts, nut butters, and seeds. Unsalted canned beans. Lean cuts of beef with fat trimmed off. Low-sodium, lean precooked or cured meat, such as sausages or meatloaves. Dairy Low-fat (1%) or fat-free (skim) milk. Reduced-fat, low-fat, or fat-free cheeses. Nonfat, low-sodium ricotta or cottage cheese. Low-fat or nonfatyogurt. Low-fat, low-sodium cheese. Fats and oils Soft margarine without trans fats. Vegetable oil. Reduced-fat, low-fat, or light mayonnaise and salad dressings (reduced-sodium). Canola, safflower, olive, avocado, soybean, andsunflower oils. Avocado. Seasonings and condiments Herbs. Spices. Seasoning mixes without salt. Other foods Unsalted popcorn and pretzels. Fat-free sweets. The items listed above may not be a complete list of foods and beverages you can eat. Contact a dietitian for more information. What foods should I avoid? Fruits Canned fruit in a light or heavy syrup. Fried fruit. Fruit in cream or buttersauce. Vegetables Creamed or fried vegetables. Vegetables in a cheese sauce. Regular canned vegetables (not low-sodium or reduced-sodium). Regular canned tomato sauce and paste (not low-sodium or reduced-sodium). Regular tomato and vegetable juice(not low-sodium or reduced-sodium). Pickles. Olives. Grains Baked goods made with fat, such as croissants, muffins, or some breads. Drypasta or rice meal packs. Meats and other proteins Fatty cuts of meat. Ribs. Fried meat. Bacon. Bologna, salami, and other precooked or cured meats, such as sausages or meat loaves. Fat from the back of a pig (fatback). Bratwurst.  Salted nuts and seeds. Canned beans with added salt. Canned orsmoked fish. Whole eggs or egg yolks. Chicken or turkey with skin. Dairy Whole or 2% milk, cream, and half-and-half. Whole or full-fat cream cheese. Whole-fat or sweetened yogurt. Full-fat cheese. Nondairy creamers. Whippedtoppings. Processed cheese and cheese spreads. Fats and oils Butter. Stick margarine. Lard. Shortening. Ghee. Bacon fat. Tropical oils, suchas coconut, palm kernel, or palm oil. Seasonings and condiments Onion salt, garlic salt, seasoned salt, table salt, and sea salt. Worcestershire sauce. Tartar sauce. Barbecue sauce. Teriyaki sauce. Soy sauce, including reduced-sodium. Steak sauce. Canned and packaged gravies. Fish sauce. Oyster sauce. Cocktail sauce. Store-bought horseradish. Ketchup. Mustard. Meat flavorings and tenderizers. Bouillon cubes. Hot sauces. Pre-made or packaged marinades. Pre-made or packaged taco seasonings. Relishes. Regular saladdressings. Other foods Salted popcorn and pretzels. The items listed above may not be a complete list of foods and beverages you should avoid. Contact a dietitian for more information. Where to find more information National Heart, Lung, and Blood Institute: www.nhlbi.nih.gov American Heart Association: www.heart.org Academy of Nutrition and Dietetics: www.eatright.org National Kidney Foundation: www.kidney.org Summary The DASH eating plan is a healthy eating plan that has been shown to reduce high blood pressure (hypertension). It may also reduce your risk for type 2 diabetes, heart disease, and stroke. When on the DASH eating plan, aim to eat more fresh fruits and vegetables, whole grains, lean proteins, low-fat dairy, and heart-healthy fats. With the DASH eating plan, you should limit salt (sodium) intake to 2,300   mg a day. If you have hypertension, you may need to reduce your sodium intake to 1,500 mg a day. Work with your health care provider or dietitian to adjust  your eating plan to your individual calorie needs. This information is not intended to replace advice given to you by your health care provider. Make sure you discuss any questions you have with your healthcare provider. Document Revised: 12/04/2018 Document Reviewed: 12/04/2018 Elsevier Patient Education  2022 [employer]. https://www.diabeteseducator.org/docs/default-source/living-with-diabetes/conquering-the-grocery-store-v1.pdf?sfvrsn=4">  Carbohydrate Counting for Diabetes Mellitus, Adult Carbohydrate counting is a method of keeping track of how many carbohydrates you eat. Eating carbohydrates naturally increases the amount of sugar (glucose) in the blood. Counting how many carbohydrates you eat improves your bloodglucose control, which helps you manage your diabetes. It is important to know how many carbohydrates you can safely have in each meal. This is different for every person. A dietitian can help you make a meal plan and calculate how many carbohydrates you should have at each meal andsnack. What foods contain carbohydrates? Carbohydrates are found in the following foods: Grains, such as breads and cereals. Dried beans and soy products. Starchy vegetables, such as potatoes, peas, and corn. Fruit and fruit juices. Milk and yogurt. Sweets and snack foods, such as cake, cookies, candy, chips, and soft drinks. How do I count carbohydrates in foods? There are two ways to count carbohydrates in food. You can read food labels or learn standard serving sizes of foods. You can use either of the methods or acombination of both. Using the Nutrition Facts label The Nutrition Facts list is included on the labels of almost all packaged foods and beverages in the [place] It includes: The serving size. Information about nutrients in each serving, including the grams (g) of carbohydrate per serving. To use the Nutrition Facts: Decide how many servings you will have. Multiply the number of servings  by the number of carbohydrates per serving. The resulting number is the total amount of carbohydrates that you will be having. Learning the standard serving sizes of foods When you eat carbohydrate foods that are not packaged or do not include Nutrition Facts on the label, you need to measure the servings in order to count the amount of carbohydrates. Measure the foods that you will eat with a food scale or measuring cup, if needed. Decide how many standard-size servings you will eat. Multiply the number of servings by 15. For foods that contain carbohydrates, one serving equals 15 g of carbohydrates. For example, if you eat 2 cups or 10 oz (300 g) of strawberries, you will have eaten 2 servings and 30 g of carbohydrates (2 servings x 15 g = 30 g). For foods that have more than one food mixed, such as soups and casseroles, you must count the carbohydrates in each food that is included. The following list contains standard serving sizes of common carbohydrate-rich foods. Each of these servings has about 15 g of carbohydrates: 1 slice of bread. 1 six-inch (15 cm) tortilla. ? cup or 2 oz (53 g) cooked rice or pasta.  cup or 3 oz (85 g) cooked or canned, drained and rinsed beans or lentils.  cup or 3 oz (85 g) starchy vegetable, such as peas, corn, or squash.  cup or 4 oz (120 g) hot cereal.  cup or 3 oz (85 g) boiled or mashed potatoes, or  or 3 oz (85 g) of a large baked potato.  cup or 4 fl oz (118 mL) fruit juice. 1 cup or  8 fl oz (237 mL) milk. 1 small or 4 oz (106 g) apple.  or 2 oz (63 g) of a medium banana. 1 cup or 5 oz (150 g) strawberries. 3 cups or 1 oz (24 g) popped popcorn. What is an example of carbohydrate counting? To calculate the number of carbohydrates in this sample meal, follow the stepsshown below. Sample meal 3 oz (85 g) chicken breast. ? cup or 4 oz (106 g) brown rice.  cup or 3 oz (85 g) corn. 1 cup or 8 fl oz (237 mL) milk. 1 cup or 5 oz (150 g)  strawberries with sugar-free whipped topping. Carbohydrate calculation Identify the foods that contain carbohydrates: Rice. Corn. Milk. Strawberries. Calculate how many servings you have of each food: 2 servings rice. 1 serving corn. 1 serving milk. 1 serving strawberries. Multiply each number of servings by 15 g: 2 servings rice x 15 g = 30 g. 1 serving corn x 15 g = 15 g. 1 serving milk x 15 g = 15 g. 1 serving strawberries x 15 g = 15 g. Add together all of the amounts to find the total grams of carbohydrates eaten: 30 g + 15 g + 15 g + 15 g = 75 g of carbohydrates total. What are tips for following this plan? Shopping Develop a meal plan and then make a shopping list. Buy fresh and frozen vegetables, fresh and frozen fruit, dairy, eggs, beans, lentils, and whole grains. Look at food labels. Choose foods that have more fiber and less sugar. Avoid processed foods and foods with added sugars. Meal planning Aim to have the same amount of carbohydrates at each meal and for each snack time. Plan to have regular, balanced meals and snacks. Where to find more information American Diabetes Association: www.diabetes.org Centers for Disease Control and Prevention: http://www.wolf.info/ Summary Carbohydrate counting is a method of keeping track of how many carbohydrates you eat. Eating carbohydrates naturally increases the amount of sugar (glucose) in the blood. Counting how many carbohydrates you eat improves your blood glucose control, which helps you manage your diabetes. A dietitian can help you make a meal plan and calculate how many carbohydrates you should have at each meal and snack. This information is not intended to replace advice given to you by your health care provider. Make sure you discuss any questions you have with your healthcare provider. Document Revised: 12/31/2018 Document Reviewed: 01/01/2019 Elsevier Patient Education  2021 [employer].

## 2020-07-26 NOTE — Telephone Encounter (Signed)
07/26/2020 11:08:46 AM - Ozempic renewal & dose change  -- Elmer Picker - Wednesday, July 26, 2020 11:06 AM --Velva Harman brought me a Dose Change for Ozempic now Inject '1mg'$  into the skin once a week. Patient Enrollment needs to be renewed-Printed [employer] application will put in bag for patient to sign, also sending Benjamine Mola her portion to sign.

## 2020-07-26 NOTE — Progress Notes (Signed)
Established Patient Office Visit  Subjective:  Patient ID: Bradley Gendreau., male    DOB: 21-Dec-1965  Age: 55 y.o. MRN: [id number]  CC:  Chief Complaint  Patient presents with   Follow-up    Lab done 07/19/20   Diabetes    Patient is checking blood sugars at home and usually run 112-120 fasting   Medication Refill    Metformin    HPI Bradley Chery. is a 55 y.o. male who  has a past medical history of Asthma, Cardiomyopathy ([place]) (2016), Chronic combined systolic (congestive) and diastolic (congestive) heart failure ([place]), COPD (chronic obstructive pulmonary disease) ([place]), Depression, Diabetes mellitus without complication ([place]) (6761), Hyperlipidemia, Hypertensive cardiomyopathy ([place]) (06/30/2015), Hypertensive heart disease, Morbid obesity ([place]), and Obstructive sleep apnea, presents for routine follow up visit , lab review and medication refill. He states that his breathing is stable, continues to experience productive cough, and he can't remember the color of his phlegm. He denies chest tightness, uses his albuterol 4 times daily and continues to wheeze. His HgbA1c done on 07/19/20 decreased from 8.6% to 8.3%. He checks his blood glucose bid and states that his fasting readings are usually less than 140 mg/dl. He denies hypo/hyperglycemic symptoms, peripheral neuropathy, performs daily foot checks and continues to work on adhering to [place]. Overall, he states that he's doing well and offers no further complaint.  Past Medical History:  Diagnosis Date   Asthma    Cardiomyopathy ([place]) 2016   a. 01/2014 Echo: EF 25-30%;  b. ? ischemic vs non-ischemic.  He's never had an ischemic eval.   Chronic combined systolic (congestive) and diastolic (congestive) heart failure ([place])    a. 01/2014 Echo: EF 25-30%. b. Echo 03/2015: Improved EF of 40-45%   COPD (chronic obstructive pulmonary disease) ([place])    Depression    Diabetes mellitus without complication ([place]) 9509   started med therapy  approx 2018   Hyperlipidemia    Hypertensive cardiomyopathy ([place]) 06/30/2015   Hypertensive heart disease    a. Since his 45's.   Morbid obesity ([place])    Obstructive sleep apnea    a. Has not used CPAP since ~ 2012.    Past Surgical History:  Procedure Laterality Date   HERNIA REPAIR  2004    Family History  Problem Relation Age of Onset   Heart disease Mother        alive & well.   Heart attack Father 52       died @ 8 of cancer.   Diabetes Father    Cancer Father        "in his stomach"   Cancer Sister    Cancer Paternal Uncle        strong FH malignancy multiple relavies     Social History   Socioeconomic History   Marital status: Married    Spouse name: Not on file   Number of children: 2   Years of education: 12   Highest education level: GED or equivalent  Occupational History   Occupation: unemployed    Comment: hard time getting disability, denied medicaid   Tobacco Use   Smoking status: Never   Smokeless tobacco: Never   Tobacco comments:    Quit in his 74's.  Vaping Use   Vaping Use: Never used  Substance and Sexual Activity   Alcohol use: Not Currently    Alcohol/week: 0.0 standard drinks    Comment: occassional alcohol use at special events   Drug use: No  Comment: Used marijuana in his teens.   Sexual activity: Not Currently  Other Topics Concern   Not on file  Social History Narrative   Lives in [place] with his wife. Wife no longer working; now on disability. Have stable housing at group home that his wife used to work at. Sometimes struggles with transportation but not desperate. Wife has working car, his doesn't work. Had food stamps but cut off after 3 months. Wasn't eligible after that bc not able to work. Has been denied twice from disability. He was not able to obtain medicaid   Social Determinants of Health   Financial Resource Strain: Low Risk    Difficulty of Paying Living Expenses: Not hard at all  Food Insecurity: No Food  Insecurity   Worried About Charity fundraiser in the Last Year: Never true   Arboriculturist in the Last Year: Never true  Transportation Needs: No Transportation Needs   Lack of Transportation (Medical): No   Lack of Transportation (Non-Medical): No  Physical Activity: Insufficiently Active   Days of Exercise per Week: 3 days   Minutes of Exercise per Session: 20 min  Stress: No Stress Concern Present   Feeling of Stress : Not at all  Social Connections: Moderately Isolated   Frequency of Communication with Friends and Family: Once a week   Frequency of Social Gatherings with Friends and Family: Once a week   Attends Religious Services: 1 to 4 times per year   Active Member of [employer] or Organizations: No   Attends Music therapist: Never   Marital Status: Married  Human resources officer Violence: Not At Risk   Fear of Current or Ex-Partner: No   Emotionally Abused: No   Physically Abused: No   Sexually Abused: No    Outpatient Medications Prior to Visit  Medication Sig Dispense Refill   albuterol (VENTOLIN HFA) 108 (90 Base) MCG/ACT inhaler Inhale 2 puffs into the lungs every 6 (six) hours as needed for wheezing or shortness of breath. 6.7 g 2   aspirin 81 MG EC tablet TAKE ONE TABLET BY MOUTH EVERY DAY 90 tablet 4   atorvastatin (LIPITOR) 10 MG tablet TAKE ONE TABLET BY MOUTH EVERY DAY 90 tablet 4   Blood Glucose Monitoring Suppl (RIGHTEST GM550 BLOOD GLUCOSE) w/Device KIT AS DIRECTED 1 kit 0   cetirizine (ZYRTEC) 10 MG tablet TAKE ONE TABLET BY MOUTH EVERY DAY 90 tablet 1   dapagliflozin propanediol (FARXIGA) 10 MG TABS tablet TAKE ONE TABLET BY MOUTH EVERY DAY BEFORE BREAKFAST 90 tablet 3   famotidine (PEPCID) 20 MG tablet TAKE ONE TABLET BY MOUTH TWICE DAILY. 60 tablet 1   fluticasone-salmeterol (ADVAIR) 500-50 MCG/ACT AEPB Inhale 1 puff into the lungs in the morning and at bedtime. 60 each 3   glucose blood test strip AS DIRECTED 100 strip 99   insulin glargine  (LANTUS) 100 UNIT/ML Solostar Pen INJECT 25 UNITS INTO THE SKIN EVERY DAY 15 mL 4   Insulin Pen Needle 32G X 4 MM MISC Use as directed 100 each 11   ipratropium-albuterol (DUONEB) 0.5-2.5 (3) MG/3ML SOLN INHALE CONTENTS OF ONE VIAL USING NEBULIZER EVERY 4 HOURS AS NEEDED 180 mL 1   losartan (COZAAR) 100 MG tablet TAKE ONE TABLET BY MOUTH EVERY DAY 90 tablet 4   potassium chloride SA (KLOR-CON) 20 MEQ tablet TAKE ONE TABLET BY MOUTH 2 TIMES A DAY 180 tablet 4   Rightest GL300 Lancets MISC AS DIRECTED 100 each 99  Vitamin D, Ergocalciferol, (DRISDOL) 1.25 MG (50000 UNIT) CAPS capsule Take 1 capsule (50,000 Units total) by mouth every 7 (seven) days. 8 capsule 0   carvedilol (COREG) 12.5 MG tablet Take 1 tablet (12.5 mg total) by mouth 2 (two) times daily with a meal. 180 tablet 3   hydrALAZINE (APRESOLINE) 25 MG tablet TAKE ONE TABLET BY MOUTH 2 TIMES A DAY 180 tablet 1   metFORMIN (GLUCOPHAGE) 500 MG tablet TAKE ONE TABLET BY MOUTH 2 TIMES A DAY WITH A MEAL. 42 tablet 0   methocarbamol (ROBAXIN) 500 MG tablet Take 1 tablet (500 mg total) by mouth every 8 (eight) hours as needed for muscle spasms. 20 tablet 0   montelukast (SINGULAIR) 10 MG tablet TAKE ONE TABLET BY MOUTH AT BEDTIME 30 tablet 1   Semaglutide, 1 MG/DOSE, (OZEMPIC, 1 MG/DOSE,) 2 MG/1.5ML SOPN Inject 1 mg into the skin once a week. 9 mL 1   torsemide (DEMADEX) 20 MG tablet TAKE 2 TABLETS BY MOUTH TWO TIMES DAILY (Patient taking differently: Take 40 mg by mouth daily. May take additional $RemoveBeforeD'20mg'zDdngYMPfrbLWH$  in the evening if needed) 120 tablet 1   carvedilol (COREG) 12.5 MG tablet Take 1 tablet (12.5 mg total) by mouth 2 (two) times daily with a meal. 180 tablet 3   cetirizine (ZYRTEC) 10 MG tablet TAKE ONE TABLET BY MOUTH EVERY DAY 90 tablet 1   dapagliflozin propanediol (FARXIGA) 10 MG TABS tablet TAKE ONE TABLET BY MOUTH EVERY DAY 90 tablet 3   No facility-administered medications prior to visit.    No Known Allergies  ROS Review of Systems   Constitutional: Negative.   Eyes: Negative.   Respiratory: Negative.    Cardiovascular: Negative.   Endocrine: Negative.   Skin: Negative.   Neurological: Negative.   Psychiatric/Behavioral: Negative.       Objective:    Physical Exam HENT:     Head: Normocephalic and atraumatic.  Eyes:     Extraocular Movements: Extraocular movements intact.     Conjunctiva/sclera: Conjunctivae normal.     Pupils: Pupils are equal, round, and reactive to light.  Cardiovascular:     Rate and Rhythm: Normal rate and regular rhythm.     Pulses: Normal pulses.     Heart sounds: Normal heart sounds.  Pulmonary:     Effort: Pulmonary effort is normal.     Breath sounds: Normal breath sounds.  Skin:    General: Skin is warm.  Neurological:     General: No focal deficit present.     Mental Status: He is alert and oriented to person, place, and time. Mental status is at baseline.  Psychiatric:        Mood and Affect: Mood normal.        Behavior: Behavior normal.        Thought Content: Thought content normal.        Judgment: Judgment normal.    BP 108/71 (BP Location: Right Arm, Patient Position: Sitting, Cuff Size: Large)   Pulse (!) 102   Temp 98.4 F (36.9 C)   Resp 18   Ht $R'5\' 9"'Yr$  (1.753 m)   Wt 283 lb (128.4 kg)   SpO2 95%   BMI 41.79 kg/m  Wt Readings from Last 3 Encounters:  07/26/20 283 lb (128.4 kg)  07/19/20 284 lb (128.8 kg)  06/15/20 284 lb 6.4 oz (129 kg)   Encouraged weight loss  Health Maintenance Due  Topic Date Due   PNEUMOCOCCAL POLYSACCHARIDE VACCINE AGE 77-64 HIGH RISK  Never done   COVID-19 Vaccine (1) Never done   Pneumococcal Vaccine 28-48 Years old (1 - PCV) Never done   OPHTHALMOLOGY EXAM  Never done   HIV Screening  Never done   Hepatitis C Screening  Never done   TETANUS/TDAP  Never done   Zoster Vaccines- Shingrix (1 of 2) Never done   COLONOSCOPY (Pts 45-65yrs Insurance coverage will need to be confirmed)  Never done    There are no preventive  care reminders to display for this patient.  Lab Results  Component Value Date   TSH 0.817 09/30/2019   Lab Results  Component Value Date   WBC 10.9 (H) 06/06/2020   HGB 13.9 06/06/2020   HCT 45.7 06/06/2020   MCV 86.7 06/06/2020   PLT 253 06/06/2020   Lab Results  Component Value Date   NA 137 06/15/2020   K 3.7 06/15/2020   CO2 22 06/15/2020   GLUCOSE 108 (H) 06/15/2020   BUN 12 06/15/2020   CREATININE 1.53 (H) 06/15/2020   BILITOT <0.2 02/24/2020   ALKPHOS 104 02/24/2020   AST 18 02/24/2020   ALT 28 02/24/2020   PROT 8.5 02/24/2020   ALBUMIN 4.7 02/24/2020   CALCIUM 9.6 06/15/2020   ANIONGAP 10 06/06/2020   EGFR 54 (L) 06/15/2020   Lab Results  Component Value Date   CHOL 167 09/30/2019   Lab Results  Component Value Date   HDL 44 09/30/2019   Lab Results  Component Value Date   LDLCALC 88 09/30/2019   Lab Results  Component Value Date   TRIG 210 (H) 09/30/2019   Lab Results  Component Value Date   CHOLHDL 3.8 09/30/2019   Lab Results  Component Value Date   HGBA1C 8.3 (H) 07/19/2020      Assessment & Plan:    1. Type 2 diabetes mellitus with hyperglycemia, with long-term current use of insulin (HCC) -His hemoglobin A1c was 8.3%, his goal should be less than 7%.  He will continue on current medication, low-carb/no concentrated sweet diet and exercise as tolerated. - metFORMIN (GLUCOPHAGE) 500 MG tablet; TAKE ONE TABLET BY MOUTH 2 TIMES A DAY WITH A MEAL.  Dispense: 180 tablet; Refill: 1 - Semaglutide, 1 MG/DOSE, (OZEMPIC, 1 MG/DOSE,) 2 MG/1.5ML SOPN; Inject 1 mg into the skin once a week.  Dispense: 9 mL; Refill: 3 - HgB A1c; Future  2. Seasonal allergic rhinitis due to pollen -His allergy is under control and he will continue on current medicine. - montelukast (SINGULAIR) 10 MG tablet; TAKE ONE TABLET BY MOUTH AT BEDTIME  Dispense: 30 tablet; Refill: 3  3. Essential hypertension -His blood pressure is under control and he will continue on  current medication. - hydrALAZINE (APRESOLINE) 25 MG tablet; TAKE ONE TABLET BY MOUTH 2 TIMES A DAY  Dispense: 180 tablet; Refill: 1 - Basic Metabolic Panel (BMET); Future  4. Vitamin D deficiency  -We will check Vitamin D (25 hydroxy) to see if he needs refill.  5. Chronic obstructive pulmonary disease, unspecified COPD type ([place]) -He has expiratory wheezing on due to his comorbidity, he will continue on Prednisone. He was educated on medication side effects and to notify clinic. - predniSONE (DELTASONE) 10 MG tablet; Take 1 tablet (10 mg total) by mouth daily with breakfast.  Dispense: 5 tablet; Refill: 0  6. Cough - He will continue Tessalon perles, educated on medication side effects and to notify clinic. - benzonatate (TESSALON PERLES) 100 MG capsule; Take 1 capsule (100 mg total) by mouth  3 (three) times daily as needed for cough.  Dispense: 20 capsule; Refill: 0    Follow-up: Return in about 3 months (around 11/09/2020), or if symptoms worsen or fail to improve.    Bradley Mckenzie Jerold Coombe, NP

## 2020-07-27 ENCOUNTER — Other Ambulatory Visit: Payer: Self-pay

## 2020-07-27 LAB — VITAMIN D 25 HYDROXY (VIT D DEFICIENCY, FRACTURES): Vit D, 25-Hydroxy: 33.2 ng/mL (ref 30.0–100.0)

## 2020-08-01 ENCOUNTER — Other Ambulatory Visit: Payer: Self-pay

## 2020-08-03 ENCOUNTER — Other Ambulatory Visit: Payer: Self-pay

## 2020-08-04 ENCOUNTER — Other Ambulatory Visit: Payer: Self-pay

## 2020-08-09 ENCOUNTER — Telehealth: Payer: Self-pay | Admitting: Pharmacist

## 2020-08-09 ENCOUNTER — Other Ambulatory Visit: Payer: Self-pay

## 2020-08-09 MED FILL — Torsemide Tab 20 MG: ORAL | 10 days supply | Qty: 30 | Fill #0 | Status: AC

## 2020-08-09 NOTE — Telephone Encounter (Signed)
08/09/2020 1:47:42 PM - Ozempic '1mg'$  renewal to [facility] - Wednesday, August 09, 2020 1:46 PM --[employer] renewal for [employer] 1 mg Inject 1 mg once a week # 4 boxes.

## 2020-08-10 ENCOUNTER — Other Ambulatory Visit: Payer: Self-pay

## 2020-08-21 ENCOUNTER — Telehealth: Payer: Self-pay | Admitting: Pharmacist

## 2020-08-21 ENCOUNTER — Other Ambulatory Visit: Payer: Self-pay

## 2020-08-21 MED FILL — Glucose Blood Test Strip: 30 days supply | Qty: 100 | Fill #0 | Status: AC

## 2020-08-21 NOTE — Telephone Encounter (Signed)
08/21/2020 9:24:16 AM - [name] refill called to [facility] to ship to [place]  -- Elmer Picker - Monday, August 21, 2020 9:23 AM --[place] spoke with Hinton D[facility] to place refill on Farxiga--they will ship to [place], allow 7-10 business days for [place] to receive.  Patient has 1 refill left.

## 2020-08-28 ENCOUNTER — Other Ambulatory Visit: Payer: Self-pay

## 2020-09-01 ENCOUNTER — Other Ambulatory Visit: Payer: Self-pay

## 2020-09-04 ENCOUNTER — Other Ambulatory Visit: Payer: Self-pay

## 2020-09-05 ENCOUNTER — Other Ambulatory Visit: Payer: Self-pay | Admitting: Family

## 2020-09-05 ENCOUNTER — Other Ambulatory Visit: Payer: Self-pay

## 2020-09-05 MED ORDER — DAPAGLIFLOZIN PROPANEDIOL 10 MG PO TABS
ORAL_TABLET | ORAL | 3 refills | Status: DC
  Filled 2020-09-05: qty 90, 90d supply, fill #0

## 2020-09-05 NOTE — Progress Notes (Signed)
Farxiga [facility]ill [facility]

## 2020-09-08 ENCOUNTER — Other Ambulatory Visit: Payer: Self-pay

## 2020-09-08 MED FILL — Torsemide Tab 20 MG: ORAL | 18 days supply | Qty: 54 | Fill #1 | Status: AC

## 2020-09-13 ENCOUNTER — Other Ambulatory Visit: Payer: Self-pay

## 2020-09-15 ENCOUNTER — Other Ambulatory Visit: Payer: Self-pay

## 2020-09-15 ENCOUNTER — Other Ambulatory Visit: Payer: Self-pay | Admitting: Gerontology

## 2020-09-15 DIAGNOSIS — J449 Chronic obstructive pulmonary disease, unspecified: Secondary | ICD-10-CM

## 2020-09-19 ENCOUNTER — Other Ambulatory Visit: Payer: Self-pay

## 2020-09-19 MED ORDER — ALBUTEROL SULFATE HFA 108 (90 BASE) MCG/ACT IN AERS
2.0000 | INHALATION_SPRAY | Freq: Four times a day (QID) | RESPIRATORY_TRACT | 2 refills | Status: DC | PRN
  Filled 2020-09-19: qty 17, 50d supply, fill #0
  Filled 2020-10-27: qty 17, 50d supply, fill #1

## 2020-09-28 ENCOUNTER — Other Ambulatory Visit: Payer: Self-pay | Admitting: Gerontology

## 2020-09-28 ENCOUNTER — Other Ambulatory Visit: Payer: Self-pay | Admitting: Family

## 2020-09-28 ENCOUNTER — Other Ambulatory Visit: Payer: Self-pay

## 2020-09-28 DIAGNOSIS — Z794 Long term (current) use of insulin: Secondary | ICD-10-CM

## 2020-09-28 DIAGNOSIS — K219 Gastro-esophageal reflux disease without esophagitis: Secondary | ICD-10-CM

## 2020-09-28 DIAGNOSIS — E1165 Type 2 diabetes mellitus with hyperglycemia: Secondary | ICD-10-CM

## 2020-09-28 MED ORDER — FAMOTIDINE 20 MG PO TABS
ORAL_TABLET | Freq: Two times a day (BID) | ORAL | 1 refills | Status: DC
  Filled 2020-09-28: qty 60, 30d supply, fill #0
  Filled 2020-11-02: qty 60, 30d supply, fill #1

## 2020-09-28 MED ORDER — SEMAGLUTIDE(0.25 OR 0.5MG/DOS) 2 MG/1.5ML ~~LOC~~ SOPN
0.5000 mg | PEN_INJECTOR | SUBCUTANEOUS | 2 refills | Status: DC
  Filled 2020-09-28: qty 3, 28d supply, fill #0
  Filled 2020-10-27: qty 1.5, 28d supply, fill #1
  Filled 2020-11-10: qty 1.5, 14d supply, fill #1

## 2020-09-28 MED ORDER — ASPIRIN 81 MG PO TBEC
DELAYED_RELEASE_TABLET | Freq: Every day | ORAL | 4 refills | Status: DC
  Filled 2020-09-28: qty 90, 90d supply, fill #0

## 2020-09-28 MED FILL — Cetirizine HCl Tab 10 MG: ORAL | 90 days supply | Qty: 90 | Fill #1 | Status: CN

## 2020-09-28 MED FILL — Torsemide Tab 20 MG: ORAL | 90 days supply | Qty: 270 | Fill #2 | Status: AC

## 2020-09-29 ENCOUNTER — Other Ambulatory Visit: Payer: Self-pay

## 2020-10-02 ENCOUNTER — Other Ambulatory Visit: Payer: Self-pay

## 2020-10-03 ENCOUNTER — Other Ambulatory Visit: Payer: Self-pay

## 2020-10-05 ENCOUNTER — Other Ambulatory Visit: Payer: Self-pay

## 2020-10-05 MED FILL — Cetirizine HCl Tab 10 MG: ORAL | 90 days supply | Qty: 90 | Fill #1 | Status: AC

## 2020-10-09 ENCOUNTER — Telehealth: Payer: Self-pay | Admitting: Pharmacist

## 2020-10-09 NOTE — Telephone Encounter (Signed)
--   Bradley Mckenzie - Monday, October 09, 2020 11:49 AM --Placed refill online order# [id number] with [facility] for Advair [id number].

## 2020-10-10 ENCOUNTER — Telehealth: Payer: Self-pay | Admitting: Pharmacist

## 2020-10-10 NOTE — Telephone Encounter (Signed)
--   Elmer Picker - Tuesday, October 10, 2020 9:45 AM --Virgel Gess [employer] renewal for Lantus Solostar Inject 25 units daily # 2 boxes ([place])

## 2020-10-18 ENCOUNTER — Other Ambulatory Visit: Payer: Self-pay | Admitting: Gerontology

## 2020-10-18 ENCOUNTER — Other Ambulatory Visit: Payer: Self-pay

## 2020-10-18 DIAGNOSIS — J449 Chronic obstructive pulmonary disease, unspecified: Secondary | ICD-10-CM

## 2020-10-18 DIAGNOSIS — J45901 Unspecified asthma with (acute) exacerbation: Secondary | ICD-10-CM

## 2020-10-18 MED FILL — Fluticasone-Salmeterol Aer Powder BA 500-50 MCG/ACT: RESPIRATORY_TRACT | Qty: 60 | Fill #0 | Status: CN

## 2020-10-19 ENCOUNTER — Other Ambulatory Visit: Payer: Self-pay

## 2020-10-25 ENCOUNTER — Other Ambulatory Visit: Payer: Self-pay

## 2020-10-26 ENCOUNTER — Other Ambulatory Visit: Payer: Self-pay | Admitting: Gerontology

## 2020-10-26 ENCOUNTER — Other Ambulatory Visit: Payer: Self-pay

## 2020-10-26 DIAGNOSIS — J45901 Unspecified asthma with (acute) exacerbation: Secondary | ICD-10-CM

## 2020-10-26 DIAGNOSIS — J449 Chronic obstructive pulmonary disease, unspecified: Secondary | ICD-10-CM

## 2020-10-26 MED FILL — Fluticasone-Salmeterol Aer Powder BA 500-50 MCG/ACT: RESPIRATORY_TRACT | Qty: 60 | Fill #0 | Status: CN

## 2020-10-27 ENCOUNTER — Other Ambulatory Visit: Payer: Self-pay

## 2020-10-30 ENCOUNTER — Other Ambulatory Visit: Payer: Self-pay

## 2020-11-01 ENCOUNTER — Other Ambulatory Visit: Payer: Self-pay

## 2020-11-01 DIAGNOSIS — E1165 Type 2 diabetes mellitus with hyperglycemia: Secondary | ICD-10-CM

## 2020-11-01 DIAGNOSIS — Z794 Long term (current) use of insulin: Secondary | ICD-10-CM

## 2020-11-01 DIAGNOSIS — I10 Essential (primary) hypertension: Secondary | ICD-10-CM

## 2020-11-02 ENCOUNTER — Other Ambulatory Visit: Payer: Self-pay

## 2020-11-02 LAB — HEMOGLOBIN A1C
Est. average glucose Bld gHb Est-mCnc: 186 mg/dL
Hgb A1c MFr Bld: 8.1 % — ABNORMAL HIGH (ref 4.8–5.6)

## 2020-11-02 LAB — BASIC METABOLIC PANEL
BUN/Creatinine Ratio: 7 — ABNORMAL LOW (ref 9–20)
BUN: 12 mg/dL (ref 6–24)
CO2: 24 mmol/L (ref 20–29)
Calcium: 9.5 mg/dL (ref 8.7–10.2)
Chloride: 100 mmol/L (ref 96–106)
Creatinine, Ser: 1.62 mg/dL — ABNORMAL HIGH (ref 0.76–1.27)
Glucose: 187 mg/dL — ABNORMAL HIGH (ref 70–99)
Potassium: 4 mmol/L (ref 3.5–5.2)
Sodium: 139 mmol/L (ref 134–144)
eGFR: 50 mL/min/{1.73_m2} — ABNORMAL LOW (ref >59)

## 2020-11-03 ENCOUNTER — Other Ambulatory Visit: Payer: Self-pay

## 2020-11-07 ENCOUNTER — Ambulatory Visit: Payer: Self-pay | Admitting: Family

## 2020-11-08 ENCOUNTER — Other Ambulatory Visit: Payer: Self-pay

## 2020-11-08 NOTE — Progress Notes (Signed)
Patient ID: Bradley Trickey., male    DOB: 18-Apr-1965, 55 y.o.   MRN: [id number]  HPI  Bradley Mckenzie is a 55 y/o male who has a history of diabetes, HTN, obstructive sleep apnea, depression, hyperlipidemia, morbid obesity and heart failure with a reduced ejection fraction. Remote tobacco exposure.   Echo report from 10/28/2018 reviewed and showed an EF of 60-65% along with mildly elevated PA pressure. Echo done 09/02/15 with an EF of 30-35%, mild mitral regurgitation and no aortic stenosis. EF has declined from 40-45% in March 2017 but he had been inconsistent taking his medications & not using his CPAP during this time.  Was in the ED 06/06/20 due to left knee & left arm pain where he was evaluated and released.     Returns today for a follow-up visit with a chief complaint of moderate shortness of breath with minimal exertion. He describes this as chronic in nature having been present for several years although he does feel like it has worsened over the last several weeks. He has associated fatigue, cough, abdominal distention, dizziness, headaches, wheezing, weakness and gradual weight gain along with this. He denies any difficulty sleeping, palpitations or pedal edema.   Says that he's having quite a bit of cramping in his legs and back at times. Reports the fatigue is so bad that his wife has to help him get dressed, bathed etc.   His biggest complaint is of intermittent, severe coughing that comes in waves. When he coughs, he gets dizzy, weak and feels shaky all over. Is concerned that if this continues, he is going to have to stop driving because he doesn't want to do anything that is unsafe for himself or others.   Saw his PCP earlier today and is going to be starting zpack and prednisone once [facility] gets it filled.   Past Surgical History:  Procedure Laterality Date   HERNIA REPAIR  2004   Family History  Problem Relation Age of Onset   Heart disease Mother         alive & well.   Heart attack Father 44       died @ 28 of cancer.   Diabetes Father    Cancer Father        "in his stomach"   Cancer Sister    Cancer Paternal Uncle        strong FH malignancy multiple relavies    Social History   Tobacco Use   Smoking status: Never   Smokeless tobacco: Never   Tobacco comments:    Quit in his 29's.  Substance Use Topics   Alcohol use: Not Currently    Alcohol/week: 0.0 standard drinks    Comment: occassional alcohol use at special events   Past Medical History:  Diagnosis Date   Asthma    Cardiomyopathy ([place]) 2016   a. 01/2014 Echo: EF 25-30%;  b. ? ischemic vs non-ischemic.  He's never had an ischemic eval.   Chronic combined systolic (congestive) and diastolic (congestive) heart failure ([place])    a. 01/2014 Echo: EF 25-30%. b. Echo 03/2015: Improved EF of 40-45%   COPD (chronic obstructive pulmonary disease) ([place])    Depression    Diabetes mellitus without complication ([place]) 4496   started med therapy approx 2018   Hyperlipidemia    Hypertensive cardiomyopathy ([place]) 06/30/2015   Hypertensive heart disease    a. Since his 37's.   Morbid obesity ([place])    Obstructive sleep apnea  a. Has not used CPAP since ~ 2012.   Past Surgical History:  Procedure Laterality Date   HERNIA REPAIR  2004   Family History  Problem Relation Age of Onset   Heart disease Mother        alive & well.   Heart attack Father 24       died @ 41 of cancer.   Diabetes Father    Cancer Father        "in his stomach"   Cancer Sister    Cancer Paternal Uncle        strong FH malignancy multiple relavies    Social History   Tobacco Use   Smoking status: Never   Smokeless tobacco: Never   Tobacco comments:    Quit in his 42's.  Substance Use Topics   Alcohol use: Not Currently    Alcohol/week: 0.0 standard drinks    Comment: occassional alcohol use at special events   No Known Allergies  Prior to Admission medications   Medication Sig Start Date  End Date Taking? Authorizing Provider  albuterol (VENTOLIN HFA) 108 (90 Base) MCG/ACT inhaler Inhale 2 puffs into the lungs every 6 (six) hours as needed for wheezing or shortness of breath. 11/09/20  Yes Regalado, Belkys A, MD  atorvastatin (LIPITOR) 10 MG tablet TAKE ONE TABLET BY MOUTH EVERY DAY 11/09/20 11/09/21 Yes Regalado, Belkys A, MD  Blood Glucose Monitoring Suppl (RIGHTEST GM550 BLOOD GLUCOSE) w/Device KIT AS DIRECTED 12/01/19 11/30/20 Yes   Blood Pressure Monitoring (BLOOD PRESSURE CUFF) MISC USE AS DIRECTED ONCE DAILY AT 12 NOON. 11/09/20  Yes Regalado, Belkys A, MD  carvedilol (COREG) 12.5 MG tablet Take 1 tablet (12.5 mg total) by mouth 2 (two) times daily with a meal. 11/09/20  Yes Regalado, Belkys A, MD  cetirizine (ZYRTEC) 10 MG tablet TAKE ONE TABLET BY MOUTH EVERY DAY 11/09/20 11/09/21 Yes Regalado, Belkys A, MD  dapagliflozin propanediol (FARXIGA) 10 MG TABS tablet TAKE ONE TABLET BY MOUTH EVERY DAY BEFORE BREAKFAST 11/09/20 11/09/21 Yes Regalado, Belkys A, MD  fluticasone-salmeterol (ADVAIR DISKUS) 500-50 MCG/ACT AEPB Inhale 1 puff into the lungs in the morning and at bedtime. 11/09/20  Yes Regalado, Belkys A, MD  glucose blood test strip USE AS DIRECTED 12/01/19 11/30/20 Yes   hydrALAZINE (APRESOLINE) 25 MG tablet TAKE ONE TABLET BY MOUTH 2 TIMES A DAY 11/09/20 07/24/21 Yes Regalado, Belkys A, MD  insulin glargine (LANTUS) 100 UNIT/ML Solostar Pen Inject 28 Units into the skin once daily. 11/09/20 08/04/21 Yes Regalado, Belkys A, MD  Insulin Pen Needle 32G X 4 MM MISC Use as directed 06/20/20  Yes Aline Brochure, Keri K, RPH  ipratropium-albuterol (DUONEB) 0.5-2.5 (3) MG/3ML SOLN INHALE CONTENTS OF ONE VIAL (3ML) USING NEBULIZER ONCE EVERY 4 HOURS AS NEEDED 11/09/20 11/09/21 Yes Regalado, Belkys A, MD  losartan (COZAAR) 100 MG tablet TAKE ONE TABLET BY MOUTH EVERY DAY 11/09/20 11/09/21 Yes Regalado, Belkys A, MD  metFORMIN (GLUCOPHAGE) 500 MG tablet TAKE ONE TABLET BY MOUTH 2 TIMES A DAY  WITH A MEAL. 11/09/20 11/09/21 Yes Regalado, Belkys A, MD  montelukast (SINGULAIR) 10 MG tablet TAKE ONE TABLET BY MOUTH AT BEDTIME 11/09/20 11/09/21 Yes Regalado, Belkys A, MD  pantoprazole (PROTONIX) 40 MG tablet Take 1 tablet (40 mg total) by mouth 2 (two) times daily. 11/09/20  Yes Regalado, Belkys A, MD  potassium chloride SA (KLOR-CON) 20 MEQ tablet TAKE ONE TABLET BY MOUTH 2 TIMES A DAY 11/09/20 11/09/21 Yes Regalado, Cassie Freer, MD  Rightest GL300 Lancets  MISC AS DIRECTED 12/01/19 11/30/20 Yes   Semaglutide, 1 MG/DOSE, (OZEMPIC, 1 MG/DOSE,) 2 MG/1.5ML SOPN Inject 1 mg into the skin once a week. 07/26/20 01/22/21 Yes Iloabachie, Chioma E, NP  torsemide (DEMADEX) 20 MG tablet Take 2 tablets (40 mg total) by mouth once daily. May take additional 1 tablet (40m total) in the evening if needed. 07/26/20  Yes Tekelia Kareem, TOtila KluverA, FNP  azithromycin (ZITHROMAX) 250 MG tablet Take 2 tablets (5067m by mouth once daily on Day 1. Then take 1 tablet (250 mg total) by mouth once daily thereafter. 11/09/20 11/14/20  Regalado, BeCassie FreerMD   Review of Systems  Constitutional:  Positive for fatigue. Negative for appetite change.  HENT:  Positive for congestion and postnasal drip. Negative for sore throat.   Eyes: Negative.   Respiratory:  Positive for cough ("harsh at times"), shortness of breath (with little exertion) and wheezing. Negative for chest tightness.   Cardiovascular:  Negative for chest pain, palpitations and leg swelling.  Gastrointestinal:  Positive for abdominal distention. Negative for abdominal pain.  Endocrine: Negative.   Genitourinary: Negative.   Musculoskeletal:  Negative for back pain and neck pain.  Skin: Negative.   Allergic/Immunologic: Negative.   Neurological:  Positive for dizziness, weakness ("when coughing"), light-headedness and headaches (when coughing hard).  Hematological:  Negative for adenopathy. Does not bruise/bleed easily.  Psychiatric/Behavioral:  Negative for dysphoric  mood, sleep disturbance and suicidal ideas. The patient is not nervous/anxious.    Vitals:   11/09/20 1247  BP: 135/82  Pulse: (!) 104  Resp: 18  SpO2: 96%  Weight: 280 lb 4 oz (127.1 kg)  Height: 5' 9"  (1.753 m)   Wt Readings from Last 3 Encounters:  11/09/20 280 lb 4 oz (127.1 kg)  11/09/20 286 lb 8 oz (130 kg)  11/01/20 289 lb 11.2 oz (131.4 kg)   Lab Results  Component Value Date   CREATININE 1.62 (H) 11/01/2020   CREATININE 1.53 (H) 06/15/2020   CREATININE 1.70 (H) 06/06/2020   Physical Exam Vitals and nursing note reviewed.  Constitutional:      Appearance: He is well-developed.  HENT:     Head: Normocephalic and atraumatic.  Neck:     Vascular: No JVD.  Cardiovascular:     Rate and Rhythm: Normal rate and regular rhythm.  Pulmonary:     Effort: Pulmonary effort is normal.     Breath sounds: Wheezing (expiratory throughout all lung fields) present. No rales.  Abdominal:     General: There is no distension.     Palpations: Abdomen is soft.     Tenderness: There is no abdominal tenderness.  Musculoskeletal:        General: No tenderness.     Cervical back: Neck supple.     Left knee: Normal.     Right lower leg: No edema.     Left lower leg: No edema.  Skin:    General: Skin is warm and dry.  Neurological:     Mental Status: He is alert and oriented to person, place, and time.  Psychiatric:        Behavior: Behavior normal.   Assessment & Plan:  1: Chronic heart failure with now preserved ejection fraction-  - NYHA Class II - euvolemic today - weighing daily and he was reminded to call for an overnight weight gain of >2 pounds or a weekly weight gain of >5 pounds - weight down 4 pounds since last here 5 months ago - trying to eat  low sodium foods when he can but says it's difficult at times due to finances - PCP note says that an echo will be ordered - due to cramping will decrease torsemide to 64m QOD/ 276mQOD and see if that helps. If he notes  weight gain or swelling, he needs to resume taking torsemide 4044mID  2: HTN- - BP looks good today - went to [facility] earlier today; started prednisone and zpack - BMP on 11/01/20 reviewed and shows sodium 139, potassium 4.0, creatinine 1.62 and GFR 50  3: Diabetes- - fasting glucose at home this morning was 115 - A1c on 11/01/20 was 8.1%   Patient did not bring his medications nor a list. Each medication was verbally reviewed with the patient and he was encouraged to bring the bottles to every visit to confirm accuracy of list.  Return in 3 months or sooner for any questions/problems before then.

## 2020-11-09 ENCOUNTER — Encounter: Payer: Self-pay | Admitting: Family

## 2020-11-09 ENCOUNTER — Other Ambulatory Visit: Payer: Self-pay

## 2020-11-09 ENCOUNTER — Encounter: Payer: Self-pay | Admitting: Gerontology

## 2020-11-09 ENCOUNTER — Ambulatory Visit: Payer: Self-pay | Admitting: Internal Medicine

## 2020-11-09 ENCOUNTER — Ambulatory Visit: Payer: Self-pay | Attending: Family | Admitting: Family

## 2020-11-09 VITALS — BP 123/82 | HR 96 | Temp 97.6°F | Resp 16 | Ht 69.0 in | Wt 286.5 lb

## 2020-11-09 VITALS — BP 135/82 | HR 104 | Resp 18 | Ht 69.0 in | Wt 280.2 lb

## 2020-11-09 DIAGNOSIS — I120 Hypertensive chronic kidney disease with stage 5 chronic kidney disease or end stage renal disease: Secondary | ICD-10-CM

## 2020-11-09 DIAGNOSIS — R252 Cramp and spasm: Secondary | ICD-10-CM | POA: Insufficient documentation

## 2020-11-09 DIAGNOSIS — Z7951 Long term (current) use of inhaled steroids: Secondary | ICD-10-CM | POA: Insufficient documentation

## 2020-11-09 DIAGNOSIS — I10 Essential (primary) hypertension: Secondary | ICD-10-CM

## 2020-11-09 DIAGNOSIS — J4521 Mild intermittent asthma with (acute) exacerbation: Secondary | ICD-10-CM

## 2020-11-09 DIAGNOSIS — Z8249 Family history of ischemic heart disease and other diseases of the circulatory system: Secondary | ICD-10-CM | POA: Insufficient documentation

## 2020-11-09 DIAGNOSIS — I5032 Chronic diastolic (congestive) heart failure: Secondary | ICD-10-CM

## 2020-11-09 DIAGNOSIS — R635 Abnormal weight gain: Secondary | ICD-10-CM

## 2020-11-09 DIAGNOSIS — E119 Type 2 diabetes mellitus without complications: Secondary | ICD-10-CM | POA: Insufficient documentation

## 2020-11-09 DIAGNOSIS — Z794 Long term (current) use of insulin: Secondary | ICD-10-CM | POA: Insufficient documentation

## 2020-11-09 DIAGNOSIS — R519 Headache, unspecified: Secondary | ICD-10-CM | POA: Insufficient documentation

## 2020-11-09 DIAGNOSIS — Z7984 Long term (current) use of oral hypoglycemic drugs: Secondary | ICD-10-CM | POA: Insufficient documentation

## 2020-11-09 DIAGNOSIS — E785 Hyperlipidemia, unspecified: Secondary | ICD-10-CM | POA: Insufficient documentation

## 2020-11-09 DIAGNOSIS — I429 Cardiomyopathy, unspecified: Secondary | ICD-10-CM | POA: Insufficient documentation

## 2020-11-09 DIAGNOSIS — R42 Dizziness and giddiness: Secondary | ICD-10-CM | POA: Insufficient documentation

## 2020-11-09 DIAGNOSIS — J449 Chronic obstructive pulmonary disease, unspecified: Secondary | ICD-10-CM | POA: Insufficient documentation

## 2020-11-09 DIAGNOSIS — Z833 Family history of diabetes mellitus: Secondary | ICD-10-CM | POA: Insufficient documentation

## 2020-11-09 DIAGNOSIS — I110 Hypertensive heart disease with heart failure: Secondary | ICD-10-CM | POA: Insufficient documentation

## 2020-11-09 DIAGNOSIS — R531 Weakness: Secondary | ICD-10-CM | POA: Insufficient documentation

## 2020-11-09 DIAGNOSIS — J301 Allergic rhinitis due to pollen: Secondary | ICD-10-CM

## 2020-11-09 DIAGNOSIS — I5042 Chronic combined systolic (congestive) and diastolic (congestive) heart failure: Secondary | ICD-10-CM | POA: Insufficient documentation

## 2020-11-09 DIAGNOSIS — K921 Melena: Secondary | ICD-10-CM

## 2020-11-09 DIAGNOSIS — G4733 Obstructive sleep apnea (adult) (pediatric): Secondary | ICD-10-CM | POA: Insufficient documentation

## 2020-11-09 DIAGNOSIS — Z79899 Other long term (current) drug therapy: Secondary | ICD-10-CM | POA: Insufficient documentation

## 2020-11-09 DIAGNOSIS — E1165 Type 2 diabetes mellitus with hyperglycemia: Secondary | ICD-10-CM

## 2020-11-09 DIAGNOSIS — Z87891 Personal history of nicotine dependence: Secondary | ICD-10-CM | POA: Insufficient documentation

## 2020-11-09 DIAGNOSIS — J45901 Unspecified asthma with (acute) exacerbation: Secondary | ICD-10-CM

## 2020-11-09 MED ORDER — ALBUTEROL SULFATE HFA 108 (90 BASE) MCG/ACT IN AERS
2.0000 | INHALATION_SPRAY | Freq: Four times a day (QID) | RESPIRATORY_TRACT | 2 refills | Status: DC | PRN
  Filled 2020-11-09: qty 6.7, 20d supply, fill #0

## 2020-11-09 MED ORDER — IPRATROPIUM-ALBUTEROL 0.5-2.5 (3) MG/3ML IN SOLN
RESPIRATORY_TRACT | 1 refills | Status: DC
  Filled 2020-11-09: qty 180, 10d supply, fill #0

## 2020-11-09 MED ORDER — LOSARTAN POTASSIUM 100 MG PO TABS
ORAL_TABLET | Freq: Every day | ORAL | 4 refills | Status: DC
  Filled 2020-11-09: qty 90, fill #0
  Filled 2021-01-09: qty 90, 90d supply, fill #0
  Filled 2021-03-23 – 2021-03-27 (×2): qty 90, 90d supply, fill #1
  Filled 2021-08-05: qty 90, 90d supply, fill #2
  Filled 2021-08-06: qty 90, 90d supply, fill #0

## 2020-11-09 MED ORDER — DAPAGLIFLOZIN PROPANEDIOL 10 MG PO TABS
ORAL_TABLET | ORAL | 3 refills | Status: DC
  Filled 2020-11-09 – 2021-01-29 (×2): qty 90, fill #0

## 2020-11-09 MED ORDER — HYDRALAZINE HCL 25 MG PO TABS
ORAL_TABLET | Freq: Two times a day (BID) | ORAL | 1 refills | Status: DC
  Filled 2020-11-09: qty 180, fill #0
  Filled 2021-01-09: qty 180, 90d supply, fill #0
  Filled 2021-03-23 – 2021-03-27 (×2): qty 180, 90d supply, fill #1

## 2020-11-09 MED ORDER — BLOOD PRESSURE CUFF MISC
1.0000 "application " | Freq: Every day | 0 refills | Status: AC
  Filled 2020-11-09: qty 1, fill #0

## 2020-11-09 MED ORDER — CARVEDILOL 12.5 MG PO TABS
12.5000 mg | ORAL_TABLET | Freq: Two times a day (BID) | ORAL | 3 refills | Status: DC
  Filled 2020-11-09 – 2021-01-09 (×2): qty 180, 90d supply, fill #0
  Filled 2021-03-23 – 2021-03-27 (×2): qty 180, 90d supply, fill #1
  Filled 2021-08-05: qty 180, 90d supply, fill #2
  Filled 2021-08-06: qty 180, 90d supply, fill #0

## 2020-11-09 MED ORDER — PANTOPRAZOLE SODIUM 40 MG PO TBEC
40.0000 mg | DELAYED_RELEASE_TABLET | Freq: Two times a day (BID) | ORAL | 0 refills | Status: DC
  Filled 2020-11-09: qty 60, 30d supply, fill #0

## 2020-11-09 MED ORDER — GUAIFENESIN ER 600 MG PO TB12: 600.0000 mg | ORAL_TABLET | Freq: Two times a day (BID) | ORAL | Status: AC

## 2020-11-09 MED ORDER — CETIRIZINE HCL 10 MG PO TABS
ORAL_TABLET | Freq: Every day | ORAL | 1 refills | Status: DC
  Filled 2020-11-09: qty 90, fill #0
  Filled 2021-01-09: qty 90, 90d supply, fill #0
  Filled 2021-03-23 – 2021-03-27 (×2): qty 90, 90d supply, fill #1

## 2020-11-09 MED ORDER — INSULIN GLARGINE 100 UNIT/ML SOLOSTAR PEN
28.0000 [IU] | PEN_INJECTOR | Freq: Every day | SUBCUTANEOUS | 4 refills | Status: DC
Start: 2020-11-09 — End: 2021-02-21
  Filled 2020-11-09: qty 15, 53d supply, fill #0
  Filled 2020-12-12: qty 30, 107d supply, fill #0

## 2020-11-09 MED ORDER — METFORMIN HCL 500 MG PO TABS
ORAL_TABLET | Freq: Two times a day (BID) | ORAL | 1 refills | Status: DC
Start: 2020-11-09 — End: 2021-08-07
  Filled 2020-11-09: qty 180, fill #0
  Filled 2021-01-09: qty 180, 90d supply, fill #0
  Filled 2021-03-23 – 2021-03-27 (×2): qty 180, 90d supply, fill #1

## 2020-11-09 MED ORDER — PREDNISONE 20 MG PO TABS: 30.0000 mg | ORAL_TABLET | Freq: Every day | ORAL | Status: AC

## 2020-11-09 MED ORDER — ATORVASTATIN CALCIUM 10 MG PO TABS
ORAL_TABLET | Freq: Every day | ORAL | 4 refills | Status: DC
  Filled 2020-11-09: qty 90, fill #0
  Filled 2020-12-26: qty 14, 14d supply, fill #0
  Filled 2021-01-09: qty 90, 90d supply, fill #1
  Filled 2021-03-23 – 2021-03-27 (×2): qty 90, 90d supply, fill #2
  Filled 2021-08-05: qty 90, 90d supply, fill #3
  Filled 2021-08-06: qty 90, 90d supply, fill #0

## 2020-11-09 MED ORDER — POTASSIUM CHLORIDE CRYS ER 20 MEQ PO TBCR
EXTENDED_RELEASE_TABLET | Freq: Two times a day (BID) | ORAL | 4 refills | Status: DC
  Filled 2020-11-09: qty 180, fill #0
  Filled 2021-01-09: qty 180, 90d supply, fill #0
  Filled 2021-03-23 – 2021-03-27 (×2): qty 180, 90d supply, fill #1
  Filled 2021-08-05: qty 180, 90d supply, fill #2
  Filled 2021-08-06: qty 180, 90d supply, fill #0

## 2020-11-09 MED ORDER — VITAMIN D 25 MCG (1000 UNIT) PO TABS: 1000.0000 [IU] | ORAL_TABLET | Freq: Every day | ORAL | Status: DC

## 2020-11-09 MED ORDER — FLUTICASONE-SALMETEROL 500-50 MCG/ACT IN AEPB
INHALATION_SPRAY | RESPIRATORY_TRACT | 3 refills | Status: DC
  Filled 2020-11-09: qty 60, fill #0
  Filled 2021-02-08: qty 180, 90d supply, fill #0

## 2020-11-09 MED ORDER — MONTELUKAST SODIUM 10 MG PO TABS
ORAL_TABLET | Freq: Every day | ORAL | 3 refills | Status: DC
Start: 2020-11-09 — End: 2021-07-10
  Filled 2020-11-09: qty 30, fill #0
  Filled 2021-01-09: qty 90, 90d supply, fill #0
  Filled 2021-03-23: qty 90, 90d supply, fill #1
  Filled 2021-03-27: qty 30, 30d supply, fill #1

## 2020-11-09 MED ORDER — AZITHROMYCIN 250 MG PO TABS
250.0000 mg | ORAL_TABLET | Freq: Every day | ORAL | 0 refills | Status: AC
  Filled 2020-11-09: qty 6, 5d supply, fill #0

## 2020-11-09 NOTE — Patient Instructions (Addendum)
Continue weighing daily and call for an overnight weight gain of > 2 pounds or a weekly weight gain of >5 pounds.    Decrease torsemide to 2 tablets alternating with 1 tablet every other day

## 2020-11-09 NOTE — Progress Notes (Signed)
Established Patient Office Visit  Subjective:  Patient ID: Bradley Mckenzie., male    DOB: Oct 16, 1965  Age: 55 y.o. MRN: [id number]  CC: presents for follow up of labs, report Persistent intermittent cough, SOB.    HPI Jsoeph Winn Mckenzie. presents for follow up of labs results. He has PMH significant for Diastolic HF, improved Systolic HF last echo Ef 60 %, HTN, DM type 2, CKD who presents complaining of persistent cough, Dyspnea, Wheezing. He has been using more frequently his nebulizer with some improvement. Last time when he received prednisone help some. He is expose to smoke. He does not smoke. He report increase hunger for last week, and weight gain. He does not have LE edema. He report some confusion while he had coughing spell.   He is report epigastric discomfort, reflux.  He report 2 episode of bloody stool. He couldn't specify amount, second episode notice tissue paper. Episode happened last week. No recent episode.      Past Medical History:  Diagnosis Date   Asthma    Cardiomyopathy ([place]) 2016   a. 01/2014 Echo: EF 25-30%;  b. ? ischemic vs non-ischemic.  He's never had an ischemic eval.   Chronic combined systolic (congestive) and diastolic (congestive) heart failure ([place])    a. 01/2014 Echo: EF 25-30%. b. Echo 03/2015: Improved EF of 40-45%   COPD (chronic obstructive pulmonary disease) ([place])    Depression    Diabetes mellitus without complication ([place]) 7096   started med therapy approx 2018   Hyperlipidemia    Hypertensive cardiomyopathy ([place]) 06/30/2015   Hypertensive heart disease    a. Since his 89's.   Morbid obesity ([place])    Obstructive sleep apnea    a. Has not used CPAP since ~ 2012.    Past Surgical History:  Procedure Laterality Date   HERNIA REPAIR  2004    Family History  Problem Relation Age of Onset   Heart disease Mother        alive & well.   Heart attack Father 54       died @ 87 of cancer.   Diabetes Father    Cancer Father        "in  his stomach"   Cancer Sister    Cancer Paternal Uncle        strong FH malignancy multiple relavies     Social History   Socioeconomic History   Marital status: Married    Spouse name: Not on file   Number of children: 2   Years of education: 12   Highest education level: GED or equivalent  Occupational History   Occupation: unemployed    Comment: hard time getting disability, denied medicaid   Tobacco Use   Smoking status: Never   Smokeless tobacco: Never   Tobacco comments:    Quit in his 74's.  Vaping Use   Vaping Use: Never used  Substance and Sexual Activity   Alcohol use: Yes    Comment: rarely alcohol use at special events   Drug use: Not Currently    Comment: Used marijuana in his teens.   Sexual activity: Not Currently  Other Topics Concern   Not on file  Social History Narrative   Lives in [place] with his wife. Wife no longer working; now on disability. Have stable housing at group home that his wife used to work at. Sometimes struggles with transportation but not desperate. Wife has working car, his doesn't work. Had food stamps but cut  off after 3 months. Wasn't eligible after that bc not able to work. Has been denied twice from disability. He was not able to obtain medicaid   Social Determinants of Health   Financial Resource Strain: Low Risk    Difficulty of Paying Living Expenses: Not hard at all  Food Insecurity: No Food Insecurity   Worried About Charity fundraiser in the Last Year: Never true   Arboriculturist in the Last Year: Never true  Transportation Needs: No Transportation Needs   Lack of Transportation (Medical): No   Lack of Transportation (Non-Medical): No  Physical Activity: Insufficiently Active   Days of Exercise per Week: 3 days   Minutes of Exercise per Session: 20 min  Stress: No Stress Concern Present   Feeling of Stress : Not at all  Social Connections: Moderately Isolated   Frequency of Communication with Friends and Family:  Once a week   Frequency of Social Gatherings with Friends and Family: Once a week   Attends Religious Services: 1 to 4 times per year   Active Member of [employer] or Organizations: No   Attends Music therapist: Never   Marital Status: Married  Human resources officer Violence: Not At Risk   Fear of Current or Ex-Partner: No   Emotionally Abused: No   Physically Abused: No   Sexually Abused: No    Outpatient Medications Prior to Visit  Medication Sig Dispense Refill   Blood Glucose Monitoring Suppl (RIGHTEST GM550 BLOOD GLUCOSE) w/Device KIT AS DIRECTED 1 kit 0   glucose blood test strip USE AS DIRECTED 100 strip 99   Insulin Pen Needle 32G X 4 MM MISC Use as directed 100 each 11   Rightest GL300 Lancets MISC AS DIRECTED 100 each 99   torsemide (DEMADEX) 20 MG tablet Take 2 tablets (40 mg total) by mouth once daily. May take additional 1 tablet (42m total) in the evening if needed. 270 tablet 3   albuterol (VENTOLIN HFA) 108 (90 Base) MCG/ACT inhaler Inhale 2 puffs into the lungs every 6 (six) hours as needed for wheezing or shortness of breath. 6.7 g 2   aspirin (ASPIRIN ADULT LOW STRENGTH) 81 MG EC tablet TAKE ONE TABLET BY MOUTH EVERY DAY 90 tablet 4   atorvastatin (LIPITOR) 10 MG tablet TAKE ONE TABLET BY MOUTH EVERY DAY 90 tablet 4   carvedilol (COREG) 12.5 MG tablet Take 1 tablet (12.5 mg total) by mouth 2 (two) times daily with a meal. 180 tablet 3   cetirizine (ZYRTEC) 10 MG tablet TAKE ONE TABLET BY MOUTH EVERY DAY 90 tablet 1   dapagliflozin propanediol (FARXIGA) 10 MG TABS tablet TAKE ONE TABLET BY MOUTH EVERY DAY BEFORE BREAKFAST 90 tablet 3   famotidine (PEPCID) 20 MG tablet TAKE ONE TABLET BY MOUTH TWICE DAILY. 60 tablet 1   fluticasone-salmeterol (ADVAIR DISKUS) 500-50 MCG/ACT AEPB Inhale 1 puff into the lungs in the morning and at bedtime. 60 each 3   hydrALAZINE (APRESOLINE) 25 MG tablet TAKE ONE TABLET BY MOUTH 2 TIMES A DAY 180 tablet 1   insulin glargine (LANTUS)  100 UNIT/ML Solostar Pen INJECT 25 UNITS INTO THE SKIN ONCE EVERY DAY. 15 mL 4   ipratropium-albuterol (DUONEB) 0.5-2.5 (3) MG/3ML SOLN INHALE CONTENTS OF ONE VIAL (3ML) USING NEBULIZER ONCE EVERY 4 HOURS AS NEEDED 180 mL 1   losartan (COZAAR) 100 MG tablet TAKE ONE TABLET BY MOUTH EVERY DAY 90 tablet 4   metFORMIN (GLUCOPHAGE) 500 MG tablet TAKE ONE  TABLET BY MOUTH 2 TIMES A DAY WITH A MEAL. 180 tablet 1   montelukast (SINGULAIR) 10 MG tablet TAKE ONE TABLET BY MOUTH AT BEDTIME 30 tablet 3   potassium chloride SA (KLOR-CON) 20 MEQ tablet TAKE ONE TABLET BY MOUTH 2 TIMES A DAY 180 tablet 4   Semaglutide,0.25 or 0.5MG/DOS, 2 MG/1.5ML SOPN Inject 1 mg total into the skin once a week. (0.7m dose x 2) 1.5 mL 2   Semaglutide, 1 MG/DOSE, (OZEMPIC, 1 MG/DOSE,) 2 MG/1.5ML SOPN Inject 1 mg into the skin once a week. 9 mL 3   benzonatate (TESSALON PERLES) 100 MG capsule Take 1 capsule (100 mg total) by mouth 3 (three) times daily as needed for cough. 20 capsule 0   predniSONE (DELTASONE) 10 MG tablet Take 1 tablet (10 mg total) by mouth daily with breakfast. 5 tablet 0   Vitamin D, Ergocalciferol, (DRISDOL) 1.25 MG (50000 UNIT) CAPS capsule Take 1 capsule (50,000 Units total) by mouth every 7 (seven) days. (Patient not taking: Reported on 11/09/2020) 8 capsule 0   No facility-administered medications prior to visit.    No Known Allergies  ROS Review of Systems  Constitutional:  Positive for appetite change and unexpected weight change.  Respiratory:  Positive for cough, shortness of breath and wheezing.   Allergic/Immunologic: Positive for environmental allergies.     Objective:    Physical Exam Constitutional:      Appearance: He is obese.  HENT:     Head: Normocephalic.     Mouth/Throat:     Pharynx: Oropharynx is clear.  Cardiovascular:     Rate and Rhythm: Normal rate.     Pulses: Normal pulses.  Pulmonary:     Effort: Pulmonary effort is normal.     Breath sounds: Wheezing and  rhonchi present.  Abdominal:     General: Bowel sounds are normal. There is no distension.     Tenderness: There is no abdominal tenderness. There is no guarding.  Neurological:     Mental Status: He is alert.    BP 123/82 (BP Location: Right Arm, Patient Position: Sitting, Cuff Size: Large)   Pulse 96   Temp 97.6 F (36.4 C) (Oral)   Resp 16   Ht _0  (1.753 m)   Wt 286 lb 8 oz (130 kg)   SpO2 95%   BMI 42.31 kg/m  Wt Readings from Last 3 Encounters:  11/09/20 286 lb 8 oz (130 kg)  11/01/20 289 lb 11.2 oz (131.4 kg)  07/26/20 283 lb (128.4 kg)     Health Maintenance Due  Topic Date Due   COVID-19 Vaccine (1) Never done   Pneumococcal Vaccine [age]Years old (1 - PCV) Never done   OPHTHALMOLOGY EXAM  Never done   HIV Screening  Never done   Hepatitis C Screening  Never done   TETANUS/TDAP  Never done   Zoster Vaccines- Shingrix (1 of 2) Never done   COLONOSCOPY (Pts 45-491yrInsurance coverage will need to be confirmed)  Never done   INFLUENZA VACCINE  Never done    There are no preventive care reminders to display for this patient.  Lab Results  Component Value Date   TSH 0.817 09/30/2019   Lab Results  Component Value Date   WBC 10.9 (H) 06/06/2020   HGB 13.9 06/06/2020   HCT 45.7 06/06/2020   MCV 86.7 06/06/2020   PLT 253 06/06/2020   Lab Results  Component Value Date   NA 139 11/01/2020   K  4.0 11/01/2020   CO2 24 11/01/2020   GLUCOSE 187 (H) 11/01/2020   BUN 12 11/01/2020   CREATININE 1.62 (H) 11/01/2020   BILITOT <0.2 02/24/2020   ALKPHOS 104 02/24/2020   AST 18 02/24/2020   ALT 28 02/24/2020   PROT 8.5 02/24/2020   ALBUMIN 4.7 02/24/2020   CALCIUM 9.5 11/01/2020   ANIONGAP 10 06/06/2020   EGFR 50 (L) 11/01/2020   Lab Results  Component Value Date   CHOL 167 09/30/2019   Lab Results  Component Value Date   HDL 44 09/30/2019   Lab Results  Component Value Date   LDLCALC 88 09/30/2019   Lab Results  Component Value Date   TRIG  210 (H) 09/30/2019   Lab Results  Component Value Date   CHOLHDL 3.8 09/30/2019   Lab Results  Component Value Date   HGBA1C 8.1 (H) 11/01/2020      Assessment & Plan:   1-CKD:  Cr mildly increase to 1.6.  Monitor closely on metformin.  Plan to repeat labs 1 week.     -Blood In the stool;  He report 2 episode of blood in the stool, last week.  No episode currently.  Plan to check CBC>  Referral made to GI.  Advised, if episode reoccurs, he will need to seek medical attention.  Report Epigastric pain and reflux. Started him on PPI.   Asthma; intermittent persistent:  He report SOB, Cough, Wheezing on and off for some time now.  Using inhaler more frequently.  Plan to start short course of prednisone/Azithromycin.  Continue with inhaler.  If no improvement, will need referral to pulmonologist.  Next  appointment next week.  Will treat for GERD as well.   Diastolic HF, Cardiomyopathy.  Continue with torsemide.  Last ECHO 2020. Will update ECHO.  Continue with Torsemide.   Diabetes Type 2:  Plan to increase lantus to 28 units.  Continue with Ozempic, metformin, Farxiga.          Problem List Items Addressed This Visit     Essential hypertension (Chronic)   Relevant Medications   atorvastatin (LIPITOR) 10 MG tablet   carvedilol (COREG) 12.5 MG tablet   hydrALAZINE (APRESOLINE) 25 MG tablet   losartan (COZAAR) 100 MG tablet   COPD (chronic obstructive pulmonary disease) (HCC)   Relevant Medications   predniSONE (DELTASONE) tablet 30 mg   guaiFENesin (MUCINEX) 12 hr tablet 600 mg   albuterol (VENTOLIN HFA) 108 (90 Base) MCG/ACT inhaler   cetirizine (ZYRTEC) 10 MG tablet   fluticasone-salmeterol (ADVAIR DISKUS) 500-50 MCG/ACT AEPB   ipratropium-albuterol (DUONEB) 0.5-2.5 (3) MG/3ML SOLN   montelukast (SINGULAIR) 10 MG tablet   azithromycin (ZITHROMAX) 250 MG tablet   Other Visit Diagnoses     Hypertensive kidney disease with CKD (chronic kidney  disease) stage V ([place])    -  Primary   Relevant Orders   Basic Metabolic Panel (BMET)   ECHO COMPLETE (BACK OFFICE)   Bloody stool       Relevant Orders   CBC   Ambulatory referral to Gastroenterology   Weight gain       Relevant Orders   TSH   Mild intermittent asthma with exacerbation       Relevant Medications   predniSONE (DELTASONE) tablet 30 mg   guaiFENesin (MUCINEX) 12 hr tablet 600 mg   cholecalciferol (VITAMIN D3) tablet 1,000 Units   albuterol (VENTOLIN HFA) 108 (90 Base) MCG/ACT inhaler   fluticasone-salmeterol (ADVAIR DISKUS) 500-50 MCG/ACT AEPB   ipratropium-albuterol (  DUONEB) 0.5-2.5 (3) MG/3ML SOLN   montelukast (SINGULAIR) 10 MG tablet   Cardiomyopathy, unspecified type (HCC)       Relevant Medications   atorvastatin (LIPITOR) 10 MG tablet   carvedilol (COREG) 12.5 MG tablet   hydrALAZINE (APRESOLINE) 25 MG tablet   losartan (COZAAR) 100 MG tablet   Moderate asthma with exacerbation, unspecified whether persistent       Relevant Medications   predniSONE (DELTASONE) tablet 30 mg   albuterol (VENTOLIN HFA) 108 (90 Base) MCG/ACT inhaler   fluticasone-salmeterol (ADVAIR DISKUS) 500-50 MCG/ACT AEPB   ipratropium-albuterol (DUONEB) 0.5-2.5 (3) MG/3ML SOLN   montelukast (SINGULAIR) 10 MG tablet   Type 2 diabetes mellitus with hyperglycemia, with long-term current use of insulin (HCC)       Relevant Medications   atorvastatin (LIPITOR) 10 MG tablet   dapagliflozin propanediol (FARXIGA) 10 MG TABS tablet   insulin glargine (LANTUS) 100 UNIT/ML Solostar Pen   losartan (COZAAR) 100 MG tablet   metFORMIN (GLUCOPHAGE) 500 MG tablet   Seasonal allergic rhinitis due to pollen       Relevant Medications   montelukast (SINGULAIR) 10 MG tablet       Meds ordered this encounter  Medications   predniSONE (DELTASONE) tablet 30 mg   guaiFENesin (MUCINEX) 12 hr tablet 600 mg   DISCONTD: pantoprazole (PROTONIX) 40 MG tablet    Sig: Take 1 tablet (40 mg total) by mouth 2  (two) times daily.    Dispense:  60 tablet    Refill:  0   cholecalciferol (VITAMIN D3) tablet 1,000 Units   albuterol (VENTOLIN HFA) 108 (90 Base) MCG/ACT inhaler    Sig: Inhale 2 puffs into the lungs every 6 (six) hours as needed for wheezing or shortness of breath.    Dispense:  6.7 g    Refill:  2   atorvastatin (LIPITOR) 10 MG tablet    Sig: TAKE ONE TABLET BY MOUTH EVERY DAY    Dispense:  90 tablet    Refill:  4   carvedilol (COREG) 12.5 MG tablet    Sig: Take 1 tablet (12.5 mg total) by mouth 2 (two) times daily with a meal.    Dispense:  180 tablet    Refill:  3   cetirizine (ZYRTEC) 10 MG tablet    Sig: TAKE ONE TABLET BY MOUTH EVERY DAY    Dispense:  90 tablet    Refill:  1   dapagliflozin propanediol (FARXIGA) 10 MG TABS tablet    Sig: TAKE ONE TABLET BY MOUTH EVERY DAY BEFORE BREAKFAST    Dispense:  90 tablet    Refill:  3   fluticasone-salmeterol (ADVAIR DISKUS) 500-50 MCG/ACT AEPB    Sig: Inhale 1 puff into the lungs in the morning and at bedtime.    Dispense:  60 each    Refill:  3   hydrALAZINE (APRESOLINE) 25 MG tablet    Sig: TAKE ONE TABLET BY MOUTH 2 TIMES A DAY    Dispense:  180 tablet    Refill:  1   insulin glargine (LANTUS) 100 UNIT/ML Solostar Pen    Sig: Inject 28 Units into the skin once daily.    Dispense:  15 mL    Refill:  4   ipratropium-albuterol (DUONEB) 0.5-2.5 (3) MG/3ML SOLN    Sig: INHALE CONTENTS OF ONE VIAL (3ML) USING NEBULIZER ONCE EVERY 4 HOURS AS NEEDED    Dispense:  180 mL    Refill:  1   losartan (COZAAR) 100 MG  tablet    Sig: TAKE ONE TABLET BY MOUTH EVERY DAY    Dispense:  90 tablet    Refill:  4   metFORMIN (GLUCOPHAGE) 500 MG tablet    Sig: TAKE ONE TABLET BY MOUTH 2 TIMES A DAY WITH A MEAL.    Dispense:  180 tablet    Refill:  1   montelukast (SINGULAIR) 10 MG tablet    Sig: TAKE ONE TABLET BY MOUTH AT BEDTIME    Dispense:  30 tablet    Refill:  3   pantoprazole (PROTONIX) 40 MG tablet    Sig: Take 1 tablet (40 mg  total) by mouth 2 (two) times daily.    Dispense:  60 tablet    Refill:  0   potassium chloride SA (KLOR-CON) 20 MEQ tablet    Sig: TAKE ONE TABLET BY MOUTH 2 TIMES A DAY    Dispense:  180 tablet    Refill:  4   azithromycin (ZITHROMAX) 250 MG tablet    Sig: Take 1 tablet (250 mg total) by mouth daily for 5 days.    Dispense:  6 each    Refill:  0   Blood Pressure Monitoring (BLOOD PRESSURE CUFF) MISC    Sig: USE AS DIRECTED ONCE DAILY AT 12 NOON.    Dispense:  1 each    Refill:  0    Follow-up: Return in about 1 week (around 11/16/2020) for follow up cough/SOB.    Elmarie Shiley, MD

## 2020-11-09 NOTE — Patient Instructions (Signed)
Take prednisone and azithromycin for 5 days. Come back to office for follow up in 1 week.   Stop taking aspirin for now due to episode of blood in the stool.   If you develop bloody stool, multiples episode, you will need to go to Emergency department.

## 2020-11-10 ENCOUNTER — Other Ambulatory Visit: Payer: Self-pay

## 2020-11-10 MED ORDER — PREDNISONE 10 MG PO TABS
30.0000 mg | ORAL_TABLET | Freq: Every day | ORAL | 0 refills | Status: DC
  Filled 2020-11-10: qty 15, 5d supply, fill #0

## 2020-11-13 ENCOUNTER — Other Ambulatory Visit: Payer: Self-pay

## 2020-11-21 ENCOUNTER — Telehealth: Payer: Self-pay | Admitting: Pharmacist

## 2020-11-21 NOTE — Telephone Encounter (Signed)
--   Bradley Mckenzie - Tuesday, November 21, 2020 2:09 PM --Faxed script to [facility] for refill on [facility]

## 2020-11-22 ENCOUNTER — Other Ambulatory Visit: Payer: Self-pay

## 2020-11-22 ENCOUNTER — Ambulatory Visit: Payer: Self-pay | Admitting: Gerontology

## 2020-11-22 VITALS — BP 119/79 | HR 85 | Temp 97.5°F | Ht 69.0 in | Wt 280.4 lb

## 2020-11-22 DIAGNOSIS — N185 Chronic kidney disease, stage 5: Secondary | ICD-10-CM

## 2020-11-22 DIAGNOSIS — R635 Abnormal weight gain: Secondary | ICD-10-CM

## 2020-11-22 DIAGNOSIS — J449 Chronic obstructive pulmonary disease, unspecified: Secondary | ICD-10-CM

## 2020-11-22 DIAGNOSIS — I120 Hypertensive chronic kidney disease with stage 5 chronic kidney disease or end stage renal disease: Secondary | ICD-10-CM

## 2020-11-22 DIAGNOSIS — Z0000 Encounter for general adult medical examination without abnormal findings: Secondary | ICD-10-CM | POA: Insufficient documentation

## 2020-11-22 DIAGNOSIS — E119 Type 2 diabetes mellitus without complications: Secondary | ICD-10-CM

## 2020-11-22 DIAGNOSIS — K921 Melena: Secondary | ICD-10-CM

## 2020-11-22 DIAGNOSIS — E559 Vitamin D deficiency, unspecified: Secondary | ICD-10-CM

## 2020-11-22 MED ORDER — CHOLECALCIFEROL 25 MCG (1000 UT) PO CAPS
1000.0000 [IU] | ORAL_CAPSULE | Freq: Every day | ORAL | 3 refills | Status: DC
  Filled 2020-11-22: qty 30, 30d supply, fill #0

## 2020-11-22 NOTE — Progress Notes (Signed)
Established Patient Office Visit  Subjective:  Patient ID: Bradley Mckenzie., male    DOB: May 17, 1965  Age: 55 y.o. MRN: [id number]  CC:  Chief Complaint  Patient presents with   Follow-up    Diabetes follow-up. Sugars checked at home regularly. Reports it was elevated this morning (156). Usually runs 100-120.    HPI Bradley Mckenzie.  is a 55 y.o. male who  has a past medical history of Asthma, Cardiomyopathy ([place]) (2016), Chronic combined systolic (congestive) and diastolic (congestive) heart failure ([place]), COPD (chronic obstructive pulmonary disease) ([place]), Depression, Diabetes mellitus without complication ([place]) (0350), Hyperlipidemia, Hypertensive cardiomyopathy ([place]) (06/30/2015), Hypertensive heart disease, Morbid obesity ([place]), and Obstructive sleep apnea presents for follow up visit. He completed the course of antibiotics and prednisone that was prescribed during his last visit on 11/09/20 for shortness of breath, cough, and wheezing. Currently, he states that his symptoms has resolved and acid reflux has improved. He states that he's compliant with his medications, denies side effects and continues to make healthy lifestyle changes. Overall, he states that he's doing well and offers no further complaint.  Past Medical History:  Diagnosis Date   Asthma    Cardiomyopathy ([place]) 2016   a. 01/2014 Echo: EF 25-30%;  b. ? ischemic vs non-ischemic.  He's never had an ischemic eval.   Chronic combined systolic (congestive) and diastolic (congestive) heart failure ([place])    a. 01/2014 Echo: EF 25-30%. b. Echo 03/2015: Improved EF of 40-45%   COPD (chronic obstructive pulmonary disease) ([place])    Depression    Diabetes mellitus without complication ([place]) 0938   started med therapy approx 2018   Hyperlipidemia    Hypertensive cardiomyopathy ([place]) 06/30/2015   Hypertensive heart disease    a. Since his 73's.   Morbid obesity ([place])    Obstructive sleep apnea    a. Has not used CPAP since ~  2012.    Past Surgical History:  Procedure Laterality Date   HERNIA REPAIR  2004    Family History  Problem Relation Age of Onset   Heart disease Mother        alive & well.   Heart attack Father 56       died @ 32 of cancer.   Diabetes Father    Cancer Father        "in his stomach"   Cancer Sister    Cancer Paternal Uncle        strong FH malignancy multiple relavies     Social History   Socioeconomic History   Marital status: Married    Spouse name: Not on file   Number of children: 2   Years of education: 12   Highest education level: GED or equivalent  Occupational History   Occupation: unemployed    Comment: hard time getting disability, denied medicaid   Tobacco Use   Smoking status: Never   Smokeless tobacco: Never   Tobacco comments:    Quit in his 78's.  Vaping Use   Vaping Use: Never used  Substance and Sexual Activity   Alcohol use: Yes    Comment: rarely alcohol use at special events   Drug use: Not Currently    Comment: Used marijuana in his teens.   Sexual activity: Not Currently  Other Topics Concern   Not on file  Social History Narrative   Lives in [place] with his wife. Wife no longer working; now on disability. Have stable housing at group home that his wife  used to work at. Sometimes struggles with transportation but not desperate. Wife has working car, his doesn't work. Had food stamps but cut off after 3 months. Wasn't eligible after that bc not able to work. Has been denied twice from disability. He was not able to obtain medicaid   Social Determinants of Health   Financial Resource Strain: Low Risk    Difficulty of Paying Living Expenses: Not hard at all  Food Insecurity: No Food Insecurity   Worried About Charity fundraiser in the Last Year: Never true   Arboriculturist in the Last Year: Never true  Transportation Needs: No Transportation Needs   Lack of Transportation (Medical): No   Lack of Transportation (Non-Medical): No   Physical Activity: Insufficiently Active   Days of Exercise per Week: 3 days   Minutes of Exercise per Session: 20 min  Stress: No Stress Concern Present   Feeling of Stress : Not at all  Social Connections: Moderately Isolated   Frequency of Communication with Friends and Family: Once a week   Frequency of Social Gatherings with Friends and Family: Once a week   Attends Religious Services: 1 to 4 times per year   Active Member of [employer] or Organizations: No   Attends Music therapist: Never   Marital Status: Married  Human resources officer Violence: Not At Risk   Fear of Current or Ex-Partner: No   Emotionally Abused: No   Physically Abused: No   Sexually Abused: No    Outpatient Medications Prior to Visit  Medication Sig Dispense Refill   albuterol (VENTOLIN HFA) 108 (90 Base) MCG/ACT inhaler Inhale 2 puffs into the lungs every 6 (six) hours as needed for wheezing or shortness of breath. 6.7 g 2   atorvastatin (LIPITOR) 10 MG tablet TAKE ONE TABLET BY MOUTH EVERY DAY 90 tablet 4   carvedilol (COREG) 12.5 MG tablet Take 1 tablet (12.5 mg total) by mouth 2 (two) times daily with a meal. 180 tablet 3   cetirizine (ZYRTEC) 10 MG tablet TAKE ONE TABLET BY MOUTH EVERY DAY 90 tablet 1   dapagliflozin propanediol (FARXIGA) 10 MG TABS tablet TAKE ONE TABLET BY MOUTH EVERY DAY BEFORE BREAKFAST 90 tablet 3   fluticasone-salmeterol (ADVAIR DISKUS) 500-50 MCG/ACT AEPB Inhale 1 puff into the lungs in the morning and at bedtime. 60 each 3   hydrALAZINE (APRESOLINE) 25 MG tablet TAKE ONE TABLET BY MOUTH 2 TIMES A DAY 180 tablet 1   insulin glargine (LANTUS) 100 UNIT/ML Solostar Pen Inject 28 Units into the skin once daily. 15 mL 4   ipratropium-albuterol (DUONEB) 0.5-2.5 (3) MG/3ML SOLN INHALE CONTENTS OF ONE VIAL (3ML) USING NEBULIZER ONCE EVERY 4 HOURS AS NEEDED 180 mL 1   losartan (COZAAR) 100 MG tablet TAKE ONE TABLET BY MOUTH EVERY DAY 90 tablet 4   metFORMIN (GLUCOPHAGE) 500 MG tablet  TAKE ONE TABLET BY MOUTH 2 TIMES A DAY WITH A MEAL. 180 tablet 1   montelukast (SINGULAIR) 10 MG tablet TAKE ONE TABLET BY MOUTH AT BEDTIME 30 tablet 3   potassium chloride SA (KLOR-CON) 20 MEQ tablet TAKE ONE TABLET BY MOUTH 2 TIMES A DAY 180 tablet 4   Semaglutide,0.25 or 0.5MG /DOS, 2 MG/1.5ML SOPN Inject 1 mg total into the skin once a week. (0.5mg  dose x 2) 1.5 mL 2   torsemide (DEMADEX) 20 MG tablet Take 2 tablets (40 mg total) by mouth once daily. May take additional 1 tablet (20mg  total) in the evening if  needed. 270 tablet 3   pantoprazole (PROTONIX) 40 MG tablet Take 1 tablet (40 mg total) by mouth 2 (two) times daily. 60 tablet 0   Blood Glucose Monitoring Suppl (RIGHTEST GM550 BLOOD GLUCOSE) w/Device KIT AS DIRECTED 1 kit 0   Blood Pressure Monitoring (BLOOD PRESSURE CUFF) MISC USE AS DIRECTED ONCE DAILY AT 12 NOON. 1 each 0   glucose blood test strip USE AS DIRECTED 100 strip 99   Insulin Pen Needle 32G X 4 MM MISC Use as directed 100 each 11   Rightest GL300 Lancets MISC AS DIRECTED 100 each 99   Semaglutide, 1 MG/DOSE, (OZEMPIC, 1 MG/DOSE,) 2 MG/1.5ML SOPN Inject 1 mg into the skin once a week. 9 mL 3   cholecalciferol (VITAMIN D3) tablet 1,000 Units      No facility-administered medications prior to visit.    No Known Allergies  ROS Review of Systems  Constitutional: Negative.   Respiratory: Negative.    Cardiovascular: Negative.   Endocrine: Negative.   Skin: Negative.   Neurological: Negative.      Objective:    Physical Exam HENT:     Head: Normocephalic and atraumatic.     Mouth/Throat:     Mouth: Mucous membranes are moist.  Eyes:     Extraocular Movements: Extraocular movements intact.     Conjunctiva/sclera: Conjunctivae normal.     Pupils: Pupils are equal, round, and reactive to light.  Cardiovascular:     Rate and Rhythm: Normal rate and regular rhythm.     Pulses: Normal pulses.     Heart sounds: Normal heart sounds.  Pulmonary:     Effort:  Pulmonary effort is normal.     Breath sounds: Normal breath sounds.  Abdominal:     General: Bowel sounds are normal.     Palpations: Abdomen is soft.  Skin:    General: Skin is warm.  Neurological:     General: No focal deficit present.     Mental Status: He is alert and oriented to person, place, and time. Mental status is at baseline.  Psychiatric:        Mood and Affect: Mood normal.        Behavior: Behavior normal.        Thought Content: Thought content normal.        Judgment: Judgment normal.    BP 119/79 (BP Location: Left Arm, Patient Position: Sitting, Cuff Size: Large)   Pulse 85   Temp (!) 97.5 F (36.4 C)   Ht $R'5\' 9"'Xz$  (1.753 m)   Wt 280 lb 6.4 oz (127.2 kg)   SpO2 95%   BMI 41.41 kg/m  Wt Readings from Last 3 Encounters:  11/22/20 280 lb 6.4 oz (127.2 kg)  11/09/20 280 lb 4 oz (127.1 kg)  11/09/20 286 lb 8 oz (130 kg)   Encouraged weight loss  Health Maintenance Due  Topic Date Due   COVID-19 Vaccine (1) Never done   Pneumococcal Vaccine 45-51 Years old (1 - PCV) Never done   OPHTHALMOLOGY EXAM  Never done   HIV Screening  Never done   Hepatitis C Screening  Never done   TETANUS/TDAP  Never done   Zoster Vaccines- Shingrix (1 of 2) Never done   COLONOSCOPY (Pts 45-68yrs Insurance coverage will need to be confirmed)  Never done   INFLUENZA VACCINE  Never done    There are no preventive care reminders to display for this patient.  Lab Results  Component Value Date   TSH  0.817 09/30/2019   Lab Results  Component Value Date   WBC 10.9 (H) 06/06/2020   HGB 13.9 06/06/2020   HCT 45.7 06/06/2020   MCV 86.7 06/06/2020   PLT 253 06/06/2020   Lab Results  Component Value Date   NA 139 11/01/2020   K 4.0 11/01/2020   CO2 24 11/01/2020   GLUCOSE 187 (H) 11/01/2020   BUN 12 11/01/2020   CREATININE 1.62 (H) 11/01/2020   BILITOT <0.2 02/24/2020   ALKPHOS 104 02/24/2020   AST 18 02/24/2020   ALT 28 02/24/2020   PROT 8.5 02/24/2020   ALBUMIN 4.7  02/24/2020   CALCIUM 9.5 11/01/2020   ANIONGAP 10 06/06/2020   EGFR 50 (L) 11/01/2020   Lab Results  Component Value Date   CHOL 167 09/30/2019   Lab Results  Component Value Date   HDL 44 09/30/2019   Lab Results  Component Value Date   LDLCALC 88 09/30/2019   Lab Results  Component Value Date   TRIG 210 (H) 09/30/2019   Lab Results  Component Value Date   CHOLHDL 3.8 09/30/2019   Lab Results  Component Value Date   HGBA1C 8.1 (H) 11/01/2020      Assessment & Plan:    1. 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 Weight gain -Will check - TSH   3. Bloody stool No hematochezia, will check - CBC  4. Vitamin D deficiency - His Vitamin D level was 33.2 ng/ml, he will continue on daily 1,000 units of Vitamin D3. - Cholecalciferol 25 MCG (1000 UT) capsule; Take 1 capsule (1,000 Units total) by mouth once daily.  Dispense: 30 capsule; Refill: 3  5. Health care maintenance - He will follow up with Gastroenterology Dr Allen Norris on 12/18/20  6. Diabetes mellitus without complication ([place]) - Will recheck - HgB A1c; Future  7. Chronic obstructive pulmonary disease, unspecified COPD type ([place]) - His breathing is stable, he will continue on current medication, and will follow up with - Ambulatory referral to Pulmonology     Follow-up: Return in about 3 months (around 02/13/2021), or if symptoms worsen or fail to improve.    Mayvis Agudelo Jerold Coombe, NP

## 2020-11-22 NOTE — Patient Instructions (Signed)
DASH Eating Plan DASH stands for Dietary Approaches to Stop Hypertension. The DASH eating plan is a healthy eating plan that has been shown to: Reduce high blood pressure (hypertension). Reduce your risk for type 2 diabetes, heart disease, and stroke. Help with weight loss. What are tips for following this plan? Reading food labels Check food labels for the amount of salt (sodium) per serving. Choose foods with less than 5 percent of the Daily Value of sodium. Generally, foods with less than 300 milligrams (mg) of sodium per serving fit into this eating plan. To find whole grains, look for the word "whole" as the first word in the ingredient list. Shopping Buy products labeled as "low-sodium" or "no salt added." Buy fresh foods. Avoid canned foods and pre-made or frozen meals. Cooking Avoid adding salt when cooking. Use salt-free seasonings or herbs instead of table salt or sea salt. Check with your health care provider or pharmacist before using salt substitutes. Do not fry foods. Cook foods using healthy methods such as baking, boiling, grilling, roasting, and broiling instead. Cook with heart-healthy oils, such as olive, canola, avocado, soybean, or sunflower oil. Meal planning  Eat a balanced diet that includes: 4 or more servings of fruits and 4 or more servings of vegetables each day. Try to fill one-half of your plate with fruits and vegetables. 6-8 servings of whole grains each day. Less than 6 oz (170 g) of lean meat, poultry, or fish each day. A 3-oz (85-g) serving of meat is about the same size as a deck of cards. One egg equals 1 oz (28 g). 2-3 servings of low-fat dairy each day. One serving is 1 cup (237 mL). 1 serving of nuts, seeds, or beans 5 times each week. 2-3 servings of heart-healthy fats. Healthy fats called omega-3 fatty acids are found in foods such as walnuts, flaxseeds, fortified milks, and eggs. These fats are also found in cold-water fish, such as sardines, salmon,  and mackerel. Limit how much you eat of: Canned or prepackaged foods. Food that is high in trans fat, such as some fried foods. Food that is high in saturated fat, such as fatty meat. Desserts and other sweets, sugary drinks, and other foods with added sugar. Full-fat dairy products. Do not salt foods before eating. Do not eat more than 4 egg yolks a week. Try to eat at least 2 vegetarian meals a week. Eat more home-cooked food and less restaurant, buffet, and fast food. Lifestyle When eating at a restaurant, ask that your food be prepared with less salt or no salt, if possible. If you drink alcohol: Limit how much you use to: 0-1 drink a day for women who are not pregnant. 0-2 drinks a day for men. Be aware of how much alcohol is in your drink. In the [place] one drink equals one 12 oz bottle of beer (355 mL), one 5 oz glass of wine (148 mL), or one 1 oz glass of hard liquor (44 mL). General information Avoid eating more than 2,300 mg of salt a day. If you have hypertension, you may need to reduce your sodium intake to 1,500 mg a day. Work with your health care provider to maintain a healthy body weight or to lose weight. Ask what an ideal weight is for you. Get at least 30 minutes of exercise that causes your heart to beat faster (aerobic exercise) most days of the week. Activities may include walking, swimming, or biking. Work with your health care provider or dietitian to   adjust your eating plan to your individual calorie needs. What foods should I eat? Fruits All fresh, dried, or frozen fruit. Canned fruit in natural juice (without added sugar). Vegetables Fresh or frozen vegetables (raw, steamed, roasted, or grilled). Low-sodium or reduced-sodium tomato and vegetable juice. Low-sodium or reduced-sodium tomato sauce and tomato paste. Low-sodium or reduced-sodium canned vegetables. Grains Whole-grain or whole-wheat bread. Whole-grain or whole-wheat pasta. Brown rice. Oatmeal. Quinoa.  Bulgur. Whole-grain and low-sodium cereals. Pita bread. Low-fat, low-sodium crackers. Whole-wheat flour tortillas. Meats and other proteins Skinless chicken or turkey. Ground chicken or turkey. Pork with fat trimmed off. Fish and seafood. Egg whites. Dried beans, peas, or lentils. Unsalted nuts, nut butters, and seeds. Unsalted canned beans. Lean cuts of beef with fat trimmed off. Low-sodium, lean precooked or cured meat, such as sausages or meat loaves. Dairy Low-fat (1%) or fat-free (skim) milk. Reduced-fat, low-fat, or fat-free cheeses. Nonfat, low-sodium ricotta or cottage cheese. Low-fat or nonfat yogurt. Low-fat, low-sodium cheese. Fats and oils Soft margarine without trans fats. Vegetable oil. Reduced-fat, low-fat, or light mayonnaise and salad dressings (reduced-sodium). Canola, safflower, olive, avocado, soybean, and sunflower oils. Avocado. Seasonings and condiments Herbs. Spices. Seasoning mixes without salt. Other foods Unsalted popcorn and pretzels. Fat-free sweets. The items listed above may not be a complete list of foods and beverages you can eat. Contact a dietitian for more information. What foods should I avoid? Fruits Canned fruit in a light or heavy syrup. Fried fruit. Fruit in cream or butter sauce. Vegetables Creamed or fried vegetables. Vegetables in a cheese sauce. Regular canned vegetables (not low-sodium or reduced-sodium). Regular canned tomato sauce and paste (not low-sodium or reduced-sodium). Regular tomato and vegetable juice (not low-sodium or reduced-sodium). Pickles. Olives. Grains Baked goods made with fat, such as croissants, muffins, or some breads. Dry pasta or rice meal packs. Meats and other proteins Fatty cuts of meat. Ribs. Fried meat. Bacon. Bologna, salami, and other precooked or cured meats, such as sausages or meat loaves. Fat from the back of a pig (fatback). Bratwurst. Salted nuts and seeds. Canned beans with added salt. Canned or smoked fish.  Whole eggs or egg yolks. Chicken or turkey with skin. Dairy Whole or 2% milk, cream, and half-and-half. Whole or full-fat cream cheese. Whole-fat or sweetened yogurt. Full-fat cheese. Nondairy creamers. Whipped toppings. Processed cheese and cheese spreads. Fats and oils Butter. Stick margarine. Lard. Shortening. Ghee. Bacon fat. Tropical oils, such as coconut, palm kernel, or palm oil. Seasonings and condiments Onion salt, garlic salt, seasoned salt, table salt, and sea salt. Worcestershire sauce. Tartar sauce. Barbecue sauce. Teriyaki sauce. Soy sauce, including reduced-sodium. Steak sauce. Canned and packaged gravies. Fish sauce. Oyster sauce. Cocktail sauce. Store-bought horseradish. Ketchup. Mustard. Meat flavorings and tenderizers. Bouillon cubes. Hot sauces. Pre-made or packaged marinades. Pre-made or packaged taco seasonings. Relishes. Regular salad dressings. Other foods Salted popcorn and pretzels. The items listed above may not be a complete list of foods and beverages you should avoid. Contact a dietitian for more information. Where to find more information National Heart, Lung, and Blood Institute: www.nhlbi.nih.gov American Heart Association: www.heart.org Academy of Nutrition and Dietetics: www.eatright.org National Kidney Foundation: www.kidney.org Summary The DASH eating plan is a healthy eating plan that has been shown to reduce high blood pressure (hypertension). It may also reduce your risk for type 2 diabetes, heart disease, and stroke. When on the DASH eating plan, aim to eat more fresh fruits and vegetables, whole grains, lean proteins, low-fat dairy, and heart-healthy fats. With the DASH   eating plan, you should limit salt (sodium) intake to 2,300 mg a day. If you have hypertension, you may need to reduce your sodium intake to 1,500 mg a day. Work with your health care provider or dietitian to adjust your eating plan to your individual calorie needs. This information is not  intended to replace advice given to you by your health care provider. Make sure you discuss any questions you have with your health care provider. Document Revised: 12/04/2018 Document Reviewed: 12/04/2018 Elsevier Patient Education  2022 [place] for Diabetes Mellitus, Adult Carbohydrate counting is a method of keeping track of how many carbohydrates you eat. Eating carbohydrates increases the amount of sugar (glucose) in the blood. Counting how many carbohydrates you eat improves how well you manage your blood glucose. This, in turn, helps you manage your diabetes. Carbohydrates are measured in grams (g) per serving. It is important to know how many carbohydrates (in grams or by serving size) you can have in each meal. This is different for every person. A dietitian can help you make a meal plan and calculate how many carbohydrates you should have at each meal and snack. What foods contain carbohydrates? Carbohydrates are found in the following foods: Grains, such as breads and cereals. Dried beans and soy products. Starchy vegetables, such as potatoes, peas, and corn. Fruit and fruit juices. Milk and yogurt. Sweets and snack foods, such as cake, cookies, candy, chips, and soft drinks. How do I count carbohydrates in foods? There are two ways to count carbohydrates in food. You can read food labels or learn standard serving sizes of foods. You can use either of these methods or a combination of both. Using the Nutrition Facts label The Nutrition Facts list is included on the labels of almost all packaged foods and beverages in the [place]. It includes: The serving size. Information about nutrients in each serving, including the grams of carbohydrate per serving. To use the Nutrition Facts, decide how many servings you will have. Then, multiply the number of servings by the number of carbohydrates per serving. The resulting number is the total grams of  carbohydrates that you will be having. Learning the standard serving sizes of foods When you eat carbohydrate foods that are not packaged or do not include Nutrition Facts on the label, you need to measure the servings in order to count the grams of carbohydrates. Measure the foods that you will eat with a food scale or measuring cup, if needed. Decide how many standard-size servings you will eat. Multiply the number of servings by 15. For foods that contain carbohydrates, one serving equals 15 g of carbohydrates. For example, if you eat 2 cups or 10 oz (300 g) of strawberries, you will have eaten 2 servings and 30 g of carbohydrates (2 servings x 15 g = 30 g). For foods that have more than one food mixed, such as soups and casseroles, you must count the carbohydrates in each food that is included. The following list contains standard serving sizes of common carbohydrate-rich foods. Each of these servings has about 15 g of carbohydrates: 1 slice of bread. 1 six-inch (15 cm) tortilla. ? cup or 2 oz (53 g) cooked rice or pasta.  cup or 3 oz (85 g) cooked or canned, drained and rinsed beans or lentils.  cup or 3 oz (85 g) starchy vegetable, such as peas, corn, or squash.  cup or 4 oz (120 g) hot cereal.  cup or 3 oz (85  g) boiled or mashed potatoes, or  or 3 oz (85 g) of a large baked potato.  cup or 4 fl oz (118 mL) fruit juice. 1 cup or 8 fl oz (237 mL) milk. 1 small or 4 oz (106 g) apple.  or 2 oz (63 g) of a medium banana. 1 cup or 5 oz (150 g) strawberries. 3 cups or 1 oz (28.3 g) popped popcorn. What is an example of carbohydrate counting? To calculate the grams of carbohydrates in this sample meal, follow the steps shown below. Sample meal 3 oz (85 g) chicken breast. ? cup or 4 oz (106 g) brown rice.  cup or 3 oz (85 g) corn. 1 cup or 8 fl oz (237 mL) milk. 1 cup or 5 oz (150 g) strawberries with sugar-free whipped topping. Carbohydrate calculation Identify the foods that  contain carbohydrates: Rice. Corn. Milk. Strawberries. Calculate how many servings you have of each food: 2 servings rice. 1 serving corn. 1 serving milk. 1 serving strawberries. Multiply each number of servings by 15 g: 2 servings rice x 15 g = 30 g. 1 serving corn x 15 g = 15 g. 1 serving milk x 15 g = 15 g. 1 serving strawberries x 15 g = 15 g. Add together all of the amounts to find the total grams of carbohydrates eaten: 30 g + 15 g + 15 g + 15 g = 75 g of carbohydrates total. What are tips for following this plan? Shopping Develop a meal plan and then make a shopping list. Buy fresh and frozen vegetables, fresh and frozen fruit, dairy, eggs, beans, lentils, and whole grains. Look at food labels. Choose foods that have more fiber and less sugar. Avoid processed foods and foods with added sugars. Meal planning Aim to have the same number of grams of carbohydrates at each meal and for each snack time. Plan to have regular, balanced meals and snacks. Where to find more information American Diabetes Association: diabetes.org Centers for Disease Control and Prevention: StoreMirror.com.cy Academy of Nutrition and Dietetics: eatright.org Association of Diabetes Care & Education Specialists: diabeteseducator.org Summary Carbohydrate counting is a method of keeping track of how many carbohydrates you eat. Eating carbohydrates increases the amount of sugar (glucose) in your blood. Counting how many carbohydrates you eat improves how well you manage your blood glucose. This helps you manage your diabetes. A dietitian can help you make a meal plan and calculate how many carbohydrates you should have at each meal and snack. This information is not intended to replace advice given to you by your health care provider. Make sure you discuss any questions you have with your health care provider. Document Revised: 08/04/2019 Document Reviewed: 08/04/2019 Elsevier Patient Education  [place].

## 2020-11-23 ENCOUNTER — Telehealth: Payer: Self-pay | Admitting: Pharmacist

## 2020-11-23 LAB — SPECIMEN STATUS

## 2020-11-23 NOTE — Telephone Encounter (Signed)
11/23/2020 10:55:42 AM - Appeals faxed to [facility] on [place] 1mg   -- Elmer Picker - Thursday, November 23, 2020 10:47 AM --today called [facility] spoke with Tie-she reviewed the account stated application received, Not approved and now the file was Finished/Closed due to no activity in over 90 days. Explained I faxed renewal August 09, 2020 and called 09/08/20 to check on Ozempic-per Ronny Bacon was told that there was a Tree surgeon of this medication and we could exppect this medication the middle or end of Oct. I am not sure if she even reveiwed the account that day. Also per Tie appears there is a discrepancy in the income on the application and the Soc. Sec statement submitted-explained the monthly amt $ 1148.00 equals the annual amt of $55,732.20 on the application. I am not sure if this was the reason application not originally approved. I have today sent APPEAL letter for them to review in hopes that they will overturn and approve the patient for Ozempic 1mg . I have also discussed with Adam for him to know what is going on. We have been filing samples for patient, he has to stick himself 2 times to get the dose that is required.

## 2020-11-29 ENCOUNTER — Telehealth: Payer: Self-pay | Admitting: Pharmacist

## 2020-11-29 NOTE — Telephone Encounter (Signed)
11/29/2020 9:03:51 AM - [facility] app for Ozempic 1mg  till 11/22/21 ID# [id number]  -- Bradley Mckenzie - Wednesday, November 29, 2020 9:02 AM --Received call from supervisor-Jen to let me know Appeal was approved-patient approved till 11/22/21 for Ozempic. Patient ID# [id number].

## 2020-11-30 LAB — BASIC METABOLIC PANEL
BUN/Creatinine Ratio: 13 (ref 9–20)
BUN: 21 mg/dL (ref 6–24)
CO2: 21 mmol/L (ref 20–29)
Calcium: 10.2 mg/dL (ref 8.7–10.2)
Chloride: 102 mmol/L (ref 96–106)
Creatinine, Ser: 1.59 mg/dL — ABNORMAL HIGH (ref 0.76–1.27)
Glucose: 148 mg/dL — ABNORMAL HIGH (ref 70–99)
Potassium: 4.1 mmol/L (ref 3.5–5.2)
Sodium: 138 mmol/L (ref 134–144)
eGFR: 52 mL/min/{1.73_m2} — ABNORMAL LOW (ref >59)

## 2020-11-30 LAB — TSH: TSH: 1.14 u[IU]/mL (ref 0.450–4.500)

## 2020-11-30 LAB — HGB A1C W/O EAG: Hgb A1c MFr Bld: 8.4 % — ABNORMAL HIGH (ref 4.8–5.6)

## 2020-11-30 LAB — SPECIMEN STATUS REPORT

## 2020-12-01 ENCOUNTER — Other Ambulatory Visit: Payer: Self-pay | Admitting: Gerontology

## 2020-12-01 DIAGNOSIS — E1165 Type 2 diabetes mellitus with hyperglycemia: Secondary | ICD-10-CM

## 2020-12-06 ENCOUNTER — Other Ambulatory Visit: Payer: Self-pay

## 2020-12-06 MED ORDER — OZEMPIC (0.25 OR 0.5 MG/DOSE) 2 MG/1.5ML ~~LOC~~ SOPN
0.5000 mg | PEN_INJECTOR | SUBCUTANEOUS | 2 refills | Status: DC
  Filled 2020-12-06: qty 3, 30d supply, fill #0
  Filled 2021-01-08: qty 3, 30d supply, fill #1
  Filled 2021-02-01: qty 3, 30d supply, fill #2

## 2020-12-13 ENCOUNTER — Other Ambulatory Visit: Payer: Self-pay

## 2020-12-18 ENCOUNTER — Ambulatory Visit: Payer: Self-pay | Admitting: Gastroenterology

## 2020-12-18 NOTE — Progress Notes (Deleted)
Gastroenterology Consultation  Referring Provider:     Elmarie Shiley, MD Primary Care Physician:  Langston Reusing, NP Primary Gastroenterologist:  Dr. Allen Norris     Reason for Consultation:     Bloody stools        HPI:   Bradley Mckenzie. is a 55 y.o. y/o male referred for consultation & management of bloody stools by Dr. Hattie Perch, Lonny Prude, NP.   This patient comes in today with a history of bloody stools.  The patient has a past medical history that includes congestive heart failure depression and COPD asthma cardiomyopathy morbid obesity hypertension diabetes and hyperlipidemia.  The patient had a estimate of his ejection fraction in 2016 which was 25 to 30%.    Past Surgical History:  Procedure Laterality Date   HERNIA REPAIR  2004    Prior to Admission medications   Medication Sig Start Date End Date Taking? Authorizing Provider  albuterol (VENTOLIN HFA) 108 (90 Base) MCG/ACT inhaler Inhale 2 puffs into the lungs every 6 (six) hours as needed for wheezing or shortness of breath. 11/09/20   Regalado, Belkys A, MD  atorvastatin (LIPITOR) 10 MG tablet TAKE ONE TABLET BY MOUTH EVERY DAY 11/09/20 11/09/21  Regalado, Belkys A, MD  Blood Pressure Monitoring (BLOOD PRESSURE CUFF) MISC USE AS DIRECTED ONCE DAILY AT 12 NOON. 11/09/20   Regalado, Belkys A, MD  carvedilol (COREG) 12.5 MG tablet Take 1 tablet (12.5 mg total) by mouth 2 (two) times daily with a meal. 11/09/20   Regalado, Belkys A, MD  cetirizine (ZYRTEC) 10 MG tablet TAKE ONE TABLET BY MOUTH EVERY DAY 11/09/20 11/09/21  Regalado, Jerald Kief A, MD  Cholecalciferol 25 MCG (1000 UT) capsule Take 1 capsule (1,000 Units total) by mouth once daily. 11/22/20   Iloabachie, Chioma E, NP  dapagliflozin propanediol (FARXIGA) 10 MG TABS tablet TAKE ONE TABLET BY MOUTH EVERY DAY BEFORE BREAKFAST 11/09/20 11/09/21  Regalado, Belkys A, MD  fluticasone-salmeterol (ADVAIR DISKUS) 500-50 MCG/ACT AEPB Inhale 1 puff into the lungs in the  morning and at bedtime. 11/09/20   Regalado, Belkys A, MD  hydrALAZINE (APRESOLINE) 25 MG tablet TAKE ONE TABLET BY MOUTH 2 TIMES A DAY 11/09/20 07/24/21  Regalado, Belkys A, MD  insulin glargine (LANTUS) 100 UNIT/ML Solostar Pen Inject 28 Units into the skin once daily. 11/09/20 08/04/21  Regalado, Jerald Kief A, MD  Insulin Pen Needle 32G X 4 MM MISC Use as directed 06/20/20   Cammy Copa, RPH  ipratropium-albuterol (DUONEB) 0.5-2.5 (3) MG/3ML SOLN INHALE CONTENTS OF ONE VIAL (3ML) USING NEBULIZER ONCE EVERY 4 HOURS AS NEEDED 11/09/20 11/09/21  Regalado, Belkys A, MD  losartan (COZAAR) 100 MG tablet TAKE ONE TABLET BY MOUTH EVERY DAY 11/09/20 11/09/21  Regalado, Jerald Kief A, MD  metFORMIN (GLUCOPHAGE) 500 MG tablet TAKE ONE TABLET BY MOUTH 2 TIMES A DAY WITH A MEAL. 11/09/20 11/09/21  Regalado, Belkys A, MD  montelukast (SINGULAIR) 10 MG tablet TAKE ONE TABLET BY MOUTH AT BEDTIME 11/09/20 11/09/21  Regalado, Belkys A, MD  potassium chloride SA (KLOR-CON) 20 MEQ tablet TAKE ONE TABLET BY MOUTH 2 TIMES A DAY 11/09/20 11/09/21  Regalado, Belkys A, MD  Semaglutide, 1 MG/DOSE, (OZEMPIC, 1 MG/DOSE,) 2 MG/1.5ML SOPN Inject 1 mg into the skin once a week. 07/26/20 01/22/21  Iloabachie, Chioma E, NP  Semaglutide,0.25 or 0.5MG /DOS, (OZEMPIC, 0.25 OR 0.5 MG/DOSE,) 2 MG/1.5ML SOPN Inject 1 mg total into the skin once a week. (0.5mg  dose x 2) 12/06/20   Iloabachie, Chioma  E, NP  torsemide (DEMADEX) 20 MG tablet Take 2 tablets (40 mg total) by mouth once daily. May take additional 1 tablet (20mg  total) in the evening if needed. 07/26/20   Alisa Graff, FNP    Family History  Problem Relation Age of Onset   Heart disease Mother        alive & well.   Heart attack Father 71       died @ 79 of cancer.   Diabetes Father    Cancer Father        "in his stomach"   Cancer Sister    Cancer Paternal Uncle        strong FH malignancy multiple relavies      Social History   Tobacco Use   Smoking status: Never    Smokeless tobacco: Never   Tobacco comments:    Quit in his 57's.  Vaping Use   Vaping Use: Never used  Substance Use Topics   Alcohol use: Yes    Comment: rarely alcohol use at special events   Drug use: Not Currently    Comment: Used marijuana in his teens.    Allergies as of 12/18/2020   (No Known Allergies)    Review of Systems:    All systems reviewed and negative except where noted in HPI.   Physical Exam:  There were no vitals taken for this visit. No LMP for male patient. General:   Alert,  Well-developed, well-nourished, pleasant and cooperative in NAD Head:  Normocephalic and atraumatic. Eyes:  Sclera clear, no icterus.   Conjunctiva pink. Ears:  Normal auditory acuity. Neck:  Supple; no masses or thyromegaly. Lungs:  Respirations even and unlabored.  Clear throughout to auscultation.   No wheezes, crackles, or rhonchi. No acute distress. Heart:  Regular rate and rhythm; no murmurs, clicks, rubs, or gallops. Abdomen:  Normal bowel sounds.  No bruits.  Soft, non-tender and non-distended without masses, hepatosplenomegaly or hernias noted.  No guarding or rebound tenderness.  Negative Carnett sign.   Rectal:  Deferred.  Pulses:  Normal pulses noted. Extremities:  No clubbing or edema.  No cyanosis. Neurologic:  Alert and oriented x3;  grossly normal neurologically. Skin:  Intact without significant lesions or rashes.  No jaundice. Lymph Nodes:  No significant cervical adenopathy. Psych:  Alert and cooperative. Normal mood and affect.  Imaging Studies: No results found.  Assessment and Plan:   Bradley Mckenzie. is a 55 y.o. y/o male ***    Lucilla Lame, MD. Marval Regal    Note: This dictation was prepared with Dragon dictation along with smaller phrase technology. Any transcriptional errors that result from this process are unintentional.

## 2020-12-26 ENCOUNTER — Other Ambulatory Visit: Payer: Self-pay

## 2020-12-29 ENCOUNTER — Emergency Department
Admission: EM | Admit: 2020-12-29 | Discharge: 2020-12-29 | Disposition: A | Discharge status: Home / Self Care | Payer: Self-pay | Attending: Emergency Medicine | Admitting: Emergency Medicine

## 2020-12-29 ENCOUNTER — Other Ambulatory Visit: Payer: Self-pay

## 2020-12-29 DIAGNOSIS — Z5321 Procedure and treatment not carried out due to patient leaving prior to being seen by health care provider: Secondary | ICD-10-CM | POA: Insufficient documentation

## 2020-12-29 DIAGNOSIS — K625 Hemorrhage of anus and rectum: Secondary | ICD-10-CM | POA: Insufficient documentation

## 2020-12-29 LAB — CBC
HCT: 42.4 % (ref 39.0–52.0)
Hemoglobin: 13.4 g/dL (ref 13.0–17.0)
MCH: 26.4 pg (ref 26.0–34.0)
MCHC: 31.6 g/dL (ref 30.0–36.0)
MCV: 83.6 fL (ref 80.0–100.0)
Platelets: 279 K/uL (ref 150–400)
RBC: 5.07 MIL/uL (ref 4.22–5.81)
RDW: 16 % — ABNORMAL HIGH (ref 11.5–15.5)
WBC: 9.9 K/uL (ref 4.0–10.5)
nRBC: 0 % (ref 0.0–0.2)

## 2020-12-29 LAB — BASIC METABOLIC PANEL
Anion gap: 8 (ref 5–15)
BUN: 14 mg/dL (ref 6–20)
CO2: 26 mmol/L (ref 22–32)
Calcium: 9.1 mg/dL (ref 8.9–10.3)
Chloride: 101 mmol/L (ref 98–111)
Creatinine, Ser: 1.7 mg/dL — ABNORMAL HIGH (ref 0.61–1.24)
GFR, Estimated: 47 mL/min — ABNORMAL LOW (ref >60)
Glucose, Bld: 191 mg/dL — ABNORMAL HIGH (ref 70–99)
Potassium: 3.9 mmol/L (ref 3.5–5.1)
Sodium: 135 mmol/L (ref 135–145)

## 2020-12-29 NOTE — ED Triage Notes (Signed)
Pt arrives with c/o blood in his stool that started yesterday. Per pt, he had an appt with a GI for this issue, but could not make the appt. Pt denies ABD pain or n/v.

## 2020-12-29 NOTE — ED Notes (Signed)
Pt told registration staff of his intent to leave.  Pt did not notify nursing staff.

## 2021-01-02 ENCOUNTER — Other Ambulatory Visit: Payer: Self-pay

## 2021-01-03 ENCOUNTER — Other Ambulatory Visit: Payer: Self-pay

## 2021-01-09 ENCOUNTER — Other Ambulatory Visit: Payer: Self-pay

## 2021-01-10 ENCOUNTER — Other Ambulatory Visit: Payer: Self-pay

## 2021-01-11 ENCOUNTER — Other Ambulatory Visit: Payer: Self-pay

## 2021-01-17 ENCOUNTER — Other Ambulatory Visit: Payer: Self-pay

## 2021-01-18 ENCOUNTER — Other Ambulatory Visit: Payer: Self-pay

## 2021-01-29 ENCOUNTER — Other Ambulatory Visit: Payer: Self-pay

## 2021-01-30 ENCOUNTER — Other Ambulatory Visit: Payer: Self-pay

## 2021-01-30 ENCOUNTER — Other Ambulatory Visit: Payer: Self-pay | Admitting: Family

## 2021-01-30 MED ORDER — DAPAGLIFLOZIN PROPANEDIOL 10 MG PO TABS
ORAL_TABLET | ORAL | 3 refills | Status: AC
  Filled 2021-01-30: qty 60, 60d supply, fill #0
  Filled 2021-05-28 – 2021-06-24 (×2): qty 60, 60d supply, fill #1
  Filled 2021-06-25: qty 60, 60d supply, fill #0
  Filled 2021-06-27 – 2021-08-05 (×2): qty 30, 30d supply, fill #0
  Filled 2021-08-06: qty 90, 90d supply, fill #0

## 2021-01-30 NOTE — Progress Notes (Signed)
RX for farxiga sent to [facility]

## 2021-01-31 ENCOUNTER — Other Ambulatory Visit: Payer: Self-pay

## 2021-02-01 ENCOUNTER — Other Ambulatory Visit: Payer: Self-pay | Admitting: Emergency Medicine

## 2021-02-01 ENCOUNTER — Other Ambulatory Visit: Payer: Self-pay

## 2021-02-01 DIAGNOSIS — E119 Type 2 diabetes mellitus without complications: Secondary | ICD-10-CM

## 2021-02-01 MED ORDER — OZEMPIC (1 MG/DOSE) 4 MG/3ML ~~LOC~~ SOPN
1.0000 mg | PEN_INJECTOR | SUBCUTANEOUS | 0 refills | Status: DC
  Filled 2021-02-01: qty 12, 112d supply, fill #0

## 2021-02-05 ENCOUNTER — Other Ambulatory Visit: Payer: Self-pay

## 2021-02-07 ENCOUNTER — Other Ambulatory Visit: Payer: Self-pay

## 2021-02-08 ENCOUNTER — Other Ambulatory Visit: Payer: Self-pay

## 2021-02-08 ENCOUNTER — Other Ambulatory Visit: Payer: Self-pay | Admitting: Gerontology

## 2021-02-08 DIAGNOSIS — J449 Chronic obstructive pulmonary disease, unspecified: Secondary | ICD-10-CM

## 2021-02-08 MED ORDER — ALBUTEROL SULFATE HFA 108 (90 BASE) MCG/ACT IN AERS
2.0000 | INHALATION_SPRAY | Freq: Four times a day (QID) | RESPIRATORY_TRACT | 4 refills | Status: DC | PRN
  Filled 2021-02-08: qty 8.5, 25d supply, fill #0
  Filled 2021-10-21: qty 6.7, 25d supply, fill #0
  Filled 2021-11-19: qty 6.7, 25d supply, fill #1

## 2021-02-13 ENCOUNTER — Other Ambulatory Visit: Payer: Self-pay

## 2021-02-13 ENCOUNTER — Ambulatory Visit: Payer: Self-pay | Attending: Family | Admitting: Family

## 2021-02-13 ENCOUNTER — Encounter: Payer: Self-pay | Admitting: Family

## 2021-02-13 ENCOUNTER — Other Ambulatory Visit: Payer: Self-pay | Admitting: Gerontology

## 2021-02-13 VITALS — BP 122/62 | HR 102 | Resp 20 | Ht 69.0 in | Wt 277.4 lb

## 2021-02-13 VITALS — BP 96/68 | HR 109 | Resp 16 | Ht 69.0 in | Wt 282.6 lb

## 2021-02-13 DIAGNOSIS — R7989 Other specified abnormal findings of blood chemistry: Secondary | ICD-10-CM

## 2021-02-13 DIAGNOSIS — Z7984 Long term (current) use of oral hypoglycemic drugs: Secondary | ICD-10-CM | POA: Insufficient documentation

## 2021-02-13 DIAGNOSIS — R252 Cramp and spasm: Secondary | ICD-10-CM | POA: Insufficient documentation

## 2021-02-13 DIAGNOSIS — I110 Hypertensive heart disease with heart failure: Secondary | ICD-10-CM | POA: Insufficient documentation

## 2021-02-13 DIAGNOSIS — I5032 Chronic diastolic (congestive) heart failure: Secondary | ICD-10-CM

## 2021-02-13 DIAGNOSIS — G4733 Obstructive sleep apnea (adult) (pediatric): Secondary | ICD-10-CM | POA: Insufficient documentation

## 2021-02-13 DIAGNOSIS — E119 Type 2 diabetes mellitus without complications: Secondary | ICD-10-CM

## 2021-02-13 DIAGNOSIS — Z7951 Long term (current) use of inhaled steroids: Secondary | ICD-10-CM | POA: Insufficient documentation

## 2021-02-13 DIAGNOSIS — I5042 Chronic combined systolic (congestive) and diastolic (congestive) heart failure: Secondary | ICD-10-CM | POA: Insufficient documentation

## 2021-02-13 DIAGNOSIS — E785 Hyperlipidemia, unspecified: Secondary | ICD-10-CM | POA: Insufficient documentation

## 2021-02-13 DIAGNOSIS — J45909 Unspecified asthma, uncomplicated: Secondary | ICD-10-CM | POA: Insufficient documentation

## 2021-02-13 DIAGNOSIS — I10 Essential (primary) hypertension: Secondary | ICD-10-CM

## 2021-02-13 DIAGNOSIS — Z794 Long term (current) use of insulin: Secondary | ICD-10-CM | POA: Insufficient documentation

## 2021-02-13 MED ORDER — RIGHTEST GS550 BLOOD GLUCOSE VI STRP
ORAL_STRIP | 0 refills | Status: DC
  Filled 2021-02-13: qty 50, 50d supply, fill #0

## 2021-02-13 MED ORDER — RIGHTEST GL300 LANCETS MISC
1.0000 | Freq: Every day | 0 refills | Status: DC
  Filled 2021-02-13: qty 100, 100d supply, fill #0

## 2021-02-13 NOTE — Progress Notes (Signed)
Patient ID: Bradley Khader., male    DOB: 01-28-1965, 56 y.o.   MRN: [id number]  HPI  Bradley Mckenzie is a 56 y/o male who has a history of diabetes, HTN, obstructive sleep apnea, depression, hyperlipidemia, morbid obesity and heart failure with a reduced ejection fraction. Remote tobacco exposure.   Echo report from 10/28/2018 reviewed and showed an EF of 60-65% along with mildly elevated PA pressure. Echo done 09/02/15 with an EF of 30-35%, mild mitral regurgitation and no aortic stenosis. EF has declined from 40-45% in March 2017 but he had been inconsistent taking his medications & not using his CPAP during this time.  Went to the ED on 12/29/20 for rectal bleeding, but left before being seen.   Returns today for a follow-up visit with a chief complaint of moderate shortness of breath with minimal exertion. He describes this as chronic in nature having been present for several years. He has associated fatigue, cough, dizziness, headaches, and occasional wheezing. He denies any difficulty sleeping, palpitations or pedal edema. Continues to have intermittent cramping in his hands and his sides at time.   He continues to weigh daily, reports that his weights do not fluctuate. He checks his BS daily and reports they are between 101-120.   Past Surgical History:  Procedure Laterality Date   HERNIA REPAIR  2004   Family History  Problem Relation Age of Onset   Heart disease Mother        alive & well.   Heart attack Father 27       died @ 61 of cancer.   Diabetes Father    Cancer Father        "in his stomach"   Cancer Sister    Cancer Paternal Uncle        strong FH malignancy multiple relavies    Social History   Tobacco Use   Smoking status: Never   Smokeless tobacco: Never   Tobacco comments:    Quit in his 54's.  Substance Use Topics   Alcohol use: Yes    Comment: rarely alcohol use at special events   Past Medical History:  Diagnosis Date   Asthma    Cardiomyopathy ([place])  2016   a. 01/2014 Echo: EF 25-30%;  b. ? ischemic vs non-ischemic.  He's never had an ischemic eval.   Chronic combined systolic (congestive) and diastolic (congestive) heart failure ([place])    a. 01/2014 Echo: EF 25-30%. b. Echo 03/2015: Improved EF of 40-45%   COPD (chronic obstructive pulmonary disease) ([place])    Depression    Diabetes mellitus without complication ([place]) 0277   started med therapy approx 2018   Hyperlipidemia    Hypertensive cardiomyopathy ([place]) 06/30/2015   Hypertensive heart disease    a. Since his 21's.   Morbid obesity ([place])    Obstructive sleep apnea    a. Has not used CPAP since ~ 2012.   Past Surgical History:  Procedure Laterality Date   HERNIA REPAIR  2004   Family History  Problem Relation Age of Onset   Heart disease Mother        alive & well.   Heart attack Father 88       died @ 80 of cancer.   Diabetes Father    Cancer Father        "in his stomach"   Cancer Sister    Cancer Paternal Uncle        strong FH malignancy multiple relavies  Social History   Tobacco Use   Smoking status: Never   Smokeless tobacco: Never   Tobacco comments:    Quit in his 64's.  Substance Use Topics   Alcohol use: Yes    Comment: rarely alcohol use at special events   No Known Allergies  Prior to Admission medications   Medication Sig Start Date End Date Taking? Authorizing Provider  albuterol (VENTOLIN HFA) 108 (90 Base) MCG/ACT inhaler Inhale 2 puffs into the lungs every 6 (six) hours as needed for wheezing or shortness of breath. 11/09/20  Yes Regalado, Belkys A, MD  atorvastatin (LIPITOR) 10 MG tablet TAKE ONE TABLET BY MOUTH EVERY DAY 11/09/20 11/09/21 Yes Regalado, Belkys A, MD  Blood Glucose Monitoring Suppl (RIGHTEST GM550 BLOOD GLUCOSE) w/Device KIT AS DIRECTED 12/01/19 11/30/20 Yes   Blood Pressure Monitoring (BLOOD PRESSURE CUFF) MISC USE AS DIRECTED ONCE DAILY AT 12 NOON. 11/09/20  Yes Regalado, Belkys A, MD  carvedilol (COREG) 12.5 MG tablet  Take 1 tablet (12.5 mg total) by mouth 2 (two) times daily with a meal. 11/09/20  Yes Regalado, Belkys A, MD  cetirizine (ZYRTEC) 10 MG tablet TAKE ONE TABLET BY MOUTH EVERY DAY 11/09/20 11/09/21 Yes Regalado, Belkys A, MD  dapagliflozin propanediol (FARXIGA) 10 MG TABS tablet TAKE ONE TABLET BY MOUTH EVERY DAY BEFORE BREAKFAST 11/09/20 11/09/21 Yes Regalado, Belkys A, MD  fluticasone-salmeterol (ADVAIR DISKUS) 500-50 MCG/ACT AEPB Inhale 1 puff into the lungs in the morning and at bedtime. 11/09/20  Yes Regalado, Belkys A, MD  glucose blood test strip USE AS DIRECTED 12/01/19 11/30/20 Yes   hydrALAZINE (APRESOLINE) 25 MG tablet TAKE ONE TABLET BY MOUTH 2 TIMES A DAY 11/09/20 07/24/21 Yes Regalado, Belkys A, MD  insulin glargine (LANTUS) 100 UNIT/ML Solostar Pen Inject 28 Units into the skin once daily. 11/09/20 08/04/21 Yes Regalado, Belkys A, MD  Insulin Pen Needle 32G X 4 MM MISC Use as directed 06/20/20  Yes Aline Brochure, Keri K, RPH  ipratropium-albuterol (DUONEB) 0.5-2.5 (3) MG/3ML SOLN INHALE CONTENTS OF ONE VIAL (3ML) USING NEBULIZER ONCE EVERY 4 HOURS AS NEEDED 11/09/20 11/09/21 Yes Regalado, Belkys A, MD  losartan (COZAAR) 100 MG tablet TAKE ONE TABLET BY MOUTH EVERY DAY 11/09/20 11/09/21 Yes Regalado, Belkys A, MD  metFORMIN (GLUCOPHAGE) 500 MG tablet TAKE ONE TABLET BY MOUTH 2 TIMES A DAY WITH A MEAL. 11/09/20 11/09/21 Yes Regalado, Belkys A, MD  montelukast (SINGULAIR) 10 MG tablet TAKE ONE TABLET BY MOUTH AT BEDTIME 11/09/20 11/09/21 Yes Regalado, Belkys A, MD  pantoprazole (PROTONIX) 40 MG tablet Take 1 tablet (40 mg total) by mouth 2 (two) times daily. 11/09/20  Yes Regalado, Belkys A, MD  potassium chloride SA (KLOR-CON) 20 MEQ tablet TAKE ONE TABLET BY MOUTH 2 TIMES A DAY 11/09/20 11/09/21 Yes Regalado, Belkys A, MD  Rightest GL300 Lancets MISC AS DIRECTED 12/01/19 11/30/20 Yes   Semaglutide, 1 MG/DOSE, (OZEMPIC, 1 MG/DOSE,) 2 MG/1.5ML SOPN Inject 1 mg into the skin once a week. 07/26/20  01/22/21 Yes Iloabachie, Chioma E, NP  torsemide (DEMADEX) 20 MG tablet Take 2 tablets (40 mg total) by mouth once daily. May take additional 1 tablet (8m total) in the evening if needed. 07/26/20  Yes Hackney, TOtila KluverA, FNP  azithromycin (ZITHROMAX) 250 MG tablet Take 2 tablets (5025m by mouth once daily on Day 1. Then take 1 tablet (250 mg total) by mouth once daily thereafter. 11/09/20 11/14/20  Regalado, BeCassie FreerMD   Review of Systems  Constitutional:  Positive for fatigue. Negative for  appetite change.  HENT:  Positive for postnasal drip. Negative for congestion and sore throat.   Eyes: Negative.   Respiratory:  Positive for cough ("harsh at times"), shortness of breath (with little exertion) and wheezing. Negative for chest tightness.   Cardiovascular:  Negative for chest pain, palpitations and leg swelling.  Gastrointestinal:  Negative for abdominal distention and abdominal pain.  Endocrine: Negative.   Genitourinary: Negative.   Musculoskeletal:  Negative for back pain and neck pain.  Skin: Negative.   Allergic/Immunologic: Negative.   Neurological:  Negative for dizziness, weakness ("when coughing"), light-headedness and headaches.  Hematological:  Negative for adenopathy. Does not bruise/bleed easily.  Psychiatric/Behavioral:  Negative for dysphoric mood, sleep disturbance and suicidal ideas. The patient is not nervous/anxious.    Vitals:   02/13/21 0830  BP: 122/62  Pulse: (!) 102  Resp: 20  SpO2: 98%  Weight: 277 lb 6 oz (125.8 kg)  Height: 5' 9"  (1.753 m)   Wt Readings from Last 3 Encounters:  02/13/21 277 lb 6 oz (125.8 kg)  11/22/20 280 lb 6.4 oz (127.2 kg)  11/09/20 280 lb 4 oz (127.1 kg)   Lab Results  Component Value Date   CREATININE 1.70 (H) 12/29/2020   CREATININE 1.59 (H) 11/22/2020   CREATININE 1.62 (H) 11/01/2020   Physical Exam Vitals and nursing note reviewed.  Constitutional:      Appearance: He is well-developed.  HENT:     Head: Normocephalic  and atraumatic.  Neck:     Vascular: No JVD.  Cardiovascular:     Rate and Rhythm: Regular rhythm. Tachycardia present.  Pulmonary:     Effort: Pulmonary effort is normal. No tachypnea.     Breath sounds: Wheezing (expiratory throughout all lung fields) present. No rales.  Abdominal:     General: There is no distension.     Palpations: Abdomen is soft.     Tenderness: There is no abdominal tenderness.  Musculoskeletal:        General: No tenderness.     Cervical back: Neck supple.     Left knee: Normal.     Right lower leg: No edema.     Left lower leg: No edema.  Skin:    General: Skin is warm and dry.  Neurological:     Mental Status: He is alert and oriented to person, place, and time.  Psychiatric:        Behavior: Behavior normal.   Assessment & Plan:  1: Chronic heart failure with now preserved ejection fraction-  - NYHA Class II - euvolemic today - weighing daily and he was reminded to call for an overnight weight gain of >2 pounds or a weekly weight gain of >5 pounds - weight 277 today, down from 280 from last visit - trying to eat low sodium foods when he can but says it's difficult at times due to finances - PCP note says that an echo will be ordered, however has not happened yet - continues to have occasional cramping in hands and flanks when he stretches. Torsemide dose changed during last visit and he is tolerating that well, 40 mg QOD, 20 mg  QOD. Labs have not indicated source of cramping.   2: HTN- - BP 122/62 - [facility] - will have labs checked today by PCP in preparation for PCP visit on 02/21/21 - BMP on 12/29/20 reviewed and shows sodium  135, potassium 3.9, creatinine 1.70 and GFR 47  3: Diabetes- - checks his BS  daily, ranges between 101-120 - A1c on 12/02/20 was 8.4%  4: Asthma- - on advair and albuterol rescue inhaler - has persistent coughing spells associated with strong odors - supposed to f/u with pulmonology office however  issues with paperwork for assistance is still being completed and he has not made an appt yet   Patient did not bring his medications nor a list. Each medication was verbally reviewed with the patient and he was encouraged to bring the bottles to every visit to confirm accuracy of list.  Return in 6 months or sooner for any questions/problems before then.

## 2021-02-13 NOTE — Progress Notes (Unsigned)
BMP

## 2021-02-13 NOTE — Patient Instructions (Addendum)
The [facility] will be moving around the corner to [address] mid-February. Our phone number will remain the same.  Continue to weigh daily.  Call if you have a gain of 2lbs/night or 5 lbs/week.  Follow up with pulmonology.  You are doing great!!  Return in 6 months.

## 2021-02-14 ENCOUNTER — Other Ambulatory Visit: Payer: Self-pay

## 2021-02-14 ENCOUNTER — Ambulatory Visit: Payer: Self-pay | Admitting: Gerontology

## 2021-02-14 LAB — BASIC METABOLIC PANEL
BUN/Creatinine Ratio: 10 (ref 9–20)
BUN: 17 mg/dL (ref 6–24)
CO2: 20 mmol/L (ref 20–29)
Calcium: 9.9 mg/dL (ref 8.7–10.2)
Chloride: 101 mmol/L (ref 96–106)
Creatinine, Ser: 1.63 mg/dL — ABNORMAL HIGH (ref 0.76–1.27)
Glucose: 204 mg/dL — ABNORMAL HIGH (ref 70–99)
Potassium: 3.7 mmol/L (ref 3.5–5.2)
Sodium: 141 mmol/L (ref 134–144)
eGFR: 50 mL/min/{1.73_m2} — ABNORMAL LOW (ref >59)

## 2021-02-14 LAB — HEMOGLOBIN A1C
Est. average glucose Bld gHb Est-mCnc: 194 mg/dL
Hgb A1c MFr Bld: 8.4 % — ABNORMAL HIGH (ref 4.8–5.6)

## 2021-02-21 ENCOUNTER — Ambulatory Visit: Payer: Self-pay | Admitting: Gerontology

## 2021-02-21 ENCOUNTER — Other Ambulatory Visit: Payer: Self-pay

## 2021-02-21 ENCOUNTER — Encounter: Payer: Self-pay | Admitting: Gerontology

## 2021-02-21 VITALS — BP 104/68 | HR 93 | Temp 98.1°F | Resp 18 | Ht 69.0 in | Wt 281.0 lb

## 2021-02-21 DIAGNOSIS — E119 Type 2 diabetes mellitus without complications: Secondary | ICD-10-CM

## 2021-02-21 DIAGNOSIS — R7989 Other specified abnormal findings of blood chemistry: Secondary | ICD-10-CM

## 2021-02-21 DIAGNOSIS — R0789 Other chest pain: Secondary | ICD-10-CM

## 2021-02-21 DIAGNOSIS — Z794 Long term (current) use of insulin: Secondary | ICD-10-CM

## 2021-02-21 DIAGNOSIS — J449 Chronic obstructive pulmonary disease, unspecified: Secondary | ICD-10-CM

## 2021-02-21 DIAGNOSIS — E1165 Type 2 diabetes mellitus with hyperglycemia: Secondary | ICD-10-CM

## 2021-02-21 DIAGNOSIS — J45901 Unspecified asthma with (acute) exacerbation: Secondary | ICD-10-CM

## 2021-02-21 MED ORDER — PREDNISONE 10 MG PO TABS
10.0000 mg | ORAL_TABLET | Freq: Every day | ORAL | 0 refills | Status: DC
  Filled 2021-02-21: qty 5, 5d supply, fill #0

## 2021-02-21 MED ORDER — FLUTICASONE-SALMETEROL 500-50 MCG/ACT IN AEPB
INHALATION_SPRAY | RESPIRATORY_TRACT | 3 refills | Status: DC
  Filled 2021-02-21: qty 60, fill #0
  Filled 2021-05-11 – 2021-11-19 (×2): qty 180, 90d supply, fill #0

## 2021-02-21 MED ORDER — OZEMPIC (1 MG/DOSE) 4 MG/3ML ~~LOC~~ SOPN
1.0000 mg | PEN_INJECTOR | SUBCUTANEOUS | 3 refills | Status: DC
  Filled 2021-02-21 – 2021-05-14 (×2): qty 12, 112d supply, fill #0
  Filled 2021-05-28: qty 3, 28d supply, fill #0

## 2021-02-21 MED ORDER — INSULIN GLARGINE 100 UNIT/ML SOLOSTAR PEN
31.0000 [IU] | PEN_INJECTOR | Freq: Every day | SUBCUTANEOUS | 4 refills | Status: DC
  Filled 2021-02-21 – 2021-05-04 (×2): qty 15, 48d supply, fill #0
  Filled 2021-07-13: qty 30, 95d supply, fill #0
  Filled 2021-11-16: qty 30, 95d supply, fill #1
  Filled 2022-02-20: qty 15, 48d supply, fill #2

## 2021-02-21 NOTE — Patient Instructions (Signed)

## 2021-02-21 NOTE — Progress Notes (Signed)
Established Patient Office Visit  Subjective:  Patient ID: Bradley Mckenzie., male    DOB: [birth year]  Age: 56 y.o. MRN: [id number]  CC:  Chief Complaint  Patient presents with   Follow-up    Labs drawn 02/13/21    HPI [name].  is a 56 y.o. male who  has a past medical history of Asthma, Cardiomyopathy ([place]) (2016), Chronic combined systolic (congestive) and diastolic (congestive) heart failure ([place]), COPD (chronic obstructive pulmonary disease) ([place]), Depression, Diabetes mellitus without complication ([place]) (6314), Hyperlipidemia, Hypertensive cardiomyopathy ([place]) (06/30/2015), Hypertensive heart disease, Morbid obesity ([place]), and Obstructive sleep apnea presents for lab review.  He states that he is compliant with his medications, denies side effects and continues to make healthy lifestyle changes. His HgbA1c was 8.4%, he checks fasting reading daily, it was 110 mg/dl and it's usually less than 130 mg/dl.  He denies hypo and hyperglycemic symptoms, peripheral neuropathy and performs daily foot check. His Serum creatinine decreased from 1.70 mg to 1.63 mg/dl and eGFR was 50. He's on torsemide 40 mg qod and 20 mg qod.  Overall, he states that he is doing well and offers no further complaints.  Past Medical History:  Diagnosis Date   Asthma    Cardiomyopathy ([place]) 2016   a. 01/2014 Echo: EF 25-30%;  b. ? ischemic vs non-ischemic.  He's never had an ischemic eval.   Chronic combined systolic (congestive) and diastolic (congestive) heart failure ([place])    a. 01/2014 Echo: EF 25-30%. b. Echo 03/2015: Improved EF of 40-45%   COPD (chronic obstructive pulmonary disease) ([place])    Depression    Diabetes mellitus without complication ([place]) 9702   started med therapy approx 2018   Hyperlipidemia    Hypertensive cardiomyopathy ([place]) 06/30/2015   Hypertensive heart disease    a. Since his 23's.   Morbid obesity ([place])    Obstructive sleep apnea    a. Has not used CPAP since ~ 2012.     Past Surgical History:  Procedure Laterality Date   HERNIA REPAIR  2004    Family History  Problem Relation Age of Onset   Heart disease Mother        alive & well.   Heart attack Father 67       died @ 20 of cancer.   Diabetes Father    Cancer Father        "in his stomach"   Cancer Sister    Cancer Paternal Uncle        strong FH malignancy multiple relavies     Social History   Socioeconomic History   Marital status: Married    Spouse name: Not on file   Number of children: 2   Years of education: 12   Highest education level: GED or equivalent  Occupational History   Occupation: unemployed    Comment: hard time getting disability, denied medicaid   Tobacco Use   Smoking status: Never   Smokeless tobacco: Never   Tobacco comments:    Quit in his 62's.  Vaping Use   Vaping Use: Never used  Substance and Sexual Activity   Alcohol use: Yes    Comment: rarely alcohol use at special events   Drug use: Not Currently    Comment: Used marijuana in his teens.   Sexual activity: Not Currently  Other Topics Concern   Not on file  Social History Narrative   Lives in [place] with his wife. Wife no longer working; now on  disability. Have stable housing at group home that his wife used to work at. Sometimes struggles with transportation but not desperate. Wife has working car, his doesn't work. Had food stamps but cut off after 3 months. Wasn't eligible after that bc not able to work. Has been denied twice from disability. He was not able to obtain medicaid   Social Determinants of Health   Financial Resource Strain: Low Risk    Difficulty of Paying Living Expenses: Not hard at all  Food Insecurity: No Food Insecurity   Worried About Charity fundraiser in the Last Year: Never true   Arboriculturist in the Last Year: Never true  Transportation Needs: No Transportation Needs   Lack of Transportation (Medical): No   Lack of Transportation (Non-Medical): No   Physical Activity: Insufficiently Active   Days of Exercise per Week: 3 days   Minutes of Exercise per Session: 20 min  Stress: No Stress Concern Present   Feeling of Stress : Not at all  Social Connections: Moderately Isolated   Frequency of Communication with Friends and Family: Once a week   Frequency of Social Gatherings with Friends and Family: Once a week   Attends Religious Services: 1 to 4 times per year   Active Member of [employer] or Organizations: No   Attends Music therapist: Never   Marital Status: Married  Human resources officer Violence: Not At Risk   Fear of Current or Ex-Partner: No   Emotionally Abused: No   Physically Abused: No   Sexually Abused: No    Outpatient Medications Prior to Visit  Medication Sig Dispense Refill   albuterol (PROAIR HFA) 108 (90 Base) MCG/ACT inhaler Inhale 2 puffs into the lungs every 6 (six) hours as needed for wheezing or shortness of breath. 8.5 g 4   atorvastatin (LIPITOR) 10 MG tablet TAKE ONE TABLET BY MOUTH EVERY DAY 90 tablet 4   Blood Pressure Monitoring (BLOOD PRESSURE CUFF) MISC USE AS DIRECTED ONCE DAILY AT 12 NOON. 1 each 0   carvedilol (COREG) 12.5 MG tablet Take 1 tablet (12.5 mg total) by mouth 2 (two) times daily with a meal. 180 tablet 3   cetirizine (ZYRTEC) 10 MG tablet TAKE ONE TABLET BY MOUTH EVERY DAY 90 tablet 1   dapagliflozin propanediol (FARXIGA) 10 MG TABS tablet TAKE ONE TABLET BY MOUTH EVERY DAY BEFORE BREAKFAST 90 tablet 3   glucose blood (RIGHTEST GS550 BLOOD GLUCOSE) test strip USE AS DIRECTED. 50 strip 0   hydrALAZINE (APRESOLINE) 25 MG tablet TAKE ONE TABLET BY MOUTH 2 TIMES A DAY 180 tablet 1   Insulin Pen Needle 32G X 4 MM MISC Use as directed 100 each 11   ipratropium-albuterol (DUONEB) 0.5-2.5 (3) MG/3ML SOLN INHALE CONTENTS OF ONE VIAL (3ML) USING NEBULIZER ONCE EVERY 4 HOURS AS NEEDED 180 mL 1   losartan (COZAAR) 100 MG tablet TAKE ONE TABLET BY MOUTH EVERY DAY 90 tablet 4   metFORMIN  (GLUCOPHAGE) 500 MG tablet TAKE ONE TABLET BY MOUTH 2 TIMES A DAY WITH A MEAL. 180 tablet 1   montelukast (SINGULAIR) 10 MG tablet TAKE ONE TABLET BY MOUTH AT BEDTIME 30 tablet 3   potassium chloride SA (KLOR-CON M) 20 MEQ tablet TAKE ONE TABLET BY MOUTH 2 TIMES A DAY 180 tablet 4   Rightest GL300 Lancets MISC USE AS DIRECTED. 100 each 0   torsemide (DEMADEX) 20 MG tablet Take 2 tablets (40 mg total) by mouth once daily. May take additional 1  tablet (68m total) in the evening if needed. 270 tablet 3   fluticasone-salmeterol (ADVAIR DISKUS) 500-50 MCG/ACT AEPB Inhale 1 puff into the lungs in the morning and at bedtime. 60 each 3   insulin glargine (LANTUS) 100 UNIT/ML Solostar Pen Inject 28 Units into the skin once daily. 15 mL 4   Semaglutide, 1 MG/DOSE, (OZEMPIC, 1 MG/DOSE,) 4 MG/3ML SOPN Inject 1 mg into the skin once a week. 12 mL 0   Cholecalciferol 25 MCG (1000 UT) capsule Take 1 capsule (1,000 Units total) by mouth once daily. (Patient not taking: Reported on 02/13/2021) 30 capsule 3   No facility-administered medications prior to visit.    No Known Allergies  ROS Review of Systems  Constitutional: Negative.   Eyes: Negative.   Respiratory:  Positive for chest tightness and shortness of breath.   Cardiovascular: Negative.   Endocrine: Negative.   Skin: Negative.   Neurological: Negative.      Objective:    Physical Exam HENT:     Head: Normocephalic and atraumatic.  Cardiovascular:     Rate and Rhythm: Normal rate and regular rhythm.     Pulses: Normal pulses.     Heart sounds: Normal heart sounds.  Pulmonary:     Breath sounds: Examination of the right-upper field reveals wheezing. Examination of the left-upper field reveals wheezing. Examination of the right-middle field reveals wheezing. Examination of the left-middle field reveals wheezing. Examination of the right-lower field reveals wheezing. Examination of the left-lower field reveals wheezing. Wheezing present.   Skin:    General: Skin is warm.  Neurological:     General: No focal deficit present.     Mental Status: He is alert and oriented to person, place, and time. Mental status is at baseline.  Psychiatric:        Mood and Affect: Mood normal.        Behavior: Behavior normal.        Thought Content: Thought content normal.        Judgment: Judgment normal.    BP 104/68 (BP Location: Right Arm, Patient Position: Sitting, Cuff Size: Large)    Pulse 93    Temp 98.1 F (36.7 C) (Oral)    Resp 18    Ht _0  (1.753 m)    Wt 281 lb (127.5 kg)    SpO2 94%    BMI 41.50 kg/m  Wt Readings from Last 3 Encounters:  02/21/21 281 lb (127.5 kg)  02/13/21 282 lb 9.6 oz (128.2 kg)  02/13/21 277 lb 6 oz (125.8 kg)   Encourage weight loss  Health Maintenance Due  Topic Date Due   COVID-19 Vaccine (1) Never done   OPHTHALMOLOGY EXAM  Never done   HIV Screening  Never done   Hepatitis C Screening  Never done   TETANUS/TDAP  Never done   Zoster Vaccines- Shingrix (1 of 2) Never done   COLONOSCOPY (Pts 45-413yrInsurance coverage will need to be confirmed)  Never done   FOOT EXAM  11/29/2020    There are no preventive care reminders to display for this patient.  Lab Results  Component Value Date   TSH 1.140 11/22/2020   Lab Results  Component Value Date   WBC 9.9 12/29/2020   HGB 13.4 12/29/2020   HCT 42.4 12/29/2020   MCV 83.6 12/29/2020   PLT 279 12/29/2020   Lab Results  Component Value Date   NA 141 02/13/2021   K 3.7 02/13/2021   CO2 20 02/13/2021  GLUCOSE 204 (H) 02/13/2021   BUN 17 02/13/2021   CREATININE 1.63 (H) 02/13/2021   BILITOT <0.2 02/24/2020   ALKPHOS 104 02/24/2020   AST 18 02/24/2020   ALT 28 02/24/2020   PROT 8.5 02/24/2020   ALBUMIN 4.7 02/24/2020   CALCIUM 9.9 02/13/2021   ANIONGAP 8 12/29/2020   EGFR 50 (L) 02/13/2021   Lab Results  Component Value Date   CHOL 167 09/30/2019   Lab Results  Component Value Date   HDL 44 09/30/2019   Lab Results   Component Value Date   LDLCALC 88 09/30/2019   Lab Results  Component Value Date   TRIG 210 (H) 09/30/2019   Lab Results  Component Value Date   CHOLHDL 3.8 09/30/2019   Lab Results  Component Value Date   HGBA1C 8.4 (H) 02/13/2021      Assessment & Plan:   1. Diabetes mellitus without complication ([place]) -His hemoglobin A1c was 8.4%, and his goal should be less than 7%.  His glargine was increased to 31 units at bedtime, he was advised to check his blood glucose twice daily,record and bring log to follow up appointment.  He was advised to continue low/no concentrated sweet diet and exercise as tolerated. - Semaglutide, 1 MG/DOSE, (OZEMPIC, 1 MG/DOSE,) 4 MG/3ML SOPN; Inject 1 mg into the skin once a week.  Dispense: 12 mL; Refill: 3 - insulin glargine (LANTUS) 100 UNIT/ML Solostar Pen; Inject 31 Units into the skin once daily.  Dispense: 15 mL; Refill: 4 - HgB A1c; Future   2. Elevated serum creatinine -His serum creatinine was elevated, he was advised to monitor his fluid intake and we himself daily.  We will recheck BMP and if creatinine continues to increase will reach out to the CHF clinic. - Basic Metabolic Panel (BMET); Future  3. Chronic obstructive pulmonary disease, unspecified COPD type ([place]) -His breathing is stable and he will continue using his Advair. - fluticasone-salmeterol (ADVAIR DISKUS) 500-50 MCG/ACT AEPB; Inhale 1 puff into the lungs in the morning and at bedtime.  Dispense: 60 each; Refill: 3  4 Feeling of chest tightness -He has expiratory wheezes, he was advised to use his inhalers and prednisone 10 mg x 5 days.  He has no cough. - predniSONE (DELTASONE) 10 MG tablet; Take 1 tablet (10 mg total) by mouth once daily with breakfast.  Dispense: 5 tablet; Refill: 0     Follow-up: Return in about 12 weeks (around 05/16/2021), or if symptoms worsen or fail to improve.    Oaklan Persons Jerold Coombe, NP

## 2021-02-26 ENCOUNTER — Telehealth: Payer: Self-pay | Admitting: Pharmacist

## 2021-02-26 NOTE — Telephone Encounter (Signed)
02/26/2021 8:43:56 AM - Lantus (dose increase) forms to dr -- Arletha Pili - Monday, February 26, 2021 8:41 AM --  Received printout from pharmacy for Lantus 31mg  (dose increase) forms for provider to sign in [facility] folder.

## 2021-03-14 ENCOUNTER — Telehealth: Payer: Self-pay | Admitting: Pharmacist

## 2021-03-14 NOTE — Telephone Encounter (Signed)
03/14/2021 2:19:50 PM - Lantus faxed to [employer] ?-- Arletha Pili - Wednesday, March 14, 2021 2:18 PM -- ?Faxed Lantus 31mg  (Dose Increase) to [employer].  ?

## 2021-03-21 ENCOUNTER — Telehealth: Payer: Self-pay | Admitting: Pharmacist

## 2021-03-21 NOTE — Telephone Encounter (Signed)
03/21/2021 3:26:42 PM - [name] renewal forms to pr & dr ?-- Arletha Pili - Wednesday, March 21, 2021 3:22 PM --  ?Renewal forms for Farxiga 10mg  mailed to pt requesting POI & taxes. Also mailed to provider Darylene Price, FNP ? ?

## 2021-03-23 ENCOUNTER — Other Ambulatory Visit: Payer: Self-pay | Admitting: Gerontology

## 2021-03-23 MED FILL — Torsemide Tab 20 MG: ORAL | 90 days supply | Qty: 270 | Fill #3 | Status: CN

## 2021-03-26 ENCOUNTER — Other Ambulatory Visit: Payer: Self-pay | Admitting: Gerontology

## 2021-03-26 ENCOUNTER — Other Ambulatory Visit: Payer: Self-pay

## 2021-03-27 ENCOUNTER — Other Ambulatory Visit: Payer: Self-pay

## 2021-03-27 MED FILL — Glucose Blood Test Strip: 50 days supply | Qty: 50 | Fill #0 | Status: AC

## 2021-03-27 MED FILL — Torsemide Tab 20 MG: ORAL | 90 days supply | Qty: 270 | Fill #3 | Status: AC

## 2021-04-02 ENCOUNTER — Telehealth: Payer: Self-pay | Admitting: Pharmacist

## 2021-04-02 NOTE — Telephone Encounter (Signed)
04/02/2021 12:17:36 PM - [name] Pending ?-- Arletha Pili - Monday, April 02, 2021 12:14 PM --  ?Received provider sign portion for [place]. Holding for pt signed portion, POI & taxes which was requested on 03/21/21. ?

## 2021-04-13 ENCOUNTER — Other Ambulatory Visit: Payer: Self-pay

## 2021-04-17 ENCOUNTER — Other Ambulatory Visit: Payer: Self-pay

## 2021-05-07 ENCOUNTER — Other Ambulatory Visit: Payer: Self-pay

## 2021-05-09 ENCOUNTER — Other Ambulatory Visit: Payer: Self-pay

## 2021-05-09 DIAGNOSIS — R7989 Other specified abnormal findings of blood chemistry: Secondary | ICD-10-CM

## 2021-05-09 DIAGNOSIS — E119 Type 2 diabetes mellitus without complications: Secondary | ICD-10-CM

## 2021-05-10 ENCOUNTER — Other Ambulatory Visit: Payer: Self-pay

## 2021-05-10 LAB — BASIC METABOLIC PANEL
BUN/Creatinine Ratio: 9 (ref 9–20)
BUN: 14 mg/dL (ref 6–24)
CO2: 24 mmol/L (ref 20–29)
Calcium: 10 mg/dL (ref 8.7–10.2)
Chloride: 102 mmol/L (ref 96–106)
Creatinine, Ser: 1.58 mg/dL — ABNORMAL HIGH (ref 0.76–1.27)
Glucose: 147 mg/dL — ABNORMAL HIGH (ref 70–99)
Potassium: 4.2 mmol/L (ref 3.5–5.2)
Sodium: 141 mmol/L (ref 134–144)
eGFR: 52 mL/min/{1.73_m2} — ABNORMAL LOW (ref >59)

## 2021-05-10 LAB — HEMOGLOBIN A1C
Est. average glucose Bld gHb Est-mCnc: 194 mg/dL
Hgb A1c MFr Bld: 8.4 % — ABNORMAL HIGH (ref 4.8–5.6)

## 2021-05-11 ENCOUNTER — Other Ambulatory Visit: Payer: Self-pay

## 2021-05-14 ENCOUNTER — Other Ambulatory Visit: Payer: Self-pay

## 2021-05-15 ENCOUNTER — Other Ambulatory Visit: Payer: Self-pay

## 2021-05-16 ENCOUNTER — Ambulatory Visit: Payer: Self-pay | Admitting: Gerontology

## 2021-05-17 ENCOUNTER — Other Ambulatory Visit: Payer: Self-pay

## 2021-05-17 ENCOUNTER — Encounter: Payer: Self-pay | Admitting: Gerontology

## 2021-05-17 ENCOUNTER — Ambulatory Visit: Payer: Self-pay | Admitting: Gerontology

## 2021-05-17 VITALS — BP 129/79 | HR 97 | Temp 97.4°F | Resp 16 | Ht 69.0 in | Wt 281.4 lb

## 2021-05-17 DIAGNOSIS — J45909 Unspecified asthma, uncomplicated: Secondary | ICD-10-CM | POA: Insufficient documentation

## 2021-05-17 DIAGNOSIS — R42 Dizziness and giddiness: Secondary | ICD-10-CM

## 2021-05-17 DIAGNOSIS — E119 Type 2 diabetes mellitus without complications: Secondary | ICD-10-CM

## 2021-05-17 DIAGNOSIS — J45901 Unspecified asthma with (acute) exacerbation: Secondary | ICD-10-CM | POA: Insufficient documentation

## 2021-05-17 DIAGNOSIS — R059 Cough, unspecified: Secondary | ICD-10-CM

## 2021-05-17 LAB — GLUCOSE, POCT (MANUAL RESULT ENTRY): POC Glucose: 130 mg/dl — AB (ref 70–99)

## 2021-05-17 MED ORDER — BENZONATATE 100 MG PO CAPS
100.0000 mg | ORAL_CAPSULE | Freq: Three times a day (TID) | ORAL | 0 refills | Status: DC | PRN
  Filled 2021-05-17: qty 20, 7d supply, fill #0

## 2021-05-17 MED ORDER — BASAGLAR KWIKPEN 100 UNIT/ML ~~LOC~~ SOPN
31.0000 [IU] | PEN_INJECTOR | Freq: Every day | SUBCUTANEOUS | 0 refills | Status: DC
  Filled 2021-05-17: qty 15, 48d supply, fill #0

## 2021-05-17 MED ORDER — PREDNISONE 10 MG PO TABS
10.0000 mg | ORAL_TABLET | Freq: Every day | ORAL | 0 refills | Status: DC
  Filled 2021-05-17: qty 6, 6d supply, fill #0

## 2021-05-17 MED ORDER — AZITHROMYCIN 250 MG PO TABS
ORAL_TABLET | ORAL | 0 refills | Status: DC
  Filled 2021-05-17: qty 6, 5d supply, fill #0

## 2021-05-17 NOTE — Patient Instructions (Signed)

## 2021-05-17 NOTE — Progress Notes (Signed)
? ?Established Patient Office Visit ? ?Subjective   ?Patient ID: Bradley Mckenzie., male    DOB: [birth year]  Age: 56 y.o. MRN: [id number] ? ?Chief Complaint  ?Patient presents with  ? Follow-up  ?  Labs drawn 05/09/21  ? Diabetes  ?  Patient's fasting blood sugar was 194 today and 137 yesterday   ? Dizziness  ?  Patient c/o dizziness x 2-3 days  ? ? ?HPI ? ?Bradley Mckenzie.  is a 56 y.o. male who  has a past medical history of Asthma, Cardiomyopathy ([place]) (2016), Chronic combined systolic (congestive) and diastolic (congestive) heart failure ([place]), COPD (chronic obstructive pulmonary disease) ([place]), Depression, Diabetes mellitus without complication ([place]) (2423), Hyperlipidemia, Hypertensive cardiomyopathy ([place]) (06/30/2015), Hypertensive heart disease, Morbid obesity ([place]), and Obstructive sleep apnea presents for lab review.  He states that he is compliant with his medications, denies side effects and continues to make healthy lifestyle changes. At his last visit, his Lantus was increased to 31 units at bedtime His HgbA1c was 8.4%, he checks fasting reading daily,  his blood glucose was 194 mg/dl this morning.  He denies hypo and hyperglycemic symptoms, peripheral neuropathy and performs daily foot check. His Serum creatinine decreased from 1.63 mg/dl to 1.50 mg/dl and eGFR was 52. He's on torsemide 40 mg qod and 20 mg qod. Currently, he reports that he fell 2 days ago, after he stood up ,started coughing, was disoriented and fell on his knee and arm, he denies any injury. He reports that the room was spinning yesterday, and after drinking 3 bottles of water he felt better and the vertigo subsided. He states that his blood pressure was normal and blood glucose was 137 mg/dl when checked after the fall episode. Today he states that the vertigo is improving , but during triage at the clinic, he started coughing and felt some light headedness, but no syncope or fall. He admits to having productive cough that started  few weeks ago, with brownish colored phlegm.He states that coughing precipitates the dizzy spell. He states that the cough does not wake him up at night. He states that his symptoms are improving and he offers no further complaint. ? ? ? ? ? ?Review of Systems  ?Constitutional: Negative.   ?HENT:  Negative for ear pain and tinnitus.   ?Respiratory:  Positive for cough, shortness of breath and wheezing.   ?Cardiovascular:  Negative for chest pain and palpitations.  ?Neurological:  Positive for dizziness.  ?Psychiatric/Behavioral: Negative.    ? ?  ?Objective:  ?  ? ?BP 129/79 (BP Location: Right Arm, Patient Position: Standing, Cuff Size: Large)   Pulse 97   Temp (!) 97.4 ?F (36.3 ?C) (Oral)   Resp 16   Ht $R'5\' 9"'aW$  (1.753 m)   Wt 281 lb 6.4 oz (127.6 kg)   SpO2 92%   BMI 41.56 kg/m?  ?BP Readings from Last 3 Encounters:  ?05/17/21 129/79  ?05/09/21 123/77  ?02/21/21 104/68  ? ?Wt Readings from Last 3 Encounters:  ?05/17/21 281 lb 6.4 oz (127.6 kg)  ?05/09/21 278 lb 4.8 oz (126.2 kg)  ?02/21/21 281 lb (127.5 kg)  ? ?  ? ?Physical Exam ?HENT:  ?   Head: Normocephalic and atraumatic.  ?   Mouth/Throat:  ?   Mouth: Mucous membranes are moist.  ?Eyes:  ?   Extraocular Movements: Extraocular movements intact.  ?   Conjunctiva/sclera: Conjunctivae normal.  ?   Pupils: Pupils are equal, round, and reactive to light.  ?Cardiovascular:  ?  Rate and Rhythm: Normal rate and regular rhythm.  ?   Pulses: Normal pulses.  ?   Heart sounds: Normal heart sounds.  ?Pulmonary:  ?   Effort: Pulmonary effort is normal.  ?   Breath sounds: Examination of the right-upper field reveals wheezing. Examination of the left-upper field reveals wheezing. Examination of the right-middle field reveals wheezing. Examination of the left-middle field reveals wheezing. Examination of the right-lower field reveals wheezing. Examination of the left-lower field reveals wheezing. Wheezing present. No decreased breath sounds.  ?Skin: ?   General: Skin  is warm.  ?Neurological:  ?   General: No focal deficit present.  ?   Mental Status: He is alert and oriented to person, place, and time. Mental status is at baseline.  ?Psychiatric:     ?   Mood and Affect: Mood normal.     ?   Behavior: Behavior normal.     ?   Thought Content: Thought content normal.     ?   Judgment: Judgment normal.  ? ? ? ?Results for orders placed or performed in visit on 05/17/21  ?POCT Glucose (CBG)  ?Result Value Ref Range  ? POC Glucose 130 (A) 70 - 99 mg/dl  ? ? ?Last CBC ?Lab Results  ?Component Value Date  ? WBC 9.9 12/29/2020  ? HGB 13.4 12/29/2020  ? HCT 42.4 12/29/2020  ? MCV 83.6 12/29/2020  ? MCH 26.4 12/29/2020  ? RDW 16.0 (H) 12/29/2020  ? PLT 279 12/29/2020  ? ?Last metabolic panel ?Lab Results  ?Component Value Date  ? GLUCOSE 147 (H) 05/09/2021  ? NA 141 05/09/2021  ? K 4.2 05/09/2021  ? CL 102 05/09/2021  ? CO2 24 05/09/2021  ? BUN 14 05/09/2021  ? CREATININE 1.58 (H) 05/09/2021  ? EGFR 52 (L) 05/09/2021  ? CALCIUM 10.0 05/09/2021  ? PHOS 6.0 (H) 09/02/2015  ? PROT 8.5 02/24/2020  ? ALBUMIN 4.7 02/24/2020  ? LABGLOB 3.8 02/24/2020  ? AGRATIO 1.2 02/24/2020  ? BILITOT <0.2 02/24/2020  ? ALKPHOS 104 02/24/2020  ? AST 18 02/24/2020  ? ALT 28 02/24/2020  ? ANIONGAP 8 12/29/2020  ? ?Last lipids ?Lab Results  ?Component Value Date  ? CHOL 167 09/30/2019  ? HDL 44 09/30/2019  ? [place] 88 09/30/2019  ? TRIG 210 (H) 09/30/2019  ? CHOLHDL 3.8 09/30/2019  ? ?Last hemoglobin A1c ?Lab Results  ?Component Value Date  ? HGBA1C 8.4 (H) 05/09/2021  ? ?Last thyroid functions ?Lab Results  ?Component Value Date  ? TSH 1.140 11/22/2020  ? ?  ? ?The 10-year ASCVD risk score (Arnett DK, et al., 2019) is: 19.1% ? ?  ?Assessment & Plan:  ? ?1. Diabetes mellitus without complication ([place]) ?- His diabetes is not under control, he will continue on current medication, will follow up with [facility]. He was encouraged to continue on low carb/non concentrated sweet diet and exercise as  tolerated. ?- POCT Glucose (CBG); Future ?- POCT Glucose (CBG) ?- Insulin Glargine (BASAGLAR KWIKPEN) 100 UNIT/ML; Inject 31 Units into the skin once nightly at bedtime.  Dispense: 15 mL; Refill: 0 ? ?2. Cough, unspecified type ?- He was started on Benzonatate, educated on medication side effects and advised to notfiy clinic. ?- benzonatate (TESSALON PERLES) 100 MG capsule; Take 1 capsule (100 mg total) by mouth 3 (three) times daily as needed for cough.  Dispense: 20 capsule; Refill: 0 ? ?3. Dizziness ?- He was advised to be well hydrated, monitor his weight daily, change  position slowly and to go to the ED for worsening symptoms. ? ?4. Asthma with acute exacerbation, unspecified asthma severity, unspecified whether persistent ?- Possible Asthma flare, generalized wheezing, was started on 10 mg Prednisone daily x 6 days and Azithromycin due to his co morbidities. He was educated on medication side effects and advised to go to the ED for worsening symptoms. ?- azithromycin (ZITHROMAX) 250 MG tablet; TAKE 2 TABLETS ($RemoveBe'500MG'LpquywBQI$  TOTAL) BY MOUTH ONCE DAILY ON DAY 1. THEN TAKE 1 TABLET ($RemoveB'250MG'IglpCTTf$  TOTAL) BY MOUTH ONCE DAILY ON DAY 2-5.  Dispense: 6 each; Refill: 0 ?- predniSONE (DELTASONE) 10 MG tablet; Take 1 tablet (10 mg total) by mouth once daily with breakfast.  Dispense: 6 tablet; Refill: 0 ? ? ?Return in about 19 days (around 06/05/2021), or if symptoms worsen or fail to improve.  ? ? ?Narjis Mira Jerold Coombe, NP ? ?

## 2021-05-18 ENCOUNTER — Other Ambulatory Visit: Payer: Self-pay

## 2021-05-22 ENCOUNTER — Other Ambulatory Visit: Payer: Self-pay

## 2021-05-28 ENCOUNTER — Telehealth: Payer: Self-pay | Admitting: Pharmacist

## 2021-05-28 NOTE — Telephone Encounter (Signed)
05/28/2021 2:16:58 PM - Lantus dose increase refaxed to [employer] ?

## 2021-05-29 ENCOUNTER — Ambulatory Visit: Payer: Self-pay | Admitting: "Endocrinology

## 2021-05-29 ENCOUNTER — Other Ambulatory Visit: Payer: Self-pay

## 2021-05-29 VITALS — BP 133/82 | HR 102 | Temp 98.4°F | Wt 283.4 lb

## 2021-05-29 DIAGNOSIS — E1165 Type 2 diabetes mellitus with hyperglycemia: Secondary | ICD-10-CM

## 2021-05-29 MED ORDER — OZEMPIC (2 MG/DOSE) 8 MG/3ML ~~LOC~~ SOPN
2.0000 mg | PEN_INJECTOR | SUBCUTANEOUS | 11 refills | Status: DC
  Filled 2021-05-29: qty 9, fill #0

## 2021-05-29 NOTE — Progress Notes (Signed)
Follow up Diabetes/ Endocrine [facility]     Patient ID: Bradley Hamed., male   DOB: [birth year], 56 y.o.   MRN: [id number] Assessment/Plan  Saw Bradley Mckenzie. is a 56 y.o. male who is seen in follow up for No primary diagnosis found. at the request of Bradley Mckenzie, Chioma E, NP. His A1c has plateaued at 8.4 since November 2019, and he has been tolerating Ozempic well. To further decrease his A1c and improve his diabetes management, Ozempic will be increased form 1mg  to 2mg  per week along with continuing taking Farxiga, Lantus and Metformin. We will follow up in 3 months.     Patient Instructions  It was so nice to see you today!  Here are the key points from this visit: Increase the Ozempic to 2mg  per week Follow up in 3 months    No orders of the defined types were placed in this encounter.    Subjective:  Bradley Mckenzie is a 56 year old male with a PMH of cardiomyopathy, HF, HLD, Asthma, COPD and OSA presenting for a T2DM management follow up. He has last been seen at [facility] 02/29/20. His A1c has been at a similar level (8.4) between 11/22/20 and 4/26. He checks his blood sugar daily, once in the morning, and sees ranges from 108-120 on average, around 140 for the last few days and the highest recent reading was 189 mg/dl. He checks his blood pressure regularly, but does not record the value as he lost the value book. His saw his cardiologist in 02/13/21 and has a echo scheduled in August. He has been having spells of coughing where he has dizziness and passes out on the floor. In a previous note, he stated that his blood pressure was normal and his glucose was 137 mg/dl. For hyperglycemia symptoms, he has always been very thirsty person, he urinates frequently which he attributes to torsemide, no weight loss, infrequent numbness/tingling in his hands/feet and no changes in vision. For hypoglycemia symptoms, once in a while he gets jittery and then resolves after eating. He has not seen ophthalmology  recently.  For diabetes management medications, he has been tolerating Ozempic (1mg  per week) well, with decreases in appetite and no nausea or vomiting. He does have frequent loose stools. He is also taking dapagliflozin propanediol [name]) (10mg ), Metformin (500mg , twice a day) and Glargine (lantus) (31 units).  His diet consists of usually three meals, with sometimes skipping breakfast. He cut back on portions of meals, but he states he is not usually eating the right things, including [place] food. He is waiting on his [facility] application to see a nutritionist. He gets winded very quickly and it is hard to exercise. He does not use alcohol, does not smoke and does not use other drugs.    Review of Systems  Respiratory:  Positive for shortness of breath.   Cardiovascular:  Negative for chest pain.   Decklin Gae Gallop.  has a past medical history of Asthma, Cardiomyopathy ([place]) (2016), Chronic combined systolic (congestive) and diastolic (congestive) heart failure ([place]), COPD (chronic obstructive pulmonary disease) ([place]), Depression, Diabetes mellitus without complication ([place]) (4174), Hyperlipidemia, Hypertensive cardiomyopathy ([place]) (06/30/2015), Hypertensive heart disease, Morbid obesity ([place]), and Obstructive sleep apnea.  Family History, Social History, current Medications and allergies reviewed and updated in Epic.  Objective:    Vitals: Weight: 283.4;bs BP: 133/82 HR: 102 SPO2: 93% Temp: 98.23F  Physical Exam Constitutional:      Appearance: Normal appearance.  Cardiovascular:  Rate and Rhythm: Normal rate and regular rhythm.     Pulses:          Radial pulses are 2+ on the right side and 2+ on the left side.     Heart sounds: Normal heart sounds, S1 normal and S2 normal.  Pulmonary:     Effort: Pulmonary effort is normal.     Breath sounds: Normal breath sounds.  Musculoskeletal:     Right lower leg: No edema.     Left lower leg: No edema.  Feet:     Comments:  Intact sensation in bilateral feet to a 10g monofilament needle Neurological:     Mental Status: He is alert.    Data : I have personally reviewed pertinent labs and imaging studies, if indicated,  with the patient in clinic today.   Lab Orders  No laboratory test(s) ordered today    HC Readings from Last 3 Encounters:  No data found for [facility]    Wt Readings from Last 3 Encounters:  05/17/21 281 lb 6.4 oz (127.6 kg)  05/09/21 278 lb 4.8 oz (126.2 kg)  02/21/21 281 lb (127.5 kg)

## 2021-05-29 NOTE — Patient Instructions (Addendum)
It was so nice to see you today! ? ?Here are the key points from this visit: ?Increase the Ozempic to 2mg  per week ?Make sure to take [name] ?Cut back on sodas ?Follow up in 3 months  ?

## 2021-05-30 ENCOUNTER — Other Ambulatory Visit: Payer: Self-pay

## 2021-05-31 ENCOUNTER — Telehealth: Payer: Self-pay | Admitting: Pharmacy Technician

## 2021-05-31 NOTE — Telephone Encounter (Signed)
Received updated proof of income.  Patient eligible to receive medication assistance at [facility] until time for re-certification in 2024, and as long as eligibility requirements continue to be met.  Lilo [place]ington J. Carolena Fairbank Care Manager [facility]  

## 2021-06-05 ENCOUNTER — Ambulatory Visit: Payer: Self-pay | Admitting: Gerontology

## 2021-06-05 ENCOUNTER — Other Ambulatory Visit: Payer: Self-pay | Admitting: Gerontology

## 2021-06-05 ENCOUNTER — Other Ambulatory Visit: Payer: Self-pay

## 2021-06-05 VITALS — BP 112/72 | HR 98 | Resp 17 | Wt 283.1 lb

## 2021-06-05 DIAGNOSIS — E118 Type 2 diabetes mellitus with unspecified complications: Secondary | ICD-10-CM

## 2021-06-05 DIAGNOSIS — Z0000 Encounter for general adult medical examination without abnormal findings: Secondary | ICD-10-CM

## 2021-06-05 DIAGNOSIS — R42 Dizziness and giddiness: Secondary | ICD-10-CM

## 2021-06-05 MED ORDER — OZEMPIC (1 MG/DOSE) 4 MG/3ML ~~LOC~~ SOPN
2.0000 mg | PEN_INJECTOR | SUBCUTANEOUS | 11 refills | Status: DC
  Filled 2021-06-05 – 2021-06-29 (×4): qty 6, 28d supply, fill #0
  Filled 2021-08-02: qty 24, 112d supply, fill #1

## 2021-06-05 NOTE — Patient Instructions (Signed)

## 2021-06-05 NOTE — Progress Notes (Signed)
Established Patient Office Visit  Subjective   Patient ID: Bradley Mckenzie., male    DOB: April 30, 1965  Age: 56 y.o. MRN: [id number]  No chief complaint on file.   HPI  Bradley Mckenzie.  is a 56 y.o. male who  has a past medical history of Asthma, Cardiomyopathy ([place]) (2016), Chronic combined systolic (congestive) and diastolic (congestive) heart failure ([place]), COPD (chronic obstructive pulmonary disease) ([place]), Depression, Diabetes mellitus without complication ([place]) (5465), Hyperlipidemia, Hypertensive cardiomyopathy ([address]) (06/30/2015), Hypertensive heart disease, Morbid obesity ([place]), and Obstructive sleep apnea presents for follow up visit. He states that he is compliant with his medications, denies side effects and continues to make healthy lifestyle changes During his last visit on 05/17/21, he was seen for dizziness, productive cough, generalized wheezing and chest tightness. He was  prophylactically treated with Azithromycin, prednisone and benzonatate for possible Asthma flare. Currently, he reports that his dizziness has improved and cough subsided, denies chest tightness and breathing is stable. He was seen by the [facility] Endocr[facility]ology team on 05/29/21 for his diabetes. His HgbA1c was 8.4%, his Ozempic was increased to 2 mg weekly, and to continue on Farxiga, Metformin and Basaglar insulin. Overall, he states that he's doing well and offers no further complaint.    Review of Systems  Constitutional: Negative.   Eyes: Negative.   Respiratory: Negative.    Cardiovascular: Negative.   Neurological: Negative.   Psychiatric/Behavioral: Negative.       Objective:     BP 112/72 (BP Location: Right Arm, Patient Position: Sitting, Cuff Size: Large)   Pulse 98   Resp 17   Wt 283 lb 1.6 oz (128.4 kg)   SpO2 93%   BMI 41.81 kg/m  BP Readings from Last 3 Encounters:  06/05/21 112/72  05/29/21 133/82  05/17/21 129/79   Wt Readings from Last 3 Encounters:  06/05/21 283 lb 1.6 oz (128.4  kg)  05/29/21 283 lb 6.4 oz (128.5 kg)  05/17/21 281 lb 6.4 oz (127.6 kg)      Physical Exam HENT:     Head: Normocephalic and atraumatic.     Mouth/Throat:     Mouth: Mucous membranes are moist.  Eyes:     Extraocular Movements: Extraocular movements intact.     Conjunctiva/sclera: Conjunctivae normal.     Pupils: Pupils are equal, round, and reactive to light.  Cardiovascular:     Rate and Rhythm: Normal rate and regular rhythm.     Pulses: Normal pulses.     Heart sounds: Normal heart sounds.  Pulmonary:     Effort: Pulmonary effort is normal.     Breath sounds: Normal breath sounds.  Skin:    General: Skin is warm.  Neurological:     General: No focal deficit present.     Mental Status: He is alert and oriented to person, place, and time. Mental status is at baseline.  Psychiatric:        Mood and Affect: Mood normal.        Behavior: Behavior normal.        Thought Content: Thought content normal.        Judgment: Judgment normal.     No results found for any visits on 06/05/21.  Last CBC Lab Results  Component Value Date   WBC 9.9 12/29/2020   HGB 13.4 12/29/2020   HCT 42.4 12/29/2020   MCV 83.6 12/29/2020   MCH 26.4 12/29/2020   RDW 16.0 (H) 12/29/2020   PLT 279 12/29/2020  Last metabolic panel Lab Results  Component Value Date   GLUCOSE 147 (H) 05/09/2021   NA 141 05/09/2021   K 4.2 05/09/2021   CL 102 05/09/2021   CO2 24 05/09/2021   BUN 14 05/09/2021   CREATININE 1.58 (H) 05/09/2021   EGFR 52 (L) 05/09/2021   CALCIUM 10.0 05/09/2021   PHOS 6.0 (H) 09/02/2015   PROT 8.5 02/24/2020   ALBUMIN 4.7 02/24/2020   LABGLOB 3.8 02/24/2020   AGRATIO 1.2 02/24/2020   BILITOT <0.2 02/24/2020   ALKPHOS 104 02/24/2020   AST 18 02/24/2020   ALT 28 02/24/2020   ANIONGAP 8 12/29/2020   Last lipids Lab Results  Component Value Date   CHOL 167 09/30/2019   HDL 44 09/30/2019   LDLCALC 88 09/30/2019   TRIG 210 (H) 09/30/2019   CHOLHDL 3.8 09/30/2019    Last hemoglobin A1c Lab Results  Component Value Date   HGBA1C 8.4 (H) 05/09/2021      The 10-year ASCVD risk score (Arnett DK, et al., 2019) is: 14.9%    Assessment & Plan:   1. Type 2 diabetes mellitus with complication (HCC) - His HgbA1c was 8.4%, has not started 2 mg Ozempic and Pharmacy was notified. He was encouraged to continue on low carb/non concentrated sweet diet and exercise as tolerated. - HgB A1c; Future  2. Dizziness - Resolved. He was advised to notify clinic for recurring symptoms.  3. Health care maintenance - Routine labs will be checked. - Lipid panel; Future - Basic Metabolic Panel (BMET); Future - Vitamin D (25 hydroxy); Future   Return in about 2 months (around 08/16/2021), or if symptoms worsen or fail to improve.    Bradley Beem Jerold Coombe, NP

## 2021-06-21 ENCOUNTER — Other Ambulatory Visit: Payer: Self-pay

## 2021-06-25 ENCOUNTER — Other Ambulatory Visit: Payer: Self-pay

## 2021-06-26 ENCOUNTER — Other Ambulatory Visit: Payer: Self-pay

## 2021-06-27 ENCOUNTER — Other Ambulatory Visit: Payer: Self-pay

## 2021-06-28 ENCOUNTER — Other Ambulatory Visit: Payer: Self-pay

## 2021-06-29 ENCOUNTER — Other Ambulatory Visit: Payer: Self-pay

## 2021-07-03 ENCOUNTER — Other Ambulatory Visit: Payer: Self-pay

## 2021-07-10 ENCOUNTER — Other Ambulatory Visit: Payer: Self-pay

## 2021-07-10 ENCOUNTER — Other Ambulatory Visit: Payer: Self-pay | Admitting: Gerontology

## 2021-07-10 DIAGNOSIS — J301 Allergic rhinitis due to pollen: Secondary | ICD-10-CM

## 2021-07-10 DIAGNOSIS — J449 Chronic obstructive pulmonary disease, unspecified: Secondary | ICD-10-CM

## 2021-07-10 DIAGNOSIS — J45901 Unspecified asthma with (acute) exacerbation: Secondary | ICD-10-CM

## 2021-07-10 MED FILL — Ipratropium-Albuterol Nebu Soln 0.5-2.5(3) MG/3ML: RESPIRATORY_TRACT | 5 days supply | Qty: 90 | Fill #0 | Status: AC

## 2021-07-10 MED FILL — Montelukast Sodium Tab 10 MG (Base Equiv): ORAL | 30 days supply | Qty: 30 | Fill #0 | Status: AC

## 2021-07-12 ENCOUNTER — Other Ambulatory Visit: Payer: Self-pay

## 2021-07-13 ENCOUNTER — Other Ambulatory Visit: Payer: Self-pay

## 2021-07-18 ENCOUNTER — Other Ambulatory Visit: Payer: Self-pay

## 2021-07-18 ENCOUNTER — Other Ambulatory Visit: Payer: Self-pay | Admitting: Gerontology

## 2021-07-18 MED ORDER — INSULIN PEN NEEDLE 32G X 4 MM MISC
11 refills | Status: AC
  Filled 2021-07-18: qty 100, 90d supply, fill #0
  Filled 2021-11-19: qty 100, 90d supply, fill #1

## 2021-07-19 ENCOUNTER — Other Ambulatory Visit: Payer: Self-pay

## 2021-07-20 ENCOUNTER — Other Ambulatory Visit: Payer: Self-pay

## 2021-07-23 ENCOUNTER — Other Ambulatory Visit: Payer: Self-pay

## 2021-07-24 ENCOUNTER — Other Ambulatory Visit: Payer: Self-pay | Admitting: Gerontology

## 2021-07-24 ENCOUNTER — Other Ambulatory Visit: Payer: Self-pay

## 2021-07-24 MED FILL — Cetirizine HCl Tab 10 MG: ORAL | 90 days supply | Qty: 90 | Fill #0 | Status: AC

## 2021-08-02 ENCOUNTER — Other Ambulatory Visit: Payer: Self-pay

## 2021-08-05 ENCOUNTER — Other Ambulatory Visit: Payer: Self-pay | Admitting: Gerontology

## 2021-08-05 ENCOUNTER — Other Ambulatory Visit: Payer: Self-pay

## 2021-08-05 MED FILL — Montelukast Sodium Tab 10 MG (Base Equiv): ORAL | 30 days supply | Qty: 30 | Fill #0 | Status: CN

## 2021-08-05 MED FILL — Ipratropium-Albuterol Nebu Soln 0.5-2.5(3) MG/3ML: RESPIRATORY_TRACT | 5 days supply | Qty: 90 | Fill #0 | Status: CN

## 2021-08-06 ENCOUNTER — Other Ambulatory Visit: Payer: Self-pay

## 2021-08-06 ENCOUNTER — Other Ambulatory Visit: Payer: Self-pay | Admitting: Gerontology

## 2021-08-06 DIAGNOSIS — I10 Essential (primary) hypertension: Secondary | ICD-10-CM

## 2021-08-06 DIAGNOSIS — E1165 Type 2 diabetes mellitus with hyperglycemia: Secondary | ICD-10-CM

## 2021-08-06 MED FILL — Ipratropium-Albuterol Nebu Soln 0.5-2.5(3) MG/3ML: RESPIRATORY_TRACT | 5 days supply | Qty: 90 | Fill #0 | Status: AC

## 2021-08-07 ENCOUNTER — Other Ambulatory Visit: Payer: Self-pay

## 2021-08-07 MED FILL — Metformin HCl Tab 500 MG: ORAL | 90 days supply | Qty: 180 | Fill #0 | Status: AC

## 2021-08-07 MED FILL — Hydralazine HCl Tab 25 MG: ORAL | 90 days supply | Qty: 180 | Fill #0 | Status: AC

## 2021-08-08 ENCOUNTER — Other Ambulatory Visit: Payer: Self-pay

## 2021-08-08 DIAGNOSIS — E118 Type 2 diabetes mellitus with unspecified complications: Secondary | ICD-10-CM

## 2021-08-08 DIAGNOSIS — Z0000 Encounter for general adult medical examination without abnormal findings: Secondary | ICD-10-CM

## 2021-08-08 MED FILL — Montelukast Sodium Tab 10 MG (Base Equiv): ORAL | 90 days supply | Qty: 90 | Fill #0 | Status: AC

## 2021-08-09 LAB — VITAMIN D 25 HYDROXY (VIT D DEFICIENCY, FRACTURES): Vit D, 25-Hydroxy: 12.8 ng/mL — ABNORMAL LOW (ref 30.0–100.0)

## 2021-08-09 LAB — LIPID PANEL
Chol/HDL Ratio: 3.5 ratio (ref 0.0–5.0)
Cholesterol, Total: 161 mg/dL (ref 100–199)
HDL: 46 mg/dL (ref >39)
LDL Chol Calc (NIH): 99 mg/dL (ref 0–99)
Triglycerides: 87 mg/dL (ref 0–149)
VLDL Cholesterol Cal: 16 mg/dL (ref 5–40)

## 2021-08-09 LAB — HEMOGLOBIN A1C
Est. average glucose Bld gHb Est-mCnc: 180 mg/dL
Hgb A1c MFr Bld: 7.9 % — ABNORMAL HIGH (ref 4.8–5.6)

## 2021-08-09 LAB — BASIC METABOLIC PANEL
BUN/Creatinine Ratio: 8 — ABNORMAL LOW (ref 9–20)
BUN: 12 mg/dL (ref 6–24)
CO2: 20 mmol/L (ref 20–29)
Calcium: 9.5 mg/dL (ref 8.7–10.2)
Chloride: 102 mmol/L (ref 96–106)
Creatinine, Ser: 1.47 mg/dL — ABNORMAL HIGH (ref 0.76–1.27)
Glucose: 166 mg/dL — ABNORMAL HIGH (ref 70–99)
Potassium: 4.1 mmol/L (ref 3.5–5.2)
Sodium: 141 mmol/L (ref 134–144)
eGFR: 56 mL/min/{1.73_m2} — ABNORMAL LOW (ref >59)

## 2021-08-13 ENCOUNTER — Other Ambulatory Visit: Payer: Self-pay

## 2021-08-14 ENCOUNTER — Ambulatory Visit: Payer: Self-pay | Admitting: Gerontology

## 2021-08-14 ENCOUNTER — Ambulatory Visit: Payer: Self-pay | Attending: Family | Admitting: Family

## 2021-08-14 ENCOUNTER — Encounter: Payer: Self-pay | Admitting: Family

## 2021-08-14 ENCOUNTER — Other Ambulatory Visit: Payer: Self-pay

## 2021-08-14 ENCOUNTER — Encounter: Payer: Self-pay | Admitting: Gerontology

## 2021-08-14 VITALS — BP 132/81 | HR 106 | Temp 97.4°F | Resp 18 | Ht 69.0 in | Wt 277.7 lb

## 2021-08-14 VITALS — BP 137/84 | HR 106 | Resp 18 | Ht 69.0 in | Wt 280.0 lb

## 2021-08-14 DIAGNOSIS — Z794 Long term (current) use of insulin: Secondary | ICD-10-CM

## 2021-08-14 DIAGNOSIS — E559 Vitamin D deficiency, unspecified: Secondary | ICD-10-CM

## 2021-08-14 DIAGNOSIS — I110 Hypertensive heart disease with heart failure: Secondary | ICD-10-CM | POA: Insufficient documentation

## 2021-08-14 DIAGNOSIS — I340 Nonrheumatic mitral (valve) insufficiency: Secondary | ICD-10-CM | POA: Insufficient documentation

## 2021-08-14 DIAGNOSIS — E785 Hyperlipidemia, unspecified: Secondary | ICD-10-CM | POA: Insufficient documentation

## 2021-08-14 DIAGNOSIS — E118 Type 2 diabetes mellitus with unspecified complications: Secondary | ICD-10-CM

## 2021-08-14 DIAGNOSIS — E119 Type 2 diabetes mellitus without complications: Secondary | ICD-10-CM | POA: Insufficient documentation

## 2021-08-14 DIAGNOSIS — I5032 Chronic diastolic (congestive) heart failure: Secondary | ICD-10-CM | POA: Insufficient documentation

## 2021-08-14 DIAGNOSIS — Z79899 Other long term (current) drug therapy: Secondary | ICD-10-CM | POA: Insufficient documentation

## 2021-08-14 DIAGNOSIS — R0602 Shortness of breath: Secondary | ICD-10-CM | POA: Insufficient documentation

## 2021-08-14 DIAGNOSIS — J45909 Unspecified asthma, uncomplicated: Secondary | ICD-10-CM | POA: Insufficient documentation

## 2021-08-14 DIAGNOSIS — G4733 Obstructive sleep apnea (adult) (pediatric): Secondary | ICD-10-CM | POA: Insufficient documentation

## 2021-08-14 DIAGNOSIS — R7989 Other specified abnormal findings of blood chemistry: Secondary | ICD-10-CM

## 2021-08-14 DIAGNOSIS — I10 Essential (primary) hypertension: Secondary | ICD-10-CM

## 2021-08-14 DIAGNOSIS — Z7951 Long term (current) use of inhaled steroids: Secondary | ICD-10-CM | POA: Insufficient documentation

## 2021-08-14 MED ORDER — OZEMPIC (2 MG/DOSE) 8 MG/3ML ~~LOC~~ SOPN
PEN_INJECTOR | SUBCUTANEOUS | 3 refills | Status: DC
  Filled 2021-11-10: qty 12, 112d supply, fill #0
  Filled 2021-11-15: qty 12, 12d supply, fill #0
  Filled 2022-02-13: qty 12, 112d supply, fill #1
  Filled 2022-04-08 – 2022-06-21 (×3): qty 3, 28d supply, fill #2
  Filled 2022-07-24: qty 3, 28d supply, fill #3

## 2021-08-14 MED ORDER — VITAMIN D (ERGOCALCIFEROL) 1.25 MG (50000 UNIT) PO CAPS
50000.0000 [IU] | ORAL_CAPSULE | ORAL | 0 refills | Status: DC
  Filled 2021-08-14: qty 12, 84d supply, fill #0

## 2021-08-14 NOTE — Progress Notes (Signed)
 Established Patient Office Visit  Subjective   Patient ID: Bradley Connell Jr., male    DOB: 02/08/1965  Age: 55 y.o. MRN: [id number]  Chief Complaint  Patient presents with   Follow-up    Labs drawn 08/08/21   Diabetes    HPI  Bradley Runde Jr.  is a 56 y.o. male who  has a past medical history of Asthma, Cardiomyopathy (HCC) (2016), Chronic combined systolic (congestive) and diastolic (congestive) heart failure (HCC), COPD (chronic obstructive pulmonary disease) (H[facility]), Depression, Diabetes mellitus without complication ([facility]) (2018), Hyperlipidemia, Hypertensive cardiomyopathy ([facility]) (06/30/2015), Hypertensive heart disease, Morbid obesity (HCC), and Obstructive sleep apnea presents for follow up visit and lab review. His HgbA1c checked on 08/08/21 decreased from 8.4% to 7.9%. He states that he checks his fasting blood glucose daily and it was 130 mg/dl this morning. He denies hypo/hyperglycemic symptoms, peripheral neuropathy and performs daily foot checks. He is compliant with his medications, denies side effects and continues to make healthy lifestyle changes. His Serum creatinine was 1.47 mg/dl, eGFR was 56. His Vitamin D was 12.8 ng/ml and he admits to experiencing intermittent fatigue. He states that he vomited this morning after a coughing spell, denies aggravating factor. He reports intermittent shortness of breath with walking more than 7 blocks and using albuterol inhaler relieves symptoms. He was seen at the Heart failure clinic on 08/14/21 by Hackney Tina FNP. His heart rate was 106 bpm, denies chest pain, palpitation and light headedness. Overall, he states that he's doing well and offers no further complaint.  Review of Systems  Constitutional: Negative.   Respiratory:  Positive for shortness of breath.   Cardiovascular: Negative.   Neurological: Negative.   Endo/Heme/Allergies: Negative.   Psychiatric/Behavioral: Negative.        Objective:     BP 132/81 (BP Location: Right  Arm, Patient Position: Sitting, Cuff Size: Large)   Pulse (!) 106   Temp (!) 97.4 F (36.3 C) (Oral)   Resp 18   Ht 5' 9" (1.753 m)   Wt 277 lb 11.2 oz (126 kg)   SpO2 91%   BMI 41.01 kg/m  BP Readings from Last 3 Encounters:  08/14/21 132/81  08/14/21 137/84  08/08/21 120/76   Wt Readings from Last 3 Encounters:  08/14/21 277 lb 11.2 oz (126 kg)  08/14/21 280 lb (127 kg)  08/08/21 278 lb 9.6 oz (126.4 kg)    Encouraged to continue his weight loss regimen  Physical Exam HENT:     Head: Normocephalic and atraumatic.     Mouth/Throat:     Mouth: Mucous membranes are moist.  Eyes:     Extraocular Movements: Extraocular movements intact.     Conjunctiva/sclera: Conjunctivae normal.     Pupils: Pupils are equal, round, and reactive to light.  Cardiovascular:     Rate and Rhythm: Tachycardia present.  Pulmonary:     Effort: Pulmonary effort is normal.     Breath sounds: Normal breath sounds.  Skin:    General: Skin is warm.  Neurological:     General: No focal deficit present.     Mental Status: He is alert and oriented to person, place, and time. Mental status is at baseline.  Psychiatric:        Mood and Affect: Mood normal.        Behavior: Behavior normal.        Thought Content: Thought content normal.        Judgment: Judgment normal.        No results found for any visits on 08/14/21.  Last CBC Lab Results  Component Value Date   WBC 9.9 12/29/2020   HGB 13.4 12/29/2020   HCT 42.4 12/29/2020   MCV 83.6 12/29/2020   MCH 26.4 12/29/2020   RDW 16.0 (H) 12/29/2020   PLT 279 19/50/9326   Last metabolic panel Lab Results  Component Value Date   GLUCOSE 166 (H) 08/08/2021   NA 141 08/08/2021   K 4.1 08/08/2021   CL 102 08/08/2021   CO2 20 08/08/2021   BUN 12 08/08/2021   CREATININE 1.47 (H) 08/08/2021   EGFR 56 (L) 08/08/2021   CALCIUM 9.5 08/08/2021   PHOS 6.0 (H) 09/02/2015   PROT 8.5 02/24/2020   ALBUMIN 4.7 02/24/2020   LABGLOB 3.8 02/24/2020    AGRATIO 1.2 02/24/2020   BILITOT <0.2 02/24/2020   ALKPHOS 104 02/24/2020   AST 18 02/24/2020   ALT 28 02/24/2020   ANIONGAP 8 12/29/2020   Last lipids Lab Results  Component Value Date   CHOL 161 08/08/2021   HDL 46 08/08/2021   LDLCALC 99 08/08/2021   TRIG 87 08/08/2021   CHOLHDL 3.5 08/08/2021   Last hemoglobin A1c Lab Results  Component Value Date   HGBA1C 7.9 (H) 08/08/2021   Last vitamin D Lab Results  Component Value Date   VD25OH 12.8 (L) 08/08/2021      The 10-year ASCVD risk score (Arnett DK, et al., 2019) is: 19.4%    Assessment & Plan:   1. Type 2 diabetes mellitus with complication (HCC) -His diabetes is improving, his goal HgbA1c should be less than 7%. He will continue on current medication, low carb/non concentrated sweet diet and exercise as tolerated.  2. Elevated serum creatinine - He was encouraged to check weight daily and his serum creatinine is improving. He is on diuretics for his CHF regimen, was advised to adhere to DASH diet and fluid intake. Will recheck in 3 months. He was advised to monitor heart rate and go to the ED for worsening symptoms.  3. Vitamin D deficiency - He was started on Ergocalciferol 1.25 mg weekly, educated on medication side effects and advised to notify clinic. - Vitamin D, Ergocalciferol, (DRISDOL) 1.25 MG (50000 UNIT) CAPS capsule; Take 1 capsule (50,000 Units total) by mouth every 7 (seven) days.  Dispense: 12 capsule; Refill: 0   Return in about 3 months (around 11/14/2021), or if symptoms worsen or fail to improve.    Tayia Stonesifer Jerold Coombe, NP

## 2021-08-14 NOTE — Patient Instructions (Signed)
DASH Eating Plan DASH stands for Dietary Approaches to Stop Hypertension. The DASH eating plan is a healthy eating plan that has been shown to: Reduce high blood pressure (hypertension). Reduce your risk for type 2 diabetes, heart disease, and stroke. Help with weight loss. What are tips for following this plan? Reading food labels Check food labels for the amount of salt (sodium) per serving. Choose foods with less than 5 percent of the Daily Value of sodium. Generally, foods with less than 300 milligrams (mg) of sodium per serving fit into this eating plan. To find whole grains, look for the word "whole" as the first word in the ingredient list. Shopping Buy products labeled as "low-sodium" or "no salt added." Buy fresh foods. Avoid canned foods and pre-made or frozen meals. Cooking Avoid adding salt when cooking. Use salt-free seasonings or herbs instead of table salt or sea salt. Check with your health care provider or pharmacist before using salt substitutes. Do not fry foods. Cook foods using healthy methods such as baking, boiling, grilling, roasting, and broiling instead. Cook with heart-healthy oils, such as olive, canola, avocado, soybean, or sunflower oil. Meal planning  Eat a balanced diet that includes: 4 or more servings of fruits and 4 or more servings of vegetables each day. Try to fill one-half of your plate with fruits and vegetables. 6-8 servings of whole grains each day. Less than 6 oz (170 g) of lean meat, poultry, or fish each day. A 3-oz (85-g) serving of meat is about the same size as a deck of cards. One egg equals 1 oz (28 g). 2-3 servings of low-fat dairy each day. One serving is 1 cup (237 mL). 1 serving of nuts, seeds, or beans 5 times each week. 2-3 servings of heart-healthy fats. Healthy fats called omega-3 fatty acids are found in foods such as walnuts, flaxseeds, fortified milks, and eggs. These fats are also found in cold-water fish, such as sardines, salmon,  and mackerel. Limit how much you eat of: Canned or prepackaged foods. Food that is high in trans fat, such as some fried foods. Food that is high in saturated fat, such as fatty meat. Desserts and other sweets, sugary drinks, and other foods with added sugar. Full-fat dairy products. Do not salt foods before eating. Do not eat more than 4 egg yolks a week. Try to eat at least 2 vegetarian meals a week. Eat more home-cooked food and less restaurant, buffet, and fast food. Lifestyle When eating at a restaurant, ask that your food be prepared with less salt or no salt, if possible. If you drink alcohol: Limit how much you use to: 0-1 drink a day for women who are not pregnant. 0-2 drinks a day for men. Be aware of how much alcohol is in your drink. In the [place] one drink equals one 12 oz bottle of beer (355 mL), one 5 oz glass of wine (148 mL), or one 1 oz glass of hard liquor (44 mL). General information Avoid eating more than 2,300 mg of salt a day. If you have hypertension, you may need to reduce your sodium intake to 1,500 mg a day. Work with your health care provider to maintain a healthy body weight or to lose weight. Ask what an ideal weight is for you. Get at least 30 minutes of exercise that causes your heart to beat faster (aerobic exercise) most days of the week. Activities may include walking, swimming, or biking. Work with your health care provider or dietitian to   adjust your eating plan to your individual calorie needs. What foods should I eat? Fruits All fresh, dried, or frozen fruit. Canned fruit in natural juice (without added sugar). Vegetables Fresh or frozen vegetables (raw, steamed, roasted, or grilled). Low-sodium or reduced-sodium tomato and vegetable juice. Low-sodium or reduced-sodium tomato sauce and tomato paste. Low-sodium or reduced-sodium canned vegetables. Grains Whole-grain or whole-wheat bread. Whole-grain or whole-wheat pasta. Brown rice. Oatmeal. Quinoa.  Bulgur. Whole-grain and low-sodium cereals. Pita bread. Low-fat, low-sodium crackers. Whole-wheat flour tortillas. Meats and other proteins Skinless chicken or turkey. Ground chicken or turkey. Pork with fat trimmed off. Fish and seafood. Egg whites. Dried beans, peas, or lentils. Unsalted nuts, nut butters, and seeds. Unsalted canned beans. Lean cuts of beef with fat trimmed off. Low-sodium, lean precooked or cured meat, such as sausages or meat loaves. Dairy Low-fat (1%) or fat-free (skim) milk. Reduced-fat, low-fat, or fat-free cheeses. Nonfat, low-sodium ricotta or cottage cheese. Low-fat or nonfat yogurt. Low-fat, low-sodium cheese. Fats and oils Soft margarine without trans fats. Vegetable oil. Reduced-fat, low-fat, or light mayonnaise and salad dressings (reduced-sodium). Canola, safflower, olive, avocado, soybean, and sunflower oils. Avocado. Seasonings and condiments Herbs. Spices. Seasoning mixes without salt. Other foods Unsalted popcorn and pretzels. Fat-free sweets. The items listed above may not be a complete list of foods and beverages you can eat. Contact a dietitian for more information. What foods should I avoid? Fruits Canned fruit in a light or heavy syrup. Fried fruit. Fruit in cream or butter sauce. Vegetables Creamed or fried vegetables. Vegetables in a cheese sauce. Regular canned vegetables (not low-sodium or reduced-sodium). Regular canned tomato sauce and paste (not low-sodium or reduced-sodium). Regular tomato and vegetable juice (not low-sodium or reduced-sodium). Pickles. Olives. Grains Baked goods made with fat, such as croissants, muffins, or some breads. Dry pasta or rice meal packs. Meats and other proteins Fatty cuts of meat. Ribs. Fried meat. Bacon. Bologna, salami, and other precooked or cured meats, such as sausages or meat loaves. Fat from the back of a pig (fatback). Bratwurst. Salted nuts and seeds. Canned beans with added salt. Canned or smoked fish.  Whole eggs or egg yolks. Chicken or turkey with skin. Dairy Whole or 2% milk, cream, and half-and-half. Whole or full-fat cream cheese. Whole-fat or sweetened yogurt. Full-fat cheese. Nondairy creamers. Whipped toppings. Processed cheese and cheese spreads. Fats and oils Butter. Stick margarine. Lard. Shortening. Ghee. Bacon fat. Tropical oils, such as coconut, palm kernel, or palm oil. Seasonings and condiments Onion salt, garlic salt, seasoned salt, table salt, and sea salt. Worcestershire sauce. Tartar sauce. Barbecue sauce. Teriyaki sauce. Soy sauce, including reduced-sodium. Steak sauce. Canned and packaged gravies. Fish sauce. Oyster sauce. Cocktail sauce. Store-bought horseradish. Ketchup. Mustard. Meat flavorings and tenderizers. Bouillon cubes. Hot sauces. Pre-made or packaged marinades. Pre-made or packaged taco seasonings. Relishes. Regular salad dressings. Other foods Salted popcorn and pretzels. The items listed above may not be a complete list of foods and beverages you should avoid. Contact a dietitian for more information. Where to find more information National Heart, Lung, and Blood Institute: www.nhlbi.nih.gov American Heart Association: www.heart.org Academy of Nutrition and Dietetics: www.eatright.org National Kidney Foundation: www.kidney.org Summary The DASH eating plan is a healthy eating plan that has been shown to reduce high blood pressure (hypertension). It may also reduce your risk for type 2 diabetes, heart disease, and stroke. When on the DASH eating plan, aim to eat more fresh fruits and vegetables, whole grains, lean proteins, low-fat dairy, and heart-healthy fats. With the DASH   eating plan, you should limit salt (sodium) intake to 2,300 mg a day. If you have hypertension, you may need to reduce your sodium intake to 1,500 mg a day. Work with your health care provider or dietitian to adjust your eating plan to your individual calorie needs. This information is not  intended to replace advice given to you by your health care provider. Make sure you discuss any questions you have with your health care provider. Document Revised: 12/04/2018 Document Reviewed: 12/04/2018 Elsevier Patient Education  2023 Elsevier Inc. Carbohydrate Counting for Diabetes Mellitus, Adult Carbohydrate counting is a method of keeping track of how many carbohydrates you eat. Eating carbohydrates increases the amount of sugar (glucose) in the blood. Counting how many carbohydrates you eat improves how well you manage your blood glucose. This, in turn, helps you manage your diabetes. Carbohydrates are measured in grams (g) per serving. It is important to know how many carbohydrates (in grams or by serving size) you can have in each meal. This is different for every person. A dietitian can help you make a meal plan and calculate how many carbohydrates you should have at each meal and snack. What foods contain carbohydrates? Carbohydrates are found in the following foods: Grains, such as breads and cereals. Dried beans and soy products. Starchy vegetables, such as potatoes, peas, and corn. Fruit and fruit juices. Milk and yogurt. Sweets and snack foods, such as cake, cookies, candy, chips, and soft drinks. How do I count carbohydrates in foods? There are two ways to count carbohydrates in food. You can read food labels or learn standard serving sizes of foods. You can use either of these methods or a combination of both. Using the Nutrition Facts label The Nutrition Facts list is included on the labels of almost all packaged foods and beverages in the [place]. It includes: The serving size. Information about nutrients in each serving, including the grams of carbohydrate per serving. To use the Nutrition Facts, decide how many servings you will have. Then, multiply the number of servings by the number of carbohydrates per serving. The resulting number is the total grams of  carbohydrates that you will be having. Learning the standard serving sizes of foods When you eat carbohydrate foods that are not packaged or do not include Nutrition Facts on the label, you need to measure the servings in order to count the grams of carbohydrates. Measure the foods that you will eat with a food scale or measuring cup, if needed. Decide how many standard-size servings you will eat. Multiply the number of servings by 15. For foods that contain carbohydrates, one serving equals 15 g of carbohydrates. For example, if you eat 2 cups or 10 oz (300 g) of strawberries, you will have eaten 2 servings and 30 g of carbohydrates (2 servings x 15 g = 30 g). For foods that have more than one food mixed, such as soups and casseroles, you must count the carbohydrates in each food that is included. The following list contains standard serving sizes of common carbohydrate-rich foods. Each of these servings has about 15 g of carbohydrates: 1 slice of bread. 1 six-inch (15 cm) tortilla. ? cup or 2 oz (53 g) cooked rice or pasta.  cup or 3 oz (85 g) cooked or canned, drained and rinsed beans or lentils.  cup or 3 oz (85 g) starchy vegetable, such as peas, corn, or squash.  cup or 4 oz (120 g) hot cereal.  cup or 3 oz (85   g) boiled or mashed potatoes, or  or 3 oz (85 g) of a large baked potato.  cup or 4 fl oz (118 mL) fruit juice. 1 cup or 8 fl oz (237 mL) milk. 1 small or 4 oz (106 g) apple.  or 2 oz (63 g) of a medium banana. 1 cup or 5 oz (150 g) strawberries. 3 cups or 1 oz (28.3 g) popped popcorn. What is an example of carbohydrate counting? To calculate the grams of carbohydrates in this sample meal, follow the steps shown below. Sample meal 3 oz (85 g) chicken breast. ? cup or 4 oz (106 g) brown rice.  cup or 3 oz (85 g) corn. 1 cup or 8 fl oz (237 mL) milk. 1 cup or 5 oz (150 g) strawberries with sugar-free whipped topping. Carbohydrate calculation Identify the foods that  contain carbohydrates: Rice. Corn. Milk. Strawberries. Calculate how many servings you have of each food: 2 servings rice. 1 serving corn. 1 serving milk. 1 serving strawberries. Multiply each number of servings by 15 g: 2 servings rice x 15 g = 30 g. 1 serving corn x 15 g = 15 g. 1 serving milk x 15 g = 15 g. 1 serving strawberries x 15 g = 15 g. Add together all of the amounts to find the total grams of carbohydrates eaten: 30 g + 15 g + 15 g + 15 g = 75 g of carbohydrates total. What are tips for following this plan? Shopping Develop a meal plan and then make a shopping list. Buy fresh and frozen vegetables, fresh and frozen fruit, dairy, eggs, beans, lentils, and whole grains. Look at food labels. Choose foods that have more fiber and less sugar. Avoid processed foods and foods with added sugars. Meal planning Aim to have the same number of grams of carbohydrates at each meal and for each snack time. Plan to have regular, balanced meals and snacks. Where to find more information American Diabetes Association: diabetes.org Centers for Disease Control and Prevention: cdc.gov Academy of Nutrition and Dietetics: eatright.org Association of Diabetes Care & Education Specialists: diabeteseducator.org Summary Carbohydrate counting is a method of keeping track of how many carbohydrates you eat. Eating carbohydrates increases the amount of sugar (glucose) in your blood. Counting how many carbohydrates you eat improves how well you manage your blood glucose. This helps you manage your diabetes. A dietitian can help you make a meal plan and calculate how many carbohydrates you should have at each meal and snack. This information is not intended to replace advice given to you by your health care provider. Make sure you discuss any questions you have with your health care provider. Document Revised: 08/04/2019 Document Reviewed: 08/04/2019 Elsevier Patient Education  2023 Elsevier  Inc.  

## 2021-08-14 NOTE — Patient Instructions (Addendum)
Continue weighing daily and call for an overnight weight gain of 3 pounds or more or a weekly weight gain of more than 5 pounds.   If you have voicemail, please make sure your mailbox is cleaned out so that we may leave a message and please make sure to listen to any voicemails.    I will send a message to the social worker in [place] and she will reach out to you.

## 2021-08-14 NOTE — Progress Notes (Signed)
Patient ID: Bradley Mckenzie., male    DOB: [birth year], 56 y.o.   MRN: [id number]  HPI  Bradley Mckenzie is a 56 y/o male who has a history of diabetes, HTN, obstructive sleep apnea, depression, hyperlipidemia, morbid obesity and heart failure with a reduced ejection fraction. Remote tobacco exposure.   Echo report from 10/28/2018 reviewed and showed an EF of 60-65% along with mildly elevated PA pressure. Echo done 09/02/15 with an EF of 30-35%, mild mitral regurgitation and no aortic stenosis. EF has declined from 40-45% in March 2017 but he had been inconsistent taking his medications & not using his CPAP during this time.  Has not been admitted or been in the ED in the last 6 months.    Returns today for a follow-up visit with a chief complaint of moderate shortness of breath with minimal exertion. Describes this as chronic. Has associated fatigue, cough, wheezing, palpitations, light-headedness and weakness along with this. He denies any difficulty sleeping, abdominal distention, pedal edema, chest pain or weight gain.   Did eat 3 hot dogs with mustard and onions last night. Drinking koolaid made with equal.   Has not gotten [facility] Assistance yet and is in the process of trying to get disability.   He has not taken any of his medications yet today but has them with him.   Past Surgical History:  Procedure Laterality Date   HERNIA REPAIR  2004   Family History  Problem Relation Age of Onset   Heart disease Mother        alive & well.   Heart attack Father 62       died @ 41 of cancer.   Diabetes Father    Cancer Father        "in his stomach"   Cancer Sister    Cancer Paternal Uncle        strong FH malignancy multiple relavies    Social History   Tobacco Use   Smoking status: Never   Smokeless tobacco: Never   Tobacco comments:    Quit in his 15's.  Substance Use Topics   Alcohol use: Not Currently    Comment: rarely alcohol use at special events   Past Medical History:   Diagnosis Date   Asthma    Cardiomyopathy ([place]) 2016   a. 01/2014 Echo: EF 25-30%;  b. ? ischemic vs non-ischemic.  He's never had an ischemic eval.   Chronic combined systolic (congestive) and diastolic (congestive) heart failure ([place])    a. 01/2014 Echo: EF 25-30%. b. Echo 03/2015: Improved EF of 40-45%   COPD (chronic obstructive pulmonary disease) ([place])    Depression    Diabetes mellitus without complication ([place]) 4008   started med therapy approx 2018   Hyperlipidemia    Hypertensive cardiomyopathy ([place]) 06/30/2015   Hypertensive heart disease    a. Since his 74's.   Morbid obesity ([place])    Obstructive sleep apnea    a. Has not used CPAP since ~ 2012.   Past Surgical History:  Procedure Laterality Date   HERNIA REPAIR  2004   Family History  Problem Relation Age of Onset   Heart disease Mother        alive & well.   Heart attack Father 65       died @ 96 of cancer.   Diabetes Father    Cancer Father        "in his stomach"   Cancer Sister    Cancer Paternal Uncle  strong FH malignancy multiple relavies    Social History   Tobacco Use   Smoking status: Never   Smokeless tobacco: Never   Tobacco comments:    Quit in his 20's.  Substance Use Topics   Alcohol use: Not Currently    Comment: rarely alcohol use at special events   No Known Allergies  Prior to Admission medications   Medication Sig Start Date End Date Taking? Authorizing Provider  albuterol (PROAIR HFA) 108 (90 Base) MCG/ACT inhaler Inhale 2 puffs into the lungs every 6 (six) hours as needed for wheezing or shortness of breath. 02/08/21  Yes Iloabachie, Chioma E, NP  atorvastatin (LIPITOR) 10 MG tablet TAKE ONE TABLET BY MOUTH EVERY DAY 11/09/20 11/09/21 Yes Regalado, Belkys A, MD  Blood Pressure Monitoring (BLOOD PRESSURE CUFF) MISC USE AS DIRECTED ONCE DAILY AT 12 NOON. 11/09/20  Yes Regalado, Belkys A, MD  carvedilol (COREG) 12.5 MG tablet Take 1 tablet (12.5 mg total) by mouth 2 (two) times  daily with a meal. 11/09/20  Yes Regalado, Belkys A, MD  cetirizine (ZYRTEC) 10 MG tablet TAKE ONE TABLET BY MOUTH EVERY DAY 07/24/21 07/24/22 Yes Iloabachie, Chioma E, NP  dapagliflozin propanediol (FARXIGA) 10 MG TABS tablet TAKE ONE TABLET BY MOUTH EVERY DAY BEFORE BREAKFAST 01/30/21 01/30/22 Yes Preslea Rhodus A, FNP  fluticasone-salmeterol (ADVAIR DISKUS) 500-50 MCG/ACT AEPB Inhale 1 puff into the lungs in the morning and at bedtime. 02/21/21  Yes Iloabachie, Chioma E, NP  glucose blood (RIGHTEST GS550 BLOOD GLUCOSE) test strip USE AS DIRECTED. 03/27/21  Yes Iloabachie, Chioma E, NP  hydrALAZINE (APRESOLINE) 25 MG tablet TAKE ONE TABLET BY MOUTH 2 TIMES A DAY 08/07/21 04/21/22 Yes Iloabachie, Chioma E, NP  insulin glargine (LANTUS) 100 UNIT/ML Solostar Pen Inject 31 Units into the skin once daily. 02/21/21 11/16/21 Yes Iloabachie, Chioma E, NP  Insulin Pen Needle 32G X 4 MM MISC Use as directed 07/18/21  Yes Iloabachie, Chioma E, NP  ipratropium-albuterol (DUONEB) 0.5-2.5 (3) MG/3ML SOLN INHALE CONTENTS OF ONE VIAL (3ML) USING NEBULIZER ONCE EVERY 4 HOURS AS NEEDED 07/10/21 07/10/22 Yes Iloabachie, Chioma E, NP  losartan (COZAAR) 100 MG tablet TAKE ONE TABLET BY MOUTH EVERY DAY 11/09/20 11/09/21 Yes Regalado, Belkys A, MD  metFORMIN (GLUCOPHAGE) 500 MG tablet TAKE ONE TABLET BY MOUTH 2 TIMES A DAY WITH A MEAL. 08/07/21 08/07/22 Yes Iloabachie, Chioma E, NP  montelukast (SINGULAIR) 10 MG tablet TAKE ONE TABLET BY MOUTH AT BEDTIME 07/10/21 07/10/22 Yes Iloabachie, Chioma E, NP  potassium chloride SA (KLOR-CON M) 20 MEQ tablet TAKE ONE TABLET BY MOUTH 2 TIMES A DAY 11/09/20 11/09/21 Yes Regalado, Belkys A, MD  Rightest GL300 Lancets MISC USE AS DIRECTED. 02/13/21  Yes Iloabachie, Chioma E, NP  Semaglutide, 1 MG/DOSE, (OZEMPIC, 1 MG/DOSE,) 4 MG/3ML SOPN Inject 2 mg (2 x 1 mg injections) into the skin once a week. 06/05/21  Yes Iloabachie, Chioma E, NP  torsemide (DEMADEX) 20 MG tablet Take 2 tablets (40 mg total) by mouth  once daily. May take additional 1 tablet (20mg  total) in the evening if needed. 07/26/20  Yes Alisa Graff, FNP    Review of Systems  Constitutional:  Positive for fatigue. Negative for appetite change.  HENT:  Positive for postnasal drip. Negative for congestion and sore throat.   Eyes: Negative.   Respiratory:  Positive for cough ("harsh at times"), shortness of breath (with little exertion) and wheezing. Negative for chest tightness.   Cardiovascular:  Positive for palpitations (sometimes). Negative for  chest pain and leg swelling.  Gastrointestinal:  Negative for abdominal distention and abdominal pain.  Endocrine: Negative.   Genitourinary: Negative.   Musculoskeletal:  Negative for back pain and neck pain.  Skin: Negative.   Allergic/Immunologic: Negative.   Neurological:  Positive for weakness ("when coughing") and light-headedness (when coughing). Negative for dizziness and headaches.  Hematological:  Negative for adenopathy. Does not bruise/bleed easily.  Psychiatric/Behavioral:  Negative for dysphoric mood and sleep disturbance. The patient is not nervous/anxious.    Vitals:   08/14/21 0831  BP: 137/84  Pulse: (!) 106  Resp: 18  SpO2: 97%  Weight: 280 lb (127 kg)  Height: 5\' 9"  (1.753 m)   Wt Readings from Last 3 Encounters:  08/14/21 280 lb (127 kg)  08/08/21 278 lb 9.6 oz (126.4 kg)  06/05/21 283 lb 1.6 oz (128.4 kg)   Lab Results  Component Value Date   CREATININE 1.47 (H) 08/08/2021   CREATININE 1.58 (H) 05/09/2021   CREATININE 1.63 (H) 02/13/2021   Physical Exam Vitals and nursing note reviewed.  Constitutional:      Appearance: He is well-developed.  HENT:     Head: Normocephalic and atraumatic.  Neck:     Vascular: No JVD.  Cardiovascular:     Rate and Rhythm: Regular rhythm. Tachycardia present.  Pulmonary:     Effort: Pulmonary effort is normal. No tachypnea.     Breath sounds: Wheezing (expiratory throughout all lung fields) present. No rales.   Abdominal:     General: There is no distension.     Palpations: Abdomen is soft.     Tenderness: There is no abdominal tenderness.  Musculoskeletal:        General: No tenderness.     Cervical back: Neck supple.     Left knee: Normal.     Right lower leg: No edema.     Left lower leg: No edema.  Skin:    General: Skin is warm and dry.  Neurological:     Mental Status: He is alert and oriented to person, place, and time.  Psychiatric:        Behavior: Behavior normal.    Assessment & Plan:  1: Chronic heart failure with now preserved ejection fraction-  - NYHA Class II - euvolemic today - weighing daily and he was reminded to call for an overnight weight gain of >2 pounds or a weekly weight gain of >5 pounds - weight up 3 pounds from last visit here 6 months ago - trying to eat low sodium foods when he can but says it's difficult at times due to finances; did eat 3 hot dogs with mustard/ onions last night - PCP note says that an echo will be ordered but is waiting on getting approval for [facility]; he says that he needs to send in some information on his wife but he's not sure what that is. Will reach out to the social worker in [facility] for assistance   2: HTN- - BP mildly elevated (137/84) but he hasn't taken any of his medications yet this morning - saw PCP at [facility] on 06/05/21; returns later today - BMP on 08/08/21 reviewed and shows sodium 141, potassium 4.1, creatinine 1.47 and GFR 56  3: Diabetes- - checks his BS daily and was 130 - A1c on 08/08/21 was 7.9%  4: Asthma- - on advair and albuterol rescue inhaler - has persistent coughing spells  - instructed to use his nebulizer upon return home  today   Patient did not bring his medications nor a list. Each medication was verbally reviewed with the patient and he was encouraged to bring the bottles to every visit to confirm accuracy of list  Return in 6 months, sooner if needed.

## 2021-08-17 ENCOUNTER — Telehealth: Payer: Self-pay | Admitting: Licensed Clinical Social Worker

## 2021-08-17 NOTE — Telephone Encounter (Signed)
CSW referred to assist patient with navigating the [employer]  program. CSW contacted patient and left message for return call. Raquel Sarna, [place], [place]

## 2021-08-20 ENCOUNTER — Telehealth: Payer: Self-pay | Admitting: Licensed Clinical Social Worker

## 2021-08-20 NOTE — Telephone Encounter (Signed)
CSW attempted to contact patient to assist with [employer] application and navigating the process. CSW left message for return call. Raquel Sarna, [place], [place]

## 2021-08-24 ENCOUNTER — Other Ambulatory Visit: Payer: Self-pay

## 2021-08-28 ENCOUNTER — Ambulatory Visit: Payer: Self-pay

## 2021-08-30 ENCOUNTER — Other Ambulatory Visit: Payer: Self-pay

## 2021-09-04 ENCOUNTER — Ambulatory Visit: Payer: Self-pay

## 2021-09-06 ENCOUNTER — Ambulatory Visit: Payer: Self-pay | Admitting: Gerontology

## 2021-09-06 VITALS — BP 135/84 | HR 92 | Temp 98.2°F | Ht 69.0 in | Wt 280.8 lb

## 2021-09-06 DIAGNOSIS — R062 Wheezing: Secondary | ICD-10-CM | POA: Insufficient documentation

## 2021-09-06 DIAGNOSIS — J301 Allergic rhinitis due to pollen: Secondary | ICD-10-CM

## 2021-09-06 DIAGNOSIS — E559 Vitamin D deficiency, unspecified: Secondary | ICD-10-CM

## 2021-09-06 DIAGNOSIS — R059 Cough, unspecified: Secondary | ICD-10-CM

## 2021-09-06 MED ORDER — PREDNISONE 10 MG PO TABS
10.0000 mg | ORAL_TABLET | Freq: Every day | ORAL | 0 refills | Status: DC
  Filled 2021-09-06: qty 5, 5d supply, fill #0

## 2021-09-06 MED ORDER — MONTELUKAST SODIUM 10 MG PO TABS
ORAL_TABLET | Freq: Every day | ORAL | 3 refills | Status: DC
  Filled 2021-09-06: qty 30, fill #0
  Filled 2021-11-19: qty 30, 30d supply, fill #0
  Filled 2022-02-13: qty 30, 30d supply, fill #1

## 2021-09-06 MED ORDER — VITAMIN D (ERGOCALCIFEROL) 1.25 MG (50000 UNIT) PO CAPS
50000.0000 [IU] | ORAL_CAPSULE | ORAL | 0 refills | Status: DC
  Filled 2021-09-06: qty 12, 84d supply, fill #0

## 2021-09-06 MED ORDER — BENZONATATE 100 MG PO CAPS
100.0000 mg | ORAL_CAPSULE | Freq: Three times a day (TID) | ORAL | 0 refills | Status: DC | PRN
  Filled 2021-09-06: qty 20, 7d supply, fill #0

## 2021-09-06 NOTE — Patient Instructions (Signed)

## 2021-09-06 NOTE — Progress Notes (Signed)
Established Patient Office Visit  Subjective   Patient ID: Bradley Mckenzie., male    DOB: Jan 25, 1965  Age: 56 y.o. MRN: [id number]  Chief Complaint  Patient presents with   Shortness of Breath    Patient is here for shortness of breath,   Cough    For one week. Exacerbated with movement and certain odors. Said she has had dizziness and 3 episodes of syncope following a coughing episode.    HPI Bradley Mckenzie.  is a 56 y.o. male who  has a past medical history of Asthma, Cardiomyopathy ([place]) (2016), Chronic combined systolic (congestive) and diastolic (congestive) heart failure ([place]), COPD (chronic obstructive pulmonary disease) ([place]), Depression, Diabetes mellitus without complication ([place]) (9563), Hyperlipidemia, Hypertensive cardiomyopathy ([place]) (06/30/2015), Hypertensive heart disease, Morbid obesity ([place]), and Obstructive sleep apnea presents for medication refill, c/o productive cough, that has been going on for more than 4 weeks, with throat irritation, wheezing and shortness of breath.  He states that he has done multiple breathing treatment  without relief. He states that cough wakes him up at night. He denies fever, chills and sore throat. Overall, he states that he's doing well and offers no further complaint.   Review of Systems  Constitutional: Negative.   Respiratory:  Positive for cough, shortness of breath and wheezing.   Cardiovascular: Negative.   Neurological: Negative.   Psychiatric/Behavioral: Negative.        Objective:     BP 135/84 (BP Location: Right Arm, Patient Position: Sitting, Cuff Size: Large)   Pulse 92   Temp 98.2 F (36.8 C) (Oral)   Ht $R'5\' 9"'bA$  (1.753 m)   Wt 280 lb 12.8 oz (127.4 kg)   SpO2 95%   BMI 41.47 kg/m  BP Readings from Last 3 Encounters:  09/06/21 135/84  08/14/21 132/81  08/14/21 137/84   Wt Readings from Last 3 Encounters:  09/06/21 280 lb 12.8 oz (127.4 kg)  08/14/21 277 lb 11.2 oz (126 kg)  08/14/21 280 lb (127 kg)       Physical Exam HENT:     Head: Normocephalic.     Mouth/Throat:     Mouth: Mucous membranes are moist.  Eyes:     Extraocular Movements: Extraocular movements intact.     Pupils: Pupils are equal, round, and reactive to light.  Pulmonary:     Breath sounds: Examination of the right-upper field reveals wheezing. Examination of the left-upper field reveals wheezing. Examination of the right-middle field reveals wheezing. Examination of the left-middle field reveals wheezing. Examination of the right-lower field reveals wheezing. Examination of the left-lower field reveals wheezing. Decreased breath sounds and wheezing present.  Neurological:     Mental Status: He is alert.      No results found for any visits on 09/06/21.  Last CBC Lab Results  Component Value Date   WBC 9.9 12/29/2020   HGB 13.4 12/29/2020   HCT 42.4 12/29/2020   MCV 83.6 12/29/2020   MCH 26.4 12/29/2020   RDW 16.0 (H) 12/29/2020   PLT 279 87/56/4332   Last metabolic panel Lab Results  Component Value Date   GLUCOSE 166 (H) 08/08/2021   NA 141 08/08/2021   K 4.1 08/08/2021   CL 102 08/08/2021   CO2 20 08/08/2021   BUN 12 08/08/2021   CREATININE 1.47 (H) 08/08/2021   EGFR 56 (L) 08/08/2021   CALCIUM 9.5 08/08/2021   PHOS 6.0 (H) 09/02/2015   PROT 8.5 02/24/2020   ALBUMIN 4.7 02/24/2020  LABGLOB 3.8 02/24/2020   AGRATIO 1.2 02/24/2020   BILITOT <0.2 02/24/2020   ALKPHOS 104 02/24/2020   AST 18 02/24/2020   ALT 28 02/24/2020   ANIONGAP 8 12/29/2020   Last lipids Lab Results  Component Value Date   CHOL 161 08/08/2021   HDL 46 08/08/2021   LDLCALC 99 08/08/2021   TRIG 87 08/08/2021   CHOLHDL 3.5 08/08/2021   Last hemoglobin A1c Lab Results  Component Value Date   HGBA1C 7.9 (H) 08/08/2021      The 10-year ASCVD risk score (Arnett DK, et al., 2019) is: 20.2%    Assessment & Plan:   1. Seasonal allergic rhinitis due to pollen - He will continue on current medication. -  montelukast (SINGULAIR) 10 MG tablet; TAKE ONE TABLET BY MOUTH AT BEDTIME  Dispense: 30 tablet; Refill: 3  2. Cough, unspecified type - He was started on Benzonatate for cough, educated on medication side effects and continues to make healthy lifestyle changes. He was advised to notify clinic for worsening symptoms. - benzonatate (TESSALON PERLES) 100 MG capsule; Take 1 capsule (100 mg total) by mouth 3 (three) times daily as needed.  Dispense: 20 capsule; Refill: 0  3. Wheezing - He has generalized expiratory wheezing, was started on Prednisone for possible inflammation. He was educated on medication side effects and advised to notify clinic. - predniSONE (DELTASONE) 10 MG tablet; Take 1 tablet (10 mg total) by mouth daily with breakfast.  Dispense: 5 tablet; Refill: 0  4. Vitamin D deficiency - He was started on ergocalciferol 50,000 units, for vitamin D of 12.8 ng/ml. - Vitamin D, Ergocalciferol, (DRISDOL) 1.25 MG (50000 UNIT) CAPS capsule; Take 1 capsule (50,000 Units total) by mouth every 7 (seven) days.  Dispense: 12 capsule; Refill: 0   Return in about 2 months (around 11/14/2021), or if symptoms worsen or fail to improve.    Bradley Mckenzie Jerold Coombe, NP

## 2021-09-07 ENCOUNTER — Other Ambulatory Visit: Payer: Self-pay

## 2021-10-01 ENCOUNTER — Other Ambulatory Visit: Payer: Self-pay

## 2021-10-02 ENCOUNTER — Ambulatory Visit: Payer: Self-pay | Admitting: Student

## 2021-10-02 ENCOUNTER — Other Ambulatory Visit: Payer: Self-pay

## 2021-10-02 VITALS — BP 117/76 | HR 107 | Temp 98.0°F | Ht 69.0 in | Wt 281.0 lb

## 2021-10-02 DIAGNOSIS — E119 Type 2 diabetes mellitus without complications: Secondary | ICD-10-CM

## 2021-10-02 NOTE — Progress Notes (Unsigned)
Follow up Diabetes/ Endocrine [facility]     Patient ID: Bradley Dib., male   DOB: January 07, 1966, 56 y.o.   MRN: [id number] Assessment:  Bradley Covelli. is a 56 y.o. male who is seen in follow up for No primary diagnosis found. at the request of Iloabachie, Chioma E, NP.  Encounter Diagnoses No diagnosis found.  Assessment  Patient is a 56 y/o male with T2DM and various other comorbidities who presents for routine DM care follow-up. He is currently at goal with treatment based on his reported fasting BG averaging 107-120, which indicate HbA1c more likely less than 7.0.   Plan:     **DM**  Patient was congratulated on the improvement noted in his HbA1c levels.   - Maintain the current drug regimen (metformin 500 mg BID, insulin glargine/Lantus 31 units, dapagliflozin propanediol/Farxiga 10 mg daily, semaglutide/Ozempic 2 mg) - Measure BG randomly a 2nd time during the 2 weeks leading to the next appointment.     - Since patient stated that his fasting BG levels have been in the 100-120s for the past few months despite past higher A1c, these additional measures would provide a more accurate picture of whether some prandial insulin may be needed. - Bring BG log at the next visit.     - This would ensure that we get a more accurate picture of the patient's BG over time. - Follow up in 3 months, with routine DM lab work obtained before the visit. - Follow up about [facility] application.     - Patient needs [facility] in order to access Nutritionist counseling. - Schedule an eye exam at [facility] for routine diabetic retinopathy screening. - Of note, patient has a history of prednisone use. If patient resumes this medication, his insulin intake may need to be adjusted.  **Hyperlipidemia**  - Increase atorvastatin/Lipitor from 10 mg to 20 mg.   There are no Patient Instructions on file for this visit.   No orders of the defined types were placed in this encounter.    Subjective:   Patient is a 56 y/o male with R4BU without complication, uncontrolled HTN, CKD stage II, hyperlipidemia, HypertensiveCM, chronic systolic heart failure (since 2016), asthma, COPD, depression, OSA, obesity. He presents for DM routine follow-up.  He states that he has "good and bad days" managing his DM, reporting fasting BG around 107-120 (range: 88 to 190s, with 190s rarely occurring). He states that he has had these readings for "many months." He reports polydipsia and polyuria, which he attributes to the diuretic medications he is currently taking. He denies symptoms of hypoglycemia, peripheral neuropathy, and foot health concerns.   Patient currently manages his DM using metformin 500 mg BID, insulin glargine/Lantus 31 units daily, dapagliflozin propanediol/Farxiga 10 mg tab, semaglutide/Ozempic 2 mg weekly. He states that the current metformin dose is the most he has been able to tolerate without having GI symptoms. He reports taking his current Ozempic dose for the past 2 months. Patient also takes various medications for his comorbidities; he is currently not taking prednisone. He reports rarely missing doses as he uses a pill box to organize his medications with the help of his wife. He notes that he had his last eye exam around April 2022, and his foot exam at his last [facility] visit.  Patient denies any significant recent weight change. He states that he has been unable to meet with a nutritionist as his [facility] application is pending. He notes that his COPD impedes  his ability to do chores around the house and his ability to exercise (5-10 min max on the treadmill before having to stop). He denies smoking and recreational drug use; he reports rare intake of alcohol socially.    Review of Systems  Respiratory:  Positive for cough, shortness of breath and wheezing.   Endocrine: Positive for polydipsia and polyuria.    Bradley Gae Gallop.  has a past medical history of Asthma, Cardiomyopathy ([place])  (2016), Chronic combined systolic (congestive) and diastolic (congestive) heart failure ([place]), COPD (chronic obstructive pulmonary disease) ([place]), Depression, Diabetes mellitus without complication ([place]) (0086), Hyperlipidemia, Hypertensive cardiomyopathy ([place]) (06/30/2015), Hypertensive heart disease, Morbid obesity ([place]), and Obstructive sleep apnea.  Family History, Social History, current Medications and allergies reviewed and updated in Epic.  Objective:    Blood pressure 117/76, pulse (!) 107, temperature 98 F (36.7 C), temperature source Oral, height 5\' 9"  (1.753 m), weight 281 lb (127.5 kg), SpO2 93 %. Physical Exam HENT:     Head: Normocephalic and atraumatic.  Cardiovascular:     Heart sounds: Normal heart sounds.  Pulmonary:     Breath sounds: Wheezing and rhonchi present.  Musculoskeletal:        General: No swelling or signs of injury.     Right lower leg: No edema.     Left lower leg: No edema.  Neurological:     Mental Status: He is alert.   Lower extremities: No edema, pain, bruises, scars, or fungal infections noted     Data : I have personally reviewed pertinent labs and imaging studies, if indicated,  with the patient in clinic today.   Lab Orders  No laboratory test(s) ordered today    HC Readings from Last 3 Encounters:  No data found for [facility]    Wt Readings from Last 3 Encounters:  10/02/21 281 lb (127.5 kg)  09/06/21 280 lb 12.8 oz (127.4 kg)  08/14/21 277 lb 11.2 oz (126 kg)

## 2021-10-03 ENCOUNTER — Other Ambulatory Visit: Payer: Self-pay

## 2021-10-23 ENCOUNTER — Other Ambulatory Visit: Payer: Self-pay

## 2021-10-24 ENCOUNTER — Other Ambulatory Visit: Payer: Self-pay

## 2021-11-06 ENCOUNTER — Other Ambulatory Visit: Payer: Self-pay

## 2021-11-08 ENCOUNTER — Other Ambulatory Visit: Payer: Self-pay

## 2021-11-11 ENCOUNTER — Other Ambulatory Visit: Payer: Self-pay

## 2021-11-12 ENCOUNTER — Other Ambulatory Visit: Payer: Self-pay

## 2021-11-13 ENCOUNTER — Other Ambulatory Visit: Payer: Self-pay

## 2021-11-13 MED ORDER — INSULIN GLARGINE 100 UNIT/ML SOLOSTAR PEN
PEN_INJECTOR | SUBCUTANEOUS | 3 refills | Status: DC
  Filled 2021-11-19: qty 30, fill #0

## 2021-11-14 ENCOUNTER — Other Ambulatory Visit: Payer: Self-pay | Admitting: Gerontology

## 2021-11-14 ENCOUNTER — Other Ambulatory Visit: Payer: Self-pay

## 2021-11-14 DIAGNOSIS — R059 Cough, unspecified: Secondary | ICD-10-CM

## 2021-11-14 DIAGNOSIS — Z0000 Encounter for general adult medical examination without abnormal findings: Secondary | ICD-10-CM

## 2021-11-14 MED ORDER — BENZONATATE 100 MG PO CAPS
100.0000 mg | ORAL_CAPSULE | Freq: Three times a day (TID) | ORAL | 0 refills | Status: DC | PRN
  Filled 2021-11-14: qty 20, 7d supply, fill #0

## 2021-11-14 MED ORDER — INSULIN GLARGINE 100 UNIT/ML SOLOSTAR PEN
PEN_INJECTOR | SUBCUTANEOUS | 3 refills | Status: DC
  Filled 2021-11-19: qty 30, fill #0

## 2021-11-15 ENCOUNTER — Other Ambulatory Visit: Payer: Self-pay

## 2021-11-15 LAB — HEMOGLOBIN A1C
Est. average glucose Bld gHb Est-mCnc: 180 mg/dL
Hgb A1c MFr Bld: 7.9 % — ABNORMAL HIGH (ref 4.8–5.6)

## 2021-11-15 LAB — COMPREHENSIVE METABOLIC PANEL
ALT: 30 IU/L (ref 0–44)
AST: 18 IU/L (ref 0–40)
Albumin/Globulin Ratio: 1.4 (ref 1.2–2.2)
Albumin: 4.5 g/dL (ref 3.8–4.9)
Alkaline Phosphatase: 139 IU/L — ABNORMAL HIGH (ref 44–121)
BUN/Creatinine Ratio: 11 (ref 9–20)
BUN: 18 mg/dL (ref 6–24)
Bilirubin Total: 0.2 mg/dL (ref 0.0–1.2)
CO2: 22 mmol/L (ref 20–29)
Calcium: 10 mg/dL (ref 8.7–10.2)
Chloride: 96 mmol/L (ref 96–106)
Creatinine, Ser: 1.65 mg/dL — ABNORMAL HIGH (ref 0.76–1.27)
Globulin, Total: 3.2 g/dL (ref 1.5–4.5)
Glucose: 290 mg/dL — ABNORMAL HIGH (ref 70–99)
Potassium: 3.9 mmol/L (ref 3.5–5.2)
Sodium: 138 mmol/L (ref 134–144)
Total Protein: 7.7 g/dL (ref 6.0–8.5)
eGFR: 49 mL/min/{1.73_m2} — ABNORMAL LOW (ref >59)

## 2021-11-15 LAB — LIPID PANEL
Chol/HDL Ratio: 3.5 ratio (ref 0.0–5.0)
Cholesterol, Total: 163 mg/dL (ref 100–199)
HDL: 47 mg/dL
LDL Chol Calc (NIH): 92 mg/dL (ref 0–99)
Triglycerides: 138 mg/dL (ref 0–149)
VLDL Cholesterol Cal: 24 mg/dL (ref 5–40)

## 2021-11-15 LAB — VITAMIN D 25 HYDROXY (VIT D DEFICIENCY, FRACTURES): Vit D, 25-Hydroxy: 32.6 ng/mL (ref 30.0–100.0)

## 2021-11-16 ENCOUNTER — Other Ambulatory Visit: Payer: Self-pay

## 2021-11-19 ENCOUNTER — Other Ambulatory Visit: Payer: Self-pay | Admitting: Gerontology

## 2021-11-19 ENCOUNTER — Other Ambulatory Visit: Payer: Self-pay | Admitting: Family

## 2021-11-19 ENCOUNTER — Other Ambulatory Visit: Payer: Self-pay

## 2021-11-19 DIAGNOSIS — I5032 Chronic diastolic (congestive) heart failure: Secondary | ICD-10-CM

## 2021-11-19 MED ORDER — TORSEMIDE 20 MG PO TABS
40.0000 mg | ORAL_TABLET | Freq: Every day | ORAL | 3 refills | Status: DC
  Filled 2021-11-19: qty 270, 90d supply, fill #0
  Filled 2022-09-15: qty 270, 90d supply, fill #1

## 2021-11-19 MED FILL — Ipratropium-Albuterol Nebu Soln 0.5-2.5(3) MG/3ML: RESPIRATORY_TRACT | 5 days supply | Qty: 90 | Fill #1 | Status: AC

## 2021-11-19 MED FILL — Cetirizine HCl Tab 10 MG: ORAL | 90 days supply | Qty: 90 | Fill #1 | Status: AC

## 2021-11-19 MED FILL — Metformin HCl Tab 500 MG: ORAL | 90 days supply | Qty: 180 | Fill #1 | Status: AC

## 2021-11-19 MED FILL — Hydralazine HCl Tab 25 MG: ORAL | 90 days supply | Qty: 180 | Fill #1 | Status: AC

## 2021-11-20 ENCOUNTER — Other Ambulatory Visit: Payer: Self-pay

## 2021-11-20 MED ORDER — RIGHTEST GL300 LANCETS MISC
1.0000 | Freq: Every day | 0 refills | Status: AC
  Filled 2021-11-20: qty 100, 100d supply, fill #0

## 2021-11-20 MED ORDER — RIGHTEST GS550 BLOOD GLUCOSE VI STRP
ORAL_STRIP | 0 refills | Status: DC
  Filled 2021-11-20: qty 50, 50d supply, fill #0

## 2021-11-23 ENCOUNTER — Other Ambulatory Visit: Payer: Self-pay

## 2021-11-23 ENCOUNTER — Other Ambulatory Visit: Payer: Self-pay | Admitting: Gerontology

## 2021-11-26 ENCOUNTER — Other Ambulatory Visit: Payer: Self-pay

## 2021-11-27 ENCOUNTER — Ambulatory Visit: Payer: Self-pay | Admitting: Gerontology

## 2021-11-27 ENCOUNTER — Other Ambulatory Visit: Payer: Self-pay

## 2021-11-28 ENCOUNTER — Encounter: Payer: Self-pay | Admitting: Gerontology

## 2021-11-28 ENCOUNTER — Ambulatory Visit: Payer: Self-pay | Admitting: Gerontology

## 2021-11-28 ENCOUNTER — Other Ambulatory Visit: Payer: Self-pay

## 2021-11-28 VITALS — BP 147/81 | HR 99 | Wt 288.5 lb

## 2021-11-28 DIAGNOSIS — E119 Type 2 diabetes mellitus without complications: Secondary | ICD-10-CM

## 2021-11-28 DIAGNOSIS — G4700 Insomnia, unspecified: Secondary | ICD-10-CM | POA: Insufficient documentation

## 2021-11-28 DIAGNOSIS — R7989 Other specified abnormal findings of blood chemistry: Secondary | ICD-10-CM

## 2021-11-28 DIAGNOSIS — E559 Vitamin D deficiency, unspecified: Secondary | ICD-10-CM

## 2021-11-28 DIAGNOSIS — N182 Chronic kidney disease, stage 2 (mild): Secondary | ICD-10-CM

## 2021-11-28 MED ORDER — VITAMIN D (ERGOCALCIFEROL) 1.25 MG (50000 UNIT) PO CAPS
50000.0000 [IU] | ORAL_CAPSULE | ORAL | 0 refills | Status: DC
  Filled 2021-11-28: qty 12, 84d supply, fill #0

## 2021-11-28 MED ORDER — MELATONIN 1 MG PO CAPS
1.0000 mg | ORAL_CAPSULE | Freq: Every day | ORAL | 0 refills | Status: DC
  Filled 2021-11-28: qty 30, 30d supply, fill #0

## 2021-11-28 MED FILL — Atorvastatin Calcium Tab 10 MG (Base Equivalent): ORAL | 90 days supply | Qty: 90 | Fill #0 | Status: AC

## 2021-11-28 MED FILL — Losartan Potassium Tab 100 MG: ORAL | 90 days supply | Qty: 90 | Fill #0 | Status: AC

## 2021-11-28 MED FILL — Potassium Chloride Microencapsulated Crys ER Tab 20 mEq: ORAL | 90 days supply | Qty: 180 | Fill #0 | Status: AC

## 2021-11-28 MED FILL — Carvedilol Tab 12.5 MG: ORAL | 90 days supply | Qty: 180 | Fill #0 | Status: AC

## 2021-11-28 NOTE — Patient Instructions (Signed)
Carbohydrate Counting for Diabetes Mellitus, Adult Carbohydrate counting is a method of keeping track of how many carbohydrates you eat. Eating carbohydrates increases the amount of sugar (glucose) in the blood. Counting how many carbohydrates you eat improves how well you manage your blood glucose. This, in turn, helps you manage your diabetes. Carbohydrates are measured in grams (g) per serving. It is important to know how many carbohydrates (in grams or by serving size) you can have in each meal. This is different for every person. A dietitian can help you make a meal plan and calculate how many carbohydrates you should have at each meal and snack. What foods contain carbohydrates? Carbohydrates are found in the following foods: Grains, such as breads and cereals. Dried beans and soy products. Starchy vegetables, such as potatoes, peas, and corn. Fruit and fruit juices. Milk and yogurt. Sweets and snack foods, such as cake, cookies, candy, chips, and soft drinks. How do I count carbohydrates in foods? There are two ways to count carbohydrates in food. You can read food labels or learn standard serving sizes of foods. You can use either of these methods or a combination of both. Using the Nutrition Facts label The Nutrition Facts list is included on the labels of almost all packaged foods and beverages in the [place]. It includes: The serving size. Information about nutrients in each serving, including the grams of carbohydrate per serving. To use the Nutrition Facts, decide how many servings you will have. Then, multiply the number of servings by the number of carbohydrates per serving. The resulting number is the total grams of carbohydrates that you will be having. Learning the standard serving sizes of foods When you eat carbohydrate foods that are not packaged or do not include Nutrition Facts on the label, you need to measure the servings in order to count the grams of  carbohydrates. Measure the foods that you will eat with a food scale or measuring cup, if needed. Decide how many standard-size servings you will eat. Multiply the number of servings by 15. For foods that contain carbohydrates, one serving equals 15 g of carbohydrates. For example, if you eat 2 cups or 10 oz (300 g) of strawberries, you will have eaten 2 servings and 30 g of carbohydrates (2 servings x 15 g = 30 g). For foods that have more than one food mixed, such as soups and casseroles, you must count the carbohydrates in each food that is included. The following list contains standard serving sizes of common carbohydrate-rich foods. Each of these servings has about 15 g of carbohydrates: 1 slice of bread. 1 six-inch (15 cm) tortilla. ? cup or 2 oz (53 g) cooked rice or pasta.  cup or 3 oz (85 g) cooked or canned, drained and rinsed beans or lentils.  cup or 3 oz (85 g) starchy vegetable, such as peas, corn, or squash.  cup or 4 oz (120 g) hot cereal.  cup or 3 oz (85 g) boiled or mashed potatoes, or  or 3 oz (85 g) of a large baked potato.  cup or 4 fl oz (118 mL) fruit juice. 1 cup or 8 fl oz (237 mL) milk. 1 small or 4 oz (106 g) apple.  or 2 oz (63 g) of a medium banana. 1 cup or 5 oz (150 g) strawberries. 3 cups or 1 oz (28.3 g) popped popcorn. What is an example of carbohydrate counting? To calculate the grams of carbohydrates in this sample meal, follow the steps   shown below. Sample meal 3 oz (85 g) chicken breast. ? cup or 4 oz (106 g) brown rice.  cup or 3 oz (85 g) corn. 1 cup or 8 fl oz (237 mL) milk. 1 cup or 5 oz (150 g) strawberries with sugar-free whipped topping. Carbohydrate calculation Identify the foods that contain carbohydrates: Rice. Corn. Milk. Strawberries. Calculate how many servings you have of each food: 2 servings rice. 1 serving corn. 1 serving milk. 1 serving strawberries. Multiply each number of servings by 15 g: 2 servings rice x 15  g = 30 g. 1 serving corn x 15 g = 15 g. 1 serving milk x 15 g = 15 g. 1 serving strawberries x 15 g = 15 g. Add together all of the amounts to find the total grams of carbohydrates eaten: 30 g + 15 g + 15 g + 15 g = 75 g of carbohydrates total. What are tips for following this plan? Shopping Develop a meal plan and then make a shopping list. Buy fresh and frozen vegetables, fresh and frozen fruit, dairy, eggs, beans, lentils, and whole grains. Look at food labels. Choose foods that have more fiber and less sugar. Avoid processed foods and foods with added sugars. Meal planning Aim to have the same number of grams of carbohydrates at each meal and for each snack time. Plan to have regular, balanced meals and snacks. Where to find more information American Diabetes Association: diabetes.org Centers for Disease Control and Prevention: cdc.gov Academy of Nutrition and Dietetics: eatright.org Association of Diabetes Care & Education Specialists: diabeteseducator.org Summary Carbohydrate counting is a method of keeping track of how many carbohydrates you eat. Eating carbohydrates increases the amount of sugar (glucose) in your blood. Counting how many carbohydrates you eat improves how well you manage your blood glucose. This helps you manage your diabetes. A dietitian can help you make a meal plan and calculate how many carbohydrates you should have at each meal and snack. This information is not intended to replace advice given to you by your health care provider. Make sure you discuss any questions you have with your health care provider. Document Revised: 08/04/2019 Document Reviewed: 08/04/2019 Elsevier Patient Education  2023 Elsevier Inc. DASH Eating Plan DASH stands for Dietary Approaches to Stop Hypertension. The DASH eating plan is a healthy eating plan that has been shown to: Reduce high blood pressure (hypertension). Reduce your risk for type 2 diabetes, heart disease, and  stroke. Help with weight loss. What are tips for following this plan? Reading food labels Check food labels for the amount of salt (sodium) per serving. Choose foods with less than 5 percent of the Daily Value of sodium. Generally, foods with less than 300 milligrams (mg) of sodium per serving fit into this eating plan. To find whole grains, look for the word "whole" as the first word in the ingredient list. Shopping Buy products labeled as "low-sodium" or "no salt added." Buy fresh foods. Avoid canned foods and pre-made or frozen meals. Cooking Avoid adding salt when cooking. Use salt-free seasonings or herbs instead of table salt or sea salt. Check with your health care provider or pharmacist before using salt substitutes. Do not fry foods. Cook foods using healthy methods such as baking, boiling, grilling, roasting, and broiling instead. Cook with heart-healthy oils, such as olive, canola, avocado, soybean, or sunflower oil. Meal planning  Eat a balanced diet that includes: 4 or more servings of fruits and 4 or more servings of vegetables each day.   Try to fill one-half of your plate with fruits and vegetables. 6-8 servings of whole grains each day. Less than 6 oz (170 g) of lean meat, poultry, or fish each day. A 3-oz (85-g) serving of meat is about the same size as a deck of cards. One egg equals 1 oz (28 g). 2-3 servings of low-fat dairy each day. One serving is 1 cup (237 mL). 1 serving of nuts, seeds, or beans 5 times each week. 2-3 servings of heart-healthy fats. Healthy fats called omega-3 fatty acids are found in foods such as walnuts, flaxseeds, fortified milks, and eggs. These fats are also found in cold-water fish, such as sardines, salmon, and mackerel. Limit how much you eat of: Canned or prepackaged foods. Food that is high in trans fat, such as some fried foods. Food that is high in saturated fat, such as fatty meat. Desserts and other sweets, sugary drinks, and other foods  with added sugar. Full-fat dairy products. Do not salt foods before eating. Do not eat more than 4 egg yolks a week. Try to eat at least 2 vegetarian meals a week. Eat more home-cooked food and less restaurant, buffet, and fast food. Lifestyle When eating at a restaurant, ask that your food be prepared with less salt or no salt, if possible. If you drink alcohol: Limit how much you use to: 0-1 drink a day for women who are not pregnant. 0-2 drinks a day for men. Be aware of how much alcohol is in your drink. In the [place] one drink equals one 12 oz bottle of beer (355 mL), one 5 oz glass of wine (148 mL), or one 1 oz glass of hard liquor (44 mL). General information Avoid eating more than 2,300 mg of salt a day. If you have hypertension, you may need to reduce your sodium intake to 1,500 mg a day. Work with your health care provider to maintain a healthy body weight or to lose weight. Ask what an ideal weight is for you. Get at least 30 minutes of exercise that causes your heart to beat faster (aerobic exercise) most days of the week. Activities may include walking, swimming, or biking. Work with your health care provider or dietitian to adjust your eating plan to your individual calorie needs. What foods should I eat? Fruits All fresh, dried, or frozen fruit. Canned fruit in natural juice (without added sugar). Vegetables Fresh or frozen vegetables (raw, steamed, roasted, or grilled). Low-sodium or reduced-sodium tomato and vegetable juice. Low-sodium or reduced-sodium tomato sauce and tomato paste. Low-sodium or reduced-sodium canned vegetables. Grains Whole-grain or whole-wheat bread. Whole-grain or whole-wheat pasta. Brown rice. Oatmeal. Quinoa. Bulgur. Whole-grain and low-sodium cereals. Pita bread. Low-fat, low-sodium crackers. Whole-wheat flour tortillas. Meats and other proteins Skinless chicken or turkey. Ground chicken or turkey. Pork with fat trimmed off. Fish and seafood. Egg  whites. Dried beans, peas, or lentils. Unsalted nuts, nut butters, and seeds. Unsalted canned beans. Lean cuts of beef with fat trimmed off. Low-sodium, lean precooked or cured meat, such as sausages or meat loaves. Dairy Low-fat (1%) or fat-free (skim) milk. Reduced-fat, low-fat, or fat-free cheeses. Nonfat, low-sodium ricotta or cottage cheese. Low-fat or nonfat yogurt. Low-fat, low-sodium cheese. Fats and oils Soft margarine without trans fats. Vegetable oil. Reduced-fat, low-fat, or light mayonnaise and salad dressings (reduced-sodium). Canola, safflower, olive, avocado, soybean, and sunflower oils. Avocado. Seasonings and condiments Herbs. Spices. Seasoning mixes without salt. Other foods Unsalted popcorn and pretzels. Fat-free sweets. The items listed above may not be   a complete list of foods and beverages you can eat. Contact a dietitian for more information. What foods should I avoid? Fruits Canned fruit in a light or heavy syrup. Fried fruit. Fruit in cream or butter sauce. Vegetables Creamed or fried vegetables. Vegetables in a cheese sauce. Regular canned vegetables (not low-sodium or reduced-sodium). Regular canned tomato sauce and paste (not low-sodium or reduced-sodium). Regular tomato and vegetable juice (not low-sodium or reduced-sodium). Pickles. Olives. Grains Baked goods made with fat, such as croissants, muffins, or some breads. Dry pasta or rice meal packs. Meats and other proteins Fatty cuts of meat. Ribs. Fried meat. Bacon. Bologna, salami, and other precooked or cured meats, such as sausages or meat loaves. Fat from the back of a pig (fatback). Bratwurst. Salted nuts and seeds. Canned beans with added salt. Canned or smoked fish. Whole eggs or egg yolks. Chicken or turkey with skin. Dairy Whole or 2% milk, cream, and half-and-half. Whole or full-fat cream cheese. Whole-fat or sweetened yogurt. Full-fat cheese. Nondairy creamers. Whipped toppings. Processed cheese and  cheese spreads. Fats and oils Butter. Stick margarine. Lard. Shortening. Ghee. Bacon fat. Tropical oils, such as coconut, palm kernel, or palm oil. Seasonings and condiments Onion salt, garlic salt, seasoned salt, table salt, and sea salt. Worcestershire sauce. Tartar sauce. Barbecue sauce. Teriyaki sauce. Soy sauce, including reduced-sodium. Steak sauce. Canned and packaged gravies. Fish sauce. Oyster sauce. Cocktail sauce. Store-bought horseradish. Ketchup. Mustard. Meat flavorings and tenderizers. Bouillon cubes. Hot sauces. Pre-made or packaged marinades. Pre-made or packaged taco seasonings. Relishes. Regular salad dressings. Other foods Salted popcorn and pretzels. The items listed above may not be a complete list of foods and beverages you should avoid. Contact a dietitian for more information. Where to find more information National Heart, Lung, and Blood Institute: www.nhlbi.nih.gov American Heart Association: www.heart.org Academy of Nutrition and Dietetics: www.eatright.org National Kidney Foundation: www.kidney.org Summary The DASH eating plan is a healthy eating plan that has been shown to reduce high blood pressure (hypertension). It may also reduce your risk for type 2 diabetes, heart disease, and stroke. When on the DASH eating plan, aim to eat more fresh fruits and vegetables, whole grains, lean proteins, low-fat dairy, and heart-healthy fats. With the DASH eating plan, you should limit salt (sodium) intake to 2,300 mg a day. If you have hypertension, you may need to reduce your sodium intake to 1,500 mg a day. Work with your health care provider or dietitian to adjust your eating plan to your individual calorie needs. This information is not intended to replace advice given to you by your health care provider. Make sure you discuss any questions you have with your health care provider. Document Revised: 12/04/2018 Document Reviewed: 12/04/2018 Elsevier Patient Education  2023  Elsevier Inc.  

## 2021-11-28 NOTE — Progress Notes (Unsigned)
Established Patient Office Visit  Subjective   Patient ID: Bradley Mckenzie., male    DOB: 1965-11-13  Age: 56 y.o. MRN: [id number]  Chief Complaint  Patient presents with   Follow-up         HPI  Choua Chalker.  is a 56 y.o. male who  has a past medical history of Asthma, Cardiomyopathy ([place]) (2016), Chronic combined systolic (congestive) and diastolic (congestive) heart failure ([place]), COPD (chronic obstructive pulmonary disease) ([place]), Depression, Diabetes mellitus without complication ([place]) (3582), Hyperlipidemia, Hypertensive cardiomyopathy ([place]) (06/30/2015), Hypertensive heart disease, Morbid obesity ([place]), and Obstructive sleep apnea presents for medication refill  His HgbA1c done on 11/14/21 was 7.9%, His Serum creatinine was 1.65 mg/dl and eGFR was 49.He states that for the last couple of days he's experiencing difficulty  fallen asleep and staying asleep, and he takes frequent naps during the day. He states that he wakes up constantly and can't get back to sleep. He states that he's tired and not getting enough rest. He states that he's been sleeping with the TV on and currently, he's unable to get enough sleep nor rest.  Review of Systems  Constitutional: Negative.   Respiratory: Negative.    Cardiovascular: Negative.   Skin: Negative.   Neurological: Negative.   Endo/Heme/Allergies: Negative.   Psychiatric/Behavioral:  The patient has insomnia.       Objective:     BP (!) 147/81 (BP Location: Right Arm, Patient Position: Sitting, Cuff Size: Large)   Pulse 99   Wt 288 lb 8 oz (130.9 kg)   SpO2 93%   BMI 42.60 kg/m  BP Readings from Last 3 Encounters:  11/28/21 (!) 147/81  10/02/21 117/76  09/06/21 135/84   Wt Readings from Last 3 Encounters:  11/28/21 288 lb 8 oz (130.9 kg)  10/02/21 281 lb (127.5 kg)  09/06/21 280 lb 12.8 oz (127.4 kg)      Physical Exam HENT:     Head: Normocephalic and atraumatic.     Mouth/Throat:     Mouth: Mucous membranes are  moist.  Eyes:     Extraocular Movements: Extraocular movements intact.     Conjunctiva/sclera: Conjunctivae normal.     Pupils: Pupils are equal, round, and reactive to light.  Cardiovascular:     Rate and Rhythm: Normal rate and regular rhythm.     Pulses: Normal pulses.     Heart sounds: Normal heart sounds.  Pulmonary:     Effort: Pulmonary effort is normal.     Breath sounds: Normal breath sounds.  Neurological:     Mental Status: He is alert.      No results found for any visits on 11/28/21.  Last CBC Lab Results  Component Value Date   WBC 9.9 12/29/2020   HGB 13.4 12/29/2020   HCT 42.4 12/29/2020   MCV 83.6 12/29/2020   MCH 26.4 12/29/2020   RDW 16.0 (H) 12/29/2020   PLT 279 51/89/8421   Last metabolic panel Lab Results  Component Value Date   GLUCOSE 290 (H) 11/14/2021   NA 138 11/14/2021   K 3.9 11/14/2021   CL 96 11/14/2021   CO2 22 11/14/2021   BUN 18 11/14/2021   CREATININE 1.65 (H) 11/14/2021   EGFR 49 (L) 11/14/2021   CALCIUM 10.0 11/14/2021   PHOS 6.0 (H) 09/02/2015   PROT 7.7 11/14/2021   ALBUMIN 4.5 11/14/2021   LABGLOB 3.2 11/14/2021   AGRATIO 1.4 11/14/2021   BILITOT 0.2 11/14/2021   ALKPHOS 139 (H)  11/14/2021   AST 18 11/14/2021   ALT 30 11/14/2021   ANIONGAP 8 12/29/2020   Last lipids Lab Results  Component Value Date   CHOL 163 11/14/2021   HDL 47 11/14/2021   LDLCALC 92 11/14/2021   TRIG 138 11/14/2021   CHOLHDL 3.5 11/14/2021   Last hemoglobin A1c Lab Results  Component Value Date   HGBA1C 7.9 (H) 11/14/2021   Last thyroid functions Lab Results  Component Value Date   TSH 1.140 11/22/2020   Last vitamin D Lab Results  Component Value Date   VD25OH 32.6 11/14/2021      The 10-year ASCVD risk score (Arnett DK, et al., 2019) is: 23.2%    Assessment & Plan:   Problem List Items Addressed This Visit   None   No follow-ups on file.    Raedyn Klinck Jerold Coombe, NP

## 2021-12-04 ENCOUNTER — Ambulatory Visit: Payer: Self-pay

## 2021-12-07 ENCOUNTER — Other Ambulatory Visit: Payer: Self-pay

## 2021-12-07 MED ORDER — FLUTICASONE-SALMETEROL 500-50 MCG/ACT IN AEPB
1.0000 | INHALATION_SPRAY | Freq: Two times a day (BID) | RESPIRATORY_TRACT | 3 refills | Status: DC
  Filled 2021-12-07 – 2021-12-14 (×2): qty 180, 90d supply, fill #0

## 2021-12-09 ENCOUNTER — Encounter (HOSPITAL_COMMUNITY): Payer: Self-pay | Admitting: *Deleted

## 2021-12-09 ENCOUNTER — Other Ambulatory Visit: Payer: Self-pay

## 2021-12-09 ENCOUNTER — Emergency Department (HOSPITAL_COMMUNITY)
Admission: EM | Admit: 2021-12-09 | Discharge: 2021-12-09 | Disposition: A | Discharge status: Home / Self Care | Payer: Self-pay | Attending: Emergency Medicine | Admitting: Emergency Medicine

## 2021-12-09 DIAGNOSIS — Z7984 Long term (current) use of oral hypoglycemic drugs: Secondary | ICD-10-CM | POA: Insufficient documentation

## 2021-12-09 DIAGNOSIS — E1140 Type 2 diabetes mellitus with diabetic neuropathy, unspecified: Secondary | ICD-10-CM | POA: Insufficient documentation

## 2021-12-09 DIAGNOSIS — I509 Heart failure, unspecified: Secondary | ICD-10-CM | POA: Insufficient documentation

## 2021-12-09 DIAGNOSIS — Z794 Long term (current) use of insulin: Secondary | ICD-10-CM | POA: Insufficient documentation

## 2021-12-09 DIAGNOSIS — Z7951 Long term (current) use of inhaled steroids: Secondary | ICD-10-CM | POA: Insufficient documentation

## 2021-12-09 DIAGNOSIS — I110 Hypertensive heart disease with heart failure: Secondary | ICD-10-CM | POA: Insufficient documentation

## 2021-12-09 DIAGNOSIS — J449 Chronic obstructive pulmonary disease, unspecified: Secondary | ICD-10-CM | POA: Insufficient documentation

## 2021-12-09 DIAGNOSIS — R062 Wheezing: Secondary | ICD-10-CM | POA: Insufficient documentation

## 2021-12-09 DIAGNOSIS — Z79899 Other long term (current) drug therapy: Secondary | ICD-10-CM | POA: Insufficient documentation

## 2021-12-09 DIAGNOSIS — G5792 Unspecified mononeuropathy of left lower limb: Secondary | ICD-10-CM | POA: Insufficient documentation

## 2021-12-09 NOTE — ED Provider Notes (Signed)
[facility] EMERGENCY DEPARTMENT Provider Note   CSN: [id number] Arrival date & time: 12/09/21  1553     History Chief Complaint  Patient presents with   Foot Pain    Bradley Ladarrell Cornwall. is a 56 y.o. male.   Foot Pain Pertinent negatives include no chest pain.  Patient presents with several days of left foot pain and numbness. He describes the pain as a burning sensation following the arch of foot and most notable when walking. States that numbness feels like it is isolated on along the bottom of big toe into planter surface. Denies any weakness in legs during this time, loss of bowel or bladder control, fevers, weight loss, chest pain, or shortness of breath. Patient has complex comorbid history including HTN, type 2 diabetes, CHF, and COPD.     Home Medications Prior to Admission medications   Medication Sig Start Date End Date Taking? Authorizing Provider  albuterol (PROAIR HFA) 108 (90 Base) MCG/ACT inhaler Inhale 2 puffs into the lungs every 6 (six) hours as needed for wheezing or shortness of breath. 02/08/21   Iloabachie, Chioma E, NP  atorvastatin (LIPITOR) 10 MG tablet TAKE ONE TABLET BY MOUTH EVERY DAY 11/28/21 11/28/22  Iloabachie, Chioma E, NP  benzonatate (TESSALON PERLES) 100 MG capsule Take 1 capsule (100 mg total) by mouth 3 (three) times daily as needed. 11/14/21   Iloabachie, Chioma E, NP  Blood Pressure Monitoring (BLOOD PRESSURE CUFF) MISC USE AS DIRECTED ONCE DAILY AT 12 NOON. 11/09/20   Regalado, Belkys A, MD  carvedilol (COREG) 12.5 MG tablet Take 1 tablet (12.5 mg total) by mouth 2 (two) times daily with a meal. 11/28/21   Iloabachie, Chioma E, NP  cetirizine (ZYRTEC) 10 MG tablet TAKE ONE TABLET BY MOUTH EVERY DAY 07/24/21 07/24/22  Iloabachie, Chioma E, NP  dapagliflozin propanediol (FARXIGA) 10 MG TABS tablet TAKE ONE TABLET BY MOUTH EVERY DAY BEFORE BREAKFAST 01/30/21 01/30/22  Darylene Price A, FNP  fluticasone-salmeterol (ADVAIR DISKUS) 500-50 MCG/ACT AEPB  Inhale 1 puff into the lungs in the morning and at bedtime. 02/21/21   Iloabachie, Chioma E, NP  fluticasone-salmeterol (WIXELA INHUB) 500-50 MCG/ACT AEPB Inhale 1 puff into the lungs twice a day in the morning and at bedtime. 12/04/21   Iloabachie, Chioma E, NP  glucose blood (RIGHTEST GS550 BLOOD GLUCOSE) test strip USE AS DIRECTED. 11/20/21   Iloabachie, Chioma E, NP  hydrALAZINE (APRESOLINE) 25 MG tablet TAKE ONE TABLET BY MOUTH 2 TIMES A DAY 08/07/21 04/21/22  Iloabachie, Chioma E, NP  insulin glargine (LANTUS) 100 UNIT/ML Solostar Pen Inject 31 Units into the skin once daily. 02/21/21 02/25/22  Iloabachie, Chioma E, NP  insulin glargine (LANTUS) 100 UNIT/ML Solostar Pen Inject 31 units into the skin once daily 11/08/21   Iloabachie, Chioma E, NP  insulin glargine (LANTUS) 100 UNIT/ML Solostar Pen Inject 31 units into the skin once daily 11/08/21   Iloabachie, Chioma E, NP  Insulin Pen Needle 32G X 4 MM MISC Use as directed 07/18/21   Iloabachie, Chioma E, NP  ipratropium-albuterol (DUONEB) 0.5-2.5 (3) MG/3ML SOLN INHALE CONTENTS OF ONE VIAL (3ML) USING NEBULIZER ONCE EVERY 4 HOURS AS NEEDED 07/10/21 07/10/22  Iloabachie, Chioma E, NP  losartan (COZAAR) 100 MG tablet TAKE ONE TABLET BY MOUTH EVERY DAY 11/28/21 11/28/22  Iloabachie, Chioma E, NP  Melatonin 1 MG CAPS Take 1 capsule (1 mg total) by mouth at bedtime. 11/28/21   Iloabachie, Chioma E, NP  metFORMIN (GLUCOPHAGE) 500 MG tablet TAKE ONE TABLET BY  MOUTH 2 TIMES A DAY WITH A MEAL. 08/07/21 08/07/22  Iloabachie, Chioma E, NP  montelukast (SINGULAIR) 10 MG tablet TAKE ONE TABLET BY MOUTH AT BEDTIME 09/06/21 09/06/22  Iloabachie, Chioma E, NP  potassium chloride SA (KLOR-CON M) 20 MEQ tablet TAKE ONE TABLET BY MOUTH 2 TIMES A DAY 11/28/21 11/28/22  Iloabachie, Chioma E, NP  Rightest GL300 Lancets MISC USE AS DIRECTED. 11/20/21   Iloabachie, Chioma E, NP  Semaglutide, 1 MG/DOSE, (OZEMPIC, 1 MG/DOSE,) 4 MG/3ML SOPN Inject 2 mg (2 x 1 mg injections) into the skin  once a week. 06/05/21   Iloabachie, Chioma E, NP  Semaglutide, 2 MG/DOSE, (OZEMPIC, 2 MG/DOSE,) 8 MG/3ML SOPN Inject 2mg  into the skin once a week 08/14/21   Iloabachie, Chioma E, NP  torsemide (DEMADEX) 20 MG tablet Take 2 tablets (40 mg total) by mouth once daily. May take additional 1 tablet (20mg  total) in the evening if needed. 11/19/21   Alisa Graff, FNP  Vitamin D, Ergocalciferol, (DRISDOL) 1.25 MG (50000 UNIT) CAPS capsule Take 1 capsule (50,000 Units total) by mouth every 7 (seven) days. 11/28/21   Iloabachie, Chioma E, NP      Allergies    Patient has no known allergies.    Review of Systems   Review of Systems  Constitutional:  Negative for fatigue and fever.  Respiratory:  Negative for cough.   Cardiovascular:  Negative for chest pain and leg swelling.  Musculoskeletal:  Positive for neck stiffness. Negative for gait problem and joint swelling.  Neurological:  Positive for numbness. Negative for weakness.    Physical Exam Updated Vital Signs BP (!) 148/82 (BP Location: Right Arm)   Pulse (!) 101   Temp 98 F (36.7 C) (Oral)   Resp 20   SpO2 91%  Physical Exam Vitals and nursing note reviewed.  HENT:     Head: Normocephalic and atraumatic.  Eyes:     General: No scleral icterus.       Right eye: No discharge.        Left eye: No discharge.  Cardiovascular:     Rate and Rhythm: Normal rate and regular rhythm.  Pulmonary:     Effort: Pulmonary effort is normal.     Breath sounds: Wheezing present.  Musculoskeletal:        General: Normal range of motion.  Skin:    Findings: No rash.  Neurological:     General: No focal deficit present.     Sensory: Sensory deficit present.     Motor: No weakness.     Gait: Gait normal.     Deep Tendon Reflexes: Reflexes normal.     Comments: Patient described numbness as a burning sensation in left foot with a focal area of numbness on the ball of his left foot.     ED Results / Procedures / Treatments   Labs (all labs  ordered are listed, but only abnormal results are displayed) Labs Reviewed - No data to display  EKG None  Radiology No results found.  Procedures Procedures   Medications Ordered in ED Medications - No data to display  ED Course/ Medical Decision Making/ A&P                           Medical Decision Making  This patient presents to the ED for concern of neuropathy of left foot. Differential diagnosis includes lumbar radiculopathy, peripheral neuropathy, stroke, uncontrolled type 2 diabetes, and compartment syndrome  Additional history obtained:  Additional history obtained from patient's wife External records from outside source obtained and reviewed including labs from 11/14/2021 with PCP including A1c of 7.9%    Medicines ordered and prescription drug management:  I have reviewed the patients home medicines and have made adjustments as needed   Problem List / ED Course:  Patient presented with complaints of left leg numbness that has been persistent for several days. He states that the sensation in his foot feels altered at this time. Based on current comorbid type 2 diabetes with A1c at 7.9%, most likely that this is a neuropathy secondary to the diabetes. Advised patient to follow up with PCP for close management of diabetes and tight control of A1c to slow progression of symptoms. Also will send referral to podiatry for diabetic foot management. Patient verbalized understanding and was agreeable to plan.  Final Clinical Impression(s) / ED Diagnoses Final diagnoses:  Neuropathy of left foot    Rx / DC Orders ED Discharge Orders     None         Vladimir Creeks 12/09/21 1927    Noemi Chapel, MD 12/09/21 2049

## 2021-12-09 NOTE — ED Provider Notes (Signed)
Medical screening examination/treatment/procedure(s) were conducted as a shared visit with non-physician practitioner(s) and myself.  I personally evaluated the patient during the encounter.  Clinical Impression:   Final diagnoses:  None    This patient is a very pleasant 56 year old male with a history of hypertension, diabetes, coronary disease, congestive heart failure, he has renal insufficiency and is followed closely by his family doctor last seen on 1 November.  This patient has had some intermittent symptoms over time with an area of numbness around his left lateral heel as well as his ball of the foot on the left foot on the medial aspect.  This comes and goes, he notices that when he walks he feels like he has a hole in his sock when he does not.  He has no weakness of the leg, he has no difficulty with his arms.  This is only on the left foot.  There is no symptoms in the head, cranial nerves, speech.  On my exam the patient is obese but pleasant, no significant edema, normal pulses of both feet, the visual appearance of both feet is totally normal without any breaks in the skin, no redness no abrasions no swelling no tenderness and his sensation seems to be symmetrical and totally normal.  Reflexes are normal at the patellar tendons as well.  Reviewed labs from within the last month, he has his baseline creatinine which has not changed.  He runs about 1.6.  His electrolytes were normal, glucose seems to run a little high and his A1c was about 7.9.  The patient has good follow-up, vital signs are unremarkable, no signs of infection, no signs of vascular insufficiency, possibly peripheral neuropathy  Patient agreeable to follow-up with podiatry     Noemi Chapel, MD 12/09/21 2049

## 2021-12-09 NOTE — ED Triage Notes (Signed)
Pt with left foot pain and numbness. Here to have it checked. Denies pain with ambulation. Pt with different sensation to ball of left foot earlier , denies at present.

## 2021-12-09 NOTE — ED Notes (Signed)
Attempted to do CBG and meter not working

## 2021-12-09 NOTE — Discharge Instructions (Addendum)
You were seen in the ER today for neuropathy of the left foot. We discussed lifestyle modifications such as increasing physical activity, reducing sugar intake, and maintaining consistent follow up with your PCP for diabetes management.  Referral for podiatry consultation sent.

## 2021-12-11 ENCOUNTER — Other Ambulatory Visit: Payer: Self-pay

## 2021-12-12 ENCOUNTER — Other Ambulatory Visit: Payer: Self-pay

## 2021-12-14 ENCOUNTER — Other Ambulatory Visit: Payer: Self-pay

## 2021-12-18 ENCOUNTER — Other Ambulatory Visit: Payer: Self-pay

## 2021-12-18 ENCOUNTER — Emergency Department
Admission: EM | Admit: 2021-12-18 | Discharge: 2021-12-18 | Disposition: A | Discharge status: Home / Self Care | Payer: Self-pay | Attending: Emergency Medicine | Admitting: Emergency Medicine

## 2021-12-18 ENCOUNTER — Encounter: Payer: Self-pay | Admitting: Emergency Medicine

## 2021-12-18 ENCOUNTER — Emergency Department: Payer: Self-pay

## 2021-12-18 DIAGNOSIS — M5416 Radiculopathy, lumbar region: Secondary | ICD-10-CM | POA: Insufficient documentation

## 2021-12-18 MED ORDER — MELOXICAM 7.5 MG PO TABS
7.5000 mg | ORAL_TABLET | Freq: Every day | ORAL | 1 refills | Status: DC
  Filled 2021-12-18: qty 30, 30d supply, fill #0

## 2021-12-18 MED ORDER — MELOXICAM 7.5 MG PO TABS
7.5000 mg | ORAL_TABLET | Freq: Once | ORAL | Status: AC
  Administered 2021-12-18: 7.5 mg via ORAL
  Filled 2021-12-18: qty 1

## 2021-12-18 MED ORDER — METHOCARBAMOL 500 MG PO TABS
500.0000 mg | ORAL_TABLET | Freq: Four times a day (QID) | ORAL | 1 refills | Status: DC
  Filled 2021-12-18: qty 30, 8d supply, fill #0

## 2021-12-18 NOTE — ED Provider Triage Note (Signed)
Emergency Medicine Provider Triage Evaluation Note  Bradley Mckenzie. , a 56 y.o. male  was evaluated in triage.  Pt complains of intermittent numbness/tingling lower legs in certain ways that he sits, will occasionally get some tingling of the spine into the other parts of his back.  No known injury..  Review of Systems  Positive:  Negative:   Physical Exam  BP 137/87 (BP Location: Right Arm)   Pulse (!) 101   Temp 98 F (36.7 C) (Oral)   Resp 18   SpO2 94%  Gen:   Awake, no distress   Resp:  Normal effort  MSK:   Moves extremities without difficulty  Other:    Medical Decision Making  Medically screening exam initiated at 2:22 PM.  Appropriate orders placed.  Bradley Mckenzie. was informed that the remainder of the evaluation will be completed by another provider, this initial triage assessment does not replace that evaluation, and the importance of remaining in the ED until their evaluation is complete.     Versie Starks, PA-C 12/18/21 1423

## 2021-12-18 NOTE — ED Triage Notes (Signed)
Patient to ED via POV for numbness/tingling on left arm, leg, and foot that has been intermittent for the past 2 weeks. Patient states no new injury. Recently seen at [facility] for foot numbness.

## 2021-12-18 NOTE — ED Provider Notes (Signed)
[facility] Provider Note  Patient Contact: 4:13 PM (approximate)   History   Numbness   HPI  Bradley Mckenzie. is a 56 y.o. male who presents the emergency department complaining of lower back pain radiating down the left leg.  Patient has a history of same in the past requiring anti-inflammatories and muscle relaxers by review of patient's chart.  Patient cannot remember medications but has had combinations of NSAIDs such as ibuprofen, meloxicam, Naprosyn as well as muscle relaxer such as Robaxin or Flexeril.  Patient states no trauma.  No bowel or bladder incontinence, saddle anesthesia or paresthesias.  Pain/numbness started in the left foot, now is involving the entire left lower extremity.  Patient does have a history of mild chronic kidney disease but has been on NSAIDs multiple times in the past.  No other symptoms or complaints at this time.     Physical Exam   Triage Vital Signs: ED Triage Vitals  Enc Vitals Group     BP 12/18/21 1418 137/87     Pulse Rate 12/18/21 1418 (!) 101     Resp 12/18/21 1418 18     Temp 12/18/21 1418 98 F (36.7 C)     Temp Source 12/18/21 1418 Oral     SpO2 12/18/21 1418 94 %     Weight --      Height --      Head Circumference --      Peak Flow --      Pain Score 12/18/21 1422 0     Pain Loc --      Pain Edu? --      Excl. in [place]? --     Most recent vital signs: Vitals:   12/18/21 1418  BP: 137/87  Pulse: (!) 101  Resp: 18  Temp: 98 F (36.7 C)  SpO2: 94%     General: Alert and in no acute distress.  Cardiovascular:  Good peripheral perfusion Respiratory: Normal respiratory effort without tachypnea or retractions. Lungs CTAB.  Musculoskeletal: Full range of motion to all extremities.  Full range of motion to both extremities.  Patient with full range of motion to both lower extremities and sensation pulses intact.  Patient has some mild tenderness along the left paraspinal muscle group extending into  the SI joint.  Palpation of the sciatic notch increases patient's symptoms.  No tenderness or increase symptoms when palpation along the posterior knee. Neurologic:  No gross focal neurologic deficits are appreciated.  Skin:   No rash noted Other:   ED Results / Procedures / Treatments   Labs (all labs ordered are listed, but only abnormal results are displayed) Labs Reviewed - No data to display   EKG     RADIOLOGY  I personally viewed, evaluated, and interpreted these images as part of my medical decision making, as well as reviewing the written report by the radiologist.  ED Provider Interpretation: Increasing degeneration of the lumbar spine when compared with previous imaging from 2018.  No acute findings at this time.  DG Lumbar Spine 2-3 Views  Result Date: 12/18/2021 CLINICAL DATA:  Low back pain radiculopathy. Numbness and tingling in left leg. EXAM: LUMBAR SPINE - 2-3 VIEW COMPARISON:  Lumbar radiograph 04/16/2016, MRI 07/02/2016 FINDINGS: There are 5 non-rib-bearing lumbar vertebra. Normal alignment. Disc space narrowing and anterior spurring L3-L4, L4-L5 and L5-S1 with progression from prior exam. Normal vertebral body heights. Mild lower lumbar facet hypertrophy. No evidence of fracture or focal bone abnormalities. The  sacroiliac joints are congruent. IMPRESSION: 1. Degenerative disc disease at L3-L4, L4-L5 and L5-S1, with progression from 2018. 2. Lower lumbar facet hypertrophy. Electronically Signed   By: Keith Rake M.D.   On: 12/18/2021 15:17    PROCEDURES:  Critical Care performed: No  Procedures   MEDICATIONS ORDERED IN ED: Medications  meloxicam (MOBIC) tablet 7.5 mg (has no administration in time range)     IMPRESSION / MDM / ASSESSMENT AND PLAN / ED COURSE  I reviewed the triage vital signs and the nursing notes.                              Differential diagnosis includes, but is not limited to, lumbar radiculopathy, sciatica, DVT,  neuropathy,   Patient's presentation is most consistent with acute presentation with potential threat to life or bodily function.   Patient's diagnosis is consistent with lumbar radiculopathy.  Patient presents emergency department with pain down the left leg.  Patient has had this in the past, has ongoing mild issues with his back.  Patient has findings consistent with degenerative changes without acute abnormality.  No concerning neuro deficits warranting MRI.  Patient will be treated symptomatically.  Follow-up with neurosurgery if symptoms or not improving.  Concerning signs and symptoms and return precautions are discussed with the patient..  Patient is given ED precautions to return to the ED for any worsening or new symptoms.        FINAL CLINICAL IMPRESSION(S) / ED DIAGNOSES   Final diagnoses:  Lumbar radiculopathy     Rx / DC Orders   ED Discharge Orders          Ordered    meloxicam (MOBIC) 7.5 MG tablet  Daily        12/18/21 1637    methocarbamol (ROBAXIN) 500 MG tablet  4 times daily        12/18/21 1637             Note:  This document was prepared using Dragon voice recognition software and may include unintentional dictation errors.   Brynda Peon 12/18/21 1637    Rada Hay, MD 12/18/21 2042

## 2021-12-21 ENCOUNTER — Other Ambulatory Visit: Payer: Self-pay

## 2021-12-21 MED ORDER — OZEMPIC (2 MG/DOSE) 8 MG/3ML ~~LOC~~ SOPN: PEN_INJECTOR | SUBCUTANEOUS | 3 refills | Status: DC

## 2021-12-25 ENCOUNTER — Other Ambulatory Visit: Payer: Self-pay

## 2022-01-02 ENCOUNTER — Other Ambulatory Visit: Payer: Self-pay

## 2022-01-15 ENCOUNTER — Ambulatory Visit: Payer: Self-pay | Admitting: Orthopedic Surgery

## 2022-01-16 NOTE — Progress Notes (Unsigned)
Referring Physician:  Rada Hay, MD [place],  [place] [address]  Primary Physician:  Langston Reusing, NP  History of Present Illness: 01/17/2022 [name] has a history of heart failure, HTN, hypertensive cardiomyopathy, OSA, COPD, CKD 2, and DM.   He was seen in ED on 12/18/21 for chronic LBP and left leg pain. He went to ED for numbness in left foot/heel.   He has history of chronic LBP x years.   He has some tingling in his lower back with intermittent numbness/tingling  intermittent left leg pain that varies. He has constant numbness in left heel. He has chronic knee and foot issues. No gross weakness in his legs.   Given mobic and robaxin from ED- he doesn't think it helped.   Conservative measures:  Physical therapy: none Multimodal medical therapy including regular antiinflammatories: mobic, robaxin, motrin, naprosyn  Injections: No epidural steroid injections  Past Surgery: No spinal surgery  Marcellius Gae Gallop. has no symptoms of cervical myelopathy.  The symptoms are causing a significant impact on the patient's life.   Review of Systems:  A 10 point review of systems is negative, except for the pertinent positives and negatives detailed in the HPI.  Past Medical History: Past Medical History:  Diagnosis Date   Asthma    Cardiomyopathy ([place]) 2016   a. 01/2014 Echo: EF 25-30%;  b. ? ischemic vs non-ischemic.  He's never had an ischemic eval.   Chronic combined systolic (congestive) and diastolic (congestive) heart failure ([place])    a. 01/2014 Echo: EF 25-30%. b. Echo 03/2015: Improved EF of 40-45%   COPD (chronic obstructive pulmonary disease) ([place])    Depression    Diabetes mellitus without complication ([place]) 8502   started med therapy approx 2018   Hyperlipidemia    Hypertensive cardiomyopathy ([place]) 06/30/2015   Hypertensive heart disease    a. Since his 65's.   Morbid obesity ([place])    Obstructive sleep apnea    a. Has not  used CPAP since ~ 2012.    Past Surgical History: Past Surgical History:  Procedure Laterality Date   HERNIA REPAIR  2004    Allergies: Allergies as of 01/17/2022   (No Known Allergies)    Medications: Outpatient Encounter Medications as of 01/17/2022  Medication Sig   albuterol (PROAIR HFA) 108 (90 Base) MCG/ACT inhaler Inhale 2 puffs into the lungs every 6 (six) hours as needed for wheezing or shortness of breath.   atorvastatin (LIPITOR) 10 MG tablet TAKE ONE TABLET BY MOUTH EVERY DAY   Blood Pressure Monitoring (BLOOD PRESSURE CUFF) MISC USE AS DIRECTED ONCE DAILY AT 12 NOON.   carvedilol (COREG) 12.5 MG tablet Take 1 tablet (12.5 mg total) by mouth 2 (two) times daily with a meal.   cetirizine (ZYRTEC) 10 MG tablet TAKE ONE TABLET BY MOUTH EVERY DAY   dapagliflozin propanediol (FARXIGA) 10 MG TABS tablet TAKE ONE TABLET BY MOUTH EVERY DAY BEFORE BREAKFAST   fluticasone-salmeterol (ADVAIR DISKUS) 500-50 MCG/ACT AEPB Inhale 1 puff into the lungs in the morning and at bedtime.   fluticasone-salmeterol (WIXELA INHUB) 500-50 MCG/ACT AEPB Inhale 1 puff into the lungs twice a day in the morning and at bedtime.   glucose blood (RIGHTEST GS550 BLOOD GLUCOSE) test strip USE AS DIRECTED.   hydrALAZINE (APRESOLINE) 25 MG tablet TAKE ONE TABLET BY MOUTH 2 TIMES A DAY   insulin glargine (LANTUS) 100 UNIT/ML Solostar Pen Inject 31 Units into the skin once daily.  insulin glargine (LANTUS) 100 UNIT/ML Solostar Pen Inject 31 units into the skin once daily   insulin glargine (LANTUS) 100 UNIT/ML Solostar Pen Inject 31 units into the skin once daily   Insulin Pen Needle 32G X 4 MM MISC Use as directed   ipratropium-albuterol (DUONEB) 0.5-2.5 (3) MG/3ML SOLN INHALE CONTENTS OF ONE VIAL (3ML) USING NEBULIZER ONCE EVERY 4 HOURS AS NEEDED   losartan (COZAAR) 100 MG tablet TAKE ONE TABLET BY MOUTH EVERY DAY   metFORMIN (GLUCOPHAGE) 500 MG tablet TAKE ONE TABLET BY MOUTH 2 TIMES A DAY WITH A MEAL.    montelukast (SINGULAIR) 10 MG tablet TAKE ONE TABLET BY MOUTH AT BEDTIME   potassium chloride SA (KLOR-CON M) 20 MEQ tablet TAKE ONE TABLET BY MOUTH 2 TIMES A DAY   Rightest GL300 Lancets MISC USE AS DIRECTED.   Semaglutide, 1 MG/DOSE, (OZEMPIC, 1 MG/DOSE,) 4 MG/3ML SOPN Inject 2 mg (2 x 1 mg injections) into the skin once a week.   Semaglutide, 2 MG/DOSE, (OZEMPIC, 2 MG/DOSE,) 8 MG/3ML SOPN Inject 2mg  into the skin once a week   Semaglutide, 2 MG/DOSE, (OZEMPIC, 2 MG/DOSE,) 8 MG/3ML SOPN Inject 2mg  into the skin once a week   torsemide (DEMADEX) 20 MG tablet Take 2 tablets (40 mg total) by mouth once daily. May take additional 1 tablet (20mg  total) in the evening if needed.   Vitamin D, Ergocalciferol, (DRISDOL) 1.25 MG (50000 UNIT) CAPS capsule Take 1 capsule (50,000 Units total) by mouth every 7 (seven) days.   [DISCONTINUED] benzonatate (TESSALON PERLES) 100 MG capsule Take 1 capsule (100 mg total) by mouth 3 (three) times daily as needed. (Patient not taking: Reported on 01/17/2022)   [DISCONTINUED] Melatonin 1 MG CAPS Take 1 capsule (1 mg total) by mouth at bedtime. (Patient not taking: Reported on 01/17/2022)   [DISCONTINUED] meloxicam (MOBIC) 7.5 MG tablet Take 1 tablet (7.5 mg total) by mouth daily. (Patient not taking: Reported on 01/17/2022)   [DISCONTINUED] methocarbamol (ROBAXIN) 500 MG tablet Take 1 tablet (500 mg total) by mouth 4 (four) times daily. (Patient not taking: Reported on 01/17/2022)   No facility-administered encounter medications on file as of 01/17/2022.    Social History: Social History   Tobacco Use   Smoking status: Never   Smokeless tobacco: Never   Tobacco comments:    Quit in his 78's.  Vaping Use   Vaping Use: Never used  Substance Use Topics   Alcohol use: Not Currently    Comment: rarely alcohol use at special events   Drug use: Not Currently    Comment: Used marijuana in his teens.    Family Medical History: Family History  Problem Relation Age of Onset    Heart disease Mother        alive & well.   Heart attack Father 32       died @ 20 of cancer.   Diabetes Father    Cancer Father        "in his stomach"   Cancer Sister    Cancer Paternal Uncle        strong FH malignancy multiple relavies     Physical Examination: Vitals:   01/17/22 1109  BP: 134/84    General: Patient is well developed, well nourished, calm, collected, and in no apparent distress. Attention to examination is appropriate.  Respiratory: Patient is breathing without any difficulty.   NEUROLOGICAL:     Awake, alert, oriented to person, place, and time.  Speech is clear and fluent.  Fund of knowledge is appropriate.   Cranial Nerves: Pupils equal round and reactive to light.  Facial tone is symmetric.    ROM of spine:  Limited ROM of lumbar spine with no pain  No abnormal lesions on exposed skin.   Strength: Side Biceps Triceps Deltoid Interossei Grip Wrist Ext. Wrist Flex.  R 5 5 5 5 5 5 5   L 5 5 5 5 5 5 5    Side Iliopsoas Quads Hamstring PF DF EHL  R 5 5 5 5 5 5   L 5 5 5 5 5 5    Reflexes are 2+ and symmetric at the biceps, triceps, brachioradialis, patella and achilles.   Hoffman's is absent.  Clonus is not present.   Bilateral upper and lower extremity sensation is intact to light touch, but he has some numbness left heel/lateral ankle.   Gait is normal.     Medical Decision Making  Imaging: Lumbar xrays dated 12/18/21:  FINDINGS: There are 5 non-rib-bearing lumbar vertebra. Normal alignment. Disc space narrowing and anterior spurring L3-L4, L4-L5 and L5-S1 with progression from prior exam. Normal vertebral body heights. Mild lower lumbar facet hypertrophy. No evidence of fracture or focal bone abnormalities. The sacroiliac joints are congruent.   IMPRESSION: 1. Degenerative disc disease at L3-L4, L4-L5 and L5-S1, with progression from 2018. 2. Lower lumbar facet hypertrophy.     Electronically Signed   By: Keith Rake M.D.    On: 12/18/2021 15:17        I have personally reviewed the images and agree with the above interpretation.  Assessment and Plan: Mr. Halt is a pleasant 57 y.o. male with He has history of chronic LBP x years.   He has some tingling in his lower back with intermittent numbness/tingling  intermittent left leg pain that varies. He has constant numbness in left heel. Numbness/tingling is a new complaint.   He has known lumbar spondylosis L3-S1 with DDD. He has history of DM, but does not have known diabetic neuropathy.   Treatment options discussed with patient and following plan made:   - MRI of lumbar spine to further evaluate chronic LBP and left lumbar radiculopathy.  - May also consider EMG of bilateral lower extremities at some point.  - Depending on results of MRI, may consider PT and/or injections. He hates needles so is not sure he'd be interested in injections.  - Will schedule f/u for him to see me in clinic to review his MRI once I have the results back.   I spent a total of 25 minutes in face-to-face and non-face-to-face activities related to this patient's care today including review of outside records, review of imaging, review of symptoms, physical exam, discussion of differential diagnosis, discussion of treatment options, and documentation.   Thank you for involving me in the care of this patient.   Geronimo Boot PA-C Dept. of Neurosurgery

## 2022-01-17 ENCOUNTER — Other Ambulatory Visit: Payer: Self-pay

## 2022-01-17 ENCOUNTER — Encounter: Payer: Self-pay | Admitting: Orthopedic Surgery

## 2022-01-17 ENCOUNTER — Ambulatory Visit (INDEPENDENT_AMBULATORY_CARE_PROVIDER_SITE_OTHER): Payer: Commercial Managed Care - HMO | Admitting: Orthopedic Surgery

## 2022-01-17 VITALS — BP 134/84 | Ht 69.0 in | Wt 278.6 lb

## 2022-01-17 DIAGNOSIS — M5136 Other intervertebral disc degeneration, lumbar region: Secondary | ICD-10-CM

## 2022-01-17 DIAGNOSIS — M47816 Spondylosis without myelopathy or radiculopathy, lumbar region: Secondary | ICD-10-CM

## 2022-01-17 DIAGNOSIS — M4726 Other spondylosis with radiculopathy, lumbar region: Secondary | ICD-10-CM | POA: Diagnosis not present

## 2022-01-17 DIAGNOSIS — M5416 Radiculopathy, lumbar region: Secondary | ICD-10-CM

## 2022-01-17 NOTE — Patient Instructions (Signed)
It was so nice to see you today.   Your lower back xrays showed some arthritis (wear and tear) and I think this may be causing your pain.   I want to get an MRI of your lower back to look into things further. We will get this approved and [facility] will call you to set this up.   Once I get the MRI results, we will call you to schedule a follow up with me to review them.   Please do not hesitate to call if you have any questions or concerns. You can also message me in [place].   Geronimo Boot PA-C [phone]

## 2022-01-18 ENCOUNTER — Other Ambulatory Visit: Payer: Self-pay

## 2022-01-18 MED ORDER — GABAPENTIN 300 MG PO CAPS
300.0000 mg | ORAL_CAPSULE | Freq: Every day | ORAL | 0 refills | Status: DC
  Filled 2022-01-18: qty 30, 30d supply, fill #0

## 2022-01-28 NOTE — Progress Notes (Unsigned)
Patient ID: Bradley Mckenzie., male    DOB: 1965-09-13, 57 y.o.   MRN: [id number]  HPI  [name] is a 57 y/o male who has a history of diabetes, HTN, obstructive sleep apnea, depression, hyperlipidemia, morbid obesity and heart failure with a reduced ejection fraction. Remote tobacco exposure.   Echo report from 10/28/2018 reviewed and showed an EF of 60-65% along with mildly elevated PA pressure. Echo done 09/02/15 with an EF of 30-35%, mild mitral regurgitation and no aortic stenosis. EF has declined from 40-45% in March 2017 but he had been inconsistent taking his medications & not using his CPAP during this time.  Was in the ED 12/18/21 due to LBP radiating down left leg. Was in the ED 12/09/21 due to left foot numbness.   Returns today for a follow-up visit with a chief complaint of minimal fatigue with moderate exertion. Describes this as chronic. Has associated cough, SOB, wheezing, palpitations, light-headedness & weakness along with this. Denies any difficulty sleeping, abdominal distention, pedal edema, chest pain or weight gain.   Says that he thinks his cough, wheezing etc is due to his CPAP. He says that he cleans it as recommended but when he takes it off, he feels very congested.   Does QOD torsemide dosing of 40mg / 20mg .  Is supposed to be having a back MRI done due to his leg/ foot pain.   Past Surgical History:  Procedure Laterality Date   HERNIA REPAIR  2004   Family History  Problem Relation Age of Onset   Heart disease Mother        alive & well.   Heart attack Father 67       died @ 36 of cancer.   Diabetes Father    Cancer Father        "in his stomach"   Cancer Sister    Cancer Paternal Uncle        strong FH malignancy multiple relavies    Social History   Tobacco Use   Smoking status: Never   Smokeless tobacco: Never   Tobacco comments:    Quit in his 29's.  Substance Use Topics   Alcohol use: Not Currently    Comment: rarely alcohol use at  special events   Past Medical History:  Diagnosis Date   Asthma    Cardiomyopathy ([place]) 2016   a. 01/2014 Echo: EF 25-30%;  b. ? ischemic vs non-ischemic.  He's never had an ischemic eval.   Chronic combined systolic (congestive) and diastolic (congestive) heart failure ([place])    a. 01/2014 Echo: EF 25-30%. b. Echo 03/2015: Improved EF of 40-45%   COPD (chronic obstructive pulmonary disease) ([place])    Depression    Diabetes mellitus without complication ([place]) 1517   started med therapy approx 2018   Hyperlipidemia    Hypertensive cardiomyopathy ([place]) 06/30/2015   Hypertensive heart disease    a. Since his 33's.   Morbid obesity ([place])    Obstructive sleep apnea    a. Has not used CPAP since ~ 2012.   Past Surgical History:  Procedure Laterality Date   HERNIA REPAIR  2004   Family History  Problem Relation Age of Onset   Heart disease Mother        alive & well.   Heart attack Father 13       died @ 17 of cancer.   Diabetes Father    Cancer Father        "in his stomach"  Cancer Sister    Cancer Paternal Uncle        strong FH malignancy multiple relavies    Social History   Tobacco Use   Smoking status: Never   Smokeless tobacco: Never   Tobacco comments:    Quit in his 52's.  Substance Use Topics   Alcohol use: Not Currently    Comment: rarely alcohol use at special events   No Known Allergies  Prior to Admission medications   Medication Sig Start Date End Date Taking? Authorizing Provider  albuterol (PROAIR HFA) 108 (90 Base) MCG/ACT inhaler Inhale 2 puffs into the lungs every 6 (six) hours as needed for wheezing or shortness of breath. 02/08/21  Yes Iloabachie, Chioma E, NP  atorvastatin (LIPITOR) 10 MG tablet TAKE ONE TABLET BY MOUTH EVERY DAY 11/28/21 11/28/22 Yes Iloabachie, Chioma E, NP  carvedilol (COREG) 12.5 MG tablet Take 1 tablet (12.5 mg total) by mouth 2 (two) times daily with a meal. 11/28/21  Yes Iloabachie, Chioma E, NP  cetirizine (ZYRTEC) 10 MG  tablet TAKE ONE TABLET BY MOUTH EVERY DAY 07/24/21 07/24/22 Yes Iloabachie, Chioma E, NP  dapagliflozin propanediol (FARXIGA) 10 MG TABS tablet TAKE ONE TABLET BY MOUTH EVERY DAY BEFORE BREAKFAST 01/30/21 01/30/22 Yes Shila Kruczek A, FNP  fluticasone-salmeterol (WIXELA INHUB) 500-50 MCG/ACT AEPB Inhale 1 puff into the lungs twice a day in the morning and at bedtime. 12/04/21  Yes Iloabachie, Chioma E, NP  gabapentin (NEURONTIN) 300 MG capsule Take 1 capsule (300 mg total) by mouth daily. 01/18/22  Yes   glucose blood (RIGHTEST GS550 BLOOD GLUCOSE) test strip USE AS DIRECTED. 11/20/21  Yes Iloabachie, Chioma E, NP  hydrALAZINE (APRESOLINE) 25 MG tablet TAKE ONE TABLET BY MOUTH 2 TIMES A DAY 08/07/21 04/21/22 Yes Iloabachie, Chioma E, NP  insulin glargine (LANTUS) 100 UNIT/ML Solostar Pen Inject 31 Units into the skin once daily. 02/21/21 02/25/22 Yes Iloabachie, Chioma E, NP  Insulin Pen Needle 32G X 4 MM MISC Use as directed 07/18/21  Yes Iloabachie, Chioma E, NP  ipratropium-albuterol (DUONEB) 0.5-2.5 (3) MG/3ML SOLN INHALE CONTENTS OF ONE VIAL (3ML) USING NEBULIZER ONCE EVERY 4 HOURS AS NEEDED 07/10/21 07/10/22 Yes Iloabachie, Chioma E, NP  losartan (COZAAR) 100 MG tablet TAKE ONE TABLET BY MOUTH EVERY DAY 11/28/21 11/28/22 Yes Iloabachie, Chioma E, NP  metFORMIN (GLUCOPHAGE) 500 MG tablet TAKE ONE TABLET BY MOUTH 2 TIMES A DAY WITH A MEAL. 08/07/21 08/07/22 Yes Iloabachie, Chioma E, NP  montelukast (SINGULAIR) 10 MG tablet TAKE ONE TABLET BY MOUTH AT BEDTIME 09/06/21 09/06/22 Yes Iloabachie, Chioma E, NP  potassium chloride SA (KLOR-CON M) 20 MEQ tablet TAKE ONE TABLET BY MOUTH 2 TIMES A DAY 11/28/21 11/28/22 Yes Iloabachie, Chioma E, NP  Rightest GL300 Lancets MISC USE AS DIRECTED. 11/20/21  Yes Iloabachie, Chioma E, NP  Semaglutide, 2 MG/DOSE, (OZEMPIC, 2 MG/DOSE,) 8 MG/3ML SOPN Inject 2mg  into the skin once a week 08/14/21  Yes Iloabachie, Chioma E, NP  torsemide (DEMADEX) 20 MG tablet Take 2 tablets (40 mg total) by  mouth once daily. May take additional 1 tablet (20mg  total) in the evening if needed. 11/19/21  Yes Rishaan Gunner, Otila Kluver A, FNP  Vitamin D, Ergocalciferol, (DRISDOL) 1.25 MG (50000 UNIT) CAPS capsule Take 1 capsule (50,000 Units total) by mouth every 7 (seven) days. 11/28/21  Yes Iloabachie, Chioma E, NP  Blood Pressure Monitoring (BLOOD PRESSURE CUFF) MISC USE AS DIRECTED ONCE DAILY AT 12 NOON. Patient not taking: Reported on 01/29/2022 11/09/20   Niel Hummer  A, MD   Review of Systems  Constitutional:  Positive for fatigue. Negative for appetite change.  HENT:  Positive for postnasal drip. Negative for congestion and sore throat.   Eyes: Negative.   Respiratory:  Positive for cough, shortness of breath (with little exertion) and wheezing. Negative for chest tightness.   Cardiovascular:  Positive for palpitations (sometimes). Negative for chest pain and leg swelling.  Gastrointestinal:  Negative for abdominal distention and abdominal pain.  Endocrine: Negative.   Genitourinary: Negative.   Musculoskeletal:  Positive for arthralgias (left heel pain). Negative for back pain and neck pain.  Skin: Negative.   Allergic/Immunologic: Negative.   Neurological:  Positive for weakness ("when coughing") and light-headedness (when coughing). Negative for dizziness and headaches.  Hematological:  Negative for adenopathy. Does not bruise/bleed easily.  Psychiatric/Behavioral:  Negative for dysphoric mood and sleep disturbance. The patient is not nervous/anxious.    Vitals:   01/29/22 0930  BP: (!) 157/92  Pulse: (!) 108  Resp: 20  SpO2: 95%  Weight: 276 lb 6 oz (125.4 kg)   Wt Readings from Last 3 Encounters:  01/29/22 276 lb 6 oz (125.4 kg)  01/17/22 278 lb 9.6 oz (126.4 kg)  12/18/21 286 lb 9.6 oz (130 kg)   Lab Results  Component Value Date   CREATININE 1.65 (H) 11/14/2021   CREATININE 1.47 (H) 08/08/2021   CREATININE 1.58 (H) 05/09/2021   Physical Exam Vitals and nursing note reviewed.   Constitutional:      Appearance: He is well-developed.  HENT:     Head: Normocephalic and atraumatic.  Neck:     Vascular: No JVD.  Cardiovascular:     Rate and Rhythm: Regular rhythm. Tachycardia present.  Pulmonary:     Effort: Pulmonary effort is normal. No tachypnea.     Breath sounds: Wheezing (expiratory throughout all lung fields) present. No rales.  Abdominal:     General: There is no distension.     Palpations: Abdomen is soft.     Tenderness: There is no abdominal tenderness.  Musculoskeletal:        General: No tenderness.     Cervical back: Neck supple.     Left knee: Normal.     Right lower leg: No edema.     Left lower leg: No edema.  Skin:    General: Skin is warm and dry.  Neurological:     Mental Status: He is alert and oriented to person, place, and time.  Psychiatric:        Behavior: Behavior normal.    Assessment & Plan:  1: Chronic heart failure with preserved ejection fraction-  - NYHA Class II - euvolemic today - weighing daily and he was reminded to call for an overnight weight gain of >2 pounds or a weekly weight gain of >5 pounds - weight down 4 pounds from last visit here 6 months ago - trying to eat low sodium foods when he can but says it's difficult at times due to finances - says that he's working on insurance and wants to hold off ordering echo because he can't afford to pay for it and doesn't want to create a large bill (needs back MRI done) - PharmD reconciled meds w/ patient   2: HTN- - BP 157/92 - saw PCP at [facility] on 11/28/21 - BMP on 11/14/21 reviewed and shows sodium 138, potassium 3.9, creatinine 1.65 and GFR 49  3: Diabetes- - home glucose today was 125 - A1c on 11/14/21  was 7.9%  4: Asthma- - on advair and albuterol rescue inhaler - has persistent coughing spells  - wearing CPAP nightly   Patient did not bring his medications nor a list. Each medication was verbally reviewed with the patient and he was  encouraged to bring the bottles to every visit to confirm accuracy of list  Return in 3 months, sooner if needed.

## 2022-01-29 ENCOUNTER — Encounter: Payer: Self-pay | Admitting: Family

## 2022-01-29 ENCOUNTER — Encounter: Payer: Self-pay | Admitting: Pharmacy Technician

## 2022-01-29 ENCOUNTER — Ambulatory Visit: Payer: Commercial Managed Care - HMO | Attending: Family | Admitting: Family

## 2022-01-29 VITALS — BP 157/92 | HR 108 | Resp 20 | Wt 276.4 lb

## 2022-01-29 DIAGNOSIS — Z79899 Other long term (current) drug therapy: Secondary | ICD-10-CM | POA: Insufficient documentation

## 2022-01-29 DIAGNOSIS — Z794 Long term (current) use of insulin: Secondary | ICD-10-CM | POA: Insufficient documentation

## 2022-01-29 DIAGNOSIS — Z7985 Long-term (current) use of injectable non-insulin antidiabetic drugs: Secondary | ICD-10-CM | POA: Insufficient documentation

## 2022-01-29 DIAGNOSIS — I10 Essential (primary) hypertension: Secondary | ICD-10-CM

## 2022-01-29 DIAGNOSIS — Z7984 Long term (current) use of oral hypoglycemic drugs: Secondary | ICD-10-CM | POA: Diagnosis not present

## 2022-01-29 DIAGNOSIS — M545 Low back pain, unspecified: Secondary | ICD-10-CM | POA: Diagnosis not present

## 2022-01-29 DIAGNOSIS — Z7951 Long term (current) use of inhaled steroids: Secondary | ICD-10-CM | POA: Diagnosis not present

## 2022-01-29 DIAGNOSIS — Z6841 Body Mass Index (BMI) 40.0 and over, adult: Secondary | ICD-10-CM | POA: Insufficient documentation

## 2022-01-29 DIAGNOSIS — Z7722 Contact with and (suspected) exposure to environmental tobacco smoke (acute) (chronic): Secondary | ICD-10-CM | POA: Diagnosis not present

## 2022-01-29 DIAGNOSIS — I110 Hypertensive heart disease with heart failure: Secondary | ICD-10-CM | POA: Diagnosis not present

## 2022-01-29 DIAGNOSIS — G4733 Obstructive sleep apnea (adult) (pediatric): Secondary | ICD-10-CM | POA: Insufficient documentation

## 2022-01-29 DIAGNOSIS — M79673 Pain in unspecified foot: Secondary | ICD-10-CM | POA: Diagnosis not present

## 2022-01-29 DIAGNOSIS — E785 Hyperlipidemia, unspecified: Secondary | ICD-10-CM | POA: Insufficient documentation

## 2022-01-29 DIAGNOSIS — I5042 Chronic combined systolic (congestive) and diastolic (congestive) heart failure: Secondary | ICD-10-CM | POA: Diagnosis present

## 2022-01-29 DIAGNOSIS — R002 Palpitations: Secondary | ICD-10-CM | POA: Insufficient documentation

## 2022-01-29 DIAGNOSIS — J4489 Other specified chronic obstructive pulmonary disease: Secondary | ICD-10-CM | POA: Insufficient documentation

## 2022-01-29 DIAGNOSIS — J45909 Unspecified asthma, uncomplicated: Secondary | ICD-10-CM

## 2022-01-29 DIAGNOSIS — I5032 Chronic diastolic (congestive) heart failure: Secondary | ICD-10-CM

## 2022-01-29 DIAGNOSIS — E119 Type 2 diabetes mellitus without complications: Secondary | ICD-10-CM | POA: Insufficient documentation

## 2022-01-29 NOTE — Progress Notes (Signed)
[place] - PHARMACIST COUNSELING NOTE  Guideline-Directed Medical Therapy/Evidence Based Medicine  ACE/ARB/ARNI: Losartan 100 mg daily Beta Blocker: Carvedilol 12.5 mg twice daily Aldosterone Antagonist:  None Diuretic: Torsemide 40 mg on one day, then Torsemide 20 mg the next SGLT2i: Dapagliflozin 10 mg daily  Adherence Assessment  Do you ever forget to take your medication? [] Yes [x] No  Do you ever skip doses due to side effects? [] Yes [x] No  Do you have trouble affording your medicines? [x] Yes [] No  Are you ever unable to pick up your medication due to transportation difficulties? [] Yes [x] No  Do you ever stop taking your medications because you don't believe they are helping? [] Yes [x] No  Do you check your weight daily? [x] Yes [] No   Adherence strategy: Pill box  Barriers to obtaining medications: Able to receive medications through Med Management. He goes to [facility]  Vital signs: HR 108, BP 157/92, weight (pounds) 276 ECHO: Date 10/2018, EF 60-65%     Latest Ref Rng & Units 11/14/2021   11:08 AM 08/08/2021   11:54 AM 05/09/2021   11:42 AM  BMP  Glucose 70 - 99 mg/dL 290  166  147   BUN 6 - 24 mg/dL 18  12  14    Creatinine 0.76 - 1.27 mg/dL 1.65  1.47  1.58   BUN/Creat Ratio 9 - 20 11  8  9    Sodium 134 - 144 mmol/L 138  141  141   Potassium 3.5 - 5.2 mmol/L 3.9  4.1  4.2   Chloride 96 - 106 mmol/L 96  102  102   CO2 20 - 29 mmol/L 22  20  24    Calcium 8.7 - 10.2 mg/dL 10.0  9.5  10.0     Past Medical History:  Diagnosis Date   Asthma    Cardiomyopathy ([place]) 2016   a. 01/2014 Echo: EF 25-30%;  b. ? ischemic vs non-ischemic.  He's never had an ischemic eval.   Chronic combined systolic (congestive) and diastolic (congestive) heart failure ([place])    a. 01/2014 Echo: EF 25-30%. b. Echo 03/2015: Improved EF of 40-45%   COPD (chronic obstructive pulmonary disease) ([place])    Depression    Diabetes mellitus  without complication ([place]) 3716   started med therapy approx 2018   Hyperlipidemia    Hypertensive cardiomyopathy ([place]) 06/30/2015   Hypertensive heart disease    a. Since his 18's.   Morbid obesity ([place])    Obstructive sleep apnea    a. Has not used CPAP since ~ 2012.    ASSESSMENT 57 year old male with PMH HTN, T2DM, CKD, HLD who presents to the HF clinic for follow-up. Most recent ECHO (EF 60-65%) was back in 10/2018, as patient is not able to pay for a new one. Waiting on approval for [facility] to cover the ECHO costs.  Patient weighs himself daily but is not able to check his home blood pressures because his cuff is broken.  Recent ED Visit (past 6 months): Date - 11/2021, CC - Left foot neuropathy Date - 12/2021, CC - Lumbar radiculopathy  PLAN CHF/HTN Repeat new ECHO as soon as able Continue losartan 100 mg daily, carvedilol 12.5 mg twice daily, torsemide 40 mg one day then 20 mg the next, hydralazine 25 mg twice daily, and potassium chloride 20 mEq BID  T2DM 11/2021 A1c 7.9% Continue metformin 500 mg twice daily, Ozempic 2 mg [place] weekly, and Lantus 31 units daily  HLD 11/2021 LDL 99 Continue atorvastatin 10 mg daily   Time spent: 15 minutes  Will M. Ouida Sills, PharmD PGY-1 Pharmacy Resident 01/29/2022 12:27 PM   Current Outpatient Medications:    albuterol (PROAIR HFA) 108 (90 Base) MCG/ACT inhaler, Inhale 2 puffs into the lungs every 6 (six) hours as needed for wheezing or shortness of breath., Disp: 8.5 g, Rfl: 4   atorvastatin (LIPITOR) 10 MG tablet, TAKE ONE TABLET BY MOUTH EVERY DAY, Disp: 90 tablet, Rfl: 4   Blood Pressure Monitoring (BLOOD PRESSURE CUFF) MISC, USE AS DIRECTED ONCE DAILY AT 12 NOON. (Patient not taking: Reported on 01/29/2022), Disp: 1 each, Rfl: 0   carvedilol (COREG) 12.5 MG tablet, Take 1 tablet (12.5 mg total) by mouth 2 (two) times daily with a meal., Disp: 180 tablet, Rfl: 3   cetirizine (ZYRTEC) 10 MG tablet, TAKE ONE TABLET BY MOUTH  EVERY DAY, Disp: 90 tablet, Rfl: 1   dapagliflozin propanediol (FARXIGA) 10 MG TABS tablet, TAKE ONE TABLET BY MOUTH EVERY DAY BEFORE BREAKFAST, Disp: 90 tablet, Rfl: 3   fluticasone-salmeterol (WIXELA INHUB) 500-50 MCG/ACT AEPB, Inhale 1 puff into the lungs twice a day in the morning and at bedtime., Disp: 180 each, Rfl: 3   gabapentin (NEURONTIN) 300 MG capsule, Take 1 capsule (300 mg total) by mouth daily., Disp: 30 capsule, Rfl: 0   glucose blood (RIGHTEST GS550 BLOOD GLUCOSE) test strip, USE AS DIRECTED., Disp: 50 strip, Rfl: 0   hydrALAZINE (APRESOLINE) 25 MG tablet, TAKE ONE TABLET BY MOUTH 2 TIMES A DAY, Disp: 180 tablet, Rfl: 1   insulin glargine (LANTUS) 100 UNIT/ML Solostar Pen, Inject 31 Units into the skin once daily., Disp: 15 mL, Rfl: 4   Insulin Pen Needle 32G X 4 MM MISC, Use as directed, Disp: 100 each, Rfl: 11   ipratropium-albuterol (DUONEB) 0.5-2.5 (3) MG/3ML SOLN, INHALE CONTENTS OF ONE VIAL (3ML) USING NEBULIZER ONCE EVERY 4 HOURS AS NEEDED, Disp: 180 mL, Rfl: 1   losartan (COZAAR) 100 MG tablet, TAKE ONE TABLET BY MOUTH EVERY DAY, Disp: 90 tablet, Rfl: 4   metFORMIN (GLUCOPHAGE) 500 MG tablet, TAKE ONE TABLET BY MOUTH 2 TIMES A DAY WITH A MEAL., Disp: 180 tablet, Rfl: 1   montelukast (SINGULAIR) 10 MG tablet, TAKE ONE TABLET BY MOUTH AT BEDTIME, Disp: 30 tablet, Rfl: 3   potassium chloride SA (KLOR-CON M) 20 MEQ tablet, TAKE ONE TABLET BY MOUTH 2 TIMES A DAY, Disp: 180 tablet, Rfl: 4   Rightest GL300 Lancets MISC, USE AS DIRECTED., Disp: 100 each, Rfl: 0   Semaglutide, 2 MG/DOSE, (OZEMPIC, 2 MG/DOSE,) 8 MG/3ML SOPN, Inject 2mg  into the skin once a week, Disp: 12 mL, Rfl: 3   torsemide (DEMADEX) 20 MG tablet, Take 2 tablets (40 mg total) by mouth once daily. May take additional 1 tablet (20mg  total) in the evening if needed., Disp: 270 tablet, Rfl: 3   Vitamin D, Ergocalciferol, (DRISDOL) 1.25 MG (50000 UNIT) CAPS capsule, Take 1 capsule (50,000 Units total) by mouth every 7  (seven) days., Disp: 12 capsule, Rfl: 0   DRUGS TO CAUTION IN HEART FAILURE  Drug or Class Mechanism  Analgesics NSAIDs COX-2 inhibitors Glucocorticoids  Sodium and water retention, increased systemic vascular resistance, decreased response to diuretics   Diabetes Medications Metformin Thiazolidinediones Rosiglitazone (Avandia) Pioglitazone (Actos) DPP4 Inhibitors Saxagliptin (Onglyza) Sitagliptin (Januvia)   Lactic acidosis Possible calcium channel blockade   Unknown  Antiarrhythmics Class I  Flecainide Disopyramide Class III Sotalol Other Dronedarone  Negative inotrope, proarrhythmic  Proarrhythmic, beta blockade  Negative inotrope  Antihypertensives Alpha Blockers Doxazosin Calcium Channel Blockers Diltiazem Verapamil Nifedipine Central Alpha Adrenergics Moxonidine Peripheral Vasodilators Minoxidil  Increases renin and aldosterone  Negative inotrope    Possible sympathetic withdrawal  Unknown  Anti-infective Itraconazole Amphotericin B  Negative inotrope Unknown  Hematologic Anagrelide Cilostazol   Possible inhibition of PD IV Inhibition of PD III causing arrhythmias  Neurologic/Psychiatric Stimulants Anti-Seizure Drugs Carbamazepine Pregabalin Antidepressants Tricyclics Citalopram Parkinsons Bromocriptine Pergolide Pramipexole Antipsychotics Clozapine Antimigraine Ergotamine Methysergide Appetite suppressants Bipolar Lithium  Peripheral alpha and beta agonist activity  Negative inotrope and chronotrope Calcium channel blockade  Negative inotrope, proarrhythmic Dose-dependent QT prolongation  Excessive serotonin activity/valvular damage Excessive serotonin activity/valvular damage Unknown  IgE mediated hypersensitivy, calcium channel blockade  Excessive serotonin activity/valvular damage Excessive serotonin activity/valvular damage Valvular damage  Direct myofibrillar degeneration, adrenergic stimulation   Antimalarials Chloroquine Hydroxychloroquine Intracellular inhibition of lysosomal enzymes  Urologic Agents Alpha Blockers Doxazosin Prazosin Tamsulosin Terazosin  Increased renin and aldosterone  Adapted from Page Carleene Overlie, et al. "Drugs That May Cause or Exacerbate Heart Failure: A Scientific Statement from the American Heart  Association." Circulation 2016; 134:e32-e69. DOI: 10.1161/CIR.0000000000000426   MEDICATION ADHERENCES TIPS AND STRATEGIES Taking medication as prescribed improves patient outcomes in heart failure (reduces hospitalizations, improves symptoms, increases survival) Side effects of medications can be managed by decreasing doses, switching agents, stopping drugs, or adding additional therapy. Please let someone in the [facility] know if you have having bothersome side effects so we can modify your regimen. Do not alter your medication regimen without talking to [place].  Medication reminders can help patients remember to take drugs on time. If you are missing or forgetting doses you can try linking behaviors, using pill boxes, or an electronic reminder like an alarm on your phone or an app. Some people can also get automated phone calls as medication reminders.

## 2022-01-29 NOTE — Patient Instructions (Addendum)
Continue weighing daily and call for an overnight weight gain of 3 pounds or more or a weekly weight gain of more than 5 pounds  If you have voicemail, please make sure your mailbox is cleaned out so that we may leave a message and please make sure to listen to any voicemails.    If you receive a satisfaction survey regarding the Heart Failure Clinic, please take the time to fill it out. This way we can continue to provide excellent care and make any changes that need to be made.     

## 2022-02-05 ENCOUNTER — Other Ambulatory Visit: Payer: Self-pay | Admitting: Orthopedic Surgery

## 2022-02-05 DIAGNOSIS — M5136 Other intervertebral disc degeneration, lumbar region: Secondary | ICD-10-CM

## 2022-02-05 DIAGNOSIS — M5416 Radiculopathy, lumbar region: Secondary | ICD-10-CM

## 2022-02-05 DIAGNOSIS — M47816 Spondylosis without myelopathy or radiculopathy, lumbar region: Secondary | ICD-10-CM

## 2022-02-12 ENCOUNTER — Other Ambulatory Visit: Payer: Self-pay

## 2022-02-13 ENCOUNTER — Other Ambulatory Visit: Payer: Self-pay | Admitting: Gerontology

## 2022-02-13 DIAGNOSIS — J449 Chronic obstructive pulmonary disease, unspecified: Secondary | ICD-10-CM

## 2022-02-14 ENCOUNTER — Other Ambulatory Visit: Payer: Self-pay

## 2022-02-14 MED ORDER — ALBUTEROL SULFATE HFA 108 (90 BASE) MCG/ACT IN AERS
2.0000 | INHALATION_SPRAY | Freq: Four times a day (QID) | RESPIRATORY_TRACT | 4 refills | Status: DC | PRN
  Filled 2022-02-14: qty 6.7, 25d supply, fill #0
  Filled 2023-01-18: qty 8.5, 32d supply, fill #1

## 2022-02-14 MED ORDER — CETIRIZINE HCL 10 MG PO TABS
10.0000 mg | ORAL_TABLET | Freq: Every day | ORAL | 1 refills | Status: DC
  Filled 2022-02-14: qty 30, 30d supply, fill #0

## 2022-02-17 ENCOUNTER — Other Ambulatory Visit: Payer: Self-pay | Admitting: Gerontology

## 2022-02-17 ENCOUNTER — Other Ambulatory Visit: Payer: Self-pay

## 2022-02-18 ENCOUNTER — Other Ambulatory Visit: Payer: Self-pay

## 2022-02-19 ENCOUNTER — Other Ambulatory Visit: Payer: Self-pay

## 2022-02-19 MED ORDER — RIGHTEST GS550 BLOOD GLUCOSE VI STRP
ORAL_STRIP | 0 refills | Status: DC
  Filled 2022-02-19: qty 50, 25d supply, fill #0

## 2022-02-19 NOTE — Telephone Encounter (Signed)
Ok to give x 1. Patient needs to establish with new PCP since he has active insurance.

## 2022-02-20 ENCOUNTER — Other Ambulatory Visit: Payer: Self-pay

## 2022-02-26 ENCOUNTER — Other Ambulatory Visit: Payer: Self-pay

## 2022-03-01 ENCOUNTER — Other Ambulatory Visit: Payer: Self-pay

## 2022-03-05 ENCOUNTER — Other Ambulatory Visit: Payer: Self-pay

## 2022-03-22 ENCOUNTER — Other Ambulatory Visit: Payer: Self-pay

## 2022-04-08 ENCOUNTER — Other Ambulatory Visit: Payer: Self-pay | Admitting: Gerontology

## 2022-04-09 ENCOUNTER — Other Ambulatory Visit: Payer: Self-pay

## 2022-04-09 NOTE — Telephone Encounter (Signed)
No longer a patient at [facility]. Has active insurance. Needs to get from new PCP. He has appointment on 04/12/22 with new PCP

## 2022-04-12 ENCOUNTER — Other Ambulatory Visit: Payer: Self-pay

## 2022-04-12 ENCOUNTER — Ambulatory Visit (INDEPENDENT_AMBULATORY_CARE_PROVIDER_SITE_OTHER): Payer: PRIVATE HEALTH INSURANCE | Admitting: Physician Assistant

## 2022-04-12 ENCOUNTER — Encounter: Payer: Self-pay | Admitting: Physician Assistant

## 2022-04-12 VITALS — BP 117/67 | HR 107 | Temp 97.7°F | Resp 16 | Ht 69.0 in | Wt 276.7 lb

## 2022-04-12 DIAGNOSIS — E118 Type 2 diabetes mellitus with unspecified complications: Secondary | ICD-10-CM | POA: Diagnosis not present

## 2022-04-12 DIAGNOSIS — E782 Mixed hyperlipidemia: Secondary | ICD-10-CM

## 2022-04-12 DIAGNOSIS — Z7689 Persons encountering health services in other specified circumstances: Secondary | ICD-10-CM

## 2022-04-12 DIAGNOSIS — R053 Chronic cough: Secondary | ICD-10-CM

## 2022-04-12 DIAGNOSIS — J45901 Unspecified asthma with (acute) exacerbation: Secondary | ICD-10-CM

## 2022-04-12 DIAGNOSIS — J449 Chronic obstructive pulmonary disease, unspecified: Secondary | ICD-10-CM

## 2022-04-12 DIAGNOSIS — R0602 Shortness of breath: Secondary | ICD-10-CM

## 2022-04-12 DIAGNOSIS — I10 Essential (primary) hypertension: Secondary | ICD-10-CM

## 2022-04-12 DIAGNOSIS — J301 Allergic rhinitis due to pollen: Secondary | ICD-10-CM

## 2022-04-12 MED ORDER — CETIRIZINE HCL 10 MG PO TABS
10.0000 mg | ORAL_TABLET | Freq: Every day | ORAL | 1 refills | Status: DC
  Filled 2022-04-12 – 2022-04-16 (×2): qty 30, 30d supply, fill #0

## 2022-04-12 MED ORDER — IPRATROPIUM-ALBUTEROL 0.5-2.5 (3) MG/3ML IN SOLN
3.0000 mL | RESPIRATORY_TRACT | 1 refills | Status: DC | PRN
  Filled 2022-04-12: qty 180, 10d supply, fill #0

## 2022-04-12 MED ORDER — MONTELUKAST SODIUM 10 MG PO TABS
10.0000 mg | ORAL_TABLET | Freq: Every day | ORAL | 3 refills | Status: DC
  Filled 2022-04-12: qty 30, 30d supply, fill #0
  Filled 2022-05-24: qty 30, 30d supply, fill #1
  Filled 2022-07-24: qty 30, 30d supply, fill #2
  Filled 2022-09-15: qty 30, 30d supply, fill #3

## 2022-04-12 NOTE — Progress Notes (Signed)
I,Joseline E Rosas,acting as a Education administrator for [employer], PA-C.,have documented all relevant documentation on the behalf of Bradley Speak, PA-C,as directed by  [employer], PA-C while in the presence of [employer], PA-C.   New patient visit   Patient: Bradley Mckenzie.   DOB: 1965/06/04   57 y.o. Male  MRN: [id number] Visit Date: 04/12/2022  Today's healthcare provider: [name], PA-C   Chief Complaint  Patient presents with  . Establish Care   Subjective    Bradley Mckenzie. is a 57 y.o. male who presents today as a new patient to establish care.  HPI  Patient reports not taking Gabapentin because he never receive a refill and is the same thing for Vitamin D.  Past Medical History:  Diagnosis Date  . Asthma   . Cardiomyopathy ([place]) 2016   a. 01/2014 Echo: EF 25-30%;  b. ? ischemic vs non-ischemic.  He's never had an ischemic eval.  . Chronic combined systolic (congestive) and diastolic (congestive) heart failure ([place])    a. 01/2014 Echo: EF 25-30%. b. Echo 03/2015: Improved EF of 40-45%  . COPD (chronic obstructive pulmonary disease) ([place])   . Depression   . Diabetes mellitus without complication ([place]) 99991111   started med therapy approx 2018  . Hyperlipidemia   . Hypertensive cardiomyopathy ([place]) 06/30/2015  . Hypertensive heart disease    a. Since his 89's.  . Morbid obesity ([place])   . Obstructive sleep apnea    a. Has not used CPAP since ~ 2012.   Past Surgical History:  Procedure Laterality Date  . HERNIA REPAIR  2004   Family Status  Relation Name Status  . Mother  Alive  . Father  Deceased at age 102  . Sister  Deceased at age 9  . Annamarie Major  Deceased   Family History  Problem Relation Age of Onset  . Heart disease Mother        alive & well.  [name] Heart attack Father 48       died @ 5 of cancer.  . Diabetes Father   . Cancer Father        "in his stomach"  . Cancer Sister   . Cancer Paternal Uncle        strong FH malignancy multiple relavies     Social History   Socioeconomic History  . Marital status: Married    Spouse name: Not on file  . Number of children: 2  . Years of education: 94  . Highest education level: GED or equivalent  Occupational History  . Occupation: unemployed    Comment: hard time getting disability, denied medicaid   Tobacco Use  . Smoking status: Never  . Smokeless tobacco: Never  . Tobacco comments:    Quit in his 57's.  Vaping Use  . Vaping Use: Never used  Substance and Sexual Activity  . Alcohol use: Not Currently    Comment: rarely alcohol use at special events  . Drug use: Not Currently    Comment: Used marijuana in his teens.  . Sexual activity: Not Currently  Other Topics Concern  . Not on file  Social History Narrative   Lives in [place] with his wife. Wife no longer working; now on disability. Have stable housing at group home that his wife used to work at. Sometimes struggles with transportation but not desperate. Wife has working car, his doesn't work. Had food stamps but cut off after 3 months. Wasn't eligible after that  bc not able to work. Has been denied twice from disability. He was not able to obtain medicaid   Social Determinants of Health   Financial Resource Strain: Low Risk  (11/28/2021)   Overall Financial Resource Strain (CARDIA)   . Difficulty of Paying Living Expenses: Not hard at all  Food Insecurity: No Food Insecurity (05/17/2021)   Hunger Vital Sign   . Worried About Charity fundraiser in the Last Year: Never true   . Ran Out of Food in the Last Year: Never true  Transportation Needs: No Transportation Needs (05/17/2021)   PRAPARE - Transportation   . Lack of Transportation (Medical): No   . Lack of Transportation (Non-Medical): No  Physical Activity: Inactive (11/28/2021)   Exercise Vital Sign   . Days of Exercise per Week: 0 days   . Minutes of Exercise per Session: 0 min  Stress: No Stress Concern Present (11/28/2021)   [place]   . Feeling of Stress : Only a little  Social Connections: Moderately Integrated (11/28/2021)   Social Connection and Isolation Panel [NHANES]   . Frequency of Communication with Friends and Family: More than three times a week   . Frequency of Social Gatherings with Friends and Family: Once a week   . Attends Religious Services: 1 to 4 times per year   . Active Member of Clubs or Organizations: No   . Attends Archivist Meetings: Never   . Marital Status: Married   Outpatient Medications Prior to Visit  Medication Sig  . albuterol (PROAIR HFA) 108 (90 Base) MCG/ACT inhaler Inhale 2 puffs into the lungs every 6 (six) hours as needed for wheezing or shortness of breath.  [name] atorvastatin (LIPITOR) 10 MG tablet TAKE ONE TABLET BY MOUTH EVERY DAY  . carvedilol (COREG) 12.5 MG tablet Take 1 tablet (12.5 mg total) by mouth 2 (two) times daily with a meal.  . cetirizine (ZYRTEC) 10 MG tablet Take 1 tablet (10 mg total) by mouth daily.  . fluticasone-salmeterol (WIXELA INHUB) 500-50 MCG/ACT AEPB Inhale 1 puff into the lungs twice a day in the morning and at bedtime.  [name] glucose blood (RIGHTEST GS550 BLOOD GLUCOSE) test strip USE AS DIRECTED.  . hydrALAZINE (APRESOLINE) 25 MG tablet TAKE ONE TABLET BY MOUTH 2 TIMES A DAY  . insulin glargine (LANTUS) 100 UNIT/ML Solostar Pen Inject 31 Units into the skin once daily.  . Insulin Pen Needle 32G X 4 MM MISC Use as directed  . ipratropium-albuterol (DUONEB) 0.5-2.5 (3) MG/3ML SOLN INHALE CONTENTS OF ONE VIAL (3ML) USING NEBULIZER ONCE EVERY 4 HOURS AS NEEDED  . losartan (COZAAR) 100 MG tablet TAKE ONE TABLET BY MOUTH EVERY DAY  . metFORMIN (GLUCOPHAGE) 500 MG tablet TAKE ONE TABLET BY MOUTH 2 TIMES A DAY WITH A MEAL.  . montelukast (SINGULAIR) 10 MG tablet TAKE ONE TABLET BY MOUTH AT BEDTIME  . potassium chloride SA (KLOR-CON M) 20 MEQ tablet TAKE ONE TABLET BY MOUTH 2 TIMES A DAY  .  Rightest GL300 Lancets MISC USE AS DIRECTED.  . Semaglutide, 2 MG/DOSE, (OZEMPIC, 2 MG/DOSE,) 8 MG/3ML SOPN Inject 2mg  into the skin once a week  . torsemide (DEMADEX) 20 MG tablet Take 2 tablets (40 mg total) by mouth once daily. May take additional 1 tablet (20mg  total) in the evening if needed.  . Blood Pressure Monitoring (BLOOD PRESSURE CUFF) MISC USE AS DIRECTED ONCE DAILY AT 12 NOON. (Patient not taking: Reported on  01/29/2022)  . gabapentin (NEURONTIN) 300 MG capsule Take 1 capsule (300 mg total) by mouth daily. (Patient not taking: Reported on 04/12/2022)  . Vitamin D, Ergocalciferol, (DRISDOL) 1.25 MG (50000 UNIT) CAPS capsule Take 1 capsule (50,000 Units total) by mouth every 7 (seven) days. (Patient not taking: Reported on 04/12/2022)   No facility-administered medications prior to visit.   No Known Allergies   There is no immunization history on file for this patient.  Health Maintenance  Topic Date Due  . COVID-19 Vaccine (1) Never done  . OPHTHALMOLOGY EXAM  Never done  . HIV Screening  Never done  . Hepatitis C Screening  Never done  . DTaP/Tdap/Td (1 - Tdap) Never done  . Zoster Vaccines- Shingrix (1 of 2) Never done  . COLONOSCOPY (Pts 45-70yrs Insurance coverage will need to be confirmed)  Never done  . Diabetic kidney evaluation - Urine ACR  09/29/2020  . FOOT EXAM  11/29/2020  . INFLUENZA VACCINE  Never done  . HEMOGLOBIN A1C  05/15/2022  . Diabetic kidney evaluation - eGFR measurement  11/15/2022  . HPV VACCINES  Aged Out    Patient Care Team: Langston Reusing, NP as PCP - General (Gerontology) Alisa Graff, FNP as Nurse Practitioner (Family Medicine) Corey Skains, MD as Consulting Physician (Cardiology)  Review of Systems  HENT:  Positive for congestion and sinus pressure.   Eyes:  Positive for photophobia.  Respiratory:  Positive for apnea, cough, shortness of breath ("COPD") and wheezing.   Gastrointestinal:  Positive for diarrhea.   Endocrine: Positive for heat intolerance and polyuria.  Allergic/Immunologic: Positive for environmental allergies.  Neurological:  Positive for dizziness, light-headedness and headaches.  Psychiatric/Behavioral:  Positive for agitation.     {Labs  Heme  Chem  Endocrine  Serology  Results Review (optional):23779}   Objective    BP 117/67 (BP Location: Left Arm, Patient Position: Sitting, Cuff Size: Large)   Pulse (!) 107   Temp 97.7 F (36.5 C) (Oral)   Resp 16   Ht 5\' 9"  (1.753 m)   Wt 276 lb 11.2 oz (125.5 kg)   SpO2 95%   BMI 40.86 kg/m  {Show previous vital signs (optional):23777}  Physical Exam Vitals reviewed.  Constitutional:      General: He is in acute distress.     Appearance: Normal appearance. He is not ill-appearing, toxic-appearing or diaphoretic.  HENT:     Head: Normocephalic and atraumatic.     Right Ear: Tympanic membrane, ear canal and external ear normal.     Left Ear: Tympanic membrane, ear canal and external ear normal.     Nose: Congestion and rhinorrhea present.     Mouth/Throat:     Pharynx: Posterior oropharyngeal erythema present.  Eyes:     General: No scleral icterus.       Right eye: No discharge.        Left eye: No discharge.     Extraocular Movements: Extraocular movements intact.     Conjunctiva/sclera: Conjunctivae normal.     Pupils: Pupils are equal, round, and reactive to light.  Cardiovascular:     Rate and Rhythm: Normal rate and regular rhythm.     Pulses: Normal pulses.     Heart sounds: Normal heart sounds. No murmur heard. Pulmonary:     Effort: Pulmonary effort is normal. No respiratory distress.     Breath sounds: Normal breath sounds. No wheezing or rhonchi.  Abdominal:     General: Abdomen is flat.  Bowel sounds are normal.     Palpations: Abdomen is soft.  Musculoskeletal:        General: Normal range of motion.     Cervical back: Normal range of motion and neck supple.     Right lower leg: No edema.      Left lower leg: No edema.  Lymphadenopathy:     Cervical: No cervical adenopathy.  Skin:    General: Skin is warm and dry.     Findings: No rash.  Neurological:     General: No focal deficit present.     Mental Status: He is alert and oriented to person, place, and time. Mental status is at baseline.  Psychiatric:        Behavior: Behavior normal.        Thought Content: Thought content normal.        Judgment: Judgment normal.   ***  Depression Screen    04/12/2022    3:50 PM 08/14/2021    8:37 AM 11/09/2020    1:04 PM 07/26/2020   11:51 AM  PHQ 2/9 Scores  PHQ - 2 Score 1 0 0 0  PHQ- 9 Score 7      No results found for any visits on 04/12/22.  Assessment & Plan     1. Essential hypertension Chronic and stable BP WNL  - Comprehensive metabolic panel - Hemoglobin A1c - Urine microalbumin-creatinine with uACR Will reassess after  receiving lab results  2. Shortness of breath Chronic and stable  - Comprehensive metabolic panel - Ambulatory referral to Pulmonology  3. Type 2 diabetes mellitus with complication (HCC) Chronic - Hemoglobin A1c - Urine microalbumin-creatinine with uACR  4. Mixed hyperlipidemia *** - Lipid panel  Chronic obstructive pulmonary disease, unspecified COPD type (HCC) Chronic cough *** - Ambulatory referral to Pulmonology - ipratropium-albuterol (DUONEB) 0.5-2.5 (3) MG/3ML SOLN; Take 3 mLs by nebulization every 4 (four) hours as needed.  Dispense: 180 mL; Refill: 1  6. Moderate asthma with exacerbation, unspecified whether persistent *** - ipratropium-albuterol (DUONEB) 0.5-2.5 (3) MG/3ML SOLN; Take 3 mLs by nebulization every 4 (four) hours as needed.  Dispense: 180 mL; Refill: 1  7. Seasonal allergic rhinitis due to pollen Seasonal problem Refill was sent for: - montelukast (SINGULAIR) 10 MG tablet; Take 1 tablet (10 mg total) by mouth at bedtime.  Dispense: 30 tablet; Refill: 3 - cetirizine (ZYRTEC) 10 MG tablet; Take 1 tablet (10  mg total) by mouth daily.  Dispense: 90 tablet; Refill: 1 Will FU     No follow-ups on file.     The patient was advised to call back or seek an in-person evaluation if the symptoms worsen or if the condition fails to improve as anticipated.  I discussed the assessment and treatment plan with the patient. The patient was provided an opportunity to ask questions and all were answered. The patient agreed with the plan and demonstrated an understanding of the instructions.  I, Bradley Speak, PA-C have reviewed all documentation for this visit. The documentation on  04/12/22  for the exam, diagnosis, procedures, and orders are all accurate and complete.  Bradley Mckenzie, [facility], [place] [phone] (phone) [phone] (fax)   [employer]

## 2022-04-16 ENCOUNTER — Other Ambulatory Visit: Payer: Self-pay

## 2022-04-18 ENCOUNTER — Telehealth: Payer: Self-pay | Admitting: Physician Assistant

## 2022-04-18 ENCOUNTER — Other Ambulatory Visit: Payer: Self-pay

## 2022-04-18 NOTE — Telephone Encounter (Signed)
Patient needs order for a new glucometer, test strips and lancets...  Accucheck guide is the one his insurance will pay for.  Send to [place] please ASAP.  He would like to get it today.

## 2022-04-19 ENCOUNTER — Other Ambulatory Visit: Payer: Self-pay

## 2022-04-19 ENCOUNTER — Other Ambulatory Visit: Payer: Self-pay | Admitting: Physician Assistant

## 2022-04-19 LAB — HEMOGLOBIN A1C
Est. average glucose Bld gHb Est-mCnc: 166 mg/dL
Hgb A1c MFr Bld: 7.4 % — ABNORMAL HIGH (ref 4.8–5.6)

## 2022-04-19 LAB — COMPREHENSIVE METABOLIC PANEL
ALT: 27 IU/L (ref 0–44)
AST: 15 IU/L (ref 0–40)
Albumin/Globulin Ratio: 1.4 (ref 1.2–2.2)
Albumin: 4.5 g/dL (ref 3.8–4.9)
Alkaline Phosphatase: 118 IU/L (ref 44–121)
BUN/Creatinine Ratio: 10 (ref 9–20)
BUN: 16 mg/dL (ref 6–24)
Bilirubin Total: 0.3 mg/dL (ref 0.0–1.2)
CO2: 23 mmol/L (ref 20–29)
Calcium: 9.6 mg/dL (ref 8.7–10.2)
Chloride: 101 mmol/L (ref 96–106)
Creatinine, Ser: 1.68 mg/dL — ABNORMAL HIGH (ref 0.76–1.27)
Globulin, Total: 3.3 g/dL (ref 1.5–4.5)
Glucose: 123 mg/dL — ABNORMAL HIGH (ref 70–99)
Potassium: 4 mmol/L (ref 3.5–5.2)
Sodium: 140 mmol/L (ref 134–144)
Total Protein: 7.8 g/dL (ref 6.0–8.5)
eGFR: 48 mL/min/{1.73_m2} — ABNORMAL LOW (ref >59)

## 2022-04-19 LAB — LIPID PANEL
Chol/HDL Ratio: 3.3 ratio (ref 0.0–5.0)
Cholesterol, Total: 154 mg/dL (ref 100–199)
HDL: 46 mg/dL (ref >39)
LDL Chol Calc (NIH): 87 mg/dL (ref 0–99)
Triglycerides: 118 mg/dL (ref 0–149)
VLDL Cholesterol Cal: 21 mg/dL (ref 5–40)

## 2022-04-19 LAB — MICROALBUMIN / CREATININE URINE RATIO
Creatinine, Urine: 297.2 mg/dL
Microalb/Creat Ratio: 11 mg/g creat (ref 0–29)
Microalbumin, Urine: 33.8 ug/mL

## 2022-04-19 MED ORDER — ACCU-CHEK GUIDE W/DEVICE KIT
1.0000 | PACK | Freq: Three times a day (TID) | 12 refills | Status: AC
  Filled 2022-04-19: qty 1, 30d supply, fill #0

## 2022-04-19 MED ORDER — ACCU-CHEK SOFTCLIX LANCETS MISC
0 refills | Status: DC
  Filled 2022-04-19: qty 100, 30d supply, fill #0
  Filled 2022-04-19: qty 1, fill #0

## 2022-04-19 MED ORDER — ACCU-CHEK GUIDE VI STRP
ORAL_STRIP | 12 refills | Status: AC
  Filled 2022-04-19: qty 100, 30d supply, fill #0
  Filled 2022-09-15: qty 100, 30d supply, fill #1

## 2022-04-19 NOTE — Addendum Note (Signed)
Addended by: Lily Kocher on: 04/19/2022 11:55 AM   Modules accepted: Orders

## 2022-04-21 NOTE — Progress Notes (Signed)
Please , let pt know that his labs stable for him Except decreased kidney function. He needs to drink 8 glasses of water every day, advised to watch for leg swelling, avoid NSAIDs, and have renal functions check avery 6-12 months to ensure stability. His A1c was 7.4, improved since 5 mo ago. Advised an adherence to low carb diet, regular exercise and current regimen. Will discuss in details during his next appt.

## 2022-04-29 ENCOUNTER — Telehealth: Payer: Self-pay

## 2022-04-29 NOTE — Telephone Encounter (Signed)
Copied from CRM [id number]. Topic: General - Call Back - No Documentation >> Apr 24, 2022 10:40 AM [name] wrote: Reason for CRM: Pt stated he had a missed call from the office so he was returning the call. Pt requests call back

## 2022-04-30 ENCOUNTER — Ambulatory Visit: Payer: PRIVATE HEALTH INSURANCE | Attending: Family | Admitting: Family

## 2022-04-30 ENCOUNTER — Encounter: Payer: Self-pay | Admitting: Family

## 2022-04-30 VITALS — BP 146/85 | HR 91 | Resp 16 | Wt 276.2 lb

## 2022-04-30 DIAGNOSIS — E119 Type 2 diabetes mellitus without complications: Secondary | ICD-10-CM

## 2022-04-30 DIAGNOSIS — Z7984 Long term (current) use of oral hypoglycemic drugs: Secondary | ICD-10-CM | POA: Diagnosis not present

## 2022-04-30 DIAGNOSIS — R0602 Shortness of breath: Secondary | ICD-10-CM | POA: Insufficient documentation

## 2022-04-30 DIAGNOSIS — I10 Essential (primary) hypertension: Secondary | ICD-10-CM | POA: Diagnosis not present

## 2022-04-30 DIAGNOSIS — R42 Dizziness and giddiness: Secondary | ICD-10-CM | POA: Diagnosis not present

## 2022-04-30 DIAGNOSIS — R002 Palpitations: Secondary | ICD-10-CM | POA: Insufficient documentation

## 2022-04-30 DIAGNOSIS — R531 Weakness: Secondary | ICD-10-CM | POA: Insufficient documentation

## 2022-04-30 DIAGNOSIS — Z79899 Other long term (current) drug therapy: Secondary | ICD-10-CM | POA: Insufficient documentation

## 2022-04-30 DIAGNOSIS — E785 Hyperlipidemia, unspecified: Secondary | ICD-10-CM | POA: Diagnosis not present

## 2022-04-30 DIAGNOSIS — J4489 Other specified chronic obstructive pulmonary disease: Secondary | ICD-10-CM | POA: Diagnosis not present

## 2022-04-30 DIAGNOSIS — I110 Hypertensive heart disease with heart failure: Secondary | ICD-10-CM | POA: Diagnosis not present

## 2022-04-30 DIAGNOSIS — G4733 Obstructive sleep apnea (adult) (pediatric): Secondary | ICD-10-CM | POA: Diagnosis not present

## 2022-04-30 DIAGNOSIS — R0789 Other chest pain: Secondary | ICD-10-CM | POA: Insufficient documentation

## 2022-04-30 DIAGNOSIS — Z794 Long term (current) use of insulin: Secondary | ICD-10-CM

## 2022-04-30 DIAGNOSIS — I5032 Chronic diastolic (congestive) heart failure: Secondary | ICD-10-CM

## 2022-04-30 DIAGNOSIS — J45909 Unspecified asthma, uncomplicated: Secondary | ICD-10-CM | POA: Diagnosis not present

## 2022-04-30 DIAGNOSIS — I5042 Chronic combined systolic (congestive) and diastolic (congestive) heart failure: Secondary | ICD-10-CM | POA: Diagnosis not present

## 2022-04-30 DIAGNOSIS — Z7722 Contact with and (suspected) exposure to environmental tobacco smoke (acute) (chronic): Secondary | ICD-10-CM | POA: Insufficient documentation

## 2022-04-30 DIAGNOSIS — M79673 Pain in unspecified foot: Secondary | ICD-10-CM | POA: Diagnosis not present

## 2022-04-30 NOTE — Progress Notes (Signed)
Patient ID: Bradley Sahr., male    DOB: Nov 05, 1965, 57 y.o.   MRN: [id number]  Primary cardiologist: None PCP: [name], PA (last seen 03/24; returns 05/24)  HPI  Bradley Mckenzie is a 57 y/o male who has a history of diabetes, HTN, obstructive sleep apnea, depression, hyperlipidemia, morbid obesity and heart failure with a reduced ejection fraction. Remote tobacco exposure.   Echo 10/28/2018: EF of 60-65% along with mildly elevated PA pressure. Echo 09/02/15: EF of 30-35%, mild mitral regurgitation and no aortic stenosis. EF has declined from 40-45% in March 2017 but he had been inconsistent taking his medications & not using his CPAP during this time.  Was in the ED 12/18/21 due to LBP radiating down left leg. Was in the ED 12/09/21 due to left foot numbness.   Returns today for a HF follow-up visit with a chief complaint of SOB with moderate exertion. Chronic in nature. Has fatigue, occasional chest tightness, cough, wheezing, palpitations, pedal edema, light-headedness and weakness (when coughing) along with this. Denies difficulty sleeping, abdominal distention, chest pain or weight gain.   Is supposed to be having a back MRI done due to his leg/ foot pain but has to wait to see if his insurance will cover it. Says that his CPAP mask doesn't fit correctly anymore & he's wondering about getting a sleep study done.  Now living with his son in [place] in an apt that is on the 3rd floor without an elevator. He says that by the time he gets to the 3rd floor, he's coughing and SOB but does recover quickly.    He hasn't taken his meds yet today because he hasn't eaten breakfast. Will take them upon his return home.   Past Surgical History:  Procedure Laterality Date   HERNIA REPAIR  2004   Family History  Problem Relation Age of Onset   Heart disease Mother        alive & well.   Heart attack Father 15       died @ 24 of cancer.   Diabetes Father    Cancer Father        "in his  stomach"   Cancer Sister    Cancer Paternal Uncle        strong FH malignancy multiple relavies    Social History   Tobacco Use   Smoking status: Never   Smokeless tobacco: Never   Tobacco comments:    Quit in his 27's.  Substance Use Topics   Alcohol use: Not Currently    Comment: rarely alcohol use at special events   Past Medical History:  Diagnosis Date   Allergy    Asthma    Cardiomyopathy (HCC) 2016   a. 01/2014 Echo: EF 25-30%;  b. ? ischemic vs non-ischemic.  He's never had an ischemic eval.   Chronic combined systolic (congestive) and diastolic (congestive) heart failure (HCC)    a. 01/2014 Echo: EF 25-30%. b. Echo 03/2015: Improved EF of 40-45%   COPD (chronic obstructive pulmonary disease) (HCC)    Depression    Diabetes mellitus without complication (HCC) 2018   started med therapy approx 2018   Hyperlipidemia    Hypertension    Hypertensive cardiomyopathy (HCC) 06/30/2015   Hypertensive heart disease    a. Since his 9's.   Morbid obesity (HCC)    Obstructive sleep apnea    a. Has not used CPAP since ~ 2012.   Sleep apnea    Past Surgical History:  Procedure  Laterality Date   HERNIA REPAIR  2004   Family History  Problem Relation Age of Onset   Heart disease Mother        alive & well.   Heart attack Father 67       died @ 61 of cancer.   Diabetes Father    Cancer Father        "in his stomach"   Cancer Sister    Cancer Paternal Uncle        strong FH malignancy multiple relavies    Social History   Tobacco Use   Smoking status: Never   Smokeless tobacco: Never   Tobacco comments:    Quit in his 2's.  Substance Use Topics   Alcohol use: Not Currently    Comment: rarely alcohol use at special events   No Known Allergies  Prior to Admission medications   Medication Sig Start Date End Date Taking? Authorizing Provider  Accu-Chek Softclix Lancets lancets Use to check blood sugar three times daily 04/19/22  Yes Ostwalt, Janna, PA-C   albuterol (PROAIR HFA) 108 (90 Base) MCG/ACT inhaler Inhale 2 puffs into the lungs every 6 (six) hours as needed for wheezing or shortness of breath. 02/14/22  Yes Iloabachie, Chioma E, NP  atorvastatin (LIPITOR) 10 MG tablet TAKE ONE TABLET BY MOUTH EVERY DAY 11/28/21 11/28/22 Yes Iloabachie, Chioma E, NP  Blood Glucose Monitoring Suppl (ACCU-CHEK GUIDE) w/Device KIT 1 each by Does not apply route 3 (three) times daily. 04/19/22  Yes Ostwalt, Edmon Crape, PA-C  Blood Pressure Monitoring (BLOOD PRESSURE CUFF) MISC USE AS DIRECTED ONCE DAILY AT 12 NOON. 11/09/20  Yes Regalado, Belkys A, MD  carvedilol (COREG) 12.5 MG tablet Take 1 tablet (12.5 mg total) by mouth 2 (two) times daily with a meal. 11/28/21  Yes Iloabachie, Chioma E, NP  cetirizine (ZYRTEC) 10 MG tablet Take 1 tablet (10 mg total) by mouth daily. 04/12/22 04/12/23 Yes Ostwalt, Edmon Crape, PA-C  dapagliflozin propanediol (FARXIGA) 10 MG TABS tablet Take 10 mg by mouth daily.   Yes [provider]  fluticasone-salmeterol (WIXELA INHUB) 500-50 MCG/ACT AEPB Inhale 1 puff into the lungs twice a day in the morning and at bedtime. 12/04/21  Yes Iloabachie, Chioma E, NP  glucose blood (ACCU-CHEK GUIDE) test strip Use as instructed 04/19/22  Yes Ostwalt, Janna, PA-C  hydrALAZINE (APRESOLINE) 25 MG tablet TAKE ONE TABLET BY MOUTH 2 TIMES A DAY 08/07/21  Yes Iloabachie, Chioma E, NP  insulin glargine (LANTUS) 100 UNIT/ML Solostar Pen Inject 31 Units into the skin once daily. 02/21/21  Yes Iloabachie, Chioma E, NP  Insulin Pen Needle 32G X 4 MM MISC Use as directed 07/18/21  Yes Iloabachie, Chioma E, NP  ipratropium-albuterol (DUONEB) 0.5-2.5 (3) MG/3ML SOLN Take 3 mLs by nebulization every 4 (four) hours as needed. 04/12/22 04/12/23 Yes Ostwalt, Edmon Crape, PA-C  losartan (COZAAR) 100 MG tablet TAKE ONE TABLET BY MOUTH EVERY DAY 11/28/21 11/28/22 Yes Iloabachie, Chioma E, NP  metFORMIN (GLUCOPHAGE) 500 MG tablet TAKE ONE TABLET BY MOUTH 2 TIMES A DAY WITH A MEAL. 08/07/21  08/07/22 Yes Iloabachie, Chioma E, NP  montelukast (SINGULAIR) 10 MG tablet Take 1 tablet (10 mg total) by mouth at bedtime. 04/12/22 04/12/23 Yes Ostwalt, Edmon Crape, PA-C  potassium chloride SA (KLOR-CON M) 20 MEQ tablet TAKE ONE TABLET BY MOUTH 2 TIMES A DAY 11/28/21 11/28/22 Yes Iloabachie, Chioma E, NP  Rightest GL300 Lancets MISC USE AS DIRECTED. 11/20/21  Yes Iloabachie, Chioma E, NP  Semaglutide, 2 MG/DOSE, (OZEMPIC,  2 MG/DOSE,) 8 MG/3ML SOPN Inject  into the skin once a week 08/14/21  Yes Iloabachie, Chioma E, NP  torsemide (DEMADEX) 20 MG tablet Take 2 tablets (40 mg total) by mouth once daily. May take additional 1 tablet (  total) in the evening if needed. 11/19/21  Yes Dayra Rapley, Inetta Fermo A, FNP  gabapentin (NEURONTIN) 300 MG capsule Take 1 capsule (300 mg total) by mouth daily. Patient not taking: Reported on 04/12/2022 01/18/22     Vitamin D, Ergocalciferol, (DRISDOL) 1.25 MG (50000 UNIT) CAPS capsule Take 1 capsule (50,000 Units total) by mouth every 7 (seven) days. Patient not taking: Reported on 04/12/2022 11/28/21   Eulogio Bear E, NP   Review of Systems  Constitutional:  Positive for fatigue. Negative for appetite change.  HENT:  Positive for postnasal drip. Negative for congestion and sore throat.   Eyes: Negative.   Respiratory:  Positive for cough, chest tightness, shortness of breath (with little exertion) and wheezing.   Cardiovascular:  Positive for palpitations (sometimes) and leg swelling. Negative for chest pain.  Gastrointestinal:  Negative for abdominal distention and abdominal pain.  Endocrine: Negative.   Genitourinary: Negative.   Musculoskeletal:  Positive for arthralgias (left heel pain). Negative for back pain and neck pain.  Skin: Negative.   Allergic/Immunologic: Negative.   Neurological:  Positive for weakness ("when coughing") and light-headedness (when coughing). Negative for dizziness and headaches.  Hematological:  Negative for adenopathy. Does not  bruise/bleed easily.  Psychiatric/Behavioral:  Negative for dysphoric mood and sleep disturbance. The patient is not nervous/anxious.    Vitals:   04/30/22 0927  BP: (!) 146/85  Pulse: 91  Resp: 16  SpO2: 97%  Weight: 276 lb 4 oz (125.3 kg)   Wt Readings from Last 3 Encounters:  04/30/22 276 lb 4 oz (125.3 kg)  04/12/22 276 lb 11.2 oz (125.5 kg)  01/29/22 276 lb 6 oz (125.4 kg)   Lab Results  Component Value Date   CREATININE 1.68 (H) 04/18/2022   CREATININE 1.65 (H) 11/14/2021   CREATININE 1.47 (H) 08/08/2021   Physical Exam Vitals and nursing note reviewed.  Constitutional:      Appearance: He is well-developed.  HENT:     Head: Normocephalic and atraumatic.  Neck:     Vascular: No JVD.  Cardiovascular:     Rate and Rhythm: Normal rate and regular rhythm.  Pulmonary:     Effort: Pulmonary effort is normal. No tachypnea.     Breath sounds: Wheezing (expiratory throughout all lung fields) present. No rales.  Abdominal:     General: There is no distension.     Palpations: Abdomen is soft.     Tenderness: There is no abdominal tenderness.  Musculoskeletal:        General: No tenderness.     Cervical back: Neck supple.     Left knee: Normal.     Right lower leg: Edema (trace pitting) present.     Left lower leg: No edema.  Skin:    General: Skin is warm and dry.  Neurological:     Mental Status: He is alert and oriented to person, place, and time.  Psychiatric:        Behavior: Behavior normal.    Assessment & Plan:  1: Chronic heart failure with preserved ejection fraction-  - NYHA Class II - euvolemic today - weighing daily; reminded to call for an overnight weight gain of >2 pounds or a weekly weight gain of >5 pounds - weight unchanged from last  visit here 3 months ago - trying to eat low sodium foods when he can but says it's difficult at times due to finances - CMA will fax PA for echo and if approved, will call patient to get this scheduled - continue  carvedilol 12.5mg  BID - continue losartan  daily; consider changing this to entresto now that he has insurance coverage - continue farxiga  daily - continue torsemide  daily - continue potassium BID - encouraged him to walk up / down the 3 flights of stairs as often as he can and that it should get easier over time - BNP 09/01/15 was 1546.0   2: HTN- - BP 146/85 (no meds taken yet today) - saw PCP (Ostwalt) 03/24; returns 05/24 - continue hydralazine  BID - BMP on 04/18/22 reviewed and shows sodium 140, potassium 4.0, creatinine 1.68 and GFR 48  3: Diabetes- - A1c on 04/18/22 was 7.4% - continue metformin  BID - continue ozempic  4: Asthma- - on advair and albuterol rescue inhaler - has persistent coughing spells   5: Sleep apnea-  - wearing CPAP nightly although he says that he doesn't feel like the mask fits correctly anymore - last sleep study was done 10/16 - has upcoming pulmonology appt Sherene Sires) next week - CMA will fax PA to see if a home sleep study will be approved and will get back to patient   Asked him to take today's med list and compare it to his med bottles at home and let us know if there are any discrepancies.   Return in 3 months, sooner if needed.

## 2022-05-08 NOTE — Progress Notes (Signed)
Isiac Taygen Acklin., male    DOB: 1965-08-12    MRN: [id number]   Brief patient profile:  50  yobf never really smoked regularly served in [employer] referred to pulmonary clinic in [address]  05/10/2022 by [name] PA  for sob/ cough    Baseline wt 248 around 2012  History of Present Illness  05/10/2022  Pulmonary/ 1st office eval/ Anthon[facility] Klaus[facility] / [name] - wt 273/  on maint wixella and prn saba but very poor Radiation protection practitioner Complaint  Patient presents with   Consult    Pt consult he says that he has been experiencing SOB >24yrs was unable to narrow down a timeframe of it worsening. States he smoke around the age of [age]   Dyspnea:  used to walk any where he wanted but around 2017 noted worsened now has go to slower than  others.  Has to stop on 2nd landing  to catch breath since moving into new appt. Cough: tends to be worse with ex and variably at hs/ non productive Sleep: flat bed x 1 pillows   7nights/ a week / places cpap always sob and sometimes coughing / has not tried recliner  SABA use: w/in on hour of ov  No obvious patterns in day to day or daytime variability or assoc excess/ purulent sputum or mucus plugs or hemoptysis or cp or chest tightness, subjective wheeze or overt  hb symptoms.     Also denies any obvious fluctuation of symptoms with weather or environmental changes or other aggravating or alleviating factors except as outlined above   No unusual exposure hx or h/o childhood pna/ asthma or knowledge of premature birth.  Current Allergies, Complete Past Medical History, Past Surgical History, Family History, and Social History were reviewed in [name] record.  ROS  The following are not active complaints unless bolded Hoarseness, sore throat, dysphagia, dental problems, itching, sneezing,  nasal congestion or discharge of excess mucus or purulent secretions, ear ache,   fever, chills, sweats, unintended wt loss or wt gain, classically pleuritic or  exertional cp,  orthopnea pnd or arm/hand swelling  or leg swelling, presyncope, palpitations, abdominal pain, anorexia, nausea, vomiting, diarrhea  or change in bowel habits or change in bladder habits, change in stools or change in urine, dysuria, hematuria,  rash, arthralgias/back pain, visual complaints, headache, numbness, weakness or ataxia or problems with walking or coordination,  change in mood or  memory.             Past Medical History:  Diagnosis Date   Allergy    Asthma    Cardiomyopathy (HCC) 2016   a. 01/2014 Echo: EF 25-30%;  b. ? ischemic vs non-ischemic.  He's never had an ischemic eval.   Chronic combined systolic (congestive) and diastolic (congestive) heart failure (HCC)    a. 01/2014 Echo: EF 25-30%. b. Echo 03/2015: Improved EF of 40-45%   COPD (chronic obstructive pulmonary disease) (HCC)    Depression    Diabetes mellitus without complication (HCC) 2018   started med therapy approx 2018   Hyperlipidemia    Hypertension    Hypertensive cardiomyopathy (HCC) 06/30/2015   Hypertensive heart disease    a. Since his 85's.   Morbid obesity (HCC)    Obstructive sleep apnea    a. Has not used CPAP since ~ 2012.   Sleep apnea     Outpatient Medications Prior to Visit - - NOTE:   Unable to verify as accurately reflecting what pt  takes    Medication Sig Dispense Refill   Accu-Chek Softclix Lancets lancets Use to check blood sugar three times daily 100 each 0   albuterol (PROAIR HFA) 108 (90 Base) MCG/ACT inhaler Inhale 2 puffs into the lungs every 6 (six) hours as needed for wheezing or shortness of breath. 8.5 g 4   atorvastatin (LIPITOR) 10 MG tablet TAKE ONE TABLET BY MOUTH EVERY DAY 90 tablet 4   Blood Glucose Monitoring Suppl (ACCU-CHEK GUIDE) w/Device KIT 1 each by Does not apply route 3 (three) times daily. 1 kit 12   Blood Pressure Monitoring (BLOOD PRESSURE CUFF) MISC USE AS DIRECTED ONCE DAILY AT 12 NOON. 1 each 0   carvedilol (COREG) 12.5 MG tablet Take 1  tablet (12.5 mg total) by mouth 2 (two) times daily with a meal. 180 tablet 3   cetirizine (ZYRTEC) 10 MG tablet Take 1 tablet (10 mg total) by mouth daily. 90 tablet 1   dapagliflozin propanediol (FARXIGA) 10 MG TABS tablet Take 10 mg by mouth daily.     gabapentin (NEURONTIN) 300 MG capsule Take 1 capsule (300 mg total) by mouth daily. 30 capsule 0   glucose blood (ACCU-CHEK GUIDE) test strip Use as instructed 100 each 12   hydrALAZINE (APRESOLINE) 25 MG tablet TAKE ONE TABLET BY MOUTH 2 TIMES A DAY 180 tablet 1   insulin glargine (LANTUS) 100 UNIT/ML Solostar Pen Inject 31 Units into the skin once daily. 15 mL 4   Insulin Pen Needle 32G X 4 MM MISC Use as directed 100 each 11   ipratropium-albuterol (DUONEB) 0.5-2.5 (3) MG/3ML SOLN Take 3 mLs by nebulization every 4 (four) hours as needed. 180 mL 1   losartan (COZAAR) 100 MG tablet TAKE ONE TABLET BY MOUTH EVERY DAY 90 tablet 4   metFORMIN (GLUCOPHAGE) 500 MG tablet TAKE ONE TABLET BY MOUTH 2 TIMES A DAY WITH A MEAL. 180 tablet 1   montelukast (SINGULAIR) 10 MG tablet Take 1 tablet (10 mg total) by mouth at bedtime. 30 tablet 3   potassium chloride SA (KLOR-CON M) 20 MEQ tablet TAKE ONE TABLET BY MOUTH 2 TIMES A DAY 180 tablet 4   Rightest GL300 Lancets MISC USE AS DIRECTED. 100 each 0   Semaglutide, 2 MG/DOSE, (OZEMPIC, 2 MG/DOSE,) 8 MG/3ML SOPN Inject 2mg  into the skin once a week 12 mL 3   torsemide (DEMADEX) 20 MG tablet Take 2 tablets (40 mg total) by mouth once daily. May take additional 1 tablet (20mg  total) in the evening if needed. 270 tablet 3   Vitamin D, Ergocalciferol, (DRISDOL) 1.25 MG (50000 UNIT) CAPS capsule Take 1 capsule (50,000 Units total) by mouth every 7 (seven) days. 12 capsule 0   fluticasone-salmeterol (WIXELA INHUB) 500-50 MCG/ACT AEPB Inhale 1 puff into the lungs twice a day in the morning and at bedtime. 180 each 3   No facility-administered medications prior to visit.     Objective:     BP (!) 146/81    Pulse 98   Ht 5\' 9"  (1.753 m)   Wt 273 lb (123.8 kg)   SpO2 94% Comment: RA  BMI 40.32 kg/m   SpO2: 94 % (RA)  Animated/ frustrated amb obese bm nad   HEENT : Oropharynx  clear     Nasal turbinates mild edema   NECK :  without  apparent JVD/ palpable Nodes/TM    LUNGS: no acc muscle use,  Nl contour chest with minimal insp/exp rhonchi bilaterally    CV:  RRR  no s3 or murmur or increase in P2, and no edema   ABD:  obese soft and nontender with nl inspiratory excursion in the supine position. No bruits or organomegaly appreciated   MS:  Nl gait/ ext warm without deformities Or obvious joint restrictions  calf tenderness, cyanosis or clubbing    SKIN: warm and dry without lesions    NEURO:  alert, approp, nl sensorium with  no motor or cerebellar deficits apparent.       Assessment   Asthmatic bronchitis Onset around 2017  - 05/10/2022  After extensive coaching inhaler device,  effectiveness =    25% improved to 755 (Ti too short) > try symbicort 80 2bid and max gerd rx  - FENO 05/10/2022  = 47 on wixella 250  - 05/10/2022   Walked on RA  x  one   lap(s) =  approx 150  ft  @ slow pace, stopped due to tired> sob with lowest 02 sats 92%    DDX of  difficult airways management almost all start with A and  include Adherence, Ace Inhibitors, Acid Reflux, Active Sinus Disease, Alpha 1 Antitripsin deficiency, Anxiety masquerading as Airways dz,  ABPA,  Allergy(esp in young), Aspiration (esp in elderly), Adverse effects of meds,  Active smoking or vaping, A bunch of PE's (a small clot burden can't cause this syndrome unless there is already severe underlying pulm or vascular dz with poor reserve) plus two Bs  = Bronchiectasis and Beta blocker use..and one C= CHF   Adherence is always the initial "prime suspect" and is a multilayered concern that requires a "trust but verify" approach in every patient - starting with knowing how to use medications, especially inhalers, correctly, keeping  up with refills and understanding the fundamental difference between maintenance and prns vs those medications only taken for a very short course and then stopped and not refilled.  -see hfa teaching - need to confirm technique on return  - return with all meds in hand using a trust but verify approach to confirm accurate Medication  Reconciliation The principal here is that until we are certain that the  patients are doing what we've asked, it makes no sense to ask them to do more.   ? Acid (or non-acid) GERD > always difficult to exclude as up to 75% of pts in some series report no assoc GI/ Heartburn symptoms> rec max (24h)  acid suppression and diet restrictions/ reviewed and instructions given in writing.   ? Adverse drug effets: wixella is DPI, needs trial off and replace with symbicort 80 or dulera 100 and work on Camera operator  Allergy/ asthma > continue singulair/ change to symbicort 80 2bid used correctly should suffice noting he has signifiant upper airway component to cough which may limit ICS dose   ? Anxiety/depression/deconditioning  > usually at the bottom of this list of usual suspects but   may interfere with adherence and also interpretation of response or lack thereof to symptom management which can be quite subjective.    ? BB effects > In the setting of respiratory symptoms of unknown etiology,  It would be preferable to use bystolic, the most beta -1  selective Beta blocker available in sample form, with bisoprolol the most selective generic choice  on the market, at least on a trial basis, to make sure the spillover Beta 2 effects of the less specific Beta blockers are not contributing to this patient's symptoms.  >>> leave on coreg until next ov   ?  Chf/ cardiac asthma > may need repeat echo       Morbid obesity (HCC) Body mass index is 40.32 kg/m.  -    Lab Results  Component Value Date   TSH 1.140 11/22/2020      Contributing to doe and risk of GERD >>>    reviewed the need and the process to achieve and maintain neg calorie balance > defer f/u primary care including intermittently monitoring thyroid status      Each maintenance medication was reviewed in detail including emphasizing most importantly the difference between maintenance and prns and under what circumstances the prns are to be triggered using an action plan format where appropriate.  Total time for H and P, chart review, counseling, reviewing hfa device(s) , directly observing portions of ambulatory 02 saturation study/ and generating customized AVS unique to this office visit / same day charting  > 60 min new pt eval                  Sandrea Hughs, MD 05/10/2022

## 2022-05-10 ENCOUNTER — Encounter: Payer: Self-pay | Admitting: Internal Medicine

## 2022-05-10 ENCOUNTER — Other Ambulatory Visit: Payer: Self-pay

## 2022-05-10 ENCOUNTER — Other Ambulatory Visit: Payer: Self-pay | Admitting: Gerontology

## 2022-05-10 ENCOUNTER — Ambulatory Visit (INDEPENDENT_AMBULATORY_CARE_PROVIDER_SITE_OTHER): Payer: Self-pay | Admitting: Internal Medicine

## 2022-05-10 VITALS — BP 146/81 | HR 98 | Ht 69.0 in | Wt 273.0 lb

## 2022-05-10 DIAGNOSIS — R0602 Shortness of breath: Secondary | ICD-10-CM

## 2022-05-10 DIAGNOSIS — I10 Essential (primary) hypertension: Secondary | ICD-10-CM

## 2022-05-10 DIAGNOSIS — J45909 Unspecified asthma, uncomplicated: Secondary | ICD-10-CM | POA: Insufficient documentation

## 2022-05-10 DIAGNOSIS — Z794 Long term (current) use of insulin: Secondary | ICD-10-CM

## 2022-05-10 DIAGNOSIS — J4530 Mild persistent asthma, uncomplicated: Secondary | ICD-10-CM

## 2022-05-10 LAB — POCT EXHALED NITRIC OXIDE: FeNO level (ppb): 47

## 2022-05-10 MED ORDER — PANTOPRAZOLE SODIUM 40 MG PO TBEC
40.0000 mg | DELAYED_RELEASE_TABLET | Freq: Every day | ORAL | 2 refills | Status: DC
  Filled 2022-05-10: qty 30, 30d supply, fill #0

## 2022-05-10 MED ORDER — FAMOTIDINE 20 MG PO TABS
20.0000 mg | ORAL_TABLET | Freq: Every day | ORAL | 11 refills | Status: DC
  Filled 2022-05-10: qty 30, 30d supply, fill #0

## 2022-05-10 MED ORDER — BUDESONIDE-FORMOTEROL FUMARATE 80-4.5 MCG/ACT IN AERO
INHALATION_SPRAY | RESPIRATORY_TRACT | 12 refills | Status: DC
  Filled 2022-05-10: qty 10.2, 30d supply, fill #0

## 2022-05-10 MED FILL — Losartan Potassium Tab 100 MG: ORAL | 90 days supply | Qty: 90 | Fill #1 | Status: AC

## 2022-05-10 MED FILL — Atorvastatin Calcium Tab 10 MG (Base Equivalent): ORAL | 90 days supply | Qty: 90 | Fill #1 | Status: AC

## 2022-05-10 MED FILL — Potassium Chloride Microencapsulated Crys ER Tab 20 mEq: ORAL | 90 days supply | Qty: 180 | Fill #1 | Status: AC

## 2022-05-10 MED FILL — Carvedilol Tab 12.5 MG: ORAL | 90 days supply | Qty: 180 | Fill #1 | Status: AC

## 2022-05-10 NOTE — Patient Instructions (Addendum)
Plan A = Automatic = Always=    Symbicort 80 Take 2 puffs (or Breztri x one puff) first thing in am and then another 2 puffs about 12 hours later.   Work on inhaler technique:  relax and gently blow all the way out then take a nice smooth full deep breath back in, triggering the inhaler at same time you start breathing in.  Hold breath in for at least  5 seconds if you can. Blow out Symbicort 80  thru nose. Rinse and gargle with water when done.  If mouth or throat bother you at all,  try brushing teeth/gums/tongue with arm and hammer toothpaste/ make a slurry and gargle and spit out.  - remember how golfers warm     Plan B = Backup (to supplement plan A, not to replace it) Only use your albuterol inhaler as a rescue medication to be used if you can't catch your breath by resting or doing a relaxed purse lip breathing pattern.  - The less you use it, the better it will work when you need it. - Ok to use the inhaler up to 2 puffs  every 4 hours if you must but call for appointment if use goes up over your usual need - Don't leave home without it !!  (think of it like the spare tire for your car)    Plan C = Crisis (instead of Plan B but only if Plan B stops working) - only use your albuterol nebulizer if you first try Plan B and it fails to help > ok to use the nebulizer up to every 4 hours but if start needing it regularly call for immediate appointment    Pantoprazole (protonix) 40 mg   Take  30-60 min before first meal of the day and Pepcid (famotidine)  20 mg after supper until return to office - this is the best way to tell whether stomach acid is contributing to your problem.    GERD (REFLUX)  is an extremely common cause of respiratory symptoms just like yours , many times with no obvious heartburn at all.    It can be treated with medication, but also with lifestyle changes including elevation of the head of your bed (ideally with 6 -8inch blocks under the headboard of your bed),  Smoking  cessation, avoidance of late meals, excessive alcohol, and avoid fatty foods, chocolate, peppermint, colas, red wine, and acidic juices such as orange juice.  NO MINT OR MENTHOL PRODUCTS SO NO COUGH DROPS (Ludens is ok)  USE SUGARLESS CANDY INSTEAD (Jolley ranchers or Stover's or Environmental manager) or even ice chips will also do - the key is to swallow to prevent all throat clearing. NO OIL BASED VITAMINS - use powdered substitutes.  Avoid fish oil when coughing.    Please schedule a follow up office visit in 4 weeks, sooner if needed  with all medications /inhalers/ solutions in hand so we can verify exactly what you are taking. This includes all medications from all doctors and over the counters

## 2022-05-10 NOTE — Assessment & Plan Note (Signed)
Body mass index is 40.32 kg/m.  -    Lab Results  Component Value Date   TSH 1.140 11/22/2020      Contributing to doe and risk of GERD >>>   reviewed the need and the process to achieve and maintain neg calorie balance > defer f/u primary care including intermittently monitoring thyroid status      Each maintenance medication was reviewed in detail including emphasizing most importantly the difference between maintenance and prns and under what circumstances the prns are to be triggered using an action plan format where appropriate.  Total time for H and P, chart review, counseling, reviewing hfa device(s) , directly observing portions of ambulatory 02 saturation study/ and generating customized AVS unique to this office visit / same day charting  > 60 min new pt eval

## 2022-05-10 NOTE — Assessment & Plan Note (Signed)
Onset around 2017  - 05/10/2022  After extensive coaching inhaler device,  effectiveness =    25% improved to 755 (Ti too short) > try symbicort 80 2bid and max gerd rx  - FENO 05/10/2022  = 47 on wixella 250  - 05/10/2022   Walked on RA  x  one   lap(s) =  approx 150  ft  @ slow pace, stopped due to tired> sob with lowest 02 sats 92%    DDX of  difficult airways management almost all start with A and  include Adherence, Ace Inhibitors, Acid Reflux, Active Sinus Disease, Alpha 1 Antitripsin deficiency, Anxiety masquerading as Airways dz,  ABPA,  Allergy(esp in young), Aspiration (esp in elderly), Adverse effects of meds,  Active smoking or vaping, A bunch of PE's (a small clot burden can't cause this syndrome unless there is already severe underlying pulm or vascular dz with poor reserve) plus two Bs  = Bronchiectasis and Beta blocker use..and one C= CHF   Adherence is always the initial "prime suspect" and is a multilayered concern that requires a "trust but verify" approach in every patient - starting with knowing how to use medications, especially inhalers, correctly, keeping up with refills and understanding the fundamental difference between maintenance and prns vs those medications only taken for a very short course and then stopped and not refilled.  -see hfa teaching - need to confirm technique on return  - return with all meds in hand using a trust but verify approach to confirm accurate Medication  Reconciliation The principal here is that until we are certain that the  patients are doing what we've asked, it makes no sense to ask them to do more.   ? Acid (or non-acid) GERD > always difficult to exclude as up to 75% of pts in some series report no assoc GI/ Heartburn symptoms> rec max (24h)  acid suppression and diet restrictions/ reviewed and instructions given in writing.   ? Adverse drug effets: wixella is DPI, needs trial off and replace with symbicort 80 or dulera 100 and work on Teaching laboratory technician  Allergy/ asthma > continue singulair/ change to symbicort 80 2bid used correctly should suffice noting he has signifiant upper airway component to cough which may limit ICS dose   ? Anxiety/depression/deconditioning  > usually at the bottom of this list of usual suspects but   may interfere with adherence and also interpretation of response or lack thereof to symptom management which can be quite subjective.    ? BB effects > In the setting of respiratory symptoms of unknown etiology,  It would be preferable to use bystolic, the most beta -1  selective Beta blocker available in sample form, with bisoprolol the most selective generic choice  on the market, at least on a trial basis, to make sure the spillover Beta 2 effects of the less specific Beta blockers are not contributing to this patient's symptoms.  >>> leave on coreg until next ov   ? Chf/ cardiac asthma > may need repeat echo

## 2022-05-12 ENCOUNTER — Other Ambulatory Visit: Payer: Self-pay | Admitting: Physician Assistant

## 2022-05-12 ENCOUNTER — Other Ambulatory Visit: Payer: Self-pay

## 2022-05-12 DIAGNOSIS — I10 Essential (primary) hypertension: Secondary | ICD-10-CM

## 2022-05-12 DIAGNOSIS — E1165 Type 2 diabetes mellitus with hyperglycemia: Secondary | ICD-10-CM

## 2022-05-13 ENCOUNTER — Other Ambulatory Visit: Payer: Self-pay

## 2022-05-14 ENCOUNTER — Other Ambulatory Visit: Payer: Self-pay

## 2022-05-14 NOTE — Telephone Encounter (Signed)
Requested medication (s) are due for refill today: Yes  Requested medication (s) are on the active medication list: Yes  Last refill:  Last filled by different provider.  Future visit scheduled: Yes  Notes to clinic:  Protocol indicates lab work is needed.    Requested Prescriptions  Pending Prescriptions Disp Refills   hydrALAZINE (APRESOLINE) 25 MG tablet 180 tablet 1    Sig: TAKE ONE TABLET BY MOUTH 2 TIMES A DAY     Cardiovascular:  Vasodilators Failed - 05/12/2022  8:45 PM      Failed - HCT in normal range and within 360 days    HCT  Date Value Ref Range Status  12/29/2020 42.4 39.0 - 52.0 % Final   Hematocrit  Date Value Ref Range Status  11/22/2020 WILL FOLLOW  Preliminary         Failed - HGB in normal range and within 360 days    Hemoglobin  Date Value Ref Range Status  12/29/2020 13.4 13.0 - 17.0 g/dL Final  78/46/9629 WILL FOLLOW  Preliminary         Failed - RBC in normal range and within 360 days    RBC  Date Value Ref Range Status  12/29/2020 5.07 4.22 - 5.81 MIL/uL Final         Failed - WBC in normal range and within 360 days    WBC  Date Value Ref Range Status  12/29/2020 9.9 4.0 - 10.5 K/uL Final         Failed - PLT in normal range and within 360 days    Platelets  Date Value Ref Range Status  12/29/2020 279 150 - 400 K/uL Final  11/22/2020 WILL FOLLOW  Preliminary         Failed - ANA Screen, Ifa, Serum in normal range and within 360 days    No results found for: "ANA", "ANATITER", "LABANTI"       Failed - Last BP in normal range    BP Readings from Last 1 Encounters:  05/10/22 (!) 146/81         Passed - Valid encounter within last 12 months    Recent Outpatient Visits           1 month ago Essential hypertension   [facility] [place], [place], PA-C       Future Appointments             In 1 week [name], PA-C [facility]             metFORMIN  (GLUCOPHAGE) 500 MG tablet 180 tablet 1    Sig: TAKE ONE TABLET BY MOUTH 2 TIMES A DAY WITH A MEAL.     Endocrinology:  Diabetes - Biguanides Failed - 05/12/2022  8:45 PM      Failed - Cr in normal range and within 360 days    Creatinine  Date Value Ref Range Status  02/07/2014 1.24 0.60 - 1.30 mg/dL Final   Creatinine, Ser  Date Value Ref Range Status  04/18/2022 1.68 (H) 0.76 - 1.27 mg/dL Final   Creatinine, Urine  Date Value Ref Range Status  09/13/2015 306 mg/dL Final         Failed - eGFR in normal range and within 360 days    EGFR (African American)  Date Value Ref Range Status  02/07/2014 >60 >19mL/min Final   GFR calc Af Amer  Date Value Ref Range Status  02/24/2020 57 (  L) >59 mL/min/1.73 Final    Comment:    **In accordance with recommendations from the NKF-ASN Task force,**   [facility] is in the process of updating its eGFR calculation to the   2021 CKD-EPI creatinine equation that estimates kidney function   without a race variable.    EGFR (Non-African Amer.)  Date Value Ref Range Status  02/07/2014 >60 >35mL/min Final    Comment:    eGFR values <14mL/min/1.73 m2 may be an indication of chronic kidney disease (CKD). Calculated eGFR, using the MRDR Study equation, is useful in  patients with stable renal function. The eGFR calculation will not be reliable in acutely ill patients when serum creatinine is changing rapidly. It is not useful in patients on dialysis. The eGFR calculation may not be applicable to patients at the low and high extremes of body sizes, pregnant women, and vegetarians.    GFR, Estimated  Date Value Ref Range Status  12/29/2020 47 (L) >60 mL/min Final    Comment:    (NOTE) Calculated using the CKD-EPI Creatinine Equation (2021)    eGFR  Date Value Ref Range Status  04/18/2022 48 (L) >59 mL/min/1.73 Final         Failed - B12 Level in normal range and within 720 days    Vitamin B-12  Date Value Ref Range Status  04/20/2020  297 232 - 1,245 pg/mL Final         Failed - CBC within normal limits and completed in the last 12 months    WBC  Date Value Ref Range Status  12/29/2020 9.9 4.0 - 10.5 K/uL Final   RBC  Date Value Ref Range Status  12/29/2020 5.07 4.22 - 5.81 MIL/uL Final   Hemoglobin  Date Value Ref Range Status  12/29/2020 13.4 13.0 - 17.0 g/dL Final  16/10/9602 WILL FOLLOW  Preliminary   HCT  Date Value Ref Range Status  12/29/2020 42.4 39.0 - 52.0 % Final   Hematocrit  Date Value Ref Range Status  11/22/2020 WILL FOLLOW  Preliminary   MCHC  Date Value Ref Range Status  12/29/2020 31.6 30.0 - 36.0 g/dL Final   [facility]  Date Value Ref Range Status  12/29/2020 26.4 26.0 - 34.0 pg Final   MCV  Date Value Ref Range Status  12/29/2020 83.6 80.0 - 100.0 fL Final  11/22/2020 WILL FOLLOW  Preliminary  02/07/2014 86 80 - 100 fL Final   No results found for: "PLTCOUNTKUC", "LABPLAT", "POCPLA" RDW  Date Value Ref Range Status  12/29/2020 16.0 (H) 11.5 - 15.5 % Final  11/22/2020 WILL FOLLOW  Preliminary  02/07/2014 16.1 (H) 11.5 - 14.5 % Final         Passed - HBA1C is between 0 and 7.9 and within 180 days    Hgb A1c MFr Bld  Date Value Ref Range Status  04/18/2022 7.4 (H) 4.8 - 5.6 % Final    Comment:             Prediabetes: 5.7 - 6.4          Diabetes: >6.4          Glycemic control for adults with diabetes: <7.0          Passed - Valid encounter within last 6 months    Recent Outpatient Visits           1 month ago Essential hypertension   [place], Edmon Crape, [place]       Future Appointments  In 1 week [name], PA-C [facility], [place]

## 2022-05-15 ENCOUNTER — Other Ambulatory Visit: Payer: Self-pay | Admitting: Gerontology

## 2022-05-15 DIAGNOSIS — E1165 Type 2 diabetes mellitus with hyperglycemia: Secondary | ICD-10-CM

## 2022-05-15 DIAGNOSIS — I10 Essential (primary) hypertension: Secondary | ICD-10-CM

## 2022-05-15 NOTE — Telephone Encounter (Signed)
No longer a patient at [facility]. Patient has insurance and will need to request from his current PCP.

## 2022-05-16 ENCOUNTER — Other Ambulatory Visit: Payer: Self-pay | Admitting: Physician Assistant

## 2022-05-16 ENCOUNTER — Other Ambulatory Visit: Payer: Self-pay

## 2022-05-16 DIAGNOSIS — I10 Essential (primary) hypertension: Secondary | ICD-10-CM

## 2022-05-16 DIAGNOSIS — E1165 Type 2 diabetes mellitus with hyperglycemia: Secondary | ICD-10-CM

## 2022-05-16 NOTE — Telephone Encounter (Signed)
Medication Refill - Medication:  hydrALAZINE (APRESOLINE) 25 MG tablet  metFORMIN (GLUCOPHAGE) 500 MG tablet   Has the patient contacted their pharmacy? Yes.   (Agent: If no, request that the patient contact the pharmacy for the refill. If patient does not wish to contact the pharmacy document the reason why and proceed with request.) (Agent: If yes, when and what did the pharmacy advise?)  Preferred Pharmacy (with phone number or street name):  [facility]  [address] [place] [address]  Phone: [phone] Fax: [phone]   Has the patient been seen for an appointment in the last year OR does the patient have an upcoming appointment? Yes.    Agent: Please be advised that RX refills may take up to 3 business days. We ask that you follow-up with your pharmacy.

## 2022-05-17 ENCOUNTER — Other Ambulatory Visit: Payer: Self-pay

## 2022-05-17 MED ORDER — METFORMIN HCL 500 MG PO TABS
500.0000 mg | ORAL_TABLET | Freq: Two times a day (BID) | ORAL | 1 refills | Status: DC
  Filled 2022-05-17: qty 180, 90d supply, fill #0
  Filled 2022-09-15: qty 180, 90d supply, fill #1

## 2022-05-17 MED ORDER — HYDRALAZINE HCL 25 MG PO TABS
25.0000 mg | ORAL_TABLET | Freq: Two times a day (BID) | ORAL | 1 refills | Status: DC
  Filled 2022-05-17: qty 180, 90d supply, fill #0
  Filled 2022-09-15: qty 180, 90d supply, fill #1

## 2022-05-17 NOTE — Telephone Encounter (Signed)
Requested medication (s) are due for refill today: yes  Requested medication (s) are on the active medication list: yes  Last refill:  both requested meds 08/07/21 # 180 1 RF for both   Future visit scheduled: yes at [facility]  Notes to clinic:  meds were previously filled [facility]   Requested Prescriptions  Pending Prescriptions Disp Refills   hydrALAZINE (APRESOLINE) 25 MG tablet 180 tablet 1    Sig: TAKE ONE TABLET BY MOUTH 2 TIMES A DAY     Cardiovascular:  Vasodilators Failed - 05/16/2022  3:00 PM      Failed - HCT in normal range and within 360 days    HCT  Date Value Ref Range Status  12/29/2020 42.4 39.0 - 52.0 % Final   Hematocrit  Date Value Ref Range Status  11/22/2020 WILL FOLLOW  Preliminary         Failed - HGB in normal range and within 360 days    Hemoglobin  Date Value Ref Range Status  12/29/2020 13.4 13.0 - 17.0 g/dL Final  16/10/9602 WILL FOLLOW  Preliminary         Failed - RBC in normal range and within 360 days    RBC  Date Value Ref Range Status  12/29/2020 5.07 4.22 - 5.81 MIL/uL Final         Failed - WBC in normal range and within 360 days    WBC  Date Value Ref Range Status  12/29/2020 9.9 4.0 - 10.5 K/uL Final         Failed - PLT in normal range and within 360 days    Platelets  Date Value Ref Range Status  12/29/2020 279 150 - 400 K/uL Final  11/22/2020 WILL FOLLOW  Preliminary         Failed - ANA Screen, Ifa, Serum in normal range and within 360 days    No results found for: "ANA", "ANATITER", "LABANTI"       Failed - Last BP in normal range    BP Readings from Last 1 Encounters:  05/10/22 (!) 146/81         Passed - Valid encounter within last 12 months    Recent Outpatient Visits           1 month ago Essential hypertension   [place] [facility] [place], [place], PA-C       Future Appointments             In 1 week [name], PA-C [facility]              metFORMIN (GLUCOPHAGE) 500 MG tablet 180 tablet 1    Sig: TAKE ONE TABLET BY MOUTH 2 TIMES A DAY WITH A MEAL.     Endocrinology:  Diabetes - Biguanides Failed - 05/16/2022  3:00 PM      Failed - Cr in normal range and within 360 days    Creatinine  Date Value Ref Range Status  02/07/2014 1.24 0.60 - 1.30 mg/dL Final   Creatinine, Ser  Date Value Ref Range Status  04/18/2022 1.68 (H) 0.76 - 1.27 mg/dL Final   Creatinine, Urine  Date Value Ref Range Status  09/13/2015 306 mg/dL Final         Failed - eGFR in normal range and within 360 days    EGFR (African American)  Date Value Ref Range Status  02/07/2014 >60 >20mL/min Final   GFR calc  Af Amer  Date Value Ref Range Status  02/24/2020 57 (L) >59 mL/min/1.73 Final    Comment:    **In accordance with recommendations from the NKF-ASN Task force,**   [facility] is in the process of updating its eGFR calculation to the   2021 CKD-EPI creatinine equation that estimates kidney function   without a race variable.    EGFR (Non-African Amer.)  Date Value Ref Range Status  02/07/2014 >60 >1mL/min Final    Comment:    eGFR values <74mL/min/1.73 m2 may be an indication of chronic kidney disease (CKD). Calculated eGFR, using the MRDR Study equation, is useful in  patients with stable renal function. The eGFR calculation will not be reliable in acutely ill patients when serum creatinine is changing rapidly. It is not useful in patients on dialysis. The eGFR calculation may not be applicable to patients at the low and high extremes of body sizes, pregnant women, and vegetarians.    GFR, Estimated  Date Value Ref Range Status  12/29/2020 47 (L) >60 mL/min Final    Comment:    (NOTE) Calculated using the CKD-EPI Creatinine Equation (2021)    eGFR  Date Value Ref Range Status  04/18/2022 48 (L) >59 mL/min/1.73 Final         Failed - B12 Level in normal range and within 720 days    Vitamin B-12  Date Value  Ref Range Status  04/20/2020 297 232 - 1,245 pg/mL Final         Failed - CBC within normal limits and completed in the last 12 months    WBC  Date Value Ref Range Status  12/29/2020 9.9 4.0 - 10.5 K/uL Final   RBC  Date Value Ref Range Status  12/29/2020 5.07 4.22 - 5.81 MIL/uL Final   Hemoglobin  Date Value Ref Range Status  12/29/2020 13.4 13.0 - 17.0 g/dL Final  29/56/2130 WILL FOLLOW  Preliminary   HCT  Date Value Ref Range Status  12/29/2020 42.4 39.0 - 52.0 % Final   Hematocrit  Date Value Ref Range Status  11/22/2020 WILL FOLLOW  Preliminary   MCHC  Date Value Ref Range Status  12/29/2020 31.6 30.0 - 36.0 g/dL Final   [facility]  Date Value Ref Range Status  12/29/2020 26.4 26.0 - 34.0 pg Final   MCV  Date Value Ref Range Status  12/29/2020 83.6 80.0 - 100.0 fL Final  11/22/2020 WILL FOLLOW  Preliminary  02/07/2014 86 80 - 100 fL Final   No results found for: "PLTCOUNTKUC", "LABPLAT", "POCPLA" RDW  Date Value Ref Range Status  12/29/2020 16.0 (H) 11.5 - 15.5 % Final  11/22/2020 WILL FOLLOW  Preliminary  02/07/2014 16.1 (H) 11.5 - 14.5 % Final         Passed - HBA1C is between 0 and 7.9 and within 180 days    Hgb A1c MFr Bld  Date Value Ref Range Status  04/18/2022 7.4 (H) 4.8 - 5.6 % Final    Comment:             Prediabetes: 5.7 - 6.4          Diabetes: >6.4          Glycemic control for adults with diabetes: <7.0          Passed - Valid encounter within last 6 months    Recent Outpatient Visits           1 month ago Essential hypertension   [place] [facility]  [name], PA-C       Future Appointments             In 1 week [name], PA-C [facility], [place]

## 2022-05-24 ENCOUNTER — Ambulatory Visit (INDEPENDENT_AMBULATORY_CARE_PROVIDER_SITE_OTHER): Payer: PRIVATE HEALTH INSURANCE | Admitting: Physician Assistant

## 2022-05-24 ENCOUNTER — Encounter: Payer: Self-pay | Admitting: Physician Assistant

## 2022-05-24 VITALS — BP 129/78 | HR 104

## 2022-05-24 DIAGNOSIS — I10 Essential (primary) hypertension: Secondary | ICD-10-CM | POA: Diagnosis not present

## 2022-05-24 DIAGNOSIS — E118 Type 2 diabetes mellitus with unspecified complications: Secondary | ICD-10-CM | POA: Diagnosis not present

## 2022-05-24 DIAGNOSIS — G4733 Obstructive sleep apnea (adult) (pediatric): Secondary | ICD-10-CM | POA: Diagnosis not present

## 2022-05-24 DIAGNOSIS — J301 Allergic rhinitis due to pollen: Secondary | ICD-10-CM

## 2022-05-24 DIAGNOSIS — J45901 Unspecified asthma with (acute) exacerbation: Secondary | ICD-10-CM

## 2022-05-24 DIAGNOSIS — R0602 Shortness of breath: Secondary | ICD-10-CM

## 2022-05-24 DIAGNOSIS — J449 Chronic obstructive pulmonary disease, unspecified: Secondary | ICD-10-CM

## 2022-05-24 DIAGNOSIS — J4530 Mild persistent asthma, uncomplicated: Secondary | ICD-10-CM

## 2022-05-24 DIAGNOSIS — K219 Gastro-esophageal reflux disease without esophagitis: Secondary | ICD-10-CM

## 2022-05-24 DIAGNOSIS — E782 Mixed hyperlipidemia: Secondary | ICD-10-CM | POA: Diagnosis not present

## 2022-05-24 NOTE — Progress Notes (Unsigned)
I,Sha'taria Tyson,acting as a Neurosurgeon for [employer], PA-C.,have documented all relevant documentation on the behalf of [name], PA-C,as directed by  [employer], PA-C while in the presence of [employer], PA-C.   Established patient visit   Patient: Bradley Mckenzie.   DOB: [birth year]   56 y.o. Male  MRN: [id number] Visit Date: 05/24/2022  Today's healthcare provider: [name], PA-C   No chief complaint on file.  Subjective    HPI  Diabetes Mellitus Type II, follow-up  Lab Results  Component Value Date   HGBA1C 7.4 (H) 04/18/2022   HGBA1C 7.9 (H) 11/14/2021   HGBA1C 7.9 (H) 08/08/2021   Last seen for diabetes 6 weeks ago.  Management since then includes continuing the same treatment.  Home blood sugar records: fasting range: 80-low 100's, last week a random 172  Episodes of hypoglycemia? No    Current insulin regiment: Lantus 31 units, ozempic , metformin Most Recent Eye Exam: 2 years ago  --------------------------------------------------------------------------------------------------- Hypertension, follow-up  BP Readings from Last 3 Encounters:  05/24/22 129/78  05/10/22 (!) 146/81  04/30/22 (!) 146/85   Wt Readings from Last 3 Encounters:  05/10/22 273 lb (123.8 kg)  04/30/22 276 lb 4 oz (125.3 kg)  04/12/22 276 lb 11.2 oz (125.5 kg)     He was last seen for hypertension 6 weeks ago. Coreg, hydralzine 25 mg cozaar 100mg , torsemide 20mg  BP at that visit was 117/67. Management since that visit includes continue current treatment. Outside blood pressures are {not being checked}. --------------------------------------------------------------------------------------------------- Lipid/Cholesterol, follow-up  Last Lipid Panel: Lab Results  Component Value Date   CHOL 154 04/18/2022   LDLCALC 87 04/18/2022   HDL 46 04/18/2022   TRIG 118 04/18/2022    He was last seen for this 6 weeks ago. Atorvastatin 10mg  Management since that visit  includes continue current treatment. Symptoms: Yes appetite changes No foot ulcerations  No chest pain Yes chest pressure/discomfort  Yes dyspnea Yes orthopnea  Yes fatigue No lower extremity edema  Yes palpitations Yes paroxysmal nocturnal dyspnea  No nausea Yes numbness or tingling of extremity  No polydipsia No polyuria  No speech difficulty Yes syncope   Last metabolic panel Lab Results  Component Value Date   GLUCOSE 123 (H) 04/18/2022   NA 140 04/18/2022   K 4.0 04/18/2022   BUN 16 04/18/2022   CREATININE 1.68 (H) 04/18/2022   EGFR 48 (L) 04/18/2022   GFRNONAA 47 (L) 12/29/2020   CALCIUM 9.6 04/18/2022   AST 15 04/18/2022   ALT 27 04/18/2022   The 10-year ASCVD risk score (Arnett DK, et al., 2019) is: 19.2%  ---------------------------------------------------------------------------------------------------   Medications: Outpatient Medications Prior to Visit  Medication Sig   Accu-Chek Softclix Lancets lancets Use to check blood sugar three times daily   albuterol (PROAIR HFA) 108 (90 Base) MCG/ACT inhaler Inhale 2 puffs into the lungs every 6 (six) hours as needed for wheezing or shortness of breath.   atorvastatin (LIPITOR) 10 MG tablet TAKE ONE TABLET BY MOUTH EVERY DAY   Blood Glucose Monitoring Suppl (ACCU-CHEK GUIDE) w/Device KIT 1 each by Does not apply route 3 (three) times daily.   Blood Pressure Monitoring (BLOOD PRESSURE CUFF) MISC USE AS DIRECTED ONCE DAILY AT 12 NOON.   budesonide-formoterol (SYMBICORT) 80-4.5 MCG/ACT inhaler Inhale 2 puffs into the lungs every morning AND 2 puffs every evening (about 12 hours later)   carvedilol (COREG) 12.5 MG tablet Take 1 tablet (12.5 mg total) by mouth 2 (two)  times daily with a meal.   cetirizine (ZYRTEC) 10 MG tablet Take 1 tablet (10 mg total) by mouth daily.   dapagliflozin propanediol (FARXIGA) 10 MG TABS tablet Take 10 mg by mouth daily.   famotidine (PEPCID) 20 MG tablet Take 1 tablet (20 mg total) by mouth  daily after supper   glucose blood (ACCU-CHEK GUIDE) test strip Use as instructed   hydrALAZINE (APRESOLINE) 25 MG tablet Take 1 tablet (25 mg total) by mouth 2 (two) times daily.   insulin glargine (LANTUS) 100 UNIT/ML Solostar Pen Inject 31 Units into the skin once daily.   Insulin Pen Needle 32G X 4 MM MISC Use as directed   ipratropium-albuterol (DUONEB) 0.5-2.5 (3) MG/3ML SOLN Take 3 mLs by nebulization every 4 (four) hours as needed.   losartan (COZAAR) 100 MG tablet TAKE ONE TABLET BY MOUTH EVERY DAY   metFORMIN (GLUCOPHAGE) 500 MG tablet Take 1 tablet (500 mg total) by mouth 2 (two) times daily with a meal.   montelukast (SINGULAIR) 10 MG tablet Take 1 tablet (10 mg total) by mouth at bedtime.   pantoprazole (PROTONIX) 40 MG tablet Take 1 tablet (40 mg total) by mouth daily. Take 30-60 min before first meal of the day   potassium chloride SA (KLOR-CON M) 20 MEQ tablet TAKE ONE TABLET BY MOUTH 2 TIMES A DAY   Rightest GL300 Lancets MISC USE AS DIRECTED.   Semaglutide, 2 MG/DOSE, (OZEMPIC, 2 MG/DOSE,) 8 MG/3ML SOPN Inject 2mg  into the skin once a week   torsemide (DEMADEX) 20 MG tablet Take 2 tablets (40 mg total) by mouth once daily. May take additional 1 tablet (20mg  total) in the evening if needed.   Vitamin D, Ergocalciferol, (DRISDOL) 1.25 MG (50000 UNIT) CAPS capsule Take 1 capsule (50,000 Units total) by mouth every 7 (seven) days.   gabapentin (NEURONTIN) 300 MG capsule Take 1 capsule (300 mg total) by mouth daily. (Patient not taking: Reported on 05/24/2022)   No facility-administered medications prior to visit.    Review of Systems  {Labs  Heme  Chem  Endocrine  Serology  Results Review (optional):23779}   Objective    BP 129/78 (BP Location: Right Arm, Patient Position: Sitting, Cuff Size: Normal)   Pulse (!) 104   SpO2 98%  {Show previous vital signs (optional):23777}  Physical Exam  ***  No results found for any visits on 05/24/22.  Assessment & Plan      Pulmonology, beck to numbness in left  Nuerosurgery Podiatry MRI  colonoscopy  No follow-ups on file.      {provider attestation***:1}   [name], PA-C  [facility] [phone] (phone) [phone] (fax)  [facility]

## 2022-05-26 NOTE — Assessment & Plan Note (Signed)
Chronic and stable The 10-year ASCVD risk score (Arnett DK, et al., 2019) is: 19.2% Will need to increase his current dose of atorvastatin to 20 mg. Pt prefers to wait till next FU visit. Continue low fat diet and daily exercise Will check in 3 mo

## 2022-05-26 NOTE — Assessment & Plan Note (Addendum)
Chronic. ON PE, rhonchi on auscultation Continue his current regimen, see below Pt needs to FU with pulmonology. Had a counsultation with Dr. Sherene Sires, see above from 4.26.24, however, pt states that Dr. Sherene Sires 's clinic will not accept his insurance and he needs to find an another provider. Will check with office scheduler if a new referral should be placed.

## 2022-05-26 NOTE — Assessment & Plan Note (Addendum)
Chronic and stable based on his current BS at home, his last A1C was 7.4 on 4.4.24 Will reassess in 3 mo Continue his current regimen, see below Continue to adhere to low carb diet and daily exercise Advised to schedule with eye exam, if he agrees, a referral will be placed

## 2022-05-26 NOTE — Assessment & Plan Note (Signed)
Chronic and stable. His BP today WNL, at goal He is on a multiple drug therapy for BP control and HF control He is FU for HF with HF clinic, last visit was 4.16.24 Advised to continue his current regimen Advised sodium restriction and regular exercise

## 2022-06-10 ENCOUNTER — Other Ambulatory Visit: Payer: Self-pay

## 2022-06-17 NOTE — Progress Notes (Deleted)
Bradley Ejay Farland., male    DOB: Dec 20, 1965    MRN: [id number]   Brief patient profile:  87  yobf never really smoked regularly served in [employer] referred to pulmonary clinic in [place]  05/10/2022 by [name] PA  for sob/ cough    Baseline wt 248 around 2012  History of Present Illness  05/10/2022  Pulmonary/ 1st office eval/ [name] / [name] - wt 273/  on maint wixella and prn saba but very poor Radiation protection practitioner Complaint  Patient presents with   Consult    Pt consult he says that he has been experiencing SOB >37yrs was unable to narrow down a timeframe of it worsening. States he smoke around the age of [age]   Dyspnea:  used to walk any where he wanted but around 2017 noted worsened now has go to slower than  others.  Has to stop on 2nd landing  to catch breath since moving into new appt. Cough: tends to be worse with ex and variably at hs/ non productive Sleep: flat bed x 1 pillows   7nights/ a week / places cpap always sob and sometimes coughing / has not tried recliner  SABA use: w/in on hour of ov Rec Plan A = Automatic = Always=    Symbicort 80 Take 2 puffs (or Breztri x one puff) first thing in am and then another 2 puffs about 12 hours later.  Work on inhaler technique:   Plan B = Backup (to supplement plan A, not to replace it) Only use your albuterol inhaler as a rescue medication Plan C = Crisis (instead of Plan B but only if Plan B stops working) - only use your albuterol nebulizer if you first try Plan B Pantoprazole (protonix) 40 mg   Take  30-60 min before first meal of the day and Pepcid (famotidine)  20 mg after supper until return to office  GERD diet reviewed, bed blocks rec    Please schedule a follow up office visit in 4 weeks, sooner if needed  with all medications /inhalers/ solutions in hand     06/18/2022  f/u [facility]/Jordy Verba re: *** maint on ***  did *** bring meds  No chief complaint on file.   Dyspnea:  *** Cough: *** Sleeping: *** SABA  use: *** 02: *** Covid status: *** Lung cancer screening: ***   No obvious day to day or daytime variability or assoc excess/ purulent sputum or mucus plugs or hemoptysis or cp or chest tightness, subjective wheeze or overt sinus or hb symptoms.   *** without nocturnal  or early am exacerbation  of respiratory  c/o's or need for noct saba. Also denies any obvious fluctuation of symptoms with weather or environmental changes or other aggravating or alleviating factors except as outlined above   No unusual exposure hx or h/o childhood pna/ asthma or knowledge of premature birth.  Current Allergies, Complete Past Medical History, Past Surgical History, Family History, and Social History were reviewed in [name] record.  ROS  The following are not active complaints unless bolded Hoarseness, sore throat, dysphagia, dental problems, itching, sneezing,  nasal congestion or discharge of excess mucus or purulent secretions, ear ache,   fever, chills, sweats, unintended wt loss or wt gain, classically pleuritic or exertional cp,  orthopnea pnd or arm/hand swelling  or leg swelling, presyncope, palpitations, abdominal pain, anorexia, nausea, vomiting, diarrhea  or change in bowel habits or change in bladder habits, change in stools or  change in urine, dysuria, hematuria,  rash, arthralgias, visual complaints, headache, numbness, weakness or ataxia or problems with walking or coordination,  change in mood or  memory.        No outpatient medications have been marked as taking for the 06/18/22 encounter (Appointment) with Nyoka Cowden, MD.               Past Medical History:  Diagnosis Date   Allergy    Asthma    Cardiomyopathy (HCC) 2016   a. 01/2014 Echo: EF 25-30%;  b. ? ischemic vs non-ischemic.  He's never had an ischemic eval.   Chronic combined systolic (congestive) and diastolic (congestive) heart failure (HCC)    a. 01/2014 Echo: EF 25-30%. b. Echo 03/2015:  Improved EF of 40-45%   COPD (chronic obstructive pulmonary disease) (HCC)    Depression    Diabetes mellitus without complication (HCC) 2018   started med therapy approx 2018   Hyperlipidemia    Hypertension    Hypertensive cardiomyopathy (HCC) 06/30/2015   Hypertensive heart disease    a. Since his 60's.   Morbid obesity (HCC)    Obstructive sleep apnea    a. Has not used CPAP since ~ 2012.   Sleep apnea        Objective:     Wt Readings from Last 3 Encounters:  05/10/22 273 lb (123.8 kg)  04/30/22 276 lb 4 oz (125.3 kg)  04/12/22 276 lb 11.2 oz (125.5 kg)      Vital signs reviewed  06/18/2022  - Note at rest 02 sats  ***% on ***   General appearance:    ***           Assessment   Asthmatic bronchitis

## 2022-06-18 ENCOUNTER — Ambulatory Visit: Payer: PRIVATE HEALTH INSURANCE | Admitting: Internal Medicine

## 2022-06-20 ENCOUNTER — Other Ambulatory Visit: Payer: Self-pay

## 2022-06-21 ENCOUNTER — Other Ambulatory Visit: Payer: Self-pay

## 2022-07-11 ENCOUNTER — Other Ambulatory Visit: Payer: Self-pay

## 2022-07-11 ENCOUNTER — Telehealth: Payer: Self-pay | Admitting: Physician Assistant

## 2022-07-11 MED ORDER — BLOOD GLUCOSE TEST VI STRP
1.0000 | ORAL_STRIP | Freq: Three times a day (TID) | 0 refills | Status: AC
  Filled 2022-07-11: qty 100, 30d supply, fill #0

## 2022-07-11 MED ORDER — LANCET DEVICE MISC
1.0000 | Freq: Three times a day (TID) | 0 refills | Status: AC
  Filled 2022-07-11: qty 1, 30d supply, fill #0

## 2022-07-11 MED ORDER — BLOOD GLUCOSE MONITORING SUPPL DEVI
1.0000 | Freq: Three times a day (TID) | 0 refills | Status: AC
  Filled 2022-07-11: qty 1, fill #0

## 2022-07-11 MED ORDER — INSULIN PEN NEEDLE 30G X 5 MM MISC
1.0000 | Freq: Every day | 6 refills | Status: DC
  Filled 2022-07-11: qty 100, 90d supply, fill #0
  Filled 2022-10-25: qty 100, 90d supply, fill #1
  Filled 2023-03-11: qty 100, 90d supply, fill #2
  Filled 2023-05-15 – 2023-05-26 (×2): qty 100, 90d supply, fill #3

## 2022-07-11 MED ORDER — LANCETS MISC. MISC
1.0000 | Freq: Three times a day (TID) | 0 refills | Status: AC
  Filled 2022-07-11: qty 100, 30d supply, fill #0

## 2022-07-11 NOTE — Telephone Encounter (Signed)
Patient is asking for more comfortable needles instead of the ones he has.  He is asking for BD Autoshield duo .30mm X 3/16" to be sent into [facility] community pharmacy.  He also wans to go back to using his old glucometer(Bionime GM550) and needs strips for it to be sent in.

## 2022-07-12 ENCOUNTER — Other Ambulatory Visit: Payer: Self-pay

## 2022-07-15 ENCOUNTER — Other Ambulatory Visit: Payer: Self-pay

## 2022-07-16 ENCOUNTER — Other Ambulatory Visit: Payer: Self-pay

## 2022-07-23 ENCOUNTER — Other Ambulatory Visit: Payer: Self-pay

## 2022-07-23 ENCOUNTER — Other Ambulatory Visit: Payer: Self-pay | Admitting: Physician Assistant

## 2022-07-24 ENCOUNTER — Other Ambulatory Visit: Payer: Self-pay

## 2022-07-24 MED ORDER — INSULIN GLARGINE 100 UNIT/ML SOLOSTAR PEN
31.0000 [IU] | PEN_INJECTOR | Freq: Every day | SUBCUTANEOUS | 1 refills | Status: DC
  Filled 2022-07-24 – 2022-07-25 (×2): qty 30, 96d supply, fill #0
  Filled 2022-12-16: qty 15, 48d supply, fill #1
  Filled 2023-02-28: qty 15, 48d supply, fill #2

## 2022-07-24 NOTE — Telephone Encounter (Signed)
Requested Prescriptions  Pending Prescriptions Disp Refills   insulin glargine (LANTUS) 100 UNIT/ML Solostar Pen 30 mL 1    Sig: Inject 31 units into the skin once daily     Endocrinology:  Diabetes - Insulins Passed - 07/23/2022  4:45 PM      Passed - HBA1C is between 0 and 7.9 and within 180 days    Hgb A1c MFr Bld  Date Value Ref Range Status  04/18/2022 7.4 (H) 4.8 - 5.6 % Final    Comment:             Prediabetes: 5.7 - 6.4          Diabetes: >6.4          Glycemic control for adults with diabetes: <7.0          Passed - Valid encounter within last 6 months    Recent Outpatient Visits           2 months ago Essential hypertension   [place] [facility] [place], [place], PA-C   3 months ago Essential hypertension   [address] [facility] [place], [place], PA-C       Future Appointments             In 1 month [place], [place], PA-C [facility]

## 2022-07-25 ENCOUNTER — Other Ambulatory Visit (HOSPITAL_COMMUNITY): Payer: Self-pay

## 2022-07-25 ENCOUNTER — Other Ambulatory Visit: Payer: Self-pay

## 2022-07-30 ENCOUNTER — Other Ambulatory Visit: Payer: Self-pay

## 2022-07-30 ENCOUNTER — Encounter: Payer: Self-pay | Admitting: Family

## 2022-07-30 ENCOUNTER — Ambulatory Visit: Payer: No Typology Code available for payment source | Attending: Family | Admitting: Family

## 2022-07-30 VITALS — BP 117/68 | HR 108 | Wt 283.2 lb

## 2022-07-30 DIAGNOSIS — I5022 Chronic systolic (congestive) heart failure: Secondary | ICD-10-CM | POA: Diagnosis present

## 2022-07-30 DIAGNOSIS — E785 Hyperlipidemia, unspecified: Secondary | ICD-10-CM | POA: Insufficient documentation

## 2022-07-30 DIAGNOSIS — R002 Palpitations: Secondary | ICD-10-CM | POA: Insufficient documentation

## 2022-07-30 DIAGNOSIS — I428 Other cardiomyopathies: Secondary | ICD-10-CM | POA: Insufficient documentation

## 2022-07-30 DIAGNOSIS — Z833 Family history of diabetes mellitus: Secondary | ICD-10-CM | POA: Insufficient documentation

## 2022-07-30 DIAGNOSIS — R42 Dizziness and giddiness: Secondary | ICD-10-CM | POA: Diagnosis not present

## 2022-07-30 DIAGNOSIS — J4489 Other specified chronic obstructive pulmonary disease: Secondary | ICD-10-CM | POA: Diagnosis not present

## 2022-07-30 DIAGNOSIS — Z8249 Family history of ischemic heart disease and other diseases of the circulatory system: Secondary | ICD-10-CM | POA: Diagnosis not present

## 2022-07-30 DIAGNOSIS — I110 Hypertensive heart disease with heart failure: Secondary | ICD-10-CM | POA: Insufficient documentation

## 2022-07-30 DIAGNOSIS — Z794 Long term (current) use of insulin: Secondary | ICD-10-CM | POA: Diagnosis not present

## 2022-07-30 DIAGNOSIS — I10 Essential (primary) hypertension: Secondary | ICD-10-CM | POA: Diagnosis not present

## 2022-07-30 DIAGNOSIS — E119 Type 2 diabetes mellitus without complications: Secondary | ICD-10-CM | POA: Insufficient documentation

## 2022-07-30 DIAGNOSIS — I340 Nonrheumatic mitral (valve) insufficiency: Secondary | ICD-10-CM | POA: Insufficient documentation

## 2022-07-30 DIAGNOSIS — Z79899 Other long term (current) drug therapy: Secondary | ICD-10-CM | POA: Insufficient documentation

## 2022-07-30 DIAGNOSIS — Z7951 Long term (current) use of inhaled steroids: Secondary | ICD-10-CM | POA: Diagnosis not present

## 2022-07-30 DIAGNOSIS — R0789 Other chest pain: Secondary | ICD-10-CM | POA: Insufficient documentation

## 2022-07-30 DIAGNOSIS — G4733 Obstructive sleep apnea (adult) (pediatric): Secondary | ICD-10-CM | POA: Diagnosis not present

## 2022-07-30 DIAGNOSIS — J45909 Unspecified asthma, uncomplicated: Secondary | ICD-10-CM

## 2022-07-30 DIAGNOSIS — Z7984 Long term (current) use of oral hypoglycemic drugs: Secondary | ICD-10-CM | POA: Insufficient documentation

## 2022-07-30 MED ORDER — NEBIVOLOL HCL 5 MG PO TABS
5.0000 mg | ORAL_TABLET | Freq: Every day | ORAL | 3 refills | Status: DC
  Filled 2022-07-30: qty 90, 90d supply, fill #0
  Filled 2023-01-18: qty 90, 90d supply, fill #1
  Filled 2023-04-17: qty 90, 90d supply, fill #2
  Filled 2023-06-16 – 2023-07-24 (×2): qty 90, 90d supply, fill #3

## 2022-07-30 NOTE — Patient Instructions (Signed)
Stop taking carvedilol and will begin bystolic as 5mg  once daily.

## 2022-07-30 NOTE — Progress Notes (Signed)
PCP: [name], PA (last seen 05/24) Primary Cardiologist:  HPI:  Mr.Ridolfi is a 57 y/o male who has a history of diabetes, HTN, obstructive sleep apnea, depression, hyperlipidemia, morbid obesity and heart failure with a reduced ejection fraction. Remote tobacco exposure.   Echo 09/02/15: EF of 30-35%, mild mitral regurgitation and no aortic stenosis, EF has declined from 40-45% in March 2017 but he had been inconsistent taking his medications & not using his CPAP during this time. Echo 10/28/2018: EF of 60-65% along with mildly elevated PA pressure.   Was in the ED 12/18/21 due to LBP radiating down left leg. Was in the ED 12/09/21 due to left foot numbness.   Returns today for a HF follow-up visit with a chief complaint of moderate SOB with minimal exertion. Chronic in nature although symptoms fluctuate. Does get worse with the hot/ humid weather. Has associated fatigue, intermittent chest pain, palpitations, lightheaded at times especially with sudden position changes and chronic difficulty sleeping along with this. Will be tired during the day and then gets to bed and is wide awake. Denies cough. Has not taken his meds yet today.   Says that he has gotten approved for disability but does not have a new insurance card tied to this yet. Has not heard anything regarding a home sleep study or whether his echo was approved.   Still living on the 3rd floor so has a lot of steps to walk up. Occasionally uses a treadmill.   ROS: All systems negative except as listed in HPI, PMH and Problem List.  SH:  Social History   Socioeconomic History   Marital status: Married    Spouse name: Not on file   Number of children: 2   Years of education: 12   Highest education level: GED or equivalent  Occupational History   Occupation: unemployed    Comment: hard time getting disability, denied medicaid   Tobacco Use   Smoking status: Never   Smokeless tobacco: Never   Tobacco comments:    Quit  in his 73's.  Vaping Use   Vaping status: Never Used  Substance and Sexual Activity   Alcohol use: Not Currently    Comment: rarely alcohol use at special events   Drug use: Not Currently    Comment: Used marijuana in his teens.   Sexual activity: Not Currently  Other Topics Concern   Not on file  Social History Narrative   Lives in [place] with his wife. Wife no longer working; now on disability. Have stable housing at group home that his wife used to work at. Sometimes struggles with transportation but not desperate. Wife has working car, his doesn't work. Had food stamps but cut off after 3 months. Wasn't eligible after that bc not able to work. Has been denied twice from disability. He was not able to obtain medicaid   Social Determinants of Health   Financial Resource Strain: Low Risk  (11/28/2021)   Overall Financial Resource Strain (CARDIA)    Difficulty of Paying Living Expenses: Not hard at all  Food Insecurity: No Food Insecurity (05/17/2021)   Hunger Vital Sign    Worried About Running Out of Food in the Last Year: Never true    Ran Out of Food in the Last Year: Never true  Transportation Needs: No Transportation Needs (05/17/2021)   PRAPARE - Administrator, Civil Service (Medical): No    Lack of Transportation (Non-Medical): No  Physical Activity: Inactive (11/28/2021)   Exercise  Vital Sign    Days of Exercise per Week: 0 days    Minutes of Exercise per Session: 0 min  Stress: No Stress Concern Present (11/28/2021)   [employer] of Occupational Health - Occupational Stress Questionnaire    Feeling of Stress : Only a little  Social Connections: Moderately Integrated (11/28/2021)   Social Connection and Isolation Panel [NHANES]    Frequency of Communication with Friends and Family: More than three times a week    Frequency of Social Gatherings with Friends and Family: Once a week    Attends Religious Services: 1 to 4 times per year    Active Member of  [employer] or Organizations: No    Attends Banker Meetings: Never    Marital Status: Married  Catering manager Violence: Not At Risk (11/28/2021)   Humiliation, Afraid, Rape, and Kick questionnaire    Fear of Current or Ex-Partner: No    Emotionally Abused: No    Physically Abused: No    Sexually Abused: No    FH:  Family History  Problem Relation Age of Onset   Heart disease Mother        alive & well.   Heart attack Father 31       died @ 20 of cancer.   Diabetes Father    Cancer Father        "in his stomach"   Cancer Sister    Cancer Paternal Uncle        strong FH malignancy multiple relavies     Past Medical History:  Diagnosis Date   Allergy    Asthma    Cardiomyopathy (HCC) 2016   a. 01/2014 Echo: EF 25-30%;  b. ? ischemic vs non-ischemic.  He's never had an ischemic eval.   Chronic combined systolic (congestive) and diastolic (congestive) heart failure (HCC)    a. 01/2014 Echo: EF 25-30%. b. Echo 03/2015: Improved EF of 40-45%   COPD (chronic obstructive pulmonary disease) (HCC)    Depression    Diabetes mellitus without complication (HCC) 2018   started med therapy approx 2018   Hyperlipidemia    Hypertension    Hypertensive cardiomyopathy (HCC) 06/30/2015   Hypertensive heart disease    a. Since his 89's.   Morbid obesity (HCC)    Obstructive sleep apnea    a. Has not used CPAP since ~ 2012.   Sleep apnea     Current Outpatient Medications  Medication Sig Dispense Refill   Accu-Chek Softclix Lancets lancets Use to check blood sugar three times daily 100 each 0   albuterol (PROAIR HFA) 108 (90 Base) MCG/ACT inhaler Inhale 2 puffs into the lungs every 6 (six) hours as needed for wheezing or shortness of breath. 8.5 g 4   atorvastatin (LIPITOR) 10 MG tablet TAKE ONE TABLET BY MOUTH EVERY DAY 90 tablet 4   Blood Glucose Monitoring Suppl (ACCU-CHEK GUIDE) w/Device KIT 1 each by Does not apply route 3 (three) times daily. 1 kit 12   Blood Glucose  Monitoring Suppl DEVI 1 each by Does not apply route in the morning, at noon, and at bedtime. May substitute to any manufacturer covered by patient's insurance. 1 each 0   Blood Pressure Monitoring (BLOOD PRESSURE CUFF) MISC USE AS DIRECTED ONCE DAILY AT 12 NOON. 1 each 0   budesonide-formoterol (SYMBICORT) 80-4.5 MCG/ACT inhaler Inhale 2 puffs into the lungs every morning AND 2 puffs every evening (about 12 hours later) 10.2 g 12   carvedilol (COREG) 12.5 MG  tablet Take 1 tablet (12.5 mg total) by mouth 2 (two) times daily with a meal. 180 tablet 3   cetirizine (ZYRTEC) 10 MG tablet Take 1 tablet (10 mg total) by mouth daily. 90 tablet 1   dapagliflozin propanediol (FARXIGA) 10 MG TABS tablet Take 10 mg by mouth daily.     famotidine (PEPCID) 20 MG tablet Take 1 tablet (20 mg total) by mouth daily after supper 30 tablet 11   gabapentin (NEURONTIN) 300 MG capsule Take 1 capsule (300 mg total) by mouth daily. (Patient not taking: Reported on 05/24/2022) 30 capsule 0   glucose blood (ACCU-CHEK GUIDE) test strip Use as instructed 100 each 12   Glucose Blood (BLOOD GLUCOSE TEST STRIPS) STRP 1 each by In Vitro route in the morning, at noon, and at bedtime. May substitute to any manufacturer covered by patient's insurance.  Patient has asked for Bionime GM550 This is not in my database 100 strip 0   hydrALAZINE (APRESOLINE) 25 MG tablet Take 1 tablet (25 mg total) by mouth 2 (two) times daily. 180 tablet 1   insulin glargine (LANTUS) 100 UNIT/ML Solostar Pen Inject 31 Units into the skin once daily. 15 mL 4   insulin glargine (LANTUS) 100 UNIT/ML Solostar Pen Inject 31 Units into the skin daily. 30 mL 1   Insulin Pen Needle 30G X 5 MM MISC 1 each by Does not apply route daily. 100 each 6   Insulin Pen Needle 32G X 4 MM MISC Use as directed 100 each 11   ipratropium-albuterol (DUONEB) 0.5-2.5 (3) MG/3ML SOLN Take 3 mLs by nebulization every 4 (four) hours as needed. 180 mL 1   Lancet Device MISC 1 each by  Does not apply route in the morning, at noon, and at bedtime. May substitute to any manufacturer covered by patient's insurance. 1 each 0   Lancets Misc. MISC 1 each by Does not apply route in the morning, at noon, and at bedtime. May substitute to any manufacturer covered by patient's insurance. 100 each 0   losartan (COZAAR) 100 MG tablet TAKE ONE TABLET BY MOUTH EVERY DAY 90 tablet 4   metFORMIN (GLUCOPHAGE) 500 MG tablet Take 1 tablet (500 mg total) by mouth 2 (two) times daily with a meal. 180 tablet 1   montelukast (SINGULAIR) 10 MG tablet Take 1 tablet (10 mg total) by mouth at bedtime. 30 tablet 3   pantoprazole (PROTONIX) 40 MG tablet Take 1 tablet (40 mg total) by mouth daily. Take 30-60 min before first meal of the day 30 tablet 2   potassium chloride SA (KLOR-CON M) 20 MEQ tablet TAKE ONE TABLET BY MOUTH 2 TIMES A DAY 180 tablet 4   Rightest GL300 Lancets MISC USE AS DIRECTED. 100 each 0   Semaglutide, 2 MG/DOSE, (OZEMPIC, 2 MG/DOSE,) 8 MG/3ML SOPN Inject 2mg  into the skin once a week 12 mL 3   torsemide (DEMADEX) 20 MG tablet Take 2 tablets (40 mg total) by mouth once daily. May take additional 1 tablet (20mg  total) in the evening if needed. 270 tablet 3   Vitamin D, Ergocalciferol, (DRISDOL) 1.25 MG (50000 UNIT) CAPS capsule Take 1 capsule (50,000 Units total) by mouth every 7 (seven) days. 12 capsule 0   No current facility-administered medications for this visit.   Vitals:   07/30/22 0838  BP: 117/68  Pulse: (!) 108  SpO2: 97%  Weight: 283 lb 3.2 oz (128.5 kg)   Wt Readings from Last 3 Encounters:  07/30/22 283 lb 3.2  oz (128.5 kg)  05/10/22 273 lb (123.8 kg)  04/30/22 276 lb 4 oz (125.3 kg)   Lab Results  Component Value Date   CREATININE 1.68 (H) 04/18/2022   CREATININE 1.65 (H) 11/14/2021   CREATININE 1.47 (H) 08/08/2021   PHYSICAL EXAM:  General:  Well appearing. No resp difficulty HEENT: normal Neck: supple. JVP flat. Carotids 2+ bilaterally; no bruits. No  lymphadenopathy or thryomegaly appreciated. Cor: PMI normal. Regular rhythm, tachycardic. No rubs, gallops or murmurs. Lungs: expiratory wheezing throughout Abdomen: soft, nontender, nondistended. No hepatosplenomegaly. No bruits or masses. Good bowel sounds. Extremities: no cyanosis, clubbing, rash, edema Neuro: alert & oriented x3, cranial nerves grossly intact. Moves all 4 extremities w/o difficulty. Affect pleasant.   ECG: ST with HR 108   ASSESSMENT & PLAN:  1: NICM with preserved ejection fraction-  - likely due to HTN and OSA - NYHA Class III - euvolemic today - weighing daily; reminded to call for an overnight weight gain of >2 pounds or a weekly weight gain of >5 pounds - weight up 7 pounds from last visit here 3 months ago - Echo 09/02/15: EF of 30-35%, mild mitral regurgitation and no aortic stenosis, EF has declined from 40-45% in March 2017 but he had been inconsistent taking his medications & not using his CPAP during this time. - Echo 10/28/2018: EF of 60-65% along with mildly elevated PA pressure.  - have not heard about previous echo PA being approved so we will wait till he gets his new insurance card - trying to eat low sodium foods when he can but says it's difficult at times due to finances - stop carvedilol & begin bystolic 5mg  daily as bystolic is beta-1 selective and see if wheezing/ SOB improves; if he can't afford bystolic, can try bisoprolol 5mg  daily - continue losartan 100mg  daily; consider changing this to entresto once he gets his insurance settled from his disablity - continue farxiga 10mg  daily - continue torsemide 40mg  every other day alternating with 20mg  every other day - continue potassium BID - encouraged him to walk up / down the 3 flights of stairs as often as he can and that it should get easier over time - BNP 09/01/15 was 1546.0   2: HTN- - BP 117/68 - saw PCP (Ostwalt) 05/24 - continue hydralazine 25mg  BID - BMP on 04/18/22 reviewed and  shows sodium 140, potassium 4.0, creatinine 1.68 and GFR 48  3: Diabetes- - A1c on 04/18/22 was 7.4% - continue metformin 500mg  BID - continue ozempic 2mg  weekly - home glucose running 160's-180's, occasionally 130's  4: Asthma- - has albuterol rescue inhaler - changing beta blocker to a beta-1 selective one to see if wheezing improves  5: Sleep apnea-  - wearing CPAP nightly although he says that he doesn't feel like the mask fits correctly anymore - last sleep study was done 10/16 - once he gets his new insurance information, will see about getting sleep study repeated - saw pulmonology Sherene Sires) 04/24  Return in 1 month, sooner if needed

## 2022-08-24 NOTE — Progress Notes (Signed)
Established patient visit  Patient: Bradley Mckenzie.   DOB: Jun 25, 1965   56 y.o. Male  MRN: [id number] Visit Date: 08/30/2022  Today's healthcare provider: [name], PA-C   Chief Complaint  Patient presents with   Medical Management of Chronic Issues    Patient reports good compliance and tolerance to medications. He reports fbs was 200 this morning. He reports his readings have been in the 200s. Patient requesting handicap forms to be filled out for [facility].   Subjective    HPI   Discussed the use of AI scribe software for clinical note transcription with the patient, who gave verbal consent to proceed.  History of Present Illness   The patient, with a history of diabetes, heart failure, and COPD, presents with concerns about high blood sugar readings on his new glucometer. Despite being on Lantus, Ozempic, and Metformin, the patient's blood sugar was 200 this morning. The patient reports no changes in diet or exercise habits, and is unsure why his blood sugar readings have been higher than normal.  In addition to his concerns about his blood sugar, the patient also reports having issues with breathing and coughing. He attributes these symptoms to his COPD and allergies. The patient mentions that his symptoms worsen when his allergies are acting up, leading to increased difficulty in breathing and more frequent coughing. The patient also mentions that he sometimes experiences disorientation due to his breathing issues. He has not smoked for many years.   The patient also reports having back problems, which limit his ability to exercise.   The patient also mentions that he has not had an eye exam in about two years, and that he needs to schedule one.           08/30/2022    3:38 PM 05/24/2022    2:58 PM 04/12/2022    3:50 PM  Depression screen PHQ 2/9  Decreased Interest 1 1 1   Down, Depressed, Hopeless 1 1 0  PHQ - 2 Score 2 2 1   Altered sleeping 2 2 2   Tired, decreased  energy 2 2 2   Change in appetite 2 1   Feeling bad or failure about yourself  1 1 1   Trouble concentrating 1 1 1   Moving slowly or fidgety/restless 0 0 0  Suicidal thoughts 0 0 0  PHQ-9 Score 10 9 7   Difficult doing work/chores Not difficult at all  Extremely dIfficult      08/30/2022    3:38 PM 08/14/2021    8:38 AM 11/09/2020    1:02 PM 08/16/2019   10:37 AM  GAD 7 : Generalized Anxiety Score  Nervous, Anxious, on Edge 1 0 0 0  Control/stop worrying 1 0 0 0  Worry too much - different things 2 0 0 0  Trouble relaxing 2 0 0 0  Restless 0 0 0 0  Easily annoyed or irritable 1 0 0 0  Afraid - awful might happen 0 0 0 0  Total GAD 7 Score 7 0 0 0  Anxiety Difficulty Not difficult at all Not difficult at all Not difficult at all Not difficult at all    Medications: Outpatient Medications Prior to Visit  Medication Sig   Accu-Chek Softclix Lancets lancets Use to check blood sugar three times daily   albuterol (PROAIR HFA) 108 (90 Base) MCG/ACT inhaler Inhale 2 puffs into the lungs every 6 (six) hours as needed for wheezing or shortness of breath.   atorvastatin (LIPITOR) 10 MG tablet TAKE ONE  TABLET BY MOUTH EVERY DAY   Blood Glucose Monitoring Suppl (ACCU-CHEK GUIDE) w/Device KIT 1 each by Does not apply route 3 (three) times daily.   Blood Glucose Monitoring Suppl DEVI 1 each by Does not apply route in the morning, at noon, and at bedtime. May substitute to any manufacturer covered by patient's insurance.   Blood Pressure Monitoring (BLOOD PRESSURE CUFF) MISC USE AS DIRECTED ONCE DAILY AT 12 NOON.   budesonide-formoterol (SYMBICORT) 80-4.5 MCG/ACT inhaler Inhale 2 puffs into the lungs every morning AND 2 puffs every evening (about 12 hours later)   cetirizine (ZYRTEC) 10 MG tablet Take 1 tablet (10 mg total) by mouth daily.   dapagliflozin propanediol (FARXIGA) 10 MG TABS tablet Take 10 mg by mouth daily.   glucose blood (ACCU-CHEK GUIDE) test strip Use as instructed   hydrALAZINE  (APRESOLINE) 25 MG tablet Take 1 tablet (25 mg total) by mouth 2 (two) times daily.   insulin glargine (LANTUS) 100 UNIT/ML Solostar Pen Inject 31 Units into the skin daily.   Insulin Pen Needle 30G X 5 MM MISC 1 each by Does not apply route daily.   Insulin Pen Needle 32G X 4 MM MISC Use as directed   ipratropium-albuterol (DUONEB) 0.5-2.5 (3) MG/3ML SOLN Take 3 mLs by nebulization every 4 (four) hours as needed.   losartan (COZAAR) 100 MG tablet TAKE ONE TABLET BY MOUTH EVERY DAY   metFORMIN (GLUCOPHAGE) 500 MG tablet Take 1 tablet (500 mg total) by mouth 2 (two) times daily with a meal.   montelukast (SINGULAIR) 10 MG tablet Take 1 tablet (10 mg total) by mouth at bedtime.   nebivolol (BYSTOLIC) 5 MG tablet Take 1 tablet (5 mg total) by mouth daily.   potassium chloride SA (KLOR-CON M) 20 MEQ tablet TAKE ONE TABLET BY MOUTH 2 TIMES A DAY   Rightest GL300 Lancets MISC USE AS DIRECTED.   Semaglutide, 2 MG/DOSE, (OZEMPIC, 2 MG/DOSE,) 8 MG/3ML SOPN Inject 2mg  into the skin once a week   torsemide (DEMADEX) 20 MG tablet Take 2 tablets (40 mg total) by mouth once daily. May take additional 1 tablet (20mg  total) in the evening if needed.   [DISCONTINUED] gabapentin (NEURONTIN) 300 MG capsule Take 1 capsule (300 mg total) by mouth daily. (Patient not taking: Reported on 05/24/2022)   [DISCONTINUED] insulin glargine (LANTUS) 100 UNIT/ML Solostar Pen Inject 31 Units into the skin once daily. (Patient not taking: Reported on 07/30/2022)   [DISCONTINUED] Vitamin D, Ergocalciferol, (DRISDOL) 1.25 MG (50000 UNIT) CAPS capsule Take 1 capsule (50,000 Units total) by mouth every 7 (seven) days. (Patient not taking: Reported on 07/30/2022)   No facility-administered medications prior to visit.    Review of Systems  All other systems reviewed and are negative.  Except see HPI       Objective    BP 128/79 (BP Location: Left Arm, Patient Position: Sitting, Cuff Size: Large)   Pulse 99   Temp 98.2 F (36.8  C) (Temporal)   Resp 20   Ht 5\' 9"  (1.753 m)   Wt 281 lb 4.8 oz (127.6 kg)   SpO2 94%   BMI 41.54 kg/m     Physical Exam Vitals reviewed.  Constitutional:      General: He is not in acute distress.    Appearance: Normal appearance. He is not diaphoretic.  HENT:     Head: Normocephalic and atraumatic.  Eyes:     General: No scleral icterus.    Conjunctiva/sclera: Conjunctivae normal.  Cardiovascular:  Rate and Rhythm: Normal rate and regular rhythm.     Pulses: Normal pulses.     Heart sounds: Normal heart sounds. No murmur heard. Pulmonary:     Effort: Pulmonary effort is normal. No respiratory distress.     Breath sounds: Rhonchi present.  Musculoskeletal:     Cervical back: Neck supple.     Right lower leg: No edema.     Left lower leg: No edema.  Lymphadenopathy:     Cervical: No cervical adenopathy.  Skin:    General: Skin is warm and dry.     Findings: No rash.  Neurological:     Mental Status: He is alert and oriented to person, place, and time. Mental status is at baseline.  Psychiatric:        Mood and Affect: Mood normal.        Behavior: Behavior normal.      Results for orders placed or performed in visit on 08/30/22  POCT glycosylated hemoglobin (Hb A1C)  Result Value Ref Range   Hemoglobin A1C 7.8 (A) 4.0 - 5.6 %    Assessment & Plan    Essential hypertension Chronic and stable BP WNL today Continue low salt diet and regular exercise. Continue current HTN regimen: losartan 100mg , torsemide 20mg , hydralazine 25mg ? Will fu  Type 2 diabetes mellitus with complication (HCC) Chronic and unstable Elevated morning glucose (200 mg/dL) despite current regimen of Lantus 31 units daily, Ozempic 2 mcg weekly, and Metformin 500 mg twice daily. Pt declined increase dose of Lantus vs Metformin. Patient recently switched glucometers and has been having difficulty obtaining test strips. -Encourage patient to contact insurance to inquire about coverage for  a continuous glucose monitor (Libra). -Refer to nutritionist for dietary counseling. -Check labs today. -Schedule follow-up appointment in two weeks. - POCT glycosylated hemoglobin (Hb A1C) 7.8 - Lipid panel - Comprehensive metabolic panel Will reassess after  receiving lab results  Mixed hyperlipidemia Chronic and stable? Continue diet and lipitor 10mg   - Lipid panel - Comprehensive metabolic panel Will reassess  Moderate asthma with exacerbation, unspecified whether persistent SOB (shortness of breath) Chronic Chronic Obstructive Pulmonary Disease (COPD)? Patient reports cough and occasional shortness of breath.  Ox saturation 94, mild abnormal lung sounds over the entire lung fields. Last pulmonary consultation was in April of this year, but follow-up was not pursued due to insurance coverage issues. Quit smoking in 20's -Refer to pulmonology for evaluation and manage ment. Continue albuterol and Symbicort, montelukast. -Order chest x-ray today to rule out bronchitis vs pneumonia. -Advise patient to schedule follow-up appointment in two weeks. - DG Chest 2 View; Future - Ambulatory referral to Pulmonology  General Health Maintenance -Advise patient to schedule an eye exam, as it has been two years since the last one. -Complete and submit forms for handicap plate and placard.  Return in about 2 weeks (around 09/13/2022) for DMII and SOB.     The patient was advised to call back or seek an in-person evaluation if the symptoms worsen or if the condition fails to improve as anticipated.  I discussed the assessment and treatment plan with the patient. The patient was provided an opportunity to ask questions and all were answered. The patient agreed with the plan and demonstrated an understanding of the instructions.  I, [name], PA-C have reviewed all documentation for this visit. The documentation on  08/30/22 for the exam, diagnosis, procedures, and orders are all  accurate and complete.  [name], PAC, MMS [facility] [phone] (phone) [phone] (fax)  [facility]

## 2022-08-30 ENCOUNTER — Ambulatory Visit (INDEPENDENT_AMBULATORY_CARE_PROVIDER_SITE_OTHER): Payer: Medicare Other | Admitting: Physician Assistant

## 2022-08-30 ENCOUNTER — Other Ambulatory Visit: Payer: Self-pay | Admitting: Gerontology

## 2022-08-30 ENCOUNTER — Encounter: Payer: Self-pay | Admitting: Physician Assistant

## 2022-08-30 ENCOUNTER — Ambulatory Visit
Admission: RE | Admit: 2022-08-30 | Discharge: 2022-08-30 | Disposition: A | Discharge status: Home / Self Care | Payer: PRIVATE HEALTH INSURANCE | Attending: Physician Assistant | Admitting: Physician Assistant

## 2022-08-30 ENCOUNTER — Ambulatory Visit
Admission: RE | Admit: 2022-08-30 | Discharge: 2022-08-30 | Disposition: A | Discharge status: Home / Self Care | Payer: PRIVATE HEALTH INSURANCE | Source: Ambulatory Visit | Attending: Physician Assistant | Admitting: Physician Assistant

## 2022-08-30 VITALS — BP 128/79 | HR 99 | Temp 98.2°F | Resp 20 | Ht 69.0 in | Wt 281.3 lb

## 2022-08-30 DIAGNOSIS — J45901 Unspecified asthma with (acute) exacerbation: Secondary | ICD-10-CM | POA: Insufficient documentation

## 2022-08-30 DIAGNOSIS — I10 Essential (primary) hypertension: Secondary | ICD-10-CM | POA: Diagnosis not present

## 2022-08-30 DIAGNOSIS — E782 Mixed hyperlipidemia: Secondary | ICD-10-CM

## 2022-08-30 DIAGNOSIS — R0602 Shortness of breath: Secondary | ICD-10-CM

## 2022-08-30 DIAGNOSIS — E118 Type 2 diabetes mellitus with unspecified complications: Secondary | ICD-10-CM | POA: Diagnosis not present

## 2022-08-30 DIAGNOSIS — J449 Chronic obstructive pulmonary disease, unspecified: Secondary | ICD-10-CM

## 2022-08-30 DIAGNOSIS — Z794 Long term (current) use of insulin: Secondary | ICD-10-CM

## 2022-08-30 DIAGNOSIS — G4733 Obstructive sleep apnea (adult) (pediatric): Secondary | ICD-10-CM

## 2022-08-30 LAB — POCT GLYCOSYLATED HEMOGLOBIN (HGB A1C): Hemoglobin A1C: 7.8 % — AB (ref 4.0–5.6)

## 2022-08-31 LAB — COMPREHENSIVE METABOLIC PANEL
ALT: 33 IU/L (ref 0–44)
AST: 20 IU/L (ref 0–40)
Albumin: 4.6 g/dL (ref 3.8–4.9)
Alkaline Phosphatase: 116 IU/L (ref 44–121)
BUN/Creatinine Ratio: 7 — ABNORMAL LOW (ref 9–20)
BUN: 11 mg/dL (ref 6–24)
Bilirubin Total: 0.2 mg/dL (ref 0.0–1.2)
CO2: 25 mmol/L (ref 20–29)
Calcium: 9.8 mg/dL (ref 8.7–10.2)
Chloride: 103 mmol/L (ref 96–106)
Creatinine, Ser: 1.47 mg/dL — ABNORMAL HIGH (ref 0.76–1.27)
Globulin, Total: 3.4 g/dL (ref 1.5–4.5)
Glucose: 102 mg/dL — ABNORMAL HIGH (ref 70–99)
Potassium: 4.2 mmol/L (ref 3.5–5.2)
Sodium: 143 mmol/L (ref 134–144)
Total Protein: 8 g/dL (ref 6.0–8.5)
eGFR: 56 mL/min/{1.73_m2} — ABNORMAL LOW (ref >59)

## 2022-08-31 LAB — LIPID PANEL
Chol/HDL Ratio: 3.2 ratio (ref 0.0–5.0)
Cholesterol, Total: 180 mg/dL (ref 100–199)
HDL: 56 mg/dL (ref >39)
LDL Chol Calc (NIH): 106 mg/dL — ABNORMAL HIGH (ref 0–99)
Triglycerides: 97 mg/dL (ref 0–149)
VLDL Cholesterol Cal: 18 mg/dL (ref 5–40)

## 2022-09-02 NOTE — Progress Notes (Unsigned)
PCP: [name], PA (last seen 06/24) Primary Cardiologist: none  HPI:  Bradley Mckenzie is a 57 y/o male who has a history of diabetes, HTN, obstructive sleep apnea, depression, hyperlipidemia, morbid obesity and heart failure with a reduced ejection fraction. Remote tobacco exposure.    Was in the ED 12/09/21 due to left foot numbness. Was in the ED 12/18/21 due to LBP radiating down left leg.  Echo 09/02/15: EF of 30-35%, mild mitral regurgitation and no aortic stenosis, EF has declined from 40-45% in March 2017 but he had been inconsistent taking his medications & not using his CPAP during this time. Echo 10/28/2018: EF of 60-65% along with mildly elevated PA pressure.   Returns today for a HF follow-up visit with a chief complaint of moderate SOB with minimal exertion. Chronic in nature. Has associated wheezing, productive cough, intermittent chest pain/ palpitations, occasional dizziness especially with standing too quickly, pedal edema and difficulty sleeping due to difficulty with CPAP mask.   Currently wearing CPAP that was a refurbished one from at least 2018. Has an intermittent sore throat and ear pain.  Still living on the 3rd floor so has a lot of steps to walk up. Occasionally uses a treadmill.   At last visit, beta blocker was changed to bystolic to see if SOB/ wheezing improves. Patient doesn't feel like the change has made any difference in his wheezing.   ROS: All systems negative except as listed in HPI, PMH and Problem List.  SH:  Social History   Socioeconomic History   Marital status: Married    Spouse name: Not on file   Number of children: 2   Years of education: 12   Highest education level: GED or equivalent  Occupational History   Occupation: unemployed    Comment: hard time getting disability, denied medicaid   Tobacco Use   Smoking status: Never   Smokeless tobacco: Never   Tobacco comments:    Quit in his 10's.  Vaping Use   Vaping status: Never Used   Substance and Sexual Activity   Alcohol use: Not Currently    Comment: rarely alcohol use at special events   Drug use: Not Currently    Comment: Used marijuana in his teens.   Sexual activity: Not Currently  Other Topics Concern   Not on file  Social History Narrative   Lives in [place] with his wife. Wife no longer working; now on disability. Have stable housing at group home that his wife used to work at. Sometimes struggles with transportation but not desperate. Wife has working car, his doesn't work. Had food stamps but cut off after 3 months. Wasn't eligible after that bc not able to work. Has been denied twice from disability. He was not able to obtain medicaid   Social Determinants of Health   Financial Resource Strain: Low Risk  (11/28/2021)   Overall Financial Resource Strain (CARDIA)    Difficulty of Paying Living Expenses: Not hard at all  Food Insecurity: No Food Insecurity (05/17/2021)   Hunger Vital Sign    Worried About Running Out of Food in the Last Year: Never true    Ran Out of Food in the Last Year: Never true  Transportation Needs: No Transportation Needs (05/17/2021)   PRAPARE - Administrator, Civil Service (Medical): No    Lack of Transportation (Non-Medical): No  Physical Activity: Inactive (11/28/2021)   Exercise Vital Sign    Days of Exercise per Week: 0 days  Minutes of Exercise per Session: 0 min  Stress: No Stress Concern Present (11/28/2021)   [employer] of Occupational Health - Occupational Stress Questionnaire    Feeling of Stress : Only a little  Social Connections: Moderately Integrated (11/28/2021)   Social Connection and Isolation Panel [NHANES]    Frequency of Communication with Friends and Family: More than three times a week    Frequency of Social Gatherings with Friends and Family: Once a week    Attends Religious Services: 1 to 4 times per year    Active Member of [employer] or Organizations: No    Attends Tax inspector Meetings: Never    Marital Status: Married  Catering manager Violence: Not At Risk (11/28/2021)   Humiliation, Afraid, Rape, and Kick questionnaire    Fear of Current or Ex-Partner: No    Emotionally Abused: No    Physically Abused: No    Sexually Abused: No    FH:  Family History  Problem Relation Age of Onset   Heart disease Mother        alive & well.   Heart attack Father 76       died @ 44 of cancer.   Diabetes Father    Cancer Father        "in his stomach"   Cancer Sister    Cancer Paternal Uncle        strong FH malignancy multiple relavies     Past Medical History:  Diagnosis Date   Allergy    Asthma    Cardiomyopathy (HCC) 2016   a. 01/2014 Echo: EF 25-30%;  b. ? ischemic vs non-ischemic.  He's never had an ischemic eval.   Chronic combined systolic (congestive) and diastolic (congestive) heart failure (HCC)    a. 01/2014 Echo: EF 25-30%. b. Echo 03/2015: Improved EF of 40-45%   COPD (chronic obstructive pulmonary disease) (HCC)    Depression    Diabetes mellitus without complication (HCC) 2018   started med therapy approx 2018   Hyperlipidemia    Hypertension    Hypertensive cardiomyopathy (HCC) 06/30/2015   Hypertensive heart disease    a. Since his 63's.   Morbid obesity (HCC)    Obstructive sleep apnea    a. Has not used CPAP since ~ 2012.   Sleep apnea     Current Outpatient Medications  Medication Sig Dispense Refill   Accu-Chek Softclix Lancets lancets Use to check blood sugar three times daily 100 each 0   albuterol (PROAIR HFA) 108 (90 Base) MCG/ACT inhaler Inhale 2 puffs into the lungs every 6 (six) hours as needed for wheezing or shortness of breath. 8.5 g 4   atorvastatin (LIPITOR) 10 MG tablet TAKE ONE TABLET BY MOUTH EVERY DAY 90 tablet 4   Blood Glucose Monitoring Suppl (ACCU-CHEK GUIDE) w/Device KIT 1 each by Does not apply route 3 (three) times daily. 1 kit 12   Blood Glucose Monitoring Suppl DEVI 1 each by Does not apply  route in the morning, at noon, and at bedtime. May substitute to any manufacturer covered by patient's insurance. 1 each 0   Blood Pressure Monitoring (BLOOD PRESSURE CUFF) MISC USE AS DIRECTED ONCE DAILY AT 12 NOON. 1 each 0   budesonide-formoterol (SYMBICORT) 80-4.5 MCG/ACT inhaler Inhale 2 puffs into the lungs every morning AND 2 puffs every evening (about 12 hours later) 10.2 g 12   cetirizine (ZYRTEC) 10 MG tablet Take 1 tablet (10 mg total) by mouth daily. 90 tablet 1  dapagliflozin propanediol (FARXIGA) 10 MG TABS tablet Take 10 mg by mouth daily.     glucose blood (ACCU-CHEK GUIDE) test strip Use as instructed 100 each 12   hydrALAZINE (APRESOLINE) 25 MG tablet Take 1 tablet (25 mg total) by mouth 2 (two) times daily. 180 tablet 1   insulin glargine (LANTUS) 100 UNIT/ML Solostar Pen Inject 31 Units into the skin daily. 30 mL 1   Insulin Pen Needle 30G X 5 MM MISC 1 each by Does not apply route daily. 100 each 6   Insulin Pen Needle 32G X 4 MM MISC Use as directed 100 each 11   ipratropium-albuterol (DUONEB) 0.5-2.5 (3) MG/3ML SOLN Take 3 mLs by nebulization every 4 (four) hours as needed. 180 mL 1   losartan (COZAAR) 100 MG tablet TAKE ONE TABLET BY MOUTH EVERY DAY 90 tablet 4   metFORMIN (GLUCOPHAGE) 500 MG tablet Take 1 tablet (500 mg total) by mouth 2 (two) times daily with a meal. 180 tablet 1   montelukast (SINGULAIR) 10 MG tablet Take 1 tablet (10 mg total) by mouth at bedtime. 30 tablet 3   nebivolol (BYSTOLIC) 5 MG tablet Take 1 tablet (5 mg total) by mouth daily. 90 tablet 3   potassium chloride SA (KLOR-CON M) 20 MEQ tablet TAKE ONE TABLET BY MOUTH 2 TIMES A DAY 180 tablet 4   Rightest GL300 Lancets MISC USE AS DIRECTED. 100 each 0   Semaglutide, 2 MG/DOSE, (OZEMPIC, 2 MG/DOSE,) 8 MG/3ML SOPN Inject 2mg  into the skin once a week 12 mL 3   torsemide (DEMADEX) 20 MG tablet Take 2 tablets (40 mg total) by mouth once daily. May take additional 1 tablet (20mg  total) in the evening if  needed. 270 tablet 3   No current facility-administered medications for this visit.   Vitals:   09/03/22 0834  BP: 133/61  Pulse: 95  SpO2: 96%  Weight: 286 lb (129.7 kg)  Height: 5\' 9"  (1.753 m)   Wt Readings from Last 3 Encounters:  09/03/22 286 lb (129.7 kg)  08/30/22 281 lb 4.8 oz (127.6 kg)  07/30/22 283 lb 3.2 oz (128.5 kg)   Lab Results  Component Value Date   CREATININE 1.47 (H) 08/30/2022   CREATININE 1.68 (H) 04/18/2022   CREATININE 1.65 (H) 11/14/2021    PHYSICAL EXAM:  General:  Well appearing. No resp difficulty HEENT: normal Neck: supple. JVP flat. No lymphadenopathy or thryomegaly appreciated. Cor: PMI normal. Regular rhythm & rate. No rubs, gallops or murmurs. Lungs: expiratory wheezing throughout Abdomen: soft, nontender, nondistended. No hepatosplenomegaly. No bruits or masses.  Extremities: no cyanosis, clubbing, rash,  trace pitting edema bilateral lower legs Neuro: alert & oriented x3 , cranial nerves grossly intact. Moves all 4 extremities w/o difficulty. Affect pleasant.   ECG: not done   ASSESSMENT & PLAN:  1: NICM with preserved ejection fraction-  - likely due to HTN and OSA - NYHA Class III - euvolemic today - weighing daily; reminded to call for an overnight weight gain of >2 pounds or a weekly weight gain of >5 pounds - weight up 3 pounds from last visit here 1 month ago - Echo 09/02/15: EF of 30-35%, mild mitral regurgitation and no aortic stenosis, EF has declined from 40-45% in March 2017 but he had been inconsistent taking his medications & not using his CPAP during this time. - Echo 10/28/2018: EF of 60-65% along with mildly elevated PA pressure.  - echo currently scheduled for 09/23/22 - trying to eat low  sodium foods when he can but says it's difficult at times due to finances - continue bystolic 5mg  daily  - continue losartan 100mg  daily; consider changing this to entresto pending echo results - continue farxiga 10mg  daily -  continue torsemide 40mg  every other day alternating with 20mg  every other day - continue potassium BID - consider adding spironolactone at next visit - encouraged him to walk up / down the 3 flights of stairs as often as he can and that it should get easier over time - BNP 09/01/15 was 1546.0   2: HTN- - BP 133/61 - saw PCP (Ostwalt) 06/24 - continue hydralazine 25mg  BID - BMP on 08/30/22 reviewed and shows sodium 143, potassium 4.2, creatinine 1.47 and GFR 56  3: Diabetes- - A1c on 08/30/22 was 7.8% - continue metformin 500mg  BID - continue ozempic 2mg  weekly - home glucose running 170-180's with occasional 200 - may benefit from endocrinology referral; he will ask PCP to make referral  4: Asthma- - has albuterol rescue inhaler, symbicort BID & PRN nebulizer - changed beta blocker to a beta-1 selective one at last visit but subjectively he doesn't think his wheezing is any better  5: Sleep apnea-  - wearing CPAP nightly although he says that he doesn't feel like the mask fits correctly anymore - last sleep study was done 10/16 - has upcoming sleep consult 08/24 - saw pulmonology Sherene Sires) 04/24; returns 09/24  Return the week after his echo, sooner if needed.

## 2022-09-03 ENCOUNTER — Ambulatory Visit: Payer: Medicare Other | Attending: Family | Admitting: Family

## 2022-09-03 ENCOUNTER — Other Ambulatory Visit: Payer: Self-pay

## 2022-09-03 ENCOUNTER — Encounter: Payer: Self-pay | Admitting: Family

## 2022-09-03 VITALS — BP 133/61 | HR 95 | Ht 69.0 in | Wt 286.0 lb

## 2022-09-03 DIAGNOSIS — G4733 Obstructive sleep apnea (adult) (pediatric): Secondary | ICD-10-CM | POA: Insufficient documentation

## 2022-09-03 DIAGNOSIS — E119 Type 2 diabetes mellitus without complications: Secondary | ICD-10-CM | POA: Insufficient documentation

## 2022-09-03 DIAGNOSIS — R0789 Other chest pain: Secondary | ICD-10-CM | POA: Diagnosis not present

## 2022-09-03 DIAGNOSIS — Z7722 Contact with and (suspected) exposure to environmental tobacco smoke (acute) (chronic): Secondary | ICD-10-CM | POA: Diagnosis not present

## 2022-09-03 DIAGNOSIS — I110 Hypertensive heart disease with heart failure: Secondary | ICD-10-CM | POA: Diagnosis not present

## 2022-09-03 DIAGNOSIS — E118 Type 2 diabetes mellitus with unspecified complications: Secondary | ICD-10-CM

## 2022-09-03 DIAGNOSIS — E785 Hyperlipidemia, unspecified: Secondary | ICD-10-CM | POA: Insufficient documentation

## 2022-09-03 DIAGNOSIS — I428 Other cardiomyopathies: Secondary | ICD-10-CM | POA: Diagnosis not present

## 2022-09-03 DIAGNOSIS — H9209 Otalgia, unspecified ear: Secondary | ICD-10-CM | POA: Insufficient documentation

## 2022-09-03 DIAGNOSIS — Z794 Long term (current) use of insulin: Secondary | ICD-10-CM | POA: Diagnosis not present

## 2022-09-03 DIAGNOSIS — I5032 Chronic diastolic (congestive) heart failure: Secondary | ICD-10-CM | POA: Diagnosis not present

## 2022-09-03 DIAGNOSIS — Z79899 Other long term (current) drug therapy: Secondary | ICD-10-CM | POA: Insufficient documentation

## 2022-09-03 DIAGNOSIS — R0602 Shortness of breath: Secondary | ICD-10-CM | POA: Diagnosis present

## 2022-09-03 DIAGNOSIS — J4489 Other specified chronic obstructive pulmonary disease: Secondary | ICD-10-CM | POA: Diagnosis not present

## 2022-09-03 DIAGNOSIS — J45909 Unspecified asthma, uncomplicated: Secondary | ICD-10-CM | POA: Diagnosis not present

## 2022-09-03 DIAGNOSIS — I10 Essential (primary) hypertension: Secondary | ICD-10-CM | POA: Diagnosis not present

## 2022-09-03 DIAGNOSIS — Z7984 Long term (current) use of oral hypoglycemic drugs: Secondary | ICD-10-CM | POA: Diagnosis not present

## 2022-09-03 DIAGNOSIS — Z7951 Long term (current) use of inhaled steroids: Secondary | ICD-10-CM | POA: Diagnosis not present

## 2022-09-03 MED ORDER — OZEMPIC (2 MG/DOSE) 8 MG/3ML ~~LOC~~ SOPN
2.0000 mg | PEN_INJECTOR | SUBCUTANEOUS | 3 refills | Status: DC
Start: 2022-09-03 — End: 2023-04-22
  Filled 2022-09-03: qty 3, 28d supply, fill #0
  Filled 2022-09-28: qty 3, 28d supply, fill #1
  Filled 2022-10-25: qty 3, 28d supply, fill #2
  Filled 2022-11-29: qty 3, 28d supply, fill #3
  Filled 2022-12-18: qty 3, 28d supply, fill #4
  Filled 2023-01-28: qty 3, 28d supply, fill #5

## 2022-09-03 NOTE — Patient Instructions (Addendum)
Your provider has requested that you have an echocardiogram. Echocardiography is a painless test that uses sound waves to create images of your heart. It provides your doctor with information about the size and shape of your heart and how well your heart's chambers and valves are working. This procedure takes approximately one hour. There are no restrictions for this procedure. Your echo is scheduled for Monday, September 9th at 9 AM.  PLEASE arrive by 8:45 AM at the [facility] Registration desk.  Please do NOT wear cologne, perfume, aftershave, or lotions (deodorant is allowed).   Take the elevator down to the LL and go to the pulmonology desk to schedule an appt

## 2022-09-05 ENCOUNTER — Encounter: Payer: Self-pay | Admitting: Physician Assistant

## 2022-09-10 ENCOUNTER — Other Ambulatory Visit: Payer: Self-pay

## 2022-09-10 ENCOUNTER — Ambulatory Visit (INDEPENDENT_AMBULATORY_CARE_PROVIDER_SITE_OTHER): Payer: Medicare Other | Admitting: Nurse Practitioner

## 2022-09-10 ENCOUNTER — Encounter: Payer: Self-pay | Admitting: Nurse Practitioner

## 2022-09-10 VITALS — BP 114/70 | HR 95 | Ht 69.0 in | Wt 283.8 lb

## 2022-09-10 DIAGNOSIS — J4530 Mild persistent asthma, uncomplicated: Secondary | ICD-10-CM | POA: Diagnosis not present

## 2022-09-10 DIAGNOSIS — G4733 Obstructive sleep apnea (adult) (pediatric): Secondary | ICD-10-CM

## 2022-09-10 DIAGNOSIS — J4541 Moderate persistent asthma with (acute) exacerbation: Secondary | ICD-10-CM

## 2022-09-10 DIAGNOSIS — I5022 Chronic systolic (congestive) heart failure: Secondary | ICD-10-CM | POA: Diagnosis not present

## 2022-09-10 LAB — POCT EXHALED NITRIC OXIDE: FeNO level (ppb): 50

## 2022-09-10 MED ORDER — BUDESONIDE-FORMOTEROL FUMARATE 160-4.5 MCG/ACT IN AERO
2.0000 | INHALATION_SPRAY | Freq: Two times a day (BID) | RESPIRATORY_TRACT | 6 refills | Status: DC
Start: 2022-09-10 — End: 2023-01-13
  Filled 2022-09-10: qty 10.2, 30d supply, fill #0

## 2022-09-10 MED ORDER — PROMETHAZINE-DM 6.25-15 MG/5ML PO SYRP
5.0000 mL | ORAL_SOLUTION | Freq: Four times a day (QID) | ORAL | 0 refills | Status: DC | PRN
Start: 2022-09-10 — End: 2022-11-08
  Filled 2022-09-10: qty 118, 6d supply, fill #0

## 2022-09-10 MED ORDER — PREDNISONE 10 MG PO TABS
ORAL_TABLET | ORAL | 0 refills | Status: AC
Start: 2022-09-10 — End: 2022-09-18
  Filled 2022-09-10: qty 20, 8d supply, fill #0

## 2022-09-10 MED ORDER — BENZONATATE 200 MG PO CAPS
200.0000 mg | ORAL_CAPSULE | Freq: Three times a day (TID) | ORAL | 1 refills | Status: DC | PRN
Start: 2022-09-10 — End: 2022-09-30
  Filled 2022-09-10: qty 30, 10d supply, fill #0

## 2022-09-10 NOTE — Patient Instructions (Addendum)
Continue to use CPAP every night, minimum of 4-6 hours a night.  Change equipment as directed. Wash your tubing with warm soap and water daily, hang to dry. Wash humidifier portion weekly. Use bottled, distilled water and change daily  Be aware of reduced alertness and do not drive or operate heavy machinery if experiencing this or drowsiness.  Exercise encouraged, as tolerated. Healthy weight management discussed.  Notify if persistent daytime sleepiness occurs even with consistent use of CPAP.  We discussed how untreated sleep apnea puts an individual at risk for cardiac arrhthymias, pulm HTN, DM, stroke and increases their risk for daytime accidents.  Orders for new CPAP auto 10-18 cmH2O, mask of choice and heated humidity   Continue Albuterol inhaler 2 puffs every 6 hours as needed for shortness of breath or wheezing. Notify if symptoms persist despite rescue inhaler/neb use.  Continue Wixela 1 puff Twice daily. Brush tongue and rinse mouth afterwards. I am going to call the pharmacy to see about increasing your dose or changing inhalers  Continue singulair 1 tab At bedtime   Prednisone taper. 4 tabs for 2 days, then 3 tabs for 2 days, 2 tabs for 2 days, then 1 tab for 2 days, then stop. Take in AM with food. Start tomorrow. Promethazine DM cough syrup 5 mL every 6 hours as needed for cough. May cause drowsiness. Do not drive after taking Benzonatate 1 capsule Three times a day for cough  Follow up in 10-12 weeks to see how new CPAP is working for you with Dr. Wynona Neat or Philis Nettle, or sooner, if needed

## 2022-09-10 NOTE — Assessment & Plan Note (Addendum)
Severe OSA with AHI 47/h; on CPAP 16 cmH2O. Excellent compliance and control on download. Machine was refurbished so name on download is different "Bradley Mckenzie". He is having trouble with daytime fatigue. He does have poor sleep quality/habits. We discussed the role of pharmacological therapy for treatment of insomnia. He would like to see how new CPAP machine works for him first. Orders placed today for auto CPAP 10-18 cmH2O, mask of choice and heated humidity. Understands proper use/care of device. Aware of safe driving practices. Sleep hygiene reviewed.  Patient Instructions  Continue to use CPAP every night, minimum of 4-6 hours a night.  Change equipment as directed. Wash your tubing with warm soap and water daily, hang to dry. Wash humidifier portion weekly. Use bottled, distilled water and change daily  Be aware of reduced alertness and do not drive or operate heavy machinery if experiencing this or drowsiness.  Exercise encouraged, as tolerated. Healthy weight management discussed.  Notify if persistent daytime sleepiness occurs even with consistent use of CPAP.  We discussed how untreated sleep apnea puts an individual at risk for cardiac arrhthymias, pulm HTN, DM, stroke and increases their risk for daytime accidents.  Orders for new CPAP auto 10-18 cmH2O, mask of choice and heated humidity   Continue Albuterol inhaler 2 puffs every 6 hours as needed for shortness of breath or wheezing. Notify if symptoms persist despite rescue inhaler/neb use.  Continue Wixela 1 puff Twice daily. Brush tongue and rinse mouth afterwards. I am going to call the pharmacy to see about increasing your dose or changing inhalers  Continue singulair 1 tab At bedtime   Prednisone taper. 4 tabs for 2 days, then 3 tabs for 2 days, 2 tabs for 2 days, then 1 tab for 2 days, then stop. Take in AM with food. Start tomorrow. Promethazine DM cough syrup 5 mL every 6 hours as needed for cough. May cause drowsiness. Do  not drive after taking Benzonatate 1 capsule Three times a day for cough  Follow up in 10-12 weeks to see how new CPAP is working for you with Dr. Wynona Neat or Philis Nettle, or sooner, if needed

## 2022-09-10 NOTE — Progress Notes (Unsigned)
@Patient  ID: Bradley Manor., male    DOB: [birth year], 57 y.o.   MRN: [id number]  Chief Complaint  Patient presents with   Consult    Needs new sleep study.  Not sure if set on right pressure.  2017 machine gave error message, no one wanted to fix, got refurbished machine.  Liked the machine better.    Referring provider: [name], PA-C  HPI: 57 year old male, former remote smoker followed for asthma. He is a patient of Dr. Thurston Hole and last seen in office 05/10/2022 for initial pulmonary consult. Past medical history significant for CHF, HTN, cardiomyopathy, OSA on CPAP, DM, CKD, obesity, insomnia, HLD.  TEST/EVENTS:  \07/15/2014 PSG: AHI 47.1/h, SpO2 low 77% 10/28/2018 echo: EF 60-65%. Trivial TR. Mildly elevated PASP 05/10/2022 FeNO 47 ppb 08/30/2022 CXR: lungs are clear   05/10/2022: OV with Dr. Sherene Sires. Maintained on Wixela and PRN SABA. Use to walk wherever he wanted but around 2017 noted worsened. Has to stop on 2nd landing to catch breath since moving into new appt. Cough tends to be worse with exertion and variably at night. FeNO 47 ppb. Change to [facility] style inhaler. Need to try off DPI. Continue singulair. May need repeat echo.  09/10/2022: Today - sleep evaluation/follow up Patient presents today to establish care for OSA/CPAP and follow up of asthma. He's been on CPAP for many years. First sleep study was in 2009; next study 2016. He got a refurbished machine a few years ago. Feels like he has not been sleeping as well with it and wakes feeling poorly rested most days. He's also waking up more at night, which he isn't sure if that's his asthma or his sleep. He feels tired most days. He denies morning headaches, drowsy driving, sleep parasomnias/paralysis.  He has trouble with falling asleep. Going to bed anywhere between 10pm and 5 am. He wakes several times a night. Usually gets up between 10-11 am. He is on disability. His CPAP is set on 16 cmH2O. Full face mask. Does not wear  oxygen.  He has a history of HTN, CHF, DM. No shx of stroke. He quit smoking in his 20's. He does not drink alcohol. No excessive caffeine intake. No sleep aids. Lives with his wife. Family history of heart disease.  Regarding his breathing, he's still having trouble with a persistent cough. It becomes paroxysmal at times. Sometimes produces a small amount of yellow phlegm but it takes him a while to get it up. He has a lot of chest tightness and wheezing. He gets out of breath just walking back to the exam room. Sometimes wakes up feeling short winded or coughing. Denies any fevers, chills, hemoptysis, leg swelling, sinus symptoms. He is on [employer]. Uses albuterol a few times a week.   FeNO 50 ppb   06/12/2022-09/09/2022: CPAP 16 cmH2O 90/90 days; 99% >4 hr; average use 8 hr 11 min Leaks 27.7 AHI 1.8  No Known Allergies   There is no immunization history on file for this patient.  Past Medical History:  Diagnosis Date   Allergy    Asthma    Cardiomyopathy (HCC) 2016   a. 01/2014 Echo: EF 25-30%;  b. ? ischemic vs non-ischemic.  He's never had an ischemic eval.   Chronic combined systolic (congestive) and diastolic (congestive) heart failure (HCC)    a. 01/2014 Echo: EF 25-30%. b. Echo 03/2015: Improved EF of 40-45%   COPD (chronic obstructive pulmonary disease) (HCC)    Depression  Diabetes mellitus without complication (HCC) 2018   started med therapy approx 2018   Hyperlipidemia    Hypertension    Hypertensive cardiomyopathy (HCC) 06/30/2015   Hypertensive heart disease    a. Since his 68's.   Morbid obesity (HCC)    Obstructive sleep apnea    a. Has not used CPAP since ~ 2012.   Sleep apnea     Tobacco History: Social History   Tobacco Use  Smoking Status Never  Smokeless Tobacco Never  Tobacco Comments   Quit in his 20's.   Counseling given: Not Answered Tobacco comments: Quit in his 31's.   Outpatient Medications Prior to Visit  Medication Sig Dispense Refill    Accu-Chek Softclix Lancets lancets Use to check blood sugar three times daily 100 each 0   albuterol (PROAIR HFA) 108 (90 Base) MCG/ACT inhaler Inhale 2 puffs into the lungs every 6 (six) hours as needed for wheezing or shortness of breath. 8.5 g 4   atorvastatin (LIPITOR) 10 MG tablet TAKE ONE TABLET BY MOUTH EVERY DAY 90 tablet 4   Blood Glucose Monitoring Suppl (ACCU-CHEK GUIDE) w/Device KIT 1 each by Does not apply route 3 (three) times daily. 1 kit 12   Blood Glucose Monitoring Suppl DEVI 1 each by Does not apply route in the morning, at noon, and at bedtime. May substitute to any manufacturer covered by patient's insurance. 1 each 0   Blood Pressure Monitoring (BLOOD PRESSURE CUFF) MISC USE AS DIRECTED ONCE DAILY AT 12 NOON. 1 each 0   cetirizine (ZYRTEC) 10 MG tablet Take 1 tablet (10 mg total) by mouth daily. 90 tablet 1   dapagliflozin propanediol (FARXIGA) 10 MG TABS tablet Take 10 mg by mouth daily.     glucose blood (ACCU-CHEK GUIDE) test strip Use as instructed 100 each 12   hydrALAZINE (APRESOLINE) 25 MG tablet Take 1 tablet (25 mg total) by mouth 2 (two) times daily. 180 tablet 1   insulin glargine (LANTUS) 100 UNIT/ML Solostar Pen Inject 31 Units into the skin daily. 30 mL 1   Insulin Pen Needle 30G X 5 MM MISC 1 each by Does not apply route daily. 100 each 6   Insulin Pen Needle 32G X 4 MM MISC Use as directed 100 each 11   ipratropium-albuterol (DUONEB) 0.5-2.5 (3) MG/3ML SOLN Take 3 mLs by nebulization every 4 (four) hours as needed. 180 mL 1   losartan (COZAAR) 100 MG tablet TAKE ONE TABLET BY MOUTH EVERY DAY 90 tablet 4   metFORMIN (GLUCOPHAGE) 500 MG tablet Take 1 tablet (500 mg total) by mouth 2 (two) times daily with a meal. 180 tablet 1   montelukast (SINGULAIR) 10 MG tablet Take 1 tablet (10 mg total) by mouth at bedtime. 30 tablet 3   nebivolol (BYSTOLIC) 5 MG tablet Take 1 tablet (5 mg total) by mouth daily. 90 tablet 3   potassium chloride SA (KLOR-CON M) 20 MEQ  tablet TAKE ONE TABLET BY MOUTH 2 TIMES A DAY 180 tablet 4   Rightest GL300 Lancets MISC USE AS DIRECTED. 100 each 0   Semaglutide, 2 MG/DOSE, (OZEMPIC, 2 MG/DOSE,) 8 MG/3ML SOPN Inject 2 mg into the skin once a week. 12 mL 3   torsemide (DEMADEX) 20 MG tablet Take 2 tablets (40 mg total) by mouth once daily. May take additional 1 tablet (20mg  total) in the evening if needed. (Patient taking differently: Take 40 mg by mouth daily. Takes 2 tablets one day and 1 tablet the next day.)  270 tablet 3   budesonide-formoterol (SYMBICORT) 80-4.5 MCG/ACT inhaler Inhale 2 puffs into the lungs every morning AND 2 puffs every evening (about 12 hours later) 10.2 g 12   No facility-administered medications prior to visit.     Review of Systems:   Constitutional: No weight loss or gain, night sweats, fevers, chills, or lassitude. +fatigue  HEENT: No headaches, difficulty swallowing, tooth/dental problems, or sore throat. No sneezing, itching, ear ache, nasal congestion, or post nasal drip CV:  No chest pain, orthopnea, PND, swelling in lower extremities, anasarca, dizziness, palpitations, syncope Resp: +shortness of breath with exertion; cough; wheezing; chest tightness. No excess mucus or change in color of mucus. No hemoptysis. No chest wall deformity GI:  No heartburn, indigestion GU: No dysuria, change in color of urine, urgency or frequency.   Skin: No rash, lesions, ulcerations MSK:  No joint pain or swelling.   Neuro: No dizziness or lightheadedness.  Psych: +stable depression, anxiety. No SI/HI. Mood stable. +sleep disturbance     Physical Exam:  BP 114/70 (BP Location: Right Arm, Patient Position: Sitting, Cuff Size: Large)   Pulse 95   Ht 5\' 9"  (1.753 m)   Wt 283 lb 12.8 oz (128.7 kg)   SpO2 95%   BMI 41.91 kg/m   GEN: Pleasant, interactive, well-kempt; obese; in no acute distress. HEENT:  Normocephalic and atraumatic. PERRLA. Sclera white. Nasal turbinates pink, moist and patent  bilaterally. No rhinorrhea present. Oropharynx pink and moist, without exudate or edema. No lesions, ulcerations, or postnasal drip. Mallampati III NECK:  Supple w/ fair ROM. No JVD present. Normal carotid impulses w/o bruits. Thyroid symmetrical with no goiter or nodules palpated. No lymphadenopathy.   CV: RRR, no m/r/g, no peripheral edema. Pulses intact, +2 bilaterally. No cyanosis, pallor or clubbing. PULMONARY:  Unlabored, regular breathing. Scattered wheezes bilaterally A&P. No accessory muscle use.  GI: BS present and normoactive. Soft, non-tender to palpation. No organomegaly or masses detected.  MSK: No erythema, warmth or tenderness. Cap refil <2 sec all extrem. No deformities or joint swelling noted.  Neuro: A/Ox3. No focal deficits noted.   Skin: Warm, no lesions or rashe Psych: Normal affect and behavior. Judgement and thought content appropriate.     Lab Results:  CBC    Component Value Date/Time   WBC 9.9 12/29/2020 1609   RBC 5.07 12/29/2020 1609   HGB 13.4 12/29/2020 1609   HGB WILL FOLLOW 11/22/2020 1148   HCT 42.4 12/29/2020 1609   HCT WILL FOLLOW 11/22/2020 1148   PLT 279 12/29/2020 1609   PLT WILL FOLLOW 11/22/2020 1148   MCV 83.6 12/29/2020 1609   MCV WILL FOLLOW 11/22/2020 1148   MCV 86 02/07/2014 0427   MCH 26.4 12/29/2020 1609   MCHC 31.6 12/29/2020 1609   RDW 16.0 (H) 12/29/2020 1609   RDW WILL FOLLOW 11/22/2020 1148   RDW 16.1 (H) 02/07/2014 0427   LYMPHSABS WILL FOLLOW 11/22/2020 1148   LYMPHSABS 1.4 02/07/2014 0427   MONOABS 0.9 06/06/2020 2250   MONOABS 0.8 02/07/2014 0427   EOSABS WILL FOLLOW 11/22/2020 1148   EOSABS 0.2 02/07/2014 0427   BASOSABS WILL FOLLOW 11/22/2020 1148   BASOSABS 0.1 02/07/2014 0427    BMET    Component Value Date/Time   NA 143 08/30/2022 1622   NA 141 02/07/2014 0427   K 4.2 08/30/2022 1622   K 3.2 (L) 02/07/2014 0427   CL 103 08/30/2022 1622   CL 103 02/07/2014 0427   CO2 25 08/30/2022 1622  CO2 31  02/07/2014 0427   GLUCOSE 102 (H) 08/30/2022 1622   GLUCOSE 191 (H) 12/29/2020 1609   GLUCOSE 115 (H) 02/07/2014 0427   BUN 11 08/30/2022 1622   BUN 14 02/07/2014 0427   CREATININE 1.47 (H) 08/30/2022 1622   CREATININE 1.24 02/07/2014 0427   CALCIUM 9.8 08/30/2022 1622   CALCIUM 8.6 02/07/2014 0427   GFRNONAA 47 (L) 12/29/2020 1609   GFRNONAA >60 02/07/2014 0427   GFRAA 57 (L) 02/24/2020 1842   GFRAA >60 02/07/2014 0427    BNP    Component Value Date/Time   BNP 1,546.0 (H) 09/01/2015 1122     Imaging:  DG Chest 2 View  Result Date: 08/30/2022 CLINICAL DATA:  Abnormal lung sounds. EXAM: CHEST - 2 VIEW COMPARISON:  Chest radiograph 06/06/2020. FINDINGS: The heart size and mediastinal contours are within normal limits. Both lungs are clear. The visualized skeletal structures are unremarkable. IMPRESSION: No active cardiopulmonary disease. Electronically Signed   By: Annia Belt M.D.   On: 08/30/2022 23:20    Administration History     None           No data to display          No results found for: "NITRICOXIDE"      Assessment & Plan:   OSA on CPAP Severe OSA with AHI 47/h; on CPAP 16 cmH2O. Excellent compliance and control on download. Machine was refurbished so name on download is different "Adriana Simas". He is having trouble with daytime fatigue. He does have poor sleep quality/habits. We discussed the role of pharmacological therapy for treatment of insomnia. He would like to see how new CPAP machine works for him first. Orders placed today for auto CPAP 10-18 cmH2O, mask of choice and heated humidity. Understands proper use/care of device. Aware of safe driving practices. Sleep hygiene reviewed.  Patient Instructions  Continue to use CPAP every night, minimum of 4-6 hours a night.  Change equipment as directed. Wash your tubing with warm soap and water daily, hang to dry. Wash humidifier portion weekly. Use bottled, distilled water and change daily  Be  aware of reduced alertness and do not drive or operate heavy machinery if experiencing this or drowsiness.  Exercise encouraged, as tolerated. Healthy weight management discussed.  Notify if persistent daytime sleepiness occurs even with consistent use of CPAP.  We discussed how untreated sleep apnea puts an individual at risk for cardiac arrhthymias, pulm HTN, DM, stroke and increases their risk for daytime accidents.  Orders for new CPAP auto 10-18 cmH2O, mask of choice and heated humidity   Continue Albuterol inhaler 2 puffs every 6 hours as needed for shortness of breath or wheezing. Notify if symptoms persist despite rescue inhaler/neb use.  Continue Wixela 1 puff Twice daily. Brush tongue and rinse mouth afterwards. I am going to call the pharmacy to see about increasing your dose or changing inhalers  Continue singulair 1 tab At bedtime   Prednisone taper. 4 tabs for 2 days, then 3 tabs for 2 days, 2 tabs for 2 days, then 1 tab for 2 days, then stop. Take in AM with food. Start tomorrow. Promethazine DM cough syrup 5 mL every 6 hours as needed for cough. May cause drowsiness. Do not drive after taking Benzonatate 1 capsule Three times a day for cough  Follow up in 10-12 weeks to see how new CPAP is working for you with Dr. Wynona Neat or Philis Nettle, or sooner, if needed    Asthmatic bronchitis  Acute exacerbation with elevated exhaled nitric oxide testing. We will treat him with depo 80 mg inj and prednisone taper. Cough control measures initiated. Will resend for Symbicort 160 mcg. Need to trial off DPI to see if this helps reduce cough. If he continues to remain poorly controlled, can add on LAMA and/or may need to consider biologic therapy in the future. Will re-evaluate response at follow up. Action plan in place.   Chronic systolic heart failure (HCC) Euvolemic on exam. Follow up with cardiology as scheduled.   I spent 45 minutes of dedicated to the care of this patient on the  date of this encounter to include pre-visit review of records, face-to-face time with the patient discussing conditions above, post visit ordering of testing, clinical documentation with the electronic health record, making appropriate referrals as documented, and communicating necessary findings to members of the patients care team.  Noemi Chapel, NP 09/10/2022  Pt aware and understands NP's role.

## 2022-09-10 NOTE — Assessment & Plan Note (Addendum)
Acute exacerbation with elevated exhaled nitric oxide testing. We will treat him with depo 80 mg inj and prednisone taper. Cough control measures initiated. Will resend for Symbicort 160 mcg. Need to trial off DPI to see if this helps reduce cough. If he continues to remain poorly controlled, can add on LAMA and/or may need to consider biologic therapy in the future. Will re-evaluate response at follow up. Action plan in place.

## 2022-09-10 NOTE — Assessment & Plan Note (Signed)
Euvolemic on exam. Follow up with cardiology as scheduled

## 2022-09-11 ENCOUNTER — Encounter: Payer: Self-pay | Admitting: Nurse Practitioner

## 2022-09-11 MED ORDER — METHYLPREDNISOLONE ACETATE 80 MG/ML IJ SUSP
80.0000 mg | Freq: Once | INTRAMUSCULAR | Status: AC
Start: 2022-09-11 — End: 2022-09-10
  Administered 2022-09-10: 80 mg via INTRAMUSCULAR

## 2022-09-13 ENCOUNTER — Ambulatory Visit: Payer: Medicare Other | Admitting: Physician Assistant

## 2022-09-13 ENCOUNTER — Encounter: Payer: Self-pay | Admitting: Physician Assistant

## 2022-09-13 ENCOUNTER — Other Ambulatory Visit: Payer: Self-pay

## 2022-09-13 VITALS — BP 159/87 | HR 87 | Temp 98.2°F | Ht 69.0 in | Wt 287.6 lb

## 2022-09-13 DIAGNOSIS — R0602 Shortness of breath: Secondary | ICD-10-CM

## 2022-09-13 DIAGNOSIS — E118 Type 2 diabetes mellitus with unspecified complications: Secondary | ICD-10-CM | POA: Diagnosis not present

## 2022-09-13 DIAGNOSIS — Z0100 Encounter for examination of eyes and vision without abnormal findings: Secondary | ICD-10-CM

## 2022-09-13 DIAGNOSIS — Z794 Long term (current) use of insulin: Secondary | ICD-10-CM

## 2022-09-13 DIAGNOSIS — I10 Essential (primary) hypertension: Secondary | ICD-10-CM

## 2022-09-13 DIAGNOSIS — E782 Mixed hyperlipidemia: Secondary | ICD-10-CM

## 2022-09-13 MED ORDER — ATORVASTATIN CALCIUM 20 MG PO TABS
20.0000 mg | ORAL_TABLET | Freq: Every day | ORAL | 3 refills | Status: DC
Start: 2022-09-13 — End: 2023-03-07
  Filled 2022-09-13: qty 90, 90d supply, fill #0
  Filled 2023-01-18: qty 90, 90d supply, fill #1

## 2022-09-13 NOTE — Progress Notes (Signed)
Established patient visit  Patient: Bradley Mckenzie.   DOB: [birth year]   56 y.o. Male  MRN: [id number] Visit Date: 09/13/2022  Today's healthcare provider: [name], PA-C   Chief Complaint  Patient presents with   Follow-up   Diabetes   Shortness of Breath   Subjective   The 10-year ASCVD risk score (Arnett DK, et al., 2019) is: 26.9%  Discussed the use of AI scribe software for clinical note transcription with the patient, who gave verbal consent to proceed.  History of Present Illness   The patient, with a history of diabetes, hypertension, and bronchitis, presents with concerns about fluctuating blood sugar levels and shortness of breath. The patient reports blood sugar levels ranging from 137 to 200. They have not been able to check their blood sugar levels with their Libra device and are currently using an Accu-Check glucometer. The patient is on insulin and reports taking 31 units of Lantus daily.  The patient also reports experiencing shortness of breath. They have been using albuterol and Symbicort daily, contrary to the usual practice of using albuterol only as needed.  The patient's blood pressure was found to be high during the visit, but they attribute this to not having taken their medication on the day of the visit due to the expectation of blood work. The patient also reports not having eaten prior to the visit.          09/13/2022   12:58 PM 08/30/2022    3:38 PM 05/24/2022    2:58 PM  Depression screen PHQ 2/9  Decreased Interest 0 1 1  Down, Depressed, Hopeless 0 1 1  PHQ - 2 Score 0 2 2  Altered sleeping 0 2 2  Tired, decreased energy 0 2 2  Change in appetite 0 2 1  Feeling bad or failure about yourself  0 1 1  Trouble concentrating 0 1 1  Moving slowly or fidgety/restless 0 0 0  Suicidal thoughts 0 0 0  PHQ-9 Score 0 10 9  Difficult doing work/chores Not difficult at all Not difficult at all       09/13/2022   12:58 PM 08/30/2022    3:38 PM  08/14/2021    8:38 AM 11/09/2020    1:02 PM  GAD 7 : Generalized Anxiety Score  Nervous, Anxious, on Edge 2 1 0 0  Control/stop worrying 2 1 0 0  Worry too much - different things 2 2 0 0  Trouble relaxing 2 2 0 0  Restless 2 0 0 0  Easily annoyed or irritable 2 1 0 0  Afraid - awful might happen 2 0 0 0  Total GAD 7 Score 14 7 0 0  Anxiety Difficulty Very difficult Not difficult at all Not difficult at all Not difficult at all    Medications: Outpatient Medications Prior to Visit  Medication Sig   Accu-Chek Softclix Lancets lancets Use to check blood sugar three times daily   albuterol (PROAIR HFA) 108 (90 Base) MCG/ACT inhaler Inhale 2 puffs into the lungs every 6 (six) hours as needed for wheezing or shortness of breath.   benzonatate (TESSALON) 200 MG capsule Take 1 capsule (200 mg total) by mouth 3 (three) times daily as needed for cough.   Blood Glucose Monitoring Suppl (ACCU-CHEK GUIDE) w/Device KIT 1 each by Does not apply route 3 (three) times daily.   Blood Glucose Monitoring Suppl DEVI 1 each by Does not apply route in the morning, at noon, and at bedtime.  May substitute to any manufacturer covered by patient's insurance.   Blood Pressure Monitoring (BLOOD PRESSURE CUFF) MISC USE AS DIRECTED ONCE DAILY AT 12 NOON.   budesonide-formoterol (SYMBICORT) 160-4.5 MCG/ACT inhaler Inhale 2 puffs into the lungs 2 (two) times daily.   cetirizine (ZYRTEC) 10 MG tablet Take 1 tablet (10 mg total) by mouth daily.   dapagliflozin propanediol (FARXIGA) 10 MG TABS tablet Take 10 mg by mouth daily.   glucose blood (ACCU-CHEK GUIDE) test strip Use as instructed   hydrALAZINE (APRESOLINE) 25 MG tablet Take 1 tablet (25 mg total) by mouth 2 (two) times daily.   insulin glargine (LANTUS) 100 UNIT/ML Solostar Pen Inject 31 Units into the skin daily.   Insulin Pen Needle 30G X 5 MM MISC 1 each by Does not apply route daily.   Insulin Pen Needle 32G X 4 MM MISC Use as directed    ipratropium-albuterol (DUONEB) 0.5-2.5 (3) MG/3ML SOLN Take 3 mLs by nebulization every 4 (four) hours as needed.   losartan (COZAAR) 100 MG tablet TAKE ONE TABLET BY MOUTH EVERY DAY   metFORMIN (GLUCOPHAGE) 500 MG tablet Take 1 tablet (500 mg total) by mouth 2 (two) times daily with a meal.   montelukast (SINGULAIR) 10 MG tablet Take 1 tablet (10 mg total) by mouth at bedtime.   nebivolol (BYSTOLIC) 5 MG tablet Take 1 tablet (5 mg total) by mouth daily.   potassium chloride SA (KLOR-CON M) 20 MEQ tablet TAKE ONE TABLET BY MOUTH 2 TIMES A DAY   predniSONE (DELTASONE) 10 MG tablet Take 4 tablets (40 mg total) by mouth daily for 2 days, THEN 3 tablets (30 mg total) daily for 2 days, THEN 2 tablets (20 mg total) daily for 2 days, THEN 1 tablet (10 mg total) daily for 2 days.   promethazine-dextromethorphan (PROMETHAZINE-DM) 6.25-15 MG/5ML syrup Take 5 mLs by mouth 4 (four) times daily as needed for cough.   Rightest GL300 Lancets MISC USE AS DIRECTED.   Semaglutide, 2 MG/DOSE, (OZEMPIC, 2 MG/DOSE,) 8 MG/3ML SOPN Inject 2 mg into the skin once a week.   torsemide (DEMADEX) 20 MG tablet Take 2 tablets (40 mg total) by mouth once daily. May take additional 1 tablet (20mg  total) in the evening if needed. (Patient taking differently: Take 40 mg by mouth daily. Takes 2 tablets one day and 1 tablet the next day.)   [DISCONTINUED] atorvastatin (LIPITOR) 10 MG tablet TAKE ONE TABLET BY MOUTH EVERY DAY   No facility-administered medications prior to visit.    Review of Systems  All other systems reviewed and are negative.  Except see HPI       Objective    BP (!) 159/87   Pulse 87   Temp 98.2 F (36.8 C) (Oral)   Ht 5\' 9"  (1.753 m)   Wt 287 lb 9.6 oz (130.5 kg)   SpO2 96%   BMI 42.47 kg/m     Physical Exam Vitals reviewed.  Constitutional:      General: He is not in acute distress.    Appearance: Normal appearance. He is not diaphoretic.  HENT:     Head: Normocephalic and atraumatic.   Eyes:     General: No scleral icterus.    Conjunctiva/sclera: Conjunctivae normal.  Cardiovascular:     Rate and Rhythm: Normal rate and regular rhythm.     Pulses: Normal pulses.     Heart sounds: Normal heart sounds. No murmur heard. Pulmonary:     Effort: Pulmonary effort is normal. No  respiratory distress.     Breath sounds: Normal breath sounds. No wheezing or rhonchi.  Musculoskeletal:     Cervical back: Neck supple.     Right lower leg: No edema.     Left lower leg: No edema.  Lymphadenopathy:     Cervical: No cervical adenopathy.  Skin:    General: Skin is warm and dry.     Findings: No rash.  Neurological:     Mental Status: He is alert and oriented to person, place, and time. Mental status is at baseline.  Psychiatric:        Mood and Affect: Mood normal.        Behavior: Behavior normal.     No results found for any visits on 09/13/22.  Assessment & Plan        Diabetes Mellitus Chronic and unstable Blood glucose levels fluctuating with recent readings of 137 and 150. Currently on Lantus insulin 31 units daily. Discussed the use of a new glucometer (dexcom/sample provided) for better monitoring. -Trial of dexcom glucometer for one week. -Continue Lantus insulin 31 units daily. -Referral to a nutritionist for diabetes management.  Hypertension Chronic and unstable Elevated blood pressure noted during the visit, possibly due to not taking medication prior to the appointment. -Continue current antihypertensive medication regimen.losartan 100mg , torsemide 20mg , hydralazine 25mg ?  -Advise to take medication as prescribed, even on the day of appointments.  Hyperlipidemia Chronic and unstable LDL cholesterol level elevated, not adequately controlled on current dose of Atorvastatin 10mg  daily. -Increase Atorvastatin to 20mg  daily. -Recheck lipid panel in six weeks.  SOB 2/2 Chronic Bronchitis Currently on Albuterol and Symbicort. Discussed the possibility of  adding a triple therapy inhaler. Pt declined -Continue Albuterol and Symbicort as prescribed. -Follow up with pulmonologist on 09/30/2022. He preferred returning to his previous pulmonologist for evaluation and management and continue with pulmonologist at [facility] for OSA/CPAP. Per chart review, pt was seen by [facility] on 09/10/22  Sleep Apnea Confirmed with the recent sleep studies Currently using a refurbished CPAP machine, awaiting a new machine. -Continue using current CPAP machine until the new one arrives.  General Health Maintenance -Referral to ophthalmology for routine eye exam. -Referral to weight loss program.      Return in about 6 weeks (around 10/25/2022) for chronic disease f/u.     The patient was advised to call back or seek an in-person evaluation if the symptoms worsen or if the condition fails to improve as anticipated.  I discussed the assessment and treatment plan with the patient. The patient was provided an opportunity to ask questions and all were answered. The patient agreed with the plan and demonstrated an understanding of the instructions.  I, [name], PA-C have reviewed all documentation for this visit. The documentation on  09/13/22 for the exam, diagnosis, procedures, and orders are all accurate and complete.  [name], [facility], MMS [facility] [phone] (phone) [phone] (fax)  [facility]

## 2022-09-15 MED FILL — Losartan Potassium Tab 100 MG: ORAL | 90 days supply | Qty: 90 | Fill #2 | Status: AC

## 2022-09-15 MED FILL — Potassium Chloride Microencapsulated Crys ER Tab 20 mEq: ORAL | 90 days supply | Qty: 180 | Fill #2 | Status: AC

## 2022-09-23 ENCOUNTER — Ambulatory Visit
Admission: RE | Admit: 2022-09-23 | Discharge: 2022-09-23 | Disposition: A | Discharge status: Home / Self Care | Payer: Medicare Other | Source: Ambulatory Visit | Attending: Family

## 2022-09-23 DIAGNOSIS — I5032 Chronic diastolic (congestive) heart failure: Secondary | ICD-10-CM | POA: Diagnosis present

## 2022-09-23 DIAGNOSIS — I110 Hypertensive heart disease with heart failure: Secondary | ICD-10-CM | POA: Diagnosis not present

## 2022-09-23 DIAGNOSIS — J449 Chronic obstructive pulmonary disease, unspecified: Secondary | ICD-10-CM | POA: Insufficient documentation

## 2022-09-23 DIAGNOSIS — E119 Type 2 diabetes mellitus without complications: Secondary | ICD-10-CM | POA: Insufficient documentation

## 2022-09-23 LAB — ECHOCARDIOGRAM COMPLETE
AR max vel: 3.12 cm2
AV Area VTI: 4.21 cm2
AV Area mean vel: 2.94 cm2
AV Mean grad: 2 mmHg
AV Peak grad: 4 mmHg
Ao pk vel: 1 m/s
Area-P 1/2: 3.12 cm2
MV VTI: 3.7 cm2
S' Lateral: 3.2 cm

## 2022-09-23 NOTE — Progress Notes (Signed)
*  PRELIMINARY RESULTS* Echocardiogram 2D Echocardiogram has been performed.  Cristela Blue 09/23/2022, 9:45 AM

## 2022-09-27 ENCOUNTER — Telehealth: Payer: Self-pay | Admitting: Orthopedic Surgery

## 2022-09-27 ENCOUNTER — Other Ambulatory Visit: Payer: Self-pay | Admitting: Physician Assistant

## 2022-09-27 ENCOUNTER — Other Ambulatory Visit: Payer: Self-pay

## 2022-09-27 MED ORDER — ACCU-CHEK SOFTCLIX LANCETS MISC
0 refills | Status: DC
  Filled 2022-09-27: qty 100, 30d supply, fill #0
  Filled 2022-09-27: qty 100, 33d supply, fill #0

## 2022-09-27 NOTE — Telephone Encounter (Signed)
Stacy ordered MRI in 01/2022. He never had it done because he had the run around  that Medicaid was not going to pay for it from the imaging department. He now has Medicare AB and maybe medicaid. Can you start a new auth under these 2 insurance. He is still having issues with his back and foot.

## 2022-09-27 NOTE — Telephone Encounter (Signed)
I agree that he needs seen for evaluation. I don't think they will approve MRI with last office visit being in January.

## 2022-09-29 NOTE — Progress Notes (Unsigned)
Bradley Jeyren Lynds., male    DOB: April 07, 1965    MRN: [id number]   Brief patient profile:  52  yobf never really smoked regularly served in [employer] referred to pulmonary clinic in [place]  05/10/2022 by [name] PA  for sob/ cough    Baseline wt 248 around 2012  History of Present Illness  05/10/2022  Pulmonary/ 1st office eval/ Deztiny Sarra / [name] - wt 273/  on maint wixella and prn saba but very poor Radiation protection practitioner Complaint  Patient presents with   Consult    Pt consult he says that he has been experiencing SOB >30yrs was unable to narrow down a timeframe of it worsening. States he smoke around the age of 18-18   Dyspnea:  used to walk any where he wanted but around 2017 noted worsened now has go to slower than  others.  Has to stop on 2nd landing  to catch breath since moving into new appt. Cough: tends to be worse with ex and variably at hs/ non productive Sleep: flat bed x 1 pillows   7nights/ a week / places cpap always sob and sometimes coughing / has not tried recliner  SABA use: w/in on hour of ov Rec Plan A = Automatic = Always=    Symbicort 80 Take 2 puffs (or Breztri x one puff) first thing in am and then another 2 puffs about 12 hours later.  Work on inhaler technique:  Plan B = Backup (to supplement plan A, not to replace it) Only use your albuterol inhaler as a rescue medication Plan C = Crisis (instead of Plan B but only if Plan B stops working) - only use your albuterol nebulizer if you first try Plan B  Pantoprazole (protonix) 40 mg   Take  30-60 min before first meal of the day and Pepcid (famotidine)  20 mg after supper until return to office - this is the best way to tell whether stomach acid is contributing to your problem.   GERD diet reviewed, bed blocks rec   Please schedule a follow up office visit in 4 weeks, sooner if needed  with all medications /inhalers/ solutions in hand so we can verify exactly what you are taking. This includes all medications from all  doctors and over the counters     09/30/2022  f/u ov/[place] office/Nelda Luckey re: *** maint on ***  No chief complaint on file.   Dyspnea:  *** Cough: *** Sleeping: cpap per Florentina Addison  ***  resp cc  SABA use: *** 02: ***  Lung cancer screening: ***   No obvious day to day or daytime variability or assoc excess/ purulent sputum or mucus plugs or hemoptysis or cp or chest tightness, subjective wheeze or overt sinus or hb symptoms.    Also denies any obvious fluctuation of symptoms with weather or environmental changes or other aggravating or alleviating factors except as outlined above   No unusual exposure hx or h/o childhood pna/ asthma or knowledge of premature birth.  Current Allergies, Complete Past Medical History, Past Surgical History, Family History, and Social History were reviewed in [name] record.  ROS  The following are not active complaints unless bolded Hoarseness, sore throat, dysphagia, dental problems, itching, sneezing,  nasal congestion or discharge of excess mucus or purulent secretions, ear ache,   fever, chills, sweats, unintended wt loss or wt gain, classically pleuritic or exertional cp,  orthopnea pnd or arm/hand swelling  or leg swelling, presyncope, palpitations, abdominal  pain, anorexia, nausea, vomiting, diarrhea  or change in bowel habits or change in bladder habits, change in stools or change in urine, dysuria, hematuria,  rash, arthralgias, visual complaints, headache, numbness, weakness or ataxia or problems with walking or coordination,  change in mood or  memory.        No outpatient medications have been marked as taking for the 09/30/22 encounter (Appointment) with Nyoka Cowden, MD.            Past Medical History:  Diagnosis Date   Allergy    Asthma    Cardiomyopathy (HCC) 2016   a. 01/2014 Echo: EF 25-30%;  b. ? ischemic vs non-ischemic.  He's never had an ischemic eval.   Chronic combined systolic (congestive) and  diastolic (congestive) heart failure (HCC)    a. 01/2014 Echo: EF 25-30%. b. Echo 03/2015: Improved EF of 40-45%   COPD (chronic obstructive pulmonary disease) (HCC)    Depression    Diabetes mellitus without complication (HCC) 2018   started med therapy approx 2018   Hyperlipidemia    Hypertension    Hypertensive cardiomyopathy (HCC) 06/30/2015   Hypertensive heart disease    a. Since his 70's.   Morbid obesity (HCC)    Obstructive sleep apnea    a. Has not used CPAP since ~ 2012.   Sleep apnea        Objective:       Wt Readings from Last 3 Encounters:  09/13/22 287 lb 9.6 oz (130.5 kg)  09/10/22 283 lb 12.8 oz (128.7 kg)  09/03/22 286 lb (129.7 kg)      Vital signs reviewed  09/30/2022  - Note at rest 02 sats  ***% on ***   General appearance:    ***        Assessment

## 2022-09-30 ENCOUNTER — Encounter: Payer: Self-pay | Admitting: Internal Medicine

## 2022-09-30 ENCOUNTER — Ambulatory Visit (INDEPENDENT_AMBULATORY_CARE_PROVIDER_SITE_OTHER): Payer: Medicare Other | Admitting: Internal Medicine

## 2022-09-30 ENCOUNTER — Other Ambulatory Visit: Payer: Self-pay

## 2022-09-30 DIAGNOSIS — J4541 Moderate persistent asthma with (acute) exacerbation: Secondary | ICD-10-CM

## 2022-09-30 MED ORDER — PANTOPRAZOLE SODIUM 40 MG PO TBEC
40.0000 mg | DELAYED_RELEASE_TABLET | Freq: Every day | ORAL | 2 refills | Status: DC
  Filled 2022-09-30: qty 30, 30d supply, fill #0
  Filled 2022-11-29: qty 30, 30d supply, fill #1
  Filled 2023-01-18: qty 30, 30d supply, fill #2

## 2022-09-30 MED ORDER — BENZONATATE 200 MG PO CAPS
200.0000 mg | ORAL_CAPSULE | Freq: Three times a day (TID) | ORAL | 1 refills | Status: DC | PRN
  Filled 2022-09-30: qty 30, 10d supply, fill #0

## 2022-09-30 MED ORDER — FAMOTIDINE 20 MG PO TABS
20.0000 mg | ORAL_TABLET | Freq: Every day | ORAL | 11 refills | Status: DC
  Filled 2022-09-30: qty 30, 30d supply, fill #0
  Filled 2022-11-29: qty 30, 30d supply, fill #1
  Filled 2023-01-18: qty 30, 30d supply, fill #2
  Filled 2023-03-06: qty 30, 30d supply, fill #3
  Filled 2023-04-17: qty 30, 30d supply, fill #4
  Filled 2023-04-20 – 2023-05-15 (×2): qty 30, 30d supply, fill #5
  Filled 2023-06-16: qty 30, 30d supply, fill #6

## 2022-09-30 NOTE — Patient Instructions (Addendum)
Plan A = Automatic = Always=    Symbicort 160 Take 2 puffs first thing in am and then another 2 puffs about 12 hours later.   Resume Pantoprazole (protonix) 40 mg   Take  30-60 min before first meal of the day and Pepcid (famotidine)  20 mg after supper until return to office - this is the best way to tell whether stomach acid is contributing to your problem.     Plan B = Backup (to supplement plan A, not to replace it) Only use your albuterol inhaler as a rescue medication to be used if you can't catch your breath by resting or doing a relaxed purse lip breathing pattern.  - The less you use it, the better it will work when you need it. - Ok to use the inhaler up to 2 puffs  every 4 hours if you must but call for appointment if use goes up over your usual need - Don't leave home without it !!  (think of it like the spare tire for your car)   Plan C = Crisis (instead of Plan B but only if Plan B stops working) - only use your albuterol nebulizer if you first try Plan B and it fails to help > ok to use the nebulizer up to every 4 hours but if start needing it regularly call for immediate appointment  My office will be contacting you by phone for referral to PFTs 1st available   - if you don't hear back from my office within one week please call us back or notify us thru MyChart and we'll address it right away.    Please schedule a follow up office visit in 6 weeks, call sooner if needed with all medications /inhalers/ solutions in hand so we can verify exactly what you are taking. This includes all medications from all doctors and over the counters - PLEASE separate them into two bags:  the ones you take automatically, no matter what, vs the ones you take just when you feel you need them "BAG #2 is UP TO YOU"  - this will really help [place] help you take your medications more effectively.

## 2022-09-30 NOTE — Telephone Encounter (Signed)
He confirmed appt for 10/07/2022

## 2022-09-30 NOTE — Assessment & Plan Note (Signed)
Body mass index is 42.68 kg/m.  -  trending up slowly  Lab Results  Component Value Date   TSH 1.140 11/22/2020      Contributing to doe and risk of GERD/dvt/ PE  >>>   reviewed the need and the process to achieve and maintain neg calorie balance > defer f/u primary care including intermittently monitoring thyroid status     F/u in 6 weeks with all meds in hand using a trust but verify approach to confirm accurate Medication  Reconciliation The principal here is that until we are certain that the  patients are doing what we've asked, it makes no sense to ask them to do more.   Each maintenance medication was reviewed in detail including emphasizing most importantly the difference between maintenance and prns and under what circumstances the prns are to be triggered using an action plan format where appropriate.  Total time for H and P, chart review, counseling, reviewing hfa device(s) , directly observing portions of ambulatory 02 saturation study/ and generating customized AVS unique to this office visit / same day charting > 30 min for   refractory respiratory  symptoms of uncertain etiology

## 2022-09-30 NOTE — Assessment & Plan Note (Signed)
Onset around 2017  - 05/10/2022  After extensive coaching inhaler device,  effectiveness =    25% improved to 755 (Ti too short) > try symbicort 80 2bid and max gerd rx  - FENO 05/10/2022  = 47 on wixella 250  - 05/10/2022   Walked on RA  x  one   lap(s) =  approx 150  ft  @ slow pace, stopped due to tired> sob with lowest 02 sats 92%   - 09/30/2022  After extensive coaching inhaler device,  effectiveness =    80%  - 09/30/2022   Walked on RA  x  3  lap(s) =  approx 450  ft  @ nl  pace, stopped due to end of study  with lowest 02 sats 94%    Needs pfts to sort out what component if any of doe is related to airflow obst and in meantime try to perfect hfa in anticipation of reducing his to symb 80 which causes less cough

## 2022-10-01 ENCOUNTER — Encounter: Payer: Self-pay | Admitting: Family

## 2022-10-01 ENCOUNTER — Ambulatory Visit: Payer: Medicare Other | Attending: Family | Admitting: Family

## 2022-10-01 VITALS — BP 145/74 | HR 87 | Wt 292.1 lb

## 2022-10-01 DIAGNOSIS — J45909 Unspecified asthma, uncomplicated: Secondary | ICD-10-CM | POA: Insufficient documentation

## 2022-10-01 DIAGNOSIS — I428 Other cardiomyopathies: Secondary | ICD-10-CM | POA: Insufficient documentation

## 2022-10-01 DIAGNOSIS — Z794 Long term (current) use of insulin: Secondary | ICD-10-CM

## 2022-10-01 DIAGNOSIS — Z7984 Long term (current) use of oral hypoglycemic drugs: Secondary | ICD-10-CM | POA: Diagnosis not present

## 2022-10-01 DIAGNOSIS — Z6841 Body Mass Index (BMI) 40.0 and over, adult: Secondary | ICD-10-CM | POA: Diagnosis not present

## 2022-10-01 DIAGNOSIS — I10 Essential (primary) hypertension: Secondary | ICD-10-CM | POA: Diagnosis not present

## 2022-10-01 DIAGNOSIS — I110 Hypertensive heart disease with heart failure: Secondary | ICD-10-CM | POA: Insufficient documentation

## 2022-10-01 DIAGNOSIS — I5032 Chronic diastolic (congestive) heart failure: Secondary | ICD-10-CM | POA: Insufficient documentation

## 2022-10-01 DIAGNOSIS — E119 Type 2 diabetes mellitus without complications: Secondary | ICD-10-CM | POA: Insufficient documentation

## 2022-10-01 DIAGNOSIS — G4733 Obstructive sleep apnea (adult) (pediatric): Secondary | ICD-10-CM | POA: Diagnosis not present

## 2022-10-01 DIAGNOSIS — R635 Abnormal weight gain: Secondary | ICD-10-CM | POA: Insufficient documentation

## 2022-10-01 DIAGNOSIS — I5022 Chronic systolic (congestive) heart failure: Secondary | ICD-10-CM | POA: Diagnosis present

## 2022-10-01 NOTE — Patient Instructions (Addendum)
Try sucking on lollipop or chewing sugar free gum to relieve dry mouth   Get compression socks and put them on every morning with removal at bedtime   Continue current medications  Your physician recommends that you schedule a follow-up appointment in: 3 months

## 2022-10-01 NOTE — Progress Notes (Signed)
PCP: [name], PA (last seen 08/24) Primary Cardiologist: none  HPI:  Mr.Caylor is a 57 y/o male who has a history of diabetes, HTN, obstructive sleep apnea, depression, hyperlipidemia, morbid obesity and heart failure with a reduced ejection fraction. Remote tobacco exposure.    Was in the ED 12/09/21 due to left foot numbness. Was in the ED 12/18/21 due to LBP radiating down left leg.  Echo 03/27/15: EF 40-45% with mild MR Echo 09/02/15: EF of 30-35%, mild mitral regurgitation and no aortic stenosis. Echo 10/28/2018: EF of 60-65% along with mildly elevated PA pressure.  Echo 09/23/22: EF 55-60% with moderate LVH, Grade I DD  Returns today for a HF follow-up visit with a chief complaint of moderate fatigue with minimal exertion. Chronic in nature. Has associated shortness of breath, occasional palpitations, back pain, pedal edema & gradual weight gain along with this. Denies chest pain, cough, abdominal distention or dizziness.   Recently finished prednisone taper. Didn't take diuretic yesterday due to appointment and running errands & has not taken any of his medications yet today.   Not adding salt. Drinking 6-7 bottles of fluids daily & understands that he's drinking too much fluids but says that his mouth stays dry all the time.   Still living on the 3rd floor so has a lot of steps to walk up. Occasionally uses a treadmill.   He has seen pulmonology since last here and has upcoming PFT's scheduled.   ROS: All systems negative except as listed in HPI, PMH and Problem List.  SH:  Social History   Socioeconomic History   Marital status: Married    Spouse name: Not on file   Number of children: 2   Years of education: 12   Highest education level: GED or equivalent  Occupational History   Occupation: unemployed    Comment: hard time getting disability, denied medicaid   Tobacco Use   Smoking status: Never   Smokeless tobacco: Never   Tobacco comments:    Quit in his 53's.   Vaping Use   Vaping status: Never Used  Substance and Sexual Activity   Alcohol use: Not Currently    Comment: rarely alcohol use at special events   Drug use: Not Currently    Comment: Used marijuana in his teens.   Sexual activity: Not Currently  Other Topics Concern   Not on file  Social History Narrative   Lives in [place] with his wife. Wife no longer working; now on disability. Have stable housing at group home that his wife used to work at. Sometimes struggles with transportation but not desperate. Wife has working car, his doesn't work. Had food stamps but cut off after 3 months. Wasn't eligible after that bc not able to work. Has been denied twice from disability. He was not able to obtain medicaid   Social Determinants of Health   Financial Resource Strain: Low Risk  (11/28/2021)   Overall Financial Resource Strain (CARDIA)    Difficulty of Paying Living Expenses: Not hard at all  Food Insecurity: No Food Insecurity (05/17/2021)   Hunger Vital Sign    Worried About Running Out of Food in the Last Year: Never true    Ran Out of Food in the Last Year: Never true  Transportation Needs: No Transportation Needs (05/17/2021)   PRAPARE - Administrator, Civil Service (Medical): No    Lack of Transportation (Non-Medical): No  Physical Activity: Inactive (11/28/2021)   Exercise Vital Sign  Days of Exercise per Week: 0 days    Minutes of Exercise per Session: 0 min  Stress: No Stress Concern Present (11/28/2021)   [employer] of Occupational Health - Occupational Stress Questionnaire    Feeling of Stress : Only a little  Social Connections: Moderately Integrated (11/28/2021)   Social Connection and Isolation Panel [NHANES]    Frequency of Communication with Friends and Family: More than three times a week    Frequency of Social Gatherings with Friends and Family: Once a week    Attends Religious Services: 1 to 4 times per year    Active Member of [employer] or  Organizations: No    Attends Banker Meetings: Never    Marital Status: Married  Catering manager Violence: Not At Risk (11/28/2021)   Humiliation, Afraid, Rape, and Kick questionnaire    Fear of Current or Ex-Partner: No    Emotionally Abused: No    Physically Abused: No    Sexually Abused: No    FH:  Family History  Problem Relation Age of Onset   Heart disease Mother        alive & well.   Heart attack Father 50       died @ 14 of cancer.   Diabetes Father    Cancer Father        "in his stomach"   Cancer Sister    Cancer Paternal Uncle        strong FH malignancy multiple relavies     Past Medical History:  Diagnosis Date   Allergy    Asthma    Cardiomyopathy (HCC) 2016   a. 01/2014 Echo: EF 25-30%;  b. ? ischemic vs non-ischemic.  He's never had an ischemic eval.   Chronic combined systolic (congestive) and diastolic (congestive) heart failure (HCC)    a. 01/2014 Echo: EF 25-30%. b. Echo 03/2015: Improved EF of 40-45%   COPD (chronic obstructive pulmonary disease) (HCC)    Depression    Diabetes mellitus without complication (HCC) 2018   started med therapy approx 2018   Hyperlipidemia    Hypertension    Hypertensive cardiomyopathy (HCC) 06/30/2015   Hypertensive heart disease    a. Since his 20's.   Morbid obesity (HCC)    Obstructive sleep apnea    a. Has not used CPAP since ~ 2012.   Sleep apnea     Current Outpatient Medications  Medication Sig Dispense Refill   Accu-Chek Softclix Lancets lancets Use to check blood sugar three times daily 100 each 0   albuterol (PROAIR HFA) 108 (90 Base) MCG/ACT inhaler Inhale 2 puffs into the lungs every 6 (six) hours as needed for wheezing or shortness of breath. 8.5 g 4   atorvastatin (LIPITOR) 20 MG tablet Take 1 tablet (20 mg total) by mouth daily. 90 tablet 3   benzonatate (TESSALON) 200 MG capsule Take 1 capsule (200 mg total) by mouth 3 (three) times daily as needed for cough. 30 capsule 1   Blood  Glucose Monitoring Suppl (ACCU-CHEK GUIDE) w/Device KIT 1 each by Does not apply route 3 (three) times daily. 1 kit 12   Blood Glucose Monitoring Suppl DEVI 1 each by Does not apply route in the morning, at noon, and at bedtime. May substitute to any manufacturer covered by patient's insurance. 1 each 0   Blood Pressure Monitoring (BLOOD PRESSURE CUFF) MISC USE AS DIRECTED ONCE DAILY AT 12 NOON. 1 each 0   budesonide-formoterol (SYMBICORT) 160-4.5 MCG/ACT inhaler Inhale 2 puffs  into the lungs 2 (two) times daily. 10.2 g 6   cetirizine (ZYRTEC) 10 MG tablet Take 1 tablet (10 mg total) by mouth daily. 90 tablet 1   dapagliflozin propanediol (FARXIGA) 10 MG TABS tablet Take 10 mg by mouth daily.     famotidine (PEPCID) 20 MG tablet Take 1 tablet (20 mg total) by mouth daily after supper. 30 tablet 11   glucose blood (ACCU-CHEK GUIDE) test strip Use as instructed 100 each 12   hydrALAZINE (APRESOLINE) 25 MG tablet Take 1 tablet (25 mg total) by mouth 2 (two) times daily. 180 tablet 1   insulin glargine (LANTUS) 100 UNIT/ML Solostar Pen Inject 31 Units into the skin daily. 30 mL 1   Insulin Pen Needle 30G X 5 MM MISC 1 each by Does not apply route daily. 100 each 6   Insulin Pen Needle 32G X 4 MM MISC Use as directed 100 each 11   ipratropium-albuterol (DUONEB) 0.5-2.5 (3) MG/3ML SOLN Take 3 mLs by nebulization every 4 (four) hours as needed. 180 mL 1   losartan (COZAAR) 100 MG tablet TAKE ONE TABLET BY MOUTH EVERY DAY 90 tablet 4   metFORMIN (GLUCOPHAGE) 500 MG tablet Take 1 tablet (500 mg total) by mouth 2 (two) times daily with a meal. 180 tablet 1   montelukast (SINGULAIR) 10 MG tablet Take 1 tablet (10 mg total) by mouth at bedtime. 30 tablet 3   nebivolol (BYSTOLIC) 5 MG tablet Take 1 tablet (5 mg total) by mouth daily. 90 tablet 3   pantoprazole (PROTONIX) 40 MG tablet Take 1 tablet (40 mg total) by mouth daily. Take 30-60 min before first meal of the day 30 tablet 2   potassium chloride SA  (KLOR-CON M) 20 MEQ tablet TAKE ONE TABLET BY MOUTH 2 TIMES A DAY 180 tablet 4   promethazine-dextromethorphan (PROMETHAZINE-DM) 6.25-15 MG/5ML syrup Take 5 mLs by mouth 4 (four) times daily as needed for cough. 118 mL 0   Rightest GL300 Lancets MISC USE AS DIRECTED. 100 each 0   Semaglutide, 2 MG/DOSE, (OZEMPIC, 2 MG/DOSE,) 8 MG/3ML SOPN Inject 2 mg into the skin once a week. 12 mL 3   torsemide (DEMADEX) 20 MG tablet Take 2 tablets (40 mg total) by mouth once daily. May take additional 1 tablet (20mg  total) in the evening if needed. (Patient taking differently: Take 40 mg by mouth daily. Takes 2 tablets one day and 1 tablet the next day.) 270 tablet 3   No current facility-administered medications for this visit.   Vitals:   10/01/22 0916  BP: (!) 145/74  Pulse: 87  SpO2: 97%  Weight: 292 lb 2 oz (132.5 kg)   Wt Readings from Last 3 Encounters:  10/01/22 292 lb 2 oz (132.5 kg)  09/30/22 289 lb (131.1 kg)  09/13/22 287 lb 9.6 oz (130.5 kg)   Lab Results  Component Value Date   CREATININE 1.47 (H) 08/30/2022   CREATININE 1.68 (H) 04/18/2022   CREATININE 1.65 (H) 11/14/2021   PHYSICAL EXAM:  General:  Well appearing. No resp difficulty HEENT: normal Neck: supple. JVP flat. No lymphadenopathy or thryomegaly appreciated. Cor: PMI normal. Regular rhythm & rate. No rubs, gallops or murmurs. Lungs: clear Abdomen: soft, nontender, nondistended. No hepatosplenomegaly. No bruits or masses.  Extremities: no cyanosis, clubbing, rash, 1+ pitting edema bilateral lower legs Neuro: alert & oriented x3 , cranial nerves grossly intact. Moves all 4 extremities w/o difficulty. Affect pleasant.   ECG: not done   ASSESSMENT & PLAN:  1: NICM with preserved ejection fraction-  - likely due to HTN and OSA - NYHA Class III - euvolemic today - weighing daily; reminded to call for an overnight weight gain of >2 pounds or a weekly weight gain of >5 pounds - weight up 9 pounds from last visit here  2 months ago - Echo 03/27/15: EF 40-45% with mild MR - Echo 09/02/15: EF of 30-35%, mild mitral regurgitation and no aortic stenosis. - Echo 10/28/2018: EF of 60-65% along with mildly elevated PA pressure.  - Echo 09/23/22: EF 55-60% with moderate LVH, Grade I DD - trying to eat low sodium foods when he can but says it's difficult at times due to finances - continue bystolic 5mg  daily  - continue losartan 100mg  daily - continue farxiga 10mg  daily - continue torsemide 40mg  every other day alternating with 20mg  every other day - continue potassium BID - consider adding spironolactone but he hasn't taken his meds yet today so unclear of his BP - encouraged him to use his albuterol inhaler prior to walking up the 3 steps to his apt - get compression socks and put them on every morning with removal at bedtime - decrease fluid intake; currently he estimates that he's drinking 101-118 ounces of fluid daily and he knows that he should be closer to 64 oz; can try sugar free gum/ lollipops to help his dry mouth instead of fluids - BNP 09/01/15 was 1546.0   2: HTN- - BP 145/74 (no meds taken yet today & he didn't take diuretic yesterday) - saw PCP (Ostwalt) 08/24 - continue hydralazine 25mg  BID - BMP 08/30/22 reviewed and shows sodium 143, potassium 4.2, creatinine 1.47 and GFR 56  3: Diabetes- - A1c on 08/30/22 was 7.8% - continue metformin 500mg  BID - continue ozempic 2mg  weekly - has nutritionist appt 10/21/22 - may benefit from endocrinology referral; will defer to PCP  4: Asthma- - has albuterol rescue inhaler, symbicort BID & PRN nebulizer - discussed using his albuterol inhaler prior to exercise (walking up the 3 flights to his apt/ treadmill) - has PFT's this Friday  5: Sleep apnea-  - wearing CPAP nightly as he has a new machine/ mask; says that he has to fill up the reservoir every morning - last sleep study was done 10/16 - saw pulmonology Sherene Sires) 09/24  Return in 3 months,  sooner if needed.

## 2022-10-04 ENCOUNTER — Ambulatory Visit (HOSPITAL_BASED_OUTPATIENT_CLINIC_OR_DEPARTMENT_OTHER): Payer: Medicare Other | Admitting: Internal Medicine

## 2022-10-04 DIAGNOSIS — J4541 Moderate persistent asthma with (acute) exacerbation: Secondary | ICD-10-CM

## 2022-10-04 LAB — PULMONARY FUNCTION TEST
DL/VA % pred: 120 %
DL/VA: 5.25 ml/min/mmHg/L
DLCO cor % pred: 83 %
DLCO cor: 23.04 ml/min/mmHg
DLCO unc % pred: 83 %
DLCO unc: 23.04 ml/min/mmHg
FEF 25-75 Post: 2.4 L/sec
FEF 25-75 Pre: 2.55 L/sec
FEF2575-%Change-Post: -5 %
FEF2575-%Pred-Post: 76 %
FEF2575-%Pred-Pre: 81 %
FEV1-%Change-Post: 6 %
FEV1-%Pred-Post: 64 %
FEV1-%Pred-Pre: 61 %
FEV1-Post: 2.36 L
FEV1-Pre: 2.23 L
FEV1FVC-%Change-Post: -1 %
FEV1FVC-%Pred-Pre: 107 %
FEV6-%Change-Post: 9 %
FEV6-%Pred-Post: 63 %
FEV6-%Pred-Pre: 57 %
FEV6-Post: 2.91 L
FEV6-Pre: 2.64 L
FEV6FVC-%Pred-Post: 104 %
FEV6FVC-%Pred-Pre: 104 %
FVC-%Change-Post: 7 %
FVC-%Pred-Post: 61 %
FVC-%Pred-Pre: 56 %
FVC-Post: 2.91 L
FVC-Pre: 2.7 L
Post FEV1/FVC ratio: 81 %
Post FEV6/FVC ratio: 100 %
Pre FEV1/FVC ratio: 83 %
Pre FEV6/FVC Ratio: 100 %
RV % pred: 90 %
RV: 1.88 L
TLC % pred: 73 %
TLC: 4.95 L

## 2022-10-04 NOTE — Progress Notes (Unsigned)
Referring Physician:  No referring provider defined for this encounter.  Primary Physician:  [name], PA-C  History of Present Illness: 10/07/2022 [name] has a history of heart failure, HTN, hypertensive cardiomyopathy, OSA, COPD, CKD 2, and DM.   Last seen by me 01/17/22 for LBP and left leg pain. He has known lumbar spondylosis L3-S1 with DDD. He has history of DM, but does not have known diabetic neuropathy.   MRI was ordered and denied. He has different insurance and is here for follow up.   He continues with intermittent  LBP with intermittent numbness/tingling in both legs in posterior thigh and constant numbness in left heel. He has "bad knees and bad feet," but no gross weakness. He has good days and bad days.   He is taking prn motrin with some relief.   No bowel or bladder issues.    Conservative measures:  Physical therapy: he did PT in 1992 and 2019 Multimodal medical therapy including regular antiinflammatories: mobic, robaxin, motrin, naprosyn  Injections: No epidural steroid injections  Past Surgery: No spinal surgery  Bradley Mckenzie. has no symptoms of cervical myelopathy.  The symptoms are causing a significant impact on the patient's life.   Review of Systems:  A 10 point review of systems is negative, except for the pertinent positives and negatives detailed in the HPI.  Past Medical History: Past Medical History:  Diagnosis Date   Allergy    Asthma    Cardiomyopathy (HCC) 2016   a. 01/2014 Echo: EF 25-30%;  b. ? ischemic vs non-ischemic.  He's never had an ischemic eval.   Chronic combined systolic (congestive) and diastolic (congestive) heart failure (HCC)    a. 01/2014 Echo: EF 25-30%. b. Echo 03/2015: Improved EF of 40-45%   COPD (chronic obstructive pulmonary disease) (HCC)    Depression    Diabetes mellitus without complication (HCC) 2018   started med therapy approx 2018   Hyperlipidemia    Hypertension    Hypertensive  cardiomyopathy (HCC) 06/30/2015   Hypertensive heart disease    a. Since his 37's.   Morbid obesity (HCC)    Obstructive sleep apnea    a. Has not used CPAP since ~ 2012.   Sleep apnea     Past Surgical History: Past Surgical History:  Procedure Laterality Date   HERNIA REPAIR  2004    Allergies: Allergies as of 10/07/2022   (No Known Allergies)    Medications: Outpatient Encounter Medications as of 10/07/2022  Medication Sig   Accu-Chek Softclix Lancets lancets Use to check blood sugar three times daily   albuterol (PROAIR HFA) 108 (90 Base) MCG/ACT inhaler Inhale 2 puffs into the lungs every 6 (six) hours as needed for wheezing or shortness of breath.   atorvastatin (LIPITOR) 20 MG tablet Take 1 tablet (20 mg total) by mouth daily.   benzonatate (TESSALON) 200 MG capsule Take 1 capsule (200 mg total) by mouth 3 (three) times daily as needed for cough.   Blood Glucose Monitoring Suppl (ACCU-CHEK GUIDE) w/Device KIT 1 each by Does not apply route 3 (three) times daily.   Blood Glucose Monitoring Suppl DEVI 1 each by Does not apply route in the morning, at noon, and at bedtime. May substitute to any manufacturer covered by patient's insurance.   Blood Pressure Monitoring (BLOOD PRESSURE CUFF) MISC USE AS DIRECTED ONCE DAILY AT 12 NOON.   budesonide-formoterol (SYMBICORT) 160-4.5 MCG/ACT inhaler Inhale 2 puffs into the lungs 2 (two) times daily.  cetirizine (ZYRTEC) 10 MG tablet Take 1 tablet (10 mg total) by mouth daily.   dapagliflozin propanediol (FARXIGA) 10 MG TABS tablet Take 10 mg by mouth daily.   famotidine (PEPCID) 20 MG tablet Take 1 tablet (20 mg total) by mouth daily after supper.   glucose blood (ACCU-CHEK GUIDE) test strip Use as instructed   hydrALAZINE (APRESOLINE) 25 MG tablet Take 1 tablet (25 mg total) by mouth 2 (two) times daily.   insulin glargine (LANTUS) 100 UNIT/ML Solostar Pen Inject 31 Units into the skin daily.   Insulin Pen Needle 30G X 5 MM MISC 1  each by Does not apply route daily.   Insulin Pen Needle 32G X 4 MM MISC Use as directed   ipratropium-albuterol (DUONEB) 0.5-2.5 (3) MG/3ML SOLN Take 3 mLs by nebulization every 4 (four) hours as needed.   losartan (COZAAR) 100 MG tablet TAKE ONE TABLET BY MOUTH EVERY DAY   metFORMIN (GLUCOPHAGE) 500 MG tablet Take 1 tablet (500 mg total) by mouth 2 (two) times daily with a meal.   montelukast (SINGULAIR) 10 MG tablet Take 1 tablet (10 mg total) by mouth at bedtime.   nebivolol (BYSTOLIC) 5 MG tablet Take 1 tablet (5 mg total) by mouth daily.   pantoprazole (PROTONIX) 40 MG tablet Take 1 tablet (40 mg total) by mouth daily. Take 30-60 min before first meal of the day   potassium chloride SA (KLOR-CON M) 20 MEQ tablet TAKE ONE TABLET BY MOUTH 2 TIMES A DAY   promethazine-dextromethorphan (PROMETHAZINE-DM) 6.25-15 MG/5ML syrup Take 5 mLs by mouth 4 (four) times daily as needed for cough.   Rightest GL300 Lancets MISC USE AS DIRECTED.   Semaglutide, 2 MG/DOSE, (OZEMPIC, 2 MG/DOSE,) 8 MG/3ML SOPN Inject 2 mg into the skin once a week.   torsemide (DEMADEX) 20 MG tablet Take 2 tabs daily every other day ALTERNATING with 1 tab daily every other day   No facility-administered encounter medications on file as of 10/07/2022.    Social History: Social History   Tobacco Use   Smoking status: Never   Smokeless tobacco: Never   Tobacco comments:    Quit in his 3's.  Vaping Use   Vaping status: Never Used  Substance Use Topics   Alcohol use: Not Currently    Comment: rarely alcohol use at special events   Drug use: Not Currently    Comment: Used marijuana in his teens.    Family Medical History: Family History  Problem Relation Age of Onset   Heart disease Mother        alive & well.   Heart attack Father 84       died @ 70 of cancer.   Diabetes Father    Cancer Father        "in his stomach"   Cancer Sister    Cancer Paternal Uncle        strong FH malignancy multiple relavies      Physical Examination: Vitals:   10/07/22 0843  BP: 138/80      Awake, alert, oriented to person, place, and time.  Speech is clear and fluent. Fund of knowledge is appropriate.   Cranial Nerves: Pupils equal round and reactive to light.  Facial tone is symmetric.    No lower lumbar tenderness.   No abnormal lesions on exposed skin.   Strength: Side Biceps Triceps Deltoid Interossei Grip Wrist Ext. Wrist Flex.  R 5 5 5 5 5 5 5   L 5 5 5  5  5 5 5    Side Iliopsoas Quads Hamstring PF DF EHL  R 5 5 5 5 5 5   L 5 5 5 5 5 5    Reflexes are 2+ and symmetric at the biceps, brachioradialis, patella and achilles.   Hoffman's is absent.  Clonus is not present.   Bilateral upper and lower extremity sensation is intact to light touch.  Gait is normal.     Medical Decision Making  Imaging: Nothing new   Assessment and Plan: Mr. Delbianco is a pleasant 57 y.o. male with a history of chronic LBP x years.   He continues with intermittent  LBP with intermittent numbness/tingling in both legs in posterior thigh and constant numbness in left heel. He has "bad knees and bad feet," but no gross weakness. He has good days and bad days.   He has known lumbar spondylosis L3-S1 with DDD. He has history of DM, but does not have known diabetic neuropathy.   Treatment options discussed with patient and following plan made:   - MRI of lumbar spine to further evaluate chronic LBP and bilateral lumbar radiculopathy. No improvement with time or medications (motrin, robaxin).  - May also consider EMG of bilateral lower extremities at some point.  - Depending on results of MRI, may consider PT and/or injections. He hates needles so is not sure he'd be interested in injections.  - Will schedule f/u for him to see me in clinic to review his MRI once I have the results back.   I spent a total of 15 minutes in face-to-face and non-face-to-face activities related to this patient's care today including review  of outside records, review of imaging, review of symptoms, physical exam, discussion of differential diagnosis, discussion of treatment options, and documentation.   Drake Leach PA-C Dept. of Neurosurgery

## 2022-10-04 NOTE — Patient Instructions (Signed)
Full PFT Performed Today  

## 2022-10-04 NOTE — Progress Notes (Signed)
Full PFT Performed Today  

## 2022-10-07 ENCOUNTER — Ambulatory Visit (INDEPENDENT_AMBULATORY_CARE_PROVIDER_SITE_OTHER): Payer: Medicare Other | Admitting: Orthopedic Surgery

## 2022-10-07 ENCOUNTER — Encounter: Payer: Self-pay | Admitting: Orthopedic Surgery

## 2022-10-07 VITALS — BP 138/80 | Ht 69.0 in | Wt 283.0 lb

## 2022-10-07 DIAGNOSIS — M5416 Radiculopathy, lumbar region: Secondary | ICD-10-CM

## 2022-10-07 DIAGNOSIS — M4726 Other spondylosis with radiculopathy, lumbar region: Secondary | ICD-10-CM

## 2022-10-07 DIAGNOSIS — M47816 Spondylosis without myelopathy or radiculopathy, lumbar region: Secondary | ICD-10-CM

## 2022-10-07 DIAGNOSIS — M5136 Other intervertebral disc degeneration, lumbar region: Secondary | ICD-10-CM

## 2022-10-07 NOTE — Patient Instructions (Signed)
It was so nice to see you today. Thank you so much for coming in.    You have some wear and tear in your back (arthritis) and I think this may be causing your pain.   I want to get an MRI of your lower back to look into things further. We will get this approved through your insurance and [facility] will call you to schedule the appointment.   After you have the MRI, it takes 10-14  days for me to get the results back. Once I have them, we will call you to schedule a follow up visit with me to review them.   Please do not hesitate to call if you have any questions or concerns. You can also message me in MyChart.   If you have not heard back about the MRI in the next week, please call the office so we can help you get it scheduled.   Drake Leach PA-C [phone]

## 2022-10-16 ENCOUNTER — Ambulatory Visit (HOSPITAL_COMMUNITY)
Admission: RE | Admit: 2022-10-16 | Discharge: 2022-10-16 | Disposition: A | Discharge status: Home / Self Care | Payer: Medicare HMO | Source: Ambulatory Visit | Attending: Orthopedic Surgery | Admitting: Orthopedic Surgery

## 2022-10-16 DIAGNOSIS — M47816 Spondylosis without myelopathy or radiculopathy, lumbar region: Secondary | ICD-10-CM | POA: Insufficient documentation

## 2022-10-16 DIAGNOSIS — M5416 Radiculopathy, lumbar region: Secondary | ICD-10-CM | POA: Insufficient documentation

## 2022-10-16 DIAGNOSIS — M5116 Intervertebral disc disorders with radiculopathy, lumbar region: Secondary | ICD-10-CM | POA: Diagnosis not present

## 2022-10-16 DIAGNOSIS — M51369 Other intervertebral disc degeneration, lumbar region without mention of lumbar back pain or lower extremity pain: Secondary | ICD-10-CM | POA: Insufficient documentation

## 2022-10-16 DIAGNOSIS — M5117 Intervertebral disc disorders with radiculopathy, lumbosacral region: Secondary | ICD-10-CM | POA: Diagnosis not present

## 2022-10-16 DIAGNOSIS — M48061 Spinal stenosis, lumbar region without neurogenic claudication: Secondary | ICD-10-CM | POA: Diagnosis not present

## 2022-10-21 ENCOUNTER — Encounter: Payer: Medicare HMO | Attending: Physician Assistant | Admitting: Dietician

## 2022-10-21 ENCOUNTER — Encounter: Payer: Self-pay | Admitting: Dietician

## 2022-10-21 DIAGNOSIS — Z713 Dietary counseling and surveillance: Secondary | ICD-10-CM | POA: Insufficient documentation

## 2022-10-21 DIAGNOSIS — E118 Type 2 diabetes mellitus with unspecified complications: Secondary | ICD-10-CM | POA: Diagnosis not present

## 2022-10-21 NOTE — Progress Notes (Signed)
Diabetes Self-Management Education  Visit Type: First/Initial  Appt. Start Time: 0805 Appt. End Time: 0950  10/21/2022  [name], identified by name and date of birth, is a 57 y.o. male with a diagnosis of Diabetes: Type 2.   ASSESSMENT  There were no vitals taken for this visit. There is no height or weight on file to calculate BMI.  Pt reports taking Lantus @31u  each morning, wife does injections for them, pt reports a few bouts of hypoglycemia, none recently. Pt also taking Farxiga, Metformin, Ozempic for their DM, reports indigestion/diarrhea occasionally. Pt states they are eating a lot less since starting Ozempic ~2 years ago. Pt states certain fast food doesn't taste good anymore. Pt states they have a sweet tooth, but has found normal sweets too sweet now and can't eat them like they used to. Pt reports not eating many vegetables, states they don't eat a variety of veggies, will not eat something if they have a bad experience. Pt reports switching to AccuChek from previous meter, states their BG reading have been higher with the AccuChek than previous meter. Pt states their FBG values were previous 110-120, now 160-180 mg/dL.  Pt reports bouts of dry mouth, low blood pressure, dehydration (years ago), currently taking [place] and Torsemide. Pt reports drinking more Gatorade lately to stay hydrated. Pt reports difficulty being active (treadmill) for longer than 10-15 minutes r/t COPD, Asthma, Back pain. Pt reports walking treadmill 3 times a week, usually in the afternoon. Pt reports some numbness in the heel of L foot over the last year, states the podiatrist told him it is related degenerative discs in their back.   Diabetes Self-Management Education - 10/21/22 0830       Visit Information   Visit Type First/Initial      Initial Visit   Diabetes Type Type 2    Date Diagnosed 03/2017    Are you currently following a meal plan? No    Are you taking your medications as  prescribed? Yes      Health Coping   How would you rate your overall health? Poor   COPD, Asthma, Cardiac problems, Obesity, Inability to do physical activity     Psychosocial Assessment   Patient Belief/Attitude about Diabetes Motivated to manage diabetes    What is the hardest part about your diabetes right now, causing you the most concern, or is the most worrisome to you about your diabetes?   Making healty food and beverage choices;Getting support / problem solving    Self-care barriers Debilitated state due to current medical condition   COPD/Back pain   Self-management support Doctor's office    Other persons present Patient    Patient Concerns Nutrition/Meal planning;Weight Control;Glycemic Control    Special Needs None    Preferred Learning Style No preference indicated    Learning Readiness Contemplating    How often do you need to have someone help you when you read instructions, pamphlets, or other written materials from your doctor or pharmacy? 1 - Never    What is the last grade level you completed in school? GED      Pre-Education Assessment   Patient understands the diabetes disease and treatment process. Needs Instruction    Patient understands incorporating nutritional management into lifestyle. Needs Instruction    Patient undertands incorporating physical activity into lifestyle. Needs Instruction    Patient understands using medications safely. Needs Instruction    Patient understands monitoring blood glucose, interpreting and using results Needs Instruction  Patient understands prevention, detection, and treatment of acute complications. Needs Instruction    Patient understands prevention, detection, and treatment of chronic complications. Needs Instruction    Patient understands how to develop strategies to address psychosocial issues. Needs Instruction    Patient understands how to develop strategies to promote health/change behavior. Needs Instruction       Complications   Last HgB A1C per patient/outside source 7.4 %    How often do you check your blood sugar? 1-2 times/day    Fasting Blood glucose range (mg/dL) [phone]    Have you had a dilated eye exam in the past 12 months? No    Have you had a dental exam in the past 12 months? No    Are you checking your feet? Yes    How many days per week are you checking your feet? 7      Dietary Intake   Breakfast Bowl of cereal (Frosted Flakes), 2% milk    Snack (morning) none    Lunch none    Snack (afternoon) none    Dinner 2 PB and J sandwiches, water    Snack (evening) none    Beverage(s) Kool-Aid (w/ Equal), Water flavored packet      Activity / Exercise   Activity / Exercise Type ADL's;Light (walking / raking leaves)    How many days per week do you exercise? 3    How many minutes per day do you exercise? 15    Total minutes per week of exercise 45      Patient Education   Previous Diabetes Education No    Disease Pathophysiology Factors that contribute to the development of diabetes;Explored patient's options for treatment of their diabetes    Healthy Eating Role of diet in the treatment of diabetes and the relationship between the three main macronutrients and blood glucose level;Plate Method    Being Active Role of exercise on diabetes management, blood pressure control and cardiac health.;Helped patient identify appropriate exercises in relation to his/her diabetes, diabetes complications and other health issue.    Medications Taught/reviewed insulin/injectables, injection, site rotation, insulin/injectables storage and needle disposal.;Reviewed patients medication for diabetes, action, purpose, timing of dose and side effects.    Monitoring Identified appropriate SMBG and/or A1C goals.    Acute complications Taught prevention, symptoms, and  treatment of hypoglycemia - the 15 rule.    Chronic complications Nephropathy, what it is, prevention of, the use of ACE, ARB's and early  detection of through urine microalbumia.;Relationship between chronic complications and blood glucose control;Identified and discussed with patient  current chronic complications    Diabetes Stress and Support Identified and addressed patients feelings and concerns about diabetes;Worked with patient to identify barriers to care and solutions    Lifestyle and Health Coping Lifestyle issues that need to be addressed for better diabetes care      Individualized Goals (developed by patient)   Nutrition Follow meal plan discussed    Physical Activity Exercise 3-5 times per week    Medications take my medication as prescribed    Monitoring  Test my blood glucose as discussed    Problem Solving Eating Pattern    Reducing Risk examine blood glucose patterns;treat hypoglycemia with 15 grams of carbs if blood glucose less than 70mg /dL      Post-Education Assessment   Patient understands the diabetes disease and treatment process. Comprehends key points    Patient understands incorporating nutritional management into lifestyle. Comprehends key points    Patient undertands  incorporating physical activity into lifestyle. Comprehends key points    Patient understands using medications safely. Comphrehends key points    Patient understands monitoring blood glucose, interpreting and using results Comprehends key points    Patient understands prevention, detection, and treatment of acute complications. Comprehends key points    Patient understands prevention, detection, and treatment of chronic complications. Comprehends key points    Patient understands how to develop strategies to address psychosocial issues. Comprehends key points      Outcomes   Expected Outcomes Demonstrated interest in learning. Expect positive outcomes    Future DMSE 2 months    Program Status Not Completed             Individualized Plan for Diabetes Self-Management Training:   Learning Objective:  Patient will have a  greater understanding of diabetes self-management. Patient education plan is to attend individual and/or group sessions per assessed needs and concerns.   Plan:   Patient Instructions  Switch from regular Gatorade to Gatorade ZERO.  Choose Farilife brand milk for a high protein, low carb option.  Choose WHOLE wheat bread in place of Honey wheat.  Move your treadmill walks to the evening about an hour after your dinner meal. Aim for doing this 3-5 days a week until you feel that are at a level of exertion that is still comfortable. Pay attention to how this may change your fasting blood sugar numbers.  Check your blood sugar each morning before eating or drinking (fasting). Look for numbers under 130 mg/dL  Your goal Z6X is below 7.0%   Talk to your cardiologist about the joint pain you are experiencing, this is a common side effect of statins.  Expected Outcomes:  Demonstrated interest in learning. Expect positive outcomes  Education material provided: My Plate  If problems or questions, patient to contact team via:  Phone and Email  Future DSME appointment: 2 months

## 2022-10-21 NOTE — Patient Instructions (Addendum)
Switch from regular Gatorade to Gatorade ZERO.  Choose Farilife brand milk for a high protein, low carb option.  Choose WHOLE wheat bread in place of Honey wheat.  Move your treadmill walks to the evening about an hour after your dinner meal. Aim for doing this 3-5 days a week until you feel that are at a level of exertion that is still comfortable. Pay attention to how this may change your fasting blood sugar numbers.  Check your blood sugar each morning before eating or drinking (fasting). Look for numbers under 130 mg/dL  Your goal H0Q is below 7.0%   Talk to your cardiologist about the joint pain you are experiencing, this is a common side effect of statins.

## 2022-10-25 ENCOUNTER — Other Ambulatory Visit: Payer: Self-pay

## 2022-10-25 ENCOUNTER — Other Ambulatory Visit: Payer: Self-pay | Admitting: Physician Assistant

## 2022-10-25 ENCOUNTER — Ambulatory Visit: Payer: Medicare Other | Admitting: Physician Assistant

## 2022-10-25 DIAGNOSIS — J301 Allergic rhinitis due to pollen: Secondary | ICD-10-CM

## 2022-10-25 MED ORDER — MONTELUKAST SODIUM 10 MG PO TABS
10.0000 mg | ORAL_TABLET | Freq: Every day | ORAL | 3 refills | Status: DC
Start: 2022-10-25 — End: 2023-10-25
  Filled 2022-10-25: qty 30, 30d supply, fill #0
  Filled 2022-11-29: qty 30, 30d supply, fill #1
  Filled 2023-01-18: qty 30, 30d supply, fill #2
  Filled 2023-03-06: qty 30, 30d supply, fill #3

## 2022-10-25 NOTE — Telephone Encounter (Signed)
Requested Prescriptions  Pending Prescriptions Disp Refills   montelukast (SINGULAIR) 10 MG tablet 30 tablet 3    Sig: Take 1 tablet (10 mg total) by mouth at bedtime.     Pulmonology:  Leukotriene Inhibitors Passed - 10/25/2022 10:03 AM      Passed - Valid encounter within last 12 months    Recent Outpatient Visits           1 month ago Type 2 diabetes mellitus with complication [facility])   [facility] [place], [place], PA-C   1 month ago Essential hypertension   [place] [facility] [place], [place], PA-C   5 months ago Essential hypertension   [place] [facility] [place], [place], PA-C   6 months ago Essential hypertension   [place] [facility] [place], [place], PA-C       Future Appointments             In 2 weeks Sherene Sires, Charlaine Dalton, MD [facility]   In 1 month Durwin Nora, Lucina Mellow, MD [facility], [facility]

## 2022-10-29 ENCOUNTER — Other Ambulatory Visit: Payer: Self-pay

## 2022-10-30 ENCOUNTER — Other Ambulatory Visit: Payer: Self-pay

## 2022-11-07 NOTE — Progress Notes (Unsigned)
Bradley Mckenzie., male    DOB: 03-31-1965    MRN: [id number]   Brief patient profile:  3  yobf never really smoked regularly served in [employer] referred to pulmonary clinic in [place]  05/10/2022 by [name] PA  for sob/ cough    Baseline wt 230 around 2016  History of Present Illness  05/10/2022  Pulmonary/ 1st office eval/ Bridgit Eynon / [name] - wt 273/  on maint wixella and prn saba but very poor Radiation protection practitioner Complaint  Patient presents with   Consult    Pt consult he says that he has been experiencing SOB >62yrs was unable to narrow down a timeframe of it worsening. States he smoke around the age of 18-18   Dyspnea:  used to walk any where he wanted but around 2017 noted worsened now has go to slower than  others.  Has to stop on 2nd landing  to catch breath since moving into new appt. Cough: tends to be worse with ex and variably at hs/ non productive Sleep: flat bed x 1 pillows   7 nights/ a week / places cpap always sob and sometimes coughing / has not tried recliner  SABA use: w/in on hour of ov Rec Plan A = Automatic = Always=    Symbicort 80 Take 2 puffs (or Breztri x one puff) first thing in am and then another 2 puffs about 12 hours later.  Work on inhaler technique:  Plan B = Backup (to supplement plan A, not to replace it) Only use your albuterol inhaler as a rescue medication Plan C = Crisis (instead of Plan B but only if Plan B stops working) - only use your albuterol nebulizer if you first try Plan B  Pantoprazole (protonix) 40 mg   Take  30-60 min before first meal of the day and Pepcid (famotidine)  20 mg after supper until return to office - this is the best way to tell whether stomach acid is contributing to your problem.   GERD diet reviewed, bed blocks rec   Please schedule a follow up office visit in 4 weeks, sooner if needed  with all medications /inhalers/ solutions in hand so we can verify exactly what you are taking. This includes all medications from  all doctors and over the counters     09/30/2022  f/u ov/[place] office/Fable Huisman re: sob / cough with elevated FENO c/w asthma  maint on symb 160  2bid did not bring meds as rec  Chief Complaint  Patient presents with   Shortness of Breath   Dyspnea:  x years  x treadmill  x 10 minutes "nl pace" / slt incline  Cough: still on tessalon daily > did not understand prn/ no longer on gerd rx and has overt HB Sleeping: cpap per Katie s resp cc  SABA use: takes it daily  02: none   Rec Plan A = Automatic = Always=    Symbicort 160 Take 2 puffs first thing in am and then another 2 puffs about 12 hours later.  Resume Pantoprazole (protonix) 40 mg   Take  30-60 min before first meal of the day and Pepcid (famotidine)  20 mg after supper until return to office  Plan B = Backup (to supplement plan A, not to replace it) Only use your albuterol inhaler as a rescue medication Plan C = Crisis (instead of Plan B but only if Plan B stops working) - only use your albuterol nebulizer if you first try  Plan B   Please schedule a follow up office visit in 6 weeks, call sooner if needed with all medications /inhalers/ solutions in hand    10/04/22  pfts p symbicort 160 wnl  x for ERV 6% at wt 283   11/08/2022  f/u ov/[place] office/Zeric Baranowski re: asthma/ MO maint on symbicort 160   Chief Complaint  Patient presents with   Moderate persistent asthmatic bronchitis   Dyspnea:  treadmill 3 x per week, 16 min (not electric) and can't set resistance  Cough: x sev days severe cough not responding to tessilon 200 but only taking one a day / min mucoid production Sleeping: having trouble with cpap  mask SABA use: not much at all  02: none    No obvious day to day or daytime variability or assoc excess/ purulent sputum or mucus plugs or hemoptysis or cp or chest tightness, subjective wheeze or overt  hb symptoms.    Also denies any obvious fluctuation of symptoms with weather or environmental changes or other  aggravating or alleviating factors except as outlined above   No unusual exposure hx or h/o childhood pna/ asthma or knowledge of premature birth.  Current Allergies, Complete Past Medical History, Past Surgical History, Family History, and Social History were reviewed in [name] record.  ROS  The following are not active complaints unless bolded Hoarseness, sore throat, dysphagia, dental problems, itching, sneezing,  nasal congestion or discharge of excess mucus or purulent secretions, ear ache,   fever, chills, sweats, unintended wt loss or wt gain, classically pleuritic or exertional cp,  orthopnea pnd or arm/hand swelling  or leg swelling, presyncope, palpitations, abdominal pain, anorexia, nausea, vomiting, diarrhea  or change in bowel habits or change in bladder habits, change in stools or change in urine, dysuria, hematuria,  rash, arthralgias, visual complaints, headache, numbness, weakness or ataxia or problems with walking or coordination,  change in mood or  memory.        Current Meds  Medication Sig   Accu-Chek Softclix Lancets lancets Use to check blood sugar three times daily   albuterol (PROAIR HFA) 108 (90 Base) MCG/ACT inhaler Inhale 2 puffs into the lungs every 6 (six) hours as needed for wheezing or shortness of breath.   atorvastatin (LIPITOR) 20 MG tablet Take 1 tablet (20 mg total) by mouth daily.   benzonatate (TESSALON) 200 MG capsule Take 1 capsule (200 mg total) by mouth 3 (three) times daily as needed for cough.   Blood Glucose Monitoring Suppl (ACCU-CHEK GUIDE) w/Device KIT 1 each by Does not apply route 3 (three) times daily.   Blood Glucose Monitoring Suppl DEVI 1 each by Does not apply route in the morning, at noon, and at bedtime. May substitute to any manufacturer covered by patient's insurance.   Blood Pressure Monitoring (BLOOD PRESSURE CUFF) MISC USE AS DIRECTED ONCE DAILY AT 12 NOON.   budesonide-formoterol (SYMBICORT) 160-4.5  MCG/ACT inhaler Inhale 2 puffs into the lungs 2 (two) times daily.   cetirizine (ZYRTEC) 10 MG tablet Take 1 tablet (10 mg total) by mouth daily.   dapagliflozin propanediol (FARXIGA) 10 MG TABS tablet Take 10 mg by mouth daily.   famotidine (PEPCID) 20 MG tablet Take 1 tablet (20 mg total) by mouth daily after supper.   glucose blood (ACCU-CHEK GUIDE) test strip Use as instructed   hydrALAZINE (APRESOLINE) 25 MG tablet Take 1 tablet (25 mg total) by mouth 2 (two) times daily.   insulin glargine (LANTUS) 100 UNIT/ML Solostar  Pen Inject 31 Units into the skin daily.   Insulin Pen Needle 30G X 5 MM MISC Use as directed daily.   Insulin Pen Needle 32G X 4 MM MISC Use as directed   ipratropium-albuterol (DUONEB) 0.5-2.5 (3) MG/3ML SOLN Take 3 mLs by nebulization every 4 (four) hours as needed.   losartan (COZAAR) 100 MG tablet TAKE ONE TABLET BY MOUTH EVERY DAY   metFORMIN (GLUCOPHAGE) 500 MG tablet Take 1 tablet (500 mg total) by mouth 2 (two) times daily with a meal.   montelukast (SINGULAIR) 10 MG tablet Take 1 tablet (10 mg total) by mouth at bedtime.   nebivolol (BYSTOLIC) 5 MG tablet Take 1 tablet (5 mg total) by mouth daily.   pantoprazole (PROTONIX) 40 MG tablet Take 1 tablet (40 mg total) by mouth daily. Take 30-60 min before first meal of the day   potassium chloride SA (KLOR-CON M) 20 MEQ tablet TAKE ONE TABLET BY MOUTH 2 TIMES A DAY   promethazine-dextromethorphan (PROMETHAZINE-DM) 6.25-15 MG/5ML syrup Take 5 mLs by mouth 4 (four) times daily as needed for cough.   Rightest GL300 Lancets MISC USE AS DIRECTED.   Semaglutide, 2 MG/DOSE, (OZEMPIC, 2 MG/DOSE,) 8 MG/3ML SOPN Inject 2 mg into the skin once a week.   torsemide (DEMADEX) 20 MG tablet Take 2 tabs daily every other day ALTERNATING with 1 tab daily every other day        Past Medical History:  Diagnosis Date   Allergy    Asthma    Cardiomyopathy (HCC) 2016   a. 01/2014 Echo: EF 25-30%;  b. ? ischemic vs non-ischemic.  He's  never had an ischemic eval.   Chronic combined systolic (congestive) and diastolic (congestive) heart failure (HCC)    a. 01/2014 Echo: EF 25-30%. b. Echo 03/2015: Improved EF of 40-45%   COPD (chronic obstructive pulmonary disease) (HCC)    Depression    Diabetes mellitus without complication (HCC) 2018   started med therapy approx 2018   Hyperlipidemia    Hypertension    Hypertensive cardiomyopathy (HCC) 06/30/2015   Hypertensive heart disease    a. Since his 57's.   Morbid obesity (HCC)    Obstructive sleep apnea    a. Has not used CPAP since ~ 2012.   Sleep apnea        Objective:    Wts  11/08/2022     284  09/30/22 289 lb (131.1 kg)  09/13/22 287 lb 9.6 oz (130.5 kg)  09/10/22 283 lb 12.8 oz (128.7 kg)    Vital signs reviewed  11/08/2022  - Note at rest 02 sats  91% on RA   General appearance:    amb MO (by bmi) bm nad    HEENT : Oropharynx  clear          NECK :  without  apparent JVD/ palpable Nodes/TM    LUNGS: no acc muscle use,  Nl contour chest which is clear to A and P bilaterally without cough on insp or exp maneuvers   CV:  RRR  no s3 or murmur or increase in P2, and no edema   ABD:  soft and nontender with nl inspiratory excursion in the supine position. No bruits or organomegaly appreciated   MS:  Nl gait/ ext warm without deformities Or obvious joint restrictions  calf tenderness, cyanosis or clubbing    SKIN: warm and dry without lesions    NEURO:  alert, approp, nl sensorium with  no motor or cerebellar deficits  apparent.         Assessment

## 2022-11-08 ENCOUNTER — Encounter: Payer: Self-pay | Admitting: Internal Medicine

## 2022-11-08 ENCOUNTER — Other Ambulatory Visit: Payer: Self-pay

## 2022-11-08 ENCOUNTER — Ambulatory Visit: Payer: Medicare HMO | Admitting: Internal Medicine

## 2022-11-08 VITALS — BP 127/77 | HR 95 | Ht 69.0 in | Wt 284.0 lb

## 2022-11-08 DIAGNOSIS — J449 Chronic obstructive pulmonary disease, unspecified: Secondary | ICD-10-CM | POA: Diagnosis not present

## 2022-11-08 DIAGNOSIS — J4541 Moderate persistent asthma with (acute) exacerbation: Secondary | ICD-10-CM | POA: Diagnosis not present

## 2022-11-08 DIAGNOSIS — J4531 Mild persistent asthma with (acute) exacerbation: Secondary | ICD-10-CM | POA: Diagnosis not present

## 2022-11-08 MED ORDER — PROMETHAZINE-DM 6.25-15 MG/5ML PO SYRP
5.0000 mL | ORAL_SOLUTION | Freq: Four times a day (QID) | ORAL | 2 refills | Status: DC | PRN
  Filled 2022-11-08: qty 240, 12d supply, fill #0

## 2022-11-08 NOTE — Patient Instructions (Addendum)
Call if me can't work out your cpap issues and we'll send you for a second opinion   Depomedrol 120 mg IM   For cough >  try tessalon 200 mg up to every 4 hours and supplement with  phenergan  DM up to a tsp every 4 hours as needed   Please remember to go to the lab department   for your tests - we will call you with the results when they are available.      Please schedule a follow up visit in 3 months but call sooner if needed

## 2022-11-11 ENCOUNTER — Encounter: Payer: Self-pay | Admitting: Internal Medicine

## 2022-11-11 LAB — CBC WITH DIFFERENTIAL/PLATELET
Basophils Absolute: 0.1 10*3/uL (ref 0.0–0.2)
Basos: 1 %
EOS (ABSOLUTE): 0.3 10*3/uL (ref 0.0–0.4)
Eos: 3 %
Hematocrit: 44.8 % (ref 37.5–51.0)
Hemoglobin: 14.2 g/dL (ref 13.0–17.7)
Immature Grans (Abs): 0 10*3/uL (ref 0.0–0.1)
Immature Granulocytes: 0 %
Lymphocytes Absolute: 2.2 10*3/uL (ref 0.7–3.1)
Lymphs: 22 %
MCH: 27 pg (ref 26.6–33.0)
MCHC: 31.7 g/dL (ref 31.5–35.7)
MCV: 85 fL (ref 79–97)
Monocytes Absolute: 0.9 10*3/uL (ref 0.1–0.9)
Monocytes: 9 %
Neutrophils Absolute: 6.5 10*3/uL (ref 1.4–7.0)
Neutrophils: 65 %
Platelets: 235 10*3/uL (ref 150–450)
RBC: 5.25 x10E6/uL (ref 4.14–5.80)
RDW: 14.5 % (ref 11.6–15.4)
WBC: 9.9 10*3/uL (ref 3.4–10.8)

## 2022-11-11 LAB — IGE: IgE (Immunoglobulin E), Serum: 122 [IU]/mL (ref 6–495)

## 2022-11-11 NOTE — Assessment & Plan Note (Signed)
-   10/04/22  pfts  ERV 6% at wt 283 c/w effects of obesity on lung volumes  Body mass index is 41.94 kg/m.  -   Lab Results  Component Value Date   TSH 1.140 11/22/2020      Contributing to doe/OSA  and risk of GERD/dvt/PE >>>   reviewed the need and the process to achieve and maintain neg calorie balance > defer f/u primary care including intermittently monitoring thyroid status             Each maintenance medication was reviewed in detail including emphasizing most importantly the difference between maintenance and prns and under what circumstances the prns are to be triggered using an action plan format where appropriate.  Total time for H and P, chart review, counseling, reviewing hfa device(s) and generating customized AVS unique to this office visit / same day charting = 31 min

## 2022-11-11 NOTE — Assessment & Plan Note (Signed)
Onset around 2017  - 05/10/2022  After extensive coaching inhaler device,  effectiveness =    25% improved to 755 (Ti too short) > try symbicort 80 2bid and max gerd rx  - FENO 05/10/2022  = 47 on wixella 250  - 05/10/2022   Walked on RA  x  one   lap(s) =  approx 150  ft  @ slow pace, stopped due to tired> sob with lowest 02 sats 92%   - 09/30/2022  After extensive coaching inhaler device,  effectiveness =    80%  - 09/30/2022   Walked on RA  x  3  lap(s) =  approx 450  ft  @ nl  pace, stopped due to end of study  with lowest 02 sats 94%   - 10/04/22  pfts p symbicort wnl  x for ERV 6% at wt 283 - Allergy screen 11/08/2022 >  Eos 0. /  IgE  done during flare of cough  with psueudohweeze on exam  This flare is more not typical of asthma but with psueudowheeze c/w Upper airway cough syndrome (previously labeled PNDS),  is so named because it's frequently impossible to sort out how much is  CR/sinusitis with freq throat clearing (which can be related to primary GERD)   vs  causing  secondary (" extra esophageal")  GERD from wide swings in gastric pressure that occur with throat clearing, often  promoting self use of mint and menthol lozenges that reduce the lower esophageal sphincter tone and exacerbate the problem further in a cyclical fashion.   These are the same pts (now being labeled as having "irritable larynx syndrome" by some cough centers) who not infrequently have a history of having failed to tolerate ace inhibitors,  dry powder inhalers or biphosphonates or report having atypical/extraesophageal reflux symptoms that don't respond to standard doses of PPI  and are easily confused as having aecopd or asthma flares by even experienced allergists/ pulmonologists (myself included).   Of the three most common causes of  Sub-acute / recurrent or chronic cough, only one (GERD)  can actually contribute to/ trigger  the other two (asthma and post nasal drip syndrome)  and perpetuate the cylce of  cough.  While not intuitively obvious, many patients with chronic low grade reflux do not cough until there is a primary insult that disturbs the protective epithelial barrier and exposes sensitive nerve endings.   This is typically viral but can due to PNDS and  either may apply here.     >>>The point is that once this occurs, it is difficult to eliminate the cycle  using anything but a maximally effective acid suppression regimen at least in the short run, accompanied by an appropriate diet to address non acid GERD and control / eliminate the cough itself for at least 3 days with tessalon 200 supplemented with phenergan dm and depomedrole 120 mg IM  in case of component of Th-2 driven upper or lower airways inflammation (if cough responds short term only to relapse before return while will on full rx for uacs (as above), then  that would point to allergic rhinitis/ asthma or eos bronchitis as alternative dx)

## 2022-11-16 NOTE — Progress Notes (Unsigned)
Referring Physician:  [name], PA-C [address] [place],  [place] [address]  Primary Physician:  Bradley Lade, MD  History of Present Illness: 11/20/2022 [name] has a history of heart failure, HTN, hypertensive cardiomyopathy, OSA, COPD, CKD 2, and DM.   Last seen by me 10/07/22 for LBP with intermittent numbness/tingling in both legs in posterior thigh and constant numbness in left heel.   He has known lumbar spondylosis L3-S1 with DDD. He has history of DM, but does not have known diabetic neuropathy.   He is here to review his lumbar MRI scan.   He continues with intermittent  LBP with intermittent numbness/tingling in both legs in posterior thigh and constant numbness in left heel. Pain is worse with increased activity.  He has good days and bad days. Numbness in his left heel is his primary complaint.   He is taking prn motrin with some relief.   No bowel or bladder issues.    Conservative measures:  Physical therapy: he did PT in 1992 and 2019 Multimodal medical therapy including regular antiinflammatories: mobic, robaxin, motrin, naprosyn  Injections: No epidural steroid injections  Past Surgery: No spinal surgery  Bradley Mckenzie. has no symptoms of cervical myelopathy.  The symptoms are causing a significant impact on the patient's life.   Review of Systems:  A 10 point review of systems is negative, except for the pertinent positives and negatives detailed in the HPI.  Past Medical History: Past Medical History:  Diagnosis Date   Allergy    Asthma    Cardiomyopathy (HCC) 2016   a. 01/2014 Echo: EF 25-30%;  b. ? ischemic vs non-ischemic.  He's never had an ischemic eval.   Chronic combined systolic (congestive) and diastolic (congestive) heart failure (HCC)    a. 01/2014 Echo: EF 25-30%. b. Echo 03/2015: Improved EF of 40-45%   COPD (chronic obstructive pulmonary disease) (HCC)    Depression    Diabetes mellitus without  complication (HCC) 2018   started med therapy approx 2018   Hyperlipidemia    Hypertension    Hypertensive cardiomyopathy (HCC) 06/30/2015   Hypertensive heart disease    a. Since his 88's.   Morbid obesity (HCC)    Obstructive sleep apnea    a. Has not used CPAP since ~ 2012.   Sleep apnea     Past Surgical History: Past Surgical History:  Procedure Laterality Date   HERNIA REPAIR  2004    Allergies: Allergies as of 11/20/2022   (No Known Allergies)    Medications: Outpatient Encounter Medications as of 11/20/2022  Medication Sig   Accu-Chek Softclix Lancets lancets Use to check blood sugar three times daily   albuterol (PROAIR HFA) 108 (90 Base) MCG/ACT inhaler Inhale 2 puffs into the lungs every 6 (six) hours as needed for wheezing or shortness of breath.   atorvastatin (LIPITOR) 20 MG tablet Take 1 tablet (20 mg total) by mouth daily.   benzonatate (TESSALON) 200 MG capsule Take 1 capsule (200 mg total) by mouth 3 (three) times daily as needed for cough.   Blood Glucose Monitoring Suppl (ACCU-CHEK GUIDE) w/Device KIT 1 each by Does not apply route 3 (three) times daily.   Blood Glucose Monitoring Suppl DEVI 1 each by Does not apply route in the morning, at noon, and at bedtime. May substitute to any manufacturer covered by patient's insurance.   Blood Pressure Monitoring (BLOOD PRESSURE CUFF) MISC USE AS DIRECTED ONCE DAILY AT 12 NOON.  budesonide-formoterol (SYMBICORT) 160-4.5 MCG/ACT inhaler Inhale 2 puffs into the lungs 2 (two) times daily.   cetirizine (ZYRTEC) 10 MG tablet Take 1 tablet (10 mg total) by mouth daily.   dapagliflozin propanediol (FARXIGA) 10 MG TABS tablet Take 10 mg by mouth daily.   famotidine (PEPCID) 20 MG tablet Take 1 tablet (20 mg total) by mouth daily after supper.   glucose blood (ACCU-CHEK GUIDE) test strip Use as instructed   hydrALAZINE (APRESOLINE) 25 MG tablet Take 1 tablet (25 mg total) by mouth 2 (two) times daily.   insulin glargine  (LANTUS) 100 UNIT/ML Solostar Pen Inject 31 Units into the skin daily.   Insulin Pen Needle 30G X 5 MM MISC Use as directed daily.   Insulin Pen Needle 32G X 4 MM MISC Use as directed   ipratropium-albuterol (DUONEB) 0.5-2.5 (3) MG/3ML SOLN Take 3 mLs by nebulization every 4 (four) hours as needed.   losartan (COZAAR) 100 MG tablet TAKE ONE TABLET BY MOUTH EVERY DAY   metFORMIN (GLUCOPHAGE) 500 MG tablet Take 1 tablet (500 mg total) by mouth 2 (two) times daily with a meal.   montelukast (SINGULAIR) 10 MG tablet Take 1 tablet (10 mg total) by mouth at bedtime.   nebivolol (BYSTOLIC) 5 MG tablet Take 1 tablet (5 mg total) by mouth daily.   pantoprazole (PROTONIX) 40 MG tablet Take 1 tablet (40 mg total) by mouth daily. Take 30-60 min before first meal of the day   potassium chloride SA (KLOR-CON M) 20 MEQ tablet TAKE ONE TABLET BY MOUTH 2 TIMES A DAY   predniSONE (DELTASONE) 10 MG tablet Take 4 tablets (40 mg total) by mouth daily for 2 days, THEN 3 tablets (30 mg total) daily for 2 days, THEN 2 tablets (20 mg total) daily for 2 days, THEN 1 tablet (10 mg total) daily for 2 days.   promethazine-dextromethorphan (PROMETHAZINE-DM) 6.25-15 MG/5ML syrup Take 5 mLs by mouth 4 (four) times daily as needed for cough.   Rightest GL300 Lancets MISC USE AS DIRECTED.   Semaglutide, 2 MG/DOSE, (OZEMPIC, 2 MG/DOSE,) 8 MG/3ML SOPN Inject 2 mg into the skin once a week.   Tiotropium Bromide Monohydrate (SPIRIVA RESPIMAT) 1.25 MCG/ACT AERS Inhale 2 puffs into the lungs daily.   torsemide (DEMADEX) 20 MG tablet Take 2 tabs daily every other day ALTERNATING with 1 tab daily every other day   zolpidem (AMBIEN) 5 MG tablet Take 1 tablet (5 mg total) by mouth at bedtime as needed for sleep.   No facility-administered encounter medications on file as of 11/20/2022.    Social History: Social History   Tobacco Use   Smoking status: Never   Smokeless tobacco: Never   Tobacco comments:    Quit in his 45's.  Vaping  Use   Vaping status: Never Used  Substance Use Topics   Alcohol use: Not Currently    Comment: rarely alcohol use at special events   Drug use: Not Currently    Comment: Used marijuana in his teens.    Family Medical History: Family History  Problem Relation Age of Onset   Heart disease Mother        alive & well.   Heart attack Father 66       died @ 44 of cancer.   Diabetes Father    Cancer Father        "in his stomach"   Cancer Sister    Cancer Paternal Uncle        strong FH  malignancy multiple relavies     Physical Examination: Vitals:   11/20/22 0932  BP: 128/82       Awake, alert, oriented to person, place, and time.  Speech is clear and fluent. Fund of knowledge is appropriate.   Cranial Nerves: Pupils equal round and reactive to light.  Facial tone is symmetric.    No lower lumbar tenderness.   No abnormal lesions on exposed skin.   Strength:  Side Iliopsoas Quads Hamstring PF DF EHL  R 5 5 5 5 5 5   L 5 5 5 5 5 5    Reflexes are 2+ and symmetric at the patella and achilles.    Clonus is not present.   Bilateral lower extremity sensation is intact to light touch.  Gait is normal.     Medical Decision Making  Imaging: Lumbar MRI scan dated 10/16/22:  FINDINGS: Segmentation: 5 lumbar type vertebral bodies as numbered previously.   Alignment:  Normal   Vertebrae:  No fracture or focal bone lesion.   Conus medullaris and cauda equina: Conus extends to the L1 level. Conus and cauda equina appear normal.   Paraspinal and other soft tissues: Negative   Disc levels:   No abnormality from T12-L1 through L2-3.   L3-4: Desiccation and mild bulging of the disc. Mild narrowing of the lateral recesses but no likely neural compression. Slight worsening since 2018.   L4-5: Desiccation and mild bulging of the disc. Mild facet and ligamentous hypertrophy. Mild stenosis of the lateral recesses and proximal foramina but no definite neural compression.  Similar appearance to the prior exam.   L5-S1: Desiccation and bulging of the disc slightly more prominent towards the right. Mild facet and ligamentous hypertrophy. Mild narrowing of the right lateral recess but no visible neural compression. Similar appearance to the prior exam.   IMPRESSION: 1. L3-4: Disc bulge. Mild narrowing of the lateral recesses but no likely neural compression. Slight worsening since 2018. 2. L4-5: Disc bulge. Mild facet and ligamentous hypertrophy. Mild stenosis of the lateral recesses and proximal foramina but no definite neural compression. Similar appearance to the prior exam. 3. L5-S1: Disc bulge slightly more prominent towards the right. Mild facet and ligamentous hypertrophy. Mild narrowing of the right lateral recess but no visible neural compression. Similar appearance to the prior exam.     Electronically Signed   By: Paulina Fusi M.D.   On: 11/06/2022 11:19   I have personally reviewed the images and agree with the above interpretation.   Assessment and Plan: Mr. Tribbett is a pleasant 57 y.o. male with a history of chronic LBP x years.   He continues with intermittent  LBP with intermittent numbness/tingling in both legs in posterior thigh and constant numbness in left heel.   Numbness in his left heel is his primary complaint.   He has known lumbar spondylosis L3-S1 with DDD. He has mild lateral recess stenosis bilaterally at L3-L4 and L4-L5 with mild lateral recess stenosis on right at L5-S1. No significant compression noted.   LBP is likely due to underlying spondylosis and DDD. I am not sure that numbness/tingling in legs and numbness in right heel is spine mediated.   He has history of DM, but does not have known diabetic neuropathy.   Treatment options discussed with patient and following plan made:   - PT recommended for his lower back. He declines for now.  - Discussed lumbar injections and he declines as well (hates needles).  -  EMG of bilateral  lower extremities to evaluate numbness and tingling. Orders to [facility].  - Will schedule phone visit to review EMG results.   I spent a total of 15 minutes in face-to-face and non-face-to-face activities related to this patient's care today including review of outside records, review of imaging, review of symptoms, physical exam, discussion of differential diagnosis, discussion of treatment options, and documentation.   Drake Leach PA-C Dept. of Neurosurgery

## 2022-11-19 ENCOUNTER — Other Ambulatory Visit: Payer: Self-pay

## 2022-11-19 ENCOUNTER — Ambulatory Visit: Payer: Medicare HMO | Admitting: Nurse Practitioner

## 2022-11-19 ENCOUNTER — Encounter: Payer: Self-pay | Admitting: Nurse Practitioner

## 2022-11-19 VITALS — BP 116/74 | HR 93 | Temp 97.7°F | Ht 69.0 in | Wt 287.6 lb

## 2022-11-19 DIAGNOSIS — J4541 Moderate persistent asthma with (acute) exacerbation: Secondary | ICD-10-CM | POA: Diagnosis not present

## 2022-11-19 DIAGNOSIS — G4733 Obstructive sleep apnea (adult) (pediatric): Secondary | ICD-10-CM | POA: Diagnosis not present

## 2022-11-19 DIAGNOSIS — F5101 Primary insomnia: Secondary | ICD-10-CM | POA: Diagnosis not present

## 2022-11-19 LAB — POCT EXHALED NITRIC OXIDE: FeNO level (ppb): 36

## 2022-11-19 MED ORDER — SPIRIVA RESPIMAT 1.25 MCG/ACT IN AERS
2.0000 | INHALATION_SPRAY | Freq: Every day | RESPIRATORY_TRACT | 5 refills | Status: DC
  Filled 2022-11-19: qty 4, 30d supply, fill #0

## 2022-11-19 MED ORDER — PREDNISONE 10 MG PO TABS
ORAL_TABLET | ORAL | 0 refills | Status: AC
  Filled 2022-11-19: qty 20, 8d supply, fill #0

## 2022-11-19 MED ORDER — ZOLPIDEM TARTRATE 5 MG PO TABS
5.0000 mg | ORAL_TABLET | Freq: Every evening | ORAL | 1 refills | Status: DC | PRN
  Filled 2022-11-19: qty 30, 30d supply, fill #0

## 2022-11-19 NOTE — Patient Instructions (Addendum)
Continue to use CPAP every night, minimum of 4-6 hours a night.  Change equipment as directed. Wash your tubing with warm soap and water daily, hang to dry. Wash humidifier portion weekly. Use bottled, distilled water and change daily  Be aware of reduced alertness and do not drive or operate heavy machinery if experiencing this or drowsiness.  Exercise encouraged, as tolerated. Healthy weight management discussed.  Notify if persistent daytime sleepiness occurs even with consistent use of CPAP.   We discussed how untreated sleep apnea puts an individual at risk for cardiac arrhthymias, pulm HTN, DM, stroke and increases their risk for daytime accidents.  Start Spiriva 2 puffs daily. If this isn't covered by insurance, we will find an alternative  Continue Albuterol inhaler 2 puffs every 6 hours as needed for shortness of breath or wheezing. Notify if symptoms persist despite rescue inhaler/neb use.  Continue Symbicort 2 puff Twice daily. Brush tongue and rinse mouth afterwards.  Continue singulair 1 tab At bedtime  Prednisone taper. 4 tabs for 2 days, then 3 tabs for 2 days, 2 tabs for 2 days, then 1 tab for 2 days, then stop. Take in AM with food    Follow up in 6-8 weeks to see how medications are working for you with Dr. Sherene Sires or Philis Nettle. If symptoms do not improve or worsen, please contact office for sooner follow up or seek emergency care.

## 2022-11-19 NOTE — Assessment & Plan Note (Addendum)
Asthma exacerbation with elevated exhaled nitric oxide testing and bronchospasm on exam. This is his second exacerbation over the last 2 months. Prednisone taper. Step up to triple therapy with addition of Spiriva. Side effect profile reviewed. Teachback performed. Action plan in place. May consider biologic therapy if he remains poorly controlled; peripheral eos 300. Reassess at follow up.   Patient Instructions  Continue to use CPAP every night, minimum of 4-6 hours a night.  Change equipment as directed. Wash your tubing with warm soap and water daily, hang to dry. Wash humidifier portion weekly. Use bottled, distilled water and change daily  Be aware of reduced alertness and do not drive or operate heavy machinery if experiencing this or drowsiness.  Exercise encouraged, as tolerated. Healthy weight management discussed.  Notify if persistent daytime sleepiness occurs even with consistent use of CPAP.   We discussed how untreated sleep apnea puts an individual at risk for cardiac arrhthymias, pulm HTN, DM, stroke and increases their risk for daytime accidents.  Start Spiriva 2 puffs daily. If this isn't covered by insurance, we will find an alternative  Continue Albuterol inhaler 2 puffs every 6 hours as needed for shortness of breath or wheezing. Notify if symptoms persist despite rescue inhaler/neb use.  Continue Symbicort 2 puff Twice daily. Brush tongue and rinse mouth afterwards.  Continue singulair 1 tab At bedtime  Prednisone taper. 4 tabs for 2 days, then 3 tabs for 2 days, 2 tabs for 2 days, then 1 tab for 2 days, then stop. Take in AM with food    Follow up in 6-8 weeks to see how medications are working for you with Dr. Sherene Sires or Philis Nettle. If symptoms do not improve or worsen, please contact office for sooner follow up or seek emergency care.

## 2022-11-19 NOTE — Assessment & Plan Note (Addendum)
Severe OSA on CPAP. Received new machine. Wears nightly per his report. He has excellent compliance and control. Does receive benefit from wearing. Understands proper use/care of device. Aware of safe driving practices. Persistent daytime sleepiness seems to be related to restless sleep, despite CPAP usage.

## 2022-11-19 NOTE — Progress Notes (Signed)
@Patient  ID: Bradley Mckenzie., male    DOB: 1965-02-10, 57 y.o.   MRN: [id number]  Chief Complaint  Patient presents with   Follow-up    Using CPAP nightly at least 4 hours a night.  Adjusted temperature on CPAP 2 weeks ago.    Referring provider: [name], PA-C  HPI: 57 year old male, former remote smoker followed for asthma. He is a patient of Dr. Thurston Hole and last seen in office 11/08/2022. Past medical history significant for CHF, HTN, cardiomyopathy, OSA on CPAP, DM, CKD, obesity, insomnia, HLD.  TEST/EVENTS:  \07/15/2014 PSG: AHI 47.1/h, SpO2 low 77% 10/28/2018 echo: EF 60-65%. Trivial TR. Mildly elevated PASP 05/10/2022 FeNO 47 ppb 08/30/2022 CXR: lungs are clear  09/10/2022 FeNO 50 ppb  05/10/2022: OV with Dr. Sherene Sires. Maintained on [place] and PRN SABA. Use to walk wherever he wanted but around 2017 noted worsened. Has to stop on 2nd landing to catch breath since moving into new appt. Cough tends to be worse with exertion and variably at night. FeNO 47 ppb. Change to [facility] style inhaler. Need to try off DPI. Continue singulair. May need repeat echo.  09/10/2022: OV with Beck Cofer NP to establish care for OSA/CPAP and follow up of asthma. He's been on CPAP for many years. First sleep study was in 2009; next study 2016. He got a refurbished machine a few years ago. Feels like he has not been sleeping as well with it and wakes feeling poorly rested most days. He's also waking up more at night, which he isn't sure if that's his asthma or his sleep. He feels tired most days. He denies morning headaches, drowsy driving, sleep parasomnias/paralysis.  He has trouble with falling asleep. Going to bed anywhere between 10pm and 5 am. He wakes several times a night. Usually gets up between 10-11 am. He is on disability. His CPAP is set on 16 cmH2O. Full face mask. Does not wear oxygen.  He has a history of HTN, CHF, DM. No shx of stroke. He quit smoking in his 20's. He does not drink alcohol. No  excessive caffeine intake. No sleep aids. Lives with his wife. Family history of heart disease.  Regarding his breathing, he's still having trouble with a persistent cough. It becomes paroxysmal at times. Sometimes produces a small amount of yellow phlegm but it takes him a while to get it up. He has a lot of chest tightness and wheezing. He gets out of breath just walking back to the exam room. Sometimes wakes up feeling short winded or coughing. Denies any fevers, chills, hemoptysis, leg swelling, sinus symptoms. He is on [employer]. Uses albuterol a few times a week.  06/12/2022-09/09/2022: CPAP 16 cmH2O 90/90 days; 99% >4 hr; average use 8 hr 11 min Leaks 27.7 AHI 1.8  11/19/2022: Today - follow up Patient presents today for follow up. Received new CPAP machine. It's working fine. Sometimes runs out of water at night. He does feel sleep is better with the CPAP than without. Unfortunately, just doesn't feel rested when he wakes up. Still feeling tired during the day. He attributes this to restless sleep at night. He also has trouble falling asleep more nights than not. He tells me he can be exhausted from the day and go to lay down and feel wide awake. It can take hours for him to fall asleep. He's frustrated by his constant fatigue. He denies any drowsy driving, sleep parasomnias/paralysis. Mask - F20 memory foam  Regarding his breathing, he felt  better after the last course of steroids and changing his inhaler. He felt like he could walk a little faster and exercise more easily. Unfortunately, feels like he's getting back to how he was last time I saw him. He has a persistent cough, dry for the most part. He's getting out of breath more easily. Wheezing more. Denies any fevers, chills, hemoptysis, leg swelling, sinus symptoms. He's using Symbicort twice a day. Uses albuterol a few times a week.   FeNO 36 ppb  10/20/2022-11/18/2022: CPAP 10-18 cmH2O 30/30 days; 100% >4 hr; average use 8 hr 29  min Pressure 95th 13.8 Leaks 95th 13.4 AHI 0.9  No Known Allergies   There is no immunization history on file for this patient.  Past Medical History:  Diagnosis Date   Allergy    Asthma    Cardiomyopathy (HCC) 2016   a. 01/2014 Echo: EF 25-30%;  b. ? ischemic vs non-ischemic.  He's never had an ischemic eval.   Chronic combined systolic (congestive) and diastolic (congestive) heart failure (HCC)    a. 01/2014 Echo: EF 25-30%. b. Echo 03/2015: Improved EF of 40-45%   COPD (chronic obstructive pulmonary disease) (HCC)    Depression    Diabetes mellitus without complication (HCC) 2018   started med therapy approx 2018   Hyperlipidemia    Hypertension    Hypertensive cardiomyopathy (HCC) 06/30/2015   Hypertensive heart disease    a. Since his 88's.   Morbid obesity (HCC)    Obstructive sleep apnea    a. Has not used CPAP since ~ 2012.   Sleep apnea     Tobacco History: Social History   Tobacco Use  Smoking Status Never  Smokeless Tobacco Never  Tobacco Comments   Quit in his 20's.   Counseling given: Not Answered Tobacco comments: Quit in his 41's.   Outpatient Medications Prior to Visit  Medication Sig Dispense Refill   Accu-Chek Softclix Lancets lancets Use to check blood sugar three times daily 100 each 0   albuterol (PROAIR HFA) 108 (90 Base) MCG/ACT inhaler Inhale 2 puffs into the lungs every 6 (six) hours as needed for wheezing or shortness of breath. 8.5 g 4   atorvastatin (LIPITOR) 20 MG tablet Take 1 tablet (20 mg total) by mouth daily. 90 tablet 3   benzonatate (TESSALON) 200 MG capsule Take 1 capsule (200 mg total) by mouth 3 (three) times daily as needed for cough. 30 capsule 1   Blood Glucose Monitoring Suppl (ACCU-CHEK GUIDE) w/Device KIT 1 each by Does not apply route 3 (three) times daily. 1 kit 12   Blood Glucose Monitoring Suppl DEVI 1 each by Does not apply route in the morning, at noon, and at bedtime. May substitute to any manufacturer covered by  patient's insurance. 1 each 0   Blood Pressure Monitoring (BLOOD PRESSURE CUFF) MISC USE AS DIRECTED ONCE DAILY AT 12 NOON. 1 each 0   budesonide-formoterol (SYMBICORT) 160-4.5 MCG/ACT inhaler Inhale 2 puffs into the lungs 2 (two) times daily. 10.2 g 6   cetirizine (ZYRTEC) 10 MG tablet Take 1 tablet (10 mg total) by mouth daily. 90 tablet 1   dapagliflozin propanediol (FARXIGA) 10 MG TABS tablet Take 10 mg by mouth daily.     famotidine (PEPCID) 20 MG tablet Take 1 tablet (20 mg total) by mouth daily after supper. 30 tablet 11   glucose blood (ACCU-CHEK GUIDE) test strip Use as instructed 100 each 12   hydrALAZINE (APRESOLINE) 25 MG tablet Take 1 tablet (25  mg total) by mouth 2 (two) times daily. 180 tablet 1   insulin glargine (LANTUS) 100 UNIT/ML Solostar Pen Inject 31 Units into the skin daily. 30 mL 1   Insulin Pen Needle 30G X 5 MM MISC Use as directed daily. 100 each 6   Insulin Pen Needle 32G X 4 MM MISC Use as directed 100 each 11   ipratropium-albuterol (DUONEB) 0.5-2.5 (3) MG/3ML SOLN Take 3 mLs by nebulization every 4 (four) hours as needed. 180 mL 1   losartan (COZAAR) 100 MG tablet TAKE ONE TABLET BY MOUTH EVERY DAY 90 tablet 4   metFORMIN (GLUCOPHAGE) 500 MG tablet Take 1 tablet (500 mg total) by mouth 2 (two) times daily with a meal. 180 tablet 1   montelukast (SINGULAIR) 10 MG tablet Take 1 tablet (10 mg total) by mouth at bedtime. 30 tablet 3   nebivolol (BYSTOLIC) 5 MG tablet Take 1 tablet (5 mg total) by mouth daily. 90 tablet 3   pantoprazole (PROTONIX) 40 MG tablet Take 1 tablet (40 mg total) by mouth daily. Take 30-60 min before first meal of the day 30 tablet 2   potassium chloride SA (KLOR-CON M) 20 MEQ tablet TAKE ONE TABLET BY MOUTH 2 TIMES A DAY 180 tablet 4   promethazine-dextromethorphan (PROMETHAZINE-DM) 6.25-15 MG/5ML syrup Take 5 mLs by mouth 4 (four) times daily as needed for cough. 240 mL 2   Rightest GL300 Lancets MISC USE AS DIRECTED. 100 each 0   Semaglutide,  2 MG/DOSE, (OZEMPIC, 2 MG/DOSE,) 8 MG/3ML SOPN Inject 2 mg into the skin once a week. 12 mL 3   torsemide (DEMADEX) 20 MG tablet Take 2 tabs daily every other day ALTERNATING with 1 tab daily every other day     No facility-administered medications prior to visit.     Review of Systems:   Constitutional: No weight loss or gain, night sweats, fevers, chills, or lassitude. +fatigue  HEENT: No headaches, difficulty swallowing, tooth/dental problems, or sore throat. No sneezing, itching, ear ache, nasal congestion, or post nasal drip CV:  No chest pain, orthopnea, PND, swelling in lower extremities, anasarca, dizziness, palpitations, syncope Resp: +shortness of breath with exertion; cough; wheezing; chest tightness. No excess mucus or change in color of mucus. No hemoptysis. No chest wall deformity GI:  No heartburn, indigestion GU: No dysuria, change in color of urine, urgency or frequency.   Skin: No rash, lesions, ulcerations MSK:  No joint pain or swelling.   Neuro: No dizziness or lightheadedness.  Psych: +stable depression, anxiety. No SI/HI. Mood stable. +sleep disturbance     Physical Exam:  BP 116/74 (BP Location: Right Arm, Patient Position: Sitting, Cuff Size: Large)   Pulse 93   Temp 97.7 F (36.5 C) (Oral)   Ht 5\' 9"  (1.753 m)   Wt 287 lb 9.6 oz (130.5 kg)   SpO2 94%   BMI 42.47 kg/m   GEN: Pleasant, interactive, well-kempt; obese; in no acute distress. HEENT:  Normocephalic and atraumatic. PERRLA. Sclera white. Nasal turbinates pink, moist and patent bilaterally. No rhinorrhea present. Oropharynx pink and moist, without exudate or edema. No lesions, ulcerations, or postnasal drip. Mallampati III NECK:  Supple w/ fair ROM. No JVD present. Normal carotid impulses w/o bruits. Thyroid symmetrical with no goiter or nodules palpated. No lymphadenopathy.   CV: RRR, no m/r/g, no peripheral edema. Pulses intact, +2 bilaterally. No cyanosis, pallor or clubbing. PULMONARY:   Unlabored, regular breathing. Scattered wheezes bilaterally A&P. No accessory muscle use.  GI: BS present  and normoactive. Soft, non-tender to palpation. No organomegaly or masses detected.  MSK: No erythema, warmth or tenderness. Cap refil <2 sec all extrem. No deformities or joint swelling noted.  Neuro: A/Ox3. No focal deficits noted.   Skin: Warm, no lesions or rashe Psych: Normal affect and behavior. Judgement and thought content appropriate.     Lab Results:  CBC    Component Value Date/Time   WBC 9.9 11/08/2022 1008   WBC 9.9 12/29/2020 1609   RBC 5.25 11/08/2022 1008   RBC 5.07 12/29/2020 1609   HGB 14.2 11/08/2022 1008   HCT 44.8 11/08/2022 1008   PLT 235 11/08/2022 1008   MCV 85 11/08/2022 1008   MCV 86 02/07/2014 0427   MCH 27.0 11/08/2022 1008   MCH 26.4 12/29/2020 1609   MCHC 31.7 11/08/2022 1008   MCHC 31.6 12/29/2020 1609   RDW 14.5 11/08/2022 1008   RDW 16.1 (H) 02/07/2014 0427   LYMPHSABS 2.2 11/08/2022 1008   LYMPHSABS 1.4 02/07/2014 0427   MONOABS 0.9 06/06/2020 2250   MONOABS 0.8 02/07/2014 0427   EOSABS 0.3 11/08/2022 1008   EOSABS 0.2 02/07/2014 0427   BASOSABS 0.1 11/08/2022 1008   BASOSABS 0.1 02/07/2014 0427    BMET    Component Value Date/Time   NA 143 08/30/2022 1622   NA 141 02/07/2014 0427   K 4.2 08/30/2022 1622   K 3.2 (L) 02/07/2014 0427   CL 103 08/30/2022 1622   CL 103 02/07/2014 0427   CO2 25 08/30/2022 1622   CO2 31 02/07/2014 0427   GLUCOSE 102 (H) 08/30/2022 1622   GLUCOSE 191 (H) 12/29/2020 1609   GLUCOSE 115 (H) 02/07/2014 0427   BUN 11 08/30/2022 1622   BUN 14 02/07/2014 0427   CREATININE 1.47 (H) 08/30/2022 1622   CREATININE 1.24 02/07/2014 0427   CALCIUM 9.8 08/30/2022 1622   CALCIUM 8.6 02/07/2014 0427   GFRNONAA 47 (L) 12/29/2020 1609   GFRNONAA >60 02/07/2014 0427   GFRAA 57 (L) 02/24/2020 1842   GFRAA >60 02/07/2014 0427    BNP    Component Value Date/Time   BNP 1,546.0 (H) 09/01/2015 1122      Imaging:  No results found.  Administration History     None          Latest Ref Rng & Units 10/04/2022    3:12 PM  PFT Results  FVC-Pre L 2.70   FVC-Predicted Pre % 56   FVC-Post L 2.91   FVC-Predicted Post % 61   Pre FEV1/FVC % % 83   Post FEV1/FCV % % 81   FEV1-Pre L 2.23   FEV1-Predicted Pre % 61   FEV1-Post L 2.36   DLCO uncorrected ml/min/mmHg 23.04   DLCO UNC% % 83   DLCO corrected ml/min/mmHg 23.04   DLCO COR %Predicted % 83   DLVA Predicted % 120   TLC L 4.95   TLC % Predicted % 73   RV % Predicted % 90     No results found for: "NITRICOXIDE"      Assessment & Plan:   Asthmatic bronchitis Asthma exacerbation with elevated exhaled nitric oxide testing and bronchospasm on exam. This is his second exacerbation over the last 2 months. Prednisone taper. Step up to triple therapy with addition of Spiriva. Side effect profile reviewed. Teachback performed. Action plan in place. May consider biologic therapy if he remains poorly controlled; peripheral eos 300. Reassess at follow up.   Patient Instructions  Continue to use CPAP every  night, minimum of 4-6 hours a night.  Change equipment as directed. Wash your tubing with warm soap and water daily, hang to dry. Wash humidifier portion weekly. Use bottled, distilled water and change daily  Be aware of reduced alertness and do not drive or operate heavy machinery if experiencing this or drowsiness.  Exercise encouraged, as tolerated. Healthy weight management discussed.  Notify if persistent daytime sleepiness occurs even with consistent use of CPAP.   We discussed how untreated sleep apnea puts an individual at risk for cardiac arrhthymias, pulm HTN, DM, stroke and increases their risk for daytime accidents.  Start Spiriva 2 puffs daily. If this isn't covered by insurance, we will find an alternative  Continue Albuterol inhaler 2 puffs every 6 hours as needed for shortness of breath or wheezing. Notify if  symptoms persist despite rescue inhaler/neb use.  Continue Symbicort 2 puff Twice daily. Brush tongue and rinse mouth afterwards.  Continue singulair 1 tab At bedtime  Prednisone taper. 4 tabs for 2 days, then 3 tabs for 2 days, 2 tabs for 2 days, then 1 tab for 2 days, then stop. Take in AM with food    Follow up in 6-8 weeks to see how medications are working for you with Dr. Sherene Sires or Philis Nettle. If symptoms do not improve or worsen, please contact office for sooner follow up or seek emergency care.    OSA on CPAP Severe OSA on CPAP. Received new machine. Wears nightly per his report. He has excellent compliance and control. Does receive benefit from wearing. Understands proper use/care of device. Aware of safe driving practices. Persistent daytime sleepiness seems to be related to restless sleep, despite CPAP usage.   Insomnia Difficulties with sleep latency and maintenance. No previous prescription sleep aids. Compliant with CPAP without much change. Sleep hygiene reviewed. Will start him on ambien At bedtime for sleep. Side effect profile reviewed. Understands to not drive after taking.    I spent 42 minutes of dedicated to the care of this patient on the date of this encounter to include pre-visit review of records, face-to-face time with the patient discussing conditions above, post visit ordering of testing, clinical documentation with the electronic health record, making appropriate referrals as documented, and communicating necessary findings to members of the patients care team.  Noemi Chapel, NP 11/19/2022  Pt aware and understands NP's role.

## 2022-11-19 NOTE — Assessment & Plan Note (Signed)
Difficulties with sleep latency and maintenance. No previous prescription sleep aids. Compliant with CPAP without much change. Sleep hygiene reviewed. Will start him on ambien At bedtime for sleep. Side effect profile reviewed. Understands to not drive after taking.

## 2022-11-20 ENCOUNTER — Other Ambulatory Visit: Payer: Self-pay

## 2022-11-20 ENCOUNTER — Ambulatory Visit: Payer: Medicare HMO | Admitting: Orthopedic Surgery

## 2022-11-20 ENCOUNTER — Encounter: Payer: Self-pay | Admitting: Orthopedic Surgery

## 2022-11-20 VITALS — BP 128/82 | Ht 69.0 in | Wt 288.0 lb

## 2022-11-20 DIAGNOSIS — M48061 Spinal stenosis, lumbar region without neurogenic claudication: Secondary | ICD-10-CM

## 2022-11-20 DIAGNOSIS — M5136 Other intervertebral disc degeneration, lumbar region with discogenic back pain only: Secondary | ICD-10-CM | POA: Diagnosis not present

## 2022-11-20 DIAGNOSIS — R202 Paresthesia of skin: Secondary | ICD-10-CM | POA: Diagnosis not present

## 2022-11-20 DIAGNOSIS — M47816 Spondylosis without myelopathy or radiculopathy, lumbar region: Secondary | ICD-10-CM

## 2022-11-20 DIAGNOSIS — R200 Anesthesia of skin: Secondary | ICD-10-CM

## 2022-11-20 DIAGNOSIS — J449 Chronic obstructive pulmonary disease, unspecified: Secondary | ICD-10-CM | POA: Diagnosis not present

## 2022-11-20 NOTE — Patient Instructions (Signed)
It was so nice to see you today. Thank you so much for coming in.    You have some wear and tear in your back (arthritis). I think this is causing your back pain, but I'm not sure this is causing the numbness/tingling in your legs.  I want to get an EMG (nerve conduction test) to look into things further. I have ordered this and [facility] will call you to schedule. You can also call them at [phone].   Once I get the results back from the nerve test, we will call you to schedule a phone visit to discuss them.   We can hold on PT for your lower back for now. Let me know if you want to revisit it.   Please do not hesitate to call if you have any questions or concerns. You can also message me in MyChart.   Drake Leach PA-C [phone]     The physicians and staff at [facility] at [facility] are committed to providing excellent care. You may receive a survey asking for feedback about your experience at our office. We value you your feedback and appreciate you taking the time to to fill it out. The [facility] leadership team is also available to discuss your experience in person, feel free to contact us [phone].

## 2022-11-21 ENCOUNTER — Encounter: Payer: Self-pay | Admitting: Neurology

## 2022-11-29 ENCOUNTER — Other Ambulatory Visit: Payer: Self-pay

## 2022-12-02 ENCOUNTER — Encounter: Payer: Self-pay | Admitting: Internal Medicine

## 2022-12-02 ENCOUNTER — Ambulatory Visit: Payer: Medicare HMO | Admitting: Internal Medicine

## 2022-12-02 VITALS — BP 125/73 | HR 119 | Ht 69.0 in | Wt 287.0 lb

## 2022-12-02 DIAGNOSIS — G4700 Insomnia, unspecified: Secondary | ICD-10-CM

## 2022-12-02 DIAGNOSIS — K219 Gastro-esophageal reflux disease without esophagitis: Secondary | ICD-10-CM | POA: Diagnosis not present

## 2022-12-02 DIAGNOSIS — N1831 Chronic kidney disease, stage 3a: Secondary | ICD-10-CM | POA: Diagnosis not present

## 2022-12-02 DIAGNOSIS — I10 Essential (primary) hypertension: Secondary | ICD-10-CM

## 2022-12-02 DIAGNOSIS — Z1211 Encounter for screening for malignant neoplasm of colon: Secondary | ICD-10-CM

## 2022-12-02 DIAGNOSIS — G4733 Obstructive sleep apnea (adult) (pediatric): Secondary | ICD-10-CM

## 2022-12-02 DIAGNOSIS — E118 Type 2 diabetes mellitus with unspecified complications: Secondary | ICD-10-CM

## 2022-12-02 DIAGNOSIS — J4531 Mild persistent asthma with (acute) exacerbation: Secondary | ICD-10-CM

## 2022-12-02 DIAGNOSIS — R2231 Localized swelling, mass and lump, right upper limb: Secondary | ICD-10-CM | POA: Diagnosis not present

## 2022-12-02 DIAGNOSIS — E782 Mixed hyperlipidemia: Secondary | ICD-10-CM

## 2022-12-02 DIAGNOSIS — I5032 Chronic diastolic (congestive) heart failure: Secondary | ICD-10-CM | POA: Diagnosis not present

## 2022-12-02 DIAGNOSIS — Z7984 Long term (current) use of oral hypoglycemic drugs: Secondary | ICD-10-CM

## 2022-12-02 DIAGNOSIS — Z794 Long term (current) use of insulin: Secondary | ICD-10-CM

## 2022-12-02 NOTE — Assessment & Plan Note (Signed)
Lipid panel updated in August.  Total cholesterol 180 and LDL 106.  Atorvastatin was increased to 20 mg daily in light of this result. -Repeat lipid panel at follow-up in 3 months

## 2022-12-02 NOTE — Assessment & Plan Note (Signed)
His acute concern today is a large, fixed mass present under the right axilla and along the right chest wall.   -Soft tissue ultrasound ordered today for better characterization.  Further management pending results.

## 2022-12-02 NOTE — Assessment & Plan Note (Signed)
HFpEF.  Euvolemic on exam today.  Currently prescribed torsemide and alternating doses of 20 and 40 mg daily.  No medication changes are indicated today.

## 2022-12-02 NOTE — Assessment & Plan Note (Signed)
Ambien 5 mg nightly prescribed by pulmonology at his recent appointment.  Continues to endorse insomnia and states he has not started Ambien yet.

## 2022-12-02 NOTE — Assessment & Plan Note (Signed)
Adequately controlled on current antihypertensive regimen consisting of losartan 100 mg daily, nebivolol 5 mg daily, and hydralazine 25 mg twice daily.  No medication changes are indicated today.

## 2022-12-02 NOTE — Assessment & Plan Note (Signed)
Severe OSA.  He endorses nightly compliance with CPAP.  Followed by pulmonology.  Recently seen for follow-up.

## 2022-12-02 NOTE — Assessment & Plan Note (Signed)
CKD 3A based on labs from August.  He is currently prescribed losartan and Farxiga.  No medication changes are indicated today.

## 2022-12-02 NOTE — Patient Instructions (Signed)
It was a pleasure to see you today.  Thank you for giving [place] the opportunity to be involved in your care.  Below is a brief recap of your visit and next steps.  We will plan to see you again in 3 months.  Summary You have established care today No medication changes. No labs ordered Ultrasound ordered to evaluate the mass under your right arm GI referral placed for colonoscopy Follow up in 3 months

## 2022-12-02 NOTE — Progress Notes (Signed)
New Patient Office Visit  Subjective    Patient ID: Bradley Mckenzie., male    DOB: 28-Sep-1965  Age: 57 y.o. MRN: [id number]  CC:  Chief Complaint  Patient presents with   New Patient (Initial Visit)    New pt est care    HPI Bradley Mckenzie. presents to establish care.  He is a 57 year old male with a previously documented past medical history significant for HFpEF, HTN, OSA on CPAP, T2DM, asthmatic bronchitis, insomnia, and chronic lumbar back pain.  Previously followed by [name], PA-C at [facility] family practice.  [name] reports feeling fairly well today.  His acute concern is a mass under his right axilla that has been present for the last year.  He would like to have this further investigated.  He is currently unemployed and on permanent disability.  Denies tobacco, alcohol, and illicit drug use.  His family medical history is significant for CAD, diabetes mellitus, gastric cancer, and additional unspecified cancer.  Acute concerns, chronic medical conditions, and outstanding preventative care items discussed today are individually addressed A/P below.  Outpatient Encounter Medications as of 12/02/2022  Medication Sig   Accu-Chek Softclix Lancets lancets Use to check blood sugar three times daily   albuterol (PROAIR HFA) 108 (90 Base) MCG/ACT inhaler Inhale 2 puffs into the lungs every 6 (six) hours as needed for wheezing or shortness of breath.   atorvastatin (LIPITOR) 20 MG tablet Take 1 tablet (20 mg total) by mouth daily.   benzonatate (TESSALON) 200 MG capsule Take 1 capsule (200 mg total) by mouth 3 (three) times daily as needed for cough.   Blood Glucose Monitoring Suppl (ACCU-CHEK GUIDE) w/Device KIT 1 each by Does not apply route 3 (three) times daily.   Blood Glucose Monitoring Suppl DEVI 1 each by Does not apply route in the morning, at noon, and at bedtime. May substitute to any manufacturer covered by patient's insurance.   Blood Pressure Monitoring (BLOOD  PRESSURE CUFF) MISC USE AS DIRECTED ONCE DAILY AT 12 NOON.   budesonide-formoterol (SYMBICORT) 160-4.5 MCG/ACT inhaler Inhale 2 puffs into the lungs 2 (two) times daily.   cetirizine (ZYRTEC) 10 MG tablet Take 1 tablet (10 mg total) by mouth daily.   dapagliflozin propanediol (FARXIGA) 10 MG TABS tablet Take 10 mg by mouth daily.   famotidine (PEPCID) 20 MG tablet Take 1 tablet (20 mg total) by mouth daily after supper.   glucose blood (ACCU-CHEK GUIDE) test strip Use as instructed   hydrALAZINE (APRESOLINE) 25 MG tablet Take 1 tablet (25 mg total) by mouth 2 (two) times daily.   insulin glargine (LANTUS) 100 UNIT/ML Solostar Pen Inject 31 Units into the skin daily.   Insulin Pen Needle 30G X 5 MM MISC Use as directed daily.   Insulin Pen Needle 32G X 4 MM MISC Use as directed   ipratropium-albuterol (DUONEB) 0.5-2.5 (3) MG/3ML SOLN Take 3 mLs by nebulization every 4 (four) hours as needed.   losartan (COZAAR) 100 MG tablet TAKE ONE TABLET BY MOUTH EVERY DAY   metFORMIN (GLUCOPHAGE) 500 MG tablet Take 1 tablet (500 mg total) by mouth 2 (two) times daily with a meal.   montelukast (SINGULAIR) 10 MG tablet Take 1 tablet (10 mg total) by mouth at bedtime.   nebivolol (BYSTOLIC) 5 MG tablet Take 1 tablet (5 mg total) by mouth daily.   pantoprazole (PROTONIX) 40 MG tablet Take 1 tablet (40 mg total) by mouth daily. Take 30-60 min before first meal of the  day   potassium chloride SA (KLOR-CON M) 20 MEQ tablet TAKE ONE TABLET BY MOUTH 2 TIMES A DAY   promethazine-dextromethorphan (PROMETHAZINE-DM) 6.25-15 MG/5ML syrup Take 5 mLs by mouth 4 (four) times daily as needed for cough.   Rightest GL300 Lancets MISC USE AS DIRECTED.   Semaglutide, 2 MG/DOSE, (OZEMPIC, 2 MG/DOSE,) 8 MG/3ML SOPN Inject 2 mg into the skin once a week.   Tiotropium Bromide Monohydrate (SPIRIVA RESPIMAT) 1.25 MCG/ACT AERS Inhale 2 puffs into the lungs daily.   torsemide (DEMADEX) 20 MG tablet Take 2 tabs daily every other day  ALTERNATING with 1 tab daily every other day   zolpidem (AMBIEN) 5 MG tablet Take 1 tablet (5 mg total) by mouth at bedtime as needed for sleep. (Patient not taking: Reported on 12/02/2022)   No facility-administered encounter medications on file as of 12/02/2022.    Past Medical History:  Diagnosis Date   Allergy    Asthma    Cardiomyopathy (HCC) 2016   a. 01/2014 Echo: EF 25-30%;  b. ? ischemic vs non-ischemic.  He's never had an ischemic eval.   Chronic combined systolic (congestive) and diastolic (congestive) heart failure (HCC)    a. 01/2014 Echo: EF 25-30%. b. Echo 03/2015: Improved EF of 40-45%   COPD (chronic obstructive pulmonary disease) (HCC)    Depression    Diabetes mellitus without complication (HCC) 2018   started med therapy approx 2018   Hyperlipidemia    Hypertension    Hypertensive cardiomyopathy (HCC) 06/30/2015   Hypertensive heart disease    a. Since his 65's.   Morbid obesity (HCC)    Obstructive sleep apnea    a. Has not used CPAP since ~ 2012.   Sleep apnea     Past Surgical History:  Procedure Laterality Date   HERNIA REPAIR  2004    Family History  Problem Relation Age of Onset   Heart disease Mother        alive & well.   Heart attack Father 27       died @ 58 of cancer.   Diabetes Father    Cancer Father        "in his stomach"   Cancer Sister    Cancer Paternal Uncle        strong FH malignancy multiple relavies     Social History   Socioeconomic History   Marital status: Married    Spouse name: Not on file   Number of children: 2   Years of education: 12   Highest education level: GED or equivalent  Occupational History   Occupation: unemployed    Comment: hard time getting disability, denied medicaid   Tobacco Use   Smoking status: Never   Smokeless tobacco: Never   Tobacco comments:    Quit in his 41's.  Vaping Use   Vaping status: Never Used  Substance and Sexual Activity   Alcohol use: Not Currently    Comment:  rarely alcohol use at special events   Drug use: Not Currently    Comment: Used marijuana in his teens.   Sexual activity: Not Currently  Other Topics Concern   Not on file  Social History Narrative   Lives in [place] with his wife. Wife no longer working; now on disability. Have stable housing at group home that his wife used to work at. Sometimes struggles with transportation but not desperate. Wife has working car, his doesn't work. Had food stamps but cut off after 3 months. Wasn't eligible  after that bc not able to work. Has been denied twice from disability. He was not able to obtain medicaid   Social Determinants of Health   Financial Resource Strain: Low Risk  (11/28/2021)   Overall Financial Resource Strain (CARDIA)    Difficulty of Paying Living Expenses: Not hard at all  Food Insecurity: No Food Insecurity (05/17/2021)   Hunger Vital Sign    Worried About Running Out of Food in the Last Year: Never true    Ran Out of Food in the Last Year: Never true  Transportation Needs: No Transportation Needs (05/17/2021)   PRAPARE - Administrator, Civil Service (Medical): No    Lack of Transportation (Non-Medical): No  Physical Activity: Inactive (11/28/2021)   Exercise Vital Sign    Days of Exercise per Week: 0 days    Minutes of Exercise per Session: 0 min  Stress: No Stress Concern Present (11/28/2021)   [employer] of Occupational Health - Occupational Stress Questionnaire    Feeling of Stress : Only a little  Social Connections: Moderately Integrated (11/28/2021)   Social Connection and Isolation Panel [NHANES]    Frequency of Communication with Friends and Family: More than three times a week    Frequency of Social Gatherings with Friends and Family: Once a week    Attends Religious Services: 1 to 4 times per year    Active Member of [employer] or Organizations: No    Attends Banker Meetings: Never    Marital Status: Married  Catering manager  Violence: Not At Risk (11/28/2021)   Humiliation, Afraid, Rape, and Kick questionnaire    Fear of Current or Ex-Partner: No    Emotionally Abused: No    Physically Abused: No    Sexually Abused: No   Review of Systems  Constitutional:  Negative for chills and fever.  HENT:  Negative for sore throat.   Respiratory:  Negative for cough and shortness of breath.   Cardiovascular:  Negative for chest pain, palpitations and leg swelling.  Gastrointestinal:  Negative for abdominal pain, blood in stool, constipation, diarrhea, nausea and vomiting.  Genitourinary:  Negative for dysuria and hematuria.  Musculoskeletal:  Negative for myalgias.  Skin:  Negative for itching and rash.       R axilla mass  Neurological:  Negative for dizziness and headaches.  Psychiatric/Behavioral:  Negative for depression and suicidal ideas.     Objective    BP 125/73 (BP Location: Right Arm, Patient Position: Sitting, Cuff Size: Large)   Pulse (!) 119   Ht 5\' 9"  (1.753 m)   Wt 287 lb (130.2 kg)   SpO2 93%   BMI 42.38 kg/m   Physical Exam Vitals reviewed.  Constitutional:      General: He is not in acute distress.    Appearance: Normal appearance. He is obese. He is not ill-appearing.  HENT:     Head: Normocephalic and atraumatic.     Right Ear: External ear normal.     Left Ear: External ear normal.     Nose: Nose normal. No congestion or rhinorrhea.     Mouth/Throat:     Mouth: Mucous membranes are moist.     Pharynx: Oropharynx is clear.  Eyes:     General: No scleral icterus.    Extraocular Movements: Extraocular movements intact.     Conjunctiva/sclera: Conjunctivae normal.     Pupils: Pupils are equal, round, and reactive to light.  Cardiovascular:     Rate and Rhythm:  Normal rate and regular rhythm.     Pulses: Normal pulses.     Heart sounds: Normal heart sounds. No murmur heard. Pulmonary:     Effort: Pulmonary effort is normal.     Breath sounds: Normal breath sounds. No wheezing,  rhonchi or rales.  Abdominal:     General: Abdomen is flat. Bowel sounds are normal. There is no distension.     Palpations: Abdomen is soft.     Tenderness: There is no abdominal tenderness.  Musculoskeletal:        General: No swelling or deformity. Normal range of motion.     Cervical back: Normal range of motion.  Skin:    General: Skin is warm and dry.     Capillary Refill: Capillary refill takes less than 2 seconds.     Findings: Lesion (There is a large, fixed mass under the right axilla. Non-tender, no overlying erythema.) present.  Neurological:     General: No focal deficit present.     Mental Status: He is alert and oriented to person, place, and time.     Motor: No weakness.  Psychiatric:        Mood and Affect: Mood normal.        Behavior: Behavior normal.        Thought Content: Thought content normal.   Last CBC Lab Results  Component Value Date   WBC 9.9 11/08/2022   HGB 14.2 11/08/2022   HCT 44.8 11/08/2022   MCV 85 11/08/2022   MCH 27.0 11/08/2022   RDW 14.5 11/08/2022   PLT 235 11/08/2022   Last metabolic panel Lab Results  Component Value Date   GLUCOSE 102 (H) 08/30/2022   NA 143 08/30/2022   K 4.2 08/30/2022   CL 103 08/30/2022   CO2 25 08/30/2022   BUN 11 08/30/2022   CREATININE 1.47 (H) 08/30/2022   EGFR 56 (L) 08/30/2022   CALCIUM 9.8 08/30/2022   PHOS 6.0 (H) 09/02/2015   PROT 8.0 08/30/2022   ALBUMIN 4.6 08/30/2022   LABGLOB 3.4 08/30/2022   AGRATIO 1.4 04/18/2022   BILITOT 0.2 08/30/2022   ALKPHOS 116 08/30/2022   AST 20 08/30/2022   ALT 33 08/30/2022   ANIONGAP 8 12/29/2020   Last lipids Lab Results  Component Value Date   CHOL 180 08/30/2022   HDL 56 08/30/2022   LDLCALC 106 (H) 08/30/2022   TRIG 97 08/30/2022   CHOLHDL 3.2 08/30/2022   Last hemoglobin A1c Lab Results  Component Value Date   HGBA1C 7.8 (A) 08/30/2022   Last thyroid functions Lab Results  Component Value Date   TSH 1.140 11/22/2020   Last vitamin  D Lab Results  Component Value Date   VD25OH 32.6 11/14/2021   Last vitamin B12 and Folate Lab Results  Component Value Date   VITAMINB12 297 04/20/2020   Assessment & Plan:   Problem List Items Addressed This Visit       Chronic diastolic heart failure (HCC) (Chronic)    HFpEF.  Euvolemic on exam today.  Currently prescribed torsemide and alternating doses of 20 and 40 mg daily.  No medication changes are indicated today.      Essential hypertension (Chronic)    Adequately controlled on current antihypertensive regimen consisting of losartan 100 mg daily, nebivolol 5 mg daily, and hydralazine 25 mg twice daily.  No medication changes are indicated today.      OSA on CPAP (Chronic)    Severe OSA.  He endorses nightly compliance  with CPAP.  Followed by pulmonology.  Recently seen for follow-up.      Asthmatic bronchitis    Followed by pulmonology.  He is asymptomatic currently.  Pulmonary exam is unremarkable.  He is prescribed Symbicort, Spiriva, and Singulair.      GERD (gastroesophageal reflux disease)    Symptoms are adequately controlled with daily use of Protonix and Pepcid.      Type 2 diabetes mellitus with complication (HCC) (Chronic)    A1c 7.8 on labs from April.  He is currently prescribed Ozempic 2 mg weekly, Lantus 31 units nightly, metformin 500 mg twice daily, and Farxiga 10 mg daily.   -No medication changes are indicated today.  Repeat A1c at follow-up in 3 months.      CKD stage 3a, GFR 45-59 ml/min (HCC)    CKD 3A based on labs from August.  He is currently prescribed losartan and Farxiga.  No medication changes are indicated today.      Mixed hyperlipidemia (Chronic)    Lipid panel updated in August.  Total cholesterol 180 and LDL 106.  Atorvastatin was increased to 20 mg daily in light of this result. -Repeat lipid panel at follow-up in 3 months      Insomnia    Ambien 5 mg nightly prescribed by pulmonology at his recent appointment.  Continues  to endorse insomnia and states he has not started Ambien yet.      Colon cancer screening - Primary    Gastroenterology referral placed today for screening colonoscopy.      Mass of right axilla    His acute concern today is a large, fixed mass present under the right axilla and along the right chest wall.   -Soft tissue ultrasound ordered today for better characterization.  Further management pending results.       Return in about 3 months (around 03/04/2023).   Billie Lade, MD

## 2022-12-02 NOTE — Assessment & Plan Note (Signed)
Followed by pulmonology.  He is asymptomatic currently.  Pulmonary exam is unremarkable.  He is prescribed Symbicort, Spiriva, and Singulair.

## 2022-12-02 NOTE — Assessment & Plan Note (Signed)
A1c 7.8 on labs from April.  He is currently prescribed Ozempic 2 mg weekly, Lantus 31 units nightly, metformin 500 mg twice daily, and Farxiga 10 mg daily.   -No medication changes are indicated today.  Repeat A1c at follow-up in 3 months.

## 2022-12-02 NOTE — Assessment & Plan Note (Signed)
Gastroenterology referral placed today for screening colonoscopy. 

## 2022-12-02 NOTE — Assessment & Plan Note (Signed)
Symptoms are adequately controlled with daily use of Protonix and Pepcid.

## 2022-12-03 ENCOUNTER — Other Ambulatory Visit: Payer: Self-pay

## 2022-12-03 ENCOUNTER — Encounter: Payer: Self-pay | Admitting: *Deleted

## 2022-12-05 ENCOUNTER — Other Ambulatory Visit: Payer: Self-pay | Admitting: Internal Medicine

## 2022-12-05 ENCOUNTER — Ambulatory Visit (HOSPITAL_COMMUNITY)
Admission: RE | Admit: 2022-12-05 | Discharge: 2022-12-05 | Disposition: A | Discharge status: Home / Self Care | Payer: Medicare HMO | Source: Ambulatory Visit | Attending: Internal Medicine | Admitting: Internal Medicine

## 2022-12-05 ENCOUNTER — Other Ambulatory Visit: Payer: Self-pay

## 2022-12-05 DIAGNOSIS — K219 Gastro-esophageal reflux disease without esophagitis: Secondary | ICD-10-CM

## 2022-12-05 DIAGNOSIS — E782 Mixed hyperlipidemia: Secondary | ICD-10-CM

## 2022-12-05 DIAGNOSIS — I5032 Chronic diastolic (congestive) heart failure: Secondary | ICD-10-CM

## 2022-12-05 DIAGNOSIS — J4531 Mild persistent asthma with (acute) exacerbation: Secondary | ICD-10-CM

## 2022-12-05 DIAGNOSIS — Z1211 Encounter for screening for malignant neoplasm of colon: Secondary | ICD-10-CM

## 2022-12-05 DIAGNOSIS — R2231 Localized swelling, mass and lump, right upper limb: Secondary | ICD-10-CM

## 2022-12-05 DIAGNOSIS — E118 Type 2 diabetes mellitus with unspecified complications: Secondary | ICD-10-CM

## 2022-12-05 DIAGNOSIS — N1831 Chronic kidney disease, stage 3a: Secondary | ICD-10-CM

## 2022-12-05 DIAGNOSIS — I10 Essential (primary) hypertension: Secondary | ICD-10-CM

## 2022-12-05 DIAGNOSIS — R202 Paresthesia of skin: Secondary | ICD-10-CM

## 2022-12-05 DIAGNOSIS — G4700 Insomnia, unspecified: Secondary | ICD-10-CM

## 2022-12-05 DIAGNOSIS — G4733 Obstructive sleep apnea (adult) (pediatric): Secondary | ICD-10-CM

## 2022-12-16 ENCOUNTER — Encounter (INDEPENDENT_AMBULATORY_CARE_PROVIDER_SITE_OTHER): Payer: Self-pay | Admitting: *Deleted

## 2022-12-16 ENCOUNTER — Other Ambulatory Visit: Payer: Self-pay

## 2022-12-16 ENCOUNTER — Other Ambulatory Visit: Payer: Self-pay | Admitting: Internal Medicine

## 2022-12-16 DIAGNOSIS — R2231 Localized swelling, mass and lump, right upper limb: Secondary | ICD-10-CM

## 2022-12-17 ENCOUNTER — Ambulatory Visit: Payer: Medicare HMO | Admitting: Neurology

## 2022-12-17 DIAGNOSIS — R202 Paresthesia of skin: Secondary | ICD-10-CM

## 2022-12-17 DIAGNOSIS — G629 Polyneuropathy, unspecified: Secondary | ICD-10-CM

## 2022-12-17 NOTE — Procedures (Signed)
[facility]  [address] [place], [address]  [place], [place] [address] Tel: [phone] Fax: [phone] Test Date:  12/17/2022  Patient: Bradley Mckenzie DOB: [birth year] Physician: Jacquelyne Balint, MD  Sex: Male Height: 5\' 9"  Ref Phys: Drake Leach, PA-C  ID#: [id number]   Technician:    History: This is a 57 year old male with numbness and tingling in bilateral lower limbs.  NCV & EMG Findings: Extensive electrodiagnostic evaluation of bilateral lower limbs shows: Bilateral sural and superficial peroneal/fibular sensory responses are absent. Bilateral peroneal/fibular (EDB) and tibial (AH) motor responses are absent. Bilateral peroneal/fibular (TA) motor responses are within normal limits. Bilateral H reflexes are absent. Chronic motor axon loss changes without accompanying active denervation changes are seen in bilateral tibialis anterior, medial head of gastrocnemius, and flexor digitorum longus muscles.  Impression: This is an abnormal study. 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 No definitive electrodiagnostic evidence of a left or right lumbosacral (L3-S1) motor radiculopathy.    ___________________________ Jacquelyne Balint, MD    Nerve Conduction Studies Motor Nerve Results    Latency Amplitude F-Lat Segment Distance CV Comment  Site (ms) Norm (mV) Norm (ms)  (cm) (m/s) Norm   Left Fibular (EDB) Motor  Ankle *NR  < 6.0 *NR  > 2.5        Bel fib head *NR - *NR -  Bel fib head-Ankle - *NR  > 40   Pop fossa *NR - *NR -  Pop fossa-Bel fib head - *NR -   Right Fibular (EDB) Motor  Ankle *NR  < 6.0 *NR  > 2.5        Bel fib head *NR - *NR -  Bel fib head-Ankle - *NR  > 40   Pop fossa *NR - *NR -  Pop fossa-Bel fib head - *NR -   Left Fibular (TA) Motor  Fib head 3.0  < 4.5 5.0  > 3.0        Pop fossa 5.3  < 6.7 5.0 -  Pop fossa-Fib head 10 43   > 40   Right Fibular (TA) Motor  Fib head 2.9  < 4.5 5.3  > 3.0        Pop fossa 4.7  < 6.7 5.2 -  Pop fossa-Fib head 10 56  > 40   Left Tibial (AH) Motor  Ankle *NR  < 6.0 *NR  > 4.0        Knee *NR - *NR -  Knee-Ankle - *NR  > 40   Right Tibial (AH) Motor  Ankle *NR  < 6.0 *NR  > 4.0        Knee *NR - *NR -  Knee-Ankle - *NR  > 40    Sensory Sites    Neg Peak Lat Amplitude (O-P) Segment Distance Velocity Comment  Site (ms) Norm (V) Norm  (cm) (ms)   Left Superficial Fibular Sensory  14 cm-Ankle *NR  < 4.6 *NR  > 4 14 cm-Ankle 14    Right Superficial Fibular Sensory  14 cm-Ankle *NR  < 4.6 *NR  > 4 14 cm-Ankle 14    Left Sural Sensory  Calf-Lat mall *NR  < 4.6 *NR  > 4 Calf-Lat mall 14    Right Sural Sensory  Calf-Lat mall *NR  < 4.6 *NR  > 4 Calf-Lat mall 14     H-Reflex Results  M-Lat H Lat H Neg Amp H-M Lat  Site (ms) (ms) Norm (mV) (ms)  Left Tibial H-Reflex  Pop fossa 6.7 -  < 35.0 - -  Right Tibial H-Reflex  Pop fossa 6.4 -  < 35.0 - -   Electromyography   Side Muscle Ins.Act Fibs Fasc Recrt Amp Dur Poly Activation Comment  Left Tib ant Nml Nml Nml *1- *1+ *1+ Nml Nml N/A  Left Gastroc MH Nml Nml Nml *1- *1+ *1+ Nml Nml N/A  Left FDL Nml Nml Nml *2- *1+ *1+ Nml Nml N/A  Left Rectus fem Nml Nml Nml Nml Nml Nml Nml Nml N/A  Left Biceps fem SH Nml Nml Nml Nml Nml Nml Nml Nml N/A  Left Gluteus med Nml Nml Nml Nml Nml Nml Nml Nml N/A  Right Tib ant Nml Nml Nml *1- *1+ *1+ Nml Nml N/A  Left Lumbar PSP lower Nml Nml Nml Nml Nml Nml Nml Nml N/A  Right Gastroc MH Nml Nml Nml *1- *1+ *1+ Nml Nml N/A  Right FDL Nml Nml Nml *1- *1+ *1+ Nml Nml N/A  Right Rectus fem Nml Nml Nml Nml Nml Nml Nml Nml N/A  Right Biceps fem SH Nml Nml Nml Nml Nml Nml Nml Nml N/A  Right Gluteus med Nml Nml Nml Nml Nml Nml Nml Nml N/A  Right Lumbar PSP lower Nml Nml Nml Nml Nml Nml Nml Nml N/A      Waveforms:  Motor               Sensory           H-Reflex

## 2022-12-19 ENCOUNTER — Other Ambulatory Visit: Payer: Self-pay

## 2022-12-20 ENCOUNTER — Telehealth: Payer: Self-pay

## 2022-12-20 NOTE — Progress Notes (Unsigned)
   Telephone Visit- Progress Note: Referring Physician:  Billie Lade, MD [address] [place],  [place] [address]  Primary Physician:  Billie Lade, MD  This visit was performed via telephone.  Patient location: home Provider location: office  I spent a total of 10 minutes non-face-to-face activities for this visit on the date of this encounter including review of current clinical condition and response to treatment.    Patient has given verbal consent to this telephone visits and we reviewed the limitations of a telephone visit. Patient wishes to proceed.    Chief Complaint:  review EMG results  History of Present Illness: Bradley Mckenzie. is a 57 y.o. male has a history of heart failure, HTN, hypertensive cardiomyopathy, OSA, COPD, CKD 2, and DM.   He has known lumbar spondylosis L3-S1 with DDD. He has mild lateral recess stenosis bilaterally at L3-L4 and L4-L5 with mild lateral recess stenosis on right at L5-S1. No significant compression noted.   At last visit, his primary complaint was numbness in left heel.   Phone visit to review his EMG results.   He continues with constant numbness in left heel along with intermittent numbness/tingling in both legs that is more in posterior thighs. He still has intermittent LBP as well.    He is taking prn motrin with some relief.    No bowel or bladder issues.      Conservative measures:  Physical therapy: he did PT in 1992 and 2019 Multimodal medical therapy including regular antiinflammatories: mobic, robaxin, motrin, naprosyn  Injections: No epidural steroid injections   Past Surgery: No spinal surgery.   Exam: No exam done as this was a telephone encounter.     Imaging: EMG of bilateral lower extremities dated 12/17/22:  Impression: This is an abnormal study. 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 No definitive electrodiagnostic evidence of a left or right lumbosacral (L3-S1) motor radiculopathy.   ___________________________ Bradley Balint, MD  I have personally reviewed the images and agree with the above interpretation.  Assessment and Plan: Bradley Mckenzie is a pleasant 58 y.o. male with a history of chronic LBP x years.     Numbness in his left heel is his primary complaint along with intermittent numbness/tingling in both legs (more in posterior thigh). He also has intermittent LBP.    He has known lumbar spondylosis L3-S1 with DDD. He has mild lateral recess stenosis bilaterally at L3-L4 and L4-L5 with mild lateral recess stenosis on right at L5-S1. No significant compression noted.   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 No lumbar radiculopathy.    LBP is likely due to underlying spondylosis and DDD. Numbness and tingling in legs is likely from neuropathy.    Treatment options discussed with patient and following plan made:    - Referral back to neurology [facility]) for further workup and treatment of neuropathy.  - PT recommended for his lower back. He declines for now. I sent him a lumbar HEP and he will do this daily.  - Discussed lumbar injections and he declines as well (hates needles).  - Follow up with me in 2-3 months and prn.   Drake Leach PA-C Neurosurgery

## 2022-12-20 NOTE — Telephone Encounter (Signed)
Left message to return call 

## 2022-12-20 NOTE — Telephone Encounter (Signed)
Copied from CRM [id number]. Topic: Clinical - Request for Lab/Test Order >> Dec 20, 2022 11:11 AM Amy B wrote: Reason for CRM: Patient has a CT CHEST W CONTRAST scheduled 12/9.  He declines contrast and requests the order be changed to WITHOUT contrast.

## 2022-12-23 ENCOUNTER — Encounter (HOSPITAL_COMMUNITY): Payer: Self-pay

## 2022-12-23 ENCOUNTER — Encounter: Payer: Medicare HMO | Attending: Physician Assistant | Admitting: Dietician

## 2022-12-23 ENCOUNTER — Encounter: Payer: Self-pay | Admitting: Dietician

## 2022-12-23 ENCOUNTER — Other Ambulatory Visit: Payer: Self-pay | Admitting: Internal Medicine

## 2022-12-23 ENCOUNTER — Ambulatory Visit (HOSPITAL_COMMUNITY)
Admission: RE | Admit: 2022-12-23 | Discharge: 2022-12-23 | Disposition: A | Discharge status: Home / Self Care | Payer: Medicare HMO | Source: Ambulatory Visit | Attending: Internal Medicine | Admitting: Internal Medicine

## 2022-12-23 DIAGNOSIS — R2231 Localized swelling, mass and lump, right upper limb: Secondary | ICD-10-CM

## 2022-12-23 DIAGNOSIS — E118 Type 2 diabetes mellitus with unspecified complications: Secondary | ICD-10-CM | POA: Diagnosis not present

## 2022-12-23 NOTE — Progress Notes (Signed)
Diabetes Self-Management Education  Visit Type: Follow-up  Appt. Start Time: 0815 Appt. End Time: 0910  12/23/2022  [name], identified by name and date of birth, is a 57 y.o. male with a diagnosis of Diabetes:  .   ASSESSMENT  There were no vitals taken for this visit. There is no height or weight on file to calculate BMI.  Pt reports continuing to get higher readings on AccuChek compared to previous meter (Bionime GM550), unable to get test strips for old meter. Sample Meter Given: AccuChek Guide Me S/N: [id number] Lot #: [id number] Exp Date: 08/02/2023 Pt reports no DM medication changes, may have to switch SGLT-2 inhibitor r/t change in insurance. Pt reports starting Spiriva for asthmatic bronchitis states it has not helped them with SOB, reports continuing increase in DOE (wheezing/coughing) and has not been able to walk as much as before. Pt reports working trying to build better meals with appropriate portions, still has suppressed appetite r/t Ozempic. Pt reports getting a new CPAP and states they are not sleeping as well since, might get 5-6 hours.   Diabetes Self-Management Education - 12/23/22 0947       Visit Information   Visit Type Follow-up      Dietary Intake   Breakfast 1/4 club sandwich, orange juice, 2% milk    Lunch Ribs/chicken finger/shrimp, lemonade    Dinner Ham and cheese sandwich, sugar free Kool-Aid    Snack (evening) Doritos    Beverage(s) OJ, Milk, Lemonade, Kool-Aid      Activity / Exercise   Activity / Exercise Type ADL's   Increased DOE, unable to exercise     Individualized Goals (developed by patient)   Nutrition Follow meal plan discussed;General guidelines for healthy choices and portions discussed   Low sodium   Medications take my medication as prescribed    Monitoring  Test my blood glucose as discussed;Other (comment)   Compare readings on old glucometer to new sample   Problem Solving Sleep Pattern;Addressing barriers to  behavior change    Reducing Risk Other (comment)   Low sodium for CHF     Patient Self-Evaluation of Goals - Patient rates self as meeting previously set goals (% of time)   Nutrition 25 - 50% (sometimes)    Physical Activity < 25% (hardly ever/never)    Medications >75% (most of the time)    Monitoring 50 - 75 % (half of the time)    Problem Solving and behavior change strategies  25 - 50% (sometimes)    Reducing Risk (treating acute and chronic complications) 25 - 50% (sometimes)    Health Coping 25 - 50% (sometimes)      Post-Education Assessment   Patient understands the diabetes disease and treatment process. Comprehends key points    Patient understands incorporating nutritional management into lifestyle. Needs Review    Patient undertands incorporating physical activity into lifestyle. Comprehends key points   Limited by DOE   Patient understands using medications safely. Comphrehends key points    Patient understands monitoring blood glucose, interpreting and using results Comprehends key points    Patient understands prevention, detection, and treatment of acute complications. Comprehends key points    Patient understands prevention, detection, and treatment of chronic complications. Comprehends key points    Patient understands how to develop strategies to address psychosocial issues. Comprehends key points    Patient understands how to develop strategies to promote health/change behavior. Needs Review      Outcomes   Expected Outcomes Demonstrated  interest in learning but significant barriers to change    Future DMSE 3-4 months    Program Status Not Completed      Subsequent Visit   Since your last visit have you continued or begun to take your medications as prescribed? Yes    Since your last visit have you had your blood pressure checked? Yes    Is your most recent blood pressure lower, unchanged, or higher since your last visit? Lower    Since your last visit have you  experienced any weight changes? Gain    Weight Gain (lbs) 4    Since your last visit, are you checking your blood glucose at least once a day? Yes             Individualized Plan for Diabetes Self-Management Training:   Learning Objective:  Patient will have a greater understanding of diabetes self-management. Patient education plan is to attend individual and/or group sessions per assessed needs and concerns.   Plan:   Patient Instructions  When choosing packaged or processed foods, ALWAYS look for the lowest sodium options possible. Choose low sodium popcorn (Chika [place], [place]) in place of potato/tortilla chips. Choose low sodium deli meats if you are having sandwiches.  When going out to eat, look online for the nutrition information for wherever you go to eat to compare entrees and choose lower sodium options.  Choose low-salt or salt free seasonings like Mrs. Dash, or dried herbs/spices.  Check your blood sugar each morning before eating or drinking (fasting). Look for numbers under 130 mg/dL.     Expected Outcomes:  Demonstrated interest in learning but significant barriers to change  Education material provided: Heart Failure Nutrition therapy  If problems or questions, patient to contact team via:  Phone and Email  Future DSME appointment: 3-4 months

## 2022-12-23 NOTE — Patient Instructions (Addendum)
When choosing packaged or processed foods, ALWAYS look for the lowest sodium options possible. Choose low sodium popcorn (Chika [place], [place]) in place of potato/tortilla chips. Choose low sodium deli meats if you are having sandwiches.  When going out to eat, look online for the nutrition information for wherever you go to eat to compare entrees and choose lower sodium options.  Choose low-salt or salt free seasonings like Mrs. Dash, or dried herbs/spices.  Check your blood sugar each morning before eating or drinking (fasting). Look for numbers under 130 mg/dL.

## 2022-12-24 ENCOUNTER — Ambulatory Visit: Payer: Medicare HMO | Admitting: Orthopedic Surgery

## 2022-12-24 ENCOUNTER — Encounter: Payer: Self-pay | Admitting: Orthopedic Surgery

## 2022-12-24 ENCOUNTER — Ambulatory Visit (HOSPITAL_COMMUNITY)
Admission: RE | Admit: 2022-12-24 | Discharge: 2022-12-24 | Disposition: A | Discharge status: Home / Self Care | Payer: Medicare HMO | Source: Ambulatory Visit | Attending: Internal Medicine | Admitting: Internal Medicine

## 2022-12-24 DIAGNOSIS — G629 Polyneuropathy, unspecified: Secondary | ICD-10-CM | POA: Diagnosis not present

## 2022-12-24 DIAGNOSIS — R911 Solitary pulmonary nodule: Secondary | ICD-10-CM | POA: Insufficient documentation

## 2022-12-24 DIAGNOSIS — R2231 Localized swelling, mass and lump, right upper limb: Secondary | ICD-10-CM | POA: Insufficient documentation

## 2022-12-24 DIAGNOSIS — M47816 Spondylosis without myelopathy or radiculopathy, lumbar region: Secondary | ICD-10-CM | POA: Diagnosis not present

## 2022-12-24 DIAGNOSIS — R918 Other nonspecific abnormal finding of lung field: Secondary | ICD-10-CM | POA: Diagnosis not present

## 2022-12-24 DIAGNOSIS — M5136 Other intervertebral disc degeneration, lumbar region with discogenic back pain only: Secondary | ICD-10-CM

## 2022-12-27 ENCOUNTER — Other Ambulatory Visit (HOSPITAL_BASED_OUTPATIENT_CLINIC_OR_DEPARTMENT_OTHER): Payer: Self-pay

## 2022-12-30 ENCOUNTER — Telehealth: Payer: Self-pay | Admitting: Family

## 2022-12-30 NOTE — Telephone Encounter (Signed)
Pt confirmed appt for 12/31/22

## 2022-12-31 ENCOUNTER — Other Ambulatory Visit: Payer: Self-pay

## 2022-12-31 ENCOUNTER — Encounter: Payer: Self-pay | Admitting: Family

## 2022-12-31 ENCOUNTER — Other Ambulatory Visit
Admission: RE | Admit: 2022-12-31 | Discharge: 2022-12-31 | Disposition: A | Discharge status: Home / Self Care | Payer: Medicare HMO | Source: Ambulatory Visit | Attending: Family | Admitting: Family

## 2022-12-31 ENCOUNTER — Ambulatory Visit (HOSPITAL_BASED_OUTPATIENT_CLINIC_OR_DEPARTMENT_OTHER): Payer: Medicare HMO | Admitting: Family

## 2022-12-31 ENCOUNTER — Telehealth: Payer: Self-pay

## 2022-12-31 VITALS — BP 134/82 | HR 112 | Wt 282.0 lb

## 2022-12-31 DIAGNOSIS — G4733 Obstructive sleep apnea (adult) (pediatric): Secondary | ICD-10-CM | POA: Diagnosis not present

## 2022-12-31 DIAGNOSIS — M47816 Spondylosis without myelopathy or radiculopathy, lumbar region: Secondary | ICD-10-CM

## 2022-12-31 DIAGNOSIS — Z794 Long term (current) use of insulin: Secondary | ICD-10-CM | POA: Diagnosis not present

## 2022-12-31 DIAGNOSIS — I10 Essential (primary) hypertension: Secondary | ICD-10-CM

## 2022-12-31 DIAGNOSIS — I5032 Chronic diastolic (congestive) heart failure: Secondary | ICD-10-CM | POA: Insufficient documentation

## 2022-12-31 DIAGNOSIS — Z79899 Other long term (current) drug therapy: Secondary | ICD-10-CM | POA: Insufficient documentation

## 2022-12-31 DIAGNOSIS — R000 Tachycardia, unspecified: Secondary | ICD-10-CM

## 2022-12-31 DIAGNOSIS — I130 Hypertensive heart and chronic kidney disease with heart failure and stage 1 through stage 4 chronic kidney disease, or unspecified chronic kidney disease: Secondary | ICD-10-CM | POA: Insufficient documentation

## 2022-12-31 DIAGNOSIS — I428 Other cardiomyopathies: Secondary | ICD-10-CM | POA: Diagnosis not present

## 2022-12-31 DIAGNOSIS — E119 Type 2 diabetes mellitus without complications: Secondary | ICD-10-CM | POA: Diagnosis not present

## 2022-12-31 LAB — BASIC METABOLIC PANEL
Anion gap: 11 (ref 5–15)
BUN: 15 mg/dL (ref 6–20)
CO2: 27 mmol/L (ref 22–32)
Calcium: 9.5 mg/dL (ref 8.9–10.3)
Chloride: 103 mmol/L (ref 98–111)
Creatinine, Ser: 1.83 mg/dL — ABNORMAL HIGH (ref 0.61–1.24)
GFR, Estimated: 43 mL/min — ABNORMAL LOW (ref >60)
Glucose, Bld: 131 mg/dL — ABNORMAL HIGH (ref 70–99)
Potassium: 4 mmol/L (ref 3.5–5.1)
Sodium: 141 mmol/L (ref 135–145)

## 2022-12-31 LAB — HEMOGLOBIN A1C
Hgb A1c MFr Bld: 9 % — ABNORMAL HIGH (ref 4.8–5.6)
Mean Plasma Glucose: 211.6 mg/dL

## 2022-12-31 LAB — LIPID PANEL
Cholesterol: 144 mg/dL (ref 0–200)
HDL: 44 mg/dL (ref >40)
LDL Cholesterol: 83 mg/dL (ref 0–99)
Total CHOL/HDL Ratio: 3.3 {ratio}
Triglycerides: 87 mg/dL (ref <150)
VLDL: 17 mg/dL (ref 0–40)

## 2022-12-31 MED ORDER — TORSEMIDE 20 MG PO TABS
20.0000 mg | ORAL_TABLET | Freq: Every day | ORAL | 3 refills | Status: DC
  Filled 2022-12-31: qty 30, 15d supply, fill #0

## 2022-12-31 NOTE — Progress Notes (Addendum)
PCP: [name], PA (last seen 11/24) Primary Cardiologist: none  HPI:  Bradley Mckenzie is a 57 y/o male who has a history of diabetes, HTN, lumbar spondylosis, obstructive sleep apnea, depression, hyperlipidemia, asthma, CKD, morbid obesity and heart failure with a reduced ejection fraction. Remote tobacco exposure.   Was in the ED 12/09/21 due to left foot numbness. Was in the ED 12/18/21 due to LBP radiating down left leg.  Echo 03/27/15: EF 40-45% with mild MR Echo 09/02/15: EF of 30-35%, mild mitral regurgitation and no aortic stenosis. Echo 10/28/2018: EF of 60-65% along with mildly elevated PA pressure.  Echo 09/23/22: EF 55-60% with moderate LVH, Grade I DD  Returns today for a HF follow-up visit with a chief complaint of moderate fatigue with minimal exertion. Chronic in nature. Has associated shortness of breath, cough, occasional palpitations and dizziness with sudden position changes along with this. Denies chest pain, abdominal distention, pedal edema or weight gain. Continues to have issues with falling asleep. Does feel like he's able to walk more than he used to.   Still having issues with CPAP with running out of water overnight, sometimes running out after 5 hours.   Fasting glucose running ~ 170's. Has not taken any of his medications yet today. Is switching insurance the first of the year and may have to switch from farxiga -> jardiance due to coverage.   ROS: All systems negative except as listed in HPI, PMH and Problem List.  SH:  Social History   Socioeconomic History   Marital status: Married    Spouse name: Not on file   Number of children: 2   Years of education: 12   Highest education level: GED or equivalent  Occupational History   Occupation: unemployed    Comment: hard time getting disability, denied medicaid   Tobacco Use   Smoking status: Never   Smokeless tobacco: Never   Tobacco comments:    Quit in his 69's.  Vaping Use   Vaping status: Never Used   Substance and Sexual Activity   Alcohol use: Not Currently    Comment: rarely alcohol use at special events   Drug use: Not Currently    Comment: Used marijuana in his teens.   Sexual activity: Not Currently  Other Topics Concern   Not on file  Social History Narrative   Lives in [place] with his wife. Wife no longer working; now on disability. Have stable housing at group home that his wife used to work at. Sometimes struggles with transportation but not desperate. Wife has working car, his doesn't work. Had food stamps but cut off after 3 months. Wasn't eligible after that bc not able to work. Has been denied twice from disability. He was not able to obtain medicaid   Social Drivers of Health   Financial Resource Strain: Low Risk  (11/28/2021)   Overall Financial Resource Strain (CARDIA)    Difficulty of Paying Living Expenses: Not hard at all  Food Insecurity: No Food Insecurity (05/17/2021)   Hunger Vital Sign    Worried About Running Out of Food in the Last Year: Never true    Ran Out of Food in the Last Year: Never true  Transportation Needs: No Transportation Needs (05/17/2021)   PRAPARE - Administrator, Civil Service (Medical): No    Lack of Transportation (Non-Medical): No  Physical Activity: Inactive (11/28/2021)   Exercise Vital Sign    Days of Exercise per Week: 0 days    Minutes of  Exercise per Session: 0 min  Stress: No Stress Concern Present (11/28/2021)   [employer] of Occupational Health - Occupational Stress Questionnaire    Feeling of Stress : Only a little  Social Connections: Moderately Integrated (11/28/2021)   Social Connection and Isolation Panel [NHANES]    Frequency of Communication with Friends and Family: More than three times a week    Frequency of Social Gatherings with Friends and Family: Once a week    Attends Religious Services: 1 to 4 times per year    Active Member of [employer] or Organizations: No    Attends Banker  Meetings: Never    Marital Status: Married  Catering manager Violence: Not At Risk (11/28/2021)   Humiliation, Afraid, Rape, and Kick questionnaire    Fear of Current or Ex-Partner: No    Emotionally Abused: No    Physically Abused: No    Sexually Abused: No    FH:  Family History  Problem Relation Age of Onset   Heart disease Mother        alive & well.   Heart attack Father 74       died @ 76 of cancer.   Diabetes Father    Cancer Father        "in his stomach"   Cancer Sister    Cancer Paternal Uncle        strong FH malignancy multiple relavies     Past Medical History:  Diagnosis Date   Allergy    Asthma    Cardiomyopathy (HCC) 2016   a. 01/2014 Echo: EF 25-30%;  b. ? ischemic vs non-ischemic.  He's never had an ischemic eval.   Chronic combined systolic (congestive) and diastolic (congestive) heart failure (HCC)    a. 01/2014 Echo: EF 25-30%. b. Echo 03/2015: Improved EF of 40-45%   COPD (chronic obstructive pulmonary disease) (HCC)    Depression    Diabetes mellitus without complication (HCC) 2018   started med therapy approx 2018   Hyperlipidemia    Hypertension    Hypertensive cardiomyopathy (HCC) 06/30/2015   Hypertensive heart disease    a. Since his 39's.   Morbid obesity (HCC)    Obstructive sleep apnea    a. Has not used CPAP since ~ 2012.   Sleep apnea     Current Outpatient Medications  Medication Sig Dispense Refill   Accu-Chek Softclix Lancets lancets Use to check blood sugar three times daily 100 each 0   albuterol (PROAIR HFA) 108 (90 Base) MCG/ACT inhaler Inhale 2 puffs into the lungs every 6 (six) hours as needed for wheezing or shortness of breath. 8.5 g 4   atorvastatin (LIPITOR) 20 MG tablet Take 1 tablet (20 mg total) by mouth daily. 90 tablet 3   benzonatate (TESSALON) 200 MG capsule Take 1 capsule (200 mg total) by mouth 3 (three) times daily as needed for cough. 30 capsule 1   Blood Glucose Monitoring Suppl (ACCU-CHEK GUIDE) w/Device  KIT 1 each by Does not apply route 3 (three) times daily. 1 kit 12   Blood Glucose Monitoring Suppl DEVI 1 each by Does not apply route in the morning, at noon, and at bedtime. May substitute to any manufacturer covered by patient's insurance. 1 each 0   Blood Pressure Monitoring (BLOOD PRESSURE CUFF) MISC USE AS DIRECTED ONCE DAILY AT 12 NOON. 1 each 0   budesonide-formoterol (SYMBICORT) 160-4.5 MCG/ACT inhaler Inhale 2 puffs into the lungs 2 (two) times daily. 10.2 g 6  cetirizine (ZYRTEC) 10 MG tablet Take 1 tablet (10 mg total) by mouth daily. 90 tablet 1   dapagliflozin propanediol (FARXIGA) 10 MG TABS tablet Take 10 mg by mouth daily.     famotidine (PEPCID) 20 MG tablet Take 1 tablet (20 mg total) by mouth daily after supper. 30 tablet 11   glucose blood (ACCU-CHEK GUIDE) test strip Use as instructed 100 each 12   hydrALAZINE (APRESOLINE) 25 MG tablet Take 1 tablet (25 mg total) by mouth 2 (two) times daily. 180 tablet 1   insulin glargine (LANTUS) 100 UNIT/ML Solostar Pen Inject 31 Units into the skin daily. 30 mL 1   Insulin Pen Needle 30G X 5 MM MISC Use as directed daily. 100 each 6   Insulin Pen Needle 32G X 4 MM MISC Use as directed 100 each 11   ipratropium-albuterol (DUONEB) 0.5-2.5 (3) MG/3ML SOLN Take 3 mLs by nebulization every 4 (four) hours as needed. 180 mL 1   losartan (COZAAR) 100 MG tablet TAKE ONE TABLET BY MOUTH EVERY DAY 90 tablet 4   metFORMIN (GLUCOPHAGE) 500 MG tablet Take 1 tablet (500 mg total) by mouth 2 (two) times daily with a meal. 180 tablet 1   montelukast (SINGULAIR) 10 MG tablet Take 1 tablet (10 mg total) by mouth at bedtime. 30 tablet 3   nebivolol (BYSTOLIC) 5 MG tablet Take 1 tablet (5 mg total) by mouth daily. 90 tablet 3   pantoprazole (PROTONIX) 40 MG tablet Take 1 tablet (40 mg total) by mouth daily. Take 30-60 min before first meal of the day 30 tablet 2   potassium chloride SA (KLOR-CON M) 20 MEQ tablet TAKE ONE TABLET BY MOUTH 2 TIMES A DAY 180  tablet 4   promethazine-dextromethorphan (PROMETHAZINE-DM) 6.25-15 MG/5ML syrup Take 5 mLs by mouth 4 (four) times daily as needed for cough. 240 mL 2   Rightest GL300 Lancets MISC USE AS DIRECTED. 100 each 0   Semaglutide, 2 MG/DOSE, (OZEMPIC, 2 MG/DOSE,) 8 MG/3ML SOPN Inject 2 mg into the skin once a week. 12 mL 3   Tiotropium Bromide Monohydrate (SPIRIVA RESPIMAT) 1.25 MCG/ACT AERS Inhale 2 puffs into the lungs daily. 4 g 5   torsemide (DEMADEX) 20 MG tablet Take 2 tabs daily every other day ALTERNATING with 1 tab daily every other day     zolpidem (AMBIEN) 5 MG tablet Take 1 tablet (5 mg total) by mouth at bedtime as needed for sleep. (Patient not taking: Reported on 12/02/2022) 30 tablet 1   No current facility-administered medications for this visit.   Vitals:   12/31/22 0927  BP: 134/82  Pulse: (!) 112  SpO2: 94%  Weight: 282 lb (127.9 kg)   Wt Readings from Last 3 Encounters:  12/31/22 282 lb (127.9 kg)  12/02/22 287 lb (130.2 kg)  11/20/22 288 lb (130.6 kg)   Lab Results  Component Value Date   CREATININE 1.47 (H) 08/30/2022   CREATININE 1.68 (H) 04/18/2022   CREATININE 1.65 (H) 11/14/2021   PHYSICAL EXAM:  General:  Well appearing. No resp difficulty HEENT: normal Neck: supple. JVP flat. No lymphadenopathy or thryomegaly appreciated. Cor: PMI normal. Regular rhythm & rate. No rubs, gallops or murmurs. Lungs: inspiratory wheezing throughout Abdomen: soft, nontender, nondistended. No hepatosplenomegaly. No bruits or masses.  Extremities: no cyanosis, clubbing, rash, trace pitting edema bilateral lower legs Neuro: alert & oriented x3 , cranial nerves grossly intact. Moves all 4 extremities w/o difficulty. Affect pleasant.   ECG: ST with HR 103 (personally  reviewed)   ASSESSMENT & PLAN:  1: NICM with preserved ejection fraction-  - likely due to HTN and OSA - NYHA Class III - euvolemic today - weighing daily; reminded to call for an overnight weight gain of >2  pounds or a weekly weight gain of >5 pounds - weight down 10 pounds from last visit here 3 months ago - Echo 03/27/15: EF 40-45% with mild MR - Echo 09/02/15: EF of 30-35%, mild mitral regurgitation and no aortic stenosis. - Echo 10/28/2018: EF of 60-65% along with mildly elevated PA pressure.  - Echo 09/23/22: EF 55-60% with moderate LVH, Grade I DD - trying to eat low sodium foods when he can but says it's difficult at times due to finances - continue bystolic 5mg  daily  - continue losartan 100mg  daily - continue farxiga 10mg  daily; ok to switch to jardiance 10mg  daily in the future if insurance coverage is better, he will let us know - continue torsemide 40mg  every other day alternating with 20mg  every other day - continue potassium BID - consider adding spironolactone but he hasn't taken his meds yet today so unclear of his BP; emphasized that he take his medications before his appointments to BP/ HR can be assessed better - decrease fluid intake; currently he estimates that he's drinking 101-118 ounces of fluid daily and he knows that he should be closer to 64 oz; can try sugar free gum/ lollipops to help his dry mouth instead of fluids - BNP 09/01/15 was 1546.0   2: HTN- - BP 134/82 - saw PCP Durwin Nora) 11/24 - continue hydralazine 25mg  BID - continue losartan 100mg  daily - BMP 08/30/22 reviewed and shows sodium 143, potassium 4.2, creatinine 1.47 and GFR 56 - BMET/ lipids today  3: Diabetes- - A1c on 08/30/22 was 7.8% - A1c today - continue metformin 500mg  BID - continue ozempic 2mg  weekly - saw nutritionist 12/24 - may benefit from endocrinology referral; will defer to PCP  4: Sleep apnea-  - wearing CPAP nightly as he has a new machine/ mask; says that he has to fill up the reservoir every morning as it runs completely out of water - last sleep study was done 10/16 - saw pulmonology Allison Quarry) 11/24  5: Lumbar spondylosis L3-S1- - lumbar MRI done 10/16/22 - saw neurosurgery  Doy Mince) 11/24 - EMG done 12/05/22 & showed neuropathy - discussion had about possible physical therapy  6: Sinus tachycardia- - EKG shows HR 103 - has not taken his medications yet today but plans to do so upon leaving as he's going to get some breakfast first   Return 3-4 weeks, sooner if needed.

## 2022-12-31 NOTE — Telephone Encounter (Signed)
-----   Message from Delma Freeze sent at 12/31/2022 12:51 PM EST ----- Potassium is normal and your lipids look great. Kidney function is a little worse so decrease torsemide to 20mg  daily with additional 20mg  if needed for weight gain, swelling or worsening shortness of breath. Will recheck labs at next visit.

## 2022-12-31 NOTE — Patient Instructions (Addendum)
Go over to the [facility]. Go pass the gift shop and have your blood work completed.  We will only call you if the results are abnormal or if the provider would like to make medication changes.   Please take your medications before coming to your appointments so we can assess your blood pressure and heart rate after you've taken your medications.    Let us know when you need [place] to send the jardiance prescription in.    If you receive a satisfaction survey regarding the [facility], please take the time to fill it out. This way we can continue to provide excellent care and make any changes that need to be made.

## 2022-12-31 NOTE — Telephone Encounter (Signed)
 Spoke with patient regarding the following results. Patient made aware and patient verbalized understanding.   Medication list updated.

## 2023-01-13 ENCOUNTER — Other Ambulatory Visit: Payer: Self-pay

## 2023-01-13 ENCOUNTER — Telehealth: Payer: Self-pay | Admitting: Internal Medicine

## 2023-01-13 DIAGNOSIS — J4541 Moderate persistent asthma with (acute) exacerbation: Secondary | ICD-10-CM

## 2023-01-13 MED ORDER — BUDESONIDE-FORMOTEROL FUMARATE 160-4.5 MCG/ACT IN AERO: 2.0000 | INHALATION_SPRAY | Freq: Two times a day (BID) | RESPIRATORY_TRACT | 6 refills | Status: DC

## 2023-01-13 MED ORDER — SPIRIVA RESPIMAT 1.25 MCG/ACT IN AERS: 2.0000 | INHALATION_SPRAY | Freq: Every day | RESPIRATORY_TRACT | 5 refills | Status: DC

## 2023-01-13 NOTE — Telephone Encounter (Signed)
Prescription Request  01/13/2023  LOV: 12/02/2022  What is the name of the medication or equipment? Tiotropium Bromide Monohydrate (SPIRIVA RESPIMAT) 1.25 MCG/ACT AERS [[id number]]  budesonide-formoterol (SYMBICORT) 160-4.5 MCG/ACT inhaler [[id number]]    Have you contacted your pharmacy to request a refill? Yes   Which pharmacy would you like this sent to?   [facility]  PHONE [phone]  ADDRESS [address] [place], [place]  [address]   [facility]     Patient notified that their request is being sent to the clinical staff for review and that they should receive a response within 2 business days.   Please advise at [facility] [phone]

## 2023-01-13 NOTE — Telephone Encounter (Signed)
Refills sent to pharmacy. 

## 2023-01-18 ENCOUNTER — Other Ambulatory Visit: Payer: Self-pay | Admitting: Gerontology

## 2023-01-18 ENCOUNTER — Other Ambulatory Visit: Payer: Self-pay | Admitting: Physician Assistant

## 2023-01-18 DIAGNOSIS — E1165 Type 2 diabetes mellitus with hyperglycemia: Secondary | ICD-10-CM

## 2023-01-18 DIAGNOSIS — I10 Essential (primary) hypertension: Secondary | ICD-10-CM

## 2023-01-19 ENCOUNTER — Other Ambulatory Visit: Payer: Self-pay | Admitting: Internal Medicine

## 2023-01-19 ENCOUNTER — Other Ambulatory Visit: Payer: Self-pay

## 2023-01-19 DIAGNOSIS — I10 Essential (primary) hypertension: Secondary | ICD-10-CM

## 2023-01-19 DIAGNOSIS — E1165 Type 2 diabetes mellitus with hyperglycemia: Secondary | ICD-10-CM

## 2023-01-20 ENCOUNTER — Ambulatory Visit: Payer: Medicare HMO | Admitting: Nurse Practitioner

## 2023-01-20 ENCOUNTER — Other Ambulatory Visit: Payer: Self-pay

## 2023-01-20 ENCOUNTER — Encounter: Payer: Self-pay | Admitting: Nurse Practitioner

## 2023-01-20 VITALS — BP 140/84 | HR 97 | Temp 97.7°F | Ht 69.0 in | Wt 283.6 lb

## 2023-01-20 DIAGNOSIS — J4541 Moderate persistent asthma with (acute) exacerbation: Secondary | ICD-10-CM

## 2023-01-20 DIAGNOSIS — G4733 Obstructive sleep apnea (adult) (pediatric): Secondary | ICD-10-CM | POA: Diagnosis not present

## 2023-01-20 DIAGNOSIS — Z9109 Other allergy status, other than to drugs and biological substances: Secondary | ICD-10-CM | POA: Diagnosis not present

## 2023-01-20 DIAGNOSIS — F5101 Primary insomnia: Secondary | ICD-10-CM

## 2023-01-20 LAB — POCT EXHALED NITRIC OXIDE: FeNO level (ppb): 56

## 2023-01-20 MED ORDER — PREDNISONE 10 MG PO TABS
ORAL_TABLET | ORAL | 0 refills | Status: AC
  Filled 2023-01-20: qty 30, 12d supply, fill #0

## 2023-01-20 MED ORDER — ESZOPICLONE 1 MG PO TABS
1.0000 mg | ORAL_TABLET | Freq: Every evening | ORAL | 0 refills | Status: DC | PRN
  Filled 2023-01-20: qty 30, 30d supply, fill #0

## 2023-01-20 MED ORDER — ALBUTEROL SULFATE HFA 108 (90 BASE) MCG/ACT IN AERS
2.0000 | INHALATION_SPRAY | Freq: Four times a day (QID) | RESPIRATORY_TRACT | 4 refills | Status: AC | PRN
  Filled 2023-01-20 (×2): qty 6.7, 25d supply, fill #0
  Filled 2023-12-19: qty 6.7, 25d supply, fill #1

## 2023-01-20 MED ORDER — METHYLPREDNISOLONE ACETATE 80 MG/ML IJ SUSP
80.0000 mg | Freq: Once | INTRAMUSCULAR | Status: AC
  Administered 2023-01-20: 80 mg via INTRAMUSCULAR

## 2023-01-20 MED ORDER — BUDESONIDE 0.5 MG/2ML IN SUSP
0.5000 mg | Freq: Two times a day (BID) | RESPIRATORY_TRACT | 3 refills | Status: AC | PRN
  Filled 2023-01-20 (×2): qty 120, 30d supply, fill #0

## 2023-01-20 MED ORDER — SPIRIVA RESPIMAT 1.25 MCG/ACT IN AERS: 2.0000 | INHALATION_SPRAY | Freq: Every day | RESPIRATORY_TRACT | Status: DC

## 2023-01-20 MED FILL — Losartan Potassium Tab 100 MG: ORAL | 90 days supply | Qty: 90 | Fill #0 | Status: AC

## 2023-01-20 MED FILL — Potassium Chloride Microencapsulated Crys ER Tab 20 mEq: ORAL | 90 days supply | Qty: 180 | Fill #0 | Status: AC

## 2023-01-20 MED FILL — Hydralazine HCl Tab 25 MG: ORAL | 90 days supply | Qty: 180 | Fill #0 | Status: AC

## 2023-01-20 MED FILL — Metformin HCl Tab 500 MG: ORAL | 90 days supply | Qty: 180 | Fill #0 | Status: AC

## 2023-01-20 NOTE — Assessment & Plan Note (Signed)
 Severe OSA on CPAP. Excellent compliance and control. He has a new machine, which seems to be malfunctioning. He reports running out of water every night. He's attempted to adjust humidity settings without much help. Small to moderate leaks on download from previous visit and today. Mask fits well per his report. Reached out to [employer] who will contact the patient to troubleshoot. Encouraged to continue using nightly. Aware of proper use/care and risks of untreated OSA. Aware of safe driving practices.

## 2023-01-20 NOTE — Patient Instructions (Addendum)
 Continue to use CPAP every night, minimum of 4-6 hours a night.  Change equipment as directed. Wash your tubing with warm soap and water daily, hang to dry. Wash humidifier portion weekly. Use bottled, distilled water and change daily  Be aware of reduced alertness and do not drive or operate heavy machinery if experiencing this or drowsiness.  Exercise encouraged, as tolerated. Healthy weight management discussed.  Notify if persistent daytime sleepiness occurs even with consistent use of CPAP.  Adjust humidity settings  You can call [employer] about the humidity problems because you shouldn't have to be refilling it every night.   Start eszopiclone  (Lunesta ) 1 mg At bedtime as needed for sleep. Take immediately before bed. If you take this for a few nights and it does not help you fall and stay asleep, you can increase to 2 mg on subsequent nights. If you do not notice that this is helping, then you can increase to 3 mg each night but do not go above 3 mg. Call me and let me know how things are working. Ensure you have 7-8 hours in the bed after taking. Monitor for any mood changes or changes in sleep habits. Stop and notify immediately if these occur. Do not drive or operate heavy machinery after taking. Do not take with alcohol or other sedating medications. Ensure you apply your CPAP within 5-10 minutes of taking to avoid falling asleep without it. May cause some morning grogginess or vivid dreams.     We discussed how untreated sleep apnea puts an individual at risk for cardiac arrhthymias, pulm HTN, DM, stroke and increases their risk for daytime accidents.   Continue Spiriva  2 puffs daily. Continue Albuterol  inhaler 2 puffs every 6 hours as needed for shortness of breath or wheezing. Notify if symptoms persist despite rescue inhaler/neb use.  Continue Symbicort  2 puff Twice daily. Brush tongue and rinse mouth afterwards.  Continue singulair  1 tab At bedtime   -Steroid shot today  -Prednisone   taper. 4 tabs for 3 days, then 3 tabs for 3 days, 2 tabs for 3 days, then 1 tab for 3 days, then stop. Take in AM with food. Start tomorrow. Keep an eye on your blood sugars and go to the emergency department if it's staying above 350  -Budesonide  nebs 2 mL Twice daily until symptoms improve then as needed for shortness of breath, wheezing, increased cough. Brush tongue and rinse mouth afterwards  Referral to allergist    Follow up with [name] as scheduled. Follow up with Katie Johnay Mano,NP in 5-6 weeks to see how Lunesta  is working. If symptoms do not improve or worsen, please contact office for sooner follow up or seek emergency care.

## 2023-01-20 NOTE — Progress Notes (Signed)
 PCP: Dr Melvenia HF Cardiologist: Ellouise Class NP   HPI: Bradley Mckenzie is a 58 y/o male who has a history of DMII, HTN, lumbar spondylosis, obstructive sleep apnea, depression, hyperlipidemia, asthma, CKD, morbid obesity and chronic systolic heart failure.   Remote tobacco exposure.   Was in the ED 12/09/21 due to left foot numbness. Was in the ED 12/18/21 due to LBP radiating down left leg.  Today he returns for HF follow up. Saw pulmonary yesterday and is being treated for asthmatic bronchitis. SOB with exertion. Denies PND/Orthopnea. Appetite ok. No fever or chills. Weight at home 279-281 pounds. Using CPAP every night. Taking all medications. Over the weekend he ran out of his medications. Waiting on mail order pharmacy to sent meds. He was able to refill meds at [facility] pharmacy. He restarted his medications today. He is waiting to receive jardiance and will stop farxiga  once he gets it. Disabled.   Echo 03/27/15: EF 40-45% with mild MR Echo 09/02/15: EF of 30-35%, mild mitral regurgitation and no aortic stenosis. Echo 10/28/2018: EF of 60-65% along with mildly elevated PA pressure.  Echo 09/23/22: EF 55-60% with moderate LVH, Grade I DD  ROS: All systems negative except as listed in HPI, PMH and Problem List.  SH:  Social History   Socioeconomic History   Marital status: Married    Spouse name: Not on file   Number of children: 2   Years of education: 12   Highest education level: GED or equivalent  Occupational History   Occupation: unemployed    Comment: hard time getting disability, denied medicaid   Tobacco Use   Smoking status: Never   Smokeless tobacco: Never   Tobacco comments:    Quit in his 29's.  Vaping Use   Vaping status: Never Used  Substance and Sexual Activity   Alcohol use: Not Currently    Comment: rarely alcohol use at special events   Drug use: Not Currently    Comment: Used marijuana in his teens.   Sexual activity: Not Currently  Other Topics Concern   Not on  file  Social History Narrative   Lives in [place] with his wife. Wife no longer working; now on disability. Have stable housing at group home that his wife used to work at. Sometimes struggles with transportation but not desperate. Wife has working car, his doesn't work. Had food stamps but cut off after 3 months. Wasn't eligible after that bc not able to work. Has been denied twice from disability. He was not able to obtain medicaid   Social Drivers of Health   Financial Resource Strain: Low Risk  (11/28/2021)   Overall Financial Resource Strain (CARDIA)    Difficulty of Paying Living Expenses: Not hard at all  Food Insecurity: No Food Insecurity (05/17/2021)   Hunger Vital Sign    Worried About Running Out of Food in the Last Year: Never true    Ran Out of Food in the Last Year: Never true  Transportation Needs: No Transportation Needs (05/17/2021)   PRAPARE - Administrator, Civil Service (Medical): No    Lack of Transportation (Non-Medical): No  Physical Activity: Inactive (11/28/2021)   Exercise Vital Sign    Days of Exercise per Week: 0 days    Minutes of Exercise per Session: 0 min  Stress: No Stress Concern Present (11/28/2021)   [employer] of Occupational Health - Occupational Stress Questionnaire    Feeling of Stress : Only a little  Social Connections: Moderately Integrated (  11/28/2021)   Social Connection and Isolation Panel [NHANES]    Frequency of Communication with Friends and Family: More than three times a week    Frequency of Social Gatherings with Friends and Family: Once a week    Attends Religious Services: 1 to 4 times per year    Active Member of [employer] or Organizations: No    Attends Banker Meetings: Never    Marital Status: Married  Catering Manager Violence: Not At Risk (11/28/2021)   Humiliation, Afraid, Rape, and Kick questionnaire    Fear of Current or Ex-Partner: No    Emotionally Abused: No    Physically Abused: No     Sexually Abused: No    FH:  Family History  Problem Relation Age of Onset   Heart disease Mother        alive & well.   Heart attack Father 34       died @ 53 of cancer.   Diabetes Father    Cancer Father        in his stomach   Cancer Sister    Cancer Paternal Uncle        strong FH malignancy multiple relavies     Past Medical History:  Diagnosis Date   Allergy    Asthma    Cardiomyopathy (HCC) 2016   a. 01/2014 Echo: EF 25-30%;  b. ? ischemic vs non-ischemic.  He's never had an ischemic eval.   Chronic combined systolic (congestive) and diastolic (congestive) heart failure (HCC)    a. 01/2014 Echo: EF 25-30%. b. Echo 03/2015: Improved EF of 40-45%   COPD (chronic obstructive pulmonary disease) (HCC)    Depression    Diabetes mellitus without complication (HCC) 2018   started med therapy approx 2018   Hyperlipidemia    Hypertension    Hypertensive cardiomyopathy (HCC) 06/30/2015   Hypertensive heart disease    a. Since his 59's.   Morbid obesity (HCC)    Obstructive sleep apnea    a. Has not used CPAP since ~ 2012.   Sleep apnea     Current Outpatient Medications  Medication Sig Dispense Refill   Accu-Chek Softclix Lancets lancets Use to check blood sugar three times daily 100 each 0   albuterol  (PROAIR  HFA) 108 (90 Base) MCG/ACT inhaler Inhale 2 puffs into the lungs every 6 (six) hours as needed for wheezing or shortness of breath. 6.7 g 4   atorvastatin  (LIPITOR) 20 MG tablet Take 1 tablet (20 mg total) by mouth daily. 90 tablet 3   Blood Glucose Monitoring Suppl (ACCU-CHEK GUIDE) w/Device KIT 1 each by Does not apply route 3 (three) times daily. 1 kit 12   Blood Glucose Monitoring Suppl DEVI 1 each by Does not apply route in the morning, at noon, and at bedtime. May substitute to any manufacturer covered by patient's insurance. 1 each 0   Blood Pressure Monitoring (BLOOD PRESSURE CUFF) MISC USE AS DIRECTED ONCE DAILY AT 12 NOON. 1 each 0   budesonide  (PULMICORT )  0.5 MG/2ML nebulizer solution Take 2 mLs (0.5 mg total) by nebulization 2 (two) times daily as needed (shortness of breath or wheezing). 120 mL 3   budesonide -formoterol  (SYMBICORT ) 160-4.5 MCG/ACT inhaler Inhale 2 puffs into the lungs 2 (two) times daily. 10.2 g 6   cetirizine  (ZYRTEC ) 10 MG tablet Take 1 tablet (10 mg total) by mouth daily. 90 tablet 1   dapagliflozin  propanediol (FARXIGA ) 10 MG TABS tablet Take 10 mg by mouth daily.  empagliflozin (JARDIANCE) 10 MG TABS tablet Take 10 mg by mouth daily.     eszopiclone  (LUNESTA ) 1 MG TABS tablet Take 1 tablet (1 mg total) by mouth at bedtime as needed for sleep. Take immediately before bedtime 30 tablet 0   famotidine  (PEPCID ) 20 MG tablet Take 1 tablet (20 mg total) by mouth daily after supper. 30 tablet 11   glucose blood (ACCU-CHEK GUIDE) test strip Use as instructed 100 each 12   hydrALAZINE  (APRESOLINE ) 25 MG tablet Take 1 tablet (25 mg total) by mouth 2 (two) times daily. 180 tablet 1   insulin  glargine (LANTUS ) 100 UNIT/ML Solostar Pen Inject 31 Units into the skin daily. 30 mL 1   Insulin  Pen Needle 30G X 5 MM MISC Use as directed daily. 100 each 6   Insulin  Pen Needle 32G X 4 MM MISC Use as directed 100 each 11   ipratropium-albuterol  (DUONEB) 0.5-2.5 (3) MG/3ML SOLN Take 3 mLs by nebulization every 4 (four) hours as needed. 180 mL 1   losartan  (COZAAR ) 100 MG tablet Take 1 tablet (100 mg total) by mouth daily. 90 tablet 4   metFORMIN  (GLUCOPHAGE ) 500 MG tablet Take 1 tablet (500 mg total) by mouth 2 (two) times daily with a meal. 180 tablet 1   montelukast  (SINGULAIR ) 10 MG tablet Take 1 tablet (10 mg total) by mouth at bedtime. 30 tablet 3   nebivolol  (BYSTOLIC ) 5 MG tablet Take 1 tablet (5 mg total) by mouth daily. 90 tablet 3   pantoprazole  (PROTONIX ) 40 MG tablet Take 1 tablet (40 mg total) by mouth daily. Take 30-60 min before first meal of the day 30 tablet 2   potassium chloride  SA (KLOR-CON  M) 20 MEQ tablet Take 1 tablet (20  mEq total) by mouth 2 (two) times daily. 180 tablet 4   predniSONE  (DELTASONE ) 10 MG tablet 4 tablets (40 mg total) daily for 3 days, THEN 3 tablets (30 mg total) daily for 3 days, THEN 2 tablets (20 mg total) daily for 3 days, THEN 1 tablet (10 mg total) daily for 3 days. 30 tablet 0   promethazine -dextromethorphan (PROMETHAZINE -DM) 6.25-15 MG/5ML syrup Take 5 mLs by mouth 4 (four) times daily as needed for cough. 240 mL 2   Rightest GL300 Lancets MISC USE AS DIRECTED. 100 each 0   Semaglutide , 2 MG/DOSE, (OZEMPIC , 2 MG/DOSE,) 8 MG/3ML SOPN Inject 2 mg into the skin once a week. 12 mL 3   Tiotropium Bromide  Monohydrate (SPIRIVA  RESPIMAT) 1.25 MCG/ACT AERS Inhale 2 puffs into the lungs daily. 4 g 5   Tiotropium Bromide  Monohydrate (SPIRIVA  RESPIMAT) 1.25 MCG/ACT AERS Inhale 2 puffs into the lungs daily.     torsemide  (DEMADEX ) 20 MG tablet Take 1 tablet (20 mg total) by mouth daily. May take an extra 20mg  tablet daily as needed for worsening shortness of breath, swelling, or weight gain. 30 tablet 3   No current facility-administered medications for this visit.   Vitals:   01/21/23 0934  BP: (!) 155/78  Pulse: 100  SpO2: 96%  Weight: 287 lb (130.2 kg)    Wt Readings from Last 3 Encounters:  01/21/23 287 lb (130.2 kg)  01/20/23 283 lb 9.6 oz (128.6 kg)  12/31/22 282 lb (127.9 kg)   Lab Results  Component Value Date   CREATININE 1.83 (H) 12/31/2022   CREATININE 1.47 (H) 08/30/2022   CREATININE 1.68 (H) 04/18/2022   PHYSICAL EXAM: General:  No resp difficulty HEENT: normal Neck: supple. no JVD. Carotids 2+ bilat; no  bruits. No lymphadenopathy or thryomegaly appreciated. Cor: PMI nondisplaced. Regular rate & rhythm. No rubs, gallops or murmurs. Lungs: EW  Abdomen: soft, nontender, nondistended. No hepatosplenomegaly. No bruits or masses. Good bowel sounds. Extremities: no cyanosis, clubbing, rash, edema Neuro: alert & orientedx3, cranial nerves grossly intact. moves all 4  extremities w/o difficulty. Affect pleasant   ASSESSMENT & PLAN:  1: Chronic HFpEF - likely due to HTN and OSA - - Echo 03/27/15: EF 40-45% with mild MR - Echo 09/02/15: EF of 30-35%, mild mitral regurgitation and no aortic stenosis. - Echo 10/28/2018: EF of 60-65% along with mildly elevated PA pressure.  - Echo 09/23/22: EF 55-60% with moderate LVH, Grade I DD -NYHA III Multifactorial with lung disease.  Volume status stable. Continue torsemide  20 mg daily with extra 20 mg as needed.  - continue bystolic  5mg  daily  - continue losartan  100mg  daily - continue farxiga  10mg  daily - consider spironolactone   - Could consider Cardiomems to help with volume management - Discussed low salt food choices.    2: HTN Uncontrolled . Continue current regimen.  Increase hydralazine  75 mg twice a day.   3: Diabetes- - 12/2022 A1c 9  -- continue metformin  500mg  BID - continue ozempic  2mg  weekly - Needs follow up with PCP   4: OSA  Using CPAP every night.   Follow up 2 months wit [facility]. Next visit will refer to HF MD to establish.    Bradley Dallaire NP-C  10:01 AM

## 2023-01-20 NOTE — Assessment & Plan Note (Signed)
 Difficulties with sleep latency and maintenance. Maintenance problems seem to be multifactorial but he has struggled with restless sleep for quite some time. Well controlled on CPAP. Tried Ambien  without any side effects but did not have much benefit from use. Limited coverage on sleep aids. Will trial him on Lunesta , which appears to be covered. Educated on proper use. Side effect/risk profile reviewed. Sleep hygiene reviewed.

## 2023-01-20 NOTE — Progress Notes (Signed)
 @Patient  ID: Bradley Mckenzie., male    DOB: 1965-10-06, 57 y.o.   MRN: [id number]  Chief Complaint  Patient presents with   Follow-up    Cough has increased.  SOB persistent with exertion.  Has to refill CPAP with water during night.    Referring provider: Melvenia Manus BRAVO, MD  HPI: 58 year old male, former remote smoker followed for asthma. He is a patient of Bradley Mckenzie and last seen in office 11/19/2022 by Bradley Mckenzie. Past medical history significant for CHF, HTN, cardiomyopathy, OSA on CPAP, DM, CKD, obesity, insomnia, HLD.  TEST/EVENTS:  \07/15/2014 PSG: AHI 47.1/h, SpO2 low 77% 10/28/2018 echo: EF 60-65%. Trivial TR. Mildly elevated PASP 05/10/2022 FeNO 47 ppb 08/30/2022 CXR: lungs are clear  09/10/2022 FeNO 50 ppb 11/08/2022 IgE 122; eos 300  12/24/2022 CT chest wo con: lungs are adequately inflated. No acute airspace process. 2 mm nodule over the RUL.   05/10/2022: OV with Bradley Mckenzie. Maintained on [place]ix[place] and PRN SABA. Use to walk wherever he wanted but around 2017 noted worsened. Has to stop on 2nd landing to catch breath since moving into new appt. Cough tends to be worse with exertion and variably at night. FeNO 47 ppb. Change to [facility] style inhaler. Need to try off DPI. Continue singulair . May need repeat echo.  09/10/2022: OV with Bradley Mckenzie to establish care for OSA/CPAP and follow up of asthma. He's been on CPAP for many years. First sleep study was in 2009; next study 2016. He got a refurbished machine a few years ago. Feels like he has not been sleeping as well with it and wakes feeling poorly rested most days. He's also waking up more at night, which he isn't sure if that's his asthma or his sleep. He feels tired most days. He denies morning headaches, drowsy driving, sleep parasomnias/paralysis.  He has trouble with falling asleep. Going to bed anywhere between 10pm and 5 am. He wakes several times a night. Usually gets up between 10-11 am. He is on disability. His CPAP is set on 16  cmH2O. Full face mask. Does not wear oxygen.  He has a history of HTN, CHF, DM. No shx of stroke. He quit smoking in his 20's. He does not drink alcohol. No excessive caffeine intake. No sleep aids. Lives with his wife. Family history of heart disease.  Regarding his breathing, he's still having trouble with a persistent cough. It becomes paroxysmal at times. Sometimes produces a small amount of yellow phlegm but it takes him a while to get it up. He has a lot of chest tightness and wheezing. He gets out of breath just walking back to the exam room. Sometimes wakes up feeling short winded or coughing. Denies any fevers, chills, hemoptysis, leg swelling, sinus symptoms. He is on [employer]. Uses albuterol  a few times a week.  06/12/2022-09/09/2022: CPAP 16 cmH2O 90/90 days; 99% >4 hr; average use 8 hr 11 min Leaks 27.7 AHI 1.8  11/19/2022: OV with Cyp[facility] Fanfan Mckenzie for follow up. Received new CPAP machine. It's working fine. Sometimes runs out of water at night. He does feel sleep is better with the CPAP than without. Unfortunately, just doesn't feel rested when he wakes up. Still feeling tired during the day. He attributes this to restless sleep at night. He also has trouble falling asleep more nights than not. He tells me he can be exhausted from the day and go to lay down and feel wide awake. It can take hours for him  to fall asleep. He's frustrated by his constant fatigue. He denies any drowsy driving, sleep parasomnias/paralysis. Mask - F20 memory foam  Regarding his breathing, he felt better after the last course of steroids and changing his inhaler. He felt like he could walk a little faster and exercise more easily. Unfortunately, feels like he's getting back to how he was last time I saw him. He has a persistent cough, dry for the most part. He's getting out of breath more easily. Wheezing more. Denies any fevers, chills, hemoptysis, leg swelling, sinus symptoms. He's using Symbicort  twice a day. Uses albuterol  a  few times a week.  FeNO 36 ppb 10/20/2022-11/18/2022: CPAP 10-18 cmH2O 30/30 days; 100% >4 hr; average use 8 hr 29 min Pressure 95th 13.8 Leaks 95th 13.4 AHI 0.9  01/19/2022: Today - follow up Patient presents today for follow up after being treated for asthma exacerbation. He was feeling much better but then over the last few weeks, has started to have more trouble with shortness of breath and wheezing. He's also coughing more. He is getting up phlegm but isn't sure what color it is. Denies any fevers, chills, hemoptysis, leg swelling, chest pain. No known sick exposures. He ran out of his albuterol . He is using his Symbicort  twice a day and Spiriva  once a day. He does feel like the Spiriva  has been helping. He takes zyrtec  and singulair  daily. He has history of elevated IgE and eosinophils. Bradley Mckenzie had recommend allergy referral if he continued to have breathing difficulties.  He is still having issues with his CPAP machine. It runs out of water every night and he has to refill it. This has been very bothersome to him. He did not have issues with his last CPAP machine. He has called [employer] who told them they adjusted his humidity settings but he's not sure that it actually changed. He still has trouble falling asleep most nights too. Ambien  didn't seem to make a difference. No history of sleep parasomnias/paralysis. No drowsy driving.  12/17/2022-01/14/2022: CPAP 10-18 cmH2O 27/30 days; 90% >4 hr; average use 8 hr 51 min Pressure 13.5  Leaks 33.6  AHI 0.7  FeNO 56  No Known Allergies   There is no immunization history on file for this patient.  Past Medical History:  Diagnosis Date   Allergy    Asthma    Cardiomyopathy (HCC) 2016   a. 01/2014 Echo: EF 25-30%;  b. ? ischemic vs non-ischemic.  He's never had an ischemic eval.   Chronic combined systolic (congestive) and diastolic (congestive) heart failure (HCC)    a. 01/2014 Echo: EF 25-30%. b. Echo 03/2015: Improved EF of 40-45%   COPD  (chronic obstructive pulmonary disease) (HCC)    Depression    Diabetes mellitus without complication (HCC) 2018   started med therapy approx 2018   Hyperlipidemia    Hypertension    Hypertensive cardiomyopathy (HCC) 06/30/2015   Hypertensive heart disease    a. Since his 9's.   Morbid obesity (HCC)    Obstructive sleep apnea    a. Has not used CPAP since ~ 2012.   Sleep apnea     Tobacco History: Social History   Tobacco Use  Smoking Status Never  Smokeless Tobacco Never  Tobacco Comments   Quit in his 20's.   Counseling given: Not Answered Tobacco comments: Quit in his 35's.   Outpatient Medications Prior to Visit  Medication Sig Dispense Refill   Accu-Chek Softclix Lancets lancets Use to check blood sugar  three times daily 100 each 0   atorvastatin  (LIPITOR) 20 MG tablet Take 1 tablet (20 mg total) by mouth daily. 90 tablet 3   Blood Glucose Monitoring Suppl (ACCU-CHEK GUIDE) w/Device KIT 1 each by Does not apply route 3 (three) times daily. 1 kit 12   Blood Glucose Monitoring Suppl DEVI 1 each by Does not apply route in the morning, at noon, and at bedtime. May substitute to any manufacturer covered by patient's insurance. 1 each 0   Blood Pressure Monitoring (BLOOD PRESSURE CUFF) MISC USE AS DIRECTED ONCE DAILY AT 12 NOON. 1 each 0   budesonide -formoterol  (SYMBICORT ) 160-4.5 MCG/ACT inhaler Inhale 2 puffs into the lungs 2 (two) times daily. 10.2 g 6   cetirizine  (ZYRTEC ) 10 MG tablet Take 1 tablet (10 mg total) by mouth daily. 90 tablet 1   dapagliflozin  propanediol (FARXIGA ) 10 MG TABS tablet Take 10 mg by mouth daily.     empagliflozin (JARDIANCE) 10 MG TABS tablet Take 10 mg by mouth daily.     famotidine  (PEPCID ) 20 MG tablet Take 1 tablet (20 mg total) by mouth daily after supper. 30 tablet 11   glucose blood (ACCU-CHEK GUIDE) test strip Use as instructed 100 each 12   hydrALAZINE  (APRESOLINE ) 25 MG tablet Take 1 tablet (25 mg total) by mouth 2 (two) times daily.  180 tablet 1   insulin  glargine (LANTUS ) 100 UNIT/ML Solostar Pen Inject 31 Units into the skin daily. 30 mL 1   Insulin  Pen Needle 30G X 5 MM MISC Use as directed daily. 100 each 6   Insulin  Pen Needle 32G X 4 MM MISC Use as directed 100 each 11   ipratropium-albuterol  (DUONEB) 0.5-2.5 (3) MG/3ML SOLN Take 3 mLs by nebulization every 4 (four) hours as needed. 180 mL 1   losartan  (COZAAR ) 100 MG tablet Take 1 tablet (100 mg total) by mouth daily. 90 tablet 4   metFORMIN  (GLUCOPHAGE ) 500 MG tablet Take 1 tablet (500 mg total) by mouth 2 (two) times daily with a meal. 180 tablet 1   montelukast  (SINGULAIR ) 10 MG tablet Take 1 tablet (10 mg total) by mouth at bedtime. 30 tablet 3   nebivolol  (BYSTOLIC ) 5 MG tablet Take 1 tablet (5 mg total) by mouth daily. 90 tablet 3   pantoprazole  (PROTONIX ) 40 MG tablet Take 1 tablet (40 mg total) by mouth daily. Take 30-60 min before first meal of the day 30 tablet 2   potassium chloride  SA (KLOR-CON  M) 20 MEQ tablet Take 1 tablet (20 mEq total) by mouth 2 (two) times daily. 180 tablet 4   promethazine -dextromethorphan (PROMETHAZINE -DM) 6.25-15 MG/5ML syrup Take 5 mLs by mouth 4 (four) times daily as needed for cough. 240 mL 2   Rightest GL300 Lancets MISC USE AS DIRECTED. 100 each 0   Semaglutide , 2 MG/DOSE, (OZEMPIC , 2 MG/DOSE,) 8 MG/3ML SOPN Inject 2 mg into the skin once a week. 12 mL 3   Tiotropium Bromide  Monohydrate (SPIRIVA  RESPIMAT) 1.25 MCG/ACT AERS Inhale 2 puffs into the lungs daily. 4 g 5   torsemide  (DEMADEX ) 20 MG tablet Take 1 tablet (20 mg total) by mouth daily. May take an extra 20mg  tablet daily as needed for worsening shortness of breath, swelling, or weight gain. 30 tablet 3   albuterol  (PROAIR  HFA) 108 (90 Base) MCG/ACT inhaler Inhale 2 puffs into the lungs every 6 (six) hours as needed for wheezing or shortness of breath. 8.5 g 4   zolpidem  (AMBIEN ) 5 MG tablet Take 1 tablet (5  mg total) by mouth at bedtime as needed for sleep. 30 tablet 1    No facility-administered medications prior to visit.     Review of Systems:   Constitutional: No weight loss or gain, night sweats, fevers, chills, or lassitude. +fatigue  HEENT: No headaches, difficulty swallowing, tooth/dental problems, or sore throat. No sneezing, itching, ear ache, nasal congestion, or post nasal drip CV:  No chest pain, orthopnea, PND, swelling in lower extremities, anasarca, dizziness, palpitations, syncope Resp: +shortness of breath with exertion; cough; wheezing; chest tightness. No excess mucus or change in color of mucus. No hemoptysis. No chest wall deformity GI:  No heartburn, indigestion GU: No dysuria, change in color of urine, urgency or frequency.   Skin: No rash, lesions, ulcerations MSK:  No joint pain or swelling.   Neuro: No dizziness or lightheadedness.  Psych: +stable depression, anxiety. No SI/HI. Mood stable. +sleep disturbance     Physical Exam:  BP (!) 140/84 (BP Location: Right Arm, Patient Position: Sitting, Cuff Size: Large)   Pulse 97   Temp 97.7 F (36.5 C) (Oral)   Ht 5' 9 (1.753 m)   Wt 283 lb 9.6 oz (128.6 kg)   SpO2 97%   BMI 41.88 kg/m   GEN: Pleasant, interactive, well-kempt; obese; in no acute distress. HEENT:  Normocephalic and atraumatic. PERRLA. Sclera white. Nasal turbinates pink, moist and patent bilaterally. No rhinorrhea present. Oropharynx pink and moist, without exudate or edema. No lesions, ulcerations, or postnasal drip. Mallampati III NECK:  Supple w/ fair ROM. No JVD present. Normal carotid impulses w/o bruits. Thyroid symmetrical with no goiter or nodules palpated. No lymphadenopathy.   CV: RRR, no m/r/g, no peripheral edema. Pulses intact, +2 bilaterally. No cyanosis, pallor or clubbing. PULMONARY:  Unlabored, regular breathing. Scattered wheezes bilaterally A&P. No accessory muscle use.  GI: BS present and normoactive. Soft, non-tender to palpation. No organomegaly or masses detected.  MSK: No erythema,  warmth or tenderness. Cap refil <2 sec all extrem. No deformities or joint swelling noted.  Neuro: A/Ox3. No focal deficits noted.   Skin: Warm, no lesions or rashe Psych: Normal affect and behavior. Judgement and thought content appropriate.     Lab Results:  CBC    Component Value Date/Time   WBC 9.9 11/08/2022 1008   WBC 9.9 12/29/2020 1609   RBC 5.25 11/08/2022 1008   RBC 5.07 12/29/2020 1609   HGB 14.2 11/08/2022 1008   HCT 44.8 11/08/2022 1008   PLT 235 11/08/2022 1008   MCV 85 11/08/2022 1008   MCV 86 02/07/2014 0427   MCH 27.0 11/08/2022 1008   MCH 26.4 12/29/2020 1609   MCHC 31.7 11/08/2022 1008   MCHC 31.6 12/29/2020 1609   RDW 14.5 11/08/2022 1008   RDW 16.1 (H) 02/07/2014 0427   LYMPHSABS 2.2 11/08/2022 1008   LYMPHSABS 1.4 02/07/2014 0427   MONOABS 0.9 06/06/2020 2250   MONOABS 0.8 02/07/2014 0427   EOSABS 0.3 11/08/2022 1008   EOSABS 0.2 02/07/2014 0427   BASOSABS 0.1 11/08/2022 1008   BASOSABS 0.1 02/07/2014 0427    BMET    Component Value Date/Time   NA 141 12/31/2022 1052   NA 143 08/30/2022 1622   NA 141 02/07/2014 0427   K 4.0 12/31/2022 1052   K 3.2 (L) 02/07/2014 0427   CL 103 12/31/2022 1052   CL 103 02/07/2014 0427   CO2 27 12/31/2022 1052   CO2 31 02/07/2014 0427   GLUCOSE 131 (H) 12/31/2022 1052   GLUCOSE 115 (  H) 02/07/2014 0427   BUN 15 12/31/2022 1052   BUN 11 08/30/2022 1622   BUN 14 02/07/2014 0427   CREATININE 1.83 (H) 12/31/2022 1052   CREATININE 1.24 02/07/2014 0427   CALCIUM  9.5 12/31/2022 1052   CALCIUM  8.6 02/07/2014 0427   GFRNONAA 43 (L) 12/31/2022 1052   GFRNONAA >60 02/07/2014 0427   GFRAA 57 (L) 02/24/2020 1842   GFRAA >60 02/07/2014 0427    BNP    Component Value Date/Time   BNP 1,546.0 (H) 09/01/2015 1122     Imaging:  CT CHEST WO CONTRAST Result Date: 01/05/2023 CLINICAL DATA:  Evaluate for chest wall mass or right axillary mass. EXAM: CT CHEST WITHOUT CONTRAST TECHNIQUE: Multidetector CT imaging of  the chest was performed following the standard protocol without IV contrast. Patient declined IV contrast. RADIATION DOSE REDUCTION: This exam was performed according to the departmental dose-optimization program which includes automated exposure control, adjustment of the mA and/or kV according to patient size and/or use of iterative reconstruction technique. COMPARISON:  Right axillary ultrasound 12/05/2022 and chest x-ray 08/30/2022 FINDINGS: Cardiovascular: Heart is normal size. Thoracic aorta is normal in caliber. Pulmonary arteries are unremarkable on this noncontrast study. Remaining vascular structures are unremarkable. Mediastinum/Nodes: No significant mediastinal or hilar adenopathy. Remaining mediastinal structures are normal. Lungs/Pleura: Lungs are adequately inflated. There is no acute airspace process or effusion. 2 mm nodule over the right upper lobe (image 51). No additional nodules identified. Airways are normal. Upper Abdomen: No acute abnormality. Musculoskeletal: No focal mass or other abnormality over the right axilla. No focal bony abnormality. IMPRESSION: 1. No acute cardiopulmonary disease. 2. No focal mass or other abnormality over the right axilla. 3. 2 mm nodule over the right upper lobe. No follow-up needed if patient is low-risk.This recommendation follows the consensus statement: Guidelines for Management of Incidental Pulmonary Nodules Detected on CT Images: From the Fleischner Society 2017; Radiology 2017; 284:228-243. Electronically Signed   By: Toribio Agreste M.D.   On: 01/05/2023 20:24    methylPREDNISolone  acetate (DEPO-MEDROL ) injection 80 mg     Date Action Dose Route User   01/20/2023 1605 Given 80 mg Intramuscular (Right Ventrogluteal) Issac Greig HERO, CMA          Latest Ref Rng & Units 10/04/2022    3:12 PM  PFT Results  FVC-Pre L 2.70   FVC-Predicted Pre % 56   FVC-Post L 2.91   FVC-Predicted Post % 61   Pre FEV1/FVC % % 83   Post FEV1/FCV % % 81   FEV1-Pre L  2.23   FEV1-Predicted Pre % 61   FEV1-Post L 2.36   DLCO uncorrected ml/min/mmHg 23.04   DLCO UNC% % 83   DLCO corrected ml/min/mmHg 23.04   DLCO COR %Predicted % 83   DLVA Predicted % 120   TLC L 4.95   TLC % Predicted % 73   RV % Predicted % 90     No results found for: NITRICOXIDE      Assessment & Plan:   Asthmatic bronchitis Recurrent exacerbation with elevated exhaled nitric oxide  testing, consistent with type II inflammation. Allergic phenotype. Will treat with depo inj 80 mg x 1 and prednisone  taper. Recent CT chest imaging without acute process. Add on budesonide  nebs PRN to target airway inflammation. Side effect profile reviewed. Will send referral to allergist. May need to consider biologic therapy if he remains poorly controlled. Action plan in place.   Patient Instructions  Continue to use CPAP every night, minimum of  4-6 hours a night.  Change equipment as directed. Wash your tubing with warm soap and water daily, hang to dry. Wash humidifier portion weekly. Use bottled, distilled water and change daily  Be aware of reduced alertness and do not drive or operate heavy machinery if experiencing this or drowsiness.  Exercise encouraged, as tolerated. Healthy weight management discussed.  Notify if persistent daytime sleepiness occurs even with consistent use of CPAP.  Adjust humidity settings  You can call [employer] about the humidity problems because you shouldn't have to be refilling it every night.   Start eszopiclone  (Lunesta ) 1 mg At bedtime as needed for sleep. Take immediately before bed. If you take this for a few nights and it does not help you fall and stay asleep, you can increase to 2 mg on subsequent nights. If you do not notice that this is helping, then you can increase to 3 mg each night but do not go above 3 mg. Call me and let me know how things are working. Ensure you have 7-8 hours in the bed after taking. Monitor for any mood changes or changes in  sleep habits. Stop and notify immediately if these occur. Do not drive or operate heavy machinery after taking. Do not take with alcohol or other sedating medications. Ensure you apply your CPAP within 5-10 minutes of taking to avoid falling asleep without it. May cause some morning grogginess or vivid dreams.     We discussed how untreated sleep apnea puts an individual at risk for cardiac arrhthymias, pulm HTN, DM, stroke and increases their risk for daytime accidents.   Continue Spiriva  2 puffs daily. Continue Albuterol  inhaler 2 puffs every 6 hours as needed for shortness of breath or wheezing. Notify if symptoms persist despite rescue inhaler/neb use.  Continue Symbicort  2 puff Twice daily. Brush tongue and rinse mouth afterwards.  Continue singulair  1 tab At bedtime   -Steroid shot today  -Prednisone  taper. 4 tabs for 3 days, then 3 tabs for 3 days, 2 tabs for 3 days, then 1 tab for 3 days, then stop. Take in AM with food. Start tomorrow. Keep an eye on your blood sugars and go to the emergency department if it's staying above 350  -Budesonide  nebs 2 mL Twice daily until symptoms improve then as needed for shortness of breath, wheezing, increased cough. Brush tongue and rinse mouth afterwards  Referral to allergist    Follow up with Bradley Mckenzie as scheduled. Follow up with Katie Ayodele Hartsock,Mckenzie in 5-6 weeks to see how Lunesta  is working. If symptoms do not improve or worsen, please contact office for sooner follow up or seek emergency care.   OSA on CPAP Severe OSA on CPAP. Excellent compliance and control. He has a new machine, which seems to be malfunctioning. He reports running out of water every night. He's attempted to adjust humidity settings without much help. Small to moderate leaks on download from previous visit and today. Mask fits well per his report. Reached out to [employer] who will contact the patient to troubleshoot. Encouraged to continue using nightly. Aware of proper use/care and risks  of untreated OSA. Aware of safe driving practices.   Insomnia Difficulties with sleep latency and maintenance. Maintenance problems seem to be multifactorial but he has struggled with restless sleep for quite some time. Well controlled on CPAP. Tried Ambien  without any side effects but did not have much benefit from use. Limited coverage on sleep aids. Will trial him on Lunesta , which appears to be  covered. Educated on proper use. Side effect/risk profile reviewed. Sleep hygiene reviewed.     I spent 42 minutes of dedicated to the care of this patient on the date of this encounter to include pre-visit review of records, face-to-face time with the patient discussing conditions above, post visit ordering of testing, clinical documentation with the electronic health record, making appropriate referrals as documented, and communicating necessary findings to members of the patients care team.  Comer LULLA Rouleau, Mckenzie 01/20/2023  Pt aware and understands Mckenzie's role.

## 2023-01-20 NOTE — Assessment & Plan Note (Addendum)
 Recurrent exacerbation with elevated exhaled nitric oxide  testing, consistent with type II inflammation. Allergic phenotype. Will treat with depo inj 80 mg x 1 and prednisone  taper. Recent CT chest imaging without acute process. Add on budesonide  nebs PRN to target airway inflammation. Side effect profile reviewed. Will send referral to allergist. May need to consider biologic therapy if he remains poorly controlled. Action plan in place.   Patient Instructions  Continue to use CPAP every night, minimum of 4-6 hours a night.  Change equipment as directed. Wash your tubing with warm soap and water daily, hang to dry. Wash humidifier portion weekly. Use bottled, distilled water and change daily  Be aware of reduced alertness and do not drive or operate heavy machinery if experiencing this or drowsiness.  Exercise encouraged, as tolerated. Healthy weight management discussed.  Notify if persistent daytime sleepiness occurs even with consistent use of CPAP.  Adjust humidity settings  You can call [employer] about the humidity problems because you shouldn't have to be refilling it every night.   Start eszopiclone  (Lunesta ) 1 mg At bedtime as needed for sleep. Take immediately before bed. If you take this for a few nights and it does not help you fall and stay asleep, you can increase to 2 mg on subsequent nights. If you do not notice that this is helping, then you can increase to 3 mg each night but do not go above 3 mg. Call me and let me know how things are working. Ensure you have 7-8 hours in the bed after taking. Monitor for any mood changes or changes in sleep habits. Stop and notify immediately if these occur. Do not drive or operate heavy machinery after taking. Do not take with alcohol or other sedating medications. Ensure you apply your CPAP within 5-10 minutes of taking to avoid falling asleep without it. May cause some morning grogginess or vivid dreams.     We discussed how untreated sleep apnea  puts an individual at risk for cardiac arrhthymias, pulm HTN, DM, stroke and increases their risk for daytime accidents.   Continue Spiriva  2 puffs daily. Continue Albuterol  inhaler 2 puffs every 6 hours as needed for shortness of breath or wheezing. Notify if symptoms persist despite rescue inhaler/neb use.  Continue Symbicort  2 puff Twice daily. Brush tongue and rinse mouth afterwards.  Continue singulair  1 tab At bedtime   -Steroid shot today  -Prednisone  taper. 4 tabs for 3 days, then 3 tabs for 3 days, 2 tabs for 3 days, then 1 tab for 3 days, then stop. Take in AM with food. Start tomorrow. Keep an eye on your blood sugars and go to the emergency department if it's staying above 350  -Budesonide  nebs 2 mL Twice daily until symptoms improve then as needed for shortness of breath, wheezing, increased cough. Brush tongue and rinse mouth afterwards  Referral to allergist    Follow up with [name] as scheduled. Follow up with Katie Annalynn Centanni,NP in 5-6 weeks to see how Lunesta  is working. If symptoms do not improve or worsen, please contact office for sooner follow up or seek emergency care.

## 2023-01-21 ENCOUNTER — Other Ambulatory Visit (HOSPITAL_BASED_OUTPATIENT_CLINIC_OR_DEPARTMENT_OTHER): Payer: Self-pay

## 2023-01-21 ENCOUNTER — Other Ambulatory Visit: Payer: Self-pay

## 2023-01-21 ENCOUNTER — Ambulatory Visit: Payer: Medicare HMO | Attending: Family | Admitting: Adult Health

## 2023-01-21 ENCOUNTER — Encounter: Payer: Self-pay | Admitting: Adult Health

## 2023-01-21 VITALS — BP 155/78 | HR 100 | Wt 287.0 lb

## 2023-01-21 DIAGNOSIS — I10 Essential (primary) hypertension: Secondary | ICD-10-CM | POA: Diagnosis not present

## 2023-01-21 DIAGNOSIS — G4733 Obstructive sleep apnea (adult) (pediatric): Secondary | ICD-10-CM

## 2023-01-21 DIAGNOSIS — I5022 Chronic systolic (congestive) heart failure: Secondary | ICD-10-CM

## 2023-01-21 MED ORDER — HYDRALAZINE HCL 25 MG PO TABS
75.0000 mg | ORAL_TABLET | Freq: Two times a day (BID) | ORAL | 1 refills | Status: DC
  Filled 2023-01-21 – 2023-04-17 (×2): qty 180, 30d supply, fill #0
  Filled 2023-07-24: qty 180, 30d supply, fill #1

## 2023-01-21 NOTE — Patient Instructions (Signed)
 INCREASE Hydralazine  to 75 mg Twice daily.  Your physician recommends that you schedule a follow-up appointment in: 2 months.  At the [facility], you and your health needs are our priority. As part of our continuing mission to provide you with exceptional heart care, we have created designated Provider Care Teams. These Care Teams include your primary Cardiologist (physician) and Advanced Practice Providers (APPs- Physician Assistants and Nurse Practitioners) who all work together to provide you with the care you need, when you need it.   You may see any of the following providers on your designated Care Team at your next follow up: Dr Toribio Fuel Dr Ezra Shuck Dr. Ria Commander Dr. Morene Brownie Amy Lenetta, NP Caffie Shed, [place] [facility] [place], [place] Beckey Coe, NP Jordan Lee, NP Jaun Bash, PharmD   Please be sure to bring in all your medications bottles to every appointment.    Thank you for choosing [facility]

## 2023-01-28 ENCOUNTER — Other Ambulatory Visit: Payer: Self-pay

## 2023-01-30 ENCOUNTER — Other Ambulatory Visit: Payer: Self-pay

## 2023-02-09 NOTE — Progress Notes (Unsigned)
Bradley Maxfield Gildersleeve., male    DOB: February 08, 1965    MRN: [id number]   Brief patient profile:  89  yobf never really smoked regularly served in [employer] referred to pulmonary clinic in [place]  05/10/2022 by [name] PA  for sob/ cough    Baseline wt 230 around 2016  History of Present Illness  05/10/2022  Pulmonary/ 1st office eval/ Bradley Mckenzie / [name] - wt 273/  on maint wixella and prn saba but very poor Radiation protection practitioner Complaint  Patient presents with   Consult    Pt consult he says that he has been experiencing SOB >52yrs was unable to narrow down a timeframe of it worsening. States he smoke around the age of 18-18   Dyspnea:  used to walk any where he wanted but around 2017 noted worsened now has go to slower than  others.  Has to stop on 2nd landing  to catch breath since moving into new appt. Cough: tends to be worse with ex and variably at hs/ non productive Sleep: flat bed x 1 pillows   7 nights/ a week / places cpap always sob and sometimes coughing / has not tried recliner  SABA use: w/in on hour of ov Rec Plan A = Automatic = Always=    Symbicort 80 Take 2 puffs (or Breztri x one puff) first thing in am and then another 2 puffs about 12 hours later.  Work on inhaler technique:  Plan B = Backup (to supplement plan A, not to replace it) Only use your albuterol inhaler as a rescue medication Plan C = Crisis (instead of Plan B but only if Plan B stops working) - only use your albuterol nebulizer if you first try Plan B  Pantoprazole (protonix) 40 mg   Take  30-60 min before first meal of the day and Pepcid (famotidine)  20 mg after supper until return to office - this is the best way to tell whether stomach acid is contributing to your problem.   GERD diet reviewed, bed blocks rec   Please schedule a follow up office visit in 4 weeks, sooner if needed  with all medications /inhalers/ solutions in hand so we can verify exactly what you are taking. This includes all medications from  all doctors and over the counters     09/30/2022  f/u ov/[place] office/Bradley Mckenzie re: sob / cough with elevated FENO c/w asthma  maint on symb 160  2bid did not bring meds as rec  Chief Complaint  Patient presents with   Shortness of Breath   Dyspnea:  x years  x treadmill  x 10 minutes "nl pace" / slt incline  Cough: still on tessalon daily > did not understand prn/ no longer on gerd rx and has overt HB Sleeping: cpap per Katie s resp cc  SABA use: takes it daily  02: none   Rec Plan A = Automatic = Always=    Symbicort 160 Take 2 puffs first thing in am and then another 2 puffs about 12 hours later.  Resume Pantoprazole (protonix) 40 mg   Take  30-60 min before first meal of the day and Pepcid (famotidine)  20 mg after supper until return to office  Plan B = Backup (to supplement plan A, not to replace it) Only use your albuterol inhaler as a rescue medication Plan C = Crisis (instead of Plan B but only if Plan B stops working) - only use your albuterol nebulizer if you first try  Plan B   Please schedule a follow up office visit in 6 weeks, call sooner if needed with all medications /inhalers/ solutions in hand    10/04/22  pfts p symbicort 160 wnl  x for ERV 6% at wt 283   11/08/2022  f/u ov/[place] office/Bradley Mckenzie re: asthma/ MO maint on symbicort/spiriva    did not bring meds  /  Chief Complaint  Patient presents with   Moderate persistent asthmatic bronchitis   Dyspnea:  treadmill 3 x per week, 16 min (not electric) and can't set resistance  Cough: x sev days severe cough not responding to tessilon 200 but only taking one a day / min mucoid production Sleeping: having trouble with cpap  mask SABA use: not much at all  02: none Rec Call if me can't work out your cpap issues and we'll send you for a second opinion  Depomedrol 120 mg IM  For cough >  try tessalon 200 mg up to every 4 hours and supplement with  phenergan  DM up to a tsp every 4 hours as needed  Please remember to  go to the lab department   for your tests - we will call you with the results when they are available.       02/10/2023  f/u ov/[place] office/Bradley Mckenzie re: Asthma/MO  maint on symbicort/spiriva  No chief complaint on file. Dyspnea:  treadmill x 15 -20 min  Cough: sporadic  Sleeping: maybe 30 degrees s resp cc on cpap  SABA use: none 02: none       No obvious day to day or daytime variability or assoc excess/ purulent sputum or mucus plugs or hemoptysis or cp or chest tightness, subjective wheeze or overt sinus or hb symptoms.    Also denies any obvious fluctuation of symptoms with weather or environmental changes or other aggravating or alleviating factors except as outlined above   No unusual exposure hx or h/o childhood pna/ asthma or knowledge of premature birth.  Current Allergies, Complete Past Medical History, Past Surgical History, Family History, and Social History were reviewed in [name] record.  ROS  The following are not active complaints unless bolded Hoarseness, sore throat, dysphagia, dental problems, itching, sneezing,  nasal congestion or discharge of excess mucus or purulent secretions, ear ache,   fever, chills, sweats, unintended wt loss or wt gain, classically pleuritic or exertional cp,  orthopnea pnd or arm/hand swelling  or leg swelling, presyncope, palpitations, abdominal pain, anorexia, nausea, vomiting, diarrhea  or change in bowel habits or change in bladder habits, change in stools or change in urine, dysuria, hematuria,  rash, arthralgias, visual complaints, headache, numbness, weakness or ataxia or problems with walking or coordination,  change in mood or  memory.        No outpatient medications have been marked as taking for the 02/10/23 encounter (Appointment) with Nyoka Cowden, MD.            Past Medical History:  Diagnosis Date   Allergy    Asthma    Cardiomyopathy (HCC) 2016   a. 01/2014 Echo: EF 25-30%;  b. ?  ischemic vs non-ischemic.  He's never had an ischemic eval.   Chronic combined systolic (congestive) and diastolic (congestive) heart failure (HCC)    a. 01/2014 Echo: EF 25-30%. b. Echo 03/2015: Improved EF of 40-45%   COPD (chronic obstructive pulmonary disease) (HCC)    Depression    Diabetes mellitus without complication (HCC) 2018   started med  therapy approx 2018   Hyperlipidemia    Hypertension    Hypertensive cardiomyopathy (HCC) 06/30/2015   Hypertensive heart disease    a. Since his 28's.   Morbid obesity (HCC)    Obstructive sleep apnea    a. Has not used CPAP since ~ 2012.   Sleep apnea        Objective:    Wts  02/10/2023       ***  11/08/2022     284  09/30/22 289 lb (131.1 kg)  09/13/22 287 lb 9.6 oz (130.5 kg)  09/10/22 283 lb 12.8 oz (128.7 kg)      Vital signs reviewed  02/10/2023  - Note at rest 02 sats  ***% on ***   General appearance:    ***           Assessment

## 2023-02-10 ENCOUNTER — Encounter: Payer: Self-pay | Admitting: Internal Medicine

## 2023-02-10 ENCOUNTER — Ambulatory Visit: Payer: Medicare HMO | Admitting: Internal Medicine

## 2023-02-10 VITALS — BP 152/82 | HR 97 | Ht 69.0 in | Wt 289.0 lb

## 2023-02-10 DIAGNOSIS — J4531 Mild persistent asthma with (acute) exacerbation: Secondary | ICD-10-CM | POA: Diagnosis not present

## 2023-02-10 NOTE — Patient Instructions (Addendum)
Stop spiriva  Continue Symbicort 160 Take 2 puffs first thing in am and then another 2 puffs about 12 hours later.   Also  Ok to try albuterol 15 min before an activity (on alternating days)  that you know would usually make you short of breath and see if it makes any difference and if makes none then don't take albuterol after activity unless you can't catch your breath as this means it's the resting that helps, not the albuterol.  To get the most out of exercise, you need to be continuously aware that you are short of breath, but never out of breath, for at least 30 minutes daily. As you improve, it will actually be easier for you to do the same amount of exercise  in  30 minutes so always push to the level where you are short of breath.     Please schedule a follow up visit in 6 months but call sooner if needed

## 2023-02-10 NOTE — Progress Notes (Signed)
 Initial neurology clinic note  Reason for Evaluation: Consultation requested by Hilma Hastings, PA-C for an opinion regarding neuropathy. My final recommendations will be communicated back to the requesting physician by way of shared medical record or letter to requesting physician via [place]  mail.  HPI: This is [name]., a 58 y.o. left-handed male with a medical history of DM, HTN c/b cardiomyopathy, OSA, COPD, CKD, degenerative disk disease who presents to neurology clinic with the chief complaint of numbness and tingling in his legs. The patient is alone today.  Symptoms started around 11/2021 with tingling in his feet. He went to the ED and was told this was neuropathy. He has had others tell him it was not and that it was coming from his back. He does endorse back pain that comes and goes. He has been seen by NSGY and sent for EMG. EMG done by me on 12/17/22 showed at least a moderate large fiber sensorimotor polyneuropathy.  Patient has numbness in the bottom of his feet but not significant pain. He has taken gabapentin  in the past, but not currently.  He has stiffness of hands and occasional numbness, but denies significant tingling, burning, or pain in his upper extremities.  He denies imbalance or falls. He endorses cramps and spasms throughout his body from neck to ankles.  The patient denies symptoms suggestive of oculobulbar weakness including diplopia, ptosis, dysphagia, poor saliva control, dysarthria/dysphonia, impaired mastication, facial weakness/droop.  The patient does not report definitive symptoms referable to autonomic dysfunction including impaired sweating, heat or cold intolerance, excessive mucosal dryness, gastroparetic early satiety, postprandial abdominal bloating, constipation, bowel or bladder dyscontrol, or syncope/presyncope/orthostatic intolerance.   He does endorse erectile dysfunction.  Of note, patient does have history of cardiomyopathy. Patient is  not aware of why.  He does not report any constitutional symptoms like fever, night sweats, anorexia or unintentional weight loss.  EtOH use: None  Restrictive diet? No Family history of neuropathy/myopathy/neurologic disease or cardiomyopathy? Not known   MEDICATIONS:  Outpatient Encounter Medications as of 02/19/2023  Medication Sig   Accu-Chek Softclix Lancets lancets Use to check blood sugar three times daily   albuterol  (PROAIR  HFA) 108 (90 Base) MCG/ACT inhaler Inhale 2 puffs into the lungs every 6 (six) hours as needed for wheezing or shortness of breath.   atorvastatin  (LIPITOR) 20 MG tablet Take 1 tablet (20 mg total) by mouth daily.   Blood Glucose Monitoring Suppl (ACCU-CHEK GUIDE) w/Device KIT 1 each by Does not apply route 3 (three) times daily.   Blood Glucose Monitoring Suppl DEVI 1 each by Does not apply route in the morning, at noon, and at bedtime. May substitute to any manufacturer covered by patient's insurance.   Blood Pressure Monitoring (BLOOD PRESSURE CUFF) MISC USE AS DIRECTED ONCE DAILY AT 12 NOON.   budesonide  (PULMICORT ) 0.5 MG/2ML nebulizer solution Take 2 mLs (0.5 mg total) by nebulization 2 (two) times daily as needed (shortness of breath or wheezing).   budesonide -formoterol  (SYMBICORT ) 160-4.5 MCG/ACT inhaler Inhale 2 puffs into the lungs 2 (two) times daily.   cetirizine  (ZYRTEC ) 10 MG tablet Take 1 tablet (10 mg total) by mouth daily.   dapagliflozin  propanediol (FARXIGA ) 10 MG TABS tablet Take 10 mg by mouth daily.   famotidine  (PEPCID ) 20 MG tablet Take 1 tablet (20 mg total) by mouth daily after supper.   glucose blood (ACCU-CHEK GUIDE) test strip Use as instructed   hydrALAZINE  (APRESOLINE ) 25 MG tablet Take 3 tablets (75 mg total) by  mouth 2 (two) times daily.   insulin  glargine (LANTUS ) 100 UNIT/ML Solostar Pen Inject 31 Units into the skin daily.   Insulin  Pen Needle 30G X 5 MM MISC Use as directed daily.   Insulin  Pen Needle 32G X 4 MM MISC Use as  directed   ipratropium-albuterol  (DUONEB) 0.5-2.5 (3) MG/3ML SOLN Take 3 mLs by nebulization every 4 (four) hours as needed.   losartan  (COZAAR ) 100 MG tablet Take 1 tablet (100 mg total) by mouth daily.   metFORMIN  (GLUCOPHAGE ) 500 MG tablet Take 1 tablet (500 mg total) by mouth 2 (two) times daily with a meal.   montelukast  (SINGULAIR ) 10 MG tablet Take 1 tablet (10 mg total) by mouth at bedtime.   nebivolol  (BYSTOLIC ) 5 MG tablet Take 1 tablet (5 mg total) by mouth daily.   pantoprazole  (PROTONIX ) 40 MG tablet Take 1 tablet (40 mg total) by mouth daily. Take 30-60 min before first meal of the day   potassium chloride  SA (KLOR-CON  M) 20 MEQ tablet Take 1 tablet (20 mEq total) by mouth 2 (two) times daily.   promethazine -dextromethorphan (PROMETHAZINE -DM) 6.25-15 MG/5ML syrup Take 5 mLs by mouth 4 (four) times daily as needed for cough.   Rightest GL300 Lancets MISC USE AS DIRECTED.   Tiotropium Bromide  Monohydrate (SPIRIVA  RESPIMAT) 1.25 MCG/ACT AERS Inhale 2 puffs into the lungs daily.   Tiotropium Bromide  Monohydrate (SPIRIVA  RESPIMAT) 1.25 MCG/ACT AERS Inhale 2 puffs into the lungs daily.   torsemide  (DEMADEX ) 20 MG tablet Take 1 tablet (20 mg total) by mouth daily. May take an extra 20mg  tablet daily as needed for worsening shortness of breath, swelling, or weight gain.   empagliflozin (JARDIANCE) 10 MG TABS tablet Take 10 mg by mouth daily. (Patient not taking: Reported on 02/19/2023)   eszopiclone  (LUNESTA ) 1 MG TABS tablet Take 1 tablet (1 mg total) by mouth at bedtime as needed for sleep. Take immediately before bedtime (Patient not taking: Reported on 02/19/2023)   Semaglutide , 2 MG/DOSE, (OZEMPIC , 2 MG/DOSE,) 8 MG/3ML SOPN Inject 2 mg into the skin once a week. (Patient not taking: Reported on 02/19/2023)   No facility-administered encounter medications on file as of 02/19/2023.    PAST MEDICAL HISTORY: Past Medical History:  Diagnosis Date   Allergy    Asthma    Cardiomyopathy (HCC)  2016   a. 01/2014 Echo: EF 25-30%;  b. ? ischemic vs non-ischemic.  He's never had an ischemic eval.   Chronic combined systolic (congestive) and diastolic (congestive) heart failure (HCC)    a. 01/2014 Echo: EF 25-30%. b. Echo 03/2015: Improved EF of 40-45%   COPD (chronic obstructive pulmonary disease) (HCC)    Depression    Diabetes mellitus without complication (HCC) 2018   started med therapy approx 2018   Hyperlipidemia    Hypertension    Hypertensive cardiomyopathy (HCC) 06/30/2015   Hypertensive heart disease    a. Since his 19's.   Morbid obesity (HCC)    Obstructive sleep apnea    a. Has not used CPAP since ~ 2012.   Sleep apnea     PAST SURGICAL HISTORY: Past Surgical History:  Procedure Laterality Date   HERNIA REPAIR  2004    ALLERGIES: No Known Allergies  FAMILY HISTORY: Family History  Problem Relation Age of Onset   Heart disease Mother        alive & well.   Heart attack Father 63       died @ 71 of cancer.   Diabetes Father  Cancer Father        in his stomach   Cancer Sister    Cancer Paternal Uncle        strong FH malignancy multiple relavies     SOCIAL HISTORY: Social History   Tobacco Use   Smoking status: Never   Smokeless tobacco: Never   Tobacco comments:    Quit in his 4's.  Vaping Use   Vaping status: Never Used  Substance Use Topics   Alcohol use: Not Currently    Comment: rarely alcohol use at special events   Drug use: Not Currently    Comment: Used marijuana in his teens.   Social History   Social History Narrative   Lives in [place] with his wife. Wife no longer working; now on disability. Have stable housing at group home that his wife used to work at. Sometimes struggles with transportation but not desperate. Wife has working car, his doesn't work. Had food stamps but cut off after 3 months. Wasn't eligible after that bc not able to work. Has been denied twice from disability. He was not able to obtain medicaid      OBJECTIVE: PHYSICAL EXAM: BP 125/71   Pulse 99   Ht 5' 9 (1.753 m)   Wt 285 lb (129.3 kg)   SpO2 95%   BMI 42.09 kg/m   General: General appearance: Awake and alert. No distress. Cooperative with exam.  Skin: No obvious rash or jaundice. HEENT: Atraumatic. Anicteric. Lungs: Non-labored breathing on room air  Heart: Regular. No carotid  Extremities: No edema. No obvious deformity.  Musculoskeletal: No obvious joint swelling. Psych: Affect appropriate.  Neurological: Mental Status: Alert. Speech fluent. No pseudobulbar affect Cranial Nerves: CNII: No RAPD. Visual fields grossly intact. CNIII, IV, VI: PERRL. No nystagmus. EOMI. CN V: Facial sensation intact bilaterally to fine touch. CN VII: Facial muscles symmetric and strong. No ptosis at rest. CN VIII: Hearing grossly intact bilaterally. CN IX: No hypophonia. CN X: Palate elevates symmetrically. CN XI: Full strength shoulder shrug bilaterally. CN XII: Tongue protrusion full and midline. No atrophy or fasciculations. No significant dysarthria Motor: Tone: paratonia in bilateral upper extremities. No atrophy.  Individual muscle group testing (MRC grade out of 5):  Movement     Neck flexion 5    Neck extension 5     Right Left   Shoulder abduction 5 5   Elbow flexion 5 5   Elbow extension 5 5   Finger abduction - FDI 5 5   Finger abduction - ADM 5 5   Finger extension 5 5   Finger distal flexion - 2/3 5 5    Finger distal flexion - 4/5 5 5    Thumb flexion - FPL 5 5   Thumb abduction - APB 5 5    Hip flexion 5 5   Knee extension 5 5   Knee flexion 5 5   Dorsiflexion 5 5   Plantarflexion 5 5   Great toe extension 5- 5-   Great toe flexion 5 5     Reflexes:  Right Left   Bicep 2+ 2+   Tricep 2+ 2+   BrRad 2+ 2+   Knee 2+ 2+   Ankle 0 0    Pathological Reflexes: Babinski: flexor response bilaterally Hoffman: absent bilaterally Troemner: absent bilaterally Sensation: Pinprick: Intact in all  extremities Vibration: 6 seconds in bilateral great toes, otherwise intact Proprioception: Intact in bilateral great toes Coordination: Intact finger-to- nose-finger bilaterally. Romberg negative. Gait: Able to rise  from chair with arms crossed unassisted. Normal, narrow-based gait.  Lab and Test Review: Internal labs: 12/31/22: HbA1c: 9.0 Lipid panel: tChol 144, LDL 83, TG 87  Vit D (11/14/21): 32.6 (wnl) B12 (04/20/20): 297  Imaging: Echocardiogram (09/23/22):  1. Left ventricular ejection fraction, by estimation, is 55 to 60%. The  left ventricle has normal function. The left ventricle has no regional  wall motion abnormalities. There is moderate left ventricular hypertrophy.  Left ventricular diastolic parameters are consistent with Grade I diastolic dysfunction (impaired  relaxation).   2. Right ventricular systolic function is normal. The right ventricular  size is normal. Tricuspid regurgitation signal is inadequate for assessing  PA pressure.   3. The mitral valve is normal in structure. Trivial mitral valve  regurgitation. No evidence of mitral stenosis.   4. The aortic valve is normal in structure. Aortic valve regurgitation is  not visualized. No aortic stenosis is present.   MRI lumbar spine wo contrast (10/16/22): FINDINGS: Segmentation: 5 lumbar type vertebral bodies as numbered previously.   Alignment:  Normal   Vertebrae:  No fracture or focal bone lesion.   Conus medullaris and cauda equina: Conus extends to the L1 level. Conus and cauda equina appear normal.   Paraspinal and other soft tissues: Negative   Disc levels:   No abnormality from T12-L1 through L2-3.   L3-4: Desiccation and mild bulging of the disc. Mild narrowing of the lateral recesses but no likely neural compression. Slight worsening since 2018.   L4-5: Desiccation and mild bulging of the disc. Mild facet and ligamentous hypertrophy. Mild stenosis of the lateral recesses and proximal  foramina but no definite neural compression. Similar appearance to the prior exam.   L5-S1: Desiccation and bulging of the disc slightly more prominent towards the right. Mild facet and ligamentous hypertrophy. Mild narrowing of the right lateral recess but no visible neural compression. Similar appearance to the prior exam.   IMPRESSION: 1. L3-4: Disc bulge. Mild narrowing of the lateral recesses but no likely neural compression. Slight worsening since 2018. 2. L4-5: Disc bulge. Mild facet and ligamentous hypertrophy. Mild stenosis of the lateral recesses and proximal foramina but no definite neural compression. Similar appearance to the prior exam. 3. L5-S1: Disc bulge slightly more prominent towards the right. Mild facet and ligamentous hypertrophy. Mild narrowing of the right lateral recess but no visible neural compression. Similar appearance to the prior exam.  EMG (12/17/22): NCV & EMG Findings: Extensive electrodiagnostic evaluation of bilateral lower limbs shows: Bilateral sural and superficial peroneal/fibular sensory responses are absent. Bilateral peroneal/fibular (EDB) and tibial (AH) motor responses are absent. Bilateral peroneal/fibular (TA) motor responses are within normal limits. Bilateral H reflexes are absent. Chronic motor axon loss changes without accompanying active denervation changes are seen in bilateral tibialis anterior, medial head of gastrocnemius, and flexor digitorum longus muscles.   Impression: This is an abnormal study. 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 No definitive electrodiagnostic evidence of a left or right lumbosacral (L3-S1) motor radiculopathy.  ASSESSMENT: Bradley Mckenzie. is a 58 y.o. male who presents for evaluation of numbness in bilateral feet. He has a relevant medical history of DM, HTN c/b cardiomyopathy,  OSA, COPD, CKD, degenerative disk disease. His neurological examination is pertinent for vibration sensation loss in bilateral great toes, but otherwise normal. Available diagnostic data is significant for HbA1c of 9.0 and B12 in 2022 that was  borderline low. He is not currently taking B12. 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 EMG of bilateral lower limbs on 12/17/22 showed evidence of an axonal large fiber neuropathy, moderate in degree electrically. His known risk factors include poorly controlled DM and possible B12 deficiency. I will send labs to look for other treatable causes. hTTR amyloidosis is a thought given he is AA with neuropathy and hx of cardiomyopathy, however, he does not have significant autonomic symptoms and per latest echo, heart function looked okay.  PLAN: -Blood work: B1, B12, IFE, kappa/lambda light chains -No current significant autonomic symptoms and cardiomyopathy does not seem significant per latest echo, but may consider hTTR amyloidosis testing in the future if these become more concerning -Alpha lipoic acid 600 mg once or twice daily -Discussed importance of good DM control  -Return to clinic in 6 months  The impression above as well as the plan as outlined below were extensively discussed with the patient who voiced understanding. All questions were answered to their satisfaction.  When available, results of the above investigations and possible further recommendations will be communicated to the patient via telephone/MyChart. Patient to call office if not contacted after expected testing turnaround time.   Total time spent reviewing records, interview, history/exam, documentation, and coordination of care on day of encounter:  40 min   Thank you for allowing me to participate in patient's care.  If I can answer any additional questions, I would be pleased to do so.  Venetia Potters, MD   CC: Melvenia Manus BRAVO, MD [address] [place] [address]  CC: Referring provider: Hilma Hastings, PA-C [address] [place],  [place] [address]

## 2023-02-11 ENCOUNTER — Encounter: Payer: Self-pay | Admitting: Internal Medicine

## 2023-02-11 NOTE — Assessment & Plan Note (Addendum)
Onset around 2017  - 05/10/2022  After extensive coaching inhaler device,  effectiveness =    25% improved to 755 (Ti too short) > try symbicort 80 2bid and max gerd rx  - FENO 05/10/2022  = 47 on wixella 250  - 05/10/2022  Walked on RA  x  one   lap(s) =  approx 150  ft  @ slow pace, stopped due to tired> sob with lowest 02 sats 92%   - 09/30/2022  After extensive coaching inhaler device,  effectiveness =    80%  - 09/30/2022   Walked on RA  x  3  lap(s) =  approx 450  ft  @ nl  pace, stopped due to end of study  with lowest 02 sats 94%   - 10/04/22  pfts p symbicort wnl  x for ERV 6% at wt 283 - Allergy screen 11/08/2022 >  Eos 0.3 /  IgE 122  done during flare of cough  with psueudohweeze on exam -  02/10/2023  d/c spiriva / try saba pre ex prn   He does not have significant copd - his daily doe is much more likely due to obesity / conditioning than  EIA but the way to tell the difference is try saba pre ex or alternate days  Continue symbicort 160 for now bid, no need for budesonide neb   F/ q 6 m, sooner prn

## 2023-02-11 NOTE — Assessment & Plan Note (Signed)
-   10/04/22  pfts  ERV 6% at wt 283  Body mass index is 42.68 kg/m.  -  trending up again  Lab Results  Component Value Date   TSH 1.140 11/22/2020    Contributing to doe and risk of GERD and risk of dvt/ PE >>>   reviewed the need and the process to achieve and maintain neg calorie balance > defer f/u primary care including intermittently monitoring thyroid status            Each maintenance medication was reviewed in detail including emphasizing most importantly the difference between maintenance and prns and under what circumstances the prns are to be triggered using an action plan format where appropriate.  Total time for H and P, chart review, counseling, reviewing hfa device(s) and generating customized AVS unique to this office visit / same day charting = 21 min

## 2023-02-19 ENCOUNTER — Other Ambulatory Visit: Payer: Medicare HMO

## 2023-02-19 ENCOUNTER — Encounter: Payer: Self-pay | Admitting: Neurology

## 2023-02-19 ENCOUNTER — Ambulatory Visit: Payer: Medicare HMO | Admitting: Neurology

## 2023-02-19 VITALS — BP 125/71 | HR 99 | Ht 69.0 in | Wt 285.0 lb

## 2023-02-19 DIAGNOSIS — E118 Type 2 diabetes mellitus with unspecified complications: Secondary | ICD-10-CM | POA: Diagnosis not present

## 2023-02-19 DIAGNOSIS — G629 Polyneuropathy, unspecified: Secondary | ICD-10-CM

## 2023-02-19 DIAGNOSIS — I429 Cardiomyopathy, unspecified: Secondary | ICD-10-CM | POA: Diagnosis not present

## 2023-02-19 NOTE — Patient Instructions (Addendum)
 I saw you today for numbness in your feet. This is due to nerve damage in your feet (called neuropathy). This is likely due to diabetes.  I would like to look for other treatable causes with blood work today. I will be touch when I have your results.  Alpha lipoic acid 600mg  daily has some research data suggesting it helps with nerve health. No major side effects other than <1% of people report upset stomach. This can be taken twice per day (1200mg  daily) if no relief obtained. You can buy this over the counter or online.   Recommend the following measures that may provide some symptomatic benefit for muscle twitching and/or cramps: - Adequate oral clear fluid intake to maintain optimal hydration (about 2.5 liters, or around 8-10 glasses per day) Avoidance of caffeine Trial of DIET tonic water: About 1 glass, up to 6 times daily Magnesium  oxide up to 400 mg by mouth twice daily, as needed (over the counter) Gentle muscle stretching routine, especially before bedtime  I will see you back in clinic in 6 months. Please let me know if you have any questions or concerns in the meantime.  The physicians and staff at [facility] are committed to providing excellent care. You may receive a survey requesting feedback about your experience at our office. We strive to receive very good responses to the survey questions. If you feel that your experience would prevent you from giving the office a very good  response, please contact our office to try to remedy the situation. We may be reached at [phone]. Thank you for taking the time out of your busy day to complete the survey.  Venetia Potters, MD [facility]

## 2023-02-25 ENCOUNTER — Encounter: Payer: Self-pay | Admitting: Neurology

## 2023-02-25 LAB — KAPPA/LAMBDA LIGHT CHAINS
Kappa free light chain: 32.2 mg/L — ABNORMAL HIGH (ref 3.3–19.4)
Kappa:Lambda Ratio: 1.59 (ref 0.26–1.65)
Lambda Free Lght Chn: 20.3 mg/L (ref 5.7–26.3)

## 2023-02-25 LAB — VITAMIN B12: Vitamin B-12: 296 pg/mL (ref 200–1100)

## 2023-02-25 LAB — IMMUNOFIXATION ELECTROPHORESIS
IgG (Immunoglobin G), Serum: 1225 mg/dL (ref 600–1640)
IgM, Serum: 72 mg/dL (ref 50–300)
Immunoglobulin A: 454 mg/dL — ABNORMAL HIGH (ref 47–310)

## 2023-02-25 LAB — VITAMIN B1: Vitamin B1 (Thiamine): 10 nmol/L (ref 8–30)

## 2023-02-28 ENCOUNTER — Other Ambulatory Visit: Payer: Self-pay

## 2023-02-28 NOTE — Progress Notes (Signed)
   Telephone Visit- Progress Note: Referring Physician:  Billie Lade, MD [address] [place],  [place] [address]  Primary Physician:  Billie Lade, MD  This visit was performed via telephone.  Patient location: home Provider location: office  I spent a total of 10 minutes non-face-to-face activities for this visit on the date of this encounter including review of current clinical condition and response to treatment.    Patient has given verbal consent to this telephone visits and we reviewed the limitations of a telephone visit. Patient wishes to proceed.    Chief Complaint:  follow up  History of Present Illness: history of heart failure, HTN, hypertensive cardiomyopathy, OSA, COPD, CKD 2, and DM.   He did phone visit with me on 12/24/22 to review his EMG.   He has known lumbar spondylosis L3-S1 with DDD. He has mild lateral recess stenosis bilaterally at L3-L4 and L4-L5 with mild lateral recess stenosis on right at L5-S1. No significant compression noted.    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 No lumbar radiculopathy.    LBP is likely due to underlying spondylosis and DDD. Numbness and tingling in legs is likely from neuropathy.   He was referred to neurology [facility]) for his neuropathy. He was sent lumbar HEP for his back.   He is here for follow up.  He continues with constant numbness in left heel. The intermittent numbness/tingling in both legs that was more in posterior thighs is better. He still has intermittent LBP as well. His LBP is tolerable. He is doing HEP that I sent him.   Dr. Loleta Chance did labs and his B12 was borderline low. He recommended he take a B12 supplement. He has not started this yet.   Conservative measures:  Physical therapy: he did PT in 1992 and 2019, doing HEP that I sent him.  Multimodal medical therapy including regular antiinflammatories: mobic,  robaxin, motrin, naprosyn  Injections: No epidural steroid injections  Past Surgery: No spinal surgery  Bradley Mckenzie. has no symptoms of cervical myelopathy.   Exam: No exam done as this was a telephone encounter.     Imaging: none  Assessment and Plan: Bradley Mckenzie is a pleasant 58 y.o. male with a history of chronic LBP x years.     He continues with constant numbness in left heel. He still has intermittent LBP as well. His LBP is tolerable.    He has known lumbar spondylosis L3-S1 with DDD. He has mild lateral recess stenosis bilaterally at L3-L4 and L4-L5 with mild lateral recess stenosis on right at L5-S1. No significant compression noted.    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 No lumbar radiculopathy.    LBP is likely due to underlying spondylosis and DDD. Numbness and tingling in legs is likely from neuropathy.    Treatment options discussed with patient and following plan made:   - Continue with treatment for neuropathy per Dr. Loleta Chance. He still plans to get vitamin B12 and alpha lipoic acid supplements.  - Continue HEP for lower back that I sent him.  - If LBP gets worse, can revisit formal PT. Can also consider injections, but he hates needles.  - Follow up with me in 3 months and prn.   Drake Leach PA-C Neurosurgery

## 2023-03-05 ENCOUNTER — Encounter: Payer: Self-pay | Admitting: Orthopedic Surgery

## 2023-03-05 ENCOUNTER — Other Ambulatory Visit: Payer: Self-pay

## 2023-03-05 ENCOUNTER — Ambulatory Visit (INDEPENDENT_AMBULATORY_CARE_PROVIDER_SITE_OTHER): Payer: Medicare HMO | Admitting: Orthopedic Surgery

## 2023-03-05 DIAGNOSIS — M48061 Spinal stenosis, lumbar region without neurogenic claudication: Secondary | ICD-10-CM | POA: Diagnosis not present

## 2023-03-05 DIAGNOSIS — G629 Polyneuropathy, unspecified: Secondary | ICD-10-CM

## 2023-03-05 DIAGNOSIS — M5136 Other intervertebral disc degeneration, lumbar region with discogenic back pain only: Secondary | ICD-10-CM

## 2023-03-05 DIAGNOSIS — M47816 Spondylosis without myelopathy or radiculopathy, lumbar region: Secondary | ICD-10-CM

## 2023-03-06 ENCOUNTER — Other Ambulatory Visit: Payer: Self-pay | Admitting: Internal Medicine

## 2023-03-07 ENCOUNTER — Ambulatory Visit (INDEPENDENT_AMBULATORY_CARE_PROVIDER_SITE_OTHER): Payer: Medicare HMO | Admitting: Internal Medicine

## 2023-03-07 ENCOUNTER — Encounter: Payer: Self-pay | Admitting: Internal Medicine

## 2023-03-07 ENCOUNTER — Other Ambulatory Visit: Payer: Self-pay

## 2023-03-07 ENCOUNTER — Other Ambulatory Visit: Payer: Self-pay | Admitting: Internal Medicine

## 2023-03-07 VITALS — BP 146/82 | HR 83 | Ht 69.0 in | Wt 287.2 lb

## 2023-03-07 DIAGNOSIS — E118 Type 2 diabetes mellitus with unspecified complications: Secondary | ICD-10-CM

## 2023-03-07 DIAGNOSIS — I10 Essential (primary) hypertension: Secondary | ICD-10-CM | POA: Diagnosis not present

## 2023-03-07 DIAGNOSIS — E782 Mixed hyperlipidemia: Secondary | ICD-10-CM | POA: Diagnosis not present

## 2023-03-07 DIAGNOSIS — Z794 Long term (current) use of insulin: Secondary | ICD-10-CM | POA: Diagnosis not present

## 2023-03-07 DIAGNOSIS — J4541 Moderate persistent asthma with (acute) exacerbation: Secondary | ICD-10-CM | POA: Diagnosis not present

## 2023-03-07 DIAGNOSIS — J301 Allergic rhinitis due to pollen: Secondary | ICD-10-CM | POA: Diagnosis not present

## 2023-03-07 MED ORDER — DEXCOM G7 RECEIVER DEVI
1.0000 | 0 refills | Status: DC
  Filled 2023-03-07: qty 1, 90d supply, fill #0

## 2023-03-07 MED ORDER — CETIRIZINE HCL 10 MG PO TABS: 10.0000 mg | ORAL_TABLET | Freq: Every day | ORAL | 1 refills | Status: AC

## 2023-03-07 MED ORDER — MONTELUKAST SODIUM 10 MG PO TABS: 10.0000 mg | ORAL_TABLET | Freq: Every day | ORAL | 3 refills | Status: DC

## 2023-03-07 MED ORDER — DEXCOM G7 SENSOR MISC
1.0000 | 3 refills | Status: DC
  Filled 2023-03-07: qty 2, 28d supply, fill #0

## 2023-03-07 MED ORDER — ATORVASTATIN CALCIUM 20 MG PO TABS: 20.0000 mg | ORAL_TABLET | Freq: Every day | ORAL | 3 refills | Status: DC

## 2023-03-07 MED ORDER — DEXCOM G7 RECEIVER DEVI: 1.0000 | 0 refills | Status: AC

## 2023-03-07 MED ORDER — METFORMIN HCL ER 500 MG PO TB24
500.0000 mg | ORAL_TABLET | Freq: Two times a day (BID) | ORAL | 2 refills | Status: DC
  Filled 2023-03-07: qty 60, 30d supply, fill #0

## 2023-03-07 MED ORDER — DEXCOM G7 SENSOR MISC: 1.0000 | 3 refills | Status: AC

## 2023-03-07 MED ORDER — BUDESONIDE-FORMOTEROL FUMARATE 160-4.5 MCG/ACT IN AERO: 2.0000 | INHALATION_SPRAY | Freq: Two times a day (BID) | RESPIRATORY_TRACT | 6 refills | Status: DC

## 2023-03-07 MED ORDER — METFORMIN HCL ER 500 MG PO TB24: 500.0000 mg | ORAL_TABLET | Freq: Two times a day (BID) | ORAL | 2 refills | Status: DC

## 2023-03-07 NOTE — Patient Instructions (Signed)
It was a pleasure to see you today.  Thank you for giving [place] the opportunity to be involved in your care.  Below is a brief recap of your visit and next steps.  We will plan to see you again in 4 weeks.  Summary Dexcom prescribed today Switch to extended release metformin Will refer to clinical pharmacy Follow up in 4 weeks

## 2023-03-07 NOTE — Progress Notes (Signed)
 Established Patient Office Visit  Subjective   Patient ID: Bradley Mckenzie., male    DOB: March 11, 1965  Age: 58 y.o. MRN: [id number]  Chief Complaint  Patient presents with   Diabetes    Three month follow up, patient can not afford the ozempic. Patient would like to try the dexcom   [name] returns to care today for follow-up.  He was last evaluated by me in November 2024 as a new patient presenting to establish care.  His acute concern at that time was a large, fixed mass in the right axilla.  Soft tissue ultrasound was ordered to better characterization and ultimately a CT chest was pursued.  In the interim, he has been evaluated by neurology, neurosurgery, pulmonology, and cardiology.  There have otherwise been no acute interval events.  Today he reports feeling well and is asymptomatic.  He expressed frustration related to diabetes, which is currently uncontrolled with A1c increasing to 9.0 from 7.8 previously.  He states that he has been taking his medications as prescribed and his diet has not changed.  Not understand why his A1c has increased.  Additionally, [name] states that he has been off of Ozempic since early January due to cost.  He also reports that he will be switching to [place] when he runs out of [place] because he has been receiving [place] through a charity.  He is also interested in obtaining a Dexcom.  Past Medical History:  Diagnosis Date   Allergy    Asthma    Cardiomyopathy (HCC) 2016   a. 01/2014 Echo: EF 25-30%;  b. ? ischemic vs non-ischemic.  He's never had an ischemic eval.   Chronic combined systolic (congestive) and diastolic (congestive) heart failure (HCC)    a. 01/2014 Echo: EF 25-30%. b. Echo 03/2015: Improved EF of 40-45%   COPD (chronic obstructive pulmonary disease) (HCC)    Depression    Diabetes mellitus without complication (HCC) 2018   started med therapy approx 2018   Hyperlipidemia    Hypertension    Hypertensive cardiomyopathy (HCC)  06/30/2015   Hypertensive heart disease    a. Since his 72's.   Morbid obesity (HCC)    Obstructive sleep apnea    a. Has not used CPAP since ~ 2012.   Sleep apnea    Past Surgical History:  Procedure Laterality Date   HERNIA REPAIR  2004   Social History   Tobacco Use   Smoking status: Never   Smokeless tobacco: Never   Tobacco comments:    Quit in his 20's.  Vaping Use   Vaping status: Never Used  Substance Use Topics   Alcohol use: Not Currently    Comment: rarely alcohol use at special events   Drug use: Not Currently    Comment: Used marijuana in his teens.   Family History  Problem Relation Age of Onset   Heart disease Mother        alive & well.   Heart attack Father 49       died @ 74 of cancer.   Diabetes Father    Cancer Father        "in his stomach"   Cancer Sister    Cancer Paternal Uncle        strong FH malignancy multiple relavies    No Known Allergies  Review of Systems  Constitutional:  Negative for chills and fever.  HENT:  Negative for sore throat.   Respiratory:  Negative for cough and shortness  of breath.   Cardiovascular:  Negative for chest pain, palpitations and leg swelling.  Gastrointestinal:  Negative for abdominal pain, blood in stool, constipation, diarrhea, nausea and vomiting.  Genitourinary:  Negative for dysuria and hematuria.  Musculoskeletal:  Negative for myalgias.  Skin:  Negative for itching and rash.  Neurological:  Negative for dizziness and headaches.  Psychiatric/Behavioral:  Negative for depression and suicidal ideas.      Objective:     BP (!) 146/82 (BP Location: Left Arm, Patient Position: Sitting, Cuff Size: Large)   Pulse 83   Ht 5\' 9"  (1.753 m)   Wt 287 lb 3.2 oz (130.3 kg)   SpO2 94%   BMI 42.41 kg/m  BP Readings from Last 3 Encounters:  03/07/23 (!) 146/82  02/19/23 125/71  02/10/23 (!) 152/82   Physical Exam Vitals reviewed.  Constitutional:      General: He is not in acute distress.     Appearance: Normal appearance. He is obese. He is not ill-appearing.  HENT:     Head: Normocephalic and atraumatic.     Right Ear: External ear normal.     Left Ear: External ear normal.     Nose: Nose normal. No congestion or rhinorrhea.     Mouth/Throat:     Mouth: Mucous membranes are moist.     Pharynx: Oropharynx is clear.  Eyes:     General: No scleral icterus.    Extraocular Movements: Extraocular movements intact.     Conjunctiva/sclera: Conjunctivae normal.     Pupils: Pupils are equal, round, and reactive to light.  Cardiovascular:     Rate and Rhythm: Normal rate and regular rhythm.     Pulses: Normal pulses.     Heart sounds: Normal heart sounds. No murmur heard. Pulmonary:     Effort: Pulmonary effort is normal.     Breath sounds: Normal breath sounds. No wheezing, rhonchi or rales.  Abdominal:     General: Abdomen is flat. Bowel sounds are normal. There is no distension.     Palpations: Abdomen is soft.     Tenderness: There is no abdominal tenderness.  Musculoskeletal:        General: No swelling or deformity. Normal range of motion.     Cervical back: Normal range of motion.  Skin:    General: Skin is warm and dry.     Capillary Refill: Capillary refill takes less than 2 seconds.  Neurological:     General: No focal deficit present.     Mental Status: He is alert and oriented to person, place, and time.     Motor: No weakness.  Psychiatric:        Mood and Affect: Mood normal.        Behavior: Behavior normal.        Thought Content: Thought content normal.   Last CBC Lab Results  Component Value Date   WBC 9.9 11/08/2022   HGB 14.2 11/08/2022   HCT 44.8 11/08/2022   MCV 85 11/08/2022   MCH 27.0 11/08/2022   RDW 14.5 11/08/2022   PLT 235 11/08/2022   Last metabolic panel Lab Results  Component Value Date   GLUCOSE 131 (H) 12/31/2022   NA 141 12/31/2022   K 4.0 12/31/2022   CL 103 12/31/2022   CO2 27 12/31/2022   BUN 15 12/31/2022    CREATININE 1.83 (H) 12/31/2022   GFRNONAA 43 (L) 12/31/2022   CALCIUM 9.5 12/31/2022   PHOS 6.0 (H) 09/02/2015   PROT  8.0 08/30/2022   ALBUMIN 4.6 08/30/2022   LABGLOB 3.4 08/30/2022   AGRATIO 1.4 04/18/2022   BILITOT 0.2 08/30/2022   ALKPHOS 116 08/30/2022   AST 20 08/30/2022   ALT 33 08/30/2022   ANIONGAP 11 12/31/2022   Last lipids Lab Results  Component Value Date   CHOL 144 12/31/2022   HDL 44 12/31/2022   LDLCALC 83 12/31/2022   TRIG 87 12/31/2022   CHOLHDL 3.3 12/31/2022   Last hemoglobin A1c Lab Results  Component Value Date   HGBA1C 9.0 (H) 12/31/2022   Last thyroid functions Lab Results  Component Value Date   TSH 1.140 11/22/2020   Last vitamin D Lab Results  Component Value Date   VD25OH 32.6 11/14/2021   Last vitamin B12 and Folate Lab Results  Component Value Date   VITAMINB12 296 02/19/2023   The 10-year ASCVD risk score (Arnett DK, et al., 2019) is: 24.5%    Assessment & Plan:   Problem List Items Addressed This Visit       Essential hypertension (Chronic)   BP is elevated today.  Previously well-controlled on currently prescribed antihypertensive regimen.  He states that he did not take his medication prior to today's appointment.  Will monitor BP at follow-up in 4 weeks.      Type 2 diabetes mellitus with complication (HCC) - Primary (Chronic)   A1c 9.0 on labs from December, increased from 7.8 previously.  His most recently prescribed medication regimen includes Ozempic 2 mg weekly, Lantus 31 units nightly, metformin 500 mg twice daily, and Farxiga 10 mg daily.  He is frustrated that his A1c has increased despite adhering to medications as prescribed and monitoring his diet.  He has been off of Ozempic since early January due to cost and states that he will soon switch to [place] from [place]. -Increasing A1c is curious if he has adhere to medications and not changed his diet significantly.  Given difficulties with reliably receiving  medications such as Ozempic and SGLT2 inhibitors, particularly in the setting of a rising A1c, will refer to clinical pharmacy for assistance with medications and disease management. -Dexcom ordered today.  He has also been provided with blood sugar log to document readings to bring to his next appointment. -Switch to extended release metformin.  Currently prescribed metformin 500 mg twice daily.  He describes diarrhea with higher doses. -He describes taking Lantus in the middle of the day.  I have asked him to take Lantus in the evening. -Follow-up in 4 weeks for diabetes management      Return in about 4 weeks (around 04/04/2023) for DM management.   Billie Lade, MD

## 2023-03-08 NOTE — Assessment & Plan Note (Signed)
 A1c 9.0 on labs from December, increased from 7.8 previously.  His most recently prescribed medication regimen includes Ozempic 2 mg weekly, Lantus 31 units nightly, metformin 500 mg twice daily, and Farxiga 10 mg daily.  He is frustrated that his A1c has increased despite adhering to medications as prescribed and monitoring his diet.  He has been off of Ozempic since early January due to cost and states that he will soon switch to [place] from [place]. -Increasing A1c is curious if he has adhere to medications and not changed his diet significantly.  Given difficulties with reliably receiving medications such as Ozempic and SGLT2 inhibitors, particularly in the setting of a rising A1c, will refer to clinical pharmacy for assistance with medications and disease management. -Dexcom ordered today.  He has also been provided with blood sugar log to document readings to bring to his next appointment. -Switch to extended release metformin.  Currently prescribed metformin 500 mg twice daily.  He describes diarrhea with higher doses. -He describes taking Lantus in the middle of the day.  I have asked him to take Lantus in the evening. -Follow-up in 4 weeks for diabetes management

## 2023-03-08 NOTE — Assessment & Plan Note (Signed)
 BP is elevated today.  Previously well-controlled on currently prescribed antihypertensive regimen.  He states that he did not take his medication prior to today's appointment.  Will monitor BP at follow-up in 4 weeks.

## 2023-03-09 ENCOUNTER — Other Ambulatory Visit: Payer: Self-pay

## 2023-03-11 ENCOUNTER — Other Ambulatory Visit: Payer: Self-pay

## 2023-03-11 ENCOUNTER — Other Ambulatory Visit: Payer: Self-pay | Admitting: Physician Assistant

## 2023-03-12 ENCOUNTER — Other Ambulatory Visit: Payer: Self-pay

## 2023-03-12 MED ORDER — ACCU-CHEK SOFTCLIX LANCETS MISC
1 refills | Status: AC
  Filled 2023-03-12: qty 100, 33d supply, fill #0

## 2023-03-12 NOTE — Telephone Encounter (Signed)
 Requested Prescriptions  Pending Prescriptions Disp Refills   Accu-Chek Softclix Lancets lancets 100 each 1    Sig: Use to check blood sugar three times daily     Endocrinology: Diabetes - Testing Supplies Passed - 03/12/2023 12:50 PM      Passed - Valid encounter within last 12 months    Recent Outpatient Visits           6 months ago Type 2 diabetes mellitus with complication [facility])   [facility] [place], [place], PA-C   6 months ago Essential hypertension   [facility] [place], [place], PA-C   9 months ago Essential hypertension   [place] [facility] [place], [place], [place]   11 months ago Essential hypertension   [place] [facility] [place], [place], [place]

## 2023-03-19 ENCOUNTER — Telehealth: Payer: Self-pay

## 2023-03-19 NOTE — Progress Notes (Signed)
 Care Guide Pharmacy Note  03/19/2023 Name: Bradley Mckenzie. MRN: [id number] DOB: [birth year]  Referred By: Billie Lade, MD Reason for referral: Care Coordination (Outreach to schedule with Pharm d )   Bradley Mckenzie. is a 58 y.o. year old male who is a primary care patient of Billie Lade, MD.  Bradley Mckenzie. was referred to the pharmacist for assistance related to: DMII  An unsuccessful telephone outreach was attempted today to contact the patient who was referred to the pharmacy team for assistance with medication assistance. Additional attempts will be made to contact the patient.  Penne Lash , RMA     [facility]  [facility] Guide  Direct Dial: [phone]  Website: [email or web address]

## 2023-03-24 ENCOUNTER — Telehealth: Payer: Self-pay | Admitting: Family

## 2023-03-24 NOTE — Telephone Encounter (Signed)
 Pt confirmed appt for 03/25/23

## 2023-03-24 NOTE — Progress Notes (Unsigned)
 Advanced Heart Failure Clinic Note    PCP: Christel Mormon, MD (last seen 02/25) HF Cardiologist: Clarisa Kindred NP   Chief Complaint: shortness of breath  HPI:  Bradley Mckenzie is a 58 y/o male who has a history of DMII, HTN, lumbar spondylosis, obstructive sleep apnea, depression, hyperlipidemia, asthma, CKD, morbid obesity and chronic systolic heart failure. Remote tobacco exposure.   Was in the ED 12/09/21 due to left foot numbness. Was in the ED 12/18/21 due to LBP radiating down left leg.  He returns today for a HF follow-up with a chief complaint of shortness of breath with exertion with little exertion. Has fatigue, dry cough, body aches, occasional palpitations, dizziness with coughing and pedal edema along with this. Denies fever, chest pain, abdominal distention or difficulty sleeping. He used his nebulizer last night because he felt short of breath. Is experiencing neuropathy in his legs and is wondering if it's the farxiga?  Has been planning to finish out his farxiga before starting jardiance. Glucose has been running in the 200's.   Did not increase his hydralazine because at last visit, he had not taken any of his medications prior to the visit.   Echo 03/27/15: EF 40-45% with mild MR Echo 09/02/15: EF of 30-35%, mild mitral regurgitation and no aortic stenosis. Echo 10/28/2018: EF of 60-65% along with mildly elevated PA pressure.  Echo 09/23/22: EF 55-60% with moderate LVH, Grade I DD  ROS: All systems negative except as listed in HPI, PMH and Problem List.  SH:  Social History   Socioeconomic History   Marital status: Married    Spouse name: Not on file   Number of children: 2   Years of education: 12   Highest education level: GED or equivalent  Occupational History   Occupation: unemployed    Comment: hard time getting disability, denied medicaid   Tobacco Use   Smoking status: Never   Smokeless tobacco: Never   Tobacco comments:    Quit in his 89's.  Vaping Use    Vaping status: Never Used  Substance and Sexual Activity   Alcohol use: Not Currently    Comment: rarely alcohol use at special events   Drug use: Not Currently    Comment: Used marijuana in his teens.   Sexual activity: Not Currently  Other Topics Concern   Not on file  Social History Narrative   Lives in [place] with his wife. Wife no longer working; now on disability. Have stable housing at group home that his wife used to work at. Sometimes struggles with transportation but not desperate. Wife has working car, his doesn't work. Had food stamps but cut off after 3 months. Wasn't eligible after that bc not able to work. Has been denied twice from disability. He was not able to obtain medicaid   Social Drivers of Health   Financial Resource Strain: Low Risk  (11/28/2021)   Overall Financial Resource Strain (CARDIA)    Difficulty of Paying Living Expenses: Not hard at all  Food Insecurity: No Food Insecurity (05/17/2021)   Hunger Vital Sign    Worried About Running Out of Food in the Last Year: Never true    Ran Out of Food in the Last Year: Never true  Transportation Needs: No Transportation Needs (05/17/2021)   PRAPARE - Administrator, Civil Service (Medical): No    Lack of Transportation (Non-Medical): No  Physical Activity: Inactive (11/28/2021)   Exercise Vital Sign    Days of Exercise per Week: 0  days    Minutes of Exercise per Session: 0 min  Stress: No Stress Concern Present (11/28/2021)   [employer] of Occupational Health - Occupational Stress Questionnaire    Feeling of Stress : Only a little  Social Connections: Moderately Integrated (11/28/2021)   Social Connection and Isolation Panel [NHANES]    Frequency of Communication with Friends and Family: More than three times a week    Frequency of Social Gatherings with Friends and Family: Once a week    Attends Religious Services: 1 to 4 times per year    Active Member of [employer] or Organizations: No     Attends Banker Meetings: Never    Marital Status: Married  Catering manager Violence: Not At Risk (11/28/2021)   Humiliation, Afraid, Rape, and Kick questionnaire    Fear of Current or Ex-Partner: No    Emotionally Abused: No    Physically Abused: No    Sexually Abused: No    FH:  Family History  Problem Relation Age of Onset   Heart disease Mother        alive & well.   Heart attack Father 69       died @ 74 of cancer.   Diabetes Father    Cancer Father        "in his stomach"   Cancer Sister    Cancer Paternal Uncle        strong FH malignancy multiple relavies     Past Medical History:  Diagnosis Date   Allergy    Asthma    Cardiomyopathy (HCC) 2016   a. 01/2014 Echo: EF 25-30%;  b. ? ischemic vs non-ischemic.  He's never had an ischemic eval.   Chronic combined systolic (congestive) and diastolic (congestive) heart failure (HCC)    a. 01/2014 Echo: EF 25-30%. b. Echo 03/2015: Improved EF of 40-45%   COPD (chronic obstructive pulmonary disease) (HCC)    Depression    Diabetes mellitus without complication (HCC) 2018   started med therapy approx 2018   Hyperlipidemia    Hypertension    Hypertensive cardiomyopathy (HCC) 06/30/2015   Hypertensive heart disease    a. Since his 54's.   Morbid obesity (HCC)    Obstructive sleep apnea    a. Has not used CPAP since ~ 2012.   Sleep apnea     Current Outpatient Medications  Medication Sig Dispense Refill   Accu-Chek Softclix Lancets lancets Use to check blood sugar three times daily 100 each 1   albuterol (PROAIR HFA) 108 (90 Base) MCG/ACT inhaler Inhale 2 puffs into the lungs every 6 (six) hours as needed for wheezing or shortness of breath. 6.7 g 4   atorvastatin (LIPITOR) 20 MG tablet Take 1 tablet (20 mg total) by mouth daily. 90 tablet 3   Blood Glucose Monitoring Suppl (ACCU-CHEK GUIDE) w/Device KIT 1 each by Does not apply route 3 (three) times daily. 1 kit 12   Blood Glucose Monitoring Suppl DEVI 1  each by Does not apply route in the morning, at noon, and at bedtime. May substitute to any manufacturer covered by patient's insurance. 1 each 0   Blood Pressure Monitoring (BLOOD PRESSURE CUFF) MISC USE AS DIRECTED ONCE DAILY AT 12 NOON. 1 each 0   budesonide (PULMICORT) 0.5 MG/2ML nebulizer solution Take 2 mLs (0.5 mg total) by nebulization 2 (two) times daily as needed (shortness of breath or wheezing). 120 mL 3   budesonide-formoterol (SYMBICORT) 160-4.5 MCG/ACT inhaler Inhale 2 puffs into the  lungs 2 (two) times daily. 10.2 g 6   cetirizine (ZYRTEC) 10 MG tablet Take 1 tablet (10 mg total) by mouth daily. 90 tablet 1   Continuous Glucose Receiver (DEXCOM G7 RECEIVER) DEVI 1 each by Does not apply route continuous. 1 each 0   Continuous Glucose Sensor (DEXCOM G7 SENSOR) MISC 1 each by Does not apply route every 14 (fourteen) days. 1 each 3   dapagliflozin propanediol (FARXIGA) 10 MG TABS tablet Take 10 mg by mouth daily.     famotidine (PEPCID) 20 MG tablet Take 1 tablet (20 mg total) by mouth daily after supper. 30 tablet 11   glucose blood (ACCU-CHEK GUIDE) test strip Use as instructed 100 each 12   hydrALAZINE (APRESOLINE) 25 MG tablet Take 3 tablets (75 mg total) by mouth 2 (two) times daily. 180 tablet 1   insulin glargine (LANTUS) 100 UNIT/ML Solostar Pen Inject 31 Units into the skin daily. 30 mL 1   Insulin Pen Needle 30G X 5 MM MISC Use as directed daily. 100 each 6   Insulin Pen Needle 32G X 4 MM MISC Use as directed 100 each 11   ipratropium-albuterol (DUONEB) 0.5-2.5 (3) MG/3ML SOLN Take 3 mLs by nebulization every 4 (four) hours as needed. 180 mL 1   losartan (COZAAR) 100 MG tablet Take 1 tablet (100 mg total) by mouth daily. 90 tablet 4   metFORMIN (GLUCOPHAGE-XR) 500 MG 24 hr tablet Take 1 tablet (500 mg total) by mouth 2 (two) times daily with a meal. 60 tablet 2   montelukast (SINGULAIR) 10 MG tablet Take 1 tablet (10 mg total) by mouth at bedtime. 30 tablet 3   nebivolol  (BYSTOLIC) 5 MG tablet Take 1 tablet (5 mg total) by mouth daily. 90 tablet 3   pantoprazole (PROTONIX) 40 MG tablet Take 1 tablet (40 mg total) by mouth daily. Take 30-60 min before first meal of the day 30 tablet 2   potassium chloride SA (KLOR-CON M) 20 MEQ tablet Take 1 tablet (20 mEq total) by mouth 2 (two) times daily. 180 tablet 4   promethazine-dextromethorphan (PROMETHAZINE-DM) 6.25-15 MG/5ML syrup Take 5 mLs by mouth 4 (four) times daily as needed for cough. 240 mL 2   Rightest GL300 Lancets MISC USE AS DIRECTED. 100 each 0   Semaglutide, 2 MG/DOSE, (OZEMPIC, 2 MG/DOSE,) 8 MG/3ML SOPN Inject 2 mg into the skin once a week. (Patient not taking: Reported on 02/19/2023) 12 mL 3   Tiotropium Bromide Monohydrate (SPIRIVA RESPIMAT) 1.25 MCG/ACT AERS Inhale 2 puffs into the lungs daily. 4 g 5   Tiotropium Bromide Monohydrate (SPIRIVA RESPIMAT) 1.25 MCG/ACT AERS Inhale 2 puffs into the lungs daily.     torsemide (DEMADEX) 20 MG tablet Take 1 tablet (20 mg total) by mouth daily. May take an extra 20mg  tablet daily as needed for worsening shortness of breath, swelling, or weight gain. 30 tablet 3   No current facility-administered medications for this visit.   Vitals:   03/25/23 0949  BP: 137/70  Pulse: 100  SpO2: 97%  Weight: 285 lb 2 oz (129.3 kg)   Wt Readings from Last 3 Encounters:  03/25/23 285 lb 2 oz (129.3 kg)  03/07/23 287 lb 3.2 oz (130.3 kg)  02/19/23 285 lb (129.3 kg)   Lab Results  Component Value Date   CREATININE 1.83 (H) 12/31/2022   CREATININE 1.47 (H) 08/30/2022   CREATININE 1.68 (H) 04/18/2022     PHYSICAL EXAM:  General: Well appearing. No resp difficulty HEENT:  normal Neck: supple, no JVD Cor: Regular rhythm, rate. No rubs, gallops or murmurs Lungs: clear Abdomen: soft, nontender, nondistended. Extremities: no cyanosis, clubbing, rash, 1+ pitting edema bilateral lower legs Neuro: alert & oriented X 3. Moves all 4 extremities w/o difficulty. Affect  pleasant  ReDs reading: 27 %, normal    ASSESSMENT & PLAN:  1: Chronic HFpEF- - likely due to HTN and OSA - NYHA III Multifactorial with lung disease.   - euvolemic  - weight down 2 pounds from last visit here 2 months ago - Echo 03/27/15: EF 40-45% with mild MR - Echo 09/02/15: EF of 30-35%, mild mitral regurgitation and no aortic stenosis. - Echo 10/28/2018: EF of 60-65% along with mildly elevated PA pressure.  - Echo 09/23/22: EF 55-60% with moderate LVH, Grade I DD - ReDs 27% - continue farxiga 10mg  daily; only way to know if this is causing neuropathy would be stop it and then rechallenge; most likely neuropathy is due to his uncontrolled diabetes. He is going to finish out the farxiga and then switch it to jardiance and see if neuropathy improves - continue losartan 100mg  daily - continue bystolic 5mg  daily  - increase torsemide to 40 mg daily with extra 20 mg as needed.  - BMET in 1 week - previous spironolactone use caused gynecomastia; he would be willing to try epleronone but pharmD said it wasn't covered well - she will check into grant for him - Could consider Cardiomems to help with volume management - Discussed low salt food choices.    2: HTN - BP 137/70 - continue hydralazine 25 mg twice a day.; he says that he never increased it because he hadn't taken his meds day of last appt - BMET 12/31/22 reviewed and showed sodium 141, potassium 4.0, creatinine 1.83 and GFR 43 - BMET in 1 week  3: Diabetes- - A1c 12/31/22 reviewed and was 9.0% - continue metformin 500mg  BID - no longer on ozempic as insurance would not cover it  4: OSA  - Using CPAP every night.  - saw pulmonology Sherene Sires) 01/25  5: Viral syndrome- - fatigue, body aches, no fever - follow up w/ PCP if symptoms continue   Return in 1 month, sooner if needed.   Delma Freeze, FNP 03/24/23

## 2023-03-24 NOTE — Progress Notes (Unsigned)
 Care Guide Pharmacy Note  03/24/2023 Name: Bradley Mckenzie. MRN: [id number] DOB: Jun 11, 1965  Referred By: Billie Lade, MD Reason for referral: Care Coordination (Outreach to schedule with Pharm d )   Bradley Mckenzie. is a 58 y.o. year old male who is a primary care patient of Billie Lade, MD.  [name]. was referred to the pharmacist for assistance related to: DMII  A second unsuccessful telephone outreach was attempted today to contact the patient who was referred to the pharmacy team for assistance with medication assistance. Additional attempts will be made to contact the patient.  Penne Lash , RMA     [facility]  [facility] Guide  Direct Dial: [phone]  Website: [email or web address]

## 2023-03-25 ENCOUNTER — Ambulatory Visit: Payer: Medicare HMO | Attending: Family | Admitting: Family

## 2023-03-25 ENCOUNTER — Encounter: Payer: Self-pay | Admitting: Family

## 2023-03-25 ENCOUNTER — Other Ambulatory Visit (HOSPITAL_COMMUNITY): Payer: Self-pay

## 2023-03-25 ENCOUNTER — Other Ambulatory Visit: Payer: Self-pay | Admitting: Internal Medicine

## 2023-03-25 ENCOUNTER — Other Ambulatory Visit: Payer: Self-pay

## 2023-03-25 VITALS — BP 137/70 | HR 100 | Wt 285.1 lb

## 2023-03-25 DIAGNOSIS — Z794 Long term (current) use of insulin: Secondary | ICD-10-CM | POA: Diagnosis not present

## 2023-03-25 DIAGNOSIS — E785 Hyperlipidemia, unspecified: Secondary | ICD-10-CM | POA: Insufficient documentation

## 2023-03-25 DIAGNOSIS — M47896 Other spondylosis, lumbar region: Secondary | ICD-10-CM | POA: Diagnosis not present

## 2023-03-25 DIAGNOSIS — Z79899 Other long term (current) drug therapy: Secondary | ICD-10-CM | POA: Diagnosis not present

## 2023-03-25 DIAGNOSIS — E1140 Type 2 diabetes mellitus with diabetic neuropathy, unspecified: Secondary | ICD-10-CM | POA: Insufficient documentation

## 2023-03-25 DIAGNOSIS — I130 Hypertensive heart and chronic kidney disease with heart failure and stage 1 through stage 4 chronic kidney disease, or unspecified chronic kidney disease: Secondary | ICD-10-CM | POA: Insufficient documentation

## 2023-03-25 DIAGNOSIS — B349 Viral infection, unspecified: Secondary | ICD-10-CM | POA: Insufficient documentation

## 2023-03-25 DIAGNOSIS — I5032 Chronic diastolic (congestive) heart failure: Secondary | ICD-10-CM | POA: Diagnosis not present

## 2023-03-25 DIAGNOSIS — I10 Essential (primary) hypertension: Secondary | ICD-10-CM

## 2023-03-25 DIAGNOSIS — Z7984 Long term (current) use of oral hypoglycemic drugs: Secondary | ICD-10-CM | POA: Insufficient documentation

## 2023-03-25 DIAGNOSIS — J4489 Other specified chronic obstructive pulmonary disease: Secondary | ICD-10-CM | POA: Insufficient documentation

## 2023-03-25 DIAGNOSIS — R0602 Shortness of breath: Secondary | ICD-10-CM | POA: Diagnosis present

## 2023-03-25 DIAGNOSIS — E119 Type 2 diabetes mellitus without complications: Secondary | ICD-10-CM

## 2023-03-25 DIAGNOSIS — G4733 Obstructive sleep apnea (adult) (pediatric): Secondary | ICD-10-CM | POA: Insufficient documentation

## 2023-03-25 MED ORDER — PANTOPRAZOLE SODIUM 40 MG PO TBEC
40.0000 mg | DELAYED_RELEASE_TABLET | Freq: Every day | ORAL | 8 refills | Status: DC
Start: 2023-03-25 — End: 2023-09-03
  Filled 2023-03-25: qty 30, 30d supply, fill #0
  Filled 2023-04-17: qty 30, 30d supply, fill #1
  Filled 2023-05-15: qty 30, 30d supply, fill #2
  Filled 2023-06-16: qty 30, 30d supply, fill #3

## 2023-03-25 MED ORDER — TORSEMIDE 20 MG PO TABS
40.0000 mg | ORAL_TABLET | Freq: Every day | ORAL | 3 refills | Status: DC
  Filled 2023-03-25: qty 60, 20d supply, fill #0
  Filled 2023-06-16: qty 60, 20d supply, fill #1
  Filled 2023-07-24: qty 60, 20d supply, fill #2
  Filled 2023-08-24: qty 60, 20d supply, fill #3

## 2023-03-25 NOTE — Patient Instructions (Addendum)
 Medication Changes:  INCREASE Torsemide to 40mg  daily  Lab Work:  Please have lab work done in 1 week.  Please arrive to the [facility]. Go pass the gift shop and have your blood work completed.  We will only call you if the results are abnormal or if the provider would like to make medication changes.   Follow-Up in: Please follow up with the [facility] in 1 months. We do not currently have that schedule. We will contact you in order to schedule that appointment.   At the [facility], you and your health needs are our priority. We have a designated team specialized in the treatment of Heart Failure. This Care Team includes your primary Heart Failure Specialized Cardiologist (physician), Advanced Practice Providers (APPs- Physician Assistants and Nurse Practitioners), and Pharmacist who all work together to provide you with the care you need, when you need it.   You may see any of the following providers on your designated Care Team at your next follow up:  Dr. Arvilla Meres Dr. Marca Ancona Dr. Dorthula Nettles Dr. Theresia Bough Clarisa Kindred, NP Tonye Becket, NP Robbie Lis, [place] [facility] [place], [place] Brynda Peon, NP [place] Lee, NP Karle Plumber, PharmD   Please be sure to bring in all your medications bottles to every appointment.   Need to Contact us:  If you have any questions or concerns before your next appointment please send [place] a message through [place] or call our office at [phone].    TO LEAVE A MESSAGE FOR THE NURSE SELECT OPTION 2, PLEASE LEAVE A MESSAGE INCLUDING: YOUR NAME DATE OF BIRTH CALL BACK NUMBER REASON FOR CALL**this is important as we prioritize the call backs  YOU WILL RECEIVE A CALL BACK THE SAME DAY AS LONG AS YOU CALL BEFORE 4:00 PM

## 2023-03-26 ENCOUNTER — Telehealth: Payer: Self-pay

## 2023-03-26 ENCOUNTER — Ambulatory Visit: Payer: Medicare HMO | Admitting: Dietician

## 2023-03-26 ENCOUNTER — Other Ambulatory Visit (HOSPITAL_COMMUNITY): Payer: Self-pay

## 2023-03-26 NOTE — Telephone Encounter (Signed)
 Advanced Heart Failure Patient Advocate Encounter  The patient was approved for a [employer] grant that will help cover the cost of Farxiga, Losartan.  Total amount awarded, $10,000.  Effective: 02/24/2023 - 02/23/2024.  BIN [id number] PCN [id number] Group [id number] ID [id number]  Pharmacy provided with approval and processing information. Patient informed via voicemail.  Burnell Blanks, CPhT Rx Patient Advocate Phone: [phone]

## 2023-03-26 NOTE — Progress Notes (Signed)
 Care Guide Pharmacy Note  03/26/2023 Name: Bradley Mckenzie. MRN: [id number] DOB: 1965/02/18  Referred By: Billie Lade, MD Reason for referral: Care Coordination (Outreach to schedule with Pharm d )   [name]. is a 58 y.o. year old male who is a primary care patient of Billie Lade, MD.  [name]. was referred to the pharmacist for assistance related to: DMII  A third unsuccessful telephone outreach was attempted today to contact the patient who was referred to the pharmacy team for assistance with medication assistance. The Population Health team is pleased to engage with this patient at any time in the future upon receipt of referral and should he/she be interested in assistance from the [employer] [facility] team.  Penne Lash , RMA     [facility]  [facility] Guide  Direct Dial: [phone]  Website: [email or web address]

## 2023-03-27 ENCOUNTER — Other Ambulatory Visit (HOSPITAL_COMMUNITY): Payer: Self-pay

## 2023-04-02 ENCOUNTER — Telehealth: Payer: Self-pay

## 2023-04-02 NOTE — Telephone Encounter (Signed)
 Patient was identified as falling into the [place] Measure - Diabetes.   Patient was: Appointment scheduled for lab or office visit for A1c.

## 2023-04-10 ENCOUNTER — Ambulatory Visit: Admitting: Internal Medicine

## 2023-04-10 ENCOUNTER — Encounter: Payer: Self-pay | Admitting: Dietician

## 2023-04-10 ENCOUNTER — Encounter: Attending: Physician Assistant | Admitting: Dietician

## 2023-04-10 ENCOUNTER — Encounter: Payer: Self-pay | Admitting: Internal Medicine

## 2023-04-10 VITALS — BP 133/78 | HR 95 | Ht 69.0 in | Wt 283.6 lb

## 2023-04-10 DIAGNOSIS — E118 Type 2 diabetes mellitus with unspecified complications: Secondary | ICD-10-CM

## 2023-04-10 DIAGNOSIS — E119 Type 2 diabetes mellitus without complications: Secondary | ICD-10-CM | POA: Diagnosis not present

## 2023-04-10 DIAGNOSIS — Z713 Dietary counseling and surveillance: Secondary | ICD-10-CM | POA: Diagnosis not present

## 2023-04-10 DIAGNOSIS — J4531 Mild persistent asthma with (acute) exacerbation: Secondary | ICD-10-CM

## 2023-04-10 NOTE — Patient Instructions (Addendum)
 Contact your PCP to inform them of your continuing high blood sugar readings.  Try applying an overpatch to your G7 sensor and wrap it loosely with an ace bandage the help keep it in place.   Visit https://www.dexcom.com/faqs/adhesive-tips for more assistance if your sensors continue to have difficulty sticking.

## 2023-04-10 NOTE — Patient Instructions (Signed)
 No change in medications for now  You best bet is to see allergy for biologics in hopes you can reduce or eliminate chronic steroids in all forms though it may take up to 14-16  weeks accomplish  Follow up here is as needed once you establish there.

## 2023-04-10 NOTE — Assessment & Plan Note (Signed)
 Onset around 2017  - 05/10/2022  After extensive coaching inhaler device,  effectiveness =    25% improved to 755 (Ti too short) > try symbicort 80 2bid and max gerd rx  - FENO 05/10/2022  = 47 on wixella 250  - 05/10/2022   Walked on RA  x  one   lap(s) =  approx 150  ft  @ slow pace, stopped due to tired> sob with lowest 02 sats 92%   - 09/30/2022  After extensive coaching inhaler device,  effectiveness =    80%  - 09/30/2022   Walked on RA  x  3  lap(s) =  approx 450  ft  @ nl  pace, stopped due to end of study  with lowest 02 sats 94%   - 10/04/22  pfts p symbicort wnl  x for ERV 6% at wt 283 - Allergy screen 11/08/2022 >  Eos 0.3 /  IgE 122  done during flare of cough  with psueudohweeze on exam - 04/10/2023 referred to allergy due to freq need for neb steroids in pt on max symbicort 160 and trouble with insulin resistance   No change in rx feasible for now but may be a good candidate for allergy eval and consideration for biologics   Discussed in detail all the  indications, usual  risks and alternatives  relative to the benefits with patient who agrees to proceed with Rx as outlined.             Each maintenance medication was reviewed in detail including emphasizing most importantly the difference between maintenance and prns and under what circumstances the prns are to be triggered using an action plan format where appropriate.  Total time for H and P, chart review, counseling, reviewing hfa/neb device(s) and generating customized AVS unique to this office visit / same day charting = 25 min

## 2023-04-10 NOTE — Progress Notes (Signed)
 Bradley Mckenzie., male    DOB: [birth year]    MRN: [id number]   Brief patient profile:  63  yobf never really smoked regularly served in [employer] referred to pulmonary clinic in [place]  05/10/2022 by [name] PA  for sob/ cough    Baseline wt 230 around 2016  History of Present Illness  05/10/2022  Pulmonary/ 1st Mckenzie eval/ [name] / [name] Mckenzie - wt 273/  on maint wixella and prn saba but very poor Radiation protection practitioner Complaint  Patient presents with   Consult    Pt consult he says that he has been experiencing SOB >33yrs was unable to narrow down a timeframe of it worsening. States he smoke around the age of 18-18   Dyspnea:  used to walk any where he wanted but around 2017 noted worsened now has go to slower than  others.  Has to stop on 2nd landing  to catch breath since moving into new appt. Cough: tends to be worse with ex and variably at hs/ non productive Sleep: flat bed x 1 pillows   7 nights/ a week / places cpap always sob and sometimes coughing / has not tried recliner  SABA use: w/in on hour of ov Rec Plan A = Automatic = Always=    Symbicort 80 Take 2 puffs (or Breztri x one puff) first thing in am and then another 2 puffs about 12 hours later.  Work on inhaler technique:  Plan B = Backup (to supplement plan A, not to replace it) Only use your albuterol inhaler as a rescue medication Plan C = Crisis (instead of Plan B but only if Plan B stops working) - only use your albuterol nebulizer if you first try Plan B  Pantoprazole (protonix) 40 mg   Take  30-60 min before first meal of the day and Pepcid (famotidine)  20 mg after supper until return to Mckenzie - this is the best way to tell whether stomach acid is contributing to your problem.   GERD diet reviewed, bed blocks rec  Please schedule a follow up Mckenzie visit in 4 weeks, sooner if needed  with all medications /inhalers/ solutions in hand so we can verify exactly what you are taking. This includes all medications from all  doctors and over the counters     09/30/2022  f/u ov/Bradley Mckenzie/[name] re: sob / cough with elevated FENO c/w asthma  maint on symb 160  2bid did not bring meds as rec  Chief Complaint  Patient presents with   Shortness of Breath   Dyspnea:  x years  x treadmill  x 10 minutes "nl pace" / slt incline  Cough: still on tessalon daily > did not understand prn/ no longer on gerd rx and has overt HB Sleeping: cpap per Katie s resp cc  SABA use: takes it daily  02: none   Rec Plan A = Automatic = Always=    Symbicort 160 Take 2 puffs first thing in am and then another 2 puffs about 12 hours later.  Resume Pantoprazole (protonix) 40 mg   Take  30-60 min before first meal of the day and Pepcid (famotidine)  20 mg after supper until return to Mckenzie  Plan B = Backup (to supplement plan A, not to replace it) Only use your albuterol inhaler as a rescue medication Plan C = Crisis (instead of Plan B but only if Plan B stops working) - only use your albuterol nebulizer if you first try Plan  B   Please schedule a follow up Mckenzie visit in 6 weeks, call sooner if needed with all medications /inhalers/ solutions in hand    10/04/22  pfts p symbicort 160 wnl  x for ERV 6% at wt 283   11/08/2022  f/u ov/Bradley Mckenzie/[name] re: asthma/ MO maint on symbicort/spiriva    did not bring meds  /  Chief Complaint  Patient presents with   Moderate persistent asthmatic bronchitis   Dyspnea:  treadmill 3 x per week, 16 min (not electric) and can't set resistance  Cough: x sev days severe cough not responding to tessilon 200 but only taking one a day / min mucoid production Sleeping: having trouble with cpap  mask SABA use: not much at all  02: none Rec Call if me can't work out your cpap issues and we'll send you for a second opinion  Depomedrol 120 mg IM  For cough >  try tessalon 200 mg up to every 4 hours and supplement with  phenergan  DM up to a tsp every 4 hours as needed  Please remember to go to  the lab department   for your tests - we will call you with the results when they are available.       02/10/2023  f/u ov/Bradley Mckenzie/[name] re: Asthma/MO  maint on symbicort/spiriva  Chief Complaint  Patient presents with   Follow-up  Dyspnea:  treadmill x 15 -20 min x 3 weekly Cough: sporadic mostly dry   Sleeping: maybe 30 degrees s resp cc on cpap  SABA use: none 02: none  Rec Stop spiriva Continue Symbicort 160 Take 2 puffs first thing in am and then another 2 puffs about 12 hours later.  Also  Ok to try albuterol 15 min before an activity   To get the most out of exercise, you need to be continuously aware that you are short of breath, but never out of breath, for at least 30 minutes daily. As you improve, it will actually be easier for you to do the same amount of exercise  in  30 minutes so always push to the level where you are short of breath.       04/10/2023  f/u ov/Bradley Mckenzie/[name] re: asthma maint on symbicort 160 supplement with pulmocort /alb neb prn   Chief Complaint  Patient presents with   Follow-up    Discuss inhaler and blood sugar   Dyspnea:  treadmill x 20 min not motorized ., not really sob, no need  Cough: not a problem Sleeping: bed is 20 degrees s resp cc  SABA use: not needing  02: none     No obvious day to day or daytime variability or assoc excess/ purulent sputum or mucus plugs or hemoptysis or cp or chest tightness, subjective wheeze or overt sinus or hb symptoms.    Also denies any obvious fluctuation of symptoms with weather or environmental changes or other aggravating or alleviating factors except as outlined above   No unusual exposure hx or h/o childhood pna/ asthma or knowledge of premature birth.  Current Allergies, Complete Past Medical History, Past Surgical History, Family History, and Social History were reviewed in [name] record.  ROS  The following are not active complaints unless  bolded Hoarseness, sore throat, dysphagia, dental problems, itching, sneezing,  nasal congestion or discharge of excess mucus or purulent secretions, ear ache,   fever, chills, sweats, unintended wt loss or wt gain, classically pleuritic or exertional cp,  orthopnea pnd or arm/hand swelling  or leg swelling, presyncope, palpitations, abdominal pain, anorexia, nausea, vomiting, diarrhea  or change in bowel habits or change in bladder habits, change in stools or change in urine, dysuria, hematuria,  rash, arthralgias, visual complaints, headache, numbness, weakness or ataxia or problems with walking or coordination,  change in mood or  memory.        Current Meds  Medication Sig   Accu-Chek Softclix Lancets lancets Use to check blood sugar three times daily   albuterol (PROAIR HFA) 108 (90 Base) MCG/ACT inhaler Inhale 2 puffs into the lungs every 6 (six) hours as needed for wheezing or shortness of breath.   atorvastatin (LIPITOR) 20 MG tablet Take 1 tablet (20 mg total) by mouth daily.   Blood Glucose Monitoring Suppl (ACCU-CHEK GUIDE) w/Device KIT 1 each by Does not apply route 3 (three) times daily.   Blood Glucose Monitoring Suppl DEVI 1 each by Does not apply route in the morning, at noon, and at bedtime. May substitute to any manufacturer covered by patient's insurance.   Blood Pressure Monitoring (BLOOD PRESSURE CUFF) MISC USE AS DIRECTED ONCE DAILY AT 12 NOON.   budesonide (PULMICORT) 0.5 MG/2ML nebulizer solution Take 2 mLs (0.5 mg total) by nebulization 2 (two) times daily as needed (shortness of breath or wheezing).   budesonide-formoterol (SYMBICORT) 160-4.5 MCG/ACT inhaler Inhale 2 puffs into the lungs 2 (two) times daily.   cetirizine (ZYRTEC) 10 MG tablet Take 1 tablet (10 mg total) by mouth daily.   Continuous Glucose Receiver (DEXCOM G7 RECEIVER) DEVI 1 each by Does not apply route continuous.   Continuous Glucose Sensor (DEXCOM G7 SENSOR) MISC 1 each by Does not apply route every 14  (fourteen) days.   dapagliflozin propanediol (FARXIGA) 10 MG TABS tablet Take 10 mg by mouth daily.   famotidine (PEPCID) 20 MG tablet Take 1 tablet (20 mg total) by mouth daily after supper.   glucose blood (ACCU-CHEK GUIDE) test strip Use as instructed   hydrALAZINE (APRESOLINE) 25 MG tablet Take 3 tablets (75 mg total) by mouth 2 (two) times daily. (Patient taking differently: Take 25 mg by mouth 2 (two) times daily.)   insulin glargine (LANTUS) 100 UNIT/ML Solostar Pen Inject 31 Units into the skin daily.   Insulin Pen Needle 30G X 5 MM MISC Use as directed daily.   Insulin Pen Needle 32G X 4 MM MISC Use as directed   ipratropium-albuterol (DUONEB) 0.5-2.5 (3) MG/3ML SOLN Take 3 mLs by nebulization every 4 (four) hours as needed.   losartan (COZAAR) 100 MG tablet Take 1 tablet (100 mg total) by mouth daily.   metFORMIN (GLUCOPHAGE-XR) 500 MG 24 hr tablet Take 1 tablet (500 mg total) by mouth 2 (two) times daily with a meal.   montelukast (SINGULAIR) 10 MG tablet Take 1 tablet (10 mg total) by mouth at bedtime.   nebivolol (BYSTOLIC) 5 MG tablet Take 1 tablet (5 mg total) by mouth daily.   pantoprazole (PROTONIX) 40 MG tablet Take 1 tablet (40 mg total) by mouth daily. Take 30-60 min before first meal of the day   potassium chloride SA (KLOR-CON M) 20 MEQ tablet Take 1 tablet (20 mEq total) by mouth 2 (two) times daily.   promethazine-dextromethorphan (PROMETHAZINE-DM) 6.25-15 MG/5ML syrup Take 5 mLs by mouth 4 (four) times daily as needed for cough.   Rightest GL300 Lancets MISC USE AS DIRECTED.   Semaglutide, 2 MG/DOSE, (OZEMPIC, 2 MG/DOSE,) 8 MG/3ML SOPN Inject 2 mg into the skin  once a week.   torsemide (DEMADEX) 20 MG tablet Take 2 tablets (40 mg total) by mouth daily. May take an extra 20mg  tablet daily as needed for worsening shortness of breath, swelling, or weight gain.                   Past Medical History:  Diagnosis Date   Allergy    Asthma    Cardiomyopathy (HCC) 2016    a. 01/2014 Echo: EF 25-30%;  b. ? ischemic vs non-ischemic.  He's never had an ischemic eval.   Chronic combined systolic (congestive) and diastolic (congestive) heart failure (HCC)    a. 01/2014 Echo: EF 25-30%. b. Echo 03/2015: Improved EF of 40-45%   COPD (chronic obstructive pulmonary disease) (HCC)    Depression    Diabetes mellitus without complication (HCC) 2018   started med therapy approx 2018   Hyperlipidemia    Hypertension    Hypertensive cardiomyopathy (HCC) 06/30/2015   Hypertensive heart disease    a. Since his 21's.   Morbid obesity (HCC)    Obstructive sleep apnea    a. Has not used CPAP since ~ 2012.   Sleep apnea        Objective:    Wts  04/10/2023       283  02/10/2023       289  11/08/2022     284  09/30/22 289 lb (131.1 kg)  09/13/22 287 lb 9.6 oz (130.5 kg)  09/10/22 283 lb 12.8 oz (128.7 kg)      Vital signs reviewed  04/10/2023  - Note at rest 02 sats  94% on RA   General appearance:    amb MO (by bmi) pleasant bm nad    HEENT : Oropharynx  clear      Nasal turbinates nl    NECK :  without  apparent JVD/ palpable Nodes/TM    LUNGS: no acc muscle use,  Nl contour chest with a few insp squeaks bilaterally, no true wheeze   CV:  RRR  no s3 or murmur or increase in P2, and no edema   ABD:  soft and nontender   MS:  Gait nl   ext warm without deformities Or obvious joint restrictions  calf tenderness, cyanosis or clubbing    SKIN: warm and dry without lesions    NEURO:  alert, approp, nl sensorium with  no motor or cerebellar deficits apparent.               Assessment

## 2023-04-10 NOTE — Progress Notes (Signed)
 Diabetes Self-Management Education  Visit Type: Follow-up  Appt. Start Time: 0930 Appt. End Time: 1000  04/10/2023  Bradley Mckenzie, identified by name and date of birth, is a 58 y.o. male with a diagnosis of Diabetes:  .   ASSESSMENT Pt reports significant back pain over last 3 days, unable to walk without discomfort, taking ibuprofen with little relief. Pt reports starting Dexcom G7, states the sensor is having difficulty staying on their arm, pt has tried numerous different adhesives to prevent this with little help. Pt reports seeing hyperglycemia as high as 400, elevating fasting glucose numbers that are getting over 200 mg/dL. Pt states they believe their Symbicort is causing elevated blood glucose. Pt reports continuing Lantus @31u  and Metformin, stopped taking Ozempic in January d/t cost. Pt reports concerns over complications a/w uncontrolled diabetes, states they are trying their best to control it but are frustrated with continually elevated glucose readings.   Diabetes Self-Management Education - 04/10/23 1342       Visit Information   Visit Type Follow-up      Psychosocial Assessment   Patient Belief/Attitude about Diabetes Afraid    What is the hardest part about your diabetes right now, causing you the most concern, or is the most worrisome to you about your diabetes?   Taking/obtaining medications;Getting support / problem solving      Pre-Education Assessment   Patient understands the diabetes disease and treatment process. Needs Review    Patient understands incorporating nutritional management into lifestyle. Needs Review    Patient undertands incorporating physical activity into lifestyle. Needs Review    Patient understands using medications safely. Needs Review    Patient understands monitoring blood glucose, interpreting and using results Needs Review    Patient understands prevention, detection, and treatment of acute complications. Needs Review    Patient  understands prevention, detection, and treatment of chronic complications. Needs Review    Patient understands how to develop strategies to address psychosocial issues. Needs Review    Patient understands how to develop strategies to promote health/change behavior. Needs Review      Complications   How often do you check your blood sugar? > 4 times/day   CGM, having trouble with sensors sticking   Fasting Blood glucose range (mg/dL) >962    Postprandial Blood glucose range (mg/dL) >952   Reports values as high as 400 mg/dL   Number of hyperglycemic episodes ( >200mg /dL): Daily    Can you tell when your blood sugar is high? No      Patient Education   Medications Reviewed patients medication for diabetes, action, purpose, timing of dose and side effects.    Monitoring Taught/evaluated CGM (comment)   Methods to prevent sensor from falling off   Acute complications Discussed and identified patients' prevention, symptoms, and treatment of hyperglycemia.   Contact PCP regarding significant hyperglycemia     Individualized Goals (developed by patient)   Medications take my medication as prescribed;Other (comment)   Contact PCP for medication recommendations r/t hyperglycemia   Monitoring  Consistenly use CGM      Patient Self-Evaluation of Goals - Patient rates self as meeting previously set goals (% of time)   Nutrition 25 - 50% (sometimes)    Physical Activity 25 - 50% (sometimes)   DOE on Symbicort   Medications >75% (most of the time)    Monitoring 50 - 75 % (half of the time)    Problem Solving and behavior change strategies  25 - 50% (sometimes)  Reducing Risk (treating acute and chronic complications) < 25% (hardly ever/never)    Health Coping 25 - 50% (sometimes)      Post-Education Assessment   Patient understands the diabetes disease and treatment process. Needs Review    Patient understands incorporating nutritional management into lifestyle. Needs Review    Patient undertands  incorporating physical activity into lifestyle. Needs Review    Patient understands using medications safely. Comphrehends key points    Patient understands monitoring blood glucose, interpreting and using results Comprehends key points    Patient understands prevention, detection, and treatment of acute complications. Needs Review    Patient understands prevention, detection, and treatment of chronic complications. Needs Review    Patient understands how to develop strategies to address psychosocial issues. Needs Review    Patient understands how to develop strategies to promote health/change behavior. Needs Review      Outcomes   Expected Outcomes Demonstrated interest in learning but significant barriers to change    Future DMSE PRN    Program Status Not Completed      Subsequent Visit   Since your last visit have you continued or begun to take your medications as prescribed? Yes    Since your last visit have you had your blood pressure checked? Yes    Is your most recent blood pressure lower, unchanged, or higher since your last visit? Unchanged    Since your last visit have you experienced any weight changes? No change    Since your last visit, are you checking your blood glucose at least once a day? No   Trying to get CGM to stay on            Individualized Plan for Diabetes Self-Management Training:   Learning Objective:  Patient will have a greater understanding of diabetes self-management. Patient education plan is to attend individual and/or group sessions per assessed needs and concerns.   Plan:   Patient Instructions  Contact your PCP to inform them of your continuing high blood sugar readings.  Try applying an overpatch to your G7 sensor and wrap it loosely with an ace bandage the help keep it in place.   Visit https://www.dexcom.com/faqs/adhesive-tips for more assistance if your sensors continue to have difficulty sticking.   Expected Outcomes:  Demonstrated  interest in learning but significant barriers to change  If problems or questions, patient to contact team via:  Phone and Email  Future DSME appointment: PRN

## 2023-04-17 ENCOUNTER — Other Ambulatory Visit: Payer: Self-pay | Admitting: Physician Assistant

## 2023-04-17 ENCOUNTER — Other Ambulatory Visit: Payer: Self-pay

## 2023-04-17 MED FILL — Losartan Potassium Tab 100 MG: ORAL | 90 days supply | Qty: 90 | Fill #1 | Status: AC

## 2023-04-17 MED FILL — Potassium Chloride Microencapsulated Crys ER Tab 20 mEq: ORAL | 90 days supply | Qty: 180 | Fill #1 | Status: AC

## 2023-04-18 ENCOUNTER — Other Ambulatory Visit: Payer: Self-pay | Admitting: Internal Medicine

## 2023-04-18 ENCOUNTER — Other Ambulatory Visit: Payer: Self-pay

## 2023-04-18 MED FILL — Insulin Glargine Soln Pen-Injector 100 Unit/ML: SUBCUTANEOUS | 87 days supply | Qty: 27 | Fill #0 | Status: AC

## 2023-04-18 MED FILL — Insulin Glargine Soln Pen-Injector 100 Unit/ML: SUBCUTANEOUS | 48 days supply | Qty: 15 | Fill #0 | Status: CN

## 2023-04-18 MED FILL — Insulin Glargine Soln Pen-Injector 100 Unit/ML: SUBCUTANEOUS | 87 days supply | Qty: 27 | Fill #0 | Status: CN

## 2023-04-20 ENCOUNTER — Other Ambulatory Visit: Payer: Self-pay

## 2023-04-21 ENCOUNTER — Encounter: Payer: Self-pay | Admitting: Internal Medicine

## 2023-04-21 ENCOUNTER — Ambulatory Visit: Payer: Medicare HMO | Admitting: Internal Medicine

## 2023-04-21 VITALS — BP 125/74 | HR 99 | Ht 69.0 in | Wt 281.0 lb

## 2023-04-21 DIAGNOSIS — N1831 Chronic kidney disease, stage 3a: Secondary | ICD-10-CM

## 2023-04-21 DIAGNOSIS — E118 Type 2 diabetes mellitus with unspecified complications: Secondary | ICD-10-CM

## 2023-04-21 DIAGNOSIS — Z794 Long term (current) use of insulin: Secondary | ICD-10-CM

## 2023-04-21 DIAGNOSIS — Z6841 Body Mass Index (BMI) 40.0 and over, adult: Secondary | ICD-10-CM

## 2023-04-21 NOTE — Patient Instructions (Signed)
 It was a pleasure to see you today.  Thank you for giving [place] the opportunity to be involved in your care.  Below is a brief recap of your visit and next steps.  We will plan to see you again in 6 weeks.  Summary I recommend increasing metformin xr to 1000 mg twice daily. Initially increase to 1000 mg in the morning and 500 mg in the evening. Ok to further increase to 1000 mg twice daily after 3-4 days.  Follow up in 6 weeks

## 2023-04-22 ENCOUNTER — Other Ambulatory Visit: Payer: Self-pay

## 2023-04-22 MED ORDER — METFORMIN HCL ER 500 MG PO TB24
1000.0000 mg | ORAL_TABLET | Freq: Two times a day (BID) | ORAL | 2 refills | Status: DC
  Filled 2023-04-22: qty 120, 30d supply, fill #0
  Filled 2023-07-24: qty 120, 30d supply, fill #1
  Filled 2023-09-25: qty 120, 30d supply, fill #2

## 2023-04-22 MED ORDER — OZEMPIC (0.25 OR 0.5 MG/DOSE) 2 MG/3ML ~~LOC~~ SOPN
0.2500 mg | PEN_INJECTOR | SUBCUTANEOUS | 0 refills | Status: DC
  Filled 2023-04-22: qty 3, 56d supply, fill #0

## 2023-04-22 NOTE — Assessment & Plan Note (Signed)
 Returning to care today for follow-up of diabetes mellitus that has historically been difficult to control.  He has been referred to clinical pharmacy and endocrinology but has not been able to establish care with either service.  He is using a Dexcom and reports an average blood sugar of 217 over the last week.  He has multiple readings exceeding 400.  Bradley Mckenzie is currently taking metformin XR 500 mg twice daily and Lantus 31 units nightly.  He stopped [place] due to neuropathy and has been off Ozempic since January due to cost. -Treatment options reviewed.  Recommend increasing metformin XR to 1000 mg twice daily.  Will follow-up on previously placed referral to clinical pharmacy as I believe he would greatly benefit from resuming GLP-1 treatment and assistance with disease management.  With this in mind, I will also place a new order for Ozempic 0.25 mg weekly.  Continue Lantus as currently prescribed.  Okay to hold [place] for now in the setting of neuropathy.  He plans to retrial medication if neuropathy does not improve despite holding [place].  We discussed attempting to optimize his most recently prescribed diabetes medication regimen vs adding more medication.  He is in agreement with this plan and understands that improving diabetes control is a gradual process.  Repeat labs were ordered today and we will tentatively plan for follow-up in 6 weeks.

## 2023-04-22 NOTE — Progress Notes (Signed)
 Established Patient Office Visit  Subjective   Patient ID: Bradley Dorvil., male    DOB: 12-Apr-1965  Age: 58 y.o. MRN: [id number]  Chief Complaint  Patient presents with   Diabetes    Follow up    Bradley Mckenzie returns to care today for follow-up of diabetes mellitus.  He was last evaluated by me on 2/21 at which time he was understandably frustrated and his increasing A1c.  A1c 9.0 on labs from December, which was increased from 7.8 previously.  He reported that he had been off of Ozempic since January due to cost.  I placed a referral to clinical pharmacy for assistance with medications and disease management.  Dexcom ordered as well.  He was switched to extended release metformin as he previously endorsed diarrhea with higher doses.  4-week follow-up was arranged for diabetes management.  In the interim, he has been seen by cardiology and pulmonology for follow-up.  Today he reports feeling well.  He does not have any additional concerns to discuss.  He has been monitoring his blood sugar regularly and reports that his average blood sugar has been 217 over the last week.  He has multiple readings exceeding 400.  He has stopped taking Farxiga since his last appointment due to suspected neuropathy.  Unfortunately, he has not been able to establish care with clinical pharmacy.  Cardiology has also placed a referral to endocrinology.  He does not have an appointment scheduled at this time.  Past Medical History:  Diagnosis Date   Allergy    Asthma    Cardiomyopathy (HCC) 2016   a. 01/2014 Echo: EF 25-30%;  b. ? ischemic vs non-ischemic.  He's never had an ischemic eval.   Chronic combined systolic (congestive) and diastolic (congestive) heart failure (HCC)    a. 01/2014 Echo: EF 25-30%. b. Echo 03/2015: Improved EF of 40-45%   COPD (chronic obstructive pulmonary disease) (HCC)    Depression    Diabetes mellitus without complication (HCC) 2018   started med therapy approx 2018   Hyperlipidemia     Hypertension    Hypertensive cardiomyopathy (HCC) 06/30/2015   Hypertensive heart disease    a. Since his 42's.   Morbid obesity (HCC)    Obstructive sleep apnea    a. Has not used CPAP since ~ 2012.   Sleep apnea    Past Surgical History:  Procedure Laterality Date   HERNIA REPAIR  2004   Social History   Tobacco Use   Smoking status: Never   Smokeless tobacco: Never   Tobacco comments:    Quit in his 20's.  Vaping Use   Vaping status: Never Used  Substance Use Topics   Alcohol use: Not Currently    Comment: rarely alcohol use at special events   Drug use: Not Currently    Comment: Used marijuana in his teens.   Family History  Problem Relation Age of Onset   Heart disease Mother        alive & well.   Heart attack Father 43       died @ 66 of cancer.   Diabetes Father    Cancer Father        "in his stomach"   Cancer Sister    Cancer Paternal Uncle        strong FH malignancy multiple relavies    Allergies  Allergen Reactions   Spironolactone Other (See Comments)    gynecomastia   Review of Systems  Constitutional:  Negative  for chills and fever.  HENT:  Negative for sore throat.   Respiratory:  Negative for cough and shortness of breath.   Cardiovascular:  Negative for chest pain, palpitations and leg swelling.  Gastrointestinal:  Negative for abdominal pain, blood in stool, constipation, diarrhea, nausea and vomiting.  Genitourinary:  Negative for dysuria and hematuria.  Musculoskeletal:  Negative for myalgias.  Skin:  Negative for itching and rash.  Neurological:  Negative for dizziness and headaches.  Psychiatric/Behavioral:  Negative for depression and suicidal ideas.      Objective:     BP 125/74 (BP Location: Left Arm, Patient Position: Sitting, Cuff Size: Large)   Pulse 99   Ht 5\' 9"  (1.753 m)   Wt 281 lb (127.5 kg)   SpO2 92%   BMI 41.50 kg/m  BP Readings from Last 3 Encounters:  04/21/23 125/74  04/10/23 133/78  03/25/23 137/70    Physical Exam Vitals reviewed.  Constitutional:      General: He is not in acute distress.    Appearance: Normal appearance. He is obese. He is not ill-appearing.  HENT:     Head: Normocephalic and atraumatic.     Right Ear: External ear normal.     Left Ear: External ear normal.     Nose: Nose normal. No congestion or rhinorrhea.     Mouth/Throat:     Mouth: Mucous membranes are moist.     Pharynx: Oropharynx is clear.  Eyes:     General: No scleral icterus.    Extraocular Movements: Extraocular movements intact.     Conjunctiva/sclera: Conjunctivae normal.     Pupils: Pupils are equal, round, and reactive to light.  Cardiovascular:     Rate and Rhythm: Normal rate and regular rhythm.     Pulses: Normal pulses.     Heart sounds: Normal heart sounds. No murmur heard. Pulmonary:     Effort: Pulmonary effort is normal.     Breath sounds: Normal breath sounds. No wheezing, rhonchi or rales.  Abdominal:     General: Abdomen is flat. Bowel sounds are normal. There is no distension.     Palpations: Abdomen is soft.     Tenderness: There is no abdominal tenderness.  Musculoskeletal:        General: No swelling or deformity. Normal range of motion.     Cervical back: Normal range of motion.  Skin:    General: Skin is warm and dry.     Capillary Refill: Capillary refill takes less than 2 seconds.  Neurological:     General: No focal deficit present.     Mental Status: He is alert and oriented to person, place, and time.     Motor: No weakness.  Psychiatric:        Mood and Affect: Mood normal.        Behavior: Behavior normal.        Thought Content: Thought content normal.   Last CBC Lab Results  Component Value Date   WBC 9.9 11/08/2022   HGB 14.2 11/08/2022   HCT 44.8 11/08/2022   MCV 85 11/08/2022   MCH 27.0 11/08/2022   RDW 14.5 11/08/2022   PLT 235 11/08/2022   Last metabolic panel Lab Results  Component Value Date   GLUCOSE 131 (H) 12/31/2022   NA 141  12/31/2022   K 4.0 12/31/2022   CL 103 12/31/2022   CO2 27 12/31/2022   BUN 15 12/31/2022   CREATININE 1.83 (H) 12/31/2022   GFRNONAA 43 (L)  12/31/2022   CALCIUM 9.5 12/31/2022   PHOS 6.0 (H) 09/02/2015   PROT 8.0 08/30/2022   ALBUMIN 4.6 08/30/2022   LABGLOB 3.4 08/30/2022   AGRATIO 1.4 04/18/2022   BILITOT 0.2 08/30/2022   ALKPHOS 116 08/30/2022   AST 20 08/30/2022   ALT 33 08/30/2022   ANIONGAP 11 12/31/2022   Last lipids Lab Results  Component Value Date   CHOL 144 12/31/2022   HDL 44 12/31/2022   LDLCALC 83 12/31/2022   TRIG 87 12/31/2022   CHOLHDL 3.3 12/31/2022   Last hemoglobin A1c Lab Results  Component Value Date   HGBA1C 9.0 (H) 12/31/2022   Last thyroid functions Lab Results  Component Value Date   TSH 1.140 11/22/2020   Last vitamin D Lab Results  Component Value Date   VD25OH 32.6 11/14/2021   Last vitamin B12 and Folate Lab Results  Component Value Date   VITAMINB12 296 02/19/2023   The 10-year ASCVD risk score (Arnett DK, et al., 2019) is: 18.8%    Assessment & Plan:   Problem List Items Addressed This Visit       Type 2 diabetes mellitus with complication (HCC) - Primary (Chronic)   Returning to care today for follow-up of diabetes mellitus that has historically been difficult to control.  He has been referred to clinical pharmacy and endocrinology but has not been able to establish care with either service.  He is using a Dexcom and reports an average blood sugar of 217 over the last week.  He has multiple readings exceeding 400.  Bradley Mckenzie is currently taking metformin XR 500 mg twice daily and Lantus 31 units nightly.  He stopped [place] due to neuropathy and has been off Ozempic since January due to cost. -Treatment options reviewed.  Recommend increasing metformin XR to 1000 mg twice daily.  Will follow-up on previously placed referral to clinical pharmacy as I believe he would greatly benefit from resuming GLP-1 treatment and  assistance with disease management.  With this in mind, I will also place a new order for Ozempic 0.25 mg weekly.  Continue Lantus as currently prescribed.  Okay to hold [place] for now in the setting of neuropathy.  He plans to retrial medication if neuropathy does not improve despite holding [place].  We discussed attempting to optimize his most recently prescribed diabetes medication regimen vs adding more medication.  He is in agreement with this plan and understands that improving diabetes control is a gradual process.  Repeat labs were ordered today and we will tentatively plan for follow-up in 6 weeks.      Return in about 6 weeks (around 06/02/2023).   Billie Lade, MD

## 2023-04-23 ENCOUNTER — Encounter: Payer: Self-pay | Admitting: Internal Medicine

## 2023-04-23 LAB — CBC WITH DIFFERENTIAL/PLATELET
Basophils Absolute: 0.1 10*3/uL (ref 0.0–0.2)
Basos: 1 %
EOS (ABSOLUTE): 0.4 10*3/uL (ref 0.0–0.4)
Eos: 4 %
Hematocrit: 43.7 % (ref 37.5–51.0)
Hemoglobin: 14.2 g/dL (ref 13.0–17.7)
Immature Grans (Abs): 0 10*3/uL (ref 0.0–0.1)
Immature Granulocytes: 0 %
Lymphocytes Absolute: 2.3 10*3/uL (ref 0.7–3.1)
Lymphs: 24 %
MCH: 27.3 pg (ref 26.6–33.0)
MCHC: 32.5 g/dL (ref 31.5–35.7)
MCV: 84 fL (ref 79–97)
Monocytes Absolute: 1 10*3/uL — ABNORMAL HIGH (ref 0.1–0.9)
Monocytes: 11 %
Neutrophils Absolute: 5.7 10*3/uL (ref 1.4–7.0)
Neutrophils: 60 %
Platelets: 279 10*3/uL (ref 150–450)
RBC: 5.2 x10E6/uL (ref 4.14–5.80)
RDW: 13.7 % (ref 11.6–15.4)
WBC: 9.5 10*3/uL (ref 3.4–10.8)

## 2023-04-23 LAB — MICROALBUMIN / CREATININE URINE RATIO
Creatinine, Urine: 36 mg/dL
Microalb/Creat Ratio: 31 mg/g{creat} — ABNORMAL HIGH (ref 0–29)
Microalbumin, Urine: 11.2 ug/mL

## 2023-04-23 LAB — BASIC METABOLIC PANEL WITH GFR
BUN/Creatinine Ratio: 9 (ref 9–20)
BUN: 13 mg/dL (ref 6–24)
CO2: 22 mmol/L (ref 20–29)
Calcium: 9.5 mg/dL (ref 8.7–10.2)
Chloride: 100 mmol/L (ref 96–106)
Creatinine, Ser: 1.51 mg/dL — ABNORMAL HIGH (ref 0.76–1.27)
Glucose: 195 mg/dL — ABNORMAL HIGH (ref 70–99)
Potassium: 3.9 mmol/L (ref 3.5–5.2)
Sodium: 141 mmol/L (ref 134–144)
eGFR: 54 mL/min/{1.73_m2} — ABNORMAL LOW (ref >59)

## 2023-04-23 LAB — HEMOGLOBIN A1C
Est. average glucose Bld gHb Est-mCnc: 275 mg/dL
Hgb A1c MFr Bld: 11.2 % — ABNORMAL HIGH (ref 4.8–5.6)

## 2023-04-30 DIAGNOSIS — E1159 Type 2 diabetes mellitus with other circulatory complications: Secondary | ICD-10-CM | POA: Diagnosis not present

## 2023-04-30 DIAGNOSIS — E1140 Type 2 diabetes mellitus with diabetic neuropathy, unspecified: Secondary | ICD-10-CM | POA: Diagnosis not present

## 2023-04-30 DIAGNOSIS — E1122 Type 2 diabetes mellitus with diabetic chronic kidney disease: Secondary | ICD-10-CM | POA: Diagnosis not present

## 2023-04-30 DIAGNOSIS — E1165 Type 2 diabetes mellitus with hyperglycemia: Secondary | ICD-10-CM | POA: Diagnosis not present

## 2023-04-30 DIAGNOSIS — E1169 Type 2 diabetes mellitus with other specified complication: Secondary | ICD-10-CM | POA: Diagnosis not present

## 2023-04-30 DIAGNOSIS — Z794 Long term (current) use of insulin: Secondary | ICD-10-CM | POA: Diagnosis not present

## 2023-04-30 DIAGNOSIS — E1129 Type 2 diabetes mellitus with other diabetic kidney complication: Secondary | ICD-10-CM | POA: Diagnosis not present

## 2023-04-30 DIAGNOSIS — R809 Proteinuria, unspecified: Secondary | ICD-10-CM | POA: Diagnosis not present

## 2023-05-06 DIAGNOSIS — H52223 Regular astigmatism, bilateral: Secondary | ICD-10-CM | POA: Diagnosis not present

## 2023-05-14 DIAGNOSIS — E1165 Type 2 diabetes mellitus with hyperglycemia: Secondary | ICD-10-CM | POA: Diagnosis not present

## 2023-05-15 ENCOUNTER — Other Ambulatory Visit: Payer: Self-pay

## 2023-05-16 ENCOUNTER — Other Ambulatory Visit: Payer: Self-pay

## 2023-05-23 NOTE — Progress Notes (Unsigned)
   Telephone Visit- Progress Note: Referring Physician:  Tobi Fortes, MD [address] [place],  [place] [address]  Primary Physician:  Tobi Fortes, MD  This visit was performed via telephone.  Patient location: home Provider location: office  I spent a total of 15 minutes non-face-to-face activities for this visit on the date of this encounter including review of current clinical condition and response to treatment.    Patient has given verbal consent to this telephone visits and we reviewed the limitations of a telephone visit. Patient wishes to proceed.    Chief Complaint:  follow up  History of Present Illness: history of heart failure, HTN, hypertensive cardiomyopathy, OSA, COPD, CKD 2, and DM.  He has known lumbar spondylosis L3-S1 with DDD. He has mild lateral recess stenosis bilaterally at L3-L4 and L4-L5 with mild lateral recess stenosis on right at L5-S1. No significant compression noted.    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 No lumbar radiculopathy.   He was to continue doing HEP for his back. He did phone visit with me on 03/05/23. Today's visit was scheduled an as in person visit but changed to phone visit as he had transportation issues.   He has intermittent LBP with good days and bad days. This is more tolerable. He stopped farxiga  as known side effect is neuropathy. He has numbness in left heel that is more constant. Numbness and tingling in his legs is better.  He is taking vitamin B12 and alpha lipoic acid supplements as recommended by neurology.   He is working with his PCP and endocrine to get his DM under better control.   Conservative measures:  Physical therapy: he did PT in 1992 and 2019, doing HEP that I sent him.  Multimodal medical therapy including regular antiinflammatories: mobic , robaxin , motrin , naprosyn   Injections: No epidural steroid  injections  Past Surgery: No spinal surgery  Bradley Mckenzie. has no symptoms of cervical myelopathy.   Exam: No exam done as this was a telephone encounter.     Imaging: none  Assessment and Plan: Bradley Mckenzie is a pleasant 57 y.o. male with a history of chronic LBP x years.     He has intermittent LBP with good days and bad days. This is more tolerable. He has numbness in left heel that is more constant. Numbness and tingling in his legs is better.     He has known lumbar spondylosis L3-S1 with DDD. He has mild lateral recess stenosis bilaterally at L3-L4 and L4-L5 with mild lateral recess stenosis on right at L5-S1. No significant compression noted.    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 No lumbar radiculopathy.    Treatment options discussed with patient and following plan made:   - Continue to optimize his blood sugars with PCP and endocrine.  - Continue HEP for his lower back.   - If LBP gets worse, can revisit formal PT. Can also consider injections, but he hates needles.  - Follow up with me in 3 months and prn.   Lucetta Russel PA-C Neurosurgery

## 2023-05-26 ENCOUNTER — Other Ambulatory Visit: Payer: Self-pay

## 2023-05-27 ENCOUNTER — Other Ambulatory Visit: Payer: Self-pay

## 2023-05-30 ENCOUNTER — Encounter: Payer: Self-pay | Admitting: Orthopedic Surgery

## 2023-05-30 ENCOUNTER — Encounter: Attending: Physician Assistant | Admitting: Dietician

## 2023-05-30 ENCOUNTER — Encounter: Payer: Self-pay | Admitting: Dietician

## 2023-05-30 ENCOUNTER — Ambulatory Visit: Admitting: Orthopedic Surgery

## 2023-05-30 DIAGNOSIS — M48061 Spinal stenosis, lumbar region without neurogenic claudication: Secondary | ICD-10-CM

## 2023-05-30 DIAGNOSIS — E118 Type 2 diabetes mellitus with unspecified complications: Secondary | ICD-10-CM | POA: Insufficient documentation

## 2023-05-30 DIAGNOSIS — M47816 Spondylosis without myelopathy or radiculopathy, lumbar region: Secondary | ICD-10-CM | POA: Diagnosis not present

## 2023-05-30 DIAGNOSIS — G629 Polyneuropathy, unspecified: Secondary | ICD-10-CM

## 2023-05-30 DIAGNOSIS — R200 Anesthesia of skin: Secondary | ICD-10-CM

## 2023-05-30 DIAGNOSIS — M5136 Other intervertebral disc degeneration, lumbar region with discogenic back pain only: Secondary | ICD-10-CM

## 2023-05-30 NOTE — Progress Notes (Signed)
 Diabetes Self-Management Education  Visit Type: Follow-up  Appt. Start Time: 1025 Appt. End Time: 1100  05/30/2023  [name], identified by name and date of birth, is a 58 y.o. male with a diagnosis of Diabetes:  .   ASSESSMENT Pt reports elevated A1c since last visit, restarted on Ozempic  a month ago, Metformin  increased to 2000 mg daily, and Lantus  increased to 35u taken at night (was previously taking in the morning). Pt reports increase in GI distress since starting Ozempic , pt thinks it could be related to increase in metformin  when starting Ozempic . Pt reports increase in floaters in their vision, will be going back to eye doctor on Tuesday. Pt continues to use Dexcom G7, glucose control has improved significantly since increase/addition of DM meds CGM Results from download:   Average glucose:   199 mg/dL for 90 days  Glucose management indicator:   8.1 %  Time in range (70-180 mg/dL):   39 %   (Goal >16%)  Time High (181-250 mg/dL):   46 %   (Goal < 10%)  Time Very High (>250 mg/dL):    15 %   (Goal < 5%)  Time Low (54-69 mg/dL):   0 %   (Goal <9%)  Time Very Low (<54 mg/dL):   0 %   (Goal <6%)   Pt reports starting to eat healthier frozen meals (Lean Cuisine, Healthy Choice), states they have been trying to watch their sugar intake.   Diabetes Self-Management Education - 05/30/23 1200       Visit Information   Visit Type Follow-up      Pre-Education Assessment   Patient understands the diabetes disease and treatment process. Needs Review    Patient understands incorporating nutritional management into lifestyle. Needs Review    Patient undertands incorporating physical activity into lifestyle. Needs Review    Patient understands using medications safely. Needs Review    Patient understands monitoring blood glucose, interpreting and using results Needs Review    Patient understands prevention, detection, and treatment of acute complications. Needs Review    Patient  understands prevention, detection, and treatment of chronic complications. Needs Review    Patient understands how to develop strategies to address psychosocial issues. Needs Review    Patient understands how to develop strategies to promote health/change behavior. Needs Review      Complications   Last HgB A1C per patient/outside source 11.2 %   04/21/2023   How often do you check your blood sugar? > 4 times/day    Fasting Blood glucose range (mg/dL) 045-409    Postprandial Blood glucose range (mg/dL) >811;914-782    Number of hyperglycemic episodes ( >200mg /dL): Daily    Can you tell when your blood sugar is high? No      Dietary Intake   Lunch Lean Cuisine pizza    Dinner 3 slices [employer]esar pepperoni pizza, can of soda    Snack (evening) PB and J on honey wheat    Beverage(s) Water, Kool-Aid (made w/ equal), Soda      Patient Education   Healthy Eating Carbohydrate counting    Medications Reviewed patients medication for diabetes, action, purpose, timing of dose and side effects.;Taught/reviewed insulin /injectables, injection, site rotation, insulin /injectables storage and needle disposal.    Monitoring Taught/evaluated CGM (comment)    Chronic complications Relationship between chronic complications and blood glucose control    Diabetes Stress and Support Role of stress on diabetes    Lifestyle and Health Coping Lifestyle issues that need to  be addressed for better diabetes care      Individualized Goals (developed by patient)   Nutrition Carb counting    Medications take my medication as prescribed    Monitoring  Consistenly use CGM    Reducing Risk examine blood glucose patterns    Health Coping Ask for help with psychological, social, or emotional issues      Patient Self-Evaluation of Goals - Patient rates self as meeting previously set goals (% of time)   Nutrition 25 - 50% (sometimes)    Physical Activity < 25% (hardly ever/never)    Medications >75% (most of the time)     Monitoring >75% (most of the time)    Problem Solving and behavior change strategies  25 - 50% (sometimes)    Reducing Risk (treating acute and chronic complications) 25 - 50% (sometimes)    Health Coping 25 - 50% (sometimes)      Post-Education Assessment   Patient understands the diabetes disease and treatment process. Comprehends key points    Patient understands incorporating nutritional management into lifestyle. Needs Review    Patient undertands incorporating physical activity into lifestyle. Needs Review    Patient understands using medications safely. Demonstrates understanding / competency    Patient understands monitoring blood glucose, interpreting and using results Comprehends key points    Patient understands prevention, detection, and treatment of acute complications. Comprehends key points    Patient understands prevention, detection, and treatment of chronic complications. Needs Review    Patient understands how to develop strategies to address psychosocial issues. Needs Review    Patient understands how to develop strategies to promote health/change behavior. Needs Review      Outcomes   Expected Outcomes Demonstrated interest in learning but significant barriers to change    Future DMSE 2 months    Program Status Not Completed             Individualized Plan for Diabetes Self-Management Training:   Learning Objective:  Patient will have a greater understanding of diabetes self-management. Patient education plan is to attend individual and/or group sessions per assessed needs and concerns.   Plan:   Patient Instructions  Keep in mind that one serving of carbohydrates is 15 grams!   Continue to work on moderating the carbohydrates in your diet! Try to keep your carbohydrates from frozen meals to under 45g per meal.  Look for your blood sugar to rise no more than 40-60 points after a meal.  Keep different fruits in your house to have as a snack in the  evening!    Expected Outcomes:  Demonstrated interest in learning but significant barriers to change  Education material provided: CGM Overview, Carb counting List  If problems or questions, patient to contact team via:  Phone and Email  Future DSME appointment: 2 months

## 2023-05-30 NOTE — Patient Instructions (Addendum)
 Keep in mind that one serving of carbohydrates is 15 grams!   Continue to work on moderating the carbohydrates in your diet! Try to keep your carbohydrates from frozen meals to under 45g per meal.  Look for your blood sugar to rise no more than 40-60 points after a meal.  Keep different fruits in your house to have as a snack in the evening!

## 2023-06-02 ENCOUNTER — Ambulatory Visit: Payer: Medicare HMO | Admitting: Orthopedic Surgery

## 2023-06-03 ENCOUNTER — Other Ambulatory Visit (HOSPITAL_COMMUNITY): Payer: Self-pay

## 2023-06-03 ENCOUNTER — Other Ambulatory Visit: Payer: Self-pay

## 2023-06-03 ENCOUNTER — Ambulatory Visit (INDEPENDENT_AMBULATORY_CARE_PROVIDER_SITE_OTHER)

## 2023-06-03 VITALS — BP 107/66 | HR 103 | Resp 16 | Ht 69.0 in | Wt 277.1 lb

## 2023-06-03 DIAGNOSIS — E0822 Diabetes mellitus due to underlying condition with diabetic chronic kidney disease: Secondary | ICD-10-CM | POA: Diagnosis not present

## 2023-06-03 DIAGNOSIS — E559 Vitamin D deficiency, unspecified: Secondary | ICD-10-CM

## 2023-06-03 DIAGNOSIS — N1831 Chronic kidney disease, stage 3a: Secondary | ICD-10-CM | POA: Diagnosis not present

## 2023-06-03 DIAGNOSIS — J449 Chronic obstructive pulmonary disease, unspecified: Secondary | ICD-10-CM

## 2023-06-03 DIAGNOSIS — Z794 Long term (current) use of insulin: Secondary | ICD-10-CM

## 2023-06-03 DIAGNOSIS — N185 Chronic kidney disease, stage 5: Secondary | ICD-10-CM | POA: Diagnosis not present

## 2023-06-03 DIAGNOSIS — I120 Hypertensive chronic kidney disease with stage 5 chronic kidney disease or end stage renal disease: Secondary | ICD-10-CM

## 2023-06-03 DIAGNOSIS — J4541 Moderate persistent asthma with (acute) exacerbation: Secondary | ICD-10-CM | POA: Diagnosis not present

## 2023-06-03 DIAGNOSIS — J301 Allergic rhinitis due to pollen: Secondary | ICD-10-CM

## 2023-06-03 DIAGNOSIS — E782 Mixed hyperlipidemia: Secondary | ICD-10-CM | POA: Diagnosis not present

## 2023-06-03 DIAGNOSIS — E118 Type 2 diabetes mellitus with unspecified complications: Secondary | ICD-10-CM | POA: Diagnosis not present

## 2023-06-03 MED ORDER — ATORVASTATIN CALCIUM 20 MG PO TABS
20.0000 mg | ORAL_TABLET | Freq: Every day | ORAL | 3 refills | Status: AC
  Filled 2023-06-03 – 2023-07-24 (×2): qty 90, 90d supply, fill #0
  Filled 2023-10-28: qty 90, 90d supply, fill #1
  Filled 2024-02-11: qty 90, 90d supply, fill #2

## 2023-06-03 MED ORDER — MONTELUKAST SODIUM 10 MG PO TABS
10.0000 mg | ORAL_TABLET | Freq: Every day | ORAL | 3 refills | Status: AC
  Filled 2023-06-03: qty 90, 90d supply, fill #0
  Filled 2023-08-13 – 2023-09-25 (×2): qty 90, 90d supply, fill #1
  Filled 2024-01-02: qty 90, 90d supply, fill #2

## 2023-06-03 MED ORDER — BUDESONIDE-FORMOTEROL FUMARATE 160-4.5 MCG/ACT IN AERO
2.0000 | INHALATION_SPRAY | Freq: Two times a day (BID) | RESPIRATORY_TRACT | 11 refills | Status: AC
  Filled 2023-06-03 – 2023-12-19 (×2): qty 10.2, 30d supply, fill #0

## 2023-06-03 NOTE — Progress Notes (Signed)
 Established Patient Office Visit  Subjective   Patient ID: Bradley Mckenzie., male    DOB: 1965/11/14  Age: 58 y.o. MRN: [id number]  Chief Complaint  Patient presents with   Flank Pain    Pain in the left lower abdomen started Fri. Would rate at that time 8/10 on pain scale. Rates today 3/10. Also hurts across lower back. Unsure if its from his back or kidneys.    Cramping    Has been having more cramps all over and pickle juice not helping     HPI Pt is here today for lower abdominal pain pain that radiates to his lower back.  He has also been experiencing cramping.     Past Medical History:  Diagnosis Date   Allergy    Asthma    Cardiomyopathy (HCC) 2016   a. 01/2014 Echo: EF 25-30%;  b. ? ischemic vs non-ischemic.  He's never had an ischemic eval.   Chronic combined systolic (congestive) and diastolic (congestive) heart failure (HCC)    a. 01/2014 Echo: EF 25-30%. b. Echo 03/2015: Improved EF of 40-45%   COPD (chronic obstructive pulmonary disease) (HCC)    Depression    Diabetes mellitus without complication (HCC) 2018   started med therapy approx 2018   Hyperlipidemia    Hypertension    Hypertensive cardiomyopathy (HCC) 06/30/2015   Hypertensive heart disease    a. Since his 65's.   Morbid obesity (HCC)    Obstructive sleep apnea    a. Has not used CPAP since ~ 2012.   Sleep apnea    Past Surgical History:  Procedure Laterality Date   HERNIA REPAIR  2004      ROS    Objective:     BP 107/66   Pulse (!) 103   Resp 16   Ht 5\' 9"  (1.753 m)   Wt 277 lb 1.9 oz (125.7 kg)   SpO2 92%   BMI 40.92 kg/m  BP Readings from Last 3 Encounters:  06/03/23 107/66  04/21/23 125/74  04/10/23 133/78   Wt Readings from Last 3 Encounters:  06/03/23 277 lb 1.9 oz (125.7 kg)  04/21/23 281 lb (127.5 kg)  04/10/23 283 lb 9.6 oz (128.6 kg)      Physical Exam Vitals and nursing note reviewed.  Constitutional:      Appearance: Normal appearance.  HENT:     Head:  Normocephalic.     Right Ear: Tympanic membrane, ear canal and external ear normal.     Left Ear: Tympanic membrane, ear canal and external ear normal.     Nose: Nose normal.     Mouth/Throat:     Mouth: Mucous membranes are moist.     Pharynx: Oropharynx is clear.  Cardiovascular:     Rate and Rhythm: Normal rate and regular rhythm.  Pulmonary:     Effort: Pulmonary effort is normal.     Breath sounds: Normal breath sounds.  Abdominal:     General: Abdomen is flat. Bowel sounds are normal.     Tenderness: There is no right CVA tenderness or left CVA tenderness.  Musculoskeletal:     Cervical back: Normal range of motion and neck supple.     Lumbar back: Tenderness present. No deformity. Decreased range of motion. Negative right straight leg raise test and negative left straight leg raise test.  Skin:    General: Skin is warm and dry.  Neurological:     Mental Status: He is alert and oriented to person,  place, and time.  Psychiatric:        Mood and Affect: Mood normal.        Thought Content: Thought content normal.    The 10-year ASCVD risk score (Arnett DK, et al., 2019) is: 14.7%    Assessment & Plan:   Problem List Items Addressed This Visit       Respiratory   Asthmatic bronchitis   Stable at this time he denies current exacerbations.  Continues to follow-up with pulmonary. Medications renewed at this time.      Relevant Medications   montelukast  (SINGULAIR ) 10 MG tablet   budesonide -formoterol  (SYMBICORT ) 160-4.5 MCG/ACT inhaler   Seasonal allergic rhinitis due to pollen - Primary   Stable with current medication. Singulair  renewed      Relevant Medications   montelukast  (SINGULAIR ) 10 MG tablet   Chronic obstructive pulmonary disease, unspecified COPD type (HCC)   Stable with current medications.  Symbicort  and Singulair  renewed.  Follow-up with pulmonary as needed.      Relevant Medications   montelukast  (SINGULAIR ) 10 MG tablet   budesonide -formoterol   (SYMBICORT ) 160-4.5 MCG/ACT inhaler     Endocrine   Diabetes mellitus due to underlying condition with stage 3a chronic kidney disease, with long-term current use of insulin  (HCC)   He reports some improvement of his blood sugar levels.  His CGM today showed:  73% TIR 26% HIGH 1% VERY HIGH 169 Avg BS Recheck labs today. Current medications were renewed.   Follow-up in 3 months or sooner if labs indicate      Relevant Medications   atorvastatin  (LIPITOR) 20 MG tablet   Other Relevant Orders   HgB A1c (Completed)   Urine Microalbumin w/creat. ratio (Completed)   CMP14+EGFR (Completed)   B12 (Completed)     Genitourinary   CKD stage 3a, GFR 45-59 ml/min (HCC)   He is currently prescribed losartan  and Farxiga .  No medication changes are indicated today.  Recheck labs today.  Follow-up according to results      RESOLVED: Hypertensive kidney disease with CKD (chronic kidney disease) stage V (HCC)     Other   Mixed hyperlipidemia (Chronic)   Relevant Medications   atorvastatin  (LIPITOR) 20 MG tablet   Other Relevant Orders   Lipid panel (Completed)   CMP14+EGFR (Completed)   Vitamin D  deficiency   Recheck levels.      Relevant Orders   Vitamin D  (25 hydroxy) (Completed)    No follow-ups on file.    Alison Irvine, FNP

## 2023-06-04 LAB — VITAMIN D 25 HYDROXY (VIT D DEFICIENCY, FRACTURES): Vit D, 25-Hydroxy: 16.6 ng/mL — ABNORMAL LOW (ref 30.0–100.0)

## 2023-06-04 LAB — VITAMIN B12: Vitamin B-12: 634 pg/mL (ref 232–1245)

## 2023-06-05 ENCOUNTER — Other Ambulatory Visit: Payer: Self-pay

## 2023-06-05 ENCOUNTER — Ambulatory Visit: Payer: Self-pay

## 2023-06-05 DIAGNOSIS — J449 Chronic obstructive pulmonary disease, unspecified: Secondary | ICD-10-CM | POA: Insufficient documentation

## 2023-06-05 DIAGNOSIS — I120 Hypertensive chronic kidney disease with stage 5 chronic kidney disease or end stage renal disease: Secondary | ICD-10-CM | POA: Insufficient documentation

## 2023-06-05 DIAGNOSIS — J301 Allergic rhinitis due to pollen: Secondary | ICD-10-CM | POA: Insufficient documentation

## 2023-06-05 LAB — MICROALBUMIN / CREATININE URINE RATIO
Creatinine, Urine: 97.3 mg/dL
Microalb/Creat Ratio: 61 mg/g{creat} — ABNORMAL HIGH (ref 0–29)
Microalbumin, Urine: 59 ug/mL

## 2023-06-05 LAB — CMP14+EGFR
ALT: 27 IU/L (ref 0–44)
AST: 22 IU/L (ref 0–40)
Albumin: 4.4 g/dL (ref 3.8–4.9)
Alkaline Phosphatase: 113 IU/L (ref 44–121)
BUN/Creatinine Ratio: 8 — ABNORMAL LOW (ref 9–20)
BUN: 13 mg/dL (ref 6–24)
Bilirubin Total: 0.3 mg/dL (ref 0.0–1.2)
CO2: 19 mmol/L — ABNORMAL LOW (ref 20–29)
Calcium: 9.6 mg/dL (ref 8.7–10.2)
Chloride: 101 mmol/L (ref 96–106)
Creatinine, Ser: 1.66 mg/dL — ABNORMAL HIGH (ref 0.76–1.27)
Globulin, Total: 3.1 g/dL (ref 1.5–4.5)
Glucose: 151 mg/dL — ABNORMAL HIGH (ref 70–99)
Potassium: 4.1 mmol/L (ref 3.5–5.2)
Sodium: 142 mmol/L (ref 134–144)
Total Protein: 7.5 g/dL (ref 6.0–8.5)
eGFR: 48 mL/min/{1.73_m2} — ABNORMAL LOW (ref >59)

## 2023-06-05 LAB — LIPID PANEL
Chol/HDL Ratio: 3.6 ratio (ref 0.0–5.0)
Cholesterol, Total: 142 mg/dL (ref 100–199)
HDL: 40 mg/dL (ref >39)
LDL Chol Calc (NIH): 83 mg/dL (ref 0–99)
Triglycerides: 101 mg/dL (ref 0–149)
VLDL Cholesterol Cal: 19 mg/dL (ref 5–40)

## 2023-06-05 LAB — HEMOGLOBIN A1C
Est. average glucose Bld gHb Est-mCnc: 249 mg/dL
Hgb A1c MFr Bld: 10.3 % — ABNORMAL HIGH (ref 4.8–5.6)

## 2023-06-05 MED ORDER — VITAMIN D (ERGOCALCIFEROL) 1.25 MG (50000 UNIT) PO CAPS
50000.0000 [IU] | ORAL_CAPSULE | ORAL | 5 refills | Status: DC
  Filled 2023-06-05: qty 5, 35d supply, fill #0
  Filled 2023-06-16 – 2023-07-09 (×2): qty 5, 35d supply, fill #1
  Filled 2023-08-11: qty 5, 35d supply, fill #2
  Filled 2023-09-18: qty 5, 35d supply, fill #3
  Filled 2023-10-20: qty 5, 35d supply, fill #4
  Filled 2023-11-16 – 2023-11-19 (×2): qty 5, 35d supply, fill #5

## 2023-06-05 NOTE — Assessment & Plan Note (Signed)
 Recheck levels

## 2023-06-05 NOTE — Assessment & Plan Note (Addendum)
 Stable with current medications.  Symbicort  and Singulair  renewed.  Follow-up with pulmonary as needed.

## 2023-06-05 NOTE — Assessment & Plan Note (Signed)
 Stable with current medication. Singulair  renewed

## 2023-06-05 NOTE — Assessment & Plan Note (Signed)
 He is currently prescribed losartan  and Farxiga .  No medication changes are indicated today.  Recheck labs today.  Follow-up according to results

## 2023-06-05 NOTE — Assessment & Plan Note (Signed)
 Stable at this time he denies current exacerbations.  Continues to follow-up with pulmonary. Medications renewed at this time.

## 2023-06-05 NOTE — Assessment & Plan Note (Signed)
 He reports some improvement of his blood sugar levels.  His CGM today showed:  73% TIR 26% HIGH 1% VERY HIGH 169 Avg BS Recheck labs today. Current medications were renewed.   Follow-up in 3 months or sooner if labs indicate

## 2023-06-16 ENCOUNTER — Other Ambulatory Visit: Payer: Self-pay

## 2023-06-16 ENCOUNTER — Encounter (INDEPENDENT_AMBULATORY_CARE_PROVIDER_SITE_OTHER): Payer: Self-pay | Admitting: *Deleted

## 2023-06-18 ENCOUNTER — Other Ambulatory Visit: Payer: Self-pay

## 2023-06-18 DIAGNOSIS — E1122 Type 2 diabetes mellitus with diabetic chronic kidney disease: Secondary | ICD-10-CM | POA: Diagnosis not present

## 2023-06-18 DIAGNOSIS — E1165 Type 2 diabetes mellitus with hyperglycemia: Secondary | ICD-10-CM | POA: Diagnosis not present

## 2023-06-18 DIAGNOSIS — Z794 Long term (current) use of insulin: Secondary | ICD-10-CM | POA: Diagnosis not present

## 2023-06-18 DIAGNOSIS — R809 Proteinuria, unspecified: Secondary | ICD-10-CM | POA: Diagnosis not present

## 2023-06-18 DIAGNOSIS — E1129 Type 2 diabetes mellitus with other diabetic kidney complication: Secondary | ICD-10-CM | POA: Diagnosis not present

## 2023-06-18 DIAGNOSIS — E1159 Type 2 diabetes mellitus with other circulatory complications: Secondary | ICD-10-CM | POA: Diagnosis not present

## 2023-06-18 DIAGNOSIS — E1140 Type 2 diabetes mellitus with diabetic neuropathy, unspecified: Secondary | ICD-10-CM | POA: Diagnosis not present

## 2023-06-18 DIAGNOSIS — E1169 Type 2 diabetes mellitus with other specified complication: Secondary | ICD-10-CM | POA: Diagnosis not present

## 2023-06-18 MED ORDER — OZEMPIC (1 MG/DOSE) 4 MG/3ML ~~LOC~~ SOPN
1.0000 mg | PEN_INJECTOR | SUBCUTANEOUS | 12 refills | Status: DC
  Filled 2023-06-18 (×2): qty 3, 28d supply, fill #0
  Filled 2023-08-12: qty 3, 28d supply, fill #1
  Filled 2023-09-29: qty 3, 28d supply, fill #2
  Filled 2023-10-27: qty 3, 28d supply, fill #3
  Filled 2023-11-25: qty 3, 28d supply, fill #4

## 2023-06-19 ENCOUNTER — Other Ambulatory Visit (HOSPITAL_COMMUNITY): Payer: Self-pay

## 2023-07-09 ENCOUNTER — Ambulatory Visit (INDEPENDENT_AMBULATORY_CARE_PROVIDER_SITE_OTHER): Payer: Self-pay | Admitting: Allergy & Immunology

## 2023-07-09 ENCOUNTER — Encounter: Payer: Self-pay | Admitting: Allergy & Immunology

## 2023-07-09 ENCOUNTER — Other Ambulatory Visit: Payer: Self-pay

## 2023-07-09 VITALS — BP 142/72 | HR 104 | Temp 98.2°F | Resp 18 | Ht 70.08 in | Wt 276.0 lb

## 2023-07-09 DIAGNOSIS — J310 Chronic rhinitis: Secondary | ICD-10-CM | POA: Diagnosis not present

## 2023-07-09 DIAGNOSIS — J4540 Moderate persistent asthma, uncomplicated: Secondary | ICD-10-CM

## 2023-07-09 DIAGNOSIS — J45909 Unspecified asthma, uncomplicated: Secondary | ICD-10-CM | POA: Diagnosis not present

## 2023-07-09 DIAGNOSIS — R1111 Vomiting without nausea: Secondary | ICD-10-CM

## 2023-07-09 DIAGNOSIS — J45998 Other asthma: Secondary | ICD-10-CM | POA: Diagnosis not present

## 2023-07-09 MED ORDER — OMEPRAZOLE 40 MG PO CPDR
40.0000 mg | DELAYED_RELEASE_CAPSULE | Freq: Every day | ORAL | 0 refills | Status: DC
  Filled 2023-07-09: qty 30, 30d supply, fill #0

## 2023-07-09 MED ORDER — FAMOTIDINE 40 MG PO TABS
40.0000 mg | ORAL_TABLET | Freq: Every day | ORAL | 0 refills | Status: DC
  Filled 2023-07-09: qty 30, 30d supply, fill #0

## 2023-07-09 NOTE — Progress Notes (Addendum)
 NEW PATIENT  Date of Service/Encounter:  07/09/23  Consult requested by: No primary care provider on file.   Assessment:   Chronic rhinitis  Moderate persistent asthma, uncomplicated - with minimal improvement following bronchodilator challenge  Vomiting  GERD  Congestive heart failure  Type 2 diabetes   Bradley Mckenzie presents for an evaluation of cough and mucus production.  I think that his cough is likely multifactorial.  Clearly he has gotten a little bit better on the ICS/LABA, but he continues to have pretty significant coughing.  His nebulizer treatment did not help at all today and in fact he had a lot more mucus production and coughing afterwards.  We did talk about GERD as a cause of some of these problems, and he going to try PPI and H2 blockade to see if this helps at all with that.  He has polypharmacy has admitted that he does not really want to start the medications, so we will see how this actually goes.  He would be a candidate for [employer].  We gave him information on this.  His last CBC was in April 2025 and showed an absolute eosinophil count of 400. Another consideration is vocal cord dysfunction, but we will see how he does from here on out to see if she need to worry about going there.   Plan/Recommendations:   1. Moderate persistent asthma, uncomplicated - Lung testing looked low today and it did NOT improve with the Xopenex nebulizer treatment.  - I would consider adding on an injectable medication to help with your asthma. - You did have elevated eosinophilics, which are cells that make asthma and allergies worse.  - Information provided for this. - A lot of your breathing might be related to allergic triggers, which the testing will help to answer. - Spacer use reviewed. - Daily controller medication(s): Symbicort  160/4.63mcg two puffs twice daily with spacer - Prior to physical activity: albuterol  2 puffs 10-15 minutes before physical activity. - Rescue  medications: albuterol  4 puffs every 4-6 hours as needed - Asthma control goals:  * Full participation in all desired activities (may need albuterol  before activity) * Albuterol  use two time or less a week on average (not counting use with activity) * Cough interfering with sleep two time or less a month * Oral steroids no more than once a year * No hospitalizations  2. Chronic rhinitis - Because of insurance stipulations, we cannot do skin testing on the same day as your first visit. - We are all working to fight this, but for now we need to do two separate visits.  - We will know more after we do testing at the next visit.  - The skin testing visit can be squeezed in at your convenience.  - Then we can make a more full plan to address all of your symptoms. - Be sure to stop your antihistamines for 3 days before this appointment.  - This might help answer some of the questions we have about all of your phlegm production.   3. Vomiting - likely a combination of mucous production and uncontrolled GERD - We will find out more about the mucous production after the testing. - For GERD, let's start omeprazole  40mg  DAILY and increase famotidine  to 40mg  DAILY. - This might help control your asthma as wel.   4. Return in about 1 week (around 07/16/2023) for SKIN TESTING (1-55). You can have the follow up appointment with Dr. Iva or a Nurse Practicioner (our Nurse  Practitioners are excellent and always have Physician oversight!).    This note in its entirety was forwarded to the Provider who requested this consultation.  Subjective:   Bradley Mckenzie. is a 58 y.o. male presenting today for evaluation of  Chief Complaint  Patient presents with   Asthma    Referred by dr. Everardo  had asthma about 9 years ago    Bradley Mckenzie. has a history of the following: Patient Active Problem List   Diagnosis Date Noted   Seasonal allergic rhinitis due to pollen 06/05/2023   Chronic  obstructive pulmonary disease, unspecified COPD type (HCC) 06/05/2023   GERD (gastroesophageal reflux disease) 12/02/2022   Colon cancer screening 12/02/2022   Mass of right axilla 12/02/2022   Asthmatic bronchitis 05/10/2022   Insomnia 11/28/2021   Wheezing 09/06/2021   Dizziness 05/17/2021   Acute asthma flare 05/17/2021   Feeling of chest tightness 02/21/2021   Health care maintenance 11/22/2020   Vitamin D  deficiency 06/15/2020   Elevated serum creatinine 06/15/2020   Left arm pain 06/15/2020   Stomach upset 09/19/2016   Somatic dysfunction of spine, lumbar 08/28/2016   Segmental dysfunction of cervical region 08/28/2016   Muscle spasm of back 08/28/2016   Gynecomastia 12/22/2015   Dental caries 12/12/2015   Medication monitoring encounter 12/12/2015   Low back pain 12/12/2015   CKD stage 3a, GFR 45-59 ml/min (HCC)    OSA on CPAP    Hypertensive cardiomyopathy (HCC) 06/30/2015   Mixed hyperlipidemia    Morbid obesity (HCC)    Diabetes mellitus due to underlying condition with stage 3a chronic kidney disease, with long-term current use of insulin  (HCC)    Sinusitis 11/11/2014   Tachycardia 09/12/2014   Chronic systolic heart failure (HCC) 06/16/2014   Obstructive sleep apnea 06/16/2014   Essential hypertension 06/16/2014   H/O respiratory system disease 02/24/2014    History obtained from: chart review and patient.  Discussed the use of AI scribe software for clinical note transcription with the patient and/or guardian, who gave verbal consent to proceed.  Bradley Mckenzie. was referred by No primary care provider on file.Bradley Mckenzie is a 58 y.o. male presenting for an evaluation of allergies and breathing problems.   Asthma/Respiratory Symptom History: He has experienced a chronic cough for the past six to seven years, which leads to disorientation and sometimes causes falls. The cough is often triggered by a sensation of something in his throat that he cannot clear,  leading to severe coughing fits. These episodes can result in vomiting, especially when he is out of breath after climbing stairs to his third-floor apartment. The vomiting sometimes contains mucus and occurs even when he hasn't eaten. He follows with [name]. His last visit was in March 2025. At that time, he was still having problems despite being on Symbicort  160mcg two puffs BID. FeNO was 56 in January 2025.   He uses Symbicort , which provides some relief, but he continues to experience frequent flares. He describes episodes where he is unable to sleep due to coughing and vomiting. He also experiences shaking of his body during severe coughing fits. He has a nebulizer at home, which he uses as needed.  He does have a history of cardiomyopathy in chronic heart failure.  This is felt to be likely due to hypertension sleep apnea.  His last visit was in March 2025 with cardiology.  At that time, he was continued on fark [employer] 10 mg daily as  well as losartan , Bystolic , and furosemide .  Allergic Rhinitis Symptom History: He takes Zyrtec  and montelukast  (Singulair ) for his symptoms. He has not undergone allergy testing. His symptoms worsened after living with his brother, who smoked and used kerosene heaters, for a year and a half. He is interested in allergy testing. He reports frequent sneezing and congestion, which exacerbate his cough. No recent use of prednisone  tablets and no frequent sinus or ear infections.   He no longer lives with his brother, who is currently in a rehab center after having his left foot amputated.  Otherwise, there is no history of other atopic diseases, including asthma, food allergies, drug allergies, stinging insect allergies, or contact dermatitis. There is no significant infectious history. Vaccinations are up to date.    Past Medical History: Patient Active Problem List   Diagnosis Date Noted   Seasonal allergic rhinitis due to pollen 06/05/2023   Chronic obstructive  pulmonary disease, unspecified COPD type (HCC) 06/05/2023   GERD (gastroesophageal reflux disease) 12/02/2022   Colon cancer screening 12/02/2022   Mass of right axilla 12/02/2022   Asthmatic bronchitis 05/10/2022   Insomnia 11/28/2021   Wheezing 09/06/2021   Dizziness 05/17/2021   Acute asthma flare 05/17/2021   Feeling of chest tightness 02/21/2021   Health care maintenance 11/22/2020   Vitamin D  deficiency 06/15/2020   Elevated serum creatinine 06/15/2020   Left arm pain 06/15/2020   Stomach upset 09/19/2016   Somatic dysfunction of spine, lumbar 08/28/2016   Segmental dysfunction of cervical region 08/28/2016   Muscle spasm of back 08/28/2016   Gynecomastia 12/22/2015   Dental caries 12/12/2015   Medication monitoring encounter 12/12/2015   Low back pain 12/12/2015   CKD stage 3a, GFR 45-59 ml/min (HCC)    OSA on CPAP    Hypertensive cardiomyopathy (HCC) 06/30/2015   Mixed hyperlipidemia    Morbid obesity (HCC)    Diabetes mellitus due to underlying condition with stage 3a chronic kidney disease, with long-term current use of insulin  (HCC)    Sinusitis 11/11/2014   Tachycardia 09/12/2014   Chronic systolic heart failure (HCC) 06/16/2014   Obstructive sleep apnea 06/16/2014   Essential hypertension 06/16/2014   H/O respiratory system disease 02/24/2014    Medication List:  Allergies as of 07/09/2023       Reactions   Spironolactone  Other (See Comments)   gynecomastia        Medication List        Accurate as of July 09, 2023 12:27 PM. If you have any questions, ask your nurse or doctor.          STOP taking these medications    Farxiga  10 MG Tabs tablet Generic drug: dapagliflozin  propanediol Stopped by: Eudell Mcphee Louis Henriette Hesser   promethazine -dextromethorphan 6.25-15 MG/5ML syrup Commonly known as: PROMETHAZINE -DM Stopped by: Marty Morton Shaggy       TAKE these medications    Accu-Chek Guide test strip Generic drug: glucose blood Check blood  sugar three times daily (Use as instructed)   Accu-Chek Guide w/Device Kit Use as directed 3 times daily (1 each by Does not apply route 3 (three) times daily.)   Blood Glucose Monitoring Suppl Devi 1 each by Does not apply route in the morning, at noon, and at bedtime. May substitute to any manufacturer covered by patient's insurance.   albuterol  108 (90 Base) MCG/ACT inhaler Commonly known as: ProAir  HFA Inhale 2 puffs into the lungs every 6 (six) hours as needed for wheezing or shortness of breath.  atorvastatin  20 MG tablet Commonly known as: LIPITOR Take 1 tablet (20 mg total) by mouth daily.   Blood Pressure Cuff Misc USE AS DIRECTED ONCE DAILY AT 12 NOON.   budesonide  0.5 MG/2ML nebulizer solution Commonly known as: Pulmicort  Take 2 mLs (0.5 mg total) by nebulization 2 (two) times daily as needed (shortness of breath or wheezing).   budesonide -formoterol  160-4.5 MCG/ACT inhaler Commonly known as: SYMBICORT  Inhale 2 puffs into the lungs 2 (two) times daily.   cetirizine  10 MG tablet Commonly known as: ZYRTEC  Take 1 tablet (10 mg total) by mouth daily.   Dexcom G7 Receiver Devi 1 each by Does not apply route continuous.   Dexcom G7 Sensor Misc 1 each by Does not apply route every 14 (fourteen) days.   famotidine  40 MG tablet Commonly known as: PEPCID  Take 1 tablet (40 mg total) by mouth daily. What changed:  medication strength how much to take when to take this Changed by: Marty Morton Shaggy   hydrALAZINE  25 MG tablet Commonly known as: APRESOLINE  Take 3 tablets (75 mg total) by mouth 2 (two) times daily. What changed: how much to take   ipratropium-albuterol  0.5-2.5 (3) MG/3ML Soln Commonly known as: DUONEB Take 3 mLs by nebulization every 4 (four) hours as needed.   Lantus  SoloStar 100 UNIT/ML Solostar Pen Generic drug: insulin  glargine Inject 31 Units into the skin daily.   losartan  100 MG tablet Commonly known as: COZAAR  Take 1 tablet (100  mg total) by mouth daily.   metFORMIN  500 MG 24 hr tablet Commonly known as: GLUCOPHAGE -XR Take 2 tablets (1,000 mg total) by mouth 2 (two) times daily with a meal.   montelukast  10 MG tablet Commonly known as: SINGULAIR  Take 1 tablet (10 mg total) by mouth at bedtime.   nebivolol  5 MG tablet Commonly known as: Bystolic  Take 1 tablet (5 mg total) by mouth daily.   omeprazole  40 MG capsule Commonly known as: PRILOSEC Take 1 capsule (40 mg total) by mouth daily. Started by: Marty Morton Shaggy   Ozempic  (0.25 or 0.5 MG/DOSE) 2 MG/3ML Sopn Generic drug: Semaglutide (0.25 or 0.5MG /DOS) Inject 0.25 mg into the skin once a week.   Ozempic  (1 MG/DOSE) 4 MG/3ML Sopn Generic drug: Semaglutide  (1 MG/DOSE) Inject 1 mg into the skin every 7 (seven) days.   pantoprazole  40 MG tablet Commonly known as: Protonix  Take 1 tablet (40 mg total) by mouth daily. Take 30-60 min before first meal of the day   potassium chloride  SA 20 MEQ tablet Commonly known as: KLOR-CON  M Take 1 tablet (20 mEq total) by mouth 2 (two) times daily.   Rightest GL300 Lancets Misc USE AS DIRECTED.   Accu-Chek Softclix Lancets lancets Use to check blood sugar three times daily   torsemide  20 MG tablet Commonly known as: DEMADEX  Take 2 tablets (40 mg total) by mouth daily. May take an extra 20mg  tablet daily as needed for worsening shortness of breath, swelling, or weight gain.   Unifine Pentips 32G X 4 MM Misc Generic drug: Insulin  Pen Needle Use as directed   BD AutoShield Duo 30G X 5 MM Misc Generic drug: Insulin  Pen Needle Use as directed daily.   Vitamin D  (Ergocalciferol ) 1.25 MG (50000 UNIT) Caps capsule Commonly known as: DRISDOL  Take 1 capsule (50,000 Units total) by mouth every 7 (seven) days.        Birth History: non-contributory  Developmental History: non-contributory  Past Surgical History: Past Surgical History:  Procedure Laterality Date   HERNIA REPAIR  2004     Family  History: Family History  Problem Relation Age of Onset   Heart disease Mother        alive & well.   Heart attack Father 88       died @ 57 of cancer.   Diabetes Father    Cancer Father        in his stomach   Cancer Sister    Cancer Paternal Uncle        strong FH malignancy multiple relavies      Social History: Bradley Mckenzie lives at home with his family.  He lives in an apartment.  There is carpeting throughout the home.  They have electric heating and central cooling.  There are no dust mite covers on the bedding.  There is no tobacco exposure.   Review of systems otherwise negative other than that mentioned in the HPI.    Objective:   Blood pressure (!) 142/72, pulse (!) 104, temperature 98.2 F (36.8 C), temperature source Temporal, resp. rate 18, height 5' 10.08 (1.78 m), weight 276 lb (125.2 kg), SpO2 95%. Body mass index is 39.51 kg/m.     Physical Exam Vitals reviewed.  Constitutional:      Appearance: He is well-developed.  HENT:     Head: Normocephalic and atraumatic.     Right Ear: Tympanic membrane, ear canal and external ear normal. No drainage, swelling or tenderness. Tympanic membrane is not injected, scarred, erythematous, retracted or bulging.     Left Ear: Tympanic membrane, ear canal and external ear normal. No drainage, swelling or tenderness. Tympanic membrane is not injected, scarred, erythematous, retracted or bulging.     Nose: No nasal deformity, septal deviation, mucosal edema or rhinorrhea.     Right Turbinates: Enlarged, swollen and pale.     Left Turbinates: Enlarged, swollen and pale.     Right Sinus: No maxillary sinus tenderness or frontal sinus tenderness.     Left Sinus: No maxillary sinus tenderness or frontal sinus tenderness.     Mouth/Throat:     Lips: Pink.     Mouth: Mucous membranes are moist. Mucous membranes are not pale and not dry.     Pharynx: Uvula midline.   Eyes:     General: Lids are normal. Allergic shiner present.         Right eye: No discharge.        Left eye: No discharge.     Conjunctiva/sclera: Conjunctivae normal.     Right eye: Right conjunctiva is not injected. No chemosis.    Left eye: Left conjunctiva is not injected. No chemosis.    Pupils: Pupils are equal, round, and reactive to light.    Cardiovascular:     Rate and Rhythm: Normal rate and regular rhythm.     Heart sounds: Normal heart sounds.  Pulmonary:     Effort: Pulmonary effort is normal. No tachypnea, accessory muscle usage or respiratory distress.     Breath sounds: Normal breath sounds. No wheezing, rhonchi or rales.  Chest:     Chest wall: No tenderness.  Abdominal:     Tenderness: There is no abdominal tenderness. There is no guarding or rebound.  Lymphadenopathy:     Head:     Right side of head: No submandibular, tonsillar or occipital adenopathy.     Left side of head: No submandibular, tonsillar or occipital adenopathy.     Cervical: No cervical adenopathy.   Skin:    General: Skin is warm.  Capillary Refill: Capillary refill takes less than 2 seconds.     Coloration: Skin is not pale.     Findings: No abrasion, erythema, petechiae or rash. Rash is not papular, urticarial or vesicular.   Neurological:     Mental Status: He is alert.   Psychiatric:        Behavior: Behavior is cooperative.      Diagnostic studies:    Spirometry: results abnormal (FEV1: 1.60/50%, FVC: 3.01/75%, FEV1/FVC: 53%).    Spirometry consistent with possible restrictive disease. Xopenex nebulizer treatment given in clinic with no improvement.  Allergy Studies: deferred due to insurance stipulations that require a separate visit for testing          Marty Shaggy, MD [facility] 

## 2023-07-09 NOTE — Patient Instructions (Addendum)
 1. Moderate persistent asthma, uncomplicated - Lung testing looked low today and it did NOT improve with the Xopenex nebulizer treatment.  - I would consider adding on an injectable medication to help with your asthma. - You did have elevated eosinophilics, which are cells that make asthma and allergies worse.  - Information provided for this. - A lot of your breathing might be related to allergic triggers, which the testing will help to answer. - Spacer use reviewed. - Daily controller medication(s): Symbicort  160/4.9mcg two puffs twice daily with spacer - Prior to physical activity: albuterol  2 puffs 10-15 minutes before physical activity. - Rescue medications: albuterol  4 puffs every 4-6 hours as needed - Asthma control goals:  * Full participation in all desired activities (may need albuterol  before activity) * Albuterol  use two time or less a week on average (not counting use with activity) * Cough interfering with sleep two time or less a month * Oral steroids no more than once a year * No hospitalizations  2. Chronic rhinitis - Because of insurance stipulations, we cannot do skin testing on the same day as your first visit. - We are all working to fight this, but for now we need to do two separate visits.  - We will know more after we do testing at the next visit.  - The skin testing visit can be squeezed in at your convenience.  - Then we can make a more full plan to address all of your symptoms. - Be sure to stop your antihistamines for 3 days before this appointment.  - This might help answer some of the questions we have about all of your phlegm production.   3. Vomiting - likely a combination of mucous production and uncontrolled GERD - We will find out more about the mucous production after the testing. - For GERD, let's start omeprazole  40mg  DAILY and increase famotidine  to 40mg  DAILY. - This might help control your asthma as wel.   4. Return in about 1 week (around  07/16/2023) for SKIN TESTING (1-55). You can have the follow up appointment with Dr. Iva or a Nurse Practicioner (our Nurse Practitioners are excellent and always have Physician oversight!).    Please inform us  of any Emergency Department visits, hospitalizations, or changes in symptoms. Call us  before going to the ED for breathing or allergy symptoms since we might be able to fit you in for a sick visit. Feel free to contact us  anytime with any questions, problems, or concerns.  It was a pleasure to meet you today!  Websites that have reliable patient information: 1. American Academy of Asthma, Allergy, and Immunology: www.aaaai.org 2. Food Allergy Research and Education (FARE): foodallergy.org 3. Mothers of Asthmatics: http://www.asthmacommunitynetwork.org 4. American College of Allergy, Asthma, and Immunology: www.acaai.org      "Like" us  on [employer] and Instagram for our latest updates!      A healthy democracy works best when [employer] participate! Make sure you are registered to vote! If you have moved or changed any of your contact information, you will need to get this updated before voting! Scan the QR codes below to learn more!

## 2023-07-09 NOTE — Addendum Note (Signed)
 Addended by: MENDEZ-MUNGARAY, Chelci [place] M on: 07/09/2023 03:13 PM   Modules accepted: Orders

## 2023-07-10 ENCOUNTER — Other Ambulatory Visit: Payer: Self-pay

## 2023-07-14 ENCOUNTER — Other Ambulatory Visit: Payer: Self-pay

## 2023-07-14 MED ORDER — AZITHROMYCIN 250 MG PO TABS
ORAL_TABLET | ORAL | 0 refills | Status: AC
  Filled 2023-07-14: qty 6, 5d supply, fill #0

## 2023-07-20 ENCOUNTER — Other Ambulatory Visit: Payer: Self-pay

## 2023-07-20 MED FILL — Insulin Glargine Soln Pen-Injector 100 Unit/ML: SUBCUTANEOUS | 87 days supply | Qty: 27 | Fill #1 | Status: AC

## 2023-07-21 ENCOUNTER — Telehealth: Payer: Self-pay | Admitting: *Deleted

## 2023-07-21 ENCOUNTER — Other Ambulatory Visit: Payer: Self-pay

## 2023-07-21 MED ORDER — AZITHROMYCIN 250 MG PO TABS
ORAL_TABLET | ORAL | 0 refills | Status: DC
Start: 2023-07-21 — End: 2023-09-03
  Filled 2023-07-21: qty 6, 5d supply, fill #0

## 2023-07-21 NOTE — Telephone Encounter (Signed)
-----   Message from Marty Morton Shaggy sent at 07/09/2023 11:54 AM EDT ----- Tamsen Hy - [facility] in April 2025

## 2023-07-21 NOTE — Telephone Encounter (Signed)
 Spoke to patient and at this time he wants to hold off on Fasenra until he gets allergy testing done

## 2023-07-24 ENCOUNTER — Other Ambulatory Visit: Payer: Self-pay

## 2023-07-24 MED FILL — Losartan Potassium Tab 100 MG: ORAL | 90 days supply | Qty: 90 | Fill #2 | Status: AC

## 2023-07-24 MED FILL — Potassium Chloride Microencapsulated Crys ER Tab 20 mEq: ORAL | 90 days supply | Qty: 180 | Fill #2 | Status: AC

## 2023-08-01 ENCOUNTER — Encounter: Payer: Self-pay | Admitting: Allergy & Immunology

## 2023-08-01 ENCOUNTER — Ambulatory Visit: Admitting: Allergy & Immunology

## 2023-08-01 DIAGNOSIS — J310 Chronic rhinitis: Secondary | ICD-10-CM

## 2023-08-01 NOTE — Progress Notes (Signed)
 FOLLOW UP  Date of Service/Encounter:  08/01/23   Assessment:   Chronic non-allergic rhinitis   Moderate persistent asthma, uncomplicated - with minimal improvement following bronchodilator challenge (considering Tezspire)  Adamantly refuses nose sprays   Vomiting   GERD   Congestive heart failure   Type 2 diabetes    Plan/Recommendations:   1. Moderate persistent asthma, uncomplicated - Lung testing not done today. - Consider adding on Tezspire, which might help with the twitchy airways and decreased mucous production and help with your asthma control. - Handout provided.  - Daily controller medication(s): Symbicort  160/4.36mcg two puffs twice daily with spacer - Prior to physical activity: albuterol  2 puffs 10-15 minutes before physical activity. - Rescue medications: albuterol  4 puffs every 4-6 hours as needed - Asthma control goals:  * Full participation in all desired activities (may need albuterol  before activity) * Albuterol  use two time or less a week on average (not counting use with activity) * Cough interfering with sleep two time or less a month * Oral steroids no more than once a year * No hospitalizations  2. Chronic rhinitis - Testing today showed: NEGATIVE TO THE ENTIRE PANEL - Copy of test results provided.  - This may be what is known as local allergic rhinitis, which occurs when your immune system makes localized IgE (allergy antibodies) to various allergens (as opposed to systemic) IgE - Therefore, since IgE is not make systemically, allergic sensitizations are not picked up on routine skin testing. - There are specialized academic research centers that can test for IgE in the nasal cavity, but this is not done in the clinical setting at this time.  - It can still be treated with nasal sprays and antihistamines.  - Continue with: Zyrtec  (cetirizine ) 10mg  tablet once daily and Singulair  (montelukast ) 10mg  daily - You can use an extra dose of the  antihistamine, if needed, for breakthrough symptoms.  - Consider nasal saline rinses 1-2 times daily to remove allergens from the nasal cavities as well as help with mucous clearance (this is especially helpful to do before the nasal sprays are given)  3. Vomiting - likely a combination of mucous production and uncontrolled GERD - Testing was negative to allergies, as above. - Continue with omeprazole  40mg  DAILY and famotidine  40mg  DAILY.  4. Return in about 3 months (around 11/01/2023). You can have the follow up appointment with Dr. Iva or a Nurse Practicioner (our Nurse Practitioners are excellent and always have Physician oversight!).    Subjective:   Bradley Mckenzie. is a 58 y.o. male presenting today for follow up of No chief complaint on file.   Bradley Mckenzie. has a history of the following: Patient Active Problem List   Diagnosis Date Noted   Seasonal allergic rhinitis due to pollen 06/05/2023   Chronic obstructive pulmonary disease, unspecified COPD type (HCC) 06/05/2023   GERD (gastroesophageal reflux disease) 12/02/2022   Colon cancer screening 12/02/2022   Mass of right axilla 12/02/2022   Asthmatic bronchitis 05/10/2022   Insomnia 11/28/2021   Wheezing 09/06/2021   Dizziness 05/17/2021   Acute asthma flare 05/17/2021   Feeling of chest tightness 02/21/2021   Health care maintenance 11/22/2020   Vitamin D  deficiency 06/15/2020   Elevated serum creatinine 06/15/2020   Left arm pain 06/15/2020   Stomach upset 09/19/2016   Somatic dysfunction of spine, lumbar 08/28/2016   Segmental dysfunction of cervical region 08/28/2016   Muscle spasm of back 08/28/2016   Gynecomastia 12/22/2015   Dental  caries 12/12/2015   Medication monitoring encounter 12/12/2015   Low back pain 12/12/2015   CKD stage 3a, GFR 45-59 ml/min (HCC)    OSA on CPAP    Hypertensive cardiomyopathy (HCC) 06/30/2015   Mixed hyperlipidemia    Morbid obesity (HCC)    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  Discussed the use of AI scribe software for clinical note transcription with the patient and/or guardian, who gave verbal consent to proceed.  Bradley Mckenzie is a 58 y.o. male presenting for skin testing. He was last seen on June 25th. We could not do testing because his insurance company does not cover testing on the same day as a New Patient visit. He has been off of all antihistamines 3 days in anticipation of the testing.   At that visit, lung testing looked terrible and did not improve with the Xopenex treatment. We continued with the use of Symbicort  two puffs BID as well as albuterol  as needed.  For his rhinitis, we decided to do environmental allergy testing. He was on cetirizine  and montelukast . He was having some vomiting, which we felt was related to the mucous production and underlying GERD. We started him on omeprazole  40mg  daily and increased his famotidine  to 40mg  daily.   Otherwise, there have been no changes to his past medical history, surgical history, family history, or social history.    Review of systems otherwise negative other than that mentioned in the HPI.    Objective:   There were no vitals taken for this visit. There is no height or weight on file to calculate BMI.    Physical exam deferred since this was a skin testing appointment only.   Diagnostic studies:   Allergy Studies:    Airborne Adult Perc - 08/01/23 1000     Time Antigen Placed 1032    Allergen Manufacturer Jestine    Location Back    Number of Test 55    Panel 1 Select    1. Control-Buffer 50% Glycerol Negative    2.  Control-Histamine Negative    3. Bahia Negative    4. French Southern Territories Negative    5. Johnson Negative    6. [place]  Blue Negative    7. Meadow Fescue Negative    8. Perennial Rye Negative    9. Timothy Negative    10. Ragweed Mix Negative    11. Cocklebur Negative    12. Plantain,  English Negative    13. Baccharis Negative    14. Dog Fennel Negative    15. Russian Thistle Negative    16. Lamb's Quarters Negative    17. Sheep Sorrell Negative    18. Rough Pigweed Negative    19. Marsh Elder, Rough Negative    20. Mugwort, Common Negative    21. Box, Elder Negative    22. Cedar, red Negative    23. Sweet Gum Negative    24. Pecan Pollen Negative    25. Pine Mix Negative    26. Walnut, Black Pollen Negative    27. Red Mulberry Negative    28. Ash Mix Negative    29. Birch Mix Negative    30.  Beech American Negative    31. Cottonwood, Guinea-[place]issau Negative    32. Hickory, White Negative    33. Maple Mix Negative    34. Oak, [place] Mix Negative    35. Sycamore Eastern Negative    36. Alternaria Alternata Negative    37. Cladosporium Herbarum Negative    38. Aspergillus Mix Negative    39. Penicillium Mix Negative    40. Bipolaris Sorokiniana (Helminthosporium) Negative    41. Drechslera Spicifera (Curvularia) Negative    42. Mucor Plumbeus Negative    43. Fusarium Moniliforme Negative    44. Aureobasidium Pullulans (pullulara) Negative    45. Rhizopus Oryzae Negative    46. Botrytis Cinera Negative    47. Epicoccum Nigrum Negative    48. Phoma Betae Negative    49. Dust Mite Mix Negative    50. Cat Hair 10,000 BAU/ml Negative    51.  Dog Epithelia Negative    52. Mixed Feathers Negative    53. Horse Epithelia Negative    54. Cockroach, German Negative    55. Tobacco Leaf Negative          Intradermal - 08/01/23 1058     Time Antigen Placed 1058    Allergen Manufacturer Greer    Location Arm    Number of Test 16    Control Negative    Bahia Negative    French Southern Territories  Negative    Johnson Negative    7 Grass Negative    Ragweed Mix Negative    Weed Mix Negative    Tree Mix Negative    Mold 1 Negative    Mold 2 Negative    Mold 3 Negative    Mold 4 Negative    Mite Mix Negative    Cat Negative    Dog Negative    Cockroach Negative          Allergy testing results were read and interpreted by myself, documented by clinical staff.      Marty Shaggy, MD  [facility] 

## 2023-08-01 NOTE — Patient Instructions (Addendum)
 1. Moderate persistent asthma, uncomplicated - Lung testing not done today. - Consider adding on Tezspire, which might help with the twitchy airways and decreased mucous production and help with your asthma control. - Handout provided.  - Daily controller medication(s): Symbicort  160/4.10mcg two puffs twice daily with spacer - Prior to physical activity: albuterol  2 puffs 10-15 minutes before physical activity. - Rescue medications: albuterol  4 puffs every 4-6 hours as needed - Asthma control goals:  * Full participation in all desired activities (may need albuterol  before activity) * Albuterol  use two time or less a week on average (not counting use with activity) * Cough interfering with sleep two time or less a month * Oral steroids no more than once a year * No hospitalizations  2. Chronic rhinitis - Testing today showed: NEGATIVE TO THE ENTIRE PANEL - Copy of test results provided.  - This may be what is known as local allergic rhinitis, which occurs when your immune system makes localized IgE (allergy antibodies) to various allergens (as opposed to systemic) IgE - Therefore, since IgE is not make systemically, allergic sensitizations are not picked up on routine skin testing. - There are specialized academic research centers that can test for IgE in the nasal cavity, but this is not done in the clinical setting at this time.  - It can still be treated with nasal sprays and antihistamines.  - Continue with: Zyrtec  (cetirizine ) 10mg  tablet once daily and Singulair  (montelukast ) 10mg  daily - You can use an extra dose of the antihistamine, if needed, for breakthrough symptoms.  - Consider nasal saline rinses 1-2 times daily to remove allergens from the nasal cavities as well as help with mucous clearance (this is especially helpful to do before the nasal sprays are given)  3. Vomiting - likely a combination of mucous production and uncontrolled GERD - Testing was negative to allergies,  as above. - Continue with omeprazole  40mg  DAILY and famotidine  40mg  DAILY.  4. Return in about 3 months (around 11/01/2023). You can have the follow up appointment with Dr. Iva or a Nurse Practicioner (our Nurse Practitioners are excellent and always have Physician oversight!).    Please inform us  of any Emergency Department visits, hospitalizations, or changes in symptoms. Call us  before going to the ED for breathing or allergy symptoms since we might be able to fit you in for a sick visit. Feel free to contact us  anytime with any questions, problems, or concerns.  It was a pleasure to meet you today!  Websites that have reliable patient information: 1. American Academy of Asthma, Allergy, and Immunology: www.aaaai.org 2. Food Allergy Research and Education (FARE): foodallergy.org 3. Mothers of Asthmatics: http://www.asthmacommunitynetwork.org 4. American College of Allergy, Asthma, and Immunology: www.acaai.org      "Like" us  on Facebook and Instagram for our latest updates!      A healthy democracy works best when [employer] participate! Make sure you are registered to vote! If you have moved or changed any of your contact information, you will need to get this updated before voting! Scan the QR codes below to learn more!     Check out this information handout from the [employer] of Asthma, Allergy, and Immunology on RHINITIS!   [email or web address]      Airborne Adult Perc - 08/01/23 1000     Time Antigen Placed 1032    Allergen Manufacturer Greer    Location Back    Number of Test 55    Panel 1 Select  1. Control-Buffer 50% Glycerol Negative    2. Control-Histamine Negative    3. Bahia Negative    4. [place] Territ[place] Negative    5. Johnson Negative    6. [place]  Blue Negative    7. Meadow Fescue Negative    8. Perennial Rye Negative    9. Timothy Negative    10. Ragweed Mix Negative    11. Cocklebur Negative    12. Plantain,  English Negative     13. Baccharis Negative    14. Dog Fennel Negative    15. Russian Thistle Negative    16. Lamb's Quarters Negative    17. Sheep Sorrell Negative    18. Rough Pigweed Negative    19. Marsh Elder, Rough Negative    20. Mugwort, Common Negative    21. Box, Elder Negative    22. Cedar, red Negative    23. Sweet Gum Negative    24. Pecan Pollen Negative    25. Pine Mix Negative    26. Walnut, Black Pollen Negative    27. Red Mulberry Negative    28. Ash Mix Negative    29. Birch Mix Negative    30. Beech American Negative    31. Cottonwood, [place]iss[place] Negative    32. Hickory, White Negative    33. Maple Mix Negative    34. Oak, [place] Mix Negative    35. Sycamore Eastern Negative    36. Alternaria Alternata Negative    37. Cladosporium Herbarum Negative    38. Aspergillus Mix Negative    39. Penicillium Mix Negative    40. Bipolaris Sorokiniana (Helminthosporium) Negative    41. Drechslera Spicifera (Curvularia) Negative    42. Mucor Plumbeus Negative    43. Fusarium Moniliforme Negative    44. Aureobasidium Pullulans (pullulara) Negative    45. Rhizopus Oryzae Negative    46. Botrytis Cinera Negative    47. Epicoccum Nigrum Negative    48. Phoma Betae Negative    49. Dust Mite Mix Negative    50. Cat Hair 10,000 BAU/ml Negative    51.  Dog Epithelia Negative    52. Mixed Feathers Negative    53. Horse Epithelia Negative    54. Cockroach, German Negative    55. Tobacco Leaf Negative          Intradermal - 08/01/23 1058     Time Antigen Placed 1058    Allergen Manufacturer Greer    Location Arm    Number of Test 16    Control Negative    Bahia Negative    [place] Territ[place] Negative    Johnson Negative    7 Grass Negative    Ragweed Mix Negative    Weed Mix Negative    Tree Mix Negative    Mold 1 Negative    Mold 2 Negative    Mold 3 Negative    Mold 4 Negative    Mite Mix Negative    Cat Negative    Dog Negative    Cockroach Negative

## 2023-08-06 NOTE — Progress Notes (Signed)
 I saw Bradley Mckenzie. in neurology clinic on 08/20/23 in follow up for neuropathy.  HPI: Bradley Mckenzie. is a 58 y.o. year old male with a history of DM, HTN c/b cardiomyopathy, OSA, COPD, CKD, degenerative disk disease who we last saw on 02/19/23.  To briefly review: 02/19/23: Symptoms started around 11/2021 with tingling in his feet. He went to the ED and was told this was neuropathy. He has had others tell him it was not and that it was coming from his back. He does endorse back pain that comes and goes. He has been seen by NSGY and sent for EMG. EMG done by me on 12/17/22 showed at least a moderate large fiber sensorimotor polyneuropathy.   Patient has numbness in the bottom of his feet but not significant pain. He has taken gabapentin  in the past, but not currently.   He has stiffness of hands and occasional numbness, but denies significant tingling, burning, or pain in his upper extremities.   He denies imbalance or falls. He endorses cramps and spasms throughout his body from neck to ankles.   The patient denies symptoms suggestive of oculobulbar weakness including diplopia, ptosis, dysphagia, poor saliva control, dysarthria/dysphonia, impaired mastication, facial weakness/droop.   The patient does not report definitive symptoms referable to autonomic dysfunction including impaired sweating, heat or cold intolerance, excessive mucosal dryness, gastroparetic early satiety, postprandial abdominal bloating, constipation, bowel or bladder dyscontrol, or syncope/presyncope/orthostatic intolerance.    He does endorse erectile dysfunction.   Of note, patient does have history of cardiomyopathy. Patient is not aware of why.   He does not report any constitutional symptoms like fever, night sweats, anorexia or unintentional weight loss.   EtOH use: None  Restrictive diet? No Family history of neuropathy/myopathy/neurologic disease or cardiomyopathy? Not known  Most recent Assessment and  Plan (02/19/23): Bradley Mckenzie. is a 58 y.o. male who presents for evaluation of numbness in bilateral feet. He has a relevant medical history of DM, HTN c/b cardiomyopathy, OSA, COPD, CKD, degenerative disk disease. His neurological examination is pertinent for vibration sensation loss in bilateral great toes, but otherwise normal. Available diagnostic data is significant for HbA1c of 9.0 and B12 in 2022 that was borderline low. He is not currently taking B12. 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 EMG of bilateral lower limbs on 12/17/22 showed evidence of an axonal large fiber neuropathy, moderate in degree electrically. His known risk factors include poorly controlled DM and possible B12 deficiency. I will send labs to look for other treatable causes. hTTR amyloidosis is a thought given he is AA with neuropathy and hx of cardiomyopathy, however, he does not have significant autonomic symptoms and per latest echo, heart function looked okay.   PLAN: -Blood work: B1, B12, IFE, kappa/lambda light chains -No current significant autonomic symptoms and cardiomyopathy does not seem significant per latest echo, but may consider hTTR amyloidosis testing in the future if these become more concerning -Alpha lipoic acid 600 mg once or twice daily -Discussed importance of good DM control  Since their last visit: B12 was borderline low. I recommended B12 1000 mcg daily for this. He is alpha lipoic acid 600 mg daily.   Patient feels like he didn't have significant symptoms until the last week. He has some tingling and burning in the last week. He has tried gabapentin  in the past. He thinks this helped, but does not well remember and he did not take it long.  He denies  significant pain or tingling in his hands (carpal tunnel symptoms).   He denies difficulty sweating, early satiety, changes to bowel or bladder. He endorses erectile dysfunction, but this is a result of his  BP medication per patient.   MEDICATIONS:  Outpatient Encounter Medications as of 08/20/2023  Medication Sig   Accu-Chek Softclix Lancets lancets Use to check blood sugar three times daily   albuterol  (PROAIR  HFA) 108 (90 Base) MCG/ACT inhaler Inhale 2 puffs into the lungs every 6 (six) hours as needed for wheezing or shortness of breath.   atorvastatin  (LIPITOR) 20 MG tablet Take 1 tablet (20 mg total) by mouth daily.   Blood Glucose Monitoring Suppl (ACCU-CHEK GUIDE) w/Device KIT 1 each by Does not apply route 3 (three) times daily.   Blood Glucose Monitoring Suppl DEVI 1 each by Does not apply route in the morning, at noon, and at bedtime. May substitute to any manufacturer covered by patient's insurance.   Blood Pressure Monitoring (BLOOD PRESSURE CUFF) MISC USE AS DIRECTED ONCE DAILY AT 12 NOON.   budesonide  (PULMICORT ) 0.5 MG/2ML nebulizer solution Take 2 mLs (0.5 mg total) by nebulization 2 (two) times daily as needed (shortness of breath or wheezing).   budesonide -formoterol  (SYMBICORT ) 160-4.5 MCG/ACT inhaler Inhale 2 puffs into the lungs 2 (two) times daily.   cetirizine  (ZYRTEC ) 10 MG tablet Take 1 tablet (10 mg total) by mouth daily.   Continuous Glucose Receiver (DEXCOM G7 RECEIVER) DEVI 1 each by Does not apply route continuous.   Continuous Glucose Sensor (DEXCOM G7 SENSOR) MISC 1 each by Does not apply route every 14 (fourteen) days.   famotidine  (PEPCID ) 40 MG tablet Take 1 tablet (40 mg total) by mouth daily.   glucose blood (ACCU-CHEK GUIDE) test strip Use as instructed   hydrALAZINE  (APRESOLINE ) 25 MG tablet Take 3 tablets (75 mg total) by mouth 2 (two) times daily. (Patient taking differently: Take 25 mg by mouth 2 (two) times daily.)   insulin  glargine (LANTUS  SOLOSTAR) 100 UNIT/ML Solostar Pen Inject 31 Units into the skin daily.   Insulin  Pen Needle 30G X 5 MM MISC Use as directed daily.   Insulin  Pen Needle 32G X 4 MM MISC Use as directed   ipratropium-albuterol  (DUONEB)  0.5-2.5 (3) MG/3ML SOLN Take 3 mLs by nebulization every 4 (four) hours as needed.   losartan  (COZAAR ) 100 MG tablet Take 1 tablet (100 mg total) by mouth daily.   metFORMIN  (GLUCOPHAGE -XR) 500 MG 24 hr tablet Take 2 tablets (1,000 mg total) by mouth 2 (two) times daily with a meal.   montelukast  (SINGULAIR ) 10 MG tablet Take 1 tablet (10 mg total) by mouth at bedtime.   nebivolol  (BYSTOLIC ) 5 MG tablet Take 1 tablet (5 mg total) by mouth daily.   omeprazole  (PRILOSEC) 40 MG capsule Take 1 capsule (40 mg total) by mouth daily.   pantoprazole  (PROTONIX ) 40 MG tablet Take 1 tablet (40 mg total) by mouth daily. Take 30-60 min before first meal of the day   potassium chloride  SA (KLOR-CON  M) 20 MEQ tablet Take 1 tablet (20 mEq total) by mouth 2 (two) times daily.   Rightest GL300 Lancets MISC USE AS DIRECTED.   Semaglutide , 1 MG/DOSE, (OZEMPIC , 1 MG/DOSE,) 4 MG/3ML SOPN Inject 1 mg into the skin every 7 (seven) days.   Semaglutide ,0.25 or 0.5MG /DOS, (OZEMPIC , 0.25 OR 0.5 MG/DOSE,) 2 MG/3ML SOPN Inject 0.25 mg into the skin once a week.   torsemide  (DEMADEX ) 20 MG tablet Take 2 tablets (40 mg total) by  mouth daily. May take an extra 20mg  tablet daily as needed for worsening shortness of breath, swelling, or weight gain.   Vitamin D , Ergocalciferol , (DRISDOL ) 1.25 MG (50000 UNIT) CAPS capsule Take 1 capsule (50,000 Units total) by mouth every 7 (seven) days.   azithromycin  (ZITHROMAX ) 250 MG tablet Take 2 tablets by mouth on day one. Then 1 tablet daily for days 2-5 (Patient not taking: Reported on 08/20/2023)   [DISCONTINUED] famotidine  (PEPCID ) 40 MG tablet Take 1 tablet (40 mg total) by mouth daily.   [DISCONTINUED] omeprazole  (PRILOSEC) 40 MG capsule Take 1 capsule (40 mg total) by mouth daily.   No facility-administered encounter medications on file as of 08/20/2023.    PAST MEDICAL HISTORY: Past Medical History:  Diagnosis Date   Allergy    Asthma    Cardiomyopathy (HCC) 2016   a. 01/2014 Echo:  EF 25-30%;  b. ? ischemic vs non-ischemic.  He's never had an ischemic eval.   Chronic combined systolic (congestive) and diastolic (congestive) heart failure (HCC)    a. 01/2014 Echo: EF 25-30%. b. Echo 03/2015: Improved EF of 40-45%   COPD (chronic obstructive pulmonary disease) (HCC)    Depression    Diabetes mellitus without complication (HCC) 2018   started med therapy approx 2018   Hyperlipidemia    Hypertension    Hypertensive cardiomyopathy (HCC) 06/30/2015   Hypertensive heart disease    a. Since his 59's.   Morbid obesity (HCC)    Obstructive sleep apnea    a. Has not used CPAP since ~ 2012.   Sleep apnea     PAST SURGICAL HISTORY: Past Surgical History:  Procedure Laterality Date   HERNIA REPAIR  2004    ALLERGIES: Allergies  Allergen Reactions   Spironolactone  Other (See Comments)    gynecomastia    FAMILY HISTORY: Family History  Problem Relation Age of Onset   Heart disease Mother        alive & well.   Heart attack Father 56       died @ 23 of cancer.   Diabetes Father    Cancer Father        in his stomach   Cancer Sister    Cancer Paternal Uncle        strong FH malignancy multiple relavies     SOCIAL HISTORY: Social History   Tobacco Use   Smoking status: Never   Smokeless tobacco: Never   Tobacco comments:    Quit in his 60's.  Vaping Use   Vaping status: Never Used  Substance Use Topics   Alcohol use: Not Currently    Comment: rarely alcohol use at special events   Drug use: Not Currently    Comment: Used marijuana in his teens.   Social History   Social History Narrative   Lives in [place] with his wife. Wife no longer working; now on disability. Have stable housing at group home that his wife used to work at. Sometimes struggles with transportation but not desperate. Wife has working car, his doesn't work. Had food stamps but cut off after 3 months. Wasn't eligible after that bc not able to work. Has been denied twice from  disability. He was not able to obtain medicaid    Objective:  Vital Signs:  BP 123/76 (BP Location: Left Arm, Patient Position: Sitting)   Pulse (!) 102   Ht 5' 9 (1.753 m)   Wt 286 lb (129.7 kg)   SpO2 98%   BMI 42.23 kg/m  General: No acute distress.  Patient appears well-groomed.   Head:  Normocephalic/atraumatic Neck: supple Lungs: Non-labored breathing on room air   Neurological Exam: Mental status: alert and oriented, speech fluent and not dysarthric, language intact.  Cranial nerves: CN I: not tested CN II: pupils equal, round and reactive to light, visual fields intact CN III, IV, VI:  full range of motion, no nystagmus, no ptosis CN V: facial sensation intact. CN VII: upper and lower face symmetric CN VIII: hearing intact CN IX, X: uvula midline CN XI: sternocleidomastoid and trapezius muscles intact CN XII: tongue midline  Bulk & Tone: normal, no fasciculations. Motor:  muscle strength 5/5 throughout Deep Tendon Reflexes:  2+ throughout, except bilateral ankles.   Sensation:  Pinprick sensation intact. Finger to nose testing:  Without dysmetria.     Gait:  Normal station and stride.  Romberg negative.  Lab and Test Review: New results: 02/19/23: B12: 296 B1 wnl IFE: no M protein Kappa/lambda light chains: normal ratio  06/03/23: B12: 634 Vit D: low at 16.6 CMP significant for glucose 151, Cr 1.66 Lipid panel: tChol 142, LDL 83, TG 101 HbA1c: 10.3  Previously reviewed results: 12/31/22: HbA1c: 9.0 Lipid panel: tChol 144, LDL 83, TG 87   Vit D (11/14/21): 32.6 (wnl) B12 (04/20/20): 297   Imaging: Echocardiogram (09/23/22):  1. Left ventricular ejection fraction, by estimation, is 55 to 60%. The  left ventricle has normal function. The left ventricle has no regional  wall motion abnormalities. There is moderate left ventricular hypertrophy.  Left ventricular diastolic parameters are consistent with Grade I diastolic dysfunction (impaired   relaxation).   2. Right ventricular systolic function is normal. The right ventricular  size is normal. Tricuspid regurgitation signal is inadequate for assessing  PA pressure.   3. The mitral valve is normal in structure. Trivial mitral valve  regurgitation. No evidence of mitral stenosis.   4. The aortic valve is normal in structure. Aortic valve regurgitation is  not visualized. No aortic stenosis is present.    MRI lumbar spine wo contrast (10/16/22): FINDINGS: Segmentation: 5 lumbar type vertebral bodies as numbered previously.   Alignment:  Normal   Vertebrae:  No fracture or focal bone lesion.   Conus medullaris and cauda equina: Conus extends to the L1 level. Conus and cauda equina appear normal.   Paraspinal and other soft tissues: Negative   Disc levels:   No abnormality from T12-L1 through L2-3.   L3-4: Desiccation and mild bulging of the disc. Mild narrowing of the lateral recesses but no likely neural compression. Slight worsening since 2018.   L4-5: Desiccation and mild bulging of the disc. Mild facet and ligamentous hypertrophy. Mild stenosis of the lateral recesses and proximal foramina but no definite neural compression. Similar appearance to the prior exam.   L5-S1: Desiccation and bulging of the disc slightly more prominent towards the right. Mild facet and ligamentous hypertrophy. Mild narrowing of the right lateral recess but no visible neural compression. Similar appearance to the prior exam.   IMPRESSION: 1. L3-4: Disc bulge. Mild narrowing of the lateral recesses but no likely neural compression. Slight worsening since 2018. 2. L4-5: Disc bulge. Mild facet and ligamentous hypertrophy. Mild stenosis of the lateral recesses and proximal foramina but no definite neural compression. Similar appearance to the prior exam. 3. L5-S1: Disc bulge slightly more prominent towards the right. Mild facet and ligamentous hypertrophy. Mild narrowing of the  right lateral recess but no visible neural compression. Similar appearance to the  prior exam.   EMG (12/17/22): NCV & EMG Findings: Extensive electrodiagnostic evaluation of bilateral lower limbs shows: Bilateral sural and superficial peroneal/fibular sensory responses are absent. Bilateral peroneal/fibular (EDB) and tibial (Bradley) motor responses are absent. Bilateral peroneal/fibular (TA) motor responses are within normal limits. Bilateral H reflexes are absent. Chronic motor axon loss changes without accompanying active denervation changes are seen in bilateral tibialis anterior, medial head of gastrocnemius, and flexor digitorum longus muscles.   Impression: This is an abnormal study. 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 No definitive electrodiagnostic evidence of a left or right lumbosacral (L3-S1) motor radiculopathy.  ASSESSMENT: This is Bradley Mckenzie., a 58 y.o. male with numbness, tingling, and burning in feet that sound most consistent with a distal symmetric polyneuropathy with known risk factors of DM and B12 deficiency. While he has heart failure, he has no current CTS symptoms or autonomic symptoms to suggest amyloidosis, though I will continue to monitor this.  Plan: -Continue B12 supplementation -Continue Vit D supplementation (50000 units weekly) -Continue alpha lipoic acid 600 mg daily -Offered neuropathic pain medication such as gabapentin , patient deferred -Lidocaine  cream as needed  Return to clinic in 1 year  Total time spent reviewing records, interview, history/exam, documentation, and coordination of care on day of encounter:  30 min  Venetia Potters, MD

## 2023-08-13 ENCOUNTER — Other Ambulatory Visit: Payer: Self-pay | Admitting: Allergy & Immunology

## 2023-08-13 DIAGNOSIS — E1165 Type 2 diabetes mellitus with hyperglycemia: Secondary | ICD-10-CM | POA: Diagnosis not present

## 2023-08-14 ENCOUNTER — Other Ambulatory Visit: Payer: Self-pay

## 2023-08-14 MED ORDER — OMEPRAZOLE 40 MG PO CPDR
40.0000 mg | DELAYED_RELEASE_CAPSULE | Freq: Every day | ORAL | 5 refills | Status: AC
  Filled 2023-08-14: qty 30, 30d supply, fill #0
  Filled 2023-09-25: qty 30, 30d supply, fill #1
  Filled 2023-10-28: qty 30, 30d supply, fill #2
  Filled 2023-12-03: qty 30, 30d supply, fill #3
  Filled 2023-12-20 – 2024-01-02 (×2): qty 30, 30d supply, fill #4
  Filled 2024-01-30: qty 30, 30d supply, fill #5

## 2023-08-14 MED ORDER — FAMOTIDINE 40 MG PO TABS
40.0000 mg | ORAL_TABLET | Freq: Every day | ORAL | 5 refills | Status: AC
  Filled 2023-08-14: qty 30, 30d supply, fill #0
  Filled 2023-09-25: qty 30, 30d supply, fill #1
  Filled 2023-10-28: qty 30, 30d supply, fill #2
  Filled 2023-12-03: qty 30, 30d supply, fill #3
  Filled 2023-12-20 – 2024-01-02 (×2): qty 30, 30d supply, fill #4
  Filled 2024-02-11: qty 30, 30d supply, fill #5

## 2023-08-20 ENCOUNTER — Ambulatory Visit: Payer: Medicare HMO | Admitting: Neurology

## 2023-08-20 ENCOUNTER — Encounter: Payer: Self-pay | Admitting: Neurology

## 2023-08-20 VITALS — BP 123/76 | HR 102 | Ht 69.0 in | Wt 286.0 lb

## 2023-08-20 DIAGNOSIS — E559 Vitamin D deficiency, unspecified: Secondary | ICD-10-CM | POA: Diagnosis not present

## 2023-08-20 DIAGNOSIS — R202 Paresthesia of skin: Secondary | ICD-10-CM | POA: Diagnosis not present

## 2023-08-20 DIAGNOSIS — G629 Polyneuropathy, unspecified: Secondary | ICD-10-CM

## 2023-08-20 DIAGNOSIS — E538 Deficiency of other specified B group vitamins: Secondary | ICD-10-CM | POA: Diagnosis not present

## 2023-08-20 DIAGNOSIS — E118 Type 2 diabetes mellitus with unspecified complications: Secondary | ICD-10-CM | POA: Diagnosis not present

## 2023-08-20 DIAGNOSIS — I429 Cardiomyopathy, unspecified: Secondary | ICD-10-CM | POA: Diagnosis not present

## 2023-08-20 NOTE — Patient Instructions (Addendum)
-  Continue B12 supplementation (1000 mcg daily) -Continue Vit D supplementation (50000 units weekly) -Continue alpha lipoic acid 600 mg once or twice daily  You can also try Lidocaine  cream as needed. Apply wear you have pain, tingling, or burning. Wear gloves to prevent your hands being numb. This can be bought over the counter at any drug store or online.  If you decide you want medication for tingling or burning such as gabapentin , please let me know.  I will see you again in 1 year or sooner if needed. Please let me know if you have any questions or concerns in the meantime.   The physicians and staff at [facility] are committed to providing excellent care. You may receive a survey requesting feedback about your experience at our office. We strive to receive very good responses to the survey questions. If you feel that your experience would prevent you from giving the office a very good  response, please contact our office to try to remedy the situation. We may be reached at [phone]. Thank you for taking the time out of your busy day to complete the survey.  Venetia Potters, MD [facility]

## 2023-08-26 NOTE — Progress Notes (Unsigned)
 Referring Physician:  Melvenia Manus BRAVO, MD [address] [place],  [place] [address]  Primary Physician:  Melvenia Manus BRAVO, MD  History of Present Illness: [name] has a history of heart failure, HTN, hypertensive cardiomyopathy, OSA, COPD, CKD 2, and DM.   Had phone visit with me on 05/30/23 for intermittent LBP. He has known lumbar spondylosis L3-S1 with DDD. He has mild lateral recess stenosis bilaterally at L3-L4 and L4-L5 with mild lateral recess stenosis on right at L5-S1. No significant compression noted.    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 No lumbar radiculopathy.   He was doing reasonable at his last visit. He was seeing neurology for his neuropathy and working with PCP to get his DM under better control.   He is here for follow up.   He is about the same. He has good days and bad days with intermittent LBP that is tolerable. He still has numbness in left heel that is more constant. Overall he is doing well.    He is working with his PCP and endocrine to get his DM under better control. Last HgbA1c on 06/03/23 was 10.3. he is seeing nutrition.    Conservative measures:  Physical therapy: he did PT in 1992 and 2019, doing HEP that I sent him.  Multimodal medical therapy including regular antiinflammatories: mobic , robaxin , motrin , naprosyn   Injections: No epidural steroid injections   Past Surgery: No spinal surgery   Bradley Mckenzie. has no symptoms of cervical myelopathy.   Review of Systems:  A 10 point review of systems is negative, except for the pertinent positives and negatives detailed in the HPI.  Past Medical History: Past Medical History:  Diagnosis Date   Allergy    Asthma    Cardiomyopathy (HCC) 2016   a. 01/2014 Echo: EF 25-30%;  b. ? ischemic vs non-ischemic.  He's never had an ischemic eval.   Chronic combined systolic (congestive) and diastolic  (congestive) heart failure (HCC)    a. 01/2014 Echo: EF 25-30%. b. Echo 03/2015: Improved EF of 40-45%   COPD (chronic obstructive pulmonary disease) (HCC)    Depression    Diabetes mellitus without complication (HCC) 2018   started med therapy approx 2018   Hyperlipidemia    Hypertension    Hypertensive cardiomyopathy (HCC) 06/30/2015   Hypertensive heart disease    a. Since his 34's.   Morbid obesity (HCC)    Obstructive sleep apnea    a. Has not used CPAP since ~ 2012.   Sleep apnea     Past Surgical History: Past Surgical History:  Procedure Laterality Date   HERNIA REPAIR  2004    Allergies: Allergies as of 09/03/2023 - Review Complete 09/03/2023  Allergen Reaction Noted   Spironolactone  Other (See Comments) 03/25/2023    Medications: Outpatient Encounter Medications as of 09/03/2023  Medication Sig   Accu-Chek Softclix Lancets lancets Use to check blood sugar three times daily   albuterol  (PROAIR  HFA) 108 (90 Base) MCG/ACT inhaler Inhale 2 puffs into the lungs every 6 (six) hours as needed for wheezing or shortness of breath.   atorvastatin  (LIPITOR) 20 MG tablet Take 1 tablet (20 mg total) by mouth daily.   Blood Glucose Monitoring Suppl (ACCU-CHEK GUIDE) w/Device KIT 1 each by Does not apply route 3 (three) times daily.   Blood Glucose Monitoring Suppl DEVI 1 each by Does not apply route in  the morning, at noon, and at bedtime. May substitute to any manufacturer covered by patient's insurance.   Blood Pressure Monitoring (BLOOD PRESSURE CUFF) MISC USE AS DIRECTED ONCE DAILY AT 12 NOON.   budesonide  (PULMICORT ) 0.5 MG/2ML nebulizer solution Take 2 mLs (0.5 mg total) by nebulization 2 (two) times daily as needed (shortness of breath or wheezing).   budesonide -formoterol  (SYMBICORT ) 160-4.5 MCG/ACT inhaler Inhale 2 puffs into the lungs 2 (two) times daily.   cetirizine  (ZYRTEC ) 10 MG tablet Take 1 tablet (10 mg total) by mouth daily.   Continuous Glucose Receiver (DEXCOM  G7 RECEIVER) DEVI 1 each by Does not apply route continuous.   Continuous Glucose Sensor (DEXCOM G7 SENSOR) MISC 1 each by Does not apply route every 14 (fourteen) days.   famotidine  (PEPCID ) 40 MG tablet Take 1 tablet (40 mg total) by mouth daily.   glucose blood (ACCU-CHEK GUIDE) test strip Use as instructed   hydrALAZINE  (APRESOLINE ) 25 MG tablet Take 3 tablets (75 mg total) by mouth 2 (two) times daily. (Patient taking differently: Take 25 mg by mouth 2 (two) times daily.)   insulin  glargine (LANTUS  SOLOSTAR) 100 UNIT/ML Solostar Pen Inject 31 Units into the skin daily.   Insulin  Pen Needle 30G X 5 MM MISC Use as directed daily.   Insulin  Pen Needle 32G X 4 MM MISC Use as directed   ipratropium-albuterol  (DUONEB) 0.5-2.5 (3) MG/3ML SOLN Take 3 mLs by nebulization every 4 (four) hours as needed.   losartan  (COZAAR ) 100 MG tablet Take 1 tablet (100 mg total) by mouth daily.   metFORMIN  (GLUCOPHAGE -XR) 500 MG 24 hr tablet Take 2 tablets (1,000 mg total) by mouth 2 (two) times daily with a meal.   montelukast  (SINGULAIR ) 10 MG tablet Take 1 tablet (10 mg total) by mouth at bedtime.   nebivolol  (BYSTOLIC ) 5 MG tablet Take 1 tablet (5 mg total) by mouth daily.   omeprazole  (PRILOSEC) 40 MG capsule Take 1 capsule (40 mg total) by mouth daily.   potassium chloride  SA (KLOR-CON  M) 20 MEQ tablet Take 1 tablet (20 mEq total) by mouth 2 (two) times daily.   Rightest GL300 Lancets MISC USE AS DIRECTED.   Semaglutide , 1 MG/DOSE, (OZEMPIC , 1 MG/DOSE,) 4 MG/3ML SOPN Inject 1 mg into the skin every 7 (seven) days.   Semaglutide ,0.25 or 0.5MG /DOS, (OZEMPIC , 0.25 OR 0.5 MG/DOSE,) 2 MG/3ML SOPN Inject 0.25 mg into the skin once a week.   torsemide  (DEMADEX ) 20 MG tablet Take 2 tablets (40 mg total) by mouth daily. May take an extra 20mg  tablet daily as needed for worsening shortness of breath, swelling, or weight gain.   Vitamin D , Ergocalciferol , (DRISDOL ) 1.25 MG (50000 UNIT) CAPS capsule Take 1 capsule (50,000  Units total) by mouth every 7 (seven) days.   [DISCONTINUED] azithromycin  (ZITHROMAX ) 250 MG tablet Take 2 tablets by mouth on day one. Then 1 tablet daily for days 2-5 (Patient not taking: Reported on 08/20/2023)   [DISCONTINUED] pantoprazole  (PROTONIX ) 40 MG tablet Take 1 tablet (40 mg total) by mouth daily. Take 30-60 min before first meal of the day   No facility-administered encounter medications on file as of 09/03/2023.    Social History: Social History   Tobacco Use   Smoking status: Never   Smokeless tobacco: Never   Tobacco comments:    Quit in his 13's.  Vaping Use   Vaping status: Never Used  Substance Use Topics   Alcohol use: Not Currently    Comment: rarely alcohol use at special  events   Drug use: Not Currently    Comment: Used marijuana in his teens.    Family Medical History: Family History  Problem Relation Age of Onset   Heart disease Mother        alive & well.   Heart attack Father 3       died @ 49 of cancer.   Diabetes Father    Cancer Father        in his stomach   Cancer Sister    Cancer Paternal Uncle        strong FH malignancy multiple relavies     Physical Examination: Vitals:   09/03/23 1031  BP: (!) 142/86    Awake, alert, oriented to person, place, and time.  Speech is clear and fluent. Fund of knowledge is appropriate.   Cranial Nerves: Pupils equal round and reactive to light.  Facial tone is symmetric.    No abnormal lesions on exposed skin.   Strength:  Side Iliopsoas Quads Hamstring PF DF EHL  R 5 5 5 5 5 5   L 5 5 5 5 5 5    Reflexes are 2+ and symmetric at the patella and achilles.    Clonus is not present.   Bilateral lower extremity sensation is intact to light touch.  Gait is normal.     Medical Decision Making  Imaging: None   Assessment and Plan: Mr. Brunsman is a pleasant 58 y.o. male with a history of chronic LBP x years.     He has intermittent LBP with good days and bad days. Pain is tolerable. He has  numbness in left heel that is more constant.   Overall, he is doing well.    He has known lumbar spondylosis L3-S1 with DDD. He has mild lateral recess stenosis bilaterally at L3-L4 and L4-L5 with mild lateral recess stenosis on right at L5-S1. No significant compression noted.    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 No lumbar radiculopathy.    Treatment options discussed with patient and following plan made:    - Continue to optimize his blood sugars with PCP and endocrine.  - Continue HEP for his lower back that I gave him.  - If LBP gets worse, can revisit formal PT. Can also consider injections, but he hates needles.  - He will f/u with me prn.   BP was elevated. No symptoms of chest pain, shortness of breath, blurry vision, or headaches. He has not taken his blood pressure medications yet. He will take these and check BP when he gets home.  He will call PCP if not improved. If he develops CP, SOB, blurry vision, or headaches, then he will go to ED.     I spent a total of 15 minutes in face-to-face and non-face-to-face activities related to this patient's care today including review of outside records, review of imaging, review of symptoms, physical exam, discussion of differential diagnosis, discussion of treatment options, and documentation.   Glade Boys PA-C Dept. of Neurosurgery

## 2023-09-03 ENCOUNTER — Other Ambulatory Visit: Payer: Self-pay | Admitting: Physician Assistant

## 2023-09-03 ENCOUNTER — Ambulatory Visit: Admitting: Orthopedic Surgery

## 2023-09-03 ENCOUNTER — Encounter: Payer: Self-pay | Admitting: Orthopedic Surgery

## 2023-09-03 ENCOUNTER — Encounter: Attending: Physician Assistant | Admitting: Dietician

## 2023-09-03 ENCOUNTER — Other Ambulatory Visit: Payer: Self-pay

## 2023-09-03 ENCOUNTER — Encounter: Payer: Self-pay | Admitting: Dietician

## 2023-09-03 VITALS — BP 142/86 | Ht 69.0 in | Wt 285.4 lb

## 2023-09-03 DIAGNOSIS — E118 Type 2 diabetes mellitus with unspecified complications: Secondary | ICD-10-CM | POA: Insufficient documentation

## 2023-09-03 DIAGNOSIS — Z713 Dietary counseling and surveillance: Secondary | ICD-10-CM | POA: Insufficient documentation

## 2023-09-03 DIAGNOSIS — M48061 Spinal stenosis, lumbar region without neurogenic claudication: Secondary | ICD-10-CM | POA: Diagnosis not present

## 2023-09-03 DIAGNOSIS — G629 Polyneuropathy, unspecified: Secondary | ICD-10-CM

## 2023-09-03 DIAGNOSIS — M47817 Spondylosis without myelopathy or radiculopathy, lumbosacral region: Secondary | ICD-10-CM | POA: Diagnosis not present

## 2023-09-03 DIAGNOSIS — M5137 Other intervertebral disc degeneration, lumbosacral region with discogenic back pain only: Secondary | ICD-10-CM | POA: Diagnosis not present

## 2023-09-03 DIAGNOSIS — M5136 Other intervertebral disc degeneration, lumbar region with discogenic back pain only: Secondary | ICD-10-CM

## 2023-09-03 DIAGNOSIS — M47816 Spondylosis without myelopathy or radiculopathy, lumbar region: Secondary | ICD-10-CM

## 2023-09-03 NOTE — Patient Instructions (Addendum)
 Desired Ranges on GCM Report: Very Low (below 54 mg/dL): <8% Low (below 70 mg/dL): <5% Time in Range (70 - 180 mg/dL): >29% High (819 - 749 mg/dL): <74% Very High (above 250 mg/dL): <4%   Keep up the good work!

## 2023-09-03 NOTE — Progress Notes (Signed)
 Diabetes Self-Management Education  Visit Type: Follow-up  Appt. Start Time: 1150 Appt. End Time: 1230  09/03/2023  Bradley Mckenzie, identified by name and date of birth, is a 58 y.o. male with a diagnosis of Diabetes:  .   ASSESSMENT Pt continues to use Dexcom G7, glycemic control continues to improve on CGM report CGM Results from download:   Average glucose:   169 mg/dL for 90 days  Glucose management indicator:   7.4 %  Time in range (70-180 mg/dL):   65 %   (Goal >29%)  Time High (181-250 mg/dL):   30 %   (Goal < 74%)  Time Very High (>250 mg/dL):    4 %   (Goal < 5%)  Time Low (54-69 mg/dL):   0 %   (Goal <5%)  Time Very Low (<54 mg/dL):   0 %   (Goal <8%)   Pt reports no change in DM medications, getting A1c checked next week. Pt reports continuing to work on reducing sugar intake, especially with beverages, states they have a sweet tooth that sometimes leads them to snack if sweets are in the house. Pt reports continuing to eat healthier frozen meals for dinner consistently. Pt reports overall improvement in low back pain, states they are being slightly more active but still has to monitor activity due to DOE/SOB.   Diabetes Self-Management Education - 09/03/23 1303       Visit Information   Visit Type Follow-up      Psychosocial Assessment   Learning Readiness Change in progress      Pre-Education Assessment   Patient understands the diabetes disease and treatment process. Comprehends key points    Patient understands incorporating nutritional management into lifestyle. Comprehends key points    Patient undertands incorporating physical activity into lifestyle. Comprehends key points    Patient understands using medications safely. Demonstrates understanding / competency    Patient understands monitoring blood glucose, interpreting and using results Comprehends key points    Patient understands prevention, detection, and treatment of acute complications. Comprehends  key points    Patient understands prevention, detection, and treatment of chronic complications. Compreheands key points    Patient understands how to develop strategies to address psychosocial issues. Comprehends key points    Patient understands how to develop strategies to promote health/change behavior. Needs Review      Complications   Last HgB A1C per patient/outside source 10.3 %    How often do you check your blood sugar? > 4 times/day    Fasting Blood glucose range (mg/dL) 869-820    Postprandial Blood glucose range (mg/dL) 869-820;>799;819-799      Dietary Intake   Lunch 2 tuna sandwiches    Dinner Frozen roasted turkey bowl w/ carrots, green beans, potatoes    Beverage(s) Water, sugar-free Kool-Aid      Activity / Exercise   Activity / Exercise Type ADL's;Light (walking / raking leaves)      Patient Education   Healthy Eating Role of diet in the treatment of diabetes and the relationship between the three main macronutrients and blood glucose level    Monitoring Taught/evaluated CGM (comment);Identified appropriate SMBG and/or A1C goals.    Chronic complications Relationship between chronic complications and blood glucose control;Lipid levels, blood glucose control and heart disease      Individualized Goals (developed by patient)   Nutrition Follow meal plan discussed    Medications take my medication as prescribed    Monitoring  Consistenly use CGM  Reducing Risk examine blood glucose patterns      Patient Self-Evaluation of Goals - Patient rates self as meeting previously set goals (% of time)   Nutrition 25 - 50% (sometimes)    Physical Activity < 25% (hardly ever/never)    Medications >75% (most of the time)    Monitoring >75% (most of the time)    Problem Solving and behavior change strategies  25 - 50% (sometimes)    Reducing Risk (treating acute and chronic complications) 25 - 50% (sometimes)    Health Coping 25 - 50% (sometimes)      Post-Education  Assessment   Patient understands the diabetes disease and treatment process. Comprehends key points    Patient understands incorporating nutritional management into lifestyle. Comprehends key points    Patient undertands incorporating physical activity into lifestyle. Comprehends key points    Patient understands using medications safely. Demonstrates understanding / competency    Patient understands monitoring blood glucose, interpreting and using results Comprehends key points    Patient understands prevention, detection, and treatment of acute complications. Comprehends key points    Patient understands prevention, detection, and treatment of chronic complications. Comprehends key points    Patient understands how to develop strategies to address psychosocial issues. Comprehends key points    Patient understands how to develop strategies to promote health/change behavior. Needs Review      Outcomes   Expected Outcomes Demonstrated interest in learning. Expect positive outcomes    Future DMSE 2 months    Program Status Not Completed      Subsequent Visit   Since your last visit have you continued or begun to take your medications as prescribed? Yes    Since your last visit have you had your blood pressure checked? Yes    Is your most recent blood pressure lower, unchanged, or higher since your last visit? Higher    Since your last visit have you experienced any weight changes? Gain    Weight Gain (lbs) 9    Since your last visit, are you checking your blood glucose at least once a day? Yes   CGM         Individualized Plan for Diabetes Self-Management Training:   Learning Objective:  Patient will have a greater understanding of diabetes self-management. Patient education plan is to attend individual and/or group sessions per assessed needs and concerns.   Plan:   Patient Instructions  Desired Ranges on GCM Report: Very Low (below 54 mg/dL): <8% Low (below 70 mg/dL): <5% Time  in Range (70 - 180 mg/dL): >29% High (819 - 749 mg/dL): <74% Very High (above 250 mg/dL): <4%   Keep up the good work!  Expected Outcomes:  Demonstrated interest in learning. Expect positive outcomes  If problems or questions, patient to contact team via:  Phone and Email  Future DSME appointment: 2 months

## 2023-09-04 ENCOUNTER — Other Ambulatory Visit: Payer: Self-pay

## 2023-09-04 ENCOUNTER — Other Ambulatory Visit: Payer: Self-pay | Admitting: Internal Medicine

## 2023-09-04 MED FILL — "Insulin Pen Needle 30 G X 5 MM (1/5"" or 3/16"")": 100 days supply | Qty: 100 | Fill #0 | Status: AC

## 2023-09-16 LAB — HEMOGLOBIN A1C: Hemoglobin A1C: 8.4

## 2023-09-18 DIAGNOSIS — E1159 Type 2 diabetes mellitus with other circulatory complications: Secondary | ICD-10-CM | POA: Diagnosis not present

## 2023-09-18 DIAGNOSIS — E1165 Type 2 diabetes mellitus with hyperglycemia: Secondary | ICD-10-CM | POA: Diagnosis not present

## 2023-09-18 DIAGNOSIS — E1122 Type 2 diabetes mellitus with diabetic chronic kidney disease: Secondary | ICD-10-CM | POA: Diagnosis not present

## 2023-09-18 DIAGNOSIS — E1129 Type 2 diabetes mellitus with other diabetic kidney complication: Secondary | ICD-10-CM | POA: Diagnosis not present

## 2023-09-18 DIAGNOSIS — E1169 Type 2 diabetes mellitus with other specified complication: Secondary | ICD-10-CM | POA: Diagnosis not present

## 2023-09-18 DIAGNOSIS — Z794 Long term (current) use of insulin: Secondary | ICD-10-CM | POA: Diagnosis not present

## 2023-09-18 DIAGNOSIS — E1140 Type 2 diabetes mellitus with diabetic neuropathy, unspecified: Secondary | ICD-10-CM | POA: Diagnosis not present

## 2023-09-18 DIAGNOSIS — R809 Proteinuria, unspecified: Secondary | ICD-10-CM | POA: Diagnosis not present

## 2023-09-25 ENCOUNTER — Other Ambulatory Visit: Payer: Self-pay | Admitting: Family

## 2023-09-26 ENCOUNTER — Telehealth: Payer: Self-pay

## 2023-09-26 ENCOUNTER — Other Ambulatory Visit: Payer: Self-pay

## 2023-09-26 MED ORDER — TORSEMIDE 20 MG PO TABS
40.0000 mg | ORAL_TABLET | Freq: Every day | ORAL | 0 refills | Status: DC
  Filled 2023-09-26: qty 60, 30d supply, fill #0

## 2023-09-26 NOTE — Telephone Encounter (Signed)
 Left message for patient to call clinic to schedule overdue f/u appt.

## 2023-10-20 MED FILL — Insulin Glargine Soln Pen-Injector 100 Unit/ML: SUBCUTANEOUS | 87 days supply | Qty: 27 | Fill #2 | Status: CN

## 2023-10-21 ENCOUNTER — Other Ambulatory Visit: Payer: Self-pay

## 2023-10-21 ENCOUNTER — Other Ambulatory Visit: Payer: Self-pay | Admitting: Internal Medicine

## 2023-10-21 MED ORDER — LANTUS SOLOSTAR 100 UNIT/ML ~~LOC~~ SOPN
31.0000 [IU] | PEN_INJECTOR | Freq: Every day | SUBCUTANEOUS | 1 refills | Status: AC
  Filled 2023-10-21: qty 27, 87d supply, fill #0
  Filled 2024-01-27: qty 27, 87d supply, fill #1

## 2023-10-27 ENCOUNTER — Other Ambulatory Visit: Payer: Self-pay

## 2023-10-28 ENCOUNTER — Other Ambulatory Visit: Payer: Self-pay | Admitting: Family

## 2023-10-28 ENCOUNTER — Other Ambulatory Visit: Payer: Self-pay | Admitting: Adult Health

## 2023-10-28 ENCOUNTER — Other Ambulatory Visit: Payer: Self-pay

## 2023-10-28 DIAGNOSIS — I10 Essential (primary) hypertension: Secondary | ICD-10-CM

## 2023-10-28 DIAGNOSIS — I5022 Chronic systolic (congestive) heart failure: Secondary | ICD-10-CM

## 2023-10-28 MED FILL — Losartan Potassium Tab 100 MG: ORAL | 90 days supply | Qty: 90 | Fill #3 | Status: AC

## 2023-10-28 MED FILL — Potassium Chloride Microencapsulated Crys ER Tab 20 mEq: ORAL | 90 days supply | Qty: 180 | Fill #3 | Status: AC

## 2023-10-29 ENCOUNTER — Other Ambulatory Visit: Payer: Self-pay

## 2023-10-29 MED FILL — Nebivolol HCl Tab 5 MG (Base Equivalent): ORAL | 30 days supply | Qty: 30 | Fill #0 | Status: AC

## 2023-10-29 MED FILL — Hydralazine HCl Tab 25 MG: ORAL | 30 days supply | Qty: 60 | Fill #0 | Status: AC

## 2023-11-10 NOTE — Progress Notes (Signed)
 Bradley Luginbill Jr.                                          MRN: [id number]   11/10/2023   The [facility] Quality Team Specialist reviewed this patient medical record for the purposes of chart review for care gap closure. The following were reviewed: abstraction for care gap closure-glycemic status assessment.    [facility] Quality Team

## 2023-11-11 NOTE — Patient Instructions (Signed)
 Asthma Get a chest xray Continue Symbicort  160-2 puffs twice a day with a spacer to prevent cough or wheeze Continue albuterol  2 puffs once every 4 hours if needed for cough or wheeze You may use albuterol  2 puffs 5-15 minutes before activity to decrease cough or wheeze   Nonallergic rhinitis Continue montelukast  10 mg once a day for control of symptoms of rhinitis Continue cetirizine  10 mg once a day if needed for runny nose or itch.  You may take an additional dose of cetirizine  10 mg once a day for breakthrough symptoms  Reflux/vomit Continue dietary and lifestyle modifications as listed below Continue omeprazole  40 mg daily for control of reflux.  Take this medication 30 to 60 minutes before your first meal for best results Continue famotidine  40 mg once a day for control of reflux. Consider referral to GI for uncontrolled reflux in the setting of diabetes  Call the clinic if this treatment plan is not working well for you.  Follow up in 2 months or sooner if needed.

## 2023-11-11 NOTE — Progress Notes (Signed)
 [address] [place] [address] Dept: [phone]  FOLLOW UP NOTE  Patient ID: Bradley Waddell Raddle., male    DOB: [birth year]  Age: 58 y.o. MRN: [id number] Date of Office Visit: 11/12/2023  Assessment  Chief Complaint: Follow-up (Nasal and chest congestion. Having problem breathing.)  HPI Bradley Gamble Enderle. is a 58 year old male who presents to the clinic for follow-up visit.  He was last seen in this clinic on 08/01/2023 by Dr. Iva for evaluation of asthma, nonallergic rhinitis, reflux, and vomiting.  His current problem list includes congestive heart failure and diabetes type 2.    Discussed the use of AI scribe software for clinical note transcription with the patient, who gave verbal consent to proceed.  History of Present Illness Bradley Ayodele Sangalang. is a 58 year old male who presents with worsening breathing difficulties and congestion.  He has experienced significant breathing difficulties over the past week and a half, characterized by both nasal and chest congestion. Symptoms initially improved towards the end of last week but worsened again. He has been unable to perform house chores due to difficulty breathing and has used his nebulizer two to three times during this period with relief of symptoms.  He experiences shortness of breath both at rest and with activity, accompanied by frequent coughing. The coughing has been persistent for about five years, often leading to vomiting. During severe coughing fits, he feels disoriented, his body shakes, and he feels limp. He does not typically examine the sputum he coughs up. Wheezing occurs at various times without a specific pattern, and he sometimes finds it difficult to sleep due to breathing issues. He uses Symbicort  160, taking two puffs twice daily.  He also experiences reflux and heartburn, particularly at night after taking his medications. He was switched from Protonix  to omeprazole , which he takes 30 minutes  before a meal, and famotidine  at night. He sometimes needs to take Tums to alleviate heartburn, which occurs sporadically, most recently three nights ago.  Nasal symptoms include a runny and stuffy nose, and sneezing, which were particularly severe earlier last week and improving into this week. His throat was sore over the weekend due to coughing, and he was unable to talk. No fever, but he mentions feeling body aches and a change in his usual temperature regulation, as he has not been using his fan to stay cool. He continues an antihistamine as needed and is not currently using any nasal steroid sprays or nasal saline rinse. His last environmental allergy skin testing on 08/01/2023 was negative to the adult environmental panel.  His current medications are listed in the chart.   Drug Allergies:  Allergies  Allergen Reactions   Spironolactone  Other (See Comments)    gynecomastia    Physical Exam: BP 130/70 (BP Location: Left Arm, Patient Position: Sitting, Cuff Size: Normal)   Pulse (!) 104   Temp (!) 97.5 F (36.4 C) (Temporal)   Resp 18   Ht 5' 8.9 (1.75 m)   Wt 282 lb (127.9 kg)   SpO2 92%   BMI 41.77 kg/m    Physical Exam Vitals reviewed.  Constitutional:      Appearance: Normal appearance.  HENT:     Head: Normocephalic and atraumatic.     Right Ear: Tympanic membrane normal.     Left Ear: Tympanic membrane normal.     Nose:     Comments: Bilateral naris edematous and erythematous with thin clear nasal drainage noted.  Pharynx normal.  Ears  normal.  Eyes normal.    Mouth/Throat:     Pharynx: Oropharynx is clear.  Eyes:     Conjunctiva/sclera: Conjunctivae normal.  Cardiovascular:     Rate and Rhythm: Normal rate and regular rhythm.     Heart sounds: Normal heart sounds. No murmur heard. Pulmonary:     Effort: Pulmonary effort is normal.     Comments: Bilateral expiratory wheeze with slight inspiratory wheeze which improved but did not clear after postbronchodilator  therapy Musculoskeletal:        General: Normal range of motion.     Cervical back: Normal range of motion and neck supple.  Skin:    General: Skin is warm and dry.  Neurological:     Mental Status: He is alert and oriented to person, place, and time.  Psychiatric:        Mood and Affect: Mood normal.        Behavior: Behavior normal.        Thought Content: Thought content normal.        Judgment: Judgment normal.     Diagnostics: FVC 2.28 which is 56% of predicted value, FEV1 1.20 which is 37% of predicted value.  Spirometry indicates airway obstruction.  Postbronchodilator therapy with 10% improvement in FEV1.  Assessment and Plan: 1. SOB (shortness of breath)   2. Not well controlled asthma without complication   3. Chronic rhinitis   4. Gastroesophageal reflux disease, unspecified whether esophagitis present     Patient Instructions  Asthma Get a chest xray Continue Symbicort  160-2 puffs twice a day with a spacer to prevent cough or wheeze Continue albuterol  2 puffs once every 4 hours if needed for cough or wheeze You may use albuterol  2 puffs 5-15 minutes before activity to decrease cough or wheeze   Nonallergic rhinitis Continue montelukast  10 mg once a day for control of symptoms of rhinitis Continue cetirizine  10 mg once a day if needed for runny nose or itch.  You may take an additional dose of cetirizine  10 mg once a day for breakthrough symptoms  Reflux/vomit Continue dietary and lifestyle modifications as listed below Continue omeprazole  40 mg daily for control of reflux.  Take this medication 30 to 60 minutes before your first meal for best results Continue famotidine  40 mg once a day for control of reflux. Consider referral to GI for uncontrolled reflux in the setting of diabetes  Call the clinic if this treatment plan is not working well for you.  Follow up in 2 months or sooner if needed.  Return in about 2 months (around 01/12/2024), or if symptoms  worsen or fail to improve.    Thank you for the opportunity to care for this patient.  Please do not hesitate to contact me with questions.  Arlean Mutter, FNP [facility] 

## 2023-11-12 ENCOUNTER — Ambulatory Visit: Admitting: Family Medicine

## 2023-11-12 ENCOUNTER — Encounter: Payer: Self-pay | Admitting: Family Medicine

## 2023-11-12 VITALS — BP 130/70 | HR 104 | Temp 97.5°F | Resp 18 | Ht 68.9 in | Wt 282.0 lb

## 2023-11-12 DIAGNOSIS — J310 Chronic rhinitis: Secondary | ICD-10-CM | POA: Insufficient documentation

## 2023-11-12 DIAGNOSIS — J45998 Other asthma: Secondary | ICD-10-CM | POA: Diagnosis not present

## 2023-11-12 DIAGNOSIS — R0602 Shortness of breath: Secondary | ICD-10-CM | POA: Insufficient documentation

## 2023-11-12 DIAGNOSIS — K219 Gastro-esophageal reflux disease without esophagitis: Secondary | ICD-10-CM

## 2023-11-12 DIAGNOSIS — J45909 Unspecified asthma, uncomplicated: Secondary | ICD-10-CM

## 2023-11-13 DIAGNOSIS — E1165 Type 2 diabetes mellitus with hyperglycemia: Secondary | ICD-10-CM | POA: Diagnosis not present

## 2023-11-17 ENCOUNTER — Other Ambulatory Visit: Payer: Self-pay

## 2023-11-25 MED FILL — "Insulin Pen Needle 30 G X 5 MM (1/5"" or 3/16"")": 100 days supply | Qty: 100 | Fill #1 | Status: AC

## 2023-11-26 ENCOUNTER — Other Ambulatory Visit: Payer: Self-pay

## 2023-11-27 ENCOUNTER — Other Ambulatory Visit: Payer: Self-pay

## 2023-12-02 ENCOUNTER — Encounter: Attending: Physician Assistant | Admitting: Dietician

## 2023-12-02 ENCOUNTER — Encounter: Payer: Self-pay | Admitting: Dietician

## 2023-12-02 DIAGNOSIS — Z713 Dietary counseling and surveillance: Secondary | ICD-10-CM | POA: Insufficient documentation

## 2023-12-02 DIAGNOSIS — E0822 Diabetes mellitus due to underlying condition with diabetic chronic kidney disease: Secondary | ICD-10-CM | POA: Insufficient documentation

## 2023-12-02 DIAGNOSIS — E118 Type 2 diabetes mellitus with unspecified complications: Secondary | ICD-10-CM

## 2023-12-02 DIAGNOSIS — N1831 Chronic kidney disease, stage 3a: Secondary | ICD-10-CM | POA: Insufficient documentation

## 2023-12-02 DIAGNOSIS — Z794 Long term (current) use of insulin: Secondary | ICD-10-CM | POA: Insufficient documentation

## 2023-12-02 NOTE — Progress Notes (Signed)
 Diabetes Self-Management Education  Visit Type: Follow-up  Appt. Start Time: 1145 Appt. End Time: 1235  12/02/2023  [name], identified by name and date of birth, is a 58 y.o. male with a diagnosis of Diabetes:  .   ASSESSMENT Pt continues to use Dexcom G7, glycemic control continues to improve on CGM report CGM Results from download:   Average glucose:   173 mg/dL for 90 days  Glucose management indicator:   7.4 %  Time in range (70-180 mg/dL):   63 %   (Goal >29%)  Time High (181-250 mg/dL):   32 %   (Goal < 74%)  Time Very High (>250 mg/dL):    4 %   (Goal < 5%)  Time Low (54-69 mg/dL):   1 %   (Goal <5%)  Time Very Low (<54 mg/dL):   1 %   (Goal <8%)   Pt reports no change in DM medications (Lantus  @30u , Ozempic  @1  mg, Metformin  XR @1000  mg BID). Pt reports lower A1c, but states they are still not happy with their A1c. Pt reports having some difficulty controlling their glucose, still snacking in the evening occasionally. Pt reports staying up until 4-5 in the morning, not getting up until 10-11 am most days, sleeping poorly, reports getting less leakage from CPAP mask but still tired during the day. Pt reports usually eating 2 meals a day, and a turkey sandwich before bed. Pt reports they are still eating Lean Cuisine/Healthy Choice meals, trying to lower portions of home cooked meals, going out to eat once weekly with wife. Pt reports feeling more congested since last visit, using nebulizer a few times and has needed inhaler multiple times as well. Pt reports they try to be active doing chores at home but has to move slowly r/t DOE. Pt reports looking to purchase a house, states they look forward to not walking 3 flights of stairs multiple times a day.   Diabetes Self-Management Education - 12/02/23 1253       Visit Information   Visit Type Follow-up      Psychosocial Assessment   Self-care barriers Debilitated state due to current medical condition    Learning  Readiness Contemplating      Pre-Education Assessment   Patient understands the diabetes disease and treatment process. Comprehends key points    Patient understands incorporating nutritional management into lifestyle. Needs Review    Patient undertands incorporating physical activity into lifestyle. Comprehends key points    Patient understands using medications safely. Demonstrates understanding / competency    Patient understands monitoring blood glucose, interpreting and using results Comprehends key points    Patient understands prevention, detection, and treatment of acute complications. Comprehends key points    Patient understands prevention, detection, and treatment of chronic complications. Needs Review    Patient understands how to develop strategies to address psychosocial issues. Needs Review    Patient understands how to develop strategies to promote health/change behavior. Needs Review      Complications   Last HgB A1C per patient/outside source 8.4 %   09/20/2023   How often do you check your blood sugar? > 4 times/day    Fasting Blood glucose range (mg/dL) 869-820    Postprandial Blood glucose range (mg/dL) 819-799;>799      Dietary Intake   Breakfast Turkey sandwich w/ lite mayo/pepper, wheat bread    Dinner [employer] 3 piece chicken, fries, sugar-free koolaid    Snack (evening) Lightly salted chips, 3 reduced sugar oatmeal raisin  cookies    Beverage(s) Water, Sugar-free Koolaid      Activity / Exercise   Activity / Exercise Type ADL's      Patient Education   Disease Pathophysiology Explored patient's options for treatment of their diabetes    Healthy Eating Information on hints to eating out and maintain blood glucose control.;Reviewed blood glucose goals for pre and post meals and how to evaluate the patients' food intake on their blood glucose level.;Meal options for control of blood glucose level and chronic complications.    Medications Reviewed patients  medication for diabetes, action, purpose, timing of dose and side effects.    Monitoring Taught/evaluated CGM (comment)    Chronic complications Relationship between chronic complications and blood glucose control;Lipid levels, blood glucose control and heart disease    Diabetes Stress and Support Worked with patient to identify barriers to care and solutions      Individualized Goals (developed by patient)   Nutrition Follow meal plan discussed    Physical Activity Not Applicable    Medications take my medication as prescribed    Monitoring  Consistenly use CGM    Problem Solving Sleep Pattern;Eating Pattern    Reducing Risk examine blood glucose patterns      Patient Self-Evaluation of Goals - Patient rates self as meeting previously set goals (% of time)   Nutrition 25 - 50% (sometimes)    Physical Activity Not Applicable    Medications >75% (most of the time)    Monitoring >75% (most of the time)    Problem Solving and behavior change strategies  25 - 50% (sometimes)    Reducing Risk (treating acute and chronic complications) 25 - 50% (sometimes)    Health Coping 25 - 50% (sometimes)      Post-Education Assessment   Patient understands the diabetes disease and treatment process. Comprehends key points    Patient understands incorporating nutritional management into lifestyle. Needs Review    Patient undertands incorporating physical activity into lifestyle. N/A    Patient understands using medications safely. Demonstrates understanding / competency    Patient understands monitoring blood glucose, interpreting and using results Comprehends key points    Patient understands prevention, detection, and treatment of acute complications. Comprehends key points    Patient understands prevention, detection, and treatment of chronic complications. Comprehends key points    Patient understands how to develop strategies to address psychosocial issues. Comprehends key points    Patient  understands how to develop strategies to promote health/change behavior. Needs Review      Outcomes   Expected Outcomes Demonstrated interest in learning but significant barriers to change    Future DMSE 3-4 months    Program Status Not Completed      Subsequent Visit   Since your last visit have you continued or begun to take your medications as prescribed? Yes    Since your last visit have you had your blood pressure checked? Yes    Is your most recent blood pressure lower, unchanged, or higher since your last visit? Unchanged    Since your last visit have you experienced any weight changes? No change    Since your last visit, are you checking your blood glucose at least once a day? Yes   CGM         Individualized Plan for Diabetes Self-Management Training:   Learning Objective:  Patient will have a greater understanding of diabetes self-management. Patient education plan is to attend individual and/or group sessions per assessed needs  and concerns.   Plan:   Patient Instructions  Have your turkey sandwich in the middle of the day instead of before bed, and assess your hunger later in the evening.  Start your day with a balanced breakfast, higher in protein and/or fiber. Try options like [employer] with fruit, Kodiak breakfast bars, [employer] PROTEIN bars, [employer] PROTEIN oatmeal, [employer] plant based sausage, Turkey sausage/bacon.  Choose less fried options when eating away from home! This makes a huge difference every time you do it!  Expected Outcomes:  Demonstrated interest in learning but significant barriers to change  If problems or questions, patient to contact team via:  Phone and Email  Future DSME appointment: 3-4 months

## 2023-12-02 NOTE — Patient Instructions (Addendum)
 Have your turkey sandwich in the middle of the day instead of before bed, and assess your hunger later in the evening.  Start your day with a balanced breakfast, higher in protein and/or fiber. Try options like Greek [employer] Officer with fruit, Kodiak breakfast bars, [employer] PROTEIN bars, Quaker PROTEIN oatmeal, [employer] plant based sausage, Turkey sausage/bacon.  Choose less fried options when eating away from home! This makes a huge difference every time you do it!

## 2023-12-03 ENCOUNTER — Other Ambulatory Visit: Payer: Self-pay

## 2023-12-03 ENCOUNTER — Other Ambulatory Visit: Payer: Self-pay | Admitting: Internal Medicine

## 2023-12-03 ENCOUNTER — Other Ambulatory Visit: Payer: Self-pay | Admitting: Family

## 2023-12-03 DIAGNOSIS — E118 Type 2 diabetes mellitus with unspecified complications: Secondary | ICD-10-CM

## 2023-12-03 DIAGNOSIS — I5022 Chronic systolic (congestive) heart failure: Secondary | ICD-10-CM

## 2023-12-03 MED ORDER — METFORMIN HCL ER 500 MG PO TB24
1000.0000 mg | ORAL_TABLET | Freq: Two times a day (BID) | ORAL | 2 refills | Status: AC
  Filled 2023-12-03: qty 120, 30d supply, fill #0
  Filled 2024-02-11: qty 120, 30d supply, fill #1

## 2023-12-03 MED FILL — Hydralazine HCl Tab 25 MG: ORAL | 30 days supply | Qty: 60 | Fill #1 | Status: AC

## 2023-12-04 ENCOUNTER — Other Ambulatory Visit: Payer: Self-pay

## 2023-12-04 MED ORDER — NEBIVOLOL HCL 5 MG PO TABS
5.0000 mg | ORAL_TABLET | Freq: Every day | ORAL | 0 refills | Status: DC
  Filled 2023-12-04: qty 30, 30d supply, fill #0

## 2023-12-04 MED ORDER — TORSEMIDE 20 MG PO TABS
40.0000 mg | ORAL_TABLET | Freq: Every day | ORAL | 0 refills | Status: DC
  Filled 2023-12-04: qty 60, 30d supply, fill #0

## 2023-12-05 ENCOUNTER — Ambulatory Visit

## 2023-12-05 VITALS — BP 99/67 | HR 104 | Ht 68.0 in | Wt 275.0 lb

## 2023-12-05 DIAGNOSIS — Z794 Long term (current) use of insulin: Secondary | ICD-10-CM

## 2023-12-05 DIAGNOSIS — N1831 Chronic kidney disease, stage 3a: Secondary | ICD-10-CM | POA: Diagnosis not present

## 2023-12-05 DIAGNOSIS — E1122 Type 2 diabetes mellitus with diabetic chronic kidney disease: Secondary | ICD-10-CM | POA: Diagnosis not present

## 2023-12-05 DIAGNOSIS — J449 Chronic obstructive pulmonary disease, unspecified: Secondary | ICD-10-CM | POA: Diagnosis not present

## 2023-12-05 DIAGNOSIS — E782 Mixed hyperlipidemia: Secondary | ICD-10-CM

## 2023-12-05 DIAGNOSIS — E0822 Diabetes mellitus due to underlying condition with diabetic chronic kidney disease: Secondary | ICD-10-CM

## 2023-12-05 DIAGNOSIS — E559 Vitamin D deficiency, unspecified: Secondary | ICD-10-CM

## 2023-12-05 NOTE — Progress Notes (Unsigned)
 Established Patient Office Visit  Subjective   Patient ID: Bradley Franchini., male    DOB: [birth year]  Age: 58 y.o. MRN: [id number]  Chief Complaint  Patient presents with   Medical Management of Chronic Issues    6 month follow up    HPI Discussed the use of AI scribe software for clinical note transcription with the patient, who gave verbal consent to proceed.  History of Present Illness    Bradley Nolton Denis. is a 58 year old male with diabetes who presents for a six-month follow-up and diabetic foot exam.  Diabetes mellitus management - Diabetes managed with Lantus , Ozempic , and metformin . - Recent A1c checked; result not specified. - Uncertain if feet are examined during endocrinology visits. - Typically receives foot checks in [place], possibly by a podiatrist, but no upcoming appointment scheduled.  Nutritional counseling - Recently visited a nutritionist. - Received a message indicating a new referral is needed for nutrition services.  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  Musculoskeletal pain - Soreness in arm, leg, and knee. - Considering moving up neurology appointment with Dr. Venetia Potters due to these symptoms. - Last neurology visit was August 20, 2023; next scheduled for August 19, 2024.  Respiratory symptoms - Wheezing present, attributed to weather and leaves. - Wheezing improved with recent rainfall.     Patient Active Problem List   Diagnosis Date Noted   SOB (shortness of breath) 11/12/2023   Chronic rhinitis 11/12/2023   Seasonal allergic rhinitis due to pollen 06/05/2023   Chronic obstructive pulmonary disease, unspecified COPD type (HCC) 06/05/2023   GERD (gastroesophageal reflux disease) 12/02/2022   Colon cancer screening 12/02/2022   Mass of right axilla 12/02/2022   Asthmatic bronchitis 05/10/2022   Insomnia 11/28/2021   Wheezing 09/06/2021   Dizziness 05/17/2021   Not  well controlled asthma without complication 05/17/2021   Feeling of chest tightness 02/21/2021   Health care maintenance 11/22/2020   Vitamin D  deficiency 06/15/2020   Elevated serum creatinine 06/15/2020   Left arm pain 06/15/2020   Stomach upset 09/19/2016   Somatic dysfunction of spine, lumbar 08/28/2016   Segmental dysfunction of cervical region 08/28/2016   Muscle spasm of back 08/28/2016   Gynecomastia 12/22/2015   Dental caries 12/12/2015   Medication monitoring encounter 12/12/2015   Low back pain 12/12/2015   CKD stage 3a, GFR 45-59 ml/min (HCC)    OSA on CPAP    Hypertensive cardiomyopathy (HCC) 06/30/2015   Mixed hyperlipidemia    Morbid obesity (HCC)    Diabetes mellitus due to underlying condition with stage 3a chronic kidney disease, with long-term current use of insulin  (HCC)    Sinusitis 11/11/2014   Tachycardia 09/12/2014   Chronic systolic heart failure (HCC) 06/16/2014   Obstructive sleep apnea 06/16/2014   Essential hypertension 06/16/2014   H/O respiratory system disease 02/24/2014    ROS    Objective:     BP 99/67   Pulse (!) 104   Ht 5' 8 (1.727 m)   Wt 275 lb (124.7 kg)   SpO2 90%   BMI 41.81 kg/m  BP Readings from Last 3 Encounters:  12/05/23 99/67  11/12/23 130/70  09/03/23 (!) 142/86   Wt Readings from Last 3 Encounters:  12/05/23 275 lb (124.7 kg)  11/12/23 282 lb (127.9 kg)  09/03/23 285 lb 6 oz (129.4 kg)      Physical Exam Vitals and nursing note reviewed.  Constitutional:  Appearance: Normal appearance.  HENT:     Head: Normocephalic.     Right Ear: Tympanic membrane, ear canal and external ear normal.     Left Ear: Tympanic membrane, ear canal and external ear normal.     Nose: Nose normal.     Mouth/Throat:     Mouth: Mucous membranes are moist.     Pharynx: Oropharynx is clear.  Eyes:     Extraocular Movements: Extraocular movements intact.     Pupils: Pupils are equal, round, and reactive to light.   Cardiovascular:     Rate and Rhythm: Normal rate and regular rhythm.  Pulmonary:     Effort: Pulmonary effort is normal.     Breath sounds: Normal breath sounds.  Musculoskeletal:     Cervical back: Normal range of motion and neck supple.  Skin:    General: Skin is warm and dry.  Neurological:     Mental Status: He is alert and oriented to person, place, and time.  Psychiatric:        Mood and Affect: Mood normal.        Thought Content: Thought content normal.    Diabetic foot exam was performed with the following findings:   No deformities, ulcerations, or other skin breakdown Normal sensation of 10g monofilament Intact posterior tibialis and dorsalis pedis pulses     Results for orders placed or performed in visit on 12/05/23  Hemoglobin A1c  Result Value Ref Range   Hemoglobin A1C 8.4     Last CBC Lab Results  Component Value Date   WBC 9.5 04/21/2023   HGB 14.2 04/21/2023   HCT 43.7 04/21/2023   MCV 84 04/21/2023   MCH 27.3 04/21/2023   RDW 13.7 04/21/2023   PLT 279 04/21/2023   Last metabolic panel Lab Results  Component Value Date   GLUCOSE 151 (H) 06/03/2023   NA 142 06/03/2023   K 4.1 06/03/2023   CL 101 06/03/2023   CO2 19 (L) 06/03/2023   BUN 13 06/03/2023   CREATININE 1.66 (H) 06/03/2023   EGFR 48 (L) 06/03/2023   CALCIUM  9.6 06/03/2023   PHOS 6.0 (H) 09/02/2015   PROT 7.5 06/03/2023   ALBUMIN 4.4 06/03/2023   LABGLOB 3.1 06/03/2023   AGRATIO 1.4 04/18/2022   BILITOT 0.3 06/03/2023   ALKPHOS 113 06/03/2023   AST 22 06/03/2023   ALT 27 06/03/2023   ANIONGAP 11 12/31/2022   Last lipids Lab Results  Component Value Date   CHOL 142 06/03/2023   HDL 40 06/03/2023   LDLCALC 83 06/03/2023   TRIG 101 06/03/2023   CHOLHDL 3.6 06/03/2023   Last hemoglobin A1c Lab Results  Component Value Date   HGBA1C 8.4 09/16/2023   Last thyroid functions Lab Results  Component Value Date   TSH 1.140 11/22/2020   FREET4 1.05 09/30/2019   Last  vitamin D  Lab Results  Component Value Date   VD25OH 16.6 (L) 06/03/2023   Last vitamin B12 and Folate Lab Results  Component Value Date   VITAMINB12 634 06/03/2023      The 10-year ASCVD risk score (Arnett DK, et al., 2019) is: 12.8%    Assessment & Plan:   Problem List Items Addressed This Visit       Respiratory   Chronic obstructive pulmonary disease, unspecified COPD type (HCC)   Wheezing reported, exacerbated by environmental factors. No acute exacerbation noted.        Endocrine   Diabetes mellitus due to underlying condition with  stage 3a chronic kidney disease, with long-term current use of insulin  (HCC) - Primary   Diabetes managed with Lantus , Ozempic , and metformin . A1c monitored by endocrinology. Nutritionist referral pending. - Continue Lantus , Ozempic , and metformin . - Follow-up with endocrinology for A1c monitoring. - Scheduled podiatrist appointment for foot exam. - Submitted nutritionist referral.      Relevant Orders   [facility] Diabetes Foot Exam (Completed)   Referral to Nutrition and Diabetes Services     Other   Mixed hyperlipidemia (Chronic)   Lipid panel updated in August.  Total cholesterol 180 and LDL 106.  Atorvastatin  was increased to 20 mg daily in light of this result. -Repeat lipid panel at follow-up in 3 months       Return in about 6 months (around 06/03/2024) for chronic follow-up with PCP.    Leita Longs, FNP

## 2023-12-08 ENCOUNTER — Other Ambulatory Visit: Payer: Self-pay

## 2023-12-08 NOTE — Assessment & Plan Note (Signed)
 Wheezing reported, exacerbated by environmental factors. No acute exacerbation noted.

## 2023-12-08 NOTE — Assessment & Plan Note (Signed)
 Diabetes managed with Lantus , Ozempic , and metformin . A1c monitored by endocrinology. Nutritionist referral pending. - Continue Lantus , Ozempic , and metformin . - Follow-up with endocrinology for A1c monitoring. - Scheduled podiatrist appointment for foot exam. - Submitted nutritionist referral.

## 2023-12-08 NOTE — Assessment & Plan Note (Signed)
 Lipid panel updated in August.  Total cholesterol 180 and LDL 106.  Atorvastatin was increased to 20 mg daily in light of this result. -Repeat lipid panel at follow-up in 3 months

## 2023-12-17 ENCOUNTER — Telehealth: Payer: Self-pay

## 2023-12-17 NOTE — Telephone Encounter (Signed)
 Patient needs to come in for nurse visit for A1C Point of care testing. I have left a message with the patient to call and schedule. He has been flagged for  failing the [employer] for Diabetes.?

## 2023-12-18 ENCOUNTER — Other Ambulatory Visit: Payer: Self-pay

## 2023-12-18 MED ORDER — OZEMPIC (2 MG/DOSE) 8 MG/3ML ~~LOC~~ SOPN
2.0000 mg | PEN_INJECTOR | SUBCUTANEOUS | 3 refills | Status: AC
  Filled 2023-12-18: qty 9, 84d supply, fill #0
  Filled 2024-01-30 (×2): qty 9, 84d supply, fill #1

## 2023-12-19 ENCOUNTER — Other Ambulatory Visit: Payer: Self-pay

## 2023-12-19 DIAGNOSIS — E559 Vitamin D deficiency, unspecified: Secondary | ICD-10-CM

## 2023-12-20 ENCOUNTER — Other Ambulatory Visit: Payer: Self-pay | Admitting: Family

## 2023-12-20 ENCOUNTER — Other Ambulatory Visit: Payer: Self-pay

## 2023-12-22 ENCOUNTER — Other Ambulatory Visit: Payer: Self-pay

## 2023-12-22 ENCOUNTER — Telehealth: Payer: Self-pay

## 2023-12-22 DIAGNOSIS — E559 Vitamin D deficiency, unspecified: Secondary | ICD-10-CM

## 2023-12-22 MED ORDER — TORSEMIDE 20 MG PO TABS
40.0000 mg | ORAL_TABLET | Freq: Every day | ORAL | 0 refills | Status: AC
  Filled 2023-12-22 (×2): qty 60, 30d supply, fill #0

## 2023-12-22 MED FILL — Ergocalciferol Cap 1.25 MG (50000 Unit): ORAL | 35 days supply | Qty: 5 | Fill #0 | Status: CN

## 2023-12-22 NOTE — Telephone Encounter (Signed)
 Patient was identified as falling into the [employer] - Diabetes.   Patient was: Attribution and/or data issue.  Validation/Investigation needed.  Explanation:  Patient is not with [facility]. Patient is a patient with [facility]. PCP is Leita Longs

## 2023-12-24 ENCOUNTER — Other Ambulatory Visit: Payer: Self-pay

## 2023-12-31 ENCOUNTER — Other Ambulatory Visit: Payer: Self-pay

## 2023-12-31 MED ORDER — TORSEMIDE 20 MG PO TABS
40.0000 mg | ORAL_TABLET | Freq: Every day | ORAL | 0 refills | Status: DC
  Filled 2023-12-31: qty 60, 30d supply, fill #0

## 2024-01-01 ENCOUNTER — Other Ambulatory Visit: Payer: Self-pay

## 2024-01-02 ENCOUNTER — Other Ambulatory Visit: Payer: Self-pay | Admitting: Family

## 2024-01-02 ENCOUNTER — Other Ambulatory Visit: Payer: Self-pay

## 2024-01-02 DIAGNOSIS — I5022 Chronic systolic (congestive) heart failure: Secondary | ICD-10-CM

## 2024-01-02 MED FILL — Ergocalciferol Cap 1.25 MG (50000 Unit): ORAL | 35 days supply | Qty: 5 | Fill #0 | Status: AC

## 2024-01-02 MED FILL — Hydralazine HCl Tab 25 MG: ORAL | 30 days supply | Qty: 60 | Fill #2 | Status: AC

## 2024-01-05 ENCOUNTER — Other Ambulatory Visit: Payer: Self-pay

## 2024-01-05 MED ORDER — NEBIVOLOL HCL 5 MG PO TABS
5.0000 mg | ORAL_TABLET | Freq: Every day | ORAL | 0 refills | Status: DC
  Filled 2024-01-05: qty 30, 30d supply, fill #0

## 2024-01-15 NOTE — Progress Notes (Unsigned)
" ° °  [address] [place] [address] Dept: [phone]  FOLLOW UP NOTE  Patient ID: Denece Waddell Raddle., male    DOB: 1965/04/10  Age: 59 y.o. MRN: [id number] Date of Office Visit: 01/16/2024  Assessment  Chief Complaint: No chief complaint on file.  HPI [place] Demar Shad. is a 59 year old male who presents to the clinic for follow-up visit.  He was last seen in this clinic on 11/12/2023 by Arlean Mutter, FNP, for evaluation of asthma, nonallergic rhinitis, and reflux.  At that time, a chest x-ray was ordered for shortness of breath, however, he did not get the x-ray that was ordered.  Discussed the use of AI scribe software for clinical note transcription with the patient, who gave verbal consent to proceed.  History of Present Illness      Drug Allergies:  Allergies[1]  Physical Exam: There were no vitals taken for this visit.   Physical Exam  Diagnostics:    Assessment and Plan: No diagnosis found.  No orders of the defined types were placed in this encounter.   There are no Patient Instructions on file for this visit.  No follow-ups on file.    Thank you for the opportunity to care for this patient.  Please do not hesitate to contact me with questions.  Arlean Mutter, FNP [facility]          [1]  Allergies Allergen Reactions   Spironolactone  Other (See Comments)    gynecomastia   "

## 2024-01-15 NOTE — Patient Instructions (Incomplete)
 Asthma Continue Symbicort  160-2 puffs twice a day with a spacer to prevent cough or wheeze Continue albuterol  2 puffs once every 4 hours if needed for cough or wheeze You may use albuterol  2 puffs 5-15 minutes before activity to decrease cough or wheeze   Nonallergic rhinitis Continue montelukast  10 mg once a day for control of symptoms of rhinitis Continue cetirizine  10 mg once a day if needed for runny nose or itch.  You may take an additional dose of cetirizine  10 mg once a day for breakthrough symptoms  Reflux/vomit Continue dietary and lifestyle modifications as listed below Continue omeprazole  40 mg daily for control of reflux.  Take this medication 30 to 60 minutes before your first meal for best results Continue famotidine  40 mg once a day for control of reflux. Consider referral to GI for uncontrolled reflux in the setting of diabetes  Call the clinic if this treatment plan is not working well for you.  Follow up in 2 months or sooner if needed.

## 2024-01-16 ENCOUNTER — Encounter: Payer: Self-pay | Admitting: Family Medicine

## 2024-01-16 ENCOUNTER — Ambulatory Visit: Admitting: Family Medicine

## 2024-01-16 ENCOUNTER — Other Ambulatory Visit: Payer: Self-pay

## 2024-01-16 VITALS — BP 110/70 | HR 100 | Temp 98.1°F | Resp 18 | Ht 69.0 in | Wt 270.0 lb

## 2024-01-16 DIAGNOSIS — J310 Chronic rhinitis: Secondary | ICD-10-CM

## 2024-01-16 DIAGNOSIS — J45909 Unspecified asthma, uncomplicated: Secondary | ICD-10-CM

## 2024-01-16 DIAGNOSIS — K219 Gastro-esophageal reflux disease without esophagitis: Secondary | ICD-10-CM

## 2024-01-16 DIAGNOSIS — J45998 Other asthma: Secondary | ICD-10-CM | POA: Diagnosis not present

## 2024-01-16 MED ORDER — AIRSUPRA 90-80 MCG/ACT IN AERO
2.0000 | INHALATION_SPRAY | RESPIRATORY_TRACT | 1 refills | Status: AC | PRN
  Filled 2024-01-16: qty 10.7, 30d supply, fill #0

## 2024-01-16 MED ORDER — AIRSUPRA 90-80 MCG/ACT IN AERO
2.0000 | INHALATION_SPRAY | RESPIRATORY_TRACT | 1 refills | Status: DC | PRN
  Filled 2024-01-16: qty 10.7, fill #0

## 2024-01-23 ENCOUNTER — Telehealth: Payer: Self-pay | Admitting: Family

## 2024-01-23 NOTE — Telephone Encounter (Signed)
 Called to confirm/remind patient of their appointment [facility] on 01/26[facility].   Appointment:   [x] Confirmed  [] Left mess   [] No answer/No voice mail  [] VM Full/unable to leave message  [] Phone not in service  Patient reminded to bring all medications and/or complete list.  Confirmed patient has transportation. Gave directions, instructed to utilize valet parking.

## 2024-01-24 NOTE — Progress Notes (Unsigned)
 "  Advanced [facility] Clinic Note    ERE:Ylzwpwx, Leita, FNP HF Cardiologist:   Chief Complaint: fatigue   HPI: Mr.Baez is a 59 y/o male who has a history of DMII, HTN, lumbar spondylosis, obstructive sleep apnea, depression, hyperlipidemia, asthma, CKD, morbid obesity and chronic systolic heart failure.   Remote tobacco exposure.   Was in the ED 12/09/21 due to left foot numbness. Was in the ED 12/18/21 due to LBP radiating down left leg.  He presents today for a HF follow-up visit with a chief complaint of minimal fatigue. Has associated occasional palpitations, dizziness with sudden position changes. Denies shortness of breath, chest pain, pedal edema or difficulty sleeping. Has lost weight since last visit here and is tolerating ozempic  without known side effects. Some days, he says that he just doesn't want to eat anything.   Disabled.   Previous cardiac studies:  Echo 03/27/15: EF 40-45% with mild MR Echo 09/02/15: EF of 30-35%, mild mitral regurgitation and no aortic stenosis. Echo 10/28/2018: EF of 60-65% along with mildly elevated PA pressure.  Echo 09/23/22: EF 55-60% with moderate LVH, Grade I DD  ROS: All systems negative except as listed in HPI, PMH and Problem List.  SH:  Social History   Socioeconomic History   Marital status: Married    Spouse name: Not on file   Number of children: 2   Years of education: 12   Highest education level: GED or equivalent  Occupational History   Occupation: unemployed    Comment: hard time getting disability, denied medicaid   Tobacco Use   Smoking status: Never   Smokeless tobacco: Never   Tobacco comments:    Quit in his 30's.  Vaping Use   Vaping status: Never Used  Substance and Sexual Activity   Alcohol use: Not Currently    Comment: rarely alcohol use at special events   Drug use: Not Currently    Comment: Used marijuana in his teens.   Sexual activity: Not Currently  Other Topics Concern   Not on file   Social History Narrative   Lives in [place] with his wife. Wife no longer working; now on disability. Have stable housing at group home that his wife used to work at. Sometimes struggles with transportation but not desperate. Wife has working car, his doesn't work. Had food stamps but cut off after 3 months. Wasn't eligible after that bc not able to work. Has been denied twice from disability. He was not able to obtain medicaid   Social Drivers of Health   Tobacco Use: Low Risk (01/16/2024)   Patient History    Smoking Tobacco Use: Never    Smokeless Tobacco Use: Never    Passive Exposure: Not on file  Financial Resource Strain: Low Risk (11/28/2021)   Overall Financial Resource Strain (CARDIA)    Difficulty of Paying Living Expenses: Not hard at all  Food Insecurity: No Food Insecurity (05/17/2021)   Hunger Vital Sign    Worried About Running Out of Food in the Last Year: Never true    Ran Out of Food in the Last Year: Never true  Transportation Needs: No Transportation Needs (05/17/2021)   PRAPARE - Administrator, Civil Service (Medical): No    Lack of Transportation (Non-Medical): No  Physical Activity: Inactive (11/28/2021)   Exercise Vital Sign    Days of Exercise per Week: 0 days    Minutes of Exercise per Session: 0 min  Stress: No Stress Concern Present (11/28/2021)  [employer] of Occupational Health - Occupational Stress Questionnaire    Feeling of Stress : Only a little  Social Connections: Moderately Integrated (11/28/2021)   Social Connection and Isolation Panel    Frequency of Communication with Friends and Family: More than three times a week    Frequency of Social Gatherings with Friends and Family: Once a week    Attends Religious Services: 1 to 4 times per year    Active Member of [employer] or Organizations: No    Attends Banker Meetings: Never    Marital Status: Married  Catering Manager Violence: Not At Risk (11/28/2021)    Humiliation, Afraid, Rape, and Kick questionnaire    Fear of Current or Ex-Partner: No    Emotionally Abused: No    Physically Abused: No    Sexually Abused: No  Depression (PHQ2-9): Low Risk (06/03/2023)   Depression (PHQ2-9)    PHQ-2 Score: 4  Recent Concern: Depression (PHQ2-9) - Medium Risk (03/07/2023)   Depression (PHQ2-9)    PHQ-2 Score: 10  Alcohol Screen: Low Risk (11/28/2021)   Alcohol Screen    Last Alcohol Screening Score (AUDIT): 0  Housing: Unknown (04/30/2023)   Received from [facility]   Epic    Unable to Pay for Housing in the Last Year: Not on file    Number of Times Moved in the Last Year: Not on file    At any time in the past 12 months, were you homeless or living in a shelter (including now)?: No  Utilities: Not At Risk (11/28/2021)   [facility] Utilities    Threatened with loss of utilities: No  Health Literacy: Not on file    FH:  Family History  Problem Relation Age of Onset   Heart disease Mother        alive & well.   Heart attack Father 64       died @ 63 of cancer.   Diabetes Father    Cancer Father        in his stomach   Cancer Sister    Cancer Paternal Uncle        strong FH malignancy multiple relavies     Past Medical History:  Diagnosis Date   Allergy    Asthma    Cardiomyopathy (HCC) 2016   a. 01/2014 Echo: EF 25-30%;  b. ? ischemic vs non-ischemic.  He's never had an ischemic eval.   Chronic combined systolic (congestive) and diastolic (congestive) heart failure (HCC)    a. 01/2014 Echo: EF 25-30%. b. Echo 03/2015: Improved EF of 40-45%   COPD (chronic obstructive pulmonary disease) (HCC)    Depression    Diabetes mellitus without complication (HCC) 2018   started med therapy approx 2018   Hyperlipidemia    Hypertension    Hypertensive cardiomyopathy (HCC) 06/30/2015   Hypertensive heart disease    a. Since his 98's.   Morbid obesity (HCC)    Obstructive sleep apnea    a. Has not used CPAP since ~ 2012.   Sleep  apnea     Current Outpatient Medications  Medication Sig Dispense Refill   Accu-Chek Softclix Lancets lancets Use to check blood sugar three times daily 100 each 1   albuterol  (PROAIR  HFA) 108 (90 Base) MCG/ACT inhaler Inhale 2 puffs into the lungs every 6 (six) hours as needed for wheezing or shortness of breath. 6.7 g 4   Albuterol -Budesonide  (AIRSUPRA ) 90-80 MCG/ACT AERO Inhale 2 puffs into the lungs as needed (  cough or wheeze). 10.7 g 1   atorvastatin  (LIPITOR) 20 MG tablet Take 1 tablet (20 mg total) by mouth daily. 90 tablet 3   Blood Glucose Monitoring Suppl (ACCU-CHEK GUIDE) w/Device KIT 1 each by Does not apply route 3 (three) times daily. 1 kit 12   Blood Glucose Monitoring Suppl DEVI 1 each by Does not apply route in the morning, at noon, and at bedtime. May substitute to any manufacturer covered by patient's insurance. 1 each 0   Blood Pressure Monitoring (BLOOD PRESSURE CUFF) MISC USE AS DIRECTED ONCE DAILY AT 12 NOON. 1 each 0   budesonide  (PULMICORT ) 0.5 MG/2ML nebulizer solution Take 2 mLs (0.5 mg total) by nebulization 2 (two) times daily as needed (shortness of breath or wheezing). 120 mL 3   budesonide -formoterol  (SYMBICORT ) 160-4.5 MCG/ACT inhaler Inhale 2 puffs into the lungs 2 (two) times daily. 10.2 g 11   cetirizine  (ZYRTEC ) 10 MG tablet Take 1 tablet (10 mg total) by mouth daily. 90 tablet 1   Continuous Glucose Receiver (DEXCOM G7 RECEIVER) DEVI 1 each by Does not apply route continuous. 1 each 0   Continuous Glucose Sensor (DEXCOM G7 SENSOR) MISC 1 each by Does not apply route every 14 (fourteen) days. 1 each 3   famotidine  (PEPCID ) 40 MG tablet Take 1 tablet (40 mg total) by mouth daily. 30 tablet 5   glucose blood (ACCU-CHEK GUIDE) test strip Use as instructed 100 each 12   hydrALAZINE  (APRESOLINE ) 25 MG tablet Take 1 tablet (25 mg total) by mouth 2 (two) times daily. 60 tablet 3   insulin  glargine (LANTUS  SOLOSTAR) 100 UNIT/ML Solostar Pen Inject 31 Units into the  skin daily. 30 mL 1   Insulin  Pen Needle (BD AUTOSHIELD DUO) 30G X 5 MM MISC Use as directed daily. 100 each 6   Insulin  Pen Needle 32G X 4 MM MISC Use as directed 100 each 11   losartan  (COZAAR ) 100 MG tablet Take 1 tablet (100 mg total) by mouth daily. 90 tablet 4   metFORMIN  (GLUCOPHAGE -XR) 500 MG 24 hr tablet Take 2 tablets (1,000 mg total) by mouth 2 (two) times daily with a meal. 120 tablet 2   montelukast  (SINGULAIR ) 10 MG tablet Take 1 tablet (10 mg total) by mouth at bedtime. 90 tablet 3   nebivolol  (BYSTOLIC ) 5 MG tablet Take 1 tablet (5 mg total) by mouth daily. 30 tablet 0   omeprazole  (PRILOSEC) 40 MG capsule Take 1 capsule (40 mg total) by mouth daily. 30 capsule 5   potassium chloride  SA (KLOR-CON  M) 20 MEQ tablet Take 1 tablet (20 mEq total) by mouth 2 (two) times daily. 180 tablet 4   Rightest GL300 Lancets MISC USE AS DIRECTED. 100 each 0   Semaglutide , 2 MG/DOSE, (OZEMPIC , 2 MG/DOSE,) 8 MG/3ML SOPN Inject 2 mg into the skin every 7 (seven) days. 9 mL 3   Semaglutide ,0.25 or 0.5MG /DOS, (OZEMPIC , 0.25 OR 0.5 MG/DOSE,) 2 MG/3ML SOPN Inject 0.25 mg into the skin once a week. 3 mL 0   torsemide  (DEMADEX ) 20 MG tablet Take 2 tablets (40 mg total) by mouth daily. May take an extra 20mg  tablet daily as needed for worsening shortness of breath, swelling, or weight gain.Call hf clinic for refills [phone] 60 tablet 0   Vitamin D , Ergocalciferol , (DRISDOL ) 1.25 MG (50000 UNIT) CAPS capsule Take 1 capsule (50,000 Units total) by mouth every 7 (seven) days. 5 capsule 5   No current facility-administered medications for this visit.   Vitals:  01/26/24 1146  BP: 128/73  Pulse: 95  SpO2: 98%  Weight: 271 lb (122.9 kg)   Wt Readings from Last 3 Encounters:  01/26/24 271 lb (122.9 kg)  01/16/24 270 lb (122.5 kg)  12/05/23 275 lb (124.7 kg)   Lab Results  Component Value Date   CREATININE 1.66 (H) 06/03/2023   CREATININE 1.51 (H) 04/21/2023   CREATININE 1.83 (H) 12/31/2022     PHYSICAL EXAM: General: Well appearing.  Cor: No JVD. Regular rhythm, rate.  Lungs: clear Abdomen: soft, nontender, nondistended. Extremities: no edema Neuro:. Affect pleasant   ASSESSMENT & PLAN:  1: Chronic HFpEF - likely due to HTN and OSA - NYHA III Multifactorial with lung disease.  Volume status stable.  - weight down 14 pounds from last visit here 10 months ago - Echo 03/27/15: EF 40-45% with mild MR - Echo 09/02/15: EF of 30-35%, mild mitral regurgitation and no aortic stenosis. - Echo 10/28/2018: EF of 60-65% along with mildly elevated PA pressure.  - Echo 09/23/22: EF 55-60% with moderate LVH, Grade I DD - will get echo updated - farxiga  stopped 06/25 as a potential cause of neuropathy - continue losartan  100mg  daily - continue bystolic  5mg  daily  - Continue torsemide  40 mg daily with extra 20 mg as needed. / potassium 20meq BID - developed gynecomastia with spironolactone  - Could consider Cardiomems to help with volume management in the future, if needed - Discussed low salt food choices.    2: HTN - BP controlled 128/73 - seen by PCP 11/25 - Continue hydralazine  75 mg twice a day.  - BMET 09/18/23 reviewed: sodium 139, potassium 4.1, creatinine 1.6, GFR 50 - BMET today  3: Diabetes- - A1c 12/18/23 was 8.2%  - continue metformin  500mg  BID - continue ozempic  2mg  weekly - seen by endocrinologist 12/25  4: OSA  - Using CPAP every night.   5: Obesity- - BMI 40.02 kg/ m2 - weight down 14 pounds/ Congratulated on this   Return in 6 months, sooner if needed.   I spent 36 minutes reviewing records, interviewing/ examing patient and managing plan/ orders.   Ellouise DELENA Class, FNP 01/24/2024  "

## 2024-01-26 ENCOUNTER — Ambulatory Visit: Admitting: Family

## 2024-01-26 ENCOUNTER — Encounter: Payer: Self-pay | Admitting: Family

## 2024-01-26 ENCOUNTER — Ambulatory Visit: Payer: Self-pay | Admitting: Family

## 2024-01-26 ENCOUNTER — Other Ambulatory Visit: Payer: Self-pay

## 2024-01-26 ENCOUNTER — Other Ambulatory Visit
Admission: RE | Admit: 2024-01-26 | Discharge: 2024-01-26 | Disposition: A | Discharge status: Home / Self Care | Source: Ambulatory Visit | Attending: Family | Admitting: Family

## 2024-01-26 VITALS — BP 128/73 | HR 95 | Wt 271.0 lb

## 2024-01-26 DIAGNOSIS — G4733 Obstructive sleep apnea (adult) (pediatric): Secondary | ICD-10-CM | POA: Diagnosis not present

## 2024-01-26 DIAGNOSIS — I5022 Chronic systolic (congestive) heart failure: Secondary | ICD-10-CM | POA: Insufficient documentation

## 2024-01-26 DIAGNOSIS — E66813 Obesity, class 3: Secondary | ICD-10-CM

## 2024-01-26 DIAGNOSIS — Z794 Long term (current) use of insulin: Secondary | ICD-10-CM | POA: Diagnosis not present

## 2024-01-26 DIAGNOSIS — E119 Type 2 diabetes mellitus without complications: Secondary | ICD-10-CM

## 2024-01-26 DIAGNOSIS — I5032 Chronic diastolic (congestive) heart failure: Secondary | ICD-10-CM

## 2024-01-26 DIAGNOSIS — Z6841 Body Mass Index (BMI) 40.0 and over, adult: Secondary | ICD-10-CM

## 2024-01-26 DIAGNOSIS — I10 Essential (primary) hypertension: Secondary | ICD-10-CM

## 2024-01-26 LAB — BASIC METABOLIC PANEL WITH GFR
Anion gap: 11 (ref 5–15)
BUN: 14 mg/dL (ref 6–20)
CO2: 27 mmol/L (ref 22–32)
Calcium: 9.3 mg/dL (ref 8.9–10.3)
Chloride: 102 mmol/L (ref 98–111)
Creatinine, Ser: 1.57 mg/dL — ABNORMAL HIGH (ref 0.61–1.24)
GFR, Estimated: 51 mL/min — ABNORMAL LOW
Glucose, Bld: 183 mg/dL — ABNORMAL HIGH (ref 70–99)
Potassium: 3.9 mmol/L (ref 3.5–5.1)
Sodium: 140 mmol/L (ref 135–145)

## 2024-01-26 MED ORDER — TORSEMIDE 20 MG PO TABS
40.0000 mg | ORAL_TABLET | Freq: Every day | ORAL | 5 refills | Status: AC
  Filled 2024-01-26: qty 60, 20d supply, fill #0

## 2024-01-26 NOTE — Patient Instructions (Signed)
 Medication Changes:  No medication changes today!  Lab Work:  Go over to the [facility]. Go pass the gift shop and have your blood work completed.   We will only call you if the results are abnormal or if the provider would like to make medication changes.  No news is good news.     Testing/Procedures:  Your physician has requested that you have an echocardiogram. Echocardiography is a painless test that uses sound waves to create images of your heart. It provides your doctor with information about the size and shape of your heart and how well your hearts chambers and valves are working. This procedure takes approximately one hour. There are no restrictions for this procedure. Please do NOT wear cologne, perfume, aftershave, or lotions (deodorant is allowed). Please arrive 15 minutes prior to your appointment time.  Please note: We ask at that you not bring children with you during ultrasound (echo/ vascular) testing. Due to room size and safety concerns, children are not allowed in the ultrasound rooms during exams. Our front office staff cannot provide observation of children in our lobby area while testing is being conducted. An adult accompanying a patient to their appointment will only be allowed in the ultrasound room at the discretion of the ultrasound technician under special circumstances. We apologize for any inconvenience.  SOMEONE WILL BE IN CONTACT WITH YOU IN ORDER TO SCHEDULE YOUR APPOINTMENT.     Follow-Up in: Please follow up with the Advanced [facility] Clinic in 6 months with Ellouise Class, FNP.   Thank you for choosing [facility].    At the Advanced [facility] Clinic, you and your health needs are our priority. We have a designated team specialized in the treatment of Heart Failure. This Care Team includes your primary Heart Failure Specialized Cardiologist (physician), Advanced Practice Providers (APPs- Physician  Assistants and Nurse Practitioners), and Pharmacist who all work together to provide you with the care you need, when you need it.   You may see any of the following providers on your designated Care Team at your next follow up:  Dr. Toribio Fuel Dr. Ezra Shuck Dr. Ria Commander Dr. Morene Brownie Ellouise Class, FNP Jaun Bash, RPH-CPP  Please be sure to bring in all your medications bottles to every appointment.   Need to Contact Us :  If you have any questions or concerns before your next appointment please send us  a message through [place] or call our office at [phone].    TO LEAVE A MESSAGE FOR THE NURSE SELECT OPTION 2, PLEASE LEAVE A MESSAGE INCLUDING: YOUR NAME DATE OF BIRTH CALL BACK NUMBER REASON FOR CALL**this is important as we prioritize the call backs  YOU WILL RECEIVE A CALL BACK THE SAME DAY AS LONG AS YOU CALL BEFORE 4:00 PM

## 2024-01-27 ENCOUNTER — Other Ambulatory Visit: Payer: Self-pay

## 2024-01-30 ENCOUNTER — Other Ambulatory Visit: Payer: Self-pay

## 2024-01-30 MED FILL — Ergocalciferol Cap 1.25 MG (50000 Unit): ORAL | 35 days supply | Qty: 5 | Fill #1 | Status: AC

## 2024-02-11 ENCOUNTER — Other Ambulatory Visit: Payer: Self-pay

## 2024-02-11 ENCOUNTER — Other Ambulatory Visit: Payer: Self-pay | Admitting: Family

## 2024-02-11 ENCOUNTER — Other Ambulatory Visit: Payer: Self-pay | Admitting: Internal Medicine

## 2024-02-11 DIAGNOSIS — I5022 Chronic systolic (congestive) heart failure: Secondary | ICD-10-CM

## 2024-02-11 MED ORDER — NEBIVOLOL HCL 5 MG PO TABS
5.0000 mg | ORAL_TABLET | Freq: Every day | ORAL | 1 refills | Status: AC
  Filled 2024-02-11: qty 90, 90d supply, fill #0

## 2024-02-11 MED ORDER — POTASSIUM CHLORIDE CRYS ER 20 MEQ PO TBCR
20.0000 meq | EXTENDED_RELEASE_TABLET | Freq: Two times a day (BID) | ORAL | 4 refills | Status: AC
  Filled 2024-02-11: qty 180, 90d supply, fill #0

## 2024-02-11 MED ORDER — LOSARTAN POTASSIUM 100 MG PO TABS
100.0000 mg | ORAL_TABLET | Freq: Every day | ORAL | 4 refills | Status: AC
  Filled 2024-02-11: qty 90, 90d supply, fill #0

## 2024-02-11 MED FILL — Hydralazine HCl Tab 25 MG: ORAL | 30 days supply | Qty: 60 | Fill #3 | Status: AC

## 2024-02-11 MED FILL — "Insulin Pen Needle 30 G X 5 MM (1/5"" or 3/16"")": 100 days supply | Qty: 100 | Fill #2 | Status: CN

## 2024-02-12 ENCOUNTER — Other Ambulatory Visit: Payer: Self-pay

## 2024-02-16 MED FILL — "Insulin Pen Needle 30 G X 5 MM (1/5"" or 3/16"")": 100 days supply | Qty: 100 | Fill #2 | Status: CN

## 2024-02-17 ENCOUNTER — Other Ambulatory Visit: Payer: Self-pay

## 2024-02-17 MED FILL — "Insulin Pen Needle 30 G X 5 MM (1/5"" or 3/16"")": 100 days supply | Qty: 100 | Fill #2 | Status: AC

## 2024-02-18 ENCOUNTER — Other Ambulatory Visit: Payer: Self-pay

## 2024-02-19 ENCOUNTER — Other Ambulatory Visit: Payer: Self-pay

## 2024-03-23 ENCOUNTER — Encounter: Admitting: Dietician

## 2024-05-14 ENCOUNTER — Ambulatory Visit: Admitting: Family Medicine

## 2024-06-04 ENCOUNTER — Ambulatory Visit

## 2024-07-27 ENCOUNTER — Ambulatory Visit: Admitting: Family

## 2024-08-19 ENCOUNTER — Ambulatory Visit: Admitting: Neurology
